# Patient Record
Sex: Male | Born: 1955 | Race: Black or African American | Hispanic: No | Smoking: Former smoker
Health system: Southern US, Community
[De-identification: ages and names within clinical notes are randomized; demographics above are authoritative.]

## PROBLEM LIST (undated history)

## (undated) DIAGNOSIS — Z923 Personal history of irradiation: Secondary | ICD-10-CM

## (undated) DIAGNOSIS — E669 Obesity, unspecified: Secondary | ICD-10-CM

## (undated) DIAGNOSIS — C099 Malignant neoplasm of tonsil, unspecified: Secondary | ICD-10-CM

## (undated) DIAGNOSIS — E7800 Pure hypercholesterolemia, unspecified: Secondary | ICD-10-CM

## (undated) DIAGNOSIS — R011 Cardiac murmur, unspecified: Secondary | ICD-10-CM

## (undated) DIAGNOSIS — N529 Male erectile dysfunction, unspecified: Secondary | ICD-10-CM

## (undated) DIAGNOSIS — I509 Heart failure, unspecified: Secondary | ICD-10-CM

## (undated) DIAGNOSIS — Z8739 Personal history of other diseases of the musculoskeletal system and connective tissue: Secondary | ICD-10-CM

## (undated) DIAGNOSIS — C159 Malignant neoplasm of esophagus, unspecified: Secondary | ICD-10-CM

## (undated) DIAGNOSIS — E119 Type 2 diabetes mellitus without complications: Secondary | ICD-10-CM

## (undated) DIAGNOSIS — R1310 Dysphagia, unspecified: Secondary | ICD-10-CM

## (undated) DIAGNOSIS — M1990 Unspecified osteoarthritis, unspecified site: Secondary | ICD-10-CM

## (undated) DIAGNOSIS — T783XXA Angioneurotic edema, initial encounter: Secondary | ICD-10-CM

## (undated) DIAGNOSIS — K922 Gastrointestinal hemorrhage, unspecified: Secondary | ICD-10-CM

## (undated) DIAGNOSIS — G4733 Obstructive sleep apnea (adult) (pediatric): Secondary | ICD-10-CM

## (undated) DIAGNOSIS — Z9989 Dependence on other enabling machines and devices: Secondary | ICD-10-CM

## (undated) DIAGNOSIS — I10 Essential (primary) hypertension: Secondary | ICD-10-CM

## (undated) HISTORY — DX: Cardiac murmur, unspecified: R01.1

## (undated) HISTORY — DX: Pure hypercholesterolemia, unspecified: E78.00

## (undated) HISTORY — DX: Male erectile dysfunction, unspecified: N52.9

## (undated) HISTORY — DX: Type 2 diabetes mellitus without complications: E11.9

## (undated) HISTORY — DX: Obesity, unspecified: E66.9

## (undated) HISTORY — DX: Heart failure, unspecified: I50.9

## (undated) HISTORY — PX: MULTIPLE TOOTH EXTRACTIONS: SHX2053

## (undated) HISTORY — PX: COLONOSCOPY W/ BIOPSIES AND POLYPECTOMY: SHX1376

## (undated) HISTORY — PX: KNEE ARTHROSCOPY: SHX127

---

## 2010-12-05 ENCOUNTER — Ambulatory Visit: Payer: Self-pay | Admitting: *Deleted

## 2010-12-31 ENCOUNTER — Encounter (HOSPITAL_COMMUNITY)
Admission: RE | Disposition: A | Discharge status: Home / Self Care | Payer: Self-pay | Source: Ambulatory Visit | Attending: Cardiology

## 2010-12-31 ENCOUNTER — Ambulatory Visit (HOSPITAL_COMMUNITY)
Admission: RE | Admit: 2010-12-31 | Discharge: 2010-12-31 | Disposition: A | Discharge status: Home / Self Care | Payer: Medicare Other | Source: Ambulatory Visit | Attending: Cardiology | Admitting: Cardiology

## 2010-12-31 ENCOUNTER — Encounter (HOSPITAL_COMMUNITY): Payer: Self-pay

## 2010-12-31 DIAGNOSIS — I2789 Other specified pulmonary heart diseases: Secondary | ICD-10-CM | POA: Insufficient documentation

## 2010-12-31 DIAGNOSIS — I10 Essential (primary) hypertension: Secondary | ICD-10-CM | POA: Insufficient documentation

## 2010-12-31 DIAGNOSIS — R0602 Shortness of breath: Secondary | ICD-10-CM | POA: Insufficient documentation

## 2010-12-31 DIAGNOSIS — F17200 Nicotine dependence, unspecified, uncomplicated: Secondary | ICD-10-CM | POA: Insufficient documentation

## 2010-12-31 HISTORY — PX: LEFT AND RIGHT HEART CATHETERIZATION WITH CORONARY/GRAFT ANGIOGRAM: SHX5448

## 2010-12-31 LAB — POCT I-STAT 3, ART BLOOD GAS (G3+)
pCO2 arterial: 47.8 mmHg — ABNORMAL HIGH (ref 35.0–45.0)
pH, Arterial: 7.382 (ref 7.350–7.450)
pO2, Arterial: 56 mmHg — ABNORMAL LOW (ref 80.0–100.0)

## 2010-12-31 LAB — POCT I-STAT 3, VENOUS BLOOD GAS (G3P V)
Acid-Base Excess: 3 mmol/L — ABNORMAL HIGH (ref 0.0–2.0)
O2 Saturation: 60 %
pCO2, Ven: 52.8 mmHg — ABNORMAL HIGH (ref 45.0–50.0)

## 2010-12-31 SURGERY — LEFT AND RIGHT HEART CATHETERIZATION WITH CORONARY/GRAFT ANGIOGRAM
Anesthesia: LOCAL

## 2010-12-31 MED ORDER — NITROGLYCERIN 0.2 MG/ML ON CALL CATH LAB
INTRAVENOUS | Status: AC
  Filled 2010-12-31: qty 1

## 2010-12-31 MED ORDER — SODIUM CHLORIDE 0.9 % IV SOLN
1.0000 mL/kg/h | INTRAVENOUS | Status: DC
Start: 2010-12-31 — End: 2010-12-31

## 2010-12-31 MED ORDER — ASPIRIN 81 MG PO CHEW
324.0000 mg | CHEWABLE_TABLET | ORAL | Status: AC
  Administered 2010-12-31: 324 mg via ORAL

## 2010-12-31 MED ORDER — VERAPAMIL HCL 2.5 MG/ML IV SOLN
INTRAVENOUS | Status: AC
  Filled 2010-12-31: qty 2

## 2010-12-31 MED ORDER — HYDROMORPHONE HCL PF 2 MG/ML IJ SOLN
INTRAMUSCULAR | Status: AC
  Filled 2010-12-31: qty 1

## 2010-12-31 MED ORDER — SODIUM CHLORIDE 0.9 % IV SOLN
250.0000 mL | INTRAVENOUS | Status: DC
  Administered 2010-12-31: 1 mL/kg/h via INTRAVENOUS

## 2010-12-31 MED ORDER — MIDAZOLAM HCL 2 MG/2ML IJ SOLN
INTRAMUSCULAR | Status: AC
  Filled 2010-12-31: qty 2

## 2010-12-31 MED ORDER — ONDANSETRON HCL 4 MG/2ML IJ SOLN: 4.0000 mg | Freq: Four times a day (QID) | INTRAMUSCULAR | Status: DC | PRN

## 2010-12-31 MED ORDER — LIDOCAINE HCL (PF) 1 % IJ SOLN
INTRAMUSCULAR | Status: AC
  Filled 2010-12-31: qty 30

## 2010-12-31 MED ORDER — SODIUM CHLORIDE 0.9 % IJ SOLN: 3.0000 mL | INTRAMUSCULAR | Status: DC | PRN

## 2010-12-31 MED ORDER — SODIUM CHLORIDE 0.9 % IV SOLN
250.0000 mL | INTRAVENOUS | Status: DC
Start: 2010-12-31 — End: 2010-12-31

## 2010-12-31 MED ORDER — HEPARIN (PORCINE) IN NACL 2-0.9 UNIT/ML-% IJ SOLN
INTRAMUSCULAR | Status: AC
  Filled 2010-12-31: qty 2000

## 2010-12-31 MED ORDER — ACETAMINOPHEN 325 MG PO TABS: 650.0000 mg | ORAL_TABLET | ORAL | Status: DC | PRN

## 2010-12-31 MED ORDER — CEFAZOLIN SODIUM 1-5 GM-% IV SOLN
1.0000 g | INTRAVENOUS | Status: DC
  Filled 2010-12-31: qty 50

## 2010-12-31 MED ORDER — SODIUM CHLORIDE 0.9 % IJ SOLN: 3.0000 mL | Freq: Two times a day (BID) | INTRAMUSCULAR | Status: DC

## 2010-12-31 MED ORDER — ASPIRIN 81 MG PO CHEW
CHEWABLE_TABLET | ORAL | Status: AC
  Filled 2010-12-31: qty 4

## 2010-12-31 NOTE — Progress Notes (Signed)
Pt unable to find anyone to stay overnight with him. Dr Jacinto Halim made aware and OK to discharge as planned per Dr Jacinto Halim.

## 2010-12-31 NOTE — H&P (Signed)
  Date of Initial H&P: 10/20/2010  History reviewed, patient examined, no change in status, stable for surgery. H&P will be scanned into the system.  Delrae Rend R 12/31/2010

## 2010-12-31 NOTE — Brief Op Note (Signed)
12/31/2010  8:57 AM  PATIENT:  Melvin Hudson  55 y.o. male  PRE-OPERATIVE DIAGNOSIS:  chest pain  POST-OPERATIVE DIAGNOSIS: Microvascular angina. No significant CAD. Mild pulmonary hypertension PROCEDURE:  Procedure(s): LEFT AND RIGHT HEART CATHETERIZATION WITH CORONARY/GRAFT ANGIOGRAM  Cardiologist: Jeanella Cara, MD, [facility].:         DICTATION: .Other Dictation: Dictation Number [id number]   PATIENT DISPOSITION:  Short Stay

## 2010-12-31 NOTE — Cardiovascular Report (Signed)
Melvin Hudson, Melvin Hudson              ACCOUNT NO.:  [id number]  MEDICAL RECORD NO.:  [id number]  LOCATION:  [facility]                         FACILITY:  [facility]  PHYSICIAN:  Pamella Pert, MD DATE OF BIRTH:  [birth year]  DATE OF PROCEDURE: DATE OF DISCHARGE:                           CARDIAC CATHETERIZATION   PROCEDURE PERFORMED: 1. Cardiac catheterization including left heart catheterization. 2. Left ventriculography and selective right and left coronary     angiography. 3. Right heart catheterization with calculation of cardiac output and     cardiac index by Fick.  INDICATIONS:  Mr. Melvin Hudson is a 55 year old gentleman with a history of hypertension, morbid obesity, smoking who has been complaining of shortness of breath and dyspnea on exertion, had undergone outpatient stress testing.  Cardiopulmonary stress testing had revealed cardiac dysfunction with increasing workload.  Given abnormal stress test and the patient's preference, he was brought to the cardiac cath lab to evaluate his coronary anatomy.  Also his right heart catheterization is being performed to evaluate for pulmonary hypertension.  HEMODYNAMIC DATA: 1. Right heart catheterization.     a.     RA pressure of 13/14 with a mean of 10 mmHg.     b.     RV pressure 31/0 with end-diastolic pressure of 0 mmHg.     c.     PA pressure was 34/0 with a mean of 25 mmHg.     d.     Pulmonary capillary wedge pressure was 70/50 with a mean of      14 mmHg.  Cardiac output by Fick was 6.38 with a cardiac index of 2.35.  1. Left heart catheterization.     a.     Left ventricular pressure was 127/18 mmHg.  Aortic pressure      was 114/82 with a mean of 76 mmHg.  There is no pressure gradient      across the aortic valve.  ANGIOGRAPHIC DATA:  Left ventricle.  Left ventricular systolic function was normal with an ejection fraction of 55% to 60%.  Right coronary artery.  Right coronary is a very large caliber vessel and a  dominant vessel.  There is slow filling noted, which improved with intracoronary nitroglycerin administration.  There is no stenosis evident.  Left main coronary artery.  Left main coronary artery is a large-caliber vessel.  It is smooth and normal.  LAD:  LAD is a large-caliber vessel giving origin to several small to moderate sized diagonals which are smooth and normal.  Slow filling again was noted in the LAD.  Circumflex:  Circumflex is a very large-caliber vessel.  It gives origin to 2 large obtuse marginals.  The circumflex itself has very slow filling noted without any significant luminal obstruction.  IMPRESSION: 1. Normal left ventricular systolic function.  Ejection fraction 55%     to 60%. 2. Mild-to-moderate elevation left ventricular end-diastolic pressure     suggestive of diastolic dysfunction. 3. Normal coronary arteries; however, slow filling was evident     suggestive of microvascular disease. 4. Mild pulmonary hypertension.  RECOMMENDATION:  The patient's risk-factor modification including weight loss.  His oxygen saturation was also low at room air at 88%.  He needs to be evaluated for sleep apnea.  He will be discharged home today with outpatient followup.  TECHNIQUE OF PROCEDURE:  Under sterile precautions using a 6-French right radial access and a 5-French right antecubital vein access, I attempted to perform left heart catheterization via radial access. Because of significant tortuosity with double curves into the origin of the subclavian artery and also the arch of the aorta, we decided to abandon this procedure and go via right femoral arterial access.  Right femoral arterial access was obtained using ultrasound guidance.  For performing left heart catheterization, 6-French multipurpose B2 catheter was advanced to the ascending aorta and left ventricle.  Left ventriculography was performed in the RAO projection.  Catheter pulled into the ascending  aorta.  Right coronary artery was selectively engaged and angiography was performed and 200 mcg intracoronary nitroglycerin was administered.  Angiography was repeated.  Catheter then exchanged out to a 6-French Judkins, left 5 diagnostic catheter, and angiography was performed.  The catheter then pulled out of body over a J-wire.  The access on the right side was closed using Angio-Seal with excellent hemostasis.  TECHNIQUE OF RIGHT HEART CATHETERIZATION:  Using antecubital vein access, I was able to place a 5-French brachial sheath.  A 5-French balloon-tipped catheter was easily advanced into the pulmonary capillary wedge position.  Right heart catheterization was performed with careful analysis of the waveforms.  Cardiac output and index was calculated by using Fick.  Then the catheter was pulled out of body in usual fashion. Hemostasis on the right antecubital fossa was obtained by applying manual pressure and the right radial artery.  Hemostasis was obtained by applying TR band.  The patient tolerated the procedure well.  No immediate complications.     Pamella Pert, MD     JRG/MEDQ  D:  12/31/2010  T:  12/31/2010  Job:  [id number]

## 2011-09-17 ENCOUNTER — Encounter (HOSPITAL_COMMUNITY): Payer: Self-pay | Admitting: *Deleted

## 2011-09-17 ENCOUNTER — Observation Stay (HOSPITAL_COMMUNITY)
Admission: EM | Admit: 2011-09-17 | Discharge: 2011-09-18 | Disposition: A | Discharge status: Home / Self Care | Payer: Medicare Other | Attending: Internal Medicine | Admitting: Internal Medicine

## 2011-09-17 DIAGNOSIS — I10 Essential (primary) hypertension: Secondary | ICD-10-CM | POA: Diagnosis not present

## 2011-09-17 DIAGNOSIS — M1710 Unilateral primary osteoarthritis, unspecified knee: Secondary | ICD-10-CM | POA: Insufficient documentation

## 2011-09-17 DIAGNOSIS — Z720 Tobacco use: Secondary | ICD-10-CM | POA: Diagnosis present

## 2011-09-17 DIAGNOSIS — T465X5A Adverse effect of other antihypertensive drugs, initial encounter: Secondary | ICD-10-CM | POA: Insufficient documentation

## 2011-09-17 DIAGNOSIS — M109 Gout, unspecified: Secondary | ICD-10-CM | POA: Diagnosis not present

## 2011-09-17 DIAGNOSIS — F17200 Nicotine dependence, unspecified, uncomplicated: Secondary | ICD-10-CM | POA: Insufficient documentation

## 2011-09-17 DIAGNOSIS — T783XXA Angioneurotic edema, initial encounter: Principal | ICD-10-CM | POA: Diagnosis present

## 2011-09-17 DIAGNOSIS — M19079 Primary osteoarthritis, unspecified ankle and foot: Secondary | ICD-10-CM | POA: Insufficient documentation

## 2011-09-17 DIAGNOSIS — I959 Hypotension, unspecified: Secondary | ICD-10-CM | POA: Diagnosis present

## 2011-09-17 HISTORY — DX: Angioneurotic edema, initial encounter: T78.3XXA

## 2011-09-17 HISTORY — DX: Essential (primary) hypertension: I10

## 2011-09-17 HISTORY — DX: Unspecified osteoarthritis, unspecified site: M19.90

## 2011-09-17 LAB — POCT I-STAT, CHEM 8
BUN: 14 mg/dL (ref 6–23)
Creatinine, Ser: 1.2 mg/dL (ref 0.50–1.35)
Glucose, Bld: 167 mg/dL — ABNORMAL HIGH (ref 70–99)
Hemoglobin: 17.3 g/dL — ABNORMAL HIGH (ref 13.0–17.0)
TCO2: 25 mmol/L (ref 0–100)

## 2011-09-17 LAB — CBC WITH DIFFERENTIAL/PLATELET
Eosinophils Absolute: 0 10*3/uL (ref 0.0–0.7)
Eosinophils Relative: 0 % (ref 0–5)
HCT: 48.6 % (ref 39.0–52.0)
Hemoglobin: 17.6 g/dL — ABNORMAL HIGH (ref 13.0–17.0)
Lymphocytes Relative: 17 % (ref 12–46)
Lymphs Abs: 2 10*3/uL (ref 0.7–4.0)
MCH: 30.2 pg (ref 26.0–34.0)
MCV: 83.4 fL (ref 78.0–100.0)
Monocytes Relative: 4 % (ref 3–12)
RBC: 5.83 MIL/uL — ABNORMAL HIGH (ref 4.22–5.81)
WBC: 11.8 10*3/uL — ABNORMAL HIGH (ref 4.0–10.5)

## 2011-09-17 LAB — MRSA PCR SCREENING: MRSA by PCR: NEGATIVE

## 2011-09-17 LAB — POCT I-STAT TROPONIN I: Troponin i, poc: 0.01 ng/mL (ref 0.00–0.08)

## 2011-09-17 MED ORDER — SODIUM CHLORIDE 0.9 % IJ SOLN
3.0000 mL | Freq: Two times a day (BID) | INTRAMUSCULAR | Status: DC
  Administered 2011-09-17: 3 mL via INTRAVENOUS

## 2011-09-17 MED ORDER — SODIUM CHLORIDE 0.9 % IV SOLN
INTRAVENOUS | Status: AC
  Administered 2011-09-17: 19:00:00 via INTRAVENOUS

## 2011-09-17 MED ORDER — OXYCODONE HCL 5 MG PO TABS: 5.0000 mg | ORAL_TABLET | ORAL | Status: DC | PRN

## 2011-09-17 MED ORDER — SODIUM CHLORIDE 0.9 % IJ SOLN: 3.0000 mL | Freq: Two times a day (BID) | INTRAMUSCULAR | Status: DC

## 2011-09-17 MED ORDER — SODIUM CHLORIDE 0.9 % IJ SOLN: 3.0000 mL | INTRAMUSCULAR | Status: DC | PRN

## 2011-09-17 MED ORDER — FAMOTIDINE IN NACL 20-0.9 MG/50ML-% IV SOLN
20.0000 mg | Freq: Once | INTRAVENOUS | Status: AC
  Administered 2011-09-17: 20 mg via INTRAVENOUS
  Filled 2011-09-17: qty 50

## 2011-09-17 MED ORDER — ACETAMINOPHEN 650 MG RE SUPP: 650.0000 mg | Freq: Four times a day (QID) | RECTAL | Status: DC | PRN

## 2011-09-17 MED ORDER — ONDANSETRON HCL 4 MG PO TABS: 4.0000 mg | ORAL_TABLET | Freq: Four times a day (QID) | ORAL | Status: DC | PRN

## 2011-09-17 MED ORDER — SODIUM CHLORIDE 0.9 % IV SOLN: INTRAVENOUS | Status: DC

## 2011-09-17 MED ORDER — FAMOTIDINE IN NACL 20-0.9 MG/50ML-% IV SOLN
20.0000 mg | Freq: Two times a day (BID) | INTRAVENOUS | Status: DC
  Administered 2011-09-17 – 2011-09-18 (×2): 20 mg via INTRAVENOUS
  Filled 2011-09-17 (×3): qty 50

## 2011-09-17 MED ORDER — METHYLPREDNISOLONE SODIUM SUCC 125 MG IJ SOLR
60.0000 mg | Freq: Four times a day (QID) | INTRAMUSCULAR | Status: DC
  Administered 2011-09-17 – 2011-09-18 (×4): 60 mg via INTRAVENOUS
  Filled 2011-09-17 (×2): qty 2
  Filled 2011-09-17 (×3): qty 0.96
  Filled 2011-09-17: qty 2
  Filled 2011-09-17: qty 0.96

## 2011-09-17 MED ORDER — SODIUM CHLORIDE 0.9 % IV BOLUS (SEPSIS)
500.0000 mL | Freq: Once | INTRAVENOUS | Status: AC
  Administered 2011-09-17: 500 mL via INTRAVENOUS

## 2011-09-17 MED ORDER — DEXAMETHASONE SODIUM PHOSPHATE 10 MG/ML IJ SOLN
10.0000 mg | Freq: Once | INTRAMUSCULAR | Status: AC
  Administered 2011-09-17: 10 mg via INTRAVENOUS
  Filled 2011-09-17: qty 1

## 2011-09-17 MED ORDER — DIPHENHYDRAMINE HCL 50 MG/ML IJ SOLN
12.5000 mg | Freq: Four times a day (QID) | INTRAMUSCULAR | Status: DC
  Administered 2011-09-17 – 2011-09-18 (×4): 12.5 mg via INTRAVENOUS
  Filled 2011-09-17: qty 0.25
  Filled 2011-09-17 (×2): qty 1
  Filled 2011-09-17 (×3): qty 0.25
  Filled 2011-09-17: qty 1

## 2011-09-17 MED ORDER — SODIUM CHLORIDE 0.9 % IV SOLN
INTRAVENOUS | Status: DC
  Administered 2011-09-17: 21:00:00 via INTRAVENOUS

## 2011-09-17 MED ORDER — SODIUM CHLORIDE 0.9 % IV SOLN: 250.0000 mL | INTRAVENOUS | Status: DC | PRN

## 2011-09-17 MED ORDER — ACETAMINOPHEN 325 MG PO TABS: 650.0000 mg | ORAL_TABLET | Freq: Four times a day (QID) | ORAL | Status: DC | PRN

## 2011-09-17 MED ORDER — ALBUTEROL SULFATE (5 MG/ML) 0.5% IN NEBU: 2.5000 mg | INHALATION_SOLUTION | RESPIRATORY_TRACT | Status: DC | PRN

## 2011-09-17 MED ORDER — EPINEPHRINE 0.3 MG/0.3ML IJ DEVI
0.3000 mg | Freq: Once | INTRAMUSCULAR | Status: AC
  Administered 2011-09-17: 0.3 mg via SUBCUTANEOUS
  Filled 2011-09-17: qty 0.3

## 2011-09-17 MED ORDER — MORPHINE SULFATE 2 MG/ML IJ SOLN: 2.0000 mg | INTRAMUSCULAR | Status: DC | PRN

## 2011-09-17 MED ORDER — ONDANSETRON HCL 4 MG/2ML IJ SOLN: 4.0000 mg | Freq: Four times a day (QID) | INTRAMUSCULAR | Status: DC | PRN

## 2011-09-17 NOTE — ED Provider Notes (Signed)
History     CSN: [id number]  Arrival date & time 09/17/11  1112   First MD Initiated Contact with Patient 09/17/11 1115      Chief Complaint  Patient presents with  . Allergic Reaction    (Consider location/radiation/quality/duration/timing/severity/associated sxs/prior treatment) HPI Comments: Melvin Hudson presents for evaluation of lip and tongue swelling and chest tightness when  he woke with this am.  He was walking into his cardiologist's office this morning for a routine visit today when he became lightheaded and started to feel tighter in his chest,  He became diaphoretic and reports he almost passed out.  His blood pressure was found to be 70/40 and was given a 400 ml bolus of fluid along with benadryl and aspirin 324 mg  prior to arrival here by ems.  He denies shortness of breath and chest pain at the time of his initial evaluation here.  He also states he lip and tongue swelling may be slightly better than when he first woke today.  He does state he was switched to a new bp medicine edarbyclor about 1 month ago.  He denies any other new exposures to medicines.    The history is provided by the patient.    Past Medical History  Diagnosis Date  . Hypertension   . Anginal pain   . Angioedema 09/17/11    tongue and lips  . Arthritis     "both of my legs and feet"  . History of gout     Past Surgical History  Procedure Date  . Knee arthroscopy ~ 1994    right  . Multiple tooth extractions ~ 2008    History reviewed. No pertinent family history.  History  Substance Use Topics  . Smoking status: Current Everyday Smoker -- 40 years    Types: Cigars  . Smokeless tobacco: Not on file  . 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  HENT: Negative for congestion, sore throat, drooling, trouble swallowing, neck pain and voice change.   Eyes: Negative.   Respiratory: Negative for chest tightness,  shortness of breath, wheezing and stridor.   Cardiovascular: Negative for chest pain.  Gastrointestinal: Negative for nausea and abdominal pain.  Musculoskeletal: Negative for joint swelling and arthralgias.  Skin: Negative.  Negative for rash and wound.  Neurological: Negative for dizziness, weakness, light-headedness, numbness and headaches.  Hematological: Negative.   Psychiatric/Behavioral: Negative.     Allergies  Advil  Home Medications   Current Outpatient Rx  Name Route Sig Dispense Refill  . AMLODIPINE BESYLATE 10 MG PO TABS Oral Take 10 mg by mouth daily.    . ASPIRIN EC 81 MG PO TBEC Oral Take 81 mg by mouth daily.      . AZILSARTAN-CHLORTHALIDONE 40-25 MG PO TABS Oral Take by mouth.      BP 121/77  Pulse 79  Temp 98.7 F (37.1 C) (Oral)  Resp 22  SpO2 98%  Physical Exam  Nursing note and vitals reviewed. Constitutional: He appears well-developed and well-nourished.  HENT:  Head: Normocephalic and atraumatic.  Nose: No mucosal edema.       Tongue and lower lip edema with no involvement of uvula or soft palate.    Eyes: Conjunctivae are normal.  Neck: Normal range of motion.  Cardiovascular: Normal rate, regular rhythm, normal heart sounds and intact distal pulses.   Pulmonary/Chest: Effort normal and breath sounds normal.  No stridor. No respiratory distress. He has no wheezes. He has no rales.  Abdominal: Soft. Bowel sounds are normal. There is no tenderness.  Musculoskeletal: Normal range of motion. He exhibits no edema.  Neurological: He is alert.  Skin: Skin is warm and dry.  Psychiatric: He has a normal mood and affect.    ED Course  Procedures (including critical care time)  Labs Reviewed  CBC WITH DIFFERENTIAL - Abnormal; Notable for the following:    WBC 11.8 (*)     RBC 5.83 (*)     Hemoglobin 17.6 (*)     MCHC 36.2 (*)     RDW 19.3 (*)     Neutrophils Relative 79 (*)     Neutro Abs 9.3 (*)     All other components within normal limits    POCT I-STAT, CHEM 8 - Abnormal; Notable for the following:    Glucose, Bld 167 (*)     Hemoglobin 17.3 (*)     All other components within normal limits  POCT I-STAT TROPONIN I   No results found.   1. Angioedema of lips       MDM  Angioedema of unclear etiology.  Pt also seen by Dr. Ignacia Palma and admission for continued observation arranged.        Burgess Amor, [place] 09/17/11 [phone]

## 2011-09-17 NOTE — Progress Notes (Addendum)
Melvin Hudson, is a 56 y.o. male,   MRN: [id number]  -  DOB - 06/25/1955  Outpatient Primary MD for the patient is OSEI-BONSU,GEORGE, MD Cardiologist:  Dr. Evlyn Courier  Chief Complaint:    Chief Complaint  Patient presents with  . Allergic Reaction     Blood pressure 103/63, pulse 71, temperature 97.9 F (36.6 C), temperature source Oral, resp. rate 22, SpO2 94.00%.  Active Problems:  Angio-edema  Dehydration  hypotension    [name] was sent to the ED from Dr. Jodi Marble office after a presyncopal episode he was found to have SBP of 60.  He reports that Dr. Reece Agar put him on new medications 28 days ago (he can't tell me the name of the medications). Per the EDP he received a new ARB medication.   He was in Dr. Timoteo Expose office this morning for a follow up.  This morning the patient developed a rash on his right arm with itching on his arm and neck.  He felt well before this morning.  In the ED the patient has received fluids, decadron, epinephrine and pecid.  His blood pressure has improved but is still soft.  He is awake and alert but his tongue is very swollen.  I have requested and observation admission in a step down bed.    Of note Hgb, HCT, and Creatinine are elevated.  I will continue IVF for dehydration.   Algis Downs, PA-C [facility] Pager: [phone]

## 2011-09-17 NOTE — ED Notes (Signed)
Per EMS- pt was at pcp office for a follow up visit and EMS was called out. Pt was pale diaphoretic itching and reported tongue swelling. Pt reported tightness in chest as well. BP 70/40 initially hr 82 resp 18. 400 ml bolus of NS. BP100/70. 50 mg benadryl PO given. Denies itching. Tightness in chest has also decreased from an 8 to 2. Pt also received 324 of Asprin en route. 18g in left hand

## 2011-09-17 NOTE — H&P (Signed)
PCP:   Jackie Plum, MD   Chief Complaint:  Pruritus and swelling of lips/tongue.  HPI: This is a 56 year old male, with known history of morbid obesity, cigar smoking, HTN, angioedema in childhood due to a "fish sting", gout, arthritis, s/p post right knee arthroscopy, s/p cardiac catheterization 12/31/2010, for chest pain which revealed no  significant CAD, but mild pulmonary hypertension. According to patient, he was placed on 2 new antihypertensives by Dr Jacinto Halim, approximately 28 days ago, and has been compliant, without any adverse effects. This AM, he got ready for a routine follow up visit with Dr Jacinto Halim, felt fine, took his medication at about 9:00 AM, then ate a banana, took some vinegar and waited for his ride. Soon after, he became itchy in both arms and neck, and his face felt "numb" and his lips started swelling. He had no difficulty swallowing, and was able to make it to Dr Verl Dicker office. On getting out of the care, he became very dizzy, managed to get into the elevator, but as soon as he arrived at the office on the 3rd floor, he had to sit in a chair, and ask for help. His SBP was found to be in the 60s, and he was sent to the ED.  In the ED the patient received fluids, decadron, epinephrine and pecid.     Allergies:   Allergies  Allergen Reactions  . Advil (Ibuprofen)     Feels "jittery"      Past Medical History  Diagnosis Date  . Hypertension   . Anginal pain   . Angioedema 09/17/11    tongue and lips  . Arthritis     "both of my legs and feet"  . History of gout     Past Surgical History  Procedure Date  . Knee arthroscopy ~ 1994    right  . Multiple tooth extractions ~ 2008    Prior to Admission medications   Medication Sig Start Date End Date Taking? Authorizing Provider  amLODipine (NORVASC) 10 MG tablet Take 10 mg by mouth daily.   Yes Historical Provider, MD  aspirin EC 81 MG tablet Take 81 mg by mouth daily.     Yes Historical Provider, MD    Azilsartan-Chlorthalidone (EDARBYCLOR) 40-25 MG TABS Take by mouth.   Yes Historical Provider, MD    Social History: Patient is single, has no offspring, and is on disability. He reports that he has been smoking Cigars about 2/day, since age 81 years.  He does not have any smokeless tobacco history on file. Denies alcohol use. He reports that he does not use illicit drugs.  History reviewed. No pertinent family history.  Review of Systems:  As per HPI and chief complaint. Patent denies fatigue, diminished appetite, weight loss, fever, chills, headache, blurred vision, speech is a bit slurred, denies, dyspahgia, chest pain, cough, shortness of breath, orthopnea, paroxysmal nocturnal dyspnea, nausea, diaphoresis, abdominal pain, vomiting, diarrhea, belching, heartburn, hematemesis, melena, dysuria, nocturia, urinary frequency, hematochezia, lower extremity swelling, pain, or redness. The rest of the systems review is negative.  Physical Exam:  General:  Patient does not appear to be in obvious acute distress at the time of this evaluation, alert, communicative, fully oriented, talking in complete sentences, not short of breath at rest. Speech is mildly slurred. HEENT:  No clinical pallor, no jaundice, no conjunctival injection or discharge. Lips are visible swollen, tongue appears only mildly swollen, but does not seem to impede respiration or swallowing. Hydration status appears fair.  NECK:  Supple, JVP not seen, no carotid bruits, no palpable lymphadenopathy, no palpable goiter. CHEST:  Clinically clear to auscultation, no wheezes, no crackles. HEART:  Sounds 1 and 2 heard, normal, regular, no murmurs. ABDOMEN:  Morbidly obese, soft, non-tender, no palpable organomegaly, no palpable masses, normal bowel sounds. GENITALIA:  Not examined. LOWER EXTREMITIES:  No pitting edema, palpable peripheral pulses. MUSCULOSKELETAL SYSTEM:  Unremarkable. CENTRAL NERVOUS SYSTEM:  No focal neurologic deficit  on gross examination.  Labs on Admission:  Results for orders placed during the hospital encounter of 09/17/11 (from the past 48 hour(s))  CBC WITH DIFFERENTIAL     Status: Abnormal   Collection Time   09/17/11 12:42 PM      Component Value Range Comment   WBC 11.8 (*) 4.0 - 10.5 K/uL    RBC 5.83 (*) 4.22 - 5.81 MIL/uL    Hemoglobin 17.6 (*) 13.0 - 17.0 g/dL    HCT 47.8  29.5 - 62.1 %    MCV 83.4  78.0 - 100.0 fL    MCH 30.2  26.0 - 34.0 pg    MCHC 36.2 (*) 30.0 - 36.0 g/dL    RDW 30.8 (*) 65.7 - 15.5 %    Platelets 298  150 - 400 K/uL    Neutrophils Relative 79 (*) 43 - 77 %    Neutro Abs 9.3 (*) 1.7 - 7.7 K/uL    Lymphocytes Relative 17  12 - 46 %    Lymphs Abs 2.0  0.7 - 4.0 K/uL    Monocytes Relative 4  3 - 12 %    Monocytes Absolute 0.5  0.1 - 1.0 K/uL    Eosinophils Relative 0  0 - 5 %    Eosinophils Absolute 0.0  0.0 - 0.7 K/uL    Basophils Relative 0  0 - 1 %    Basophils Absolute 0.0  0.0 - 0.1 K/uL   POCT I-STAT TROPONIN I     Status: Normal   Collection Time   09/17/11 12:51 PM      Component Value Range Comment   Troponin i, poc 0.01  0.00 - 0.08 ng/mL    Comment 3            POCT I-STAT, CHEM 8     Status: Abnormal   Collection Time   09/17/11 12:52 PM      Component Value Range Comment   Sodium 142  135 - 145 mEq/L    Potassium 3.5  3.5 - 5.1 mEq/L    Chloride 102  96 - 112 mEq/L    BUN 14  6 - 23 mg/dL    Creatinine, Ser 8.46  0.50 - 1.35 mg/dL    Glucose, Bld 962 (*) 70 - 99 mg/dL    Calcium, Ion 9.52  8.41 - 1.23 mmol/L    TCO2 25  0 - 100 mmol/L    Hemoglobin 17.3 (*) 13.0 - 17.0 g/dL    HCT 32.4  40.1 - 02.7 %     Radiological Exams on Admission: No results found.  Assessment/Plan Active Problems: 1. Angio-edema: Patient presented with angioedema of lips and tongue, preceded by pruritus of arms and neck, after ingestion of his regular antihypertensives. The likely culprit is ARB, Azilsartan. This has been discontinued, and patient will be  cautioned to avoid ACE-i and ARBs, in the future. He appears to have responded to Decadron, epinephrine and Pecid, administered in the ED and lip swelling is already visibly less. There appears to  be no respiratory compromise at this time. We shall admit patient to SDU for close observation. He is tolerating liquids a tthis time, which we shall continue. Meanwhile, continue steroids, H2RA and antihistaminics.  2. Hypotension: This was likely due to peripheral vasodilatation, associated with a severe al;lergic reaction., as well as the super-added effect of antihypertensives and diuretic. BP is now normal at 116/70 in the ED, after saline bolus. Continue maintenance iv fluids, and hold antihypertensives.  3. History of HTN; see discussion above. 4. Gout: asymptomatic. 5. Tobacco abuse: counseled.    Time Spent on Admission: 45 mins.   Dorethia Jeanmarie,CHRISTOPHER 09/17/2011, 4:48 PM

## 2011-09-17 NOTE — ED Provider Notes (Signed)
Medical screening examination/treatment/procedure(s) were conducted as a shared visit with non-physician practitioner(s) and myself.  I personally evaluated the patient during the encounter 56 yo man had angioedema of tongue and lips and hypotension at Dr. Verl Dicker office. Exam showed the tongue and lips quite swollen, BP low. Given epinephrine, Benadryl, Solumedrol and Pepcid. BP is up and swelling is going down. Call to [facility], [facility]C for [facility], to admit to a telemetry bed to [facility].  Ultimately it was decided to admit pt to a stepdown bed, as discussed with Mrs. Elyn Peers.    Carleene Cooper III, MD 09/17/11 2013

## 2011-09-17 NOTE — ED Provider Notes (Signed)
11:48 AM  Date: 09/17/2011  Rate: 73  Rhythm: normal sinus rhythm  QRS Axis: left  Intervals: PR prolonged QRS:  Low voltage; early precordial R/S transition.  ST/T Wave abnormalities: normal  Conduction Disutrbances:none  Narrative Interpretation: Borderline EKG  Old EKG Reviewed: none available    Carleene Cooper III, MD 09/17/11 1149

## 2011-09-17 NOTE — Progress Notes (Signed)
56 yo man had angioedema of tongue and lips and hypotension at Dr. Verl Dicker office.  Exam showed the tongue and lips quite swollen, BP low.  Given epinephrine, Benadryl, Solumedrol and Pepcid.  BP is up and swelling is going down.  Call to [facility], [facility].-C for [facility], to admit to a telemetry bed to [facility].

## 2011-09-18 DIAGNOSIS — T783XXA Angioneurotic edema, initial encounter: Secondary | ICD-10-CM

## 2011-09-18 DIAGNOSIS — M109 Gout, unspecified: Secondary | ICD-10-CM

## 2011-09-18 DIAGNOSIS — F17200 Nicotine dependence, unspecified, uncomplicated: Secondary | ICD-10-CM

## 2011-09-18 DIAGNOSIS — I10 Essential (primary) hypertension: Secondary | ICD-10-CM

## 2011-09-18 LAB — CBC
Hemoglobin: 15.1 g/dL (ref 13.0–17.0)
MCH: 29.4 pg (ref 26.0–34.0)
RBC: 5.13 MIL/uL (ref 4.22–5.81)
WBC: 10.3 10*3/uL (ref 4.0–10.5)

## 2011-09-18 LAB — BASIC METABOLIC PANEL
GFR calc Af Amer: >90 mL/min (ref >90)
GFR calc non Af Amer: >90 mL/min (ref >90)
Glucose, Bld: 213 mg/dL — ABNORMAL HIGH (ref 70–99)
Potassium: 3.6 mEq/L (ref 3.5–5.1)
Sodium: 139 mEq/L (ref 135–145)

## 2011-09-18 MED ORDER — PREDNISONE (PAK) 10 MG PO TABS: 40.0000 mg | ORAL_TABLET | Freq: Every day | ORAL | Status: AC

## 2011-09-18 MED ORDER — FAMOTIDINE 20 MG PO TABS: 20.0000 mg | ORAL_TABLET | Freq: Two times a day (BID) | ORAL | Status: DC

## 2011-09-18 MED ORDER — SPIRONOLACTONE 25 MG PO TABS
25.0000 mg | ORAL_TABLET | Freq: Once | ORAL | Status: AC
  Administered 2011-09-18: 25 mg via ORAL
  Filled 2011-09-18: qty 1

## 2011-09-18 MED ORDER — DIPHENHYDRAMINE HCL 25 MG PO TABS: 25.0000 mg | ORAL_TABLET | Freq: Four times a day (QID) | ORAL | Status: DC | PRN

## 2011-09-18 MED ORDER — SPIRONOLACTONE 25 MG PO TABS: 25.0000 mg | ORAL_TABLET | Freq: Once | ORAL | Status: DC

## 2011-09-18 MED ORDER — AMLODIPINE BESYLATE 10 MG PO TABS
10.0000 mg | ORAL_TABLET | Freq: Every day | ORAL | Status: DC
  Administered 2011-09-18: 10 mg via ORAL
  Filled 2011-09-18: qty 1

## 2011-09-18 NOTE — Discharge Summary (Signed)
Physician Discharge Summary  Melvin Hudson XLK:[id number] DOB: Apr 08, 1955 DOA: 09/17/2011  PCP: Jackie Plum, MD  Admit date: 09/17/2011 Discharge date: 09/18/2011  Recommendations for Outpatient Follow-up:  F/u BP and bmet at Dr Verl Dicker or Dr Luna Kitchens office in 1 week  Discharge Diagnoses:  Active Problems:  Angio-edema and related hypotension   HTN (hypertension)  Gout  Tobacco abuse Morbid obesity   Discharge Condition: stable  Diet recommendation: low sodium, low fat  Wt Readings from Last 3 Encounters:  09/17/11 166.1 kg (366 lb 2.9 oz)  12/31/10 157.852 kg (348 lb)  12/31/10 157.852 kg (348 lb)    History of present illness:  This is a 56 year old male, with known history of morbid obesity, cigar smoking, HTN, angioedema in childhood due to a "fish sting", gout, arthritis, s/p post right knee arthroscopy, s/p cardiac catheterization 12/31/2010, for chest pain which revealed no significant CAD, but mild pulmonary hypertension. According to patient, he was placed on 2 new antihypertensives by Dr Jacinto Halim, approximately 28 days ago, and has been compliant, without any adverse effects. This AM, he got ready for a routine follow up visit with Dr Jacinto Halim, felt fine, took his medication at about 9:00 AM, then ate a banana, took some vinegar and waited for his ride. Soon after, he became itchy in both arms and neck, and his face felt "numb" and his lips started swelling. He had no difficulty swallowing, and was able to make it to Dr Verl Dicker office. On getting out of the care, he became very dizzy, managed to get into the elevator, but as soon as he arrived at the office on the 3rd floor, he had to sit in a chair, and ask for help. His SBP was found to be in the 60s, and he was sent to the ED.  In the ED the patient received fluids, decadron, epinephrine and pecid.   Hospital Course:  Angioedema Improved significantly with Steroids, Pepcid and Benadryl. Will give him 3 more days of  these. He is to no longer take ACE I or ARBs.  HTN-  Initially hypotenisve on admission likely anaphylaxis. BP up now likely from steroids, IVF and holding his BP meds. Will resume Norvasc and start Spironolactone per Dr Verl Dicker recommendations. He will follow up with the patient later in the week.   Discharge Exam: Filed Vitals:   09/18/11 1232  BP: 163/82  Pulse: 93  Temp: 98.9 F (37.2 C)  Resp: 22   Filed Vitals:   09/18/11 0500 09/18/11 0600 09/18/11 0804 09/18/11 1232  BP: 144/87 134/80 143/86 163/82  Pulse: 89 86 97 93  Temp:   98.4 F (36.9 C) 98.9 F (37.2 C)  TempSrc:   Oral Oral  Resp: 18 22 20 22   Height:      Weight:      SpO2:   95% 95%    General: morbidly obese, no acute distress Cardiovascular: RRR, unable to hear any murmurs Respiratory: decreased breath sounds but no crackles, ronchi or wheeze  Discharge Instructions   Medication List  As of 09/18/2011  4:12 PM   STOP taking these medications         EDARBYCLOR 40-25 MG Tabs         TAKE these medications         amLODipine 10 MG tablet   Commonly known as: NORVASC   Take 10 mg by mouth daily.      aspirin EC 81 MG tablet   Take 81 mg by mouth  daily.      diphenhydrAMINE 25 MG tablet   Commonly known as: BENADRYL   Take 1 tablet (25 mg total) by mouth every 6 (six) hours as needed for itching.      famotidine 20 MG tablet   Commonly known as: PEPCID   Take 1 tablet (20 mg total) by mouth 2 (two) times daily.      predniSONE 10 MG tablet   Commonly known as: STERAPRED UNI-PAK   Take 4 tablets (40 mg total) by mouth daily. Start tomorrow morning and take one daily for 3 days   Start taking on: 09/19/2011      spironolactone 25 MG tablet   Commonly known as: ALDACTONE   Take 1 tablet (25 mg total) by mouth once.              The results of significant diagnostics from this hospitalization (including imaging, microbiology, ancillary and laboratory) are listed below for reference.      Significant Diagnostic Studies: No results found.  Microbiology: Recent Results (from the past 240 hour(s))  MRSA PCR SCREENING     Status: Normal   Collection Time   09/17/11  6:12 PM      Component Value Range Status Comment   MRSA by PCR NEGATIVE  NEGATIVE Final      Labs: Basic Metabolic Panel:  Lab 09/18/11 1610 09/17/11 1252  NA 139 142  K 3.6 3.5  CL 102 102  CO2 26 --  GLUCOSE 213* 167*  BUN 13 14  CREATININE 0.89 1.20  CALCIUM 9.1 --  MG -- --  PHOS -- --   Liver Function Tests: No results found for this basename: AST:5,ALT:5,ALKPHOS:5,BILITOT:5,PROT:5,ALBUMIN:5 in the last 168 hours No results found for this basename: LIPASE:5,AMYLASE:5 in the last 168 hours No results found for this basename: AMMONIA:5 in the last 168 hours CBC:  Lab 09/18/11 0436 09/17/11 1252 09/17/11 1242  WBC 10.3 -- 11.8*  NEUTROABS -- -- 9.3*  HGB 15.1 17.3* 17.6*  HCT 42.7 51.0 48.6  MCV 83.2 -- 83.4  PLT 284 -- 298   Cardiac Enzymes: No results found for this basename: CKTOTAL:5,CKMB:5,CKMBINDEX:5,TROPONINI:5 in the last 168 hours BNP: BNP (last 3 results) No results found for this basename: PROBNP:3 in the last 8760 hours CBG: No results found for this basename: GLUCAP:5 in the last 168 hours  Time coordinating discharge: >45 minutes  Signed:  Vern Guerette  [facility] 09/18/2011, 4:12 PM

## 2011-09-18 NOTE — Care Management Note (Signed)
  Page 1 of 1   09/18/2011     1:39:20 PM   CARE MANAGEMENT NOTE 09/18/2011  Patient:  Melvin Hudson, Melvin Hudson   Account Number:  [id number]  Date Initiated:  09/18/2011  Documentation initiated by:  Alvira Philips Assessment:   56 yr-old male adm with dx of angioedema and hypotension; lives alone, assisted with IADLs.        DC Planning Services  CM consult      Comments:  PCP:  Dr. Greggory Stallion Osei-Bonsu  Contact:  Andria Rhein, niece [phone]  09/18/11 1050 Lacresia Darwish RN MSN CCM Per pt, he lives in apt and has a friend nearby who checks on him qday.  Wears Life Alert necklace.  Ambulates with a cane and uses [facility] transportation for MD appts, usually rides the bus or walks to the grocery store.  [place], also transports him @ times.  Niece checks on him, helps him with medication refills, but doesn't always pick them up in a timely manner.  Provided name of pharmacy that delivers, he plans to change to that pharmacy so he doesn't have to rely on niece.  Pt is not interested in exploring assisted living @ present - feels he has sufficient support to stay in his apt.

## 2011-09-18 NOTE — Progress Notes (Signed)
Inpatient Diabetes Program Recommendations  AACE/ADA: New Consensus Statement on Inpatient Glycemic Control  Target Ranges:  Prepandial:   less than 140 mg/dL      Peak postprandial:   less than 180 mg/dL (1-2 hours)      Critically ill patients:  140 - 180 mg/dL  Pager:  [phone] Hours:  8 am-10pm   Reason for Visit: Steroid induced Hyperglycemia: 213 mg/dL  Inpatient Diabetes Program Recommendations  Correction (SSI): Add Novolog Resistant Correction   HgbA1C: Check HgbA1C to assess glycemic control  Alfredia Client PhD, RN Diabetes Coordinator  Office:  [phone] Team Pager:  [phone]

## 2011-10-23 ENCOUNTER — Institutional Professional Consult (permissible substitution): Payer: Medicare Other | Admitting: Pulmonary Disease

## 2011-11-18 ENCOUNTER — Encounter: Payer: Self-pay | Admitting: Pulmonary Disease

## 2011-11-19 ENCOUNTER — Ambulatory Visit (INDEPENDENT_AMBULATORY_CARE_PROVIDER_SITE_OTHER): Payer: Medicare Other | Admitting: Pulmonary Disease

## 2011-11-19 ENCOUNTER — Encounter: Payer: Self-pay | Admitting: Pulmonary Disease

## 2011-11-19 VITALS — BP 122/88 | HR 67 | Temp 98.6°F | Ht 74.0 in | Wt 361.2 lb

## 2011-11-19 DIAGNOSIS — G4733 Obstructive sleep apnea (adult) (pediatric): Secondary | ICD-10-CM

## 2011-11-19 NOTE — Patient Instructions (Addendum)
Will schedule for a sleep study, and try to have someone there early for your arrival if possible. Work on [employer] loss Will arrange followup once results available.

## 2011-11-19 NOTE — Assessment & Plan Note (Signed)
The patient's history is very suggestive of clinically significant sleep apnea.  He is morbidly obese, has loud snoring, as well as choking arousals at night.  He has significant sleep pressure during the day, and is morbidly obese.  I have had a long discussion with him about the pathophysiology of sleep apnea, including its impact to his quality of life and cardiovascular health.  At this point, I would recommend a sleep study, and the patient is agreeable.

## 2011-11-19 NOTE — Progress Notes (Signed)
  Subjective:    Patient ID: Melvin Hudson, male    DOB: 1956/01/06, 56 y.o.   MRN: [id number]  HPI The patient is a 56 year old male who I've been asked to see for possible obstructive sleep apnea.  He has been noted to have loud snoring by his next door neighbor, but does not have a bed partner currently who can comment on an abnormal breathing pattern during sleep.  He does note fairly frequent choking arousals during the night.  He has frequent awakenings at night, and is unsure if he is rested or not when he arises.  He notes definite sleep pressure during the day while reading or watching television, and can fall asleep easily.  He denies any issues in the evening watching television, but has napped quite a bit during that day.  The patient currently does not drive.  The patient states that his weight is up about 16 pounds over the last year, and his Epworth score today is 8.  Sleep Questionnaire: What time do you typically go to bed?( Between what hours) 1- 2 am How long does it take you to fall asleep? not long How many times during the night do you wake up? What time do you get out of bed to start your day? 0700 Do you drive or operate heavy machinery in your occupation? No How much has your weight changed (up or down) over the past two years? (In pounds) 16 lb (7.258 kg) Have you ever had a sleep study before? No Do you currently use CPAP? No Do you wear oxygen at any time? No    Review of Systems  Constitutional: Negative for fever and unexpected weight change.  HENT: Positive for congestion, sore throat and rhinorrhea. Negative for ear pain, nosebleeds, sneezing, trouble swallowing, dental problem, postnasal drip and sinus pressure.   Eyes: Negative for redness and itching.  Respiratory: Positive for shortness of breath and wheezing. Negative for cough and chest tightness.   Cardiovascular: Negative for palpitations and leg swelling.  Gastrointestinal: Negative for nausea and vomiting.    Genitourinary: Negative for dysuria.  Musculoskeletal: Positive for joint swelling.  Skin: Negative for rash.  Neurological: Negative for headaches.  Hematological: Bruises/bleeds easily.  Psychiatric/Behavioral: Negative for dysphoric mood. The patient is not nervous/anxious.        Objective:   Physical Exam Constitutional:  Morbidly obese male, no acute distress  HENT:  Nares patent without discharge  Oropharynx without exudate, palate and uvula are thick and elongated.   Eyes:  Perrla, eomi, no scleral icterus  Neck:  No JVD, no TMG  Cardiovascular:  Normal rate, regular rhythm, no rubs or gallops.  No murmurs        Intact distal pulses  Pulmonary :  Normal breath sounds, no stridor or respiratory distress   No rales, rhonchi, or wheezing  Abdominal:  Soft, nondistended, bowel sounds present.  No tenderness noted.   Musculoskeletal:  mild lower extremity edema noted.  Lymph Nodes:  No cervical lymphadenopathy noted  Skin:  No cyanosis noted  Neurologic:  Appears sleepy, appropriate, moves all 4 extremities without obvious deficit.         Assessment & Plan:

## 2011-12-10 ENCOUNTER — Ambulatory Visit (HOSPITAL_BASED_OUTPATIENT_CLINIC_OR_DEPARTMENT_OTHER): Payer: Medicare Other | Attending: Pulmonary Disease | Admitting: Radiology

## 2011-12-10 VITALS — Ht 73.0 in | Wt 360.0 lb

## 2011-12-10 DIAGNOSIS — G4733 Obstructive sleep apnea (adult) (pediatric): Secondary | ICD-10-CM

## 2011-12-24 ENCOUNTER — Telehealth: Payer: Self-pay | Admitting: Pulmonary Disease

## 2011-12-24 NOTE — Telephone Encounter (Signed)
Patient wanting sleep study results. Patient scheduled appt for 01/09/12 to 11:15 with [facility] to review.  Sleep Study to be faxed to our office from [facility] for [facility] review.

## 2011-12-30 ENCOUNTER — Telehealth: Payer: Self-pay | Admitting: Pulmonary Disease

## 2011-12-30 DIAGNOSIS — G4730 Sleep apnea, unspecified: Secondary | ICD-10-CM

## 2011-12-30 DIAGNOSIS — G4710 Hypersomnia, unspecified: Secondary | ICD-10-CM

## 2011-12-30 NOTE — Telephone Encounter (Signed)
Pt returned Lori's call.  Pt all ready has an appt set w/ KC for 01/09/12 @ 11:15 am.  Pt verbalized understanding & stated nothing further needed at this time.  Antionette Fairy

## 2011-12-30 NOTE — Telephone Encounter (Signed)
Pt needs an OV with KC to discuss sleep results. We can schedule the pt in any open skit or use the hold slot at 4:30, any day but Mondays. [id number] x1.

## 2011-12-30 NOTE — Procedures (Signed)
Melvin Hudson, Melvin Hudson              ACCOUNT NO.:  [id number]  MEDICAL RECORD NO.:  [id number]          PATIENT TYPE:  OUT  LOCATION:  SLEEP CENTER                 FACILITY[facility]  [facility]  PHYSICIAN:  Barbaraann Share, MD,FCCPDATE OF BIRTH:  1955-07-27  DATE OF STUDY:  12/10/2011                           NOCTURNAL POLYSOMNOGRAM  REFERRING PHYSICIAN:  Barbaraann Share, MD,FCCP  LOCATION:  Sleep Lab.  INDICATION FOR STUDY:  Hypersomnia with sleep apnea.  EPWORTH SLEEPINESS SCORE:  9.  MEDICATIONS:  SLEEP ARCHITECTURE:  The patient had a total sleep time of 279 minutes with very little slow-wave sleep and only 15 minutes of REM.  Sleep onset latency was normal at 25 minutes and REM onset was prolonged at 330 minutes.  Sleep efficiency was poor at 71%.  RESPIRATORY DATA:  The patient was found to have 5 apneas and 197 obstructive hypopneas, giving him an apnea-hypopnea index of 43 events per hour.  The events occurred in all body positions and there was loud snoring noted throughout.  OXYGEN DATA:  There was O2 desaturation as low as 77% with the patient's obstructive events.  CARDIAC DATA:  Rare PAC and PVC noted, but no clinically significant arrhythmias were seen.  MOVEMENT-PARASOMNIA:  The patient had no significant leg jerks or other abnormal behaviors noted.  IMPRESSIONS-RECOMMENDATIONS: 1. Severe obstructive sleep apnea/hypopnea syndrome with an AHI of 43     events per hour and oxygen desaturation as low as 77%.  Treatment     for this degree of sleep apnea should focus primarily on CPAP while     working on weight loss. 2. Rare premature atrial contraction and premature ventricular     contraction noted, but no clinically significant arrhythmias were     seen.     Barbaraann Share, MD,FCCP Diplomate, American Board of Sleep Medicine    [facility]/MED[facility]  D:  12/30/2011 07:48:12  T:  12/30/2011 22:37:01  Job:  [id number]

## 2012-01-09 ENCOUNTER — Encounter: Payer: Self-pay | Admitting: Pulmonary Disease

## 2012-01-09 ENCOUNTER — Ambulatory Visit (INDEPENDENT_AMBULATORY_CARE_PROVIDER_SITE_OTHER): Payer: Medicare Other | Admitting: Pulmonary Disease

## 2012-01-09 VITALS — BP 138/90 | HR 72 | Temp 98.4°F | Ht 73.5 in | Wt 359.4 lb

## 2012-01-09 DIAGNOSIS — G4733 Obstructive sleep apnea (adult) (pediatric): Secondary | ICD-10-CM

## 2012-01-09 NOTE — Progress Notes (Signed)
  Subjective:    Patient ID: Melvin Hudson, male    DOB: [birth year], 56 y.o.   MRN: [id number]  HPI The patient comes today for followup of his recent sleep study.  He was found to have severe objective sleep apnea, with an AHI of 43 events per hour and oxygen desaturation as low as 77%.  I have reviewed the study with him in detail, and answered all of his questions.   Review of Systems  Constitutional: Negative for fever and unexpected weight change.  HENT: Negative for ear pain, nosebleeds, congestion, sore throat, rhinorrhea, sneezing, trouble swallowing, dental problem, postnasal drip and sinus pressure.   Eyes: Negative for redness and itching.  Respiratory: Negative for cough, chest tightness, shortness of breath and wheezing.   Cardiovascular: Negative for palpitations and leg swelling.  Gastrointestinal: Negative for nausea and vomiting.  Genitourinary: Negative for dysuria.  Musculoskeletal: Negative for joint swelling.  Skin: Negative for rash.  Neurological: Negative for headaches.  Hematological: Does not bruise/bleed easily.  Psychiatric/Behavioral: Negative for dysphoric mood. The patient is not nervous/anxious.        Objective:   Physical Exam Morbidly obese male in no acute distress Nose without purulence or discharge noted Lower extremities with significant edema, no cyanosis Alert and oriented, moves all 4 extremities.        Assessment & Plan:

## 2012-01-09 NOTE — Assessment & Plan Note (Signed)
The patient has severe obstructive sleep apnea by his recent sleep study, and will need to be started on CPAP.  I have also encouraged him to work aggressively on weight loss. I will set the patient up on cpap at a moderate pressure level to allow for desensitization, and will troubleshoot the device over the next 4-6weeks if needed.  The pt is to call me if having issues with tolerance.  Will then optimize the pressure once patient is able to wear cpap on a consistent basis.

## 2012-01-09 NOTE — Patient Instructions (Addendum)
Will start on cpap at a moderate pressure level.  Please call if having issues with tolerance Work on weight loss followup with me in 6weeks.  

## 2012-01-14 ENCOUNTER — Telehealth: Payer: Self-pay | Admitting: Pulmonary Disease

## 2012-01-14 NOTE — Telephone Encounter (Signed)
Called and spoke with patient and [facility] did call patient and scheduled an appointment for Monday 01/19/12 to set up cpap. Nothing else needed. Rhonda J Cobb

## 2012-01-14 NOTE — Telephone Encounter (Signed)
Called and spoke with Steward Drone at [facility]. She stated that order was received and pt is due to be called by RT today to schedule set up. Steward Drone stated that she would contact the patient and let him know this and that he should be hearing from the RT today or Friday at the latest. Ollen Gross

## 2012-02-20 ENCOUNTER — Ambulatory Visit (INDEPENDENT_AMBULATORY_CARE_PROVIDER_SITE_OTHER): Payer: Medicare Other | Admitting: Pulmonary Disease

## 2012-02-20 ENCOUNTER — Encounter: Payer: Self-pay | Admitting: Pulmonary Disease

## 2012-02-20 VITALS — BP 130/90 | HR 61 | Temp 97.9°F | Ht 74.0 in | Wt 362.0 lb

## 2012-02-20 DIAGNOSIS — G4733 Obstructive sleep apnea (adult) (pediatric): Secondary | ICD-10-CM

## 2012-02-20 MED ORDER — TRAZODONE HCL 50 MG PO TABS: 50.0000 mg | ORAL_TABLET | Freq: Every day | ORAL | Status: DC

## 2012-02-20 NOTE — Patient Instructions (Addendum)
Continue to try wearing cpap everynight. Will get your pressure changed over to the automatic setting for improved comfort. Start trazodone 50mg  one near bedtime each night for next 2 weeks only. Do not stay in bed more than 30-51min if you cannot fall asleep.  Go to family room to watch tv or read until you get sleepy. No sleeping of any kind during the day. Work on weight loss followup with me in 3mos, but call me if you continue having issues with your cpap.

## 2012-02-20 NOTE — Progress Notes (Signed)
  Subjective:    Patient ID: Donaldson Richter, male    DOB: 02/24/1955, 57 y.o.   MRN: [id number]  HPI Patient comes in today for followup of his known severe obstructive sleep apnea.  He has been started on CPAP, but has been having issues with tolerance because of sleep onset.  He denies any issues with the mask fit or pressure, but it has aggravated his issue with sleep onset that he has had for many years.   Review of Systems  Constitutional: Negative for fever and unexpected weight change.  HENT: Positive for congestion, sore throat, rhinorrhea and postnasal drip. Negative for ear pain, nosebleeds, sneezing, trouble swallowing, dental problem and sinus pressure.   Eyes: Negative for redness and itching.  Respiratory: Negative for cough, chest tightness, shortness of breath and wheezing.   Cardiovascular: Negative for palpitations and leg swelling.  Gastrointestinal: Negative for nausea and vomiting.  Genitourinary: Negative for dysuria.  Musculoskeletal: Negative for joint swelling.  Skin: Negative for rash.  Neurological: Negative for headaches.  Hematological: Does not bruise/bleed easily.  Psychiatric/Behavioral: Negative for dysphoric mood. The patient is not nervous/anxious.        Objective:   Physical Exam Morbidly obese male in no acute distress No skin breakdown or pressure necrosis from the CPAP mask Nose without purulence or discharge noted Neck large, difficult to assess for lymphadenopathy or thyromegaly Lower extremities have mild edema, no cyanosis Awake, does not appear to be overly sleepy, moves all 4 extremities.       Assessment & Plan:

## 2012-02-20 NOTE — Assessment & Plan Note (Signed)
The patient is having no issues with his mask fit or pressure, but has been having issues with sleep onset.  I have reviewed good sleep hygiene with him, as well as behavioral therapies that can help with this.  I would also like to try him on a short-term sedative hypnotic for the next few weeks in order to help with his CPAP tolerance.  Finally, we need to optimize the pressure for the patient, and we used the automatic settings on his device to achieve this.  I have also encouraged him to work aggressively on weight loss.

## 2012-05-20 ENCOUNTER — Ambulatory Visit (INDEPENDENT_AMBULATORY_CARE_PROVIDER_SITE_OTHER): Payer: Medicare Other | Admitting: Pulmonary Disease

## 2012-05-20 ENCOUNTER — Encounter: Payer: Self-pay | Admitting: Pulmonary Disease

## 2012-05-20 VITALS — BP 124/80 | HR 63 | Temp 98.0°F | Ht 73.0 in | Wt 363.2 lb

## 2012-05-20 DIAGNOSIS — G4733 Obstructive sleep apnea (adult) (pediatric): Secondary | ICD-10-CM

## 2012-05-20 NOTE — Progress Notes (Signed)
  Subjective:    Patient ID: Melvin Hudson, male    DOB: Jan 05, 1956, 57 y.o.   MRN: [id number]  HPI Patient comes in today for followup of his obstructive sleep apnea.  He initially had issues with compliance because of insomnia issues, but now he feels that he is doing much better.  At the last visit, we had asked his medical equipment company to optimize his pressure, however this was never done.  We'll have to reorder.  He is having no issues with his CPAP mask, and he states that he no longer takes naps during the day.   Review of Systems  Constitutional: Negative for fever and unexpected weight change.  HENT: Negative for ear pain, nosebleeds, congestion, sore throat, rhinorrhea, sneezing, trouble swallowing, dental problem, postnasal drip and sinus pressure.   Eyes: Positive for discharge and redness. Negative for itching.  Respiratory: Negative for cough, chest tightness, shortness of breath and wheezing.   Cardiovascular: Negative for palpitations and leg swelling.  Gastrointestinal: Negative for nausea and vomiting.  Genitourinary: Negative for dysuria.  Musculoskeletal: Positive for arthralgias. Negative for joint swelling.       Both legs  Skin: Negative for rash.  Neurological: Negative for headaches.  Hematological: Does not bruise/bleed easily.  Psychiatric/Behavioral: Negative for dysphoric mood. The patient is not nervous/anxious.        Objective:   Physical Exam Overly obese male in no acute distress Nose with purulent discharge noted No skin breakdown or pressure necrosis from the CPAP mask Neck without lymphadenopathy or thyromegaly Lower extremities with edema noted, no cyanosis Alert, does not appear to be sleepy, moves all 4 extremities.       Assessment & Plan:

## 2012-05-20 NOTE — Progress Notes (Deleted)
  Subjective:    Patient ID: Melvin Hudson, male    DOB: 04-12-1955, 57 y.o.   MRN: [id number]  HPI    Review of Systems  Constitutional: Negative for fever and unexpected weight change.  HENT: Negative for ear pain, nosebleeds, congestion, sore throat, rhinorrhea, sneezing, trouble swallowing, dental problem, postnasal drip and sinus pressure.   Eyes: Negative for redness and itching.  Respiratory: Negative for cough, chest tightness, shortness of breath and wheezing.   Cardiovascular: Negative for palpitations and leg swelling.  Gastrointestinal: Negative for nausea and vomiting.  Genitourinary: Negative for dysuria.  Musculoskeletal: Negative for joint swelling.  Skin: Negative for rash.  Neurological: Negative for headaches.  Hematological: Does not bruise/bleed easily.  Psychiatric/Behavioral: Negative for dysphoric mood. The patient is not nervous/anxious.        Objective:   Physical Exam        Assessment & Plan:

## 2012-05-20 NOTE — Patient Instructions (Addendum)
Will have your DME put you machine on auto setting to optimize your pressure.  I will call you with results. Stay on cpap as much as you can each night to qualify you. Work on weight loss followup with me in 6mos

## 2012-05-20 NOTE — Assessment & Plan Note (Signed)
The patient feels that he is doing better with the CPAP, and has resolved his insomnia issues to a point.  He feels that his compliance is much improved.  He is also seen a decrease in his daytime sleepiness as well.  We still need to optimize his pressure, since his medical equipment Company never followed through on the order from the prior visit.  We will send this again.  Also encouraged patient work aggressively on weight loss.

## 2012-08-28 ENCOUNTER — Other Ambulatory Visit: Payer: Self-pay | Admitting: Pulmonary Disease

## 2012-08-28 DIAGNOSIS — G4733 Obstructive sleep apnea (adult) (pediatric): Secondary | ICD-10-CM

## 2012-11-17 ENCOUNTER — Telehealth: Payer: Self-pay | Admitting: Pulmonary Disease

## 2012-11-17 NOTE — Telephone Encounter (Signed)
I spoke with Morrie Sheldon. She reports pt had a medicare audit. Pt became compliant right after the 1st 90 days of therapy. D/T this Medicare is stating he needs a new sleep study d/t compliance was not meet until after the 1st 90 days of therapy. Medicare refuses to pay for anything else until the sleep study is done. Morrie Sheldon gave pt free supplies so pt could still use the machine. She reports pt is already aware of this. Morrie Sheldon stated it is not them requiring this but Medicare advised them this is what pt needs for them to keep paying for supplies. Please advise KC thanks

## 2012-11-17 NOTE — Telephone Encounter (Signed)
Let them know pt could not be compliant because of severe insomnia.  I have worked with him on this, and now he is compliant as you said.  They can get my notes to see this for documentation, and send these to medicare.  See if this will work.

## 2012-11-18 NOTE — Telephone Encounter (Signed)
Medicare has audited this pt already and have reviewed the notes you have done they said he was noncompliant in the 1st 90days and he has to have another sleep study Tobe Sos

## 2012-11-19 ENCOUNTER — Encounter: Payer: Self-pay | Admitting: Pulmonary Disease

## 2012-11-19 ENCOUNTER — Ambulatory Visit (INDEPENDENT_AMBULATORY_CARE_PROVIDER_SITE_OTHER): Payer: Medicare Other | Admitting: Pulmonary Disease

## 2012-11-19 VITALS — BP 120/90 | HR 62 | Temp 97.9°F | Ht 73.0 in | Wt 336.2 lb

## 2012-11-19 DIAGNOSIS — G4733 Obstructive sleep apnea (adult) (pediatric): Secondary | ICD-10-CM

## 2012-11-19 NOTE — Telephone Encounter (Signed)
Order placed incomputer for [facility]ps[facility] will call pt and set this up necessary

## 2012-11-19 NOTE — Progress Notes (Signed)
  Subjective:    Patient ID: Agam Davenport, male    DOB: 1955/10/27, 57 y.o.   MRN: [id number]  HPI Patient comes in today for followup of his known history of sleep apnea.  He is wearing CPAP compliantly, and he is on an optimal pressure with excellent tolerance.  The patient feels that he is sleeping well with the device, and has increased daytime alertness.  He has lost 27 pounds since the last visit.   Review of Systems  Constitutional: Negative for fever and unexpected weight change.  HENT: Positive for sore throat. Negative for ear pain, nosebleeds, congestion, rhinorrhea, sneezing, trouble swallowing, dental problem, postnasal drip and sinus pressure.   Eyes: Negative for redness and itching.  Respiratory: Negative for cough, chest tightness, shortness of breath and wheezing.   Cardiovascular: Negative for palpitations and leg swelling.  Gastrointestinal: Negative for nausea and vomiting.  Genitourinary: Negative for dysuria.  Musculoskeletal: Negative for joint swelling.  Skin: Negative for rash.  Neurological: Negative for headaches.  Hematological: Does not bruise/bleed easily.  Psychiatric/Behavioral: Negative for dysphoric mood. The patient is not nervous/anxious.        Objective:   Physical Exam Obese male in no acute distress Nose without purulence or discharge noted No skin breakdown or pressure necrosis from the CPAP mask Neck without lymphadenopathy or thyromegaly Lower extremities with minimal edema, no cyanosis Alert and oriented, moves all 4 extremities.  Does not appear to be sleepy.       Assessment & Plan:

## 2012-11-19 NOTE — Assessment & Plan Note (Signed)
The patient is currently doing very well with CPAP, and has seen great improvement in his symptoms.  He is trying to eat better, and has lost 27 pounds.  Unfortunately, Medicare is requiring him to have another sleep study in order to get supplies from his medical equipment company.  He had issues with insomnia when he first started CPAP, but we have worked through this and how is wearing compliantly.  I have told the patient that I do not agree with the decision by Medicare, and I think it is a total waste of money, but I have no way of getting him supplies if he does not have a repeat study as required by Medicare.  The patient understands.

## 2012-11-19 NOTE — Telephone Encounter (Signed)
Let pt know and see if he is ok with this.

## 2012-11-19 NOTE — Patient Instructions (Addendum)
Continue with cpap, and keep up with mask changes and supplies. Keep working on weight loss.  You are doing great. Will have the schedulers call about getting your sleep study per medicare guidelines. followup with me one year if doing well.

## 2012-11-19 NOTE — Telephone Encounter (Signed)
lmomtcb x1 for pt 

## 2012-12-22 ENCOUNTER — Ambulatory Visit (HOSPITAL_BASED_OUTPATIENT_CLINIC_OR_DEPARTMENT_OTHER): Payer: Medicare Other | Attending: Pulmonary Disease | Admitting: Radiology

## 2012-12-22 VITALS — Ht 73.0 in | Wt 344.0 lb

## 2012-12-22 DIAGNOSIS — G4733 Obstructive sleep apnea (adult) (pediatric): Secondary | ICD-10-CM | POA: Insufficient documentation

## 2012-12-27 ENCOUNTER — Telehealth: Payer: Self-pay | Admitting: Pulmonary Disease

## 2012-12-27 NOTE — Telephone Encounter (Signed)
Returning call.Melvin Hudson ° °

## 2012-12-27 NOTE — Telephone Encounter (Signed)
ATC pt NA line busy x 4 wcb

## 2012-12-27 NOTE — Telephone Encounter (Signed)
lmtcb x1 

## 2012-12-27 NOTE — Telephone Encounter (Signed)
I spoke with the pt and he states over the last several nights his reservoir has been running dry with his cpap and he has been having dry mouth. Pt states this has never happened before. He denies any leak and states his mask fits well. Please advise. Carron Curie, CMA Allergies  Allergen Reactions  . Advil [Ibuprofen]     Feels "jittery"  . Azilsartan Swelling    Avoid ARB and ACEI per MD   DME-[facility]

## 2012-12-27 NOTE — Telephone Encounter (Signed)
See if he has climate control tubing (tubing with a wire running thru it).  If not, may help and we can order. If he already has, would turn down heat on humidifier and see if uses a little less water.  If it still is an issue, can have dme check heater on humidifier.

## 2012-12-28 NOTE — Telephone Encounter (Signed)
LMTCB x 1 

## 2012-12-29 NOTE — Telephone Encounter (Signed)
Spoke with the pt and he has the climate control tubing so he will turn down the humidifier and see if this helps. Carron Curie, CMA

## 2012-12-30 ENCOUNTER — Telehealth: Payer: Self-pay | Admitting: Pulmonary Disease

## 2012-12-30 DIAGNOSIS — G4733 Obstructive sleep apnea (adult) (pediatric): Secondary | ICD-10-CM

## 2012-12-30 NOTE — Telephone Encounter (Signed)
i need an order for [facility] to continue cpap supplies thanks  Tobe Sos

## 2012-12-30 NOTE — Telephone Encounter (Signed)
Order was sent to [facility] 

## 2012-12-30 NOTE — Telephone Encounter (Signed)
Please let pt know that his sleep study re-verified he has sleep apnea, and this should suffice to make medicare happy.  Please call his dme and let them know his sleep study was re-done, and shows sleep apnea.  They need to continue getting him supplies and mask changes.

## 2012-12-31 NOTE — Procedures (Signed)
NAMEMYKLE, PASCUA              ACCOUNT NO.:  [id number]  MEDICAL RECORD NO.:  [id number]          PATIENT TYPE:  OUT  LOCATION:  SLEEP CENTER                 FACILITY:  [facility]  PHYSICIAN:  Barbaraann Share, MD,FCCPDATE OF BIRTH:  Jan 14, 1956  DATE OF STUDY:  12/22/2012                           NOCTURNAL POLYSOMNOGRAM  REFERRING PHYSICIAN:  Barbaraann Share, MD,FCCP  LOCATION:  Sleep Lab.  INDICATION FOR STUDY:  Hypersomnia with sleep apnea.  EPWORTH SLEEPINESS SCORE:  3.  MEDICATIONS:  SLEEP ARCHITECTURE:  The patient had a total sleep time of 342 minutes with no slow-wave sleep and only 34 minutes of REM.  Sleep onset latency was normal at 19 minutes and REM onset was normal at 55 minutes.  Sleep efficiency was moderately decreased at 76%.  RESPIRATORY DATA:  The patient was found to have no obstructive apneas, 27 obstructive hypopneas, and 40 respiratory effort-related arousals. This gave him a respiratory disturbance index of 12 events per hour. The events were more common in the supine position, and there was moderate snoring noted throughout.  OXYGEN DATA:  There was O2 desaturation as low as 86% with the patient's obstructive events.  CARDIAC DATA:  Rare PAC noted, but no clinically significant arrhythmias were seen.  MOVEMENT-PARASOMNIA:  The patient had no significant leg jerks or other abnormal behaviors noted.  IMPRESSIONS-RECOMMENDATIONS: 1. Mild obstructive sleep apnea, with an RDI of 12 events per hour and     oxygen desaturation as low as 86%.  Treatment for this degree of     sleep apnea can include a trial of weight loss alone, upper airway     surgery, dental     appliance, and also continuous positive airway pressure. 2. Rare premature atrial contraction noted, but no clinically     significant arrhythmias were seen.     Barbaraann Share, MD,FCCP Diplomate, American Board of Sleep Medicine    [facility]/MED[facility]  D:  12/30/2012 08:47:14  T:  12/31/2012  00:33:52  Job:  [id number]

## 2013-05-17 ENCOUNTER — Telehealth: Payer: Self-pay | Admitting: Pulmonary Disease

## 2013-05-17 NOTE — Telephone Encounter (Signed)
Called and spoke with pt. He reports he feels the air from CPAP is irritating his throat and causing right ear pain. This has been going on x 1 month. He went to PCP and was told he didn't have strept throat. He has tried adjusting the humidity and still no change. Pt is wanting to know what else can be done. Please advise KC thanks  Allergies  Allergen Reactions  . Advil [Ibuprofen]     Feels "jittery"  . Azilsartan Swelling    Avoid ARB and ACEI per MD

## 2013-05-17 NOTE — Telephone Encounter (Signed)
I called and spoke with pt. He reports he already adjusts the humidifer on CPAP and it is not at the max yet. He is going to try the chlortrimeton and see if this helps. Nothing further needed

## 2013-05-17 NOTE — Telephone Encounter (Signed)
If he feels dry after wearing cpap, then he needs to turn up the heat on his humidifier.  If he has maxed this out, needs to have humidifier checked by DME to make sure working properly. If this is not the issue, then postnasal drip from allergies is the other likely culprit.  Would take chlorpheniramine 4mg , take 2 at bedtime to see if improves.  If he is getting enough moisture, then his sore throat is not from cpap.

## 2013-06-14 DIAGNOSIS — C099 Malignant neoplasm of tonsil, unspecified: Secondary | ICD-10-CM

## 2013-06-14 HISTORY — DX: Malignant neoplasm of tonsil, unspecified: C09.9

## 2013-06-20 ENCOUNTER — Other Ambulatory Visit: Payer: Self-pay | Admitting: Otolaryngology

## 2013-06-20 DIAGNOSIS — C099 Malignant neoplasm of tonsil, unspecified: Secondary | ICD-10-CM

## 2013-06-21 ENCOUNTER — Telehealth: Payer: Self-pay | Admitting: Hematology and Oncology

## 2013-06-21 NOTE — Telephone Encounter (Signed)
S/W PATIIENT AND GAVE NP APPT FOR 05/14 @ 1:45 W/DR. Poinsett.  REFERRING DR. Constance Holster DX- TONSIL CA WELCOME PACKET MAILED.

## 2013-06-23 ENCOUNTER — Ambulatory Visit
Admission: RE | Admit: 2013-06-23 | Discharge: 2013-06-23 | Disposition: A | Discharge status: Home / Self Care | Payer: Medicare (Managed Care) | Source: Ambulatory Visit | Attending: Otolaryngology | Admitting: Otolaryngology

## 2013-06-23 DIAGNOSIS — C099 Malignant neoplasm of tonsil, unspecified: Secondary | ICD-10-CM

## 2013-06-23 MED ORDER — IOHEXOL 300 MG/ML  SOLN
100.0000 mL | Freq: Once | INTRAMUSCULAR | Status: AC | PRN
  Administered 2013-06-23: 100 mL via INTRAVENOUS

## 2013-06-28 ENCOUNTER — Encounter: Payer: Self-pay | Admitting: Radiation Oncology

## 2013-06-28 NOTE — Progress Notes (Signed)
Radiation Oncology         [phone]) [phone] ________________________________  Initial outpatient Consultation  Name: Melvin Hudson MRN: [id number]  Date: 06/29/2013  DOB: 09-04-1955  OE:[id number],HWEXHB, MD  [name], MD   REFERRING PHYSICIAN: [name], MD  DIAGNOSIS: T3N2bMx Stage IVA squamous cell carcinoma, Right tonsil  HISTORY OF PRESENT ILLNESS::Melvin Hudson is a 58 y.o. male who presented with right sided odynophagia and otalgia that were refractory to antibiotics.  He has a prior history of smoking cigarettes and distant ETOH use.  He smoked cigars until recently, when he quit upon diagnosis. Due to symptoms, he was referred to Dr Constance Holster.  Biopsy of right tonsil on 4-28 revealed squamous cell carcinoma.  p16 status unavailable to me.  CT on 06-23-13 of his neck showed a 4.3 cm right tonsil mass concerning for cancer.  Also, right level 2 nodes were suspicious for cancer.  One was 3.2cm in dimension.  No PET to date.  He is here alone today; he lives alone, and seems to have little social support. He has two teeth, eating soft foods, no respiratory complaints.   PREVIOUS RADIATION THERAPY: No  PAST MEDICAL HISTORY:  has a past medical history of Hypertension; Anginal pain; Angioedema (09/17/11); Arthritis; History of gout; and Obesity.    PAST SURGICAL HISTORY: Past Surgical History  Procedure Laterality Date  . Knee arthroscopy  ~ 1994    right  . Multiple tooth extractions  ~ 2008    FAMILY HISTORY: family history includes Cancer in his sister.  SOCIAL HISTORY:  reports that he quit smoking about 9 years ago. His smoking use included Cigars. He does not have any smokeless tobacco history on file. He reports that he drinks alcohol. He reports that he does not use illicit drugs.  ALLERGIES: Advil and Azilsartan  MEDICATIONS:  Current Outpatient Prescriptions  Medication Sig Dispense Refill  . amLODipine (NORVASC) 10 MG tablet Take 10 mg by mouth daily.       [name]  aspirin EC 81 MG tablet Take 81 mg by mouth daily.        . Cholecalciferol (VITAMIN D-3 PO) Take 1,000 Units by mouth daily.      . diphenhydrAMINE (BENADRYL) 25 MG tablet Take 1 tablet (25 mg total) by mouth every 6 (six) hours as needed for itching.  30 tablet  0  . metoprolol (LOPRESSOR) 50 MG tablet Take 50 mg by mouth daily. 1 tablet po daily      . oxyCODONE-acetaminophen (PERCOCET) 10-325 MG per tablet Take 1 tablet by mouth every 6 (six) hours as needed.       . polyethylene glycol (MIRALAX / GLYCOLAX) packet Take 17 g by mouth daily.      [name] spironolactone (ALDACTONE) 25 MG tablet Take 25 mg by mouth daily.      . tadalafil (CIALIS) 20 MG tablet Take 20 mg by mouth daily as needed.       No current facility-administered medications for this encounter.    REVIEW OF SYSTEMS:  Notable for that above.   PHYSICAL EXAM:  height is 6\' 1"  (1.854 m) and weight is 342 lb 8 oz (155.357 kg). His temperature is 97.7 F (36.5 C). His blood pressure is 124/79 and his pulse is 67. His oxygen saturation is 99%.   General: Alert and oriented, in no acute distress; walks with cane HEENT: Head is normocephalic.  Extraocular movements are intact. Oropharynx - large right tonsil mass, approx 4cm, tethered to base of tongue,  involving soft palate, not crossing midline. 2 teeth remaining in poor repair, low anterior jaw Neck:  fullness in right level 2, no supraclavicular lymphadenopathy. Heart: Regular in rate and rhythm with no murmurs, rubs, or gallops. Chest: Clear to auscultation bilaterally, with no rhonchi, wheezes, or rales. Abdomen: Soft, nontender, nondistended, with no rigidity or guarding. Extremities: No cyanosis or edema. Lymphatics: see above. Skin: No concerning lesions. Musculoskeletal:uses cane. Neurologic: Cranial nerves II through XII are grossly intact. No obvious focalities. Speech is fluent.   Psychiatric: Judgment and insight are intact. Affect is appropriate.   ECOG = 2  0 -  Asymptomatic (Fully active, able to carry on all predisease activities without restriction)  1 - Symptomatic but completely ambulatory (Restricted in physically strenuous activity but ambulatory and able to carry out work of a light or sedentary nature. For example, light housework, office work)  2 - Symptomatic, <50% in bed during the day (Ambulatory and capable of all self care but unable to carry out any work activities. Up and about more than 50% of waking hours)  3 - Symptomatic, >50% in bed, but not bedbound (Capable of only limited self-care, confined to bed or chair 50% or more of waking hours)  4 - Bedbound (Completely disabled. Cannot carry on any self-care. Totally confined to bed or chair)  5 - Death   Eustace Pen MM, Creech RH, Tormey DC, et al. [phone]). "Toxicity and response criteria of the [facility]". [place] Oncol. 5 (6): 649-55   LABORATORY DATA:  Lab Results  Component Value Date   WBC 10.3 09/18/2011   HGB 15.1 09/18/2011   HCT 42.7 09/18/2011   MCV 83.2 09/18/2011   PLT 284 09/18/2011   CMP     Component Value Date/Time   NA 139 09/18/2011 0436   K 3.6 09/18/2011 0436   CL 102 09/18/2011 0436   CO2 26 09/18/2011 0436   GLUCOSE 213* 09/18/2011 0436   BUN 13 09/18/2011 0436   CREATININE 0.89 09/18/2011 0436   CALCIUM 9.1 09/18/2011 0436   GFRNONAA >90 09/18/2011 0436   GFRAA >90 09/18/2011 0436         RADIOGRAPHY: Ct Soft Tissue Neck W Contrast  06/23/2013   CLINICAL DATA:  Tonsil cancer.  Right neck lump.  EXAM: CT NECK WITH CONTRAST  TECHNIQUE: Multidetector CT imaging of the neck was performed using the standard protocol following the bolus administration of intravenous contrast.  CONTRAST:  146mL OMNIPAQUE IOHEXOL 300 MG/ML  SOLN  COMPARISON:  None.  FINDINGS: A solid mass lesion centered in the right palatine tonsil extends to the right tongue base measuring 4.3 x 2.9 x 4.1 cm. A right level 2 lymph node measures 3.2 x 1.0 cm. A more medial right level 2  lymph node is also worrisome for neoplasm, measuring 2.0 x 0.8 cm. No additional pathologic nodes are present on the right. No significant left-sided adenopathy is present.  No other focal mucosal or submucosal lesion is present. The vocal cords are midline and symmetric. The thyroid is unremarkable.  Bone windows demonstrate previous fusion at C5-6. Adjacent level osteophytes at C4-5 contribute to foraminal narrowing bilaterally, right greater than left. Uncovertebral disease is evident from C3-4 through C6-7. Vertebral body heights and alignment are maintained.  Wires are present along the inferior aspect of the right mandibular ramus. T mandibular teeth remain. The patient is otherwise edentulous.  IMPRESSION: 1. 4.3 x 2.9 x 4.1 cm right tonsil and peritonsillar mass is concerning  for primary neoplasm. 2. Enlarged right level 2 lymph nodes are likely metastases. 3. Moderate spondylosis of the cervical spine with fusion at C5-6.   Electronically Signed   By: Lawrence Santiago M.D.   On: 06/23/2013 13:58      IMPRESSION/PLAN: This is a delightful 58 year old man with T3N2b (PET PENDING) squamous cell carcinoma of the right tonsil, prior ETOH / smoking history. Assuming PET is negative for distant metastases, he is an excellent candidate for  radiotherapy. Plan is as below:   1) Tomorrow he sees med/onc to discuss chemotherapy  1a) PET to be ordered today  2) Referral to dentistry for dental evaluation/extractions if needed - 2 remaining teeth in bad repair.  3) Will refer to social work for social support - he has little of this, lives alone  4) Will refer to nutrition for nutrition support   5) Medical Oncology may eventually refer to surgery for PEG tube placement. This is depending on chemotherapy plans and patient decision with Dr. Alvy Bimler.   6) Will refer to swallowing therapy for dysphagia prevention   7) Simulation once cleared by dentistry. Anticipate 7 weeks of RT - 70 Gy in 35 fractions.     8) PT referral for pre-RT assessment / neck measurements due to risk of lymphedema in neck; may benefit from PT for this after completion of radiotherapy. May also benefit from other PT services - walks with cane, deconditioned.  9) Home health referral (patient lives alone, could use a home health aide)   10) At request of patient I summarized all of my impressions by phone to his niece, Evert Kohl at [phone]  It was a pleasure meeting the patient today. We discussed the risks, benefits, and side effects of adjuvant radiotherapy. We talked in detail about acute and late effects. He understands that some of the most bothersome acute effects will be significant soreness of the mouth and throat, changes in taste, changes in salivary function, skin irritation, hair loss, dehydration, weight loss and fatigue. We talked about late effects which include but are not necessarily limited to dysphagia, hypothyroidism, nerve injury, spinal cord injury, dry mouth, trismus, and neck edema. No guarantees of treatment were given. A consent form was signed and placed in the patient's medical record. The patient is enthusiastic about proceeding with treatment. I look forward to participating in the patient's care.  __________________________________________   Eppie Gibson, MD

## 2013-06-28 NOTE — Progress Notes (Addendum)
Head and Neck Cancer Location of Tumor / Histology: T2N2bMx Stage IVA squamous cell carcinoma, Right Tonsil   Patient presented to Dr. Constance Holster per Referral by [facility] for a 2 month history of  right sided odynophagia and otalgia that were refractory to antibiotics.      Biopsies of Tonsil, Right(if applicable) revealed:  0/88/11 Squamous Cell Carcinoma  Nutrition Status:  Weight changes: None per Dr. Garret Reddish Rosen's note  Swallowing status: Right sided "odyynophagia"  Plans, if any, for PEG tube:  Tobacco/Marijuana/Snuff/ETOH use: Quit smoking 9 years ago( cigars), 09/17/11 "sober since 1985", No drugs  Past/Anticipated interventions by otolaryngology, if any: Biopsy of the right Tonsil  Past/Anticipated interventions by medical oncology, if any: Dr.  Heath Lark 06/30/13  Referrals yet, to any of the following?  Social Work?  Dentistry? Edentulous except for 2 teeth in the anterior lower jaw  Swallowing therapy?Unable to grade pain.  Affects eating- "irritation of throat" , referred otalgia  Nutrition? Eating soft foods  Med/Onc? Dr. Heath Lark  PEG placement?   Financial Counselor- 06/30/13  SAFETY ISSUES:  Prior radiation?NO  Pacemaker/ICD? NO  Possible current pregnancy? NO  Is the patient on methotrexate? NO  Current Complaints / other details:  Sleep apnea.  Has CPAP

## 2013-06-29 ENCOUNTER — Encounter: Payer: Self-pay | Admitting: Radiation Oncology

## 2013-06-29 ENCOUNTER — Ambulatory Visit
Admission: RE | Admit: 2013-06-29 | Discharge: 2013-06-29 | Disposition: A | Discharge status: Home / Self Care | Payer: Medicare Other | Source: Ambulatory Visit | Attending: Radiation Oncology | Admitting: Radiation Oncology

## 2013-06-29 VITALS — BP 124/79 | HR 67 | Temp 97.7°F | Ht 73.0 in | Wt 342.5 lb

## 2013-06-29 DIAGNOSIS — C090 Malignant neoplasm of tonsillar fossa: Secondary | ICD-10-CM | POA: Insufficient documentation

## 2013-06-29 DIAGNOSIS — Z510 Encounter for antineoplastic radiation therapy: Secondary | ICD-10-CM | POA: Insufficient documentation

## 2013-06-29 DIAGNOSIS — C099 Malignant neoplasm of tonsil, unspecified: Secondary | ICD-10-CM | POA: Insufficient documentation

## 2013-06-30 ENCOUNTER — Encounter: Payer: Self-pay | Admitting: *Deleted

## 2013-06-30 ENCOUNTER — Ambulatory Visit (HOSPITAL_BASED_OUTPATIENT_CLINIC_OR_DEPARTMENT_OTHER): Payer: Medicare Other | Admitting: Hematology and Oncology

## 2013-06-30 ENCOUNTER — Telehealth: Payer: Self-pay | Admitting: *Deleted

## 2013-06-30 ENCOUNTER — Ambulatory Visit: Payer: Medicare Other

## 2013-06-30 ENCOUNTER — Encounter: Payer: Self-pay | Admitting: Hematology and Oncology

## 2013-06-30 ENCOUNTER — Ambulatory Visit (HOSPITAL_BASED_OUTPATIENT_CLINIC_OR_DEPARTMENT_OTHER): Payer: Medicare Other

## 2013-06-30 ENCOUNTER — Telehealth: Payer: Self-pay | Admitting: Hematology and Oncology

## 2013-06-30 VITALS — BP 151/89 | HR 74 | Temp 98.0°F | Resp 19 | Ht 73.0 in | Wt 340.3 lb

## 2013-06-30 DIAGNOSIS — C099 Malignant neoplasm of tonsil, unspecified: Secondary | ICD-10-CM

## 2013-06-30 LAB — COMPREHENSIVE METABOLIC PANEL (CC13)
ALBUMIN: 3.9 g/dL (ref 3.5–5.0)
ALT: 11 U/L (ref 0–55)
AST: 13 U/L (ref 5–34)
Alkaline Phosphatase: 147 U/L (ref 40–150)
Anion Gap: 11 mEq/L (ref 3–11)
BUN: 10.1 mg/dL (ref 7.0–26.0)
CALCIUM: 9.7 mg/dL (ref 8.4–10.4)
CHLORIDE: 106 meq/L (ref 98–109)
CO2: 24 mEq/L (ref 22–29)
CREATININE: 0.8 mg/dL (ref 0.7–1.3)
Glucose: 119 mg/dl (ref 70–140)
POTASSIUM: 4 meq/L (ref 3.5–5.1)
Sodium: 141 mEq/L (ref 136–145)
Total Bilirubin: 0.59 mg/dL (ref 0.20–1.20)
Total Protein: 7.3 g/dL (ref 6.4–8.3)

## 2013-06-30 LAB — CBC WITH DIFFERENTIAL/PLATELET
BASO%: 0.3 % (ref 0.0–2.0)
Basophils Absolute: 0 10*3/uL (ref 0.0–0.1)
EOS%: 3.6 % (ref 0.0–7.0)
Eosinophils Absolute: 0.4 10*3/uL (ref 0.0–0.5)
HCT: 35.4 % — ABNORMAL LOW (ref 38.4–49.9)
HGB: 12.3 g/dL — ABNORMAL LOW (ref 13.0–17.1)
LYMPH#: 2.3 10*3/uL (ref 0.9–3.3)
LYMPH%: 22.5 % (ref 14.0–49.0)
MCH: 28.9 pg (ref 27.2–33.4)
MCHC: 34.8 g/dL (ref 32.0–36.0)
MCV: 83.1 fL (ref 79.3–98.0)
MONO#: 0.8 10*3/uL (ref 0.1–0.9)
MONO%: 7.4 % (ref 0.0–14.0)
NEUT#: 6.8 10*3/uL — ABNORMAL HIGH (ref 1.5–6.5)
NEUT%: 66.2 % (ref 39.0–75.0)
Platelets: 334 10*3/uL (ref 140–400)
RBC: 4.26 10*6/uL (ref 4.20–5.82)
RDW: 16.8 % — ABNORMAL HIGH (ref 11.0–14.6)
WBC: 10.2 10*3/uL (ref 4.0–10.3)

## 2013-06-30 LAB — LACTATE DEHYDROGENASE (CC13): LDH: 199 U/L (ref 125–245)

## 2013-06-30 LAB — URIC ACID (CC13): Uric Acid, Serum: 7.7 mg/dl — ABNORMAL HIGH (ref 2.6–7.4)

## 2013-06-30 NOTE — Telephone Encounter (Signed)
Gave pt appt for lab,md and for june 2015

## 2013-06-30 NOTE — Telephone Encounter (Signed)
Called patient to inform of test, dental appt. And nutrition appt. Spoke with patient and he is aware of these appts.

## 2013-06-30 NOTE — Progress Notes (Signed)
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 male is here because of newly diagnosed squamous cell carcinoma of the right tonsil. According to the patient, the first initial presentation was due to sensation of sore throat and fullness in the right neck approximately 3 months ago.[name] He was prescribed antibiotics and he was not improving. He started to present with hemoptysis 1-2 months ago and is currently daily. He complained of painful swallowing but no dysphagia. He has lost an unspecified amount of weight. He developed recent constipation. he denies any hearing deficit, difficulties with chewing food or changes in the quality of his voice. On 06/14/13 , he underwent ENT evaluation and was found to have a large tonsillar mass and abnormal lymphadenopathy. CT scan confirmed locally advanced disease.  Pathology from biopsy of the right tonsil confirmed diagnosis of squamous cell carcinoma, HPV status unknown. MEDICAL HISTORY:  Past Medical History  Diagnosis Date  . Hypertension   . Anginal pain   . Angioedema 09/17/11    tongue and lips  . Arthritis     "both of my legs and feet"  . History of gout   . Obesity     SURGICAL HISTORY: Past Surgical History  Procedure Laterality Date  . Knee arthroscopy  ~ 1994    right  . Multiple tooth extractions  ~ 2008    SOCIAL HISTORY: History   Social History  . Marital Status: Married    Spouse Name: N/A    Number of Children: N/A  . Years of Education: N/A   Occupational History  . Not on file.   Social History Main Topics  . Smoking status: Former Smoker -- 40 years    Types: Cigars   Quit date: 06/28/2004  . Smokeless tobacco: Never Used     Comment: Used to smoke cigs QUIT x 9 years ago.   . 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 Drug Use: No  . Sexual Activity: Not Currently   Other Topics Concern  . Not on file   Social History Narrative  . No narrative on file    FAMILY HISTORY: Family History  Problem Relation Age of Onset  . Cancer Sister     breast ca    ALLERGIES:  is allergic to advil and azilsartan.  MEDICATIONS:  Current Outpatient Prescriptions  Medication Sig Dispense Refill  . amLODipine (NORVASC) 10 MG tablet Take 10 mg by mouth daily.       [name] aspirin EC 81 MG tablet Take 81 mg by mouth daily.        . Cholecalciferol (VITAMIN D-3 PO) Take 1,000 Units by mouth daily.      . diphenhydrAMINE (BENADRYL) 25 MG tablet Take 1 tablet (25 mg total) by mouth every 6 (six) hours as needed for itching.  30 tablet  0  . metoprolol (LOPRESSOR) 50 MG tablet Take 50 mg by mouth daily. 1 tablet po daily      . oxyCODONE-acetaminophen (PERCOCET) 10-325 MG per tablet Take 1 tablet by mouth every 6 (six) hours as needed.       . polyethylene glycol (MIRALAX / GLYCOLAX) packet Take 17 g by mouth daily.      [name]  spironolactone (ALDACTONE) 25 MG tablet Take 25 mg by mouth daily.      . tadalafil (CIALIS) 20 MG tablet Take 20 mg by mouth daily as needed.       No current facility-administered medications for this visit.    REVIEW OF SYSTEMS:   Constitutional: Denies fevers, chills or abnormal night sweats Eyes: Denies blurriness of vision, double vision or watery eyes Respiratory: Denies cough, dyspnea or wheezes Cardiovascular: Denies palpitation, chest discomfort or lower extremity swelling Gastrointestinal:  Denies nausea, heartburn or change in bowel habits Skin: Denies abnormal skin rashes Neurological:Denies numbness, tingling or new weaknesses Behavioral/Psych: Mood is stable, no new changes  All other systems were reviewed with  the patient and are negative.  PHYSICAL EXAMINATION: ECOG PERFORMANCE STATUS: 1 - Symptomatic but completely ambulatory  Filed Vitals:   06/30/13 1341  BP: 151/89  Pulse: 74  Temp: 98 F (36.7 C)  Resp: 19   Filed Weights   06/30/13 1341  Weight: 340 lb 4.8 oz (154.359 kg)    GENERAL:alert, no distress and comfortable. He is morbidly obese SKIN: skin color, texture, turgor are normal, no rashes or significant lesions EYES: normal, conjunctiva are pink and non-injected, sclera clear OROPHARYNX: Noted oropharyngeal mass in the right tonsil affecting the right palate. Poor dentition is noted NECK: supple, thyroid normal size, non-tender, without nodularity LYMPH:  Palpable lymphadenopathy on the right side of the neck.  LUNGS: clear to auscultation and percussion with normal breathing effort HEART: regular rate & rhythm and no murmurs and no lower extremity edema ABDOMEN:abdomen soft, non-tender and normal bowel sounds Musculoskeletal:no cyanosis of digits and no clubbing  PSYCH: alert & oriented x 3 with fluent speech NEURO: no focal motor/sensory deficits  LABORATORY DATA:  I have reviewed the data as listed Lab Results  Component Value Date   WBC 10.2 06/30/2013   HGB 12.3* 06/30/2013   HCT 35.4* 06/30/2013   MCV 83.1 06/30/2013   PLT 334 06/30/2013   Lab Results  Component Value Date   NA 141 06/30/2013   K 4.0 06/30/2013   CL 102 09/18/2011   CO2 24 06/30/2013    RADIOGRAPHIC STUDIES: I have personally reviewed the radiological images as listed and agreed with the findings in the report. Ct Soft Tissue Neck W Contrast  06/23/2013   CLINICAL DATA:  Tonsil cancer.  Right neck lump.  EXAM: CT NECK WITH CONTRAST  TECHNIQUE: Multidetector CT imaging of the neck was performed using the standard protocol following the bolus administration of intravenous contrast.  CONTRAST:  113mL OMNIPAQUE IOHEXOL 300 MG/ML  SOLN  COMPARISON:  None.  FINDINGS: A solid mass lesion centered in the  right palatine tonsil extends to the right tongue base measuring 4.3 x 2.9 x 4.1 cm. A right level 2 lymph node measures 3.2 x 1.0 cm. A more medial right level 2 lymph node is also worrisome for neoplasm, measuring 2.0 x 0.8 cm. No additional pathologic nodes are present on the right. No significant left-sided adenopathy is present.  No other focal mucosal or submucosal lesion is present. The vocal cords are midline and symmetric. The thyroid is unremarkable.  Bone windows demonstrate previous fusion at C5-6. Adjacent level osteophytes at C4-5 contribute to foraminal narrowing bilaterally, right greater than left. Uncovertebral disease is evident from C3-4 through C6-7. Vertebral body heights and alignment are maintained.  Wires are present along the inferior aspect of the right mandibular ramus. T mandibular teeth remain. The patient is otherwise edentulous.  IMPRESSION:  1. 4.3 x 2.9 x 4.1 cm right tonsil and peritonsillar mass is concerning for primary neoplasm. 2. Enlarged right level 2 lymph nodes are likely metastases. 3. Moderate spondylosis of the cervical spine with fusion at C5-6.   Electronically Signed   By: Lawrence Santiago M.D.   On: 06/23/2013 13:58    ASSESSMENT:  Newly diagnosed squamous cell carcinoma of the Head & Neck, HPV N/A  PLAN:  #1 right tonsil cancer with regional lymph nodes involvement Stage of the disease is to be determined, a PET/CT scan has been ordered.   Prognosis would depend on the results for the PET/CT scan, to be discussed and reviewed in the next visit.   Treatment options would include chemotherapy only, radiation only or chemotherapy in combination with radiation therapy.   I am highly doubtful that the patient is a candidate for concurrent chemoradiation therapy. He has very poor social circumstances with difficulties with transportation. With his significant abdominal obesity, placement of open gastrostomy tube would be very difficult. I am waiting for his  PET/CT scan result. I will discuss further with the radiation oncologist for potential treatment with radiation only without chemotherapy. I agree with referral to nutritionist and dentist I will order blood work today to assess his baseline kidney function and blood count.    Orders Placed This Encounter  Procedures  . CBC with Differential    Standing Status: Future     Number of Occurrences: 1     Standing Expiration Date: 06/30/2014  . Comprehensive metabolic panel    Standing Status: Future     Number of Occurrences: 1     Standing Expiration Date: 06/30/2014  . Uric Acid    Standing Status: Future     Number of Occurrences: 1     Standing Expiration Date: 06/30/2014  . Lactate dehydrogenase    Standing Status: Future     Number of Occurrences: 1     Standing Expiration Date: 06/30/2014    All questions were answered. The patient knows to call the clinic with any problems, questions or concerns. I spent 55 minutes counseling the patient face to face. The total time spent in the appointment was 60 minutes and more than 50% was on counseling.     Heath Lark, MD 06/30/2013 3:44 PM

## 2013-06-30 NOTE — Progress Notes (Signed)
Met with patient during initial consult with Dr. Alvy Bimler.  Introduced myself as his Navigator, explained my role, provided my contact information, encouraged him to call with any questions as his procedures are scheduled.  He verbalized understanding.  Reviewed check-in/registration procedure for [place] appts.  He verbalized understanding.  Showed him location of Dr. Ritta Slot office and Radiology as reference for upcoming appts.  Initiating navigation as L1 patient (new patient) with this encounter.  Gayleen Orem, RN, BSN, [facility] On[facility] Navigator [phone]

## 2013-07-01 ENCOUNTER — Encounter: Payer: Self-pay | Admitting: *Deleted

## 2013-07-01 NOTE — Progress Notes (Signed)
[facility] Psychosocial Distress Screening Clinical Social Work  Clinical Social Work was referred by distress screening protocol.  The patient scored a 5 on the Psychosocial Distress Thermometer which indicates moderate distress. Clinical Social Worker aware these concerns were physical in nature and MD was made aware. No need for CSW to further assess for distress and other psychosocial needs at this time.  ONCBCN DISTRESS SCREENING 06/29/2013  Screening Type Initial Screening  Mark the number that describes how much distress you have been experiencing in the past week 5  Physical Problem type Pain  Physician notified of physical symptoms Yes  Referral to clinical social work Yes   Clinical Social Worker follow up needed: no  If yes, follow up plan:   Loren Racer, [place] Social Worker [facility] Wednesday, Thursday and Friday Phone: [phone] Fax: [phone]

## 2013-07-04 ENCOUNTER — Encounter (HOSPITAL_COMMUNITY)
Admission: RE | Admit: 2013-07-04 | Discharge: 2013-07-04 | Disposition: A | Discharge status: Home / Self Care | Payer: Medicare Other | Source: Ambulatory Visit | Attending: Radiation Oncology | Admitting: Radiation Oncology

## 2013-07-04 ENCOUNTER — Encounter (HOSPITAL_COMMUNITY): Payer: Self-pay

## 2013-07-04 DIAGNOSIS — C099 Malignant neoplasm of tonsil, unspecified: Secondary | ICD-10-CM | POA: Insufficient documentation

## 2013-07-04 LAB — GLUCOSE, CAPILLARY: GLUCOSE-CAPILLARY: 131 mg/dL — AB (ref 70–99)

## 2013-07-04 MED ORDER — FLUDEOXYGLUCOSE F - 18 (FDG) INJECTION
17.9000 | Freq: Once | INTRAVENOUS | Status: AC | PRN
  Administered 2013-07-04: 17.9 via INTRAVENOUS

## 2013-07-06 ENCOUNTER — Ambulatory Visit (HOSPITAL_COMMUNITY): Payer: Self-pay | Admitting: Dentistry

## 2013-07-06 ENCOUNTER — Encounter (HOSPITAL_COMMUNITY): Payer: Self-pay | Admitting: Pharmacy Technician

## 2013-07-06 ENCOUNTER — Encounter (HOSPITAL_COMMUNITY): Payer: Self-pay | Admitting: Dentistry

## 2013-07-06 VITALS — BP 134/85 | HR 73 | Temp 98.7°F

## 2013-07-06 DIAGNOSIS — IMO0002 Reserved for concepts with insufficient information to code with codable children: Secondary | ICD-10-CM

## 2013-07-06 DIAGNOSIS — K0530 Chronic periodontitis, unspecified: Secondary | ICD-10-CM | POA: Insufficient documentation

## 2013-07-06 DIAGNOSIS — Z0189 Encounter for other specified special examinations: Secondary | ICD-10-CM

## 2013-07-06 DIAGNOSIS — M898X0 Other specified disorders of bone, multiple sites: Secondary | ICD-10-CM | POA: Insufficient documentation

## 2013-07-06 DIAGNOSIS — K0889 Other specified disorders of teeth and supporting structures: Secondary | ICD-10-CM | POA: Insufficient documentation

## 2013-07-06 DIAGNOSIS — K036 Deposits [accretions] on teeth: Secondary | ICD-10-CM

## 2013-07-06 DIAGNOSIS — M264 Malocclusion, unspecified: Secondary | ICD-10-CM

## 2013-07-06 DIAGNOSIS — C099 Malignant neoplasm of tonsil, unspecified: Secondary | ICD-10-CM

## 2013-07-06 DIAGNOSIS — K029 Dental caries, unspecified: Secondary | ICD-10-CM

## 2013-07-06 DIAGNOSIS — Z972 Presence of dental prosthetic device (complete) (partial): Secondary | ICD-10-CM

## 2013-07-06 DIAGNOSIS — K08109 Complete loss of teeth, unspecified cause, unspecified class: Secondary | ICD-10-CM

## 2013-07-06 NOTE — Patient Instructions (Signed)
Patient has a presurgical testing appointment tomorrow at 11:30 AM. Patient has operating room procedure scheduled for 07/08/2013 at 7:30 AM at [facility]. Patient is to followup with his primary dentist for fabrication of upper lower complete dentures after adequate healing and this needs to be 3 months after last radiation treatement. Patient to call if questions arise. Dr. Enrique Sack

## 2013-07-06 NOTE — Progress Notes (Signed)
DENTAL CONSULTATION  Date of Consultation:  07/06/2013 Patient Name:   Melvin [facility] Date of Birth:   1955/11/15 Medical Record Number: [id number]  VITALS: BP 134/85  Pulse 73  Temp(Src) 98.7 F (37.1 C) (Oral)   CHIEF COMPLAINT: Patient was referred for a pre-chemoradiation therapy dental protocol examination.  HPI: Melvin [facility] is a 58 year old male recently diagnosed with squamous cell carcinoma of the right tonsil. Patient with anticipated chemoradiation therapy. Patient is now seen as part of a medically necessary pre-chemoradiation therapy dental protocol examination.  Patient currently denies acute toothache, swellings, or abscesses. Patient indicates that he only has " two teeth left".  Patient indicates that he was last seen for multiple dental extractions with subsequent fabrication of upper complete denture and lower partial denture in 2007 or 2008. This was with a dentist in [place]. Patient indicates that the dentures" fit okay". Patient indicates that he did have a history of right mandible fracture, but cannot remember when that happened or what they did for treatment.    PROBLEM LIST: Patient Active Problem List   Diagnosis Date Noted  . Malignant neoplasm of tonsil 06/29/2013  . OSA (obstructive sleep apnea) 11/19/2011  . Angio-edema 09/17/2011  . hypotension  09/17/2011  . HTN (hypertension) 09/17/2011  . Gout 09/17/2011  . Tobacco abuse 09/17/2011    PMH: Past Medical History  Diagnosis Date  . Hypertension   . Anginal pain   . Angioedema 09/17/11    tongue and lips  . Arthritis     "both of my legs and feet"  . History of gout   . Obesity   . Tonsil cancer 06/14/13    Squamous cell carcinoma right tonsil    PSH: Past Surgical History  Procedure Laterality Date  . Knee arthroscopy  ~ 1994    right  . Multiple tooth extractions  ~ 2008    ALLERGIES: Allergies  Allergen Reactions  . Advil [Ibuprofen]     Feels "jittery"  . Azilsartan  Swelling    Avoid ARB and ACEI per MD    MEDICATIONS: Current Outpatient Prescriptions  Medication Sig Dispense Refill  . amLODipine (NORVASC) 10 MG tablet Take 10 mg by mouth daily.       [name] aspirin EC 81 MG tablet Take 81 mg by mouth daily.        . Cholecalciferol (VITAMIN D-3 PO) Take 1,000 Units by mouth daily.      . diphenhydrAMINE (BENADRYL) 25 MG tablet Take 1 tablet (25 mg total) by mouth every 6 (six) hours as needed for itching.  30 tablet  0  . metoprolol (LOPRESSOR) 50 MG tablet Take 50 mg by mouth daily. 1 tablet po daily      . oxyCODONE-acetaminophen (PERCOCET) 10-325 MG per tablet Take 1 tablet by mouth every 6 (six) hours as needed.       . polyethylene glycol (MIRALAX / GLYCOLAX) packet Take 17 g by mouth daily.      [name] spironolactone (ALDACTONE) 25 MG tablet Take 25 mg by mouth daily.      . tadalafil (CIALIS) 20 MG tablet Take 20 mg by mouth daily as needed.       No current facility-administered medications for this visit.    LABS: Lab Results  Component Value Date   WBC 10.2 06/30/2013   HGB 12.3* 06/30/2013   HCT 35.4* 06/30/2013   MCV 83.1 06/30/2013   PLT 334 06/30/2013      Component Value Date/Time   NA  141 06/30/2013 1438   NA 139 09/18/2011 0436   K 4.0 06/30/2013 1438   K 3.6 09/18/2011 0436   CL 102 09/18/2011 0436   CO2 24 06/30/2013 1438   CO2 26 09/18/2011 0436   GLUCOSE 119 06/30/2013 1438   GLUCOSE 213* 09/18/2011 0436   BUN 10.1 06/30/2013 1438   BUN 13 09/18/2011 0436   CREATININE 0.8 06/30/2013 1438   CREATININE 0.89 09/18/2011 0436   CALCIUM 9.7 06/30/2013 1438   CALCIUM 9.1 09/18/2011 0436   GFRNONAA >90 09/18/2011 0436   GFRAA >90 09/18/2011 0436   No results found for this basename: INR, PROTIME   No results found for this basename: PTT    SOCIAL HISTORY: History   Social History  . Marital Status: Legally Separated    Spouse Name: N/A    Number of Children: N/A  . Years of Education: N/A   Occupational History  . Not on file.   Social  History Main Topics  . Smoking status: Former Smoker -- 40 years    Types: Cigars    Quit date: 06/28/2004  . Smokeless tobacco: Never Used     Comment: Used to smoke cigs QUIT x 9 years ago.   . 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 Drug Use: No  . Sexual Activity: Not Currently   Other Topics Concern  . Not on file   Social History Narrative  . No narrative on file    FAMILY HISTORY: Family History  Problem Relation Age of Onset  . Cancer Sister     breast ca     REVIEW OF SYSTEMS: Reviewed with patient and included in dental record.   DENTAL HISTORY: CHIEF COMPLAINT: Patient was referred for a pre-chemoradiation therapy dental protocol examination.  HPI: Melvin Hudson is a 58 year old male recently diagnosed with squamous cell carcinoma of the right tonsil. Patient with anticipated chemoradiation therapy. Patient is now seen as part of a medically necessary pre-chemoradiation therapy dental protocol examination.  Patient currently denies acute toothache, swellings, or abscesses. Patient indicates that he only has " two teeth left".  Patient indicates that he was last seen for multiple dental extractions with subsequent fabrication of upper complete denture and lower partial denture in 2007 or 2008. This was with a dentist in [place]. Patient indicates that the dentures" fit okay". Patient indicates that he did have a history of right mandible fracture, but cannot remember when that happened or what they did for treatment.    DENTAL EXAMINATION:  GENERAL: The patient is a well-developed, obese male in no acute distress. HEAD AND NECK: There is right neck lymphadenopathy. Patient denies acute TMJ symptoms. INTRAORAL EXAM: Patient has normal saliva. There is no evidence of oral abscess formation. The patient has facial exostoses in the area of tooth numbers 21 through 28. The patient has a large right tonsillar mass consistent with  cancer. DENTITION: Patient has tooth numbers 22 and 27 remaining. PERIODONTAL: Patient has chronic periodontitis with plaque and calculus accumulations, gingival recession, and tooth mobility as charted. DENTAL CARIES/SUBOPTIMAL RESTORATIONS: Tooth numbers 22 and 27 are affected by dental caries. ENDODONTIC: Patient currently denies acute pulpitis symptoms. No obvious periapical radiolucencies are noted CROWN AND BRIDGE: There are no crown or bridge restorations. PROSTHODONTIC: The patient has an upper complete denture and lower cast partial denture that are fabricated approximately 7-8 years ago. This was in [place]. The dentures are clinically ill fitting. The patient is  missing the mandibular anterior denture teeth. OCCLUSION: The patient has a poor occlusal scheme associated with his existing upper complete and lower cast partial denture. Patient appears to be in a pseudo-class III malocclusion. RADIOGRAPHIC INTERPRETATION: An orthopantogram was taken and supplemented with 2 periapical radiographs. There are multiple missing teeth with the exception of tooth numbers 22 and 27. There is moderate bone loss noted. Dental caries are noted.There are no obvious periapical radiolucencies.  There is a wire  involving the border of the posterior right mandible consistent with a history of previous right mandible fracture.   ASSESSMENTS: 1. Squamous cell carcinoma of the right tonsil 2. Pre-chemoradiation therapy dental protocol examination 3. Multiple missing teeth with the exception of tooth numbers 22 and 27. 4. Chronic periodontitis with bone loss 5. Gingival recession 6. Tooth mobilityof tooth numbers 22 and 27  7. No evidence of periapical pathology or radiolucency 8. Ill fitting maxillary complete and lower partial dentures. 9.  Facial exostoses in the area of tooth numbers 21-28 10. Dental caries involving tooth numbers 22 and 27 11. Malocclusion  12. Potential for airway compromise  secondary to large right tonsillar mass  13. History of sleep apnea with current CPAP regimen   PLAN/RECOMMENDATIONS: 1. I discussed the risks, benefits, and complications of various treatment options with the patient in relationship to his medical and dental conditions, anticipated chemoradiation therapy, and chemoradiation therapy side effects to include xerostomia, radiation caries, trismus, mucositis, taste changes, gum and jawbone changes, and risk for infection and osteoradionecrosis. We discussed various treatment options to include no treatment, multiple extractions with alveoloplasty, pre-prosthetic surgery as indicated, periodontal therapy, dental restorations, root canal therapy, crown and bridge therapy, implant therapy, and replacement of missing teeth as indicated. The patient currently wishes to proceed with  extraction of tooth numbers 22 and 27 with alveoloplasty and pre-prosthetic surgery as indicated in the operating room on Friday, 07/08/2013 at 7:30 AM. Patient will most likely be kept overnight for observation secondary to history of sleep apnea and potential for airway compromise. Patient is currently scheduled for presurgical testing tomorrow at 11:30 AM. Patient may followup with a dentist of his choice for fabrication of upper and lower complete dentures after adequate healing and 3 months after his last radiation treatment as indicated.    2. Discussion of findings with medical team and coordination of future medical and dental care as needed.  I spent 75 minutes face to face with patient and more than 50% of time was spent in counseling and /or coordination of care.   Lenn Cal, DDS

## 2013-07-07 ENCOUNTER — Encounter (HOSPITAL_COMMUNITY)
Admission: RE | Admit: 2013-07-07 | Discharge: 2013-07-07 | Disposition: A | Discharge status: Home / Self Care | Payer: Medicare Other | Source: Ambulatory Visit | Attending: Dentistry | Admitting: Dentistry

## 2013-07-07 ENCOUNTER — Encounter (HOSPITAL_COMMUNITY): Payer: Self-pay

## 2013-07-07 ENCOUNTER — Ambulatory Visit (HOSPITAL_COMMUNITY)
Admission: RE | Admit: 2013-07-07 | Discharge: 2013-07-07 | Disposition: A | Discharge status: Home / Self Care | Payer: Medicare Other | Source: Ambulatory Visit | Attending: Anesthesiology | Admitting: Anesthesiology

## 2013-07-07 HISTORY — DX: Personal history of other diseases of the musculoskeletal system and connective tissue: Z87.39

## 2013-07-07 NOTE — Patient Instructions (Addendum)
Melvin Hudson  07/07/2013                           YOUR PROCEDURE IS SCHEDULED ON: 07/08/13 AT 7:30 AM               ENTER [place] ENTRANCE AND                            FOLLOW  SIGNS TO SHORT STAY CENTER                 ARRIVE AT SHORT STAY AT: 5:30 AM               CALL THIS NUMBER IF ANY PROBLEMS THE DAY OF SURGERY :               [phone]                                REMEMBER:   Do not eat food or drink liquids AFTER MIDNIGHT                 Take these medicines the morning of surgery with               A SIPS OF WATER :     AMLODIPINE / METOPROLOL / MAY TAKE PERCOCET IF NEEDED FOR PAIN              Bring c pap mask and tubing to hospital   Do not wear jewelry, make-up   Do not wear lotions, powders, or perfumes.   Do not shave legs or underarms 12 hrs. before surgery (men may shave face)  Do not bring valuables to the hospital.  Contacts, dentures or bridgework may not be worn into surgery.  Leave suitcase in the car. After surgery it may be brought to your room.  For patients admitted to the hospital more than one night, checkout time is            11:00 AM                                                       The day of discharge.   Patients discharged the day of surgery will not be allowed to drive home.            If going home same day of surgery, must have someone stay with you              FIRST 24 hrs at home and arrange for some one to drive you              home from hospital.   ________________________________________________________________________                                                                        [place]  Before surgery, you can play an important  role.  Because skin is not sterile, your skin needs to be as free of germs as possible.  You can reduce the number of germs on your skin by washing with CHG (chlorahexidine gluconate) soap before surgery.  CHG is an  antiseptic cleaner which kills germs and bonds with the skin to continue killing germs even after washing. Please DO NOT use if you have an allergy to CHG or antibacterial soaps.  If your skin becomes reddened/irritated stop using the CHG and inform your nurse when you arrive at Short Stay. Do not shave (including legs and underarms) for at least 48 hours prior to the first CHG shower.  You may shave your face. Please follow these instructions carefully:   1.  Shower with CHG Soap the night before surgery and the  morning of Surgery.   2.  If you choose to wash your hair, wash your hair first as usual with your  normal  Shampoo.   3.  After you shampoo, rinse your hair and body thoroughly to remove the  shampoo.                                         4.  Use CHG as you would any other liquid soap.  You can apply chg directly  to the skin and wash . Gently wash with scrungie or clean wascloth    5.  Apply the CHG Soap to your body ONLY FROM THE NECK DOWN.   Do not use on open                           Wound or open sores. Avoid contact with eyes, ears mouth and genitals (private parts).                        Genitals (private parts) with your normal soap.              6.  Wash thoroughly, paying special attention to the area where your surgery  will be performed.   7.  Thoroughly rinse your body with warm water from the neck down.   8.  DO NOT shower/wash with your normal soap after using and rinsing off  the CHG Soap .                9.  Pat yourself dry with a clean towel.             10.  Wear clean pajamas.             11.  Place clean sheets on your bed the night of your first shower and do not  sleep with pets.  Day of Surgery : Do not apply any lotions/deodorants the morning of surgery.  Please wear clean clothes to the hospital/surgery center.  FAILURE TO FOLLOW THESE INSTRUCTIONS MAY RESULT IN THE CANCELLATION OF YOUR SURGERY    PATIENT  SIGNATURE_________________________________

## 2013-07-07 NOTE — Anesthesia Preprocedure Evaluation (Addendum)
Anesthesia Evaluation  Patient identified by MRN, date of birth, ID band Patient awake  General Assessment Comment: solid mass lesion centered in the right palatine tonsil extends to the right tongue base measuring 4.3 x 2.9 x 4.1 cm. A right level 2 lymph node measures 3.2 x 1.0 cm. A more medial right level 2 lymph node is also worrisome for neoplasm, measuring 2.0 x 0.8 cm. No additional pathologic nodes are present on the right. No significant left-sided adenopathy is present.     Reviewed: Allergy & Precautions, H&P , NPO status , Patient's Chart, lab work & pertinent test results  Airway Mallampati: IV TM Distance: <3 FB Neck ROM: Full    Dental  (+) Missing, Loose, Poor Dentition   Pulmonary sleep apnea , former smoker,  breath sounds clear to auscultation  Pulmonary exam normal       Cardiovascular hypertension, Pt. on medications Rhythm:Regular Rate:Normal     Neuro/Psych negative neurological ROS  negative psych ROS   GI/Hepatic negative GI ROS, Neg liver ROS,   Endo/Other  Morbid obesity  Renal/GU negative Renal ROS  negative genitourinary   Musculoskeletal negative musculoskeletal ROS (+)   Abdominal (+) + obese,   Peds negative pediatric ROS (+)  Hematology negative hematology ROS (+)   Anesthesia Other Findings   Reproductive/Obstetrics negative OB ROS                          Anesthesia Physical Anesthesia Plan  ASA: III  Anesthesia Plan: MAC   Post-op Pain Management:    Induction: Intravenous  Airway Management Planned: Simple Face Mask  Additional Equipment:   Intra-op Plan:   Post-operative Plan:   Informed Consent: I have reviewed the patients History and Physical, chart, labs and discussed the procedure including the risks, benefits and alternatives for the proposed anesthesia with the patient or authorized representative who has indicated his/her  understanding and acceptance.   Dental advisory given  Plan Discussed with: CRNA  Anesthesia Plan Comments:         Anesthesia Quick Evaluation

## 2013-07-07 NOTE — Progress Notes (Signed)
Discussed with Dr. Enrique Sack.  Pt will have extractions and alveoloplasty 5/22. H/o large right tonsillar cancer and OSA.  Needs observation over night. PCP is Osei Bonsu.  TRH to admit after surgery. Please call flow manager when patient stable in PACU for admitting orders and H&P.  Doree Barthel, M.D. [facility]

## 2013-07-08 ENCOUNTER — Encounter (HOSPITAL_COMMUNITY): Payer: Self-pay

## 2013-07-08 ENCOUNTER — Encounter (HOSPITAL_COMMUNITY): Payer: Medicare Other | Admitting: Anesthesiology

## 2013-07-08 ENCOUNTER — Encounter (HOSPITAL_COMMUNITY)
Admission: RE | Disposition: A | Discharge status: Home / Self Care | Payer: Self-pay | Source: Ambulatory Visit | Attending: Internal Medicine

## 2013-07-08 ENCOUNTER — Ambulatory Visit (HOSPITAL_COMMUNITY): Payer: Medicare Other | Admitting: Anesthesiology

## 2013-07-08 ENCOUNTER — Observation Stay (HOSPITAL_COMMUNITY)
Admission: RE | Admit: 2013-07-08 | Discharge: 2013-07-09 | Disposition: A | Discharge status: Home / Self Care | Payer: Medicare Other | Source: Ambulatory Visit | Attending: Internal Medicine | Admitting: Internal Medicine

## 2013-07-08 DIAGNOSIS — Z79899 Other long term (current) drug therapy: Secondary | ICD-10-CM | POA: Insufficient documentation

## 2013-07-08 DIAGNOSIS — C090 Malignant neoplasm of tonsillar fossa: Secondary | ICD-10-CM | POA: Diagnosis present

## 2013-07-08 DIAGNOSIS — Z9889 Other specified postprocedural states: Secondary | ICD-10-CM

## 2013-07-08 DIAGNOSIS — K029 Dental caries, unspecified: Secondary | ICD-10-CM | POA: Insufficient documentation

## 2013-07-08 DIAGNOSIS — Z01810 Encounter for preprocedural cardiovascular examination: Secondary | ICD-10-CM | POA: Insufficient documentation

## 2013-07-08 DIAGNOSIS — G8918 Other acute postprocedural pain: Secondary | ICD-10-CM | POA: Insufficient documentation

## 2013-07-08 DIAGNOSIS — Z7982 Long term (current) use of aspirin: Secondary | ICD-10-CM | POA: Insufficient documentation

## 2013-07-08 DIAGNOSIS — Z87891 Personal history of nicotine dependence: Secondary | ICD-10-CM | POA: Insufficient documentation

## 2013-07-08 DIAGNOSIS — T783XXA Angioneurotic edema, initial encounter: Secondary | ICD-10-CM

## 2013-07-08 DIAGNOSIS — I959 Hypotension, unspecified: Secondary | ICD-10-CM

## 2013-07-08 DIAGNOSIS — M278 Other specified diseases of jaws: Secondary | ICD-10-CM | POA: Insufficient documentation

## 2013-07-08 DIAGNOSIS — Z0189 Encounter for other specified special examinations: Secondary | ICD-10-CM

## 2013-07-08 DIAGNOSIS — M109 Gout, unspecified: Secondary | ICD-10-CM

## 2013-07-08 DIAGNOSIS — Z01818 Encounter for other preprocedural examination: Secondary | ICD-10-CM | POA: Insufficient documentation

## 2013-07-08 DIAGNOSIS — G4733 Obstructive sleep apnea (adult) (pediatric): Secondary | ICD-10-CM | POA: Insufficient documentation

## 2013-07-08 DIAGNOSIS — Z01812 Encounter for preprocedural laboratory examination: Secondary | ICD-10-CM | POA: Insufficient documentation

## 2013-07-08 DIAGNOSIS — K0530 Chronic periodontitis, unspecified: Secondary | ICD-10-CM

## 2013-07-08 DIAGNOSIS — Z720 Tobacco use: Secondary | ICD-10-CM

## 2013-07-08 DIAGNOSIS — I10 Essential (primary) hypertension: Secondary | ICD-10-CM | POA: Insufficient documentation

## 2013-07-08 DIAGNOSIS — C099 Malignant neoplasm of tonsil, unspecified: Secondary | ICD-10-CM

## 2013-07-08 HISTORY — PX: MULTIPLE EXTRACTIONS WITH ALVEOLOPLASTY: SHX5342

## 2013-07-08 SURGERY — MULTIPLE EXTRACTION WITH ALVEOLOPLASTY
Anesthesia: Monitor Anesthesia Care

## 2013-07-08 MED ORDER — KETAMINE HCL 10 MG/ML IJ SOLN
INTRAMUSCULAR | Status: DC | PRN
  Administered 2013-07-08: 25 mg via INTRAVENOUS

## 2013-07-08 MED ORDER — LACTATED RINGERS IV SOLN: INTRAVENOUS | Status: DC

## 2013-07-08 MED ORDER — MORPHINE SULFATE 2 MG/ML IJ SOLN
INTRAMUSCULAR | Status: AC
  Administered 2013-07-08: 2 mg via INTRAVENOUS
  Filled 2013-07-08: qty 1

## 2013-07-08 MED ORDER — LIDOCAINE-EPINEPHRINE 2 %-1:100000 IJ SOLN
INTRAMUSCULAR | Status: AC
  Filled 2013-07-08: qty 10.2

## 2013-07-08 MED ORDER — OXYCODONE-ACETAMINOPHEN 5-325 MG PO TABS
1.0000 | ORAL_TABLET | ORAL | Status: DC | PRN
  Administered 2013-07-08 – 2013-07-09 (×3): 2 via ORAL
  Filled 2013-07-08 (×3): qty 2

## 2013-07-08 MED ORDER — KETAMINE HCL 10 MG/ML IJ SOLN
INTRAMUSCULAR | Status: AC
  Filled 2013-07-08: qty 1

## 2013-07-08 MED ORDER — MORPHINE SULFATE 2 MG/ML IJ SOLN: 2.0000 mg | INTRAMUSCULAR | Status: DC | PRN

## 2013-07-08 MED ORDER — DEXMEDETOMIDINE BOLUS VIA INFUSION
1.0000 ug/kg | Freq: Once | INTRAVENOUS | Status: DC
  Filled 2013-07-08: qty 154

## 2013-07-08 MED ORDER — PROPOFOL 10 MG/ML IV BOLUS
INTRAVENOUS | Status: AC
  Filled 2013-07-08: qty 20

## 2013-07-08 MED ORDER — ONDANSETRON HCL 4 MG/2ML IJ SOLN: 4.0000 mg | Freq: Four times a day (QID) | INTRAMUSCULAR | Status: DC | PRN

## 2013-07-08 MED ORDER — AMLODIPINE BESYLATE 10 MG PO TABS
10.0000 mg | ORAL_TABLET | Freq: Every morning | ORAL | Status: DC
  Administered 2013-07-09: 10 mg via ORAL
  Filled 2013-07-08: qty 1

## 2013-07-08 MED ORDER — LACTATED RINGERS IV SOLN: INTRAVENOUS | Status: DC | PRN

## 2013-07-08 MED ORDER — FENTANYL CITRATE 0.05 MG/ML IJ SOLN
INTRAMUSCULAR | Status: AC
  Filled 2013-07-08: qty 2

## 2013-07-08 MED ORDER — OXYCODONE-ACETAMINOPHEN 5-325 MG PO TABS: 1.0000 | ORAL_TABLET | ORAL | Status: DC | PRN

## 2013-07-08 MED ORDER — LIDOCAINE-EPINEPHRINE 2 %-1:100000 IJ SOLN
INTRAMUSCULAR | Status: DC | PRN
  Administered 2013-07-08: 5.1 mL via INTRADERMAL

## 2013-07-08 MED ORDER — DEXMEDETOMIDINE HCL IN NACL 200 MCG/50ML IV SOLN
INTRAVENOUS | Status: DC | PRN
  Administered 2013-07-08: 0.6 ug/kg/h via INTRAVENOUS

## 2013-07-08 MED ORDER — SODIUM CHLORIDE 0.9 % IV SOLN
INTRAVENOUS | Status: DC
  Administered 2013-07-08: 18:00:00 via INTRAVENOUS

## 2013-07-08 MED ORDER — BUPIVACAINE-EPINEPHRINE (PF) 0.5% -1:200000 IJ SOLN
INTRAMUSCULAR | Status: DC | PRN
  Administered 2013-07-08: 3.6 mL

## 2013-07-08 MED ORDER — DEXMEDETOMIDINE HCL 200 MCG/2ML IV SOLN
1.0000 ug/kg | Freq: Once | INTRAVENOUS | Status: DC
  Administered 2013-07-08: 153 ug via INTRAVENOUS
  Filled 2013-07-08: qty 1.54

## 2013-07-08 MED ORDER — POLYETHYLENE GLYCOL 3350 17 G PO PACK
17.0000 g | PACK | Freq: Every day | ORAL | Status: DC
  Administered 2013-07-08: 17 g via ORAL
  Filled 2013-07-08 (×3): qty 1

## 2013-07-08 MED ORDER — DEXTROSE 5 % IV SOLN
3.0000 g | Freq: Once | INTRAVENOUS | Status: AC
  Administered 2013-07-08: 3 g via INTRAVENOUS
  Filled 2013-07-08: qty 3000

## 2013-07-08 MED ORDER — DEXMEDETOMIDINE HCL IN NACL 200 MCG/50ML IV SOLN
0.4000 ug/kg/h | INTRAVENOUS | Status: DC
  Filled 2013-07-08 (×2): qty 50

## 2013-07-08 MED ORDER — LACTATED RINGERS IV SOLN
INTRAVENOUS | Status: DC | PRN
  Administered 2013-07-08: 07:00:00 via INTRAVENOUS

## 2013-07-08 MED ORDER — ISOPROPYL ALCOHOL 70 % SOLN
Status: DC | PRN
  Administered 2013-07-08: 1 via TOPICAL

## 2013-07-08 MED ORDER — MIDAZOLAM HCL 2 MG/2ML IJ SOLN
INTRAMUSCULAR | Status: AC
  Filled 2013-07-08: qty 2

## 2013-07-08 MED ORDER — BUPIVACAINE-EPINEPHRINE (PF) 0.5% -1:200000 IJ SOLN
INTRAMUSCULAR | Status: AC
  Filled 2013-07-08: qty 3.6

## 2013-07-08 MED ORDER — METOPROLOL SUCCINATE ER 50 MG PO TB24
50.0000 mg | ORAL_TABLET | Freq: Every morning | ORAL | Status: DC
  Administered 2013-07-09: 50 mg via ORAL
  Filled 2013-07-08: qty 1

## 2013-07-08 SURGICAL SUPPLY — 25 items
ATTRACTOMAT 16X20 MAGNETIC DRP (DRAPES) ×3 IMPLANT
BAG ZIPLOCK 12X15 (MISCELLANEOUS) IMPLANT
BANDAGE EYE OVAL (MISCELLANEOUS) ×6 IMPLANT
BLADE SURG 15 STRL LF DISP TIS (BLADE) ×2 IMPLANT
BLADE SURG 15 STRL SS (BLADE) ×4
CANNULA VESSEL W/WING WO/VALVE (CANNULA) ×3 IMPLANT
GAUZE SPONGE 4X4 16PLY XRAY LF (GAUZE/BANDAGES/DRESSINGS) ×3 IMPLANT
GLOVE SURG ORTHO 8.0 STRL STRW (GLOVE) ×3 IMPLANT
GLOVE SURG SS PI 6.0 STRL IVOR (GLOVE) ×3 IMPLANT
GOWN STRL REUS W/TWL 2XL LVL3 (GOWN DISPOSABLE) ×3 IMPLANT
GOWN STRL REUS W/TWL LRG LVL3 (GOWN DISPOSABLE) ×3 IMPLANT
KIT BASIN OR (CUSTOM PROCEDURE TRAY) ×3 IMPLANT
NS IRRIG 1000ML POUR BTL (IV SOLUTION) ×3 IMPLANT
PACK EENT SPLIT (PACKS) ×3 IMPLANT
PACKING VAGINAL (PACKING) ×3 IMPLANT
SPONGE GAUZE 4X4 12PLY (GAUZE/BANDAGES/DRESSINGS) ×3 IMPLANT
SUCTION FRAZIER 12FR DISP (SUCTIONS) IMPLANT
SUT CHROMIC 3 0 PS 2 (SUTURE) ×3 IMPLANT
SUT CHROMIC 4 0 P 3 18 (SUTURE) ×3 IMPLANT
SYR 50ML LL SCALE MARK (SYRINGE) ×3 IMPLANT
TOWEL OR 17X26 10 PK STRL BLUE (TOWEL DISPOSABLE) ×3 IMPLANT
TUBING CONNECTING 10 (TUBING) ×2 IMPLANT
TUBING CONNECTING 10' (TUBING) ×1
WATER STERILE IRR 1500ML POUR (IV SOLUTION) ×3 IMPLANT
YANKAUER SUCT BULB TIP NO VENT (SUCTIONS) ×3 IMPLANT

## 2013-07-08 NOTE — Op Note (Signed)
Patient:            Melvin Hudson Date of Birth:  Sep 13, 1955 MRN:                [id number]   DATE OF PROCEDURE:  07/08/2013               OPERATIVE REPORT   PREOPERATIVE DIAGNOSES: 1. Squamous cell carcinoma of right tonsil 2. Pre-chemoradiation therapy dental protocol 3. Chronic periodontitis 4. Dental caries 5. Bilateral mandibular facial exostoses 6. Tooth mobility  POSTOPERATIVE DIAGNOSES: 1. Squamous cell carcinoma of right tonsil 2. Pre-chemoradiation therapy dental protocol 3. Chronic periodontitis 4. Dental caries 5. Bilateral mandibular facial exostoses 6. Tooth mobility  OPERATIONS: 1. Multiple extraction of tooth numbers 22 and 27 2. 2 Quadrants of alveoloplasty 3. Bilateral mandibular facial exostoses reductions   SURGEON: Lenn Cal, DDS  ASSISTANT: Camie Patience, (dental assistant)  ANESTHESIA: Monitored anesthesia care per the anesthesia team  MEDICATIONS: 1. Ancef 3 Grams IV prior to invasive dental procedures. 2. Local anesthesia with a total utilization of 3 carpules each containing 34 mg of lidocaine with 0.017 mg of epinephrine as well as 2 carpules each containing 9 mg of bupivacaine with 0.009 mg of epinephrine.  SPECIMENS: There are 2 teeth that were discarded.  DRAINS: None  CULTURES: None  COMPLICATIONS: None   ESTIMATED BLOOD LOSS: Less than 25 mLs.  INTRAVENOUS FLUIDS: Lactated ringers solution per the anesthesia team records  INDICATIONS: The patient was recently diagnosed with squamous cell carcinoma of the right tonsil.  A dental consultation was then requested to evaluate poor dentition.  The patient was examined and treatment planned for extraction remaining lower teeth with alveoloplasty and pre-prosthetic surgery as indicated..  This treatment plan was formulated to decrease the risks and complications associated with dental infection from affecting the patient's systemic health and to prevent future complications such as  osteoradionecrosis.  OPERATIVE FINDINGS: Patient was examined operating room number 6.  The teeth were identified for extraction. The patient was noted be affected by chronic periodontitis, dental caries, bilateral mandibular facial exostoses, and tooth mobility.   DESCRIPTION OF PROCEDURE: Patient was brought to the main operating room number 6. Patient was then placed in the supine position on the operating table. Monitored anesthesia care was then induced per the anesthesia team. The patient was then prepped and draped in the usual manner for dental medicine procedure. A timeout was performed. The patient was identified and procedures were verified. The oral cavity was then thoroughly examined with the findings noted above. The patient was then ready for dental medicine procedure as follows:  Local anesthesia was then administered sequentially with a total utilization of 3 carpules each containing 34 mg of lidocaine with 0.017 mg of epinephrine as well as 2 carpules  each containing 9 mg bupivacaine with 0.009 mg of epinephrine.  At this point time, the mandibular quadrants were approached. The patient was given bilateral inferior alveolar nerve blocks and long buccal nerve blocks utilizing the bupivacaine with epinephrine. Further infiltration was then achieved utilizing the lidocaine with epinephrine. A 15 blade incision was then made from the distal of number 20 and extended to the distal of #29 .  A surgical flap was then carefully reflected.  Tooth numbers 22 and 27 were then removed with a 151 forceps without complications. Alveoloplasty was then performed utilizing a rongeurs and bone file. The mandibular facial exostoses were then visualized and removed with a rongeur and bone file appropriately. Further alveoloplasty  was then performed as needed with a rongeur and bone file.  The redundant tissues were approximated and trimmed appropriately. The surgical sites were then irrigated with copious  amounts of sterile saline x4. The mandibular left surgical site was then closed from the distal of  #20 and extended the mesial numbers 24 utilizing 3-0 chromic gut suture in a continuous interrupted suture technique x1. The mandibular right surgical site was then closed from the distal of #29 and extended the mesial #25 utilizing 3-0 chromic gut suture in a continuous interrupted suture technique x1. 2 individual interrupted sutures were then placed in the area of #27 to further closed surgical site as needed.  At this point time, the entire mouth was irrigated with copious amounts of sterile saline. The patient was examined for complications, seeing none, the dental medicine procedure was deemed to be complete.  A series of 4 x 4 gauze were placed in the mouth to aid hemostasis. The patient was then handed over to the anesthesia team for final disposition. After an appropriate amount of time, the patient was  taken to the postanesthsia care unit with stable vital signs and a good condition. All counts were correct for the dental medicine procedure. The patient will be admitted by Dr. Broadus John for overnight observation due to with history of sleep apnea and poor social situation. Patient will then be discharged in the morning as indicated. Patient is acceptable for discharge from a dental standpoint. Patient will return to dental medicine in approximately 7-10 days for evaluation for suture removal.   Lenn Cal, DDS.

## 2013-07-08 NOTE — Progress Notes (Signed)
CPAP mask and tubing in SS. Does not know settings

## 2013-07-08 NOTE — Progress Notes (Signed)
Pt placed on CPAP machine. Pt was unsure of his home settings.  RT titrated settings to patients comfort level.  Machine is set to 10cmH20 and patient stated that it feels like the same pressure of his at home.  Patient his using his home mask and tubing but the hospital machine.  RT will continue to monitor.

## 2013-07-08 NOTE — Anesthesia Postprocedure Evaluation (Signed)
  Anesthesia Post-op Note  Patient: Melvin Hudson  Procedure(s) Performed: Procedure(s) (LRB): Extraction of tooth #'s 29, 30 with alveoloplasty and bilateal mandibular facial exostoses reductions (N/A)  Patient Location: PACU  Anesthesia Type: MAC  Level of Consciousness: awake and alert   Airway and Oxygen Therapy: Patient Spontanous Breathing  Post-op Pain: mild  Post-op Assessment: Post-op Vital signs reviewed, Patient's Cardiovascular Status Stable, Respiratory Function Stable, Patent Airway and No signs of Nausea or vomiting  Last Vitals:  Filed Vitals:   07/08/13 1006  BP: 115/71  Pulse: 67  Temp: 36.4 C  Resp: 18    Post-op Vital Signs: stable   Complications: No apparent anesthesia complications

## 2013-07-08 NOTE — H&P (Signed)
[facility] History and Physical  Melvin Hudson ZOX:[id number] DOB: May 12, 1955 DOA: 07/08/2013  Referring physician: Dr.Kulinski PCP: No primary provider on file.   Chief Complaint: post op observation  HPI: Melvin Hudson is a 58 y.o. male with newly diagnosed T3N2b (PET Pending) Squamous cell CA of R tonsil, with dental caries/periodontitis admitted for pre-radiation therapy Dental protocol, just underwent multiple tooth extractions and 2 quadrant alveoloplasty this am and post op observation overnight requested by Dr.Kunlinski. He also has h/o HTN, OSA, Gout and Arthritis. Currently denies any complaints except for pain in his mouth at the surgical site   Review of Systems: 12 system review negative except per HPI Constitutional:  No weight loss, night sweats, Fevers, chills, fatigue.  HEENT:  No headaches, Difficulty swallowing,Tooth/dental problems,Sore throat,  No sneezing, itching, ear ache, nasal congestion, post nasal drip,  Cardio-vascular:  No chest pain, Orthopnea, PND, swelling in lower extremities, anasarca, dizziness, palpitations  GI:  No heartburn, indigestion, abdominal pain, nausea, vomiting, diarrhea, change in bowel habits, loss of appetite  Resp:  No shortness of breath with exertion or at rest. No excess mucus, no productive cough, No non-productive cough, No coughing up of blood.No change in color of mucus.No wheezing.No chest wall deformity  Skin:  no rash or lesions.  GU:  no dysuria, change in color of urine, no urgency or frequency. No flank pain.  Musculoskeletal:  No joint pain or swelling. No decreased range of motion. No back pain.  Psych:  No change in mood or affect. No depression or anxiety. No memory loss.   Past Medical History  Diagnosis Date  . Hypertension   . Angioedema 09/17/11    tongue and lips  . Arthritis     "both of my legs and feet"  . Obesity   . Tonsil cancer 06/14/13    Squamous cell carcinoma right tonsil  . Hx of  gout   . Sleep apnea     USES C PAP   Past Surgical History  Procedure Laterality Date  . Knee arthroscopy  ~ 1994    right  . Multiple tooth extractions  ~ 2008   Social History:  reports that he quit smoking about 9 years ago. His smoking use included Cigars. He has never used smokeless tobacco. He reports that he does not drink alcohol or use illicit drugs.  Allergies  Allergen Reactions  . Advil [Ibuprofen]     Feels "jittery"  . Azilsartan Swelling    Avoid ARB and ACEI per MD    Family History  Problem Relation Age of Onset  . Cancer Sister     breast ca     Prior to Admission medications   Medication Sig Start Date End Date Taking? Authorizing Provider  amLODipine (NORVASC) 10 MG tablet Take 10 mg by mouth every morning.    Yes Historical Provider, MD  aspirin EC 81 MG tablet Take 81 mg by mouth daily.     Yes Historical Provider, MD  Cholecalciferol (VITAMIN D-3 PO) Take 1,000 Units by mouth daily.   Yes Historical Provider, MD  metoprolol succinate (TOPROL-XL) 50 MG 24 hr tablet Take 50 mg by mouth every morning. Take with or immediately following a meal.   Yes Historical Provider, MD  oxyCODONE-acetaminophen (PERCOCET) 10-325 MG per tablet Take 1 tablet by mouth 3 (three) times daily as needed for pain.    Yes Historical Provider, MD  polyethylene glycol (MIRALAX / GLYCOLAX) packet Take 17 g by mouth daily.   Yes  Historical Provider, MD  spironolactone (ALDACTONE) 25 MG tablet Take 25 mg by mouth every morning.  09/18/11  Yes Debbe Odea, MD  tadalafil (CIALIS) 20 MG tablet Take 20 mg by mouth daily as needed for erectile dysfunction.    Yes Historical Provider, MD  diphenhydrAMINE (BENADRYL) 25 MG tablet Take 1 tablet (25 mg total) by mouth every 6 (six) hours as needed for itching. 09/18/11   Debbe Odea, MD   Physical Exam: Filed Vitals:   07/08/13 1006  BP: 115/71  Pulse: 67  Temp: 97.5 F (36.4 C)  Resp: 18    BP 115/71  Pulse 67  Temp(Src) 97.5 F (36.4  C) (Oral)  Resp 18  Wt 153.3 kg (337 lb 15.4 oz)  SpO2 100%  General:  Appears calm and comfortable, obese male, no distress HEENT: PERRLA, edentulous mouth, Ice packs overlying the cheeks and chin, no bleeding visualized,  gum swelling noted Cardiovascular: RRR, no m/r/g. No LE edema. Respiratory: CTA bilaterally, no w/r/r. Normal respiratory effort. Abdomen: soft, ntnd Skin: no rash or induration seen on limited exam Musculoskeletal: grossly normal tone BUE/BLE Psychiatric: grossly normal mood and affect, speech fluent and appropriate Neurologic: grossly non-focal.          Labs on Admission:  Basic Metabolic Panel: No results found for this basename: NA, K, CL, CO2, GLUCOSE, BUN, CREATININE, CALCIUM, MG, PHOS,  in the last 168 hours Liver Function Tests: No results found for this basename: AST, ALT, ALKPHOS, BILITOT, PROT, ALBUMIN,  in the last 168 hours No results found for this basename: LIPASE, AMYLASE,  in the last 168 hours No results found for this basename: AMMONIA,  in the last 168 hours CBC: No results found for this basename: WBC, NEUTROABS, HGB, HCT, MCV, PLT,  in the last 168 hours Cardiac Enzymes: No results found for this basename: CKTOTAL, CKMB, CKMBINDEX, TROPONINI,  in the last 168 hours  BNP (last 3 results) No results found for this basename: PROBNP,  in the last 8760 hours CBG:  Recent Labs Lab 07/04/13 1110  GLUCAP 131*    Radiological Exams on Admission: Dg Chest 2 View  07/07/2013   CLINICAL DATA:  Preop dental extractions, high blood pressure  EXAM: CHEST  2 VIEW  COMPARISON:  None.  FINDINGS: The heart size and mediastinal contours are within normal limits. Both lungs are clear. The visualized skeletal structures are unremarkable.  IMPRESSION: No active cardiopulmonary disease.   Electronically Signed   By: Skipper Cliche M.D.   On: 07/07/2013 14:26    Assessment/Plan    Malignant neoplasm of tonsil -FU with Onc and Dr.Squire for XRT    s/p Dental; extractions and alveoplasty for Pre-radiation therapy Dental protocol -admitted overnight for observation per Dr.Kulinskis request -clears advance as tolerated to soft diet -CPAP tonight -Home tomorrow and Fu with Dr.Kulinksi in 1 week for suture removal  HTN -stable,  Resume metoprolol, hold aldactone  OSA -CPAP QHS per RT  Former heavy smoker -quit 2 weeks ago, lungs clear  DVT proph: SCDs tonight due to risk of bleeding  Code Status: Family Communication:  Disposition Plan: overnight observation  Time spent: 5min  Melvin Hudson [facility] Pager [phone]  **Disclaimer: This note may have been dictated with voice recognition software. Similar sounding words can inadvertently be transcribed and this note may contain transcription errors which may not have been corrected upon publication of note.**

## 2013-07-08 NOTE — Progress Notes (Signed)
[facility]  Patient Status: ACTIVE  [facility] is providing the following services: RN  If patient discharges after hours, please call [phone].   Melvin Hudson 07/08/2013, 12:29 PM

## 2013-07-08 NOTE — H&P (Signed)
07/08/2013  Patient:            Melvin Hudson Date of Birth:  April 09, 1955 MRN:                [id number]  BP 141/88  Pulse 76  Temp(Src) 98 F (36.7 C) (Oral)  Resp 18  SpO2 100%   Melvin Hudson presents for extraction remaining teeth with alveoloplasty and pre-prosthetic surgery as indicated in the operating room anesthesia. Patient denies any acute medical or dental changes. Please see consultation note from 06/30/2013 from Dr. Alvy Bimler to use as his H&P for the dental operating room procedure today.  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 male is here because of newly diagnosed squamous cell carcinoma of the right tonsil. According to the patient, the first initial presentation was due to sensation of sore throat and fullness in the right neck approximately 3 months ago.Melvin Hudson He was prescribed antibiotics and he was not improving. He started to present with hemoptysis 1-2 months ago and is currently daily. He complained of painful swallowing but no dysphagia. He has lost an unspecified amount of weight. He developed recent constipation. he denies any hearing deficit, difficulties with chewing food or changes in the quality of his voice. On 06/14/13 , he underwent ENT evaluation and was found to have a large tonsillar mass and abnormal lymphadenopathy. CT scan confirmed locally advanced disease.   Pathology from biopsy of the right tonsil confirmed diagnosis of squamous cell carcinoma, HPV status unknown. MEDICAL HISTORY:   Past Medical History   Diagnosis  Date   .  Hypertension     .  Anginal pain      .  Angioedema  09/17/11       tongue and lips   .  Arthritis         "both of my legs and feet"   .  History of gout     .  Obesity          SURGICAL HISTORY: Past Surgical History   Procedure  Laterality  Date   .  Knee arthroscopy    ~ 1994       right   .  Multiple tooth extractions    ~ 2008        SOCIAL HISTORY: History       Social History   .  Marital Status:  Married       Spouse Name:  N/A       Number of Children:  N/A   .  Years of Education:  N/A       Occupational History   .  Not on file.       Social History Main Topics   .  Smoking status:  Former Smoker -- 40 years       Types:  Cigars       Quit date:  06/28/2004   .  Smokeless tobacco:  Never Used         Comment: Used to smoke cigs QUIT x 9 years ago.    .  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  Drug Use:  No   .  Sexual Activity:  Not Currently       Other Topics  Concern   .  Not on file       Social History Narrative   .  No narrative on file        FAMILY HISTORY: Family History   Problem  Relation  Age of Onset   .  Cancer  Sister         breast ca        ALLERGIES:  is allergic to advil and azilsartan.   MEDICATIONS:   Current Outpatient Prescriptions   Medication  Sig  Dispense  Refill   .  amLODipine (NORVASC) 10 MG tablet  Take 10 mg by mouth daily.          Melvin Hudson  aspirin EC 81 MG tablet  Take 81 mg by mouth daily.           .  Cholecalciferol (VITAMIN D-3 PO)  Take 1,000 Units by mouth daily.         .  diphenhydrAMINE (BENADRYL) 25 MG tablet  Take 1 tablet (25 mg total) by mouth every 6 (six) hours as needed for itching.   30 tablet   0   .  metoprolol (LOPRESSOR) 50 MG tablet  Take 50 mg by mouth daily. 1 tablet po daily         .  oxyCODONE-acetaminophen (PERCOCET) 10-325 MG per tablet  Take 1 tablet by mouth every 6 (six) hours as needed.          .  polyethylene glycol (MIRALAX / GLYCOLAX) packet  Take 17 g by mouth daily.          Melvin Hudson  spironolactone (ALDACTONE) 25 MG tablet  Take 25 mg by mouth daily.         .  tadalafil (CIALIS) 20 MG tablet  Take 20 mg by mouth daily as needed.             No current facility-administered medications for this visit.        REVIEW OF SYSTEMS:    Constitutional: Denies fevers, chills or abnormal night sweats Eyes: Denies blurriness of vision, double vision or watery eyes Respiratory: Denies cough, dyspnea or wheezes Cardiovascular: Denies palpitation, chest discomfort or lower extremity swelling Gastrointestinal:  Denies nausea, heartburn or change in bowel habits Skin: Denies abnormal skin rashes Neurological:Denies numbness, tingling or new weaknesses Behavioral/Psych: Mood is stable, no new changes   All other systems were reviewed with the patient and are negative.   PHYSICAL EXAMINATION: ECOG PERFORMANCE STATUS: 1 - Symptomatic but completely ambulatory    Filed Vitals:     06/30/13 1341   BP:  151/89   Pulse:  74   Temp:  98 F (36.7 C)   Resp:  19       Filed Weights     06/30/13 1341   Weight:  340 lb 4.8 oz (154.359 kg)        GENERAL:alert, no distress and comfortable. He is morbidly obese SKIN: skin color, texture, turgor are normal, no rashes or significant lesions EYES: normal, conjunctiva are pink and non-injected, sclera clear OROPHARYNX: Noted oropharyngeal mass in the right tonsil affecting the right palate. Poor dentition is noted NECK: supple, thyroid normal size, non-tender, without nodularity LYMPH:  Palpable lymphadenopathy on the right side of the neck.   LUNGS: clear to auscultation  and percussion with normal breathing effort HEART: regular rate & rhythm and no murmurs and no lower extremity edema ABDOMEN:abdomen soft, non-tender and normal bowel sounds Musculoskeletal:no cyanosis of digits and no clubbing   PSYCH: alert & oriented x 3 with fluent speech NEURO: no focal motor/sensory deficits   LABORATORY DATA:   I have  reviewed the data as listed Lab Results   Component  Value  Date     WBC  10.2  06/30/2013     HGB  12.3*  06/30/2013     HCT  35.4*  06/30/2013     MCV  83.1  06/30/2013     PLT  334  06/30/2013       Lab Results   Component  Value  Date     NA  141  06/30/2013     K  4.0  06/30/2013     CL  102  09/18/2011     CO2  24  06/30/2013        RADIOGRAPHIC STUDIES: I have personally reviewed the radiological images as listed and agreed with the findings in the report. Ct Soft Tissue Neck W Contrast   06/23/2013   CLINICAL DATA:  Tonsil cancer.  Right neck lump.  EXAM: CT NECK WITH CONTRAST  TECHNIQUE: Multidetector CT imaging of the neck was performed using the standard protocol following the bolus administration of intravenous contrast.  CONTRAST:  159mL OMNIPAQUE IOHEXOL 300 MG/ML  SOLN  COMPARISON:  None.  FINDINGS: A solid mass lesion centered in the right palatine tonsil extends to the right tongue base measuring 4.3 x 2.9 x 4.1 cm. A right level 2 lymph node measures 3.2 x 1.0 cm. A more medial right level 2 lymph node is also worrisome for neoplasm, measuring 2.0 x 0.8 cm. No additional pathologic nodes are present on the right. No significant left-sided adenopathy is present.  No other focal mucosal or submucosal lesion is present. The vocal cords are midline and symmetric. The thyroid is unremarkable.  Bone windows demonstrate previous fusion at C5-6. Adjacent level osteophytes at C4-5 contribute to foraminal narrowing bilaterally, right greater than left. Uncovertebral disease is evident from C3-4 through C6-7. Vertebral body heights and alignment are maintained.  Wires are present along the inferior aspect of the right mandibular ramus. T mandibular teeth remain. The patient is otherwise edentulous.  IMPRESSION: 1. 4.3 x 2.9 x 4.1 cm right tonsil and peritonsillar mass is concerning for primary neoplasm. 2. Enlarged right level 2 lymph nodes are likely metastases. 3. Moderate spondylosis of the  cervical spine with fusion at C5-6.   Electronically Signed   By: Lawrence Santiago M.D.   On: 06/23/2013 13:58       ASSESSMENT:   Newly diagnosed squamous cell carcinoma of the Head & Neck, HPV N/A   PLAN:   #1 right tonsil cancer with regional lymph nodes involvement Stage of the disease is to be determined, a PET/CT scan has been ordered.    Prognosis would depend on the results for the PET/CT scan, to be discussed and reviewed in the next visit.    Treatment options would include chemotherapy only, radiation only or chemotherapy in combination with radiation therapy.    I am highly doubtful that the patient is a candidate for concurrent chemoradiation therapy. He has very poor social circumstances with difficulties with transportation. With his significant abdominal obesity, placement of open gastrostomy tube would be very difficult. I am waiting for his PET/CT scan result. I  will discuss further with the radiation oncologist for potential treatment with radiation only without chemotherapy. I agree with referral to nutritionist and dentist I will order blood work today to assess his baseline kidney function and blood count.        Orders Placed This Encounter   Procedures   .  CBC with Differential       Standing Status:  Future         Number of Occurrences:  1         Standing Expiration Date:  06/30/2014   .  Comprehensive metabolic panel       Standing Status:  Future         Number of Occurrences:  1         Standing Expiration Date:  06/30/2014   .  Uric Acid       Standing Status:  Future         Number of Occurrences:  1         Standing Expiration Date:  06/30/2014   .  Lactate dehydrogenase       Standing Status:  Future         Number of Occurrences:  1         Standing Expiration Date:  06/30/2014        All questions were answered. The patient knows to call the clinic with any problems, questions or concerns. I spent 55 minutes counseling the patient  face to face. The total time spent in the appointment was 60 minutes and more than 50% was on counseling.      Heath Lark, MD 06/30/2013 3:44 PM

## 2013-07-08 NOTE — Transfer of Care (Signed)
Immediate Anesthesia Transfer of Care Note  Patient: Melvin Hudson  Procedure(s) Performed: Procedure(s) (LRB): Extraction of tooth #'s 24, 4 with alveoloplasty and bilateal mandibular facial exostoses reductions (N/A)  Patient Location: PACU  Anesthesia Type: MAC  Level of Consciousness: sedated, patient cooperative and responds to stimulation  Airway & Oxygen Therapy: Patient Spontanous Breathing and Patient connected to face mask oxgen  Post-op Assessment: Report given to PACU RN and Post -op Vital signs reviewed and stable  Post vital signs: Reviewed and stable  Complications: No apparent anesthesia complications

## 2013-07-08 NOTE — Progress Notes (Signed)
PRE-OPERATIVE NOTE:  07/08/2013 [name] [id number]  VITALS: BP 141/88  Pulse 76  Temp(Src) 98 F (36.7 C) (Oral)  Resp 18  SpO2 100%  Lab Results  Component Value Date   WBC 10.2 06/30/2013   HGB 12.3* 06/30/2013   HCT 35.4* 06/30/2013   MCV 83.1 06/30/2013   PLT 334 06/30/2013   BMET    Component Value Date/Time   NA 141 06/30/2013 1438   NA 139 09/18/2011 0436   K 4.0 06/30/2013 1438   K 3.6 09/18/2011 0436   CL 102 09/18/2011 0436   CO2 24 06/30/2013 1438   CO2 26 09/18/2011 0436   GLUCOSE 119 06/30/2013 1438   GLUCOSE 213* 09/18/2011 0436   BUN 10.1 06/30/2013 1438   BUN 13 09/18/2011 0436   CREATININE 0.8 06/30/2013 1438   CREATININE 0.89 09/18/2011 0436   CALCIUM 9.7 06/30/2013 1438   CALCIUM 9.1 09/18/2011 0436   GFRNONAA >90 09/18/2011 0436   GFRAA >90 09/18/2011 0436    No results found for this basename: INR, PROTIME   No results found for this basename: PTT     [name] presents for  extraction of remaining teeth with alveoloplasty and pre-prosthetic surgery as indicated in the operating room and monitored anesthesia care.    SUBJECTIVE: The patient denies any acute medical or dental changes and agrees to proceed with treatment as planned.  EXAM: No sign of acute dental changes.  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  PLAN: Patient agrees to proceed with treatment as planned in the operating room as previously discussed and accepts the risks, benefits, complications of the proposed treatment. the patient is aware of the potential for complications include bleeding, bruising, swelling, pain, infection, root tip fracture, mandible fracture, nerve damage, soft tissue damage, respiratory complications, and risk for general anesthesia. She also is aware of the potential for complications not mentioned above.    Lenn Cal, DDS

## 2013-07-08 NOTE — Discharge Instructions (Signed)

## 2013-07-09 DIAGNOSIS — K1370 Unspecified lesions of oral mucosa: Secondary | ICD-10-CM

## 2013-07-09 DIAGNOSIS — C099 Malignant neoplasm of tonsil, unspecified: Secondary | ICD-10-CM

## 2013-07-09 LAB — CBC
HCT: 34.1 % — ABNORMAL LOW (ref 39.0–52.0)
Hemoglobin: 11.7 g/dL — ABNORMAL LOW (ref 13.0–17.0)
MCH: 28.3 pg (ref 26.0–34.0)
MCHC: 34.3 g/dL (ref 30.0–36.0)
MCV: 82.6 fL (ref 78.0–100.0)
Platelets: 334 10*3/uL (ref 150–400)
RBC: 4.13 MIL/uL — ABNORMAL LOW (ref 4.22–5.81)
RDW: 16.8 % — AB (ref 11.5–15.5)
WBC: 11.7 10*3/uL — AB (ref 4.0–10.5)

## 2013-07-09 LAB — BASIC METABOLIC PANEL
BUN: 9 mg/dL (ref 6–23)
CALCIUM: 9.2 mg/dL (ref 8.4–10.5)
CO2: 28 mEq/L (ref 19–32)
CREATININE: 0.94 mg/dL (ref 0.50–1.35)
Chloride: 98 mEq/L (ref 96–112)
GFR calc Af Amer: >90 mL/min (ref >90)
GFR calc non Af Amer: 90 mL/min — ABNORMAL LOW (ref >90)
Glucose, Bld: 138 mg/dL — ABNORMAL HIGH (ref 70–99)
Potassium: 4.2 mEq/L (ref 3.7–5.3)
SODIUM: 137 meq/L (ref 137–147)

## 2013-07-09 MED ORDER — OXYCODONE-ACETAMINOPHEN 10-325 MG PO TABS: 1.0000 | ORAL_TABLET | Freq: Three times a day (TID) | ORAL | Status: DC | PRN

## 2013-07-09 NOTE — Discharge Summary (Signed)
Physician Discharge Summary  Melvin Hudson WUJ:[id number] DOB: 07-22-1955 DOA: 07/08/2013  PCP: No primary provider on file.  Admit date: 07/08/2013 Discharge date: 07/09/2013  Time spent: 25 minutes  Recommendations for Outpatient Follow-up:  -Will be discharged home today. -Advised to follow with Dr. Enrique Sack as scheduled.   Discharge Diagnoses:  Active Problems:   Malignant neoplasm of tonsil   Post-operative state   Post-op pain   Discharge Condition: Stable and improved  Filed Weights   07/08/13 0700 07/09/13 0100  Weight: 153.3 kg (337 lb 15.4 oz) 153.3 kg (337 lb 15.4 oz)    History of present illness:  Melvin Hudson is a 58 y.o. male with newly diagnosed T3N2b (PET Pending) Squamous cell CA of R tonsil, with dental caries/periodontitis admitted for pre-radiation therapy Dental protocol, just underwent multiple tooth extractions and 2 quadrant alveoloplasty this am and post op observation overnight requested by Dr.Kunlinski.  He also has h/o HTN, OSA, Gout and Arthritis.  Currently denies any complaints except for pain in his mouth at the surgical site. Hospitalist admission was requested.   Hospital Course:   Malignant Neoplasm of Tonsil -To follow up with Onc and rad onc as scheduled.  S/p Dental Extractions and Alveoloplasty -For pre-radiation dental protocol. -Dr. Enrique Sack wanted him admitted overnight for observation and pain management as he has no family at home. -Tolerating diet; minimal pain. -Will DC home today; will need to follow with Dr. Enrique Sack as scheduled by his office.  HTN -Stable -Continue home meds.  OSA -qHs CPAP.   Discharge Instructions  Discharge Instructions   Consult to dietitian    Complete by:  As directed   Patient is edentulous. Assess nutritional requirements.  Patient is edentulous.     Diet - low sodium heart healthy    Complete by:  As directed      Discontinue IV    Complete by:  As directed      Gauze     Complete by:  As directed   4 x 4 gauze to oral bleeding sites until oozing stops.     Increase activity slowly    Complete by:  As directed             Medication List         amLODipine 10 MG tablet  Commonly known as:  NORVASC  Take 10 mg by mouth every morning.     aspirin EC 81 MG tablet  Take 81 mg by mouth daily.     diphenhydrAMINE 25 MG tablet  Commonly known as:  BENADRYL  Take 1 tablet (25 mg total) by mouth every 6 (six) hours as needed for itching.     metoprolol succinate 50 MG 24 hr tablet  Commonly known as:  TOPROL-XL  Take 50 mg by mouth every morning. Take with or immediately following a meal.     oxyCODONE-acetaminophen 10-325 MG per tablet  Commonly known as:  PERCOCET  Take 1 tablet by mouth 3 (three) times daily as needed for pain.     polyethylene glycol packet  Commonly known as:  MIRALAX / GLYCOLAX  Take 17 g by mouth daily.     spironolactone 25 MG tablet  Commonly known as:  ALDACTONE  Take 25 mg by mouth every morning.     tadalafil 20 MG tablet  Commonly known as:  CIALIS  Take 20 mg by mouth daily as needed for erectile dysfunction.     VITAMIN D-3 PO  Take 1,000  Units by mouth daily.       Allergies  Allergen Reactions  . Advil [Ibuprofen]     Feels "jittery"  . Azilsartan Swelling    Avoid ARB and ACEI per MD       Follow-up Information   Follow up with Lenn Cal, DDS. Schedule an appointment as soon as possible for a visit on 07/18/2013. (For suture removal)    Specialty:  Dentistry   Contact information:   [place] [address] [phone]        The results of significant diagnostics from this hospitalization (including imaging, microbiology, ancillary and laboratory) are listed below for reference.    Significant Diagnostic Studies: Dg Chest 2 View  07/07/2013   CLINICAL DATA:  Preop dental extractions, high blood pressure  EXAM: CHEST  2 VIEW  COMPARISON:  None.  FINDINGS: The heart  size and mediastinal contours are within normal limits. Both lungs are clear. The visualized skeletal structures are unremarkable.  IMPRESSION: No active cardiopulmonary disease.   Electronically Signed   By: Skipper Cliche M.D.   On: 07/07/2013 14:26   Ct Soft Tissue Neck W Contrast  06/23/2013   CLINICAL DATA:  Tonsil cancer.  Right neck lump.  EXAM: CT NECK WITH CONTRAST  TECHNIQUE: Multidetector CT imaging of the neck was performed using the standard protocol following the bolus administration of intravenous contrast.  CONTRAST:  112mL OMNIPAQUE IOHEXOL 300 MG/ML  SOLN  COMPARISON:  None.  FINDINGS: A solid mass lesion centered in the right palatine tonsil extends to the right tongue base measuring 4.3 x 2.9 x 4.1 cm. A right level 2 lymph node measures 3.2 x 1.0 cm. A more medial right level 2 lymph node is also worrisome for neoplasm, measuring 2.0 x 0.8 cm. No additional pathologic nodes are present on the right. No significant left-sided adenopathy is present.  No other focal mucosal or submucosal lesion is present. The vocal cords are midline and symmetric. The thyroid is unremarkable.  Bone windows demonstrate previous fusion at C5-6. Adjacent level osteophytes at C4-5 contribute to foraminal narrowing bilaterally, right greater than left. Uncovertebral disease is evident from C3-4 through C6-7. Vertebral body heights and alignment are maintained.  Wires are present along the inferior aspect of the right mandibular ramus. T mandibular teeth remain. The patient is otherwise edentulous.  IMPRESSION: 1. 4.3 x 2.9 x 4.1 cm right tonsil and peritonsillar mass is concerning for primary neoplasm. 2. Enlarged right level 2 lymph nodes are likely metastases. 3. Moderate spondylosis of the cervical spine with fusion at C5-6.   Electronically Signed   By: Lawrence Santiago M.D.   On: 06/23/2013 13:58   Nm Pet Image Initial (pi) Skull Base To Thigh  07/04/2013   CLINICAL DATA:  Initial treatment strategy for right  tonsil carcinoma.  EXAM: NUCLEAR MEDICINE PET SKULL BASE TO THIGH  TECHNIQUE: 17.9 mCi F-18 FDG was injected intravenously. Full-ring PET imaging was performed from the skull base to thigh after the radiotracer. CT data was obtained and used for attenuation correction and anatomic localization.  FASTING BLOOD GLUCOSE:  Value: 131 mg/dl  COMPARISON:  Head CT 06/23/2013  FINDINGS: NECK  There is a large hypermetabolic mass in the right tonsil location measuring 4.4 x 3.8 cm with intense metabolic activity (SUV max equal 18.3). There is hypermetabolic activity within the right lingual tonsil region (image 30) by with SUV max 8.3. There is mild parapharyngeal thickening on the CT portion.  There are  2 adjacent hypermetabolic right level 2 lymph nodes (image 39, fused series) with SUV max equal 5.2. These nodes are relatively small measuring 10 mm short axis. No hypermetabolic level 3 or level 4 lymph nodes.  CHEST  No hypermetabolic mediastinal or hilar nodes. No suspicious nodules.  ABDOMEN/PELVIS  No abnormal hypermetabolic activity within the liver, pancreas, adrenal glands, or spleen. No hypermetabolic lymph nodes in the abdomen or pelvis.  SKELETON  Several lucent lesions within the pelvis with diffuse sclerosis of the bone. There is no associated metabolic activity in these are felt to be benign chronic findings.  IMPRESSION: 1. Hypermetabolic right tonsillar mass consistent with primary carcinoma. 2. Hypermetabolic activity within the left tonsillar region without mass. Recommend clinical correlation for contralateral disease. 3. Hypermetabolic right level 2 lymph nodes consistent with local ipsilateral nodal metastasis. No evidence of contralateral nodal metastasis. 4. No evidence of metastasis within the chest, abdomen, or pelvis.   Electronically Signed   By: Suzy Bouchard M.D.   On: 07/04/2013 17:14    Microbiology: No results found for this or any previous visit (from the past 240 hour(s)).    Labs: Basic Metabolic Panel:  Recent Labs Lab 07/09/13 0443  NA 137  K 4.2  CL 98  CO2 28  GLUCOSE 138*  BUN 9  CREATININE 0.94  CALCIUM 9.2   Liver Function Tests: No results found for this basename: AST, ALT, ALKPHOS, BILITOT, PROT, ALBUMIN,  in the last 168 hours No results found for this basename: LIPASE, AMYLASE,  in the last 168 hours No results found for this basename: AMMONIA,  in the last 168 hours CBC:  Recent Labs Lab 07/09/13 0443  WBC 11.7*  HGB 11.7*  HCT 34.1*  MCV 82.6  PLT 334   Cardiac Enzymes: No results found for this basename: CKTOTAL, CKMB, CKMBINDEX, TROPONINI,  in the last 168 hours BNP: BNP (last 3 results) No results found for this basename: PROBNP,  in the last 8760 hours CBG:  Recent Labs Lab 07/04/13 1110  GLUCAP 131*       Signed:  Erline Hau  [facility] Pager: [phone] 07/09/2013, 2:11 PM

## 2013-07-12 ENCOUNTER — Telehealth: Payer: Self-pay | Admitting: *Deleted

## 2013-07-12 ENCOUNTER — Encounter (HOSPITAL_COMMUNITY): Payer: Self-pay | Admitting: Dentistry

## 2013-07-12 NOTE — Telephone Encounter (Signed)
Returned patient's call to clarify 1) Friday's appt for IV start and Simulation, and 2) treatment plan s/p last week's ENT Conference.  Gayleen Orem, RN, BSN, [facility] Head & Neck Oncology Navigator [phone]

## 2013-07-14 ENCOUNTER — Telehealth: Payer: Self-pay | Admitting: *Deleted

## 2013-07-14 ENCOUNTER — Ambulatory Visit (HOSPITAL_COMMUNITY): Payer: Medicare Other

## 2013-07-14 ENCOUNTER — Encounter: Payer: Self-pay | Admitting: *Deleted

## 2013-07-14 NOTE — Telephone Encounter (Signed)
Called patient to remind him of his CT [facility] tomorrow morning, with an arrival time of 8:00 and nothing to e/d 1 hr before scheduled 9:00 SIM.  He verbalized understanding.  Gayleen Orem, RN, BSN, [facility] Head & Neck Oncology Navigator [phone]

## 2013-07-14 NOTE — Progress Notes (Signed)
After talking with Sharyon Cable, [place] RN, completed paperwork for Melvin Hudson for the initiation of transportation via [facility] during his radiation therapy treatments. He will be simulated on 07/15/13 and can sign the necessary paperwork for this service.   Melvin Hudson does not have any family to assist in his care. Concerns regarding physical care and food preparation during his treatment phase .   He also would like a review of his current PET scan on simulation day.

## 2013-07-15 ENCOUNTER — Encounter: Payer: Self-pay | Admitting: *Deleted

## 2013-07-15 ENCOUNTER — Ambulatory Visit
Admission: RE | Admit: 2013-07-15 | Discharge: 2013-07-15 | Disposition: A | Discharge status: Home / Self Care | Payer: Medicare Other | Source: Ambulatory Visit | Attending: Radiation Oncology | Admitting: Radiation Oncology

## 2013-07-15 VITALS — BP 139/83 | HR 66 | Temp 98.3°F | Resp 16 | Wt 337.9 lb

## 2013-07-15 DIAGNOSIS — C099 Malignant neoplasm of tonsil, unspecified: Secondary | ICD-10-CM

## 2013-07-15 DIAGNOSIS — Z510 Encounter for antineoplastic radiation therapy: Secondary | ICD-10-CM | POA: Diagnosis not present

## 2013-07-15 MED ORDER — SODIUM CHLORIDE 0.9 % IJ SOLN
10.0000 mL | Freq: Once | INTRAMUSCULAR | Status: AC
  Administered 2013-07-15: 10 mL via INTRAVENOUS

## 2013-07-15 NOTE — Progress Notes (Signed)
Received patient in the clinic today to start IV needed for simulation. Vitals stable. Started left AC 22 gauge IV on the first attempt. Excellent blood return. Patient tolerated well. Secured IV in place. Escorted patient to CT for simulation. Encouraged patient to hydrate today. Advised patient of potential side effect of diarrhea related to effects of contrast. Patient verbalized understanding.

## 2013-07-15 NOTE — Progress Notes (Signed)
To provide support and encouragement, met with patient during CT SIM.  Afterwards, showed him [place] area, explained arrival and preparation procedure, treatment procedure.  He verbalized understanding.  Escorted him to [place] where Cecille Rubin finalized his [facility] application.  Continuing navigation as L1 patient (new patient).  Gayleen Orem, RN, BSN, [facility] Head & Neck Oncology Navigator [phone]

## 2013-07-15 NOTE — Progress Notes (Signed)
Right AC IV removed by Liliane Channel, RN. Catheter intact upon removal. Rick, RN applied an occlusive dressing to the old IV site. Patient tolerated well.

## 2013-07-15 NOTE — Progress Notes (Signed)
Called Mr. Jerami Tammen to inform him that his transportation request for [facility] has been approved.  They will pick him up for his appointments on 07/18/13, 07/20/13 and 6//8/15, but [facility] stated they could only schedule 3 dates in advance.  Faxed schedule to [facility] at [phone] for dates starting on July 18, 2013 thru September 13, 2013.

## 2013-07-15 NOTE — Progress Notes (Signed)
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 An Aquaplast head and shoulder mask was custom fitted to the patient's anatomy. High-resolution CT axial imaging was obtained of the head and neck with contrast. I verified that the quality of the imaging is good for treatment planning. 1 Medically Necessary Treatment Device was fabricated and supervised by me: Aquaplast mask.   Treatment planning note I plan to treat the patient with helical Tomotherapy, IMRT. I plan to treat the patient's tumor and bilateral neck nodes. I plan to treat to a total dose of 70 Gray in 35  fractions   IMRT planning Note  IMRT is an important modality to deliver adequate dose to the patient's at risk tissues while sparing the patient's normal structures, including the: esophagus, parotid tissue, mandible, brain stem, spinal cord, oral cavity, brachial plexus.  This justifies the use of IMRT in the patient's treatment.      -----------------------------------  Eppie Gibson, MD

## 2013-07-18 ENCOUNTER — Ambulatory Visit: Payer: Medicare Other | Admitting: Nutrition

## 2013-07-18 ENCOUNTER — Encounter: Payer: Self-pay | Admitting: Hematology and Oncology

## 2013-07-18 ENCOUNTER — Ambulatory Visit (HOSPITAL_BASED_OUTPATIENT_CLINIC_OR_DEPARTMENT_OTHER): Payer: Medicare Other | Admitting: Hematology and Oncology

## 2013-07-18 VITALS — BP 129/81 | HR 72 | Temp 98.2°F | Resp 18 | Ht 74.0 in | Wt 336.1 lb

## 2013-07-18 DIAGNOSIS — R634 Abnormal weight loss: Secondary | ICD-10-CM

## 2013-07-18 DIAGNOSIS — C099 Malignant neoplasm of tonsil, unspecified: Secondary | ICD-10-CM

## 2013-07-18 DIAGNOSIS — K1370 Unspecified lesions of oral mucosa: Secondary | ICD-10-CM

## 2013-07-18 DIAGNOSIS — K1379 Other lesions of oral mucosa: Secondary | ICD-10-CM

## 2013-07-18 NOTE — Progress Notes (Signed)
To provide support, continuing education and care continuity, met with patient during appt with Dr. Alvy Bimler.  She explained that at this time his treatment will consist of radiation only.  He verbalized understanding.  I escorted him to his nutritional appt.  Reminded him to call me with any questions or needs.  He verbalized understanding.  Continuing navigation as L1 patient (new patient).  Gayleen Orem, RN, BSN, [facility] Head & Neck Oncology Navigator [phone]

## 2013-07-18 NOTE — Progress Notes (Signed)
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 The HPV status is pending   06/23/2013 Imaging CT scan shwoed 4.3 x 2.9 x 4.1 cm right tonsil and peritonsillar mass and enlarged right level 2 lymph nodes are likely metastases    INTERVAL HISTORY: Please see below for problem oriented charting. He complains of mouth pain and difficulty swallowing food.  REVIEW OF SYSTEMS:   Constitutional: Denies fevers, chills  Eyes: Denies blurriness of vision Respiratory: Denies cough, dyspnea or wheezes Cardiovascular: Denies palpitation, chest discomfort or lower extremity swelling Gastrointestinal:  Denies nausea, heartburn or change in bowel habits Skin: Denies abnormal skin rashes Lymphatics: Denies new lymphadenopathy or easy bruising Neurological:Denies numbness, tingling or new weaknesses Behavioral/Psych: Mood is stable, no new changes  All other systems were reviewed with the patient and are negative.  I have reviewed the past medical history, past surgical history, social history and family history with the patient and they are unchanged from previous note.  ALLERGIES:  is allergic to advil and azilsartan.  MEDICATIONS:  Current Outpatient Prescriptions  Medication Sig Dispense Refill  . amLODipine (NORVASC) 10 MG tablet Take 10 mg by mouth every morning.       [name] aspirin EC 81 MG tablet Take 81 mg by mouth daily.        . Cholecalciferol (VITAMIN D-3 PO) Take 1,000 Units by mouth daily.      . diphenhydrAMINE (BENADRYL) 25 MG tablet Take 1 tablet (25 mg total) by mouth every 6 (six)  hours as needed for itching.  30 tablet  0  . metoprolol succinate (TOPROL-XL) 50 MG 24 hr tablet Take 50 mg by mouth every morning. Take with or immediately following a meal.      . oxyCODONE-acetaminophen (PERCOCET) 10-325 MG per tablet Take 1 tablet by mouth 3 (three) times daily as needed for pain.  30 tablet  0  . polyethylene glycol (MIRALAX / GLYCOLAX) packet Take 17 g by mouth daily.      [name] spironolactone (ALDACTONE) 25 MG tablet Take 25 mg by mouth every morning.       . tadalafil (CIALIS) 20 MG tablet Take 20 mg by mouth daily as needed for erectile dysfunction.        No current facility-administered medications for this visit.    PHYSICAL EXAMINATION: ECOG PERFORMANCE STATUS: 1 - Symptomatic but completely ambulatory  Filed Vitals:   07/18/13 1021  BP: 129/81  Pulse: 72  Temp: 98.2 F (36.8 C)  Resp: 18   Filed Weights   07/18/13 1021  Weight: 336 lb 1.6 oz (152.454 kg)    GENERAL:alert, no distress and comfortable he is morbidly obese SKIN: skin color, texture, turgor are normal, no rashes or significant lesions Musculoskeletal:no cyanosis of digits and no clubbing  NEURO: alert & oriented x 3 with fluent speech, no focal motor/sensory deficits  LABORATORY DATA:  I have reviewed the data as listed    Component Value Date/Time   NA 137 07/09/2013 0443   NA 141 06/30/2013 1438   K 4.2 07/09/2013 0443   K 4.0 06/30/2013 1438   CL 98 07/09/2013 0443  CO2 28 07/09/2013 0443   CO2 24 06/30/2013 1438   GLUCOSE 138* 07/09/2013 0443   GLUCOSE 119 06/30/2013 1438   BUN 9 07/09/2013 0443   BUN 10.1 06/30/2013 1438   CREATININE 0.94 07/09/2013 0443   CREATININE 0.8 06/30/2013 1438   CALCIUM 9.2 07/09/2013 0443   CALCIUM 9.7 06/30/2013 1438   PROT 7.3 06/30/2013 1438   ALBUMIN 3.9 06/30/2013 1438   AST 13 06/30/2013 1438   ALT 11 06/30/2013 1438   ALKPHOS 147 06/30/2013 1438   BILITOT 0.59 06/30/2013 1438   GFRNONAA 90* 07/09/2013 0443   GFRAA >90 07/09/2013 0443    No results  found for this basename: SPEP, UPEP,  kappa and lambda light chains    Lab Results  Component Value Date   WBC 11.7* 07/09/2013   NEUTROABS 6.8* 06/30/2013   HGB 11.7* 07/09/2013   HCT 34.1* 07/09/2013   MCV 82.6 07/09/2013   PLT 334 07/09/2013      Chemistry      Component Value Date/Time   NA 137 07/09/2013 0443   NA 141 06/30/2013 1438   K 4.2 07/09/2013 0443   K 4.0 06/30/2013 1438   CL 98 07/09/2013 0443   CO2 28 07/09/2013 0443   CO2 24 06/30/2013 1438   BUN 9 07/09/2013 0443   BUN 10.1 06/30/2013 1438   CREATININE 0.94 07/09/2013 0443   CREATININE 0.8 06/30/2013 1438      Component Value Date/Time   CALCIUM 9.2 07/09/2013 0443   CALCIUM 9.7 06/30/2013 1438   ALKPHOS 147 06/30/2013 1438   AST 13 06/30/2013 1438   ALT 11 06/30/2013 1438   BILITOT 0.59 06/30/2013 1438       RADIOGRAPHIC STUDIES: Review his most recent PET scan. I have personally reviewed the radiological images as listed and agreed with the findings in the report.   ASSESSMENT & PLAN:  Malignant neoplasm of tonsil I discussed with the patient the plan of care. Recently, we reviewed his case at the most recent tumor Board. I felt that the patient is a poor candidate for concurrent chemoradiation therapy. I recommend the patient to proceed with radiation therapy alone. I plan to provide systemic chemotherapy in the future if the patient did not respond to treatment or recur with metastatic disease.  Mouth pain Felt that the current pain medications controlling his pain. I would defer to his primary care provider to provide pain management in the future.  Weight loss Patient will meet with the dietitian after today's visit.  All questions were answered. The patient knows to call the clinic with any problems, questions or concerns. No barriers to learning was detected. I spent 25 minutes counseling the patient face to face. The total time spent in the appointment was 30 minutes and more than 50% was on counseling  and review of test results     Heath Lark, MD 07/18/2013 9:16 PM

## 2013-07-18 NOTE — Progress Notes (Signed)
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  Medications include: Norvasc, Vitamin D, benadryl, lopressor, percocet, miralax, aldactone, cialis.  Labs include: Glu: 138, WBC: 11.7, RBC: 4.13, Hgb: 11.7, Hct: 34.1  Height: 6'2'' Weight: 337 lbs Usual body weight: ~340 lbs BMI: 43.5  Estimated nutritional needs: 2700-3000 kcal/day and 170-180 g protein per day, ~2.7 Liters of fluid per day.  Pt is status post total mouth extraction. He reports having a good appetite, but that he has pain upon swallowing. Pt has experienced ~8 lb wt loss over the past 3-4 months. He reports that he eats mostly stews for breakfast, lunch and dinner per dietary recall. He says that he often skips breakfast or lunch.   Nutrition diagnosis: Unintended weight loss related to throat pain as evidenced by 8 lb wt loss.   Intervention: Pt educated on ways to increase calories and protein in diet. Used teach-back method. Pt was given handouts on soft and moist diet and ways to increase calories and protein. He was also provided with Boost and [employer] Essentials samples. Pt was advised to supplement diet with Boost or [employer] Essentials instead of skipping meals. Questions were answered.   Monitoring, evaluation, goals: Patient will minimize weight loss by consuming adequate calories and protein.   Follow-up: scheduling has been contacted

## 2013-07-18 NOTE — Assessment & Plan Note (Signed)
I discussed with the patient the plan of care. Recently, we reviewed his case at the most recent tumor Board. I felt that the patient is a poor candidate for concurrent chemoradiation therapy. I recommend the patient to proceed with radiation therapy alone. I plan to provide systemic chemotherapy in the future if the patient did not respond to treatment or recur with metastatic disease.

## 2013-07-18 NOTE — Assessment & Plan Note (Signed)
Felt that the current pain medications controlling his pain. I would defer to his primary care provider to provide pain management in the future.

## 2013-07-18 NOTE — Assessment & Plan Note (Signed)
Patient will meet with the dietitian after today's visit.

## 2013-07-20 ENCOUNTER — Encounter: Payer: Self-pay | Admitting: *Deleted

## 2013-07-20 ENCOUNTER — Encounter (HOSPITAL_COMMUNITY): Payer: Self-pay | Admitting: Dentistry

## 2013-07-20 ENCOUNTER — Ambulatory Visit (HOSPITAL_COMMUNITY): Payer: Medicaid - Dental | Admitting: Dentistry

## 2013-07-20 VITALS — BP 139/81 | HR 70 | Temp 98.6°F

## 2013-07-20 DIAGNOSIS — K000 Anodontia: Secondary | ICD-10-CM

## 2013-07-20 DIAGNOSIS — C099 Malignant neoplasm of tonsil, unspecified: Secondary | ICD-10-CM

## 2013-07-20 DIAGNOSIS — Z0189 Encounter for other specified special examinations: Secondary | ICD-10-CM

## 2013-07-20 DIAGNOSIS — K08109 Complete loss of teeth, unspecified cause, unspecified class: Secondary | ICD-10-CM

## 2013-07-20 DIAGNOSIS — M264 Malocclusion, unspecified: Secondary | ICD-10-CM

## 2013-07-20 DIAGNOSIS — K08199 Complete loss of teeth due to other specified cause, unspecified class: Secondary | ICD-10-CM

## 2013-07-20 NOTE — Patient Instructions (Signed)
PLAN: 1. Continue salt water rinses as needed to aid healing. 2. Brush tongue daily.  3. Perform trismus exercises as directed. 4. Advance diet as tolerated utilizing protein supplements per nutrition. 5. Return to clinic as scheduled for periodic oral examination during radiation therapy. 6. Dentures to be fabricated by the dentist of his choice 3 months after the date of the last radiation therapy   Lenn Cal, DDS

## 2013-07-20 NOTE — Progress Notes (Signed)
POST OPERATIVE NOTE:  07/20/2013 Melvin Hudson [id number]  VITALS: BP 139/81  Pulse 70  Temp(Src) 98.6 F (37 C) (Oral)  LABS:  Lab Results  Component Value Date   WBC 11.7* 07/09/2013   HGB 11.7* 07/09/2013   HCT 34.1* 07/09/2013   MCV 82.6 07/09/2013   PLT 334 07/09/2013   BMET    Component Value Date/Time   NA 137 07/09/2013 0443   NA 141 06/30/2013 1438   K 4.2 07/09/2013 0443   K 4.0 06/30/2013 1438   CL 98 07/09/2013 0443   CO2 28 07/09/2013 0443   CO2 24 06/30/2013 1438   GLUCOSE 138* 07/09/2013 0443   GLUCOSE 119 06/30/2013 1438   BUN 9 07/09/2013 0443   BUN 10.1 06/30/2013 1438   CREATININE 0.94 07/09/2013 0443   CREATININE 0.8 06/30/2013 1438   CALCIUM 9.2 07/09/2013 0443   CALCIUM 9.7 06/30/2013 1438   GFRNONAA 90* 07/09/2013 0443   GFRAA >90 07/09/2013 0443    No results found for this basename: INR, PROTIME   No results found for this basename: PTT     Melvin Hudson is status post extraction of remaining lower teeth with alveoloplasty and pre-prosthetic surgery as indicated on 07/08/2013. Patient now presents for evaluation of healing and suture removal.  SUBJECTIVE: Patient denies having any problems with significant oral discomfort. Patient indicates that several stitches still remain.   EXAM: There is no sign of infection, heme, or ooze. Sutures are loosely intact. Generalized primary closure is noted.  PROCEDURE: The patient was given a chlorhexidine gluconate rinse for 30 seconds. Sutures were then removed without complication. Patient tolerated the procedure well.  ASSESSMENT: Post operative course is consistent with dental procedures performed in the operating room. The patient is now edentulous. Lower denture cannot be fabricated until 3 months after the date of the last radiation therapy. The patient is cleared for radiation therapy starting 07/25/2013.  PLAN: 1. Continue salt water rinses as needed to aid healing. 2. Brush tongue daily.  3.  Perform trismus exercises as directed. 4. Advance diet as tolerated utilizing protein supplements per nutrition.  5. Return to clinic as scheduled for periodic oral examination during radiation therapy. 6. Dentures to be fabricated by the dentist of his choice 3 months after the date of the last radiation therapy   Lenn Cal, DDS

## 2013-07-21 ENCOUNTER — Encounter: Payer: Self-pay | Admitting: *Deleted

## 2013-07-21 NOTE — Progress Notes (Signed)
[place] Psychosocial Distress Screening Clinical Social Work  Clinical Social Work was referred by distress screening protocol.  The patient scored a 7 on the Psychosocial Distress Thermometer which indicates moderate distress. Clinical Administrator, Civil Service with patient from previous consult.  CSW followed up with patient in patient & family support center to assess for distress and other psychosocial needs.  Patient signed up for SCAT services.  No other needs at this time.  ONCBCN DISTRESS SCREENING 07/18/2013  Screening Type   Elta [place] the number that describes how much distress you have been experiencing in the past week 7  Physical Problem type Pain;Sexual problems  Physician notified of physical symptoms Yes  Referral to clinical psychology No  Referral to clinical social work Yes    Clinical Social Worker follow up needed: yes  If yes, follow up plan:  Completed Medicaid Personal Care services form. Will follow patient throughout cancer journey as needed.   Polo Riley, MSW, LCSW, OSW-C Clinical Social Worker [facility] [phone]

## 2013-07-25 ENCOUNTER — Telehealth: Payer: Self-pay

## 2013-07-25 ENCOUNTER — Ambulatory Visit: Payer: Medicare Other

## 2013-07-25 ENCOUNTER — Ambulatory Visit: Payer: PRIVATE HEALTH INSURANCE | Attending: Radiation Oncology

## 2013-07-25 DIAGNOSIS — Z510 Encounter for antineoplastic radiation therapy: Secondary | ICD-10-CM | POA: Diagnosis not present

## 2013-07-25 DIAGNOSIS — R1310 Dysphagia, unspecified: Secondary | ICD-10-CM | POA: Insufficient documentation

## 2013-07-25 DIAGNOSIS — IMO0001 Reserved for inherently not codable concepts without codable children: Secondary | ICD-10-CM | POA: Insufficient documentation

## 2013-07-25 NOTE — Telephone Encounter (Signed)
[place] with [place][phone] to schedule scat  transportation for June 9-June 12 daily radiation appointments.Patient has no reservation for today with Garald Balding so Romie Jumper will reschedule.Left voice message for patient to return my call.Pick up will be between 11:00 am and 11:30 for 12;00 pm appointment and pick up 12:00 to 12:30 for 1:00 pm appointment.

## 2013-07-25 NOTE — Telephone Encounter (Signed)
Patient returned my call and states he does have transportation today for appointment with Melvin Hudson.He will stop by radiation today or tomorrow to discuss transportation appointments.

## 2013-07-26 ENCOUNTER — Ambulatory Visit
Admission: RE | Admit: 2013-07-26 | Discharge: 2013-07-26 | Disposition: A | Discharge status: Home / Self Care | Payer: Medicare Other | Source: Ambulatory Visit | Attending: Radiation Oncology | Admitting: Radiation Oncology

## 2013-07-26 ENCOUNTER — Encounter: Payer: Self-pay | Admitting: *Deleted

## 2013-07-26 DIAGNOSIS — C099 Malignant neoplasm of tonsil, unspecified: Secondary | ICD-10-CM

## 2013-07-26 DIAGNOSIS — Z510 Encounter for antineoplastic radiation therapy: Secondary | ICD-10-CM | POA: Diagnosis not present

## 2013-07-26 NOTE — Progress Notes (Signed)
IMRT Device Note  Tonsil Cancer  Outpatient 7.8  delivered field widths represent one set of IMRT treatment devices. The code is [phone].  -----------------------------------  Eppie Gibson, MD

## 2013-07-27 ENCOUNTER — Ambulatory Visit
Admission: RE | Admit: 2013-07-27 | Discharge: 2013-07-27 | Disposition: A | Discharge status: Home / Self Care | Payer: Medicare Other | Source: Ambulatory Visit | Attending: Radiation Oncology | Admitting: Radiation Oncology

## 2013-07-27 ENCOUNTER — Other Ambulatory Visit: Payer: Self-pay | Admitting: Radiation Oncology

## 2013-07-27 DIAGNOSIS — Z510 Encounter for antineoplastic radiation therapy: Secondary | ICD-10-CM | POA: Diagnosis not present

## 2013-07-27 MED ORDER — LORAZEPAM 1 MG PO TABS: 1.0000 mg | ORAL_TABLET | Freq: Three times a day (TID) | ORAL | Status: DC

## 2013-07-28 ENCOUNTER — Ambulatory Visit
Admission: RE | Admit: 2013-07-28 | Discharge: 2013-07-28 | Disposition: A | Discharge status: Home / Self Care | Payer: Medicare Other | Source: Ambulatory Visit | Attending: Radiation Oncology | Admitting: Radiation Oncology

## 2013-07-28 DIAGNOSIS — Z510 Encounter for antineoplastic radiation therapy: Secondary | ICD-10-CM | POA: Diagnosis not present

## 2013-07-29 ENCOUNTER — Ambulatory Visit
Admission: RE | Admit: 2013-07-29 | Discharge: 2013-07-29 | Disposition: A | Discharge status: Home / Self Care | Payer: Medicare Other | Source: Ambulatory Visit | Attending: Radiation Oncology | Admitting: Radiation Oncology

## 2013-07-29 ENCOUNTER — Telehealth: Payer: Self-pay | Admitting: Hematology and Oncology

## 2013-07-29 ENCOUNTER — Encounter: Payer: Self-pay | Admitting: Radiation Oncology

## 2013-07-29 DIAGNOSIS — Z510 Encounter for antineoplastic radiation therapy: Secondary | ICD-10-CM | POA: Diagnosis not present

## 2013-07-29 DIAGNOSIS — C099 Malignant neoplasm of tonsil, unspecified: Secondary | ICD-10-CM

## 2013-07-29 MED ORDER — BIAFINE EX EMUL
Freq: Two times a day (BID) | CUTANEOUS | Status: DC
  Administered 2013-07-29: 14:00:00 via TOPICAL

## 2013-07-29 NOTE — Telephone Encounter (Signed)
s/w pt re nutr appt for 6/23

## 2013-07-29 NOTE — Progress Notes (Signed)
Patient goes to [facility] in [place] [phone]) and has been seen by 2 different MDs - Mirna Mires and [place]. Receptionist indicated she can direct my call to either of them.

## 2013-07-29 NOTE — Progress Notes (Signed)
Patient education w/patient. Gave pt "Radiation and You" booklet w/all pertinent information marked and discussed, re: fatigue, hair loss/care, mouth changes/management, nausea/management, skin irritation/care, throat irritation/management, nutrition, pain. Gave pt Biafine with verbal and written instructions for proper use. Pt has seen nutritionist. Pt verbalized understanding; information will be reinforced throughout treatments. Pt informed he will see Dr Isidore Moos every [place] as well as Malachy Mood, RN.

## 2013-08-01 ENCOUNTER — Ambulatory Visit
Admission: RE | Admit: 2013-08-01 | Discharge: 2013-08-01 | Disposition: A | Discharge status: Home / Self Care | Payer: Medicare Other | Source: Ambulatory Visit | Attending: Radiation Oncology | Admitting: Radiation Oncology

## 2013-08-01 VITALS — BP 109/77 | HR 84 | Temp 98.7°F | Resp 20 | Ht 74.0 in | Wt 323.6 lb

## 2013-08-01 DIAGNOSIS — C099 Malignant neoplasm of tonsil, unspecified: Secondary | ICD-10-CM

## 2013-08-01 DIAGNOSIS — Z510 Encounter for antineoplastic radiation therapy: Secondary | ICD-10-CM | POA: Diagnosis not present

## 2013-08-01 MED ORDER — FENTANYL 25 MCG/HR TD PT72: 25.0000 ug | MEDICATED_PATCH | TRANSDERMAL | Status: DC

## 2013-08-01 MED ORDER — SENNA 8.6 MG PO TABS: 1.0000 | ORAL_TABLET | Freq: Every day | ORAL | Status: DC

## 2013-08-01 MED ORDER — FLUCONAZOLE 100 MG PO TABS: ORAL_TABLET | ORAL | Status: DC

## 2013-08-01 NOTE — Progress Notes (Signed)
Melvin Hudson has had 5 fractions to his tonsil/bilateral neck.  He reports pain in his right upper neck underneath his jaw at a 9/10.  He reports taking oxycodone/acetaminophen 10/325 mg q 4 hours.  He says the pain is waking up at night.  He reports that he is having trouble swallowing due to the pain.  He is eating soft foods like spaghetti and [place].  He has been drinking V8 with protein and is going to start drinking boost.  He has lost 13 lbs since 6/1.  He reports feeling dizzy when standing.  Orthostatic vitals done: bp sitting 126/84, hr 68, bp standing 109/77, hr 84.  He is taking metoprolol, amlodipine and spironolactone.  He reports a dry mouth especially during treatment.  His tongue has a heavy whitish/brown coating on it.  He reports fatigue.

## 2013-08-01 NOTE — Progress Notes (Signed)
Weekly Management Note:  outpatient Current Dose:  10 Gy  Projected Dose: 70 Gy   Narrative:  The patient presents for routine under treatment assessment.  CBCT/MVCT images/Port film x-rays were reviewed.  The chart was checked. He reports pain in his right upper neck underneath his jaw at a 9/10. He reports taking oxycodone/acetaminophen 10/325 mg q 4 hours. He says the pain is waking up at night. He reports that he is having trouble swallowing due to the pain. He is eating soft foods like spaghetti and [place]. He has been drinking V8 with protein and is going to start drinking boost. He has lost 13 lbs since 6/1. He reports feeling dizzy when standing. Orthostatic vitals done: bp sitting 126/84, hr 68, bp standing 109/77, hr 84. He is taking metoprolol, amlodipine and spironolactone. He reports a dry mouth especially during treatment. His tongue has a heavy whitish/brown coating on it. He reports fatigue   Physical Findings:  height is 6\' 2"  (1.88 m) and weight is 323 lb 9.6 oz (146.784 kg). His oral temperature is 98.7 F (37.1 C). His blood pressure is 109/77 and his pulse is 84. His respiration is 20 and oxygen saturation is 97%.  Thrush throughout oropharynx/mouth.  Large right tonsil tumor.  Vitals with Age-Percentiles 08/01/2013 08/01/2013  Length  151 cm  Systolic 761 607  Diastolic 77 84  Pulse 84 68  Respiration  20  Weight  146.784 kg  BMI  41.6  VISIT REPORT     CBC    Component Value Date/Time   WBC 11.7* 07/09/2013 0443   WBC 10.2 06/30/2013 1437   RBC 4.13* 07/09/2013 0443   RBC 4.26 06/30/2013 1437   HGB 11.7* 07/09/2013 0443   HGB 12.3* 06/30/2013 1437   HCT 34.1* 07/09/2013 0443   HCT 35.4* 06/30/2013 1437   PLT 334 07/09/2013 0443   PLT 334 06/30/2013 1437   MCV 82.6 07/09/2013 0443   MCV 83.1 06/30/2013 1437   MCH 28.3 07/09/2013 0443   MCH 28.9 06/30/2013 1437   MCHC 34.3 07/09/2013 0443   MCHC 34.8 06/30/2013 1437   RDW 16.8* 07/09/2013 0443   RDW 16.8* 06/30/2013 1437     LYMPHSABS 2.3 06/30/2013 1437   LYMPHSABS 2.0 09/17/2011 1242   MONOABS 0.8 06/30/2013 1437   MONOABS 0.5 09/17/2011 1242   EOSABS 0.4 06/30/2013 1437   EOSABS 0.0 09/17/2011 1242   BASOSABS 0.0 06/30/2013 1437   BASOSABS 0.0 09/17/2011 1242     CMP     Component Value Date/Time   NA 137 07/09/2013 0443   NA 141 06/30/2013 1438   K 4.2 07/09/2013 0443   K 4.0 06/30/2013 1438   CL 98 07/09/2013 0443   CO2 28 07/09/2013 0443   CO2 24 06/30/2013 1438   GLUCOSE 138* 07/09/2013 0443   GLUCOSE 119 06/30/2013 1438   BUN 9 07/09/2013 0443   BUN 10.1 06/30/2013 1438   CREATININE 0.94 07/09/2013 0443   CREATININE 0.8 06/30/2013 1438   CALCIUM 9.2 07/09/2013 0443   CALCIUM 9.7 06/30/2013 1438   PROT 7.3 06/30/2013 1438   ALBUMIN 3.9 06/30/2013 1438   AST 13 06/30/2013 1438   ALT 11 06/30/2013 1438   ALKPHOS 147 06/30/2013 1438   BILITOT 0.59 06/30/2013 1438   GFRNONAA 90* 07/09/2013 0443   GFRAA >90 07/09/2013 0443     Impression:  The patient is tolerating radiotherapy.   Plan:  Continue radiotherapy as planned.   Will Call PCP to evaluate  if any BP meds may be held safely. Dr Jeanie Cooks [phone].  Left message with pain clinic to discuss pain med management. [phone] [place] and [place].  Started Duragesic 25 mcg today.  Fluconazole Rx'd for thrush.  Senna Rx'd to prevent constipation on pain meds.   BMP ordered to check renal function and electrolytes.  Will ask nutritionist to see him again, soon, for wt loss.  Asked Anson Fret RN, patient navigator, to follow patient closely in light of multiple social stressors and complex issues. -----------------------------------  Eppie Gibson, MD

## 2013-08-02 ENCOUNTER — Telehealth: Payer: Self-pay | Admitting: *Deleted

## 2013-08-02 ENCOUNTER — Ambulatory Visit
Admission: RE | Admit: 2013-08-02 | Discharge: 2013-08-02 | Disposition: A | Discharge status: Home / Self Care | Payer: Medicare Other | Source: Ambulatory Visit | Attending: Radiation Oncology | Admitting: Radiation Oncology

## 2013-08-02 ENCOUNTER — Encounter: Payer: Self-pay | Admitting: *Deleted

## 2013-08-02 DIAGNOSIS — C099 Malignant neoplasm of tonsil, unspecified: Secondary | ICD-10-CM

## 2013-08-02 DIAGNOSIS — Z510 Encounter for antineoplastic radiation therapy: Secondary | ICD-10-CM | POA: Diagnosis not present

## 2013-08-02 LAB — BASIC METABOLIC PANEL (CC13)
Anion Gap: 9 mEq/L (ref 3–11)
BUN: 9.5 mg/dL (ref 7.0–26.0)
CO2: 27 mEq/L (ref 22–29)
CREATININE: 0.9 mg/dL (ref 0.7–1.3)
Calcium: 9.6 mg/dL (ref 8.4–10.4)
Chloride: 103 mEq/L (ref 98–109)
Glucose: 121 mg/dl (ref 70–140)
POTASSIUM: 4 meq/L (ref 3.5–5.1)
Sodium: 139 mEq/L (ref 136–145)

## 2013-08-02 NOTE — Progress Notes (Signed)
Met with patient after RT to provide support and encouragement.  Relayed Dr. Pearlie Oyster guidance per her discussion with Dr. Jeanie Cooks to stop taking amlodipine d/t lowering BP and dehydration.  He verbalized understanding.  l1  Gayleen Orem, RN, BSN, [facility] Oncology Navigator [phone]

## 2013-08-02 NOTE — Telephone Encounter (Signed)
Called patient to inform of appt. For lab today @ 1:30 pm, spoke with patient and she is aware of this.

## 2013-08-03 ENCOUNTER — Ambulatory Visit
Admission: RE | Admit: 2013-08-03 | Discharge: 2013-08-03 | Disposition: A | Discharge status: Home / Self Care | Payer: Medicare Other | Source: Ambulatory Visit | Attending: Radiation Oncology | Admitting: Radiation Oncology

## 2013-08-03 DIAGNOSIS — Z510 Encounter for antineoplastic radiation therapy: Secondary | ICD-10-CM | POA: Diagnosis not present

## 2013-08-04 ENCOUNTER — Ambulatory Visit: Payer: Medicare Other

## 2013-08-05 ENCOUNTER — Ambulatory Visit
Admission: RE | Admit: 2013-08-05 | Discharge: 2013-08-05 | Disposition: A | Discharge status: Home / Self Care | Payer: Medicare Other | Source: Ambulatory Visit | Attending: Radiation Oncology | Admitting: Radiation Oncology

## 2013-08-05 ENCOUNTER — Other Ambulatory Visit: Payer: Self-pay | Admitting: Radiation Oncology

## 2013-08-05 DIAGNOSIS — Z510 Encounter for antineoplastic radiation therapy: Secondary | ICD-10-CM | POA: Diagnosis not present

## 2013-08-05 NOTE — Progress Notes (Signed)
To provide support and encouragement, met with patient during [facility].  Reviewed treatment procedure, arrival and preparation for future appts.  He verbalized understanding.  Continuing navigation as L1 patient (new patient).  Gayleen Orem, RN, BSN, [facility] Head & Neck Oncology Navigator [phone]

## 2013-08-08 ENCOUNTER — Ambulatory Visit
Admission: RE | Admit: 2013-08-08 | Discharge: 2013-08-08 | Disposition: A | Discharge status: Home / Self Care | Payer: Medicare Other | Source: Ambulatory Visit | Attending: Radiation Oncology | Admitting: Radiation Oncology

## 2013-08-08 ENCOUNTER — Encounter: Payer: Self-pay | Admitting: *Deleted

## 2013-08-08 VITALS — BP 127/79 | HR 72 | Temp 98.8°F | Wt 313.9 lb

## 2013-08-08 DIAGNOSIS — Z510 Encounter for antineoplastic radiation therapy: Secondary | ICD-10-CM | POA: Diagnosis not present

## 2013-08-08 DIAGNOSIS — C099 Malignant neoplasm of tonsil, unspecified: Secondary | ICD-10-CM

## 2013-08-08 MED ORDER — MAGIC MOUTHWASH W/LIDOCAINE: ORAL | Status: DC

## 2013-08-08 NOTE — Progress Notes (Signed)
Weekly assessment of radiation to bilateral neck for tonsillar cancer.Completed 9 of 35 treatments.Mild throat discomfor secondary to thrush which is clearing up from diflucan.Lost 1 lbs in last week for diminished appetite.Melvin Hudson did give some ensure today.States he is alternating ensure, boost and milk as well as beans and [place] broth.Feeling tired.Denies dizziness.

## 2013-08-08 NOTE — Progress Notes (Signed)
   Weekly Management Note:  outpatient Current Dose:  18 Gy  Projected Dose: 70 Gy   Narrative:  The patient presents for routine under treatment assessment.  CBCT/MVCT images/Port film x-rays were reviewed.  The chart was checked. Mild throat discomfort secondary to thrush which is clearing up from diflucan.Lost 10 lbs in last week for diminished appetite. Dory Peru did give some ensure today.States he is alternating ensure, boost and milk as well as beans and [place] broth.Feeling tired.Denies dizziness. Holding amlodipine per PCP's recommendations.  Still has significant pain in right tonsil area with R otalgia.   Physical Findings:  weight is 313 lb 14.4 oz (142.384 kg). His temperature is 98.8 F (37.1 C). His blood pressure is 127/79 and his pulse is 72. His oxygen saturation is 100%.  no thrush, right tonsil mass appears to be regressing. Thick sputum. Palpable subtle right neck adenopathy. CBC    Component Value Date/Time   WBC 11.7* 07/09/2013 0443   WBC 10.2 06/30/2013 1437   RBC 4.13* 07/09/2013 0443   RBC 4.26 06/30/2013 1437   HGB 11.7* 07/09/2013 0443   HGB 12.3* 06/30/2013 1437   HCT 34.1* 07/09/2013 0443   HCT 35.4* 06/30/2013 1437   PLT 334 07/09/2013 0443   PLT 334 06/30/2013 1437   MCV 82.6 07/09/2013 0443   MCV 83.1 06/30/2013 1437   MCH 28.3 07/09/2013 0443   MCH 28.9 06/30/2013 1437   MCHC 34.3 07/09/2013 0443   MCHC 34.8 06/30/2013 1437   RDW 16.8* 07/09/2013 0443   RDW 16.8* 06/30/2013 1437   LYMPHSABS 2.3 06/30/2013 1437   LYMPHSABS 2.0 09/17/2011 1242   MONOABS 0.8 06/30/2013 1437   MONOABS 0.5 09/17/2011 1242   EOSABS 0.4 06/30/2013 1437   EOSABS 0.0 09/17/2011 1242   BASOSABS 0.0 06/30/2013 1437   BASOSABS 0.0 09/17/2011 1242     CMP     Component Value Date/Time   NA 139 08/02/2013 1355   NA 137 07/09/2013 0443   K 4.0 08/02/2013 1355   K 4.2 07/09/2013 0443   CL 98 07/09/2013 0443   CO2 27 08/02/2013 1355   CO2 28 07/09/2013 0443   GLUCOSE 121 08/02/2013 1355   GLUCOSE  138* 07/09/2013 0443   BUN 9.5 08/02/2013 1355   BUN 9 07/09/2013 0443   CREATININE 0.9 08/02/2013 1355   CREATININE 0.94 07/09/2013 0443   CALCIUM 9.6 08/02/2013 1355   CALCIUM 9.2 07/09/2013 0443   PROT 7.3 06/30/2013 1438   ALBUMIN 3.9 06/30/2013 1438   AST 13 06/30/2013 1438   ALT 11 06/30/2013 1438   ALKPHOS 147 06/30/2013 1438   BILITOT 0.59 06/30/2013 1438   GFRNONAA 90* 07/09/2013 0443   GFRAA >90 07/09/2013 0443     Impression:  The patient is tolerating radiotherapy.   Plan:  Continue radiotherapy as planned.  Rx for magic MW with lidocaine given. Recipe for salt/baking soda gargles given for mucositis and sputum.  Saw nutrition today - will need close follow-up and reeducation due to weight loss. -----------------------------------  Eppie Gibson, MD

## 2013-08-08 NOTE — Progress Notes (Signed)
Confirmed with Dr. Isidore Moos that prescription for Magic Mouthwash is 1 part Viscous Lidocaine (240 ml) and 1 part water (240 ml).  Called [facility] to inform them that the prescription is correct for the aforementioned ingredients and they replied that it would be filled today.  Melvin Hudson called and informed that his medication should be ready within the next 30 minutes to 1 hour, but to please call first to confirm.

## 2013-08-09 ENCOUNTER — Ambulatory Visit (HOSPITAL_COMMUNITY): Payer: Medicaid - Dental | Admitting: Dentistry

## 2013-08-09 ENCOUNTER — Ambulatory Visit
Admission: RE | Admit: 2013-08-09 | Discharge: 2013-08-09 | Disposition: A | Discharge status: Home / Self Care | Payer: Medicare Other | Source: Ambulatory Visit | Attending: Radiation Oncology | Admitting: Radiation Oncology

## 2013-08-09 ENCOUNTER — Encounter (HOSPITAL_COMMUNITY): Payer: Self-pay | Admitting: Dentistry

## 2013-08-09 ENCOUNTER — Ambulatory Visit: Payer: Medicare Other | Admitting: Nutrition

## 2013-08-09 VITALS — BP 133/88 | HR 75 | Temp 98.4°F | Wt 313.0 lb

## 2013-08-09 DIAGNOSIS — Z510 Encounter for antineoplastic radiation therapy: Secondary | ICD-10-CM | POA: Diagnosis not present

## 2013-08-09 DIAGNOSIS — K08109 Complete loss of teeth, unspecified cause, unspecified class: Secondary | ICD-10-CM

## 2013-08-09 DIAGNOSIS — K117 Disturbances of salivary secretion: Secondary | ICD-10-CM

## 2013-08-09 DIAGNOSIS — Z0189 Encounter for other specified special examinations: Secondary | ICD-10-CM

## 2013-08-09 DIAGNOSIS — R432 Parageusia: Secondary | ICD-10-CM

## 2013-08-09 DIAGNOSIS — R1310 Dysphagia, unspecified: Secondary | ICD-10-CM

## 2013-08-09 DIAGNOSIS — K000 Anodontia: Secondary | ICD-10-CM

## 2013-08-09 DIAGNOSIS — K1233 Oral mucositis (ulcerative) due to radiation: Secondary | ICD-10-CM

## 2013-08-09 DIAGNOSIS — C099 Malignant neoplasm of tonsil, unspecified: Secondary | ICD-10-CM

## 2013-08-09 DIAGNOSIS — R682 Dry mouth, unspecified: Secondary | ICD-10-CM

## 2013-08-09 DIAGNOSIS — R439 Unspecified disturbances of smell and taste: Secondary | ICD-10-CM

## 2013-08-09 NOTE — Progress Notes (Signed)
08/09/2013  Patient:            Melvin Hudson Date of Birth:  27-Oct-1955 MRN:                [id number]  BP 133/88  Pulse 75  Temp(Src) 98.4 F (36.9 C) (Oral)  Wt 313 lb (141.976 kg)  [name] presents for limited oral examination during radiation therapy. Patient has completed 10/35 Treatments. Last treatment is 09/14/13 by report.  REVIEW OF CHIEF COMPLAINTS:  DRY MOUTH: Yes. HARD TO SWALLOW: Yes  HURT TO SWALLOW: Yes TASTE CHANGES: Patient still has some taste remaining. SORES IN MOUTH: Yes TRISMUS: No problems with trismus symptoms. WEIGHT: 313 pounds down from previous 341 pounds.  HOME OH REGIMEN:  BRUSHING: Edentulous. Patient reminded to brush tongue daily. FLOSSING: Not applicable RINSING: Rinsing with salt water and baking soda rinses as well as Biotene rinses. FLUORIDE: Not applicable TRISMUS EXERCISES:  Maximum interincisal opening: 42 mm. Trismus device fabricated with 25 sticks.   DENTAL EXAM:  Oral Hygiene:(PLAQUE): Patient is edentulous. LOCATION OF MUCOSITIS: Right tonsillar area DESCRIPTION OF SALIVA: Decreased saliva. Ropey saliva. ANY EXPOSED BONE: None noted OTHER WATCHED AREAS: Previous extraction sites. No obvious oral candidiasis noted today. DX: Xerostomia, Dysgeusia, Dysphagia, Odynophagia, Weight Loss, Mucositis and edentulous  RECOMMENDATIONS: 1. Rinse mouth after meals and at bedtime.  Brush tongue daily.  2. Use trismus exercises as directed. 3. Use Biotene Rinse or salt water/baking soda rinses. 4. Multiple sips of water as needed. 5. Return to clinic in two months for oral exam after radiation therapy. Call if problems before then.  Lenn Cal, DDS

## 2013-08-09 NOTE — Progress Notes (Signed)
Attempted nutrition followup with patient.  Patient concerned that his ride will leave without him so he was unwilling to stay for nutrition followup.  I did walk with patient in the lobby and attempted to educate him on the importance of smaller, more frequent meals utilizing high protein foods.  Patient was on the phone with his "ride" during conversation.  I recommended patient drink 4 Ensure Plus a day.  Provided patient with one complementary case of Ensure Plus.  Labs reviewed.  Revised Estimated nutrition needs: 2500-2800 calories, 145-160 g protein, 2.6 L fluid.  Nutrition diagnosis: Unintended weight loss continues.  Intervention:  Educated on small, frequent meals utilizing high-calorie, high-protein foods. Ensure Plus 4 times a day. (1400 calories, 52 grams protein) Fact sheets and contact information given. Teach back method used.  Monitoring, evaluation, goals: Patient will work to increase calories and protein along with oral nutrition supplements to promote weight stabilization.  Next visit: Will attempt followup with patient during treatment.

## 2013-08-09 NOTE — Patient Instructions (Signed)
RECOMMENDATIONS: 1. Rinse mouth after meals and at bedtime.  Brush tongue daily.  2. Use trismus exercises as directed. 3. Use Biotene Rinse or salt water/baking soda rinses. 4. Multiple sips of water as needed. 5. Return to clinic in two months for oral exam after radiation therapy. Call if problems before then.  Lenn Cal, DDS

## 2013-08-10 ENCOUNTER — Ambulatory Visit
Admission: RE | Admit: 2013-08-10 | Discharge: 2013-08-10 | Disposition: A | Discharge status: Home / Self Care | Payer: Medicare Other | Source: Ambulatory Visit | Attending: Radiation Oncology | Admitting: Radiation Oncology

## 2013-08-10 ENCOUNTER — Encounter: Payer: Self-pay | Admitting: *Deleted

## 2013-08-10 DIAGNOSIS — Z510 Encounter for antineoplastic radiation therapy: Secondary | ICD-10-CM | POA: Diagnosis not present

## 2013-08-10 NOTE — Progress Notes (Signed)
Met with patient prior to scheduled RT to provide support, encouragement and assess for needs.  Patient verbalized frustration with SCAT and his inability to keep full appt with Dory Peru, Nutritionist, yesterday.  I assured him Raford Pitcher will follow-up with him.  Otherwise, he did not express and needs or concerns.  Gayleen Orem, RN, BSN, [facility] Head & Neck Oncology Navigator [phone]

## 2013-08-11 ENCOUNTER — Ambulatory Visit
Admission: RE | Admit: 2013-08-11 | Discharge: 2013-08-11 | Disposition: A | Discharge status: Home / Self Care | Payer: Medicare Other | Source: Ambulatory Visit | Attending: Radiation Oncology | Admitting: Radiation Oncology

## 2013-08-11 DIAGNOSIS — Z510 Encounter for antineoplastic radiation therapy: Secondary | ICD-10-CM | POA: Diagnosis not present

## 2013-08-12 ENCOUNTER — Encounter: Payer: Self-pay | Admitting: *Deleted

## 2013-08-12 ENCOUNTER — Ambulatory Visit
Admission: RE | Admit: 2013-08-12 | Discharge: 2013-08-12 | Disposition: A | Discharge status: Home / Self Care | Payer: Medicare Other | Source: Ambulatory Visit | Attending: Radiation Oncology | Admitting: Radiation Oncology

## 2013-08-12 DIAGNOSIS — Z510 Encounter for antineoplastic radiation therapy: Secondary | ICD-10-CM | POA: Diagnosis not present

## 2013-08-12 NOTE — Progress Notes (Signed)
Met with patient after his daily RT.  He denied any needs/concerns.  He reported that his sister and b-in-law are coming down from [place] this weekend and are staying through next weekend.  He reported that the "assistant" is coming daily, helping with morning meal preparation and cleaning.  He stated this has been very helpful.  He stated his lady friend "in [place]" calls regularly to check on him.  He asked me to fax SCAT vouchers for Wed, Thurs and today which I did.   Continuing navigation as L2 patient (treatments established).  Gayleen Orem, RN, BSN, [facility] Head & Neck Oncology Navigator [phone]

## 2013-08-15 ENCOUNTER — Ambulatory Visit
Admission: RE | Admit: 2013-08-15 | Discharge: 2013-08-15 | Disposition: A | Discharge status: Home / Self Care | Payer: Medicare Other | Source: Ambulatory Visit | Attending: Radiation Oncology | Admitting: Radiation Oncology

## 2013-08-15 ENCOUNTER — Encounter: Payer: Self-pay | Admitting: *Deleted

## 2013-08-15 ENCOUNTER — Encounter: Payer: Self-pay | Admitting: Radiation Oncology

## 2013-08-15 VITALS — BP 116/73 | HR 102 | Temp 98.2°F | Ht 74.0 in | Wt 312.0 lb

## 2013-08-15 DIAGNOSIS — C099 Malignant neoplasm of tonsil, unspecified: Secondary | ICD-10-CM

## 2013-08-15 DIAGNOSIS — Z510 Encounter for antineoplastic radiation therapy: Secondary | ICD-10-CM | POA: Diagnosis not present

## 2013-08-15 LAB — BASIC METABOLIC PANEL (CC13)
ANION GAP: 9 meq/L (ref 3–11)
BUN: 14.3 mg/dL (ref 7.0–26.0)
CHLORIDE: 101 meq/L (ref 98–109)
CO2: 29 mEq/L (ref 22–29)
Calcium: 9.8 mg/dL (ref 8.4–10.4)
Creatinine: 1.2 mg/dL (ref 0.7–1.3)
Glucose: 155 mg/dl — ABNORMAL HIGH (ref 70–140)
POTASSIUM: 4 meq/L (ref 3.5–5.1)
SODIUM: 139 meq/L (ref 136–145)

## 2013-08-15 MED ORDER — FENTANYL 50 MCG/HR TD PT72: 50.0000 ug | MEDICATED_PATCH | TRANSDERMAL | Status: DC

## 2013-08-15 NOTE — Progress Notes (Signed)
To provide support, encouragement and care continuity, met with patient during weekly UT with Dr. Isidore Moos.  She encourage him to maintain current fluid intake and to increase as able, encouraged him to eat soft hight protein foods such as eggs.  I will follow-up on these recommendations later this week during RT appt.  He asked me to fax SCAT voucher for today which I did, confirmation received. I escorted patient to [facility] lobby and began registration procedure for labs ordered by Dr. Isidore Moos.  Initiating navigation as L2 patient (treatments established) with this encounter.  Gayleen Orem, RN, BSN, [facility] Head & Neck Oncology Navigator [phone]

## 2013-08-15 NOTE — Progress Notes (Signed)
Melvin Hudson has received  14 treatments to his Tonsil and bilateral neck  Oral mucosa intact, and note Thick stringy saliva for which he is frequently rinsing with a baking soda, salt and water mixture. The skin on his neck demonstrates increased pigmentation, but remains intact.He is c/o of increased pain in throat and diffculty swallowing (even his spit).  He reports increased pain in his throat of a level 8/10 and is having increased difficulty sleeping which he and his in-home nurse reported today.  Wearing a 25 mcg Fentanyl patch with Oxycodone 10-325 mg tabs.  Nurse requesting increase dosage on the patch to increase his comfort.

## 2013-08-15 NOTE — Progress Notes (Signed)
   Weekly Management Note:  outpatient Current Dose:  28 Gy  Projected Dose: 70 Gy    ICD-9-CM   1. 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  CBCT/MVCT images/Port film x-rays were reviewed.  The chart was checked. Thick stringy saliva for which he is frequently rinsing with a baking soda, salt and water mixture. Lidocaine MW doesn't help pain. Pain is severe in throat despite fentanyl 25 mcg and oxycodone tabs. Eating noodles intermittently.  Shakes, water, good hydration per patient. No lightheadedness.   Physical Findings:  height is 6\' 2"  (1.88 m) and weight is 312 lb (141.522 kg). His temperature is 98.2 F (36.8 C). His blood pressure is 116/73 and his pulse is 102.  persistent right tonsillar tumor, stringy saliva, no thrush, + erythema. Subtle firmness in right cervical neck. Skin intact.  CBC    Component Value Date/Time   WBC 11.7* 07/09/2013 0443   WBC 10.2 06/30/2013 1437   RBC 4.13* 07/09/2013 0443   RBC 4.26 06/30/2013 1437   HGB 11.7* 07/09/2013 0443   HGB 12.3* 06/30/2013 1437   HCT 34.1* 07/09/2013 0443   HCT 35.4* 06/30/2013 1437   PLT 334 07/09/2013 0443   PLT 334 06/30/2013 1437   MCV 82.6 07/09/2013 0443   MCV 83.1 06/30/2013 1437   MCH 28.3 07/09/2013 0443   MCH 28.9 06/30/2013 1437   MCHC 34.3 07/09/2013 0443   MCHC 34.8 06/30/2013 1437   RDW 16.8* 07/09/2013 0443   RDW 16.8* 06/30/2013 1437   LYMPHSABS 2.3 06/30/2013 1437   LYMPHSABS 2.0 09/17/2011 1242   MONOABS 0.8 06/30/2013 1437   MONOABS 0.5 09/17/2011 1242   EOSABS 0.4 06/30/2013 1437   EOSABS 0.0 09/17/2011 1242   BASOSABS 0.0 06/30/2013 1437   BASOSABS 0.0 09/17/2011 1242     CMP     Component Value Date/Time   NA 139 08/15/2013 1428   NA 137 07/09/2013 0443   K 4.0 08/15/2013 1428   K 4.2 07/09/2013 0443   CL 98 07/09/2013 0443   CO2 29 08/15/2013 1428   CO2 28 07/09/2013  0443   GLUCOSE 155* 08/15/2013 1428   GLUCOSE 138* 07/09/2013 0443   BUN 14.3 08/15/2013 1428   BUN 9 07/09/2013 0443   CREATININE 1.2 08/15/2013 1428   CREATININE 0.94 07/09/2013 0443   CALCIUM 9.8 08/15/2013 1428   CALCIUM 9.2 07/09/2013 0443   PROT 7.3 06/30/2013 1438   ALBUMIN 3.9 06/30/2013 1438   AST 13 06/30/2013 1438   ALT 11 06/30/2013 1438   ALKPHOS 147 06/30/2013 1438   BILITOT 0.59 06/30/2013 1438   GFRNONAA 90* 07/09/2013 0443   GFRAA >90 07/09/2013 0443     Impression:  The patient is tolerating radiotherapy.   Plan:  Continue radiotherapy as planned. Fentanyl increased to 49mcg today for throat pain. I encouraged him to maintain current fluid intake and to increase as able, encouraged him to eat soft hight protein foods such as eggs. Continue home health assistance.   -----------------------------------  Eppie Gibson, MD

## 2013-08-16 ENCOUNTER — Ambulatory Visit
Admission: RE | Admit: 2013-08-16 | Discharge: 2013-08-16 | Disposition: A | Discharge status: Home / Self Care | Payer: Medicare Other | Source: Ambulatory Visit | Attending: Radiation Oncology | Admitting: Radiation Oncology

## 2013-08-16 DIAGNOSIS — Z510 Encounter for antineoplastic radiation therapy: Secondary | ICD-10-CM | POA: Diagnosis not present

## 2013-08-17 ENCOUNTER — Ambulatory Visit
Admission: RE | Admit: 2013-08-17 | Discharge: 2013-08-17 | Disposition: A | Discharge status: Home / Self Care | Payer: Medicare Other | Source: Ambulatory Visit | Attending: Radiation Oncology | Admitting: Radiation Oncology

## 2013-08-17 DIAGNOSIS — Z510 Encounter for antineoplastic radiation therapy: Secondary | ICD-10-CM | POA: Diagnosis not present

## 2013-08-18 ENCOUNTER — Other Ambulatory Visit: Payer: Self-pay | Admitting: Radiation Oncology

## 2013-08-18 ENCOUNTER — Ambulatory Visit
Admission: RE | Admit: 2013-08-18 | Discharge: 2013-08-18 | Disposition: A | Discharge status: Home / Self Care | Payer: Medicare Other | Source: Ambulatory Visit | Attending: Radiation Oncology | Admitting: Radiation Oncology

## 2013-08-18 ENCOUNTER — Encounter: Payer: Self-pay | Admitting: *Deleted

## 2013-08-18 DIAGNOSIS — Z510 Encounter for antineoplastic radiation therapy: Secondary | ICD-10-CM | POA: Diagnosis not present

## 2013-08-18 MED ORDER — OXYCODONE-ACETAMINOPHEN 10-325 MG PO TABS: 1.0000 | ORAL_TABLET | Freq: Three times a day (TID) | ORAL | Status: DC | PRN

## 2013-08-18 NOTE — Progress Notes (Signed)
Patient saw me after RT, indicated he needed Rx for Percocet as he had only 2 tabs remaining.  Dr. Isidore Moos who has arranged with patient's pain mgt MD to manage pain medications while patient is being treated at [facility] not available; Dr. Kyung Rudd, RadOnc doc on-call, issued Rx for Percocet 15 tabs, 1 tab by mouth TID as needed for pain.  I provided patient script, indicated he can request additional when he sees Dr. Isidore Moos on Monday during UT appt with her.  He verbalized understanding.  Patient requested that I fax SCAT vouchers for today and yesterday which I did, confirmation rec'd.  Gayleen Orem, RN, BSN, [facility] Head & Neck Oncology Navigator [phone]

## 2013-08-22 ENCOUNTER — Ambulatory Visit
Admission: RE | Admit: 2013-08-22 | Discharge: 2013-08-22 | Disposition: A | Discharge status: Home / Self Care | Payer: Medicare Other | Source: Ambulatory Visit | Attending: Radiation Oncology | Admitting: Radiation Oncology

## 2013-08-22 ENCOUNTER — Telehealth: Payer: Self-pay | Admitting: *Deleted

## 2013-08-22 ENCOUNTER — Encounter: Payer: Self-pay | Admitting: Radiation Oncology

## 2013-08-22 ENCOUNTER — Encounter: Payer: Self-pay | Admitting: Nutrition

## 2013-08-22 ENCOUNTER — Encounter (INDEPENDENT_AMBULATORY_CARE_PROVIDER_SITE_OTHER): Payer: Self-pay

## 2013-08-22 VITALS — BP 121/77 | HR 90 | Temp 97.6°F | Ht 74.0 in | Wt 307.7 lb

## 2013-08-22 DIAGNOSIS — Z510 Encounter for antineoplastic radiation therapy: Secondary | ICD-10-CM | POA: Diagnosis not present

## 2013-08-22 DIAGNOSIS — C099 Malignant neoplasm of tonsil, unspecified: Secondary | ICD-10-CM

## 2013-08-22 MED ORDER — OXYCODONE-ACETAMINOPHEN 10-325 MG PO TABS
1.0000 | ORAL_TABLET | ORAL | Status: DC | PRN
Start: 2013-08-22 — End: 2013-08-29

## 2013-08-22 NOTE — Telephone Encounter (Signed)
Dr. Isidore Moos gave a verbal order for the continuation of nurse monitoring per [place].

## 2013-08-22 NOTE — Progress Notes (Signed)
Provided second complimentary case of Ensure Plus. 

## 2013-08-22 NOTE — Progress Notes (Addendum)
Melvin Hudson has received 18 fractions /35 to his Tonsil/bilateral neck.  He c/o sore throat at a level 7/10.  He has a Fentanyl patch 50 mcg in place in addition to Oxycodone 10-325 mg tabs prn.  He has lost 5 lbs since 08/15/13.  Discussion today on how to moisten foods with cream based soups and adding cheese to cooked foods for added protein. Promoting use of his bullet to grind meats to ease swallowing.  Advised to decrease the amount of caffeinated drinks and he agreed.  He has a caregiver at home.  Skin on neck intact and oral mucosa moist/intact with thickened saliva.

## 2013-08-22 NOTE — Progress Notes (Signed)
   Weekly Management Note:  Outpatient Current Dose:  36 Gy  Projected Dose: 70 Gy    ICD-9-CM   1. 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  CBCT/MVCT images/Port film x-rays were reviewed.  The chart was checked. He c/o sore throat at a level 7/10. He has a Fentanyl patch 50 mcg in place in addition to Oxycodone 10-325 mg tabs prn. He has lost 5 lbs since 08/15/13. Eating soft foods and 1-2 shakes daily. Drinking fluids well. He has a home health nurse caregiver at home intermittently  Physical Findings:  height is 6\' 2"  (1.88 m) and weight is 307 lb 11.2 oz (139.572 kg). His temperature is 97.6 F (36.4 C). His blood pressure is 121/77 and his pulse is 90.  Skin on neck intact and oral mucosa moist with thickened saliva, no thrush; tumoritis over right tonsil  CBC    Component Value Date/Time   WBC 11.7* 07/09/2013 0443   WBC 10.2 06/30/2013 1437   RBC 4.13* 07/09/2013 0443   RBC 4.26 06/30/2013 1437   HGB 11.7* 07/09/2013 0443   HGB 12.3* 06/30/2013 1437   HCT 34.1* 07/09/2013 0443   HCT 35.4* 06/30/2013 1437   PLT 334 07/09/2013 0443   PLT 334 06/30/2013 1437   MCV 82.6 07/09/2013 0443   MCV 83.1 06/30/2013 1437   MCH 28.3 07/09/2013 0443   MCH 28.9 06/30/2013 1437   MCHC 34.3 07/09/2013 0443   MCHC 34.8 06/30/2013 1437   RDW 16.8* 07/09/2013 0443   RDW 16.8* 06/30/2013 1437   LYMPHSABS 2.3 06/30/2013 1437   LYMPHSABS 2.0 09/17/2011 1242   MONOABS 0.8 06/30/2013 1437   MONOABS 0.5 09/17/2011 1242   EOSABS 0.4 06/30/2013 1437   EOSABS 0.0 09/17/2011 1242   BASOSABS 0.0 06/30/2013 1437   BASOSABS 0.0 09/17/2011 1242     CMP     Component Value Date/Time   NA 139 08/15/2013 1428   NA 137 07/09/2013 0443   K 4.0 08/15/2013 1428   K 4.2 07/09/2013 0443   CL 98 07/09/2013 0443   CO2 29 08/15/2013 1428   CO2 28 07/09/2013 0443   GLUCOSE 155* 08/15/2013 1428   GLUCOSE 138*  07/09/2013 0443   BUN 14.3 08/15/2013 1428   BUN 9 07/09/2013 0443   CREATININE 1.2 08/15/2013 1428   CREATININE 0.94 07/09/2013 0443   CALCIUM 9.8 08/15/2013 1428   CALCIUM 9.2 07/09/2013 0443   PROT 7.3 06/30/2013 1438   ALBUMIN 3.9 06/30/2013 1438   AST 13 06/30/2013 1438   ALT 11 06/30/2013 1438   ALKPHOS 147 06/30/2013 1438   BILITOT 0.59 06/30/2013 1438   GFRNONAA 90* 07/09/2013 0443   GFRAA >90 07/09/2013 0443     Impression:  The patient is tolerating radiotherapy.   Plan:  Continue radiotherapy as planned. Recheck labs next week. Discussion today on how to moisten foods with cream based soups and adding cheese to cooked foods for added protein. Promoting use of his bullet to grind meats to ease swallowing. Advised to decrease the amount of caffeinated drinks and he agreed. More ensure given to him by Dory Peru.   Refill on Oxy/acet 10/325 given. May take 1 tab q 4 hrs rather than TID for pain. -----------------------------------  Eppie Gibson, MD

## 2013-08-23 ENCOUNTER — Ambulatory Visit
Admission: RE | Admit: 2013-08-23 | Discharge: 2013-08-23 | Disposition: A | Discharge status: Home / Self Care | Payer: Medicare Other | Source: Ambulatory Visit | Attending: Radiation Oncology | Admitting: Radiation Oncology

## 2013-08-23 DIAGNOSIS — Z510 Encounter for antineoplastic radiation therapy: Secondary | ICD-10-CM | POA: Diagnosis not present

## 2013-08-24 ENCOUNTER — Ambulatory Visit
Admission: RE | Admit: 2013-08-24 | Discharge: 2013-08-24 | Disposition: A | Discharge status: Home / Self Care | Payer: Medicare Other | Source: Ambulatory Visit | Attending: Radiation Oncology | Admitting: Radiation Oncology

## 2013-08-24 DIAGNOSIS — Z510 Encounter for antineoplastic radiation therapy: Secondary | ICD-10-CM | POA: Diagnosis not present

## 2013-08-25 ENCOUNTER — Ambulatory Visit
Admission: RE | Admit: 2013-08-25 | Discharge: 2013-08-25 | Disposition: A | Discharge status: Home / Self Care | Payer: Medicare Other | Source: Ambulatory Visit | Attending: Radiation Oncology | Admitting: Radiation Oncology

## 2013-08-25 ENCOUNTER — Encounter: Payer: Self-pay | Admitting: *Deleted

## 2013-08-25 DIAGNOSIS — Z510 Encounter for antineoplastic radiation therapy: Secondary | ICD-10-CM | POA: Diagnosis not present

## 2013-08-26 ENCOUNTER — Ambulatory Visit
Admission: RE | Admit: 2013-08-26 | Discharge: 2013-08-26 | Disposition: A | Discharge status: Home / Self Care | Payer: Medicare Other | Source: Ambulatory Visit | Attending: Radiation Oncology | Admitting: Radiation Oncology

## 2013-08-26 DIAGNOSIS — Z510 Encounter for antineoplastic radiation therapy: Secondary | ICD-10-CM | POA: Diagnosis not present

## 2013-08-29 ENCOUNTER — Ambulatory Visit
Admission: RE | Admit: 2013-08-29 | Discharge: 2013-08-29 | Disposition: A | Discharge status: Home / Self Care | Payer: Medicare Other | Source: Ambulatory Visit | Attending: Radiation Oncology | Admitting: Radiation Oncology

## 2013-08-29 ENCOUNTER — Encounter: Payer: Self-pay | Admitting: Radiation Oncology

## 2013-08-29 VITALS — BP 137/86 | HR 61 | Temp 98.2°F | Ht 74.0 in | Wt 311.4 lb

## 2013-08-29 DIAGNOSIS — C099 Malignant neoplasm of tonsil, unspecified: Secondary | ICD-10-CM

## 2013-08-29 DIAGNOSIS — Z510 Encounter for antineoplastic radiation therapy: Secondary | ICD-10-CM | POA: Diagnosis not present

## 2013-08-29 MED ORDER — OXYCODONE-ACETAMINOPHEN 10-325 MG PO TABS
1.0000 | ORAL_TABLET | ORAL | Status: DC | PRN
Start: 2013-08-29 — End: 2013-09-05

## 2013-08-29 MED ORDER — METOPROLOL SUCCINATE ER 50 MG PO TB24: 50.0000 mg | ORAL_TABLET | Freq: Every morning | ORAL | Status: DC

## 2013-08-29 NOTE — Progress Notes (Signed)
   Weekly Management Note:  outpatient Current Dose:  46 Gy  Projected Dose: 60 Gy    ICD-9-CM   1. 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  CBCT/MVCT images/Port film x-rays were reviewed.  The chart was checked. Needs refill on Metoprolol and basically discharged himself from prior PCP due to billing issues.  Has no new PCP yet but says his home health nurse rec'd one to him. Needs percocet refill, but ok on Duragesic. Pain well controlled. Feels better this week and gaining weight.  Physical Findings:  height is 6\' 2"  (1.88 m) and weight is 311 lb 6.4 oz (141.25 kg). His temperature is 98.2 F (36.8 C). His blood pressure is 137/86 and his pulse is 61.  NAD, mucosa dry with ropey saliva.  Right tonsil tumor has regressed somewhat but is still visible.  Neck - adenopathy is still regressing. Skin intact, dry.  CBC    Component Value Date/Time   WBC 11.7* 07/09/2013 0443   WBC 10.2 06/30/2013 1437   RBC 4.13* 07/09/2013 0443   RBC 4.26 06/30/2013 1437   HGB 11.7* 07/09/2013 0443   HGB 12.3* 06/30/2013 1437   HCT 34.1* 07/09/2013 0443   HCT 35.4* 06/30/2013 1437   PLT 334 07/09/2013 0443   PLT 334 06/30/2013 1437   MCV 82.6 07/09/2013 0443   MCV 83.1 06/30/2013 1437   MCH 28.3 07/09/2013 0443   MCH 28.9 06/30/2013 1437   MCHC 34.3 07/09/2013 0443   MCHC 34.8 06/30/2013 1437   RDW 16.8* 07/09/2013 0443   RDW 16.8* 06/30/2013 1437   LYMPHSABS 2.3 06/30/2013 1437   LYMPHSABS 2.0 09/17/2011 1242   MONOABS 0.8 06/30/2013 1437   MONOABS 0.5 09/17/2011 1242   EOSABS 0.4 06/30/2013 1437   EOSABS 0.0 09/17/2011 1242   BASOSABS 0.0 06/30/2013 1437   BASOSABS 0.0 09/17/2011 1242     CMP     Component Value Date/Time   NA 139 08/15/2013 1428   NA 137 07/09/2013 0443   K 4.0 08/15/2013 1428   K 4.2 07/09/2013 0443   CL 98  07/09/2013 0443   CO2 29 08/15/2013 1428   CO2 28 07/09/2013 0443   GLUCOSE 155* 08/15/2013 1428   GLUCOSE 138* 07/09/2013 0443   BUN 14.3 08/15/2013 1428   BUN 9 07/09/2013 0443   CREATININE 1.2 08/15/2013 1428   CREATININE 0.94 07/09/2013 0443   CALCIUM 9.8 08/15/2013 1428   CALCIUM 9.2 07/09/2013 0443   PROT 7.3 06/30/2013 1438   ALBUMIN 3.9 06/30/2013 1438   AST 13 06/30/2013 1438   ALT 11 06/30/2013 1438   ALKPHOS 147 06/30/2013 1438   BILITOT 0.59 06/30/2013 1438   GFRNONAA 90* 07/09/2013 0443   GFRAA >90 07/09/2013 0443     Impression:  The patient is tolerating radiotherapy.   Plan:  Continue radiotherapy as planned.  Refills provided for percocet and metoprolol but he needs a PCP for future BP meds. Will ask Gayleen Orem to help facilitate this. -----------------------------------  Eppie Gibson, MD

## 2013-08-29 NOTE — Progress Notes (Signed)
Mr. Jerger has received 23 fractions to his right tonsil and bilateral neck.  He denies any sore throat nor difficulty swallowing.  He has gained 5 lbs since 08/22/13.  He reports that his mouth is drier, but states that his saliva is not as thick as per usual since starting treatment.  His oral mucosa is moist with some stringy saliva.  Skin on his neck is hyperpigmented, soft and intact.  Using Biafine once or twice daily.

## 2013-08-30 ENCOUNTER — Ambulatory Visit
Admission: RE | Admit: 2013-08-30 | Discharge: 2013-08-30 | Disposition: A | Discharge status: Home / Self Care | Payer: Medicare Other | Source: Ambulatory Visit | Attending: Radiation Oncology | Admitting: Radiation Oncology

## 2013-08-30 DIAGNOSIS — Z510 Encounter for antineoplastic radiation therapy: Secondary | ICD-10-CM | POA: Diagnosis not present

## 2013-08-31 ENCOUNTER — Ambulatory Visit
Admission: RE | Admit: 2013-08-31 | Discharge: 2013-08-31 | Disposition: A | Discharge status: Home / Self Care | Payer: Medicare Other | Source: Ambulatory Visit | Attending: Radiation Oncology | Admitting: Radiation Oncology

## 2013-08-31 DIAGNOSIS — Z510 Encounter for antineoplastic radiation therapy: Secondary | ICD-10-CM | POA: Diagnosis not present

## 2013-09-01 ENCOUNTER — Ambulatory Visit
Admission: RE | Admit: 2013-09-01 | Discharge: 2013-09-01 | Disposition: A | Discharge status: Home / Self Care | Payer: Medicare Other | Source: Ambulatory Visit | Attending: Radiation Oncology | Admitting: Radiation Oncology

## 2013-09-01 DIAGNOSIS — Z510 Encounter for antineoplastic radiation therapy: Secondary | ICD-10-CM | POA: Diagnosis not present

## 2013-09-02 ENCOUNTER — Ambulatory Visit: Payer: Medicare Other | Admitting: Nutrition

## 2013-09-02 ENCOUNTER — Ambulatory Visit
Admission: RE | Admit: 2013-09-02 | Discharge: 2013-09-02 | Disposition: A | Discharge status: Home / Self Care | Payer: Medicare Other | Source: Ambulatory Visit | Attending: Radiation Oncology | Admitting: Radiation Oncology

## 2013-09-02 DIAGNOSIS — Z510 Encounter for antineoplastic radiation therapy: Secondary | ICD-10-CM | POA: Diagnosis not present

## 2013-09-02 NOTE — Progress Notes (Signed)
Patient reports good variety food intake.  He continues to drink 2-3 Ensure Plus daily for additional calories and protein.  Weight generally stable documented today 310.8 pounds.  Nutrition diagnosis: Unintended weight loss has improved.  Intervention: Patient to continue small, frequent, high-calorie, high-protein meals and snacks. Recommend Ensure Plus 3 times a day. Provided second complimentary case of Ensure Plus. Teach back method used.  Monitoring, evaluation, goals: Patient will continue adequate oral intake to promote weight stabilization.  Next visit: Friday, July 24.   **Disclaimer: This note was dictated with voice recognition software. Similar sounding words can inadvertently be transcribed and this note may contain transcription errors which may not have been corrected upon publication of note.**

## 2013-09-03 NOTE — Progress Notes (Signed)
To maintain support, encouragement and care continuity, met with Barbaraann Rondo after RT.  He denied needs/concerns at this time, verbalized understanding he can contact me if that changes.  He requested that I fax SCAT vouchers for past several days which I did, fax confirmation on file.  Continuing navigation as L2 patient (treatments established).  Gayleen Orem, RN, BSN, [facility] Head & Neck Oncology Navigator [phone]

## 2013-09-05 ENCOUNTER — Ambulatory Visit
Admission: RE | Admit: 2013-09-05 | Discharge: 2013-09-05 | Disposition: A | Discharge status: Home / Self Care | Payer: Medicare Other | Source: Ambulatory Visit | Attending: Radiation Oncology | Admitting: Radiation Oncology

## 2013-09-05 ENCOUNTER — Encounter: Payer: Self-pay | Admitting: *Deleted

## 2013-09-05 ENCOUNTER — Encounter: Payer: Self-pay | Admitting: Radiation Oncology

## 2013-09-05 VITALS — BP 136/85 | HR 69 | Temp 97.8°F | Ht 74.0 in | Wt 310.4 lb

## 2013-09-05 DIAGNOSIS — C099 Malignant neoplasm of tonsil, unspecified: Secondary | ICD-10-CM

## 2013-09-05 DIAGNOSIS — Z510 Encounter for antineoplastic radiation therapy: Secondary | ICD-10-CM | POA: Diagnosis not present

## 2013-09-05 MED ORDER — OXYCODONE-ACETAMINOPHEN 10-325 MG PO TABS: 1.0000 | ORAL_TABLET | ORAL | Status: DC | PRN

## 2013-09-05 MED ORDER — FENTANYL 50 MCG/HR TD PT72: 50.0000 ug | MEDICATED_PATCH | TRANSDERMAL | Status: DC

## 2013-09-05 MED ORDER — FLUCONAZOLE 100 MG PO TABS: ORAL_TABLET | ORAL | Status: DC

## 2013-09-05 NOTE — Progress Notes (Signed)
To provide support, encouragement and care continuity, met with pt during weekly UT with Dr. Isidore Moos.  Melvin Hudson reports that he is eating soft and textured foods, drinking multiple bottles of water/Gaterade daily.  He states daily home visits by nurses' aid and weekly visit by home health RN.  He reports fatigue but feels like he is returning somewhat to baseline activity.  Continuing navigation as L2 patient (treatments established).  Gayleen Orem, RN, BSN, [facility] Head & Neck Oncology Navigator [phone]

## 2013-09-05 NOTE — Progress Notes (Signed)
   Weekly Management Note:  outpatient Current Dose:  56 Gy  Projected Dose: 70 Gy    ICD-9-CM   1. 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  CBCT/MVCT images/Port film x-rays were reviewed.  The chart was checked. He C/O level 6 pain in his throat which is helped by Fentanyl and Oxycodone.   He is eating chopped up sausage patties mixed with eggs and beanie wieners chopped up. He reports change in taste presently. Weight stable.  Physical Findings:  height is 6\' 2"  (1.88 m) and weight is 310 lb 6.4 oz (140.797 kg). His temperature is 97.8 F (36.6 C). His blood pressure is 136/85 and his pulse is 69.  Scant thrush in oropharynx, with still visible tonsil tumor.   Skin on neck with hyperpigmentation and without any irritation. No obvious adenopathy in neck  CBC    Component Value Date/Time   WBC 11.7* 07/09/2013 0443   WBC 10.2 06/30/2013 1437   RBC 4.13* 07/09/2013 0443   RBC 4.26 06/30/2013 1437   HGB 11.7* 07/09/2013 0443   HGB 12.3* 06/30/2013 1437   HCT 34.1* 07/09/2013 0443   HCT 35.4* 06/30/2013 1437   PLT 334 07/09/2013 0443   PLT 334 06/30/2013 1437   MCV 82.6 07/09/2013 0443   MCV 83.1 06/30/2013 1437   MCH 28.3 07/09/2013 0443   MCH 28.9 06/30/2013 1437   MCHC 34.3 07/09/2013 0443   MCHC 34.8 06/30/2013 1437   RDW 16.8* 07/09/2013 0443   RDW 16.8* 06/30/2013 1437   LYMPHSABS 2.3 06/30/2013 1437   LYMPHSABS 2.0 09/17/2011 1242   MONOABS 0.8 06/30/2013 1437   MONOABS 0.5 09/17/2011 1242   EOSABS 0.4 06/30/2013 1437   EOSABS 0.0 09/17/2011 1242   BASOSABS 0.0 06/30/2013 1437   BASOSABS 0.0 09/17/2011 1242     CMP     Component Value Date/Time   NA 139 08/15/2013 1428   NA 137 07/09/2013 0443   K 4.0 08/15/2013 1428   K 4.2 07/09/2013 0443   CL 98 07/09/2013 0443   CO2 29  08/15/2013 1428   CO2 28 07/09/2013 0443   GLUCOSE 155* 08/15/2013 1428   GLUCOSE 138* 07/09/2013 0443   BUN 14.3 08/15/2013 1428   BUN 9 07/09/2013 0443   CREATININE 1.2 08/15/2013 1428   CREATININE 0.94 07/09/2013 0443   CALCIUM 9.8 08/15/2013 1428   CALCIUM 9.2 07/09/2013 0443   PROT 7.3 06/30/2013 1438   ALBUMIN 3.9 06/30/2013 1438   AST 13 06/30/2013 1438   ALT 11 06/30/2013 1438   ALKPHOS 147 06/30/2013 1438   BILITOT 0.59 06/30/2013 1438   GFRNONAA 90* 07/09/2013 0443   GFRAA >90 07/09/2013 0443     Impression:  The patient is tolerating radiotherapy.   Plan:  Continue radiotherapy as planned. Refills on pain meds given, and fluconazole rx given for thrush.  -----------------------------------  Eppie Gibson, MD

## 2013-09-05 NOTE — Progress Notes (Signed)
Melvin Hudson has received 28 fractions to his Tonsil and bilateral neck.  He C/O level 6 pain in his throat.  Note mild redness and whitish area on the left side of his throat on his uvula and his thickened saliva.  He is eating chopped up sausage patties mixed with eggs and beanie wieners chopped up.  He reports change in taste presently.  Skin on neck with hyperpigmentation and without any irritation.

## 2013-09-06 ENCOUNTER — Ambulatory Visit
Admission: RE | Admit: 2013-09-06 | Discharge: 2013-09-06 | Disposition: A | Discharge status: Home / Self Care | Payer: Medicare Other | Source: Ambulatory Visit | Attending: Radiation Oncology | Admitting: Radiation Oncology

## 2013-09-06 DIAGNOSIS — Z510 Encounter for antineoplastic radiation therapy: Secondary | ICD-10-CM | POA: Diagnosis not present

## 2013-09-07 ENCOUNTER — Ambulatory Visit
Admission: RE | Admit: 2013-09-07 | Discharge: 2013-09-07 | Disposition: A | Discharge status: Home / Self Care | Payer: Medicare Other | Source: Ambulatory Visit | Attending: Radiation Oncology | Admitting: Radiation Oncology

## 2013-09-07 DIAGNOSIS — Z510 Encounter for antineoplastic radiation therapy: Secondary | ICD-10-CM | POA: Diagnosis not present

## 2013-09-08 ENCOUNTER — Ambulatory Visit
Admission: RE | Admit: 2013-09-08 | Discharge: 2013-09-08 | Disposition: A | Discharge status: Home / Self Care | Payer: Medicare Other | Source: Ambulatory Visit | Attending: Radiation Oncology | Admitting: Radiation Oncology

## 2013-09-08 ENCOUNTER — Encounter: Payer: Self-pay | Admitting: *Deleted

## 2013-09-08 DIAGNOSIS — Z510 Encounter for antineoplastic radiation therapy: Secondary | ICD-10-CM | POA: Diagnosis not present

## 2013-09-08 NOTE — Progress Notes (Addendum)
Saw pt in [facility] lobby prior to his daily RT.  He stated he has Percocet Rx for this week and next week (wk of 7/27) but will need one for wk of 8/3. B/c Dr. Isidore Moos is on vacation next week, he inquired about getting script this week though I explained her covering partner could provide.  He reports taking 3-4 tablets daily for throat pain rated 7-8/10.  I indicated I wd forward request to Dr. Isidore Moos and follow-up with him.  Gayleen Orem, RN, BSN, [facility] Head & Neck Oncology Navigator [phone]

## 2013-09-09 ENCOUNTER — Ambulatory Visit: Payer: Medicare Other | Admitting: Nutrition

## 2013-09-09 ENCOUNTER — Ambulatory Visit
Admission: RE | Admit: 2013-09-09 | Discharge: 2013-09-09 | Disposition: A | Discharge status: Home / Self Care | Payer: Medicare Other | Source: Ambulatory Visit | Attending: Radiation Oncology | Admitting: Radiation Oncology

## 2013-09-09 DIAGNOSIS — Z510 Encounter for antineoplastic radiation therapy: Secondary | ICD-10-CM | POA: Diagnosis not present

## 2013-09-09 NOTE — Progress Notes (Signed)
Duplicate entry attempt.

## 2013-09-09 NOTE — Progress Notes (Signed)
Patient brought to nursing following radiation treatment today with c/o "my eye is red". Pt's right eye is red, no drainage noted, and he denies pain, discomfort in his eye. Pt states "my nose has been runny and maybe it's allergies". Advised he take allergy medication and see if his eye improves. If not, advised he go to urgent care this weekend as pt states he has no PCP. Pt stated he would go to drug store and buy allergy medication today. Reminded pt he will see Dr Valere Dross on Monday for weekly put. Pt verbalized understanding, agreement to above.

## 2013-09-09 NOTE — Progress Notes (Signed)
Followup completed with patient receiving radiation therapy for tonsil cancer.  Weight decreased slightly and documented at 306.4 pounds.  Patient reports his taste is changing and that has led to slightly decreased oral intake.  He continues to cook soft foods for himself and drink oral nutrition supplements to provide additional calories and protein.  Final treatment scheduled for next Wednesday, July 29.  Nutrition diagnosis: Unintended weight loss continues.  Intervention: Patient will continue small, frequent, high-calorie, high-protein meals and snacks. Recommended Ensure Plus 3 times a day. Teach back method used.  Monitoring, evaluation, goals: Patient will continue adequate oral intake to promote weight stabilization.  Next visit: No followup is scheduled.  Patient has contact information for further questions or concerns.  **Disclaimer: This note was dictated with voice recognition software. Similar sounding words can inadvertently be transcribed and this note may contain transcription errors which may not have been corrected upon publication of note.**

## 2013-09-12 ENCOUNTER — Ambulatory Visit
Admission: RE | Admit: 2013-09-12 | Discharge: 2013-09-12 | Disposition: A | Discharge status: Home / Self Care | Payer: Medicare Other | Source: Ambulatory Visit | Attending: Radiation Oncology | Admitting: Radiation Oncology

## 2013-09-12 ENCOUNTER — Encounter: Payer: Self-pay | Admitting: Radiation Oncology

## 2013-09-12 VITALS — BP 131/84 | HR 74 | Temp 98.1°F | Ht 74.0 in | Wt 303.0 lb

## 2013-09-12 DIAGNOSIS — Z510 Encounter for antineoplastic radiation therapy: Secondary | ICD-10-CM | POA: Diagnosis not present

## 2013-09-12 DIAGNOSIS — C099 Malignant neoplasm of tonsil, unspecified: Secondary | ICD-10-CM

## 2013-09-12 MED ORDER — BIAFINE EX EMUL
Freq: Two times a day (BID) | CUTANEOUS | Status: DC
  Administered 2013-09-12: 2 via TOPICAL

## 2013-09-12 NOTE — Progress Notes (Signed)
Weekly Management Note:  Site: Right tonsil/bilateral neck Current Dose:  6600  cGy Projected Dose: 7000  cGy  Narrative: The patient is seen today for routine under treatment assessment. CBCT/MVCT images/port films were reviewed. The chart was reviewed.   He still doing well with decent pain control with fentanyl 50 mcg patch along with oxycodone/APAP 10/325 2 times a day. He has just 2 more treatments of radiation therapy. His weight is down 3 pounds over the past week.  Physical Examination:  Filed Vitals:   09/12/13 1337  BP: 131/84  Pulse: 74  Temp: 98.1 F (36.7 C)  .  Weight: 303 lb (137.44 kg). On inspection of the oral cavity/oropharynx there is a confluent mucositis along the right oropharynx. There is dry desquamation with focal moist desquamation along his right neck.  Laboratory data: Lab Results  Component Value Date   WBC 11.7* 07/09/2013   HGB 11.7* 07/09/2013   HCT 34.1* 07/09/2013   MCV 82.6 07/09/2013   PLT 334 07/09/2013     Impression: Tolerating radiation therapy well. 2 more fractions to go.  Plan: Continue radiation therapy as planned. Followup visit with Dr. Isidore Moos one month after completion of radiation therapy.

## 2013-09-12 NOTE — Progress Notes (Signed)
Mr. Haslam has received 33 fractions to his tonsil and bilateral neck.  He appears more fatigued today.  He reports sore throat as a level 6/10.  His oral mucosa is dry with thick saliva.  He has lost 7 lbs since 09/05/13.  He has purchased more "power drinks" and protein drinks.   Hyperpigmentation on entire neck with the beginnings of 2 small areas of dry desquamation.   Given more Biafine.  He already has a 2 week FU appt. With Dr. Isidore Moos.

## 2013-09-13 ENCOUNTER — Ambulatory Visit: Payer: Medicare Other

## 2013-09-13 DIAGNOSIS — Z510 Encounter for antineoplastic radiation therapy: Secondary | ICD-10-CM | POA: Diagnosis not present

## 2013-09-14 ENCOUNTER — Ambulatory Visit: Payer: Medicare Other

## 2013-09-15 ENCOUNTER — Ambulatory Visit
Admission: RE | Admit: 2013-09-15 | Discharge: 2013-09-15 | Disposition: A | Discharge status: Home / Self Care | Payer: Medicare Other | Source: Ambulatory Visit | Attending: Radiation Oncology | Admitting: Radiation Oncology

## 2013-09-15 ENCOUNTER — Encounter: Payer: Self-pay | Admitting: Radiation Oncology

## 2013-09-15 DIAGNOSIS — Z510 Encounter for antineoplastic radiation therapy: Secondary | ICD-10-CM | POA: Diagnosis not present

## 2013-09-18 NOTE — Progress Notes (Signed)
  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 He did not receive chemotherapy. He developed confluent mucositis along the right oropharynx. Also, dry desquamation with focal moist desquamation along his right neck. Fentanyl and Oxycodone Rx for pain.  Plan: The patient has completed radiation treatment. The patient will return to radiation oncology clinic for routine followup in one half month. I advised them to call or return sooner if they have any questions or concerns related to their recovery or treatment.  -----------------------------------  Eppie Gibson, MD

## 2013-09-19 ENCOUNTER — Telehealth: Payer: Self-pay | Admitting: *Deleted

## 2013-09-19 ENCOUNTER — Other Ambulatory Visit: Payer: Self-pay | Admitting: Radiation Oncology

## 2013-09-19 DIAGNOSIS — C099 Malignant neoplasm of tonsil, unspecified: Secondary | ICD-10-CM

## 2013-09-19 MED ORDER — OXYCODONE-ACETAMINOPHEN 10-325 MG PO TABS: 1.0000 | ORAL_TABLET | ORAL | Status: DC | PRN

## 2013-09-19 NOTE — Telephone Encounter (Signed)
Called pt at 4:45 to let him know Rx available for pick up tomorrow morning.  He expressed appreciation.  Gayleen Orem, RN, BSN, [facility] Head & Neck Oncology Navigator [phone]

## 2013-09-19 NOTE — Telephone Encounter (Signed)
Kento called with refill request for oxyCODONE-acetaminophen (PERCOCET) 10-325 MG per tablet.  Stated he will come by to pick up when ready.  Dr. Isidore Moos notified.  Gayleen Orem, RN, BSN, [facility] Head & Neck Oncology Navigator [phone]

## 2013-09-26 ENCOUNTER — Telehealth: Payer: Self-pay | Admitting: *Deleted

## 2013-09-26 NOTE — Telephone Encounter (Signed)
Called Melvin Hudson to see how is has been doing s/p final RT on 09/14/13.  He reported: 1)  Eating more solid food - "better, cooked some ribs over the weekend", ate the meat after scraping off bone; eating beef stew.   2)  Drinking several bottles of water daily.  Water still has slight metallic taste.  Acidity of juices continues to "burn" throat, not drinking carbonated drinks for same reason. 3)  Taste buds for sweet and salty haven't returned yet. 4)  Energy level improving.  Running errands. 5)  Neck "OK".  Applying Biafine BID.  Stated he is not protecting neck when he goes outside.  I encouraged him to cover with scarf or wear a wide-brimmed hat.  He verbalized understanding. 6)  In response to my inquiry, he stated his PCP Dr. [name] has started him on Benicar 40 mg BID for BP control.   Continuing to navigate as L3 (treatments completed) patient.  Gayleen Orem, RN, BSN, [facility] Head & Neck Oncology Navigator [phone]

## 2013-09-28 ENCOUNTER — Encounter: Payer: Self-pay | Admitting: Radiation Oncology

## 2013-09-30 ENCOUNTER — Ambulatory Visit
Admission: RE | Admit: 2013-09-30 | Discharge: 2013-09-30 | Disposition: A | Discharge status: Home / Self Care | Payer: Medicare Other | Source: Ambulatory Visit | Attending: Radiation Oncology | Admitting: Radiation Oncology

## 2013-09-30 ENCOUNTER — Encounter: Payer: Self-pay | Admitting: Radiation Oncology

## 2013-09-30 VITALS — BP 132/83 | HR 64 | Temp 97.9°F | Ht 74.0 in | Wt 302.9 lb

## 2013-09-30 DIAGNOSIS — Z888 Allergy status to other drugs, medicaments and biological substances status: Secondary | ICD-10-CM | POA: Insufficient documentation

## 2013-09-30 DIAGNOSIS — F17200 Nicotine dependence, unspecified, uncomplicated: Secondary | ICD-10-CM | POA: Insufficient documentation

## 2013-09-30 DIAGNOSIS — Z7982 Long term (current) use of aspirin: Secondary | ICD-10-CM | POA: Diagnosis not present

## 2013-09-30 DIAGNOSIS — Z79899 Other long term (current) drug therapy: Secondary | ICD-10-CM | POA: Insufficient documentation

## 2013-09-30 DIAGNOSIS — C099 Malignant neoplasm of tonsil, unspecified: Secondary | ICD-10-CM | POA: Insufficient documentation

## 2013-09-30 DIAGNOSIS — Z886 Allergy status to analgesic agent status: Secondary | ICD-10-CM | POA: Insufficient documentation

## 2013-09-30 HISTORY — DX: Personal history of irradiation: Z92.3

## 2013-09-30 MED ORDER — FENTANYL 25 MCG/HR TD PT72: 25.0000 ug | MEDICATED_PATCH | TRANSDERMAL | Status: DC

## 2013-09-30 MED ORDER — OXYCODONE-ACETAMINOPHEN 10-325 MG PO TABS
1.0000 | ORAL_TABLET | ORAL | Status: DC | PRN
Start: 2013-09-30 — End: 2013-10-17

## 2013-09-30 MED ORDER — BIAFINE EX EMUL
Freq: Two times a day (BID) | CUTANEOUS | Status: DC
  Administered 2013-09-30: 16:00:00 via TOPICAL

## 2013-09-30 NOTE — Progress Notes (Signed)
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  He presents with report of level 4/10 soreness of his throat, and notable dry desquamation of his anterior and bilateral neck. Continues to use to use Biafine BID and given a tube today. His oral mucosa is moist and intact with, as he states, less thickness of saliva since the end of treatment. Eating soft foods and meats such as rotisserie chicken, cut up sausage, and beef stew. Drinking at least 1 ensure daily. Unable taste sweet or salt and states "some food taste funny". Maintaining weight. Questions about when next scan will be scheduled.  Fishing for fun.                               ALLERGIES:  is allergic to advil and azilsartan.  Meds: Current Outpatient Prescriptions  Medication Sig Dispense Refill  . Alum & Mag Hydroxide-Simeth (MAGIC MOUTHWASH W/LIDOCAINE) SOLN 1 part 2% viscous lidocaine, 1 part water. Swish, gargle, and/or swallow 20mL QID, prn sore mouth/throat. Use 76min before meals & bedtime.  480 mL  5  . Cholecalciferol (VITAMIN D-3 PO) Take 1,000 Units by mouth daily.      . diphenhydrAMINE (BENADRYL) 25 MG tablet Take 1 tablet (25 mg total) by mouth every 6 (six) hours as needed for itching.  30 tablet  0  . emollient (BIAFINE) cream Apply topically 2 (two) times daily.      . fentaNYL (DURAGESIC - DOSED MCG/HR) 50 MCG/HR  Place 1 patch (50 mcg total) onto the skin every 3 (three) days.  5 patch  0  . LORazepam (ATIVAN) 1 MG tablet Place 1 tablet (1 mg total) under the tongue every 8 (eight) hours.  60 tablet  0  . olmesartan (BENICAR) 40 MG tablet Take 40 mg by mouth daily.      [name] oxyCODONE-acetaminophen (PERCOCET) 10-325 MG per tablet Take 1 tablet by mouth every 4 (four) hours as needed for pain.  84 tablet  0  . polyethylene glycol (MIRALAX / GLYCOLAX) packet Take 17 g by mouth daily.      [name] senna (SENOKOT) 8.6 MG TABS tablet Take 1 tablet (8.6 mg total) by mouth at bedtime. To avoid constipation.  60 each  2  . spironolactone (ALDACTONE) 25 MG tablet Take 25 mg by mouth every morning.       . tadalafil (CIALIS) 20 MG tablet Take 20 mg by mouth daily as needed for erectile dysfunction.       [name] amLODipine (NORVASC) 10 MG tablet Take 10 mg by mouth every morning.       [name] aspirin EC 81 MG tablet Take 81 mg by mouth daily. resume       Current Facility-Administered Medications  Medication Dose Route Frequency Provider Last Rate Last Dose  . topical emolient (BIAFINE) emulsion   Topical BID  Eppie Gibson, MD        Physical Findings: The patient is in no acute distress. Patient is alert and oriented.  height is 6\' 2"  (1.88 m) and weight is 302 lb 14.4 oz (137.395 kg). His temperature is 97.9 F (36.6 C). His blood pressure is 132/83 and his pulse is 64. [name] Oropharyngeal mucosa is intact with no thrush or lesions. No palpable cervical or supraclavicular lymphadenopathy. Skin intact and dry over neck.     Lab Findings:   No results found for this basename: TSH    Radiographic Findings: No results found.  Impression/Plan:    1) Head and Neck Cancer Status: healing from RT; pain lessening in throat  2) Nutritional Status: - weight:stable - PEG tube:none  3) Risk Factors: The patient has been educated about risk factors including alcohol and tobacco abuse; they understand that avoidance of alcohol and  tobacco is important to prevent recurrences as well as other cancers  4) Swallowing:good  5) Dental: edentulous  6) Energy: improved. check TSH q6-36mo  7) Social: No active social issues to address at this time  8) Other:taper Fentanyl to 47mcg, cont oxycodone prn  9) Follow-up in 1 months. The patient was encouraged to call with any issues or questions before then. PET will be at 4 mo post RT. ______   Eppie Gibson, MD

## 2013-09-30 NOTE — Progress Notes (Signed)
Mr. Melvin Hudson here today for fu s/p radiation therapy to his right tonsil and bilateral neck.  He presents with report of level 4/10 soreness of his throat, inability to sleep even with his C-PAP, and notable dry desquamation of his anterior and bilateral neck.  Continues to use to use Biafine BID and given a tube today.  His oral mucosa is moist and intact with, as he states, less thickness of saliva since the end of treatment.  Eating soft foods and meats such as rotisserie chicken, cut up sausage, and beef stew. Drinking at least 1 ensure daily. Unable taste sweet or salt and states "some food taste funny".  Maintaining weight.  Questions about when next scan will be scheduled. Has not seen Dr. [name] since the end of treatment and does not have a scheduled appointment at this time.

## 2013-10-02 ENCOUNTER — Encounter: Payer: Self-pay | Admitting: Radiation Oncology

## 2013-10-13 ENCOUNTER — Telehealth: Payer: Self-pay | Admitting: *Deleted

## 2013-10-13 NOTE — Telephone Encounter (Signed)
Patient called with request for Percocet and fentanyl Rx.  He reports he continues to take 3-4 Percocet daily for leg pain.  Dr. Isidore Moos notified.  Gayleen Orem, RN, BSN, [place] [facility] [place] [phone]

## 2013-10-17 ENCOUNTER — Other Ambulatory Visit: Payer: Self-pay | Admitting: Radiation Oncology

## 2013-10-17 ENCOUNTER — Ambulatory Visit (HOSPITAL_COMMUNITY): Payer: Medicaid - Dental | Admitting: Dentistry

## 2013-10-17 ENCOUNTER — Encounter (HOSPITAL_COMMUNITY): Payer: Self-pay | Admitting: Dentistry

## 2013-10-17 ENCOUNTER — Encounter (INDEPENDENT_AMBULATORY_CARE_PROVIDER_SITE_OTHER): Payer: Self-pay

## 2013-10-17 VITALS — BP 157/84 | HR 62 | Temp 98.2°F | Wt 303.0 lb

## 2013-10-17 DIAGNOSIS — R682 Dry mouth, unspecified: Secondary | ICD-10-CM

## 2013-10-17 DIAGNOSIS — K117 Disturbances of salivary secretion: Secondary | ICD-10-CM

## 2013-10-17 DIAGNOSIS — K08109 Complete loss of teeth, unspecified cause, unspecified class: Secondary | ICD-10-CM

## 2013-10-17 DIAGNOSIS — R1310 Dysphagia, unspecified: Secondary | ICD-10-CM

## 2013-10-17 DIAGNOSIS — Z0189 Encounter for other specified special examinations: Secondary | ICD-10-CM

## 2013-10-17 DIAGNOSIS — C099 Malignant neoplasm of tonsil, unspecified: Secondary | ICD-10-CM

## 2013-10-17 DIAGNOSIS — K08409 Partial loss of teeth, unspecified cause, unspecified class: Secondary | ICD-10-CM

## 2013-10-17 DIAGNOSIS — M264 Malocclusion, unspecified: Secondary | ICD-10-CM

## 2013-10-17 DIAGNOSIS — R432 Parageusia: Secondary | ICD-10-CM

## 2013-10-17 DIAGNOSIS — Z923 Personal history of irradiation: Secondary | ICD-10-CM

## 2013-10-17 DIAGNOSIS — K000 Anodontia: Secondary | ICD-10-CM

## 2013-10-17 MED ORDER — OXYCODONE-ACETAMINOPHEN 10-325 MG PO TABS: 1.0000 | ORAL_TABLET | ORAL | Status: DC | PRN

## 2013-10-17 MED ORDER — FENTANYL 12 MCG/HR TD PT72
12.5000 ug | MEDICATED_PATCH | TRANSDERMAL | Status: DC
Start: 2013-10-17 — End: 2013-11-28

## 2013-10-17 NOTE — Patient Instructions (Addendum)
RECOMMENDATIONS: 1. Brush tongue daily. 2. Use trismus exercises as directed. 3. Use Biotene Rinse or salt water/baking soda rinses. 4. Multiple sips of water as needed. 5. patient to follow the dentist of his choice for fabrication of upper lower complete dentures. Ideally, the patient should followup with a prosthodontist of his choice. Patient refused at this time and wishes to have dental medicine fabricated upper lower complete dentures. Patient is aware of the difficult application of the upper lower complete dentures in a class III bite. The patient is to return to clinic in two months for start of upper and lower complete dentures. Need Medicaid prior approval. 6. Bring the existing upper complete and lower partial denture to next visit. Dr. Enrique Sack

## 2013-10-17 NOTE — Progress Notes (Signed)
10/17/2013  Patient:            Melvin Hudson Date of Birth:  03-Jan-1956 MRN:                [id number]  BP 157/84  Pulse 62  Temp(Src) 98.2 F (36.8 C) (Oral)  Wt 303 lb (137.44 kg)  [name] presents for oral examination after radiation therapy. Patient has completed radiation therapy from 07/26/13 thru 09/15/13.  REVIEW OF CHIEF COMPLAINTS: DRY MOUTH: Yes HARD TO SWALLOW: Yes  HURT TO SWALLOW: Yes TASTE CHANGES: Taste is returning slowly. SORES IN MOUTH: No TRISMUS: No problems with trismus symptoms WEIGHT: 303 pounds  HOME OH REGIMEN:  BRUSHING: Brushes tongue daily. Patient is edentulous. RINSING: Using salt water baking soda rinses and Biotene rinses FLUORIDE: Not applicable TRISMUS EXERCISES:  Maximum interincisal opening: 55 mm  DENTAL EXAM:  Oral Hygiene:(PLAQUE): Edentulous. Class III malocclusion. LOCATION OF MUCOSITIS: None DESCRIPTION OF SALIVA: Moderate xerostomia. ANY EXPOSED BONE: None noted OTHER WATCHED AREAS: Previous extraction sited 22/27.  DX: Xerostomia, Dysgeusia, Dysphagia, Odynophagia and Edentulous, and Malocculsion  RECOMMENDATIONS: 1. Brush tongue daily. 2. Use trismus exercises as directed. 3. Use Biotene Rinse or salt water/baking soda rinses. 4. Multiple sips of water as needed. 5. patient to follow the dentist of his choice for fabrication of upper lower complete dentures. Ideally, the patient should followup with a prosthodontist of his choice. Patient refused at this time and wishes to have dental medicine fabricated upper lower complete dentures. Patient is aware of the difficult application of the upper lower complete dentures in a class III bite. The patient is to return to clinic in two months for start of upper and lower complete dentures. Need Medicaid prior approval. 6. Bring previous upper complete denture and lower partial denture to his next visit.  Lenn Cal, DDS

## 2013-10-28 ENCOUNTER — Telehealth: Payer: Self-pay | Admitting: *Deleted

## 2013-10-28 NOTE — Telephone Encounter (Signed)
Patient called with Rx request for oxyCODONE-acetaminophen (PERCOCET) 10-325 MG per tablet.  He reports he continues to experience neck pain, is taking 4-5 times daily.  Dr. Isidore Moos informed.  Gayleen Orem, RN, BSN, [place] at [place] [phone]

## 2013-10-29 ENCOUNTER — Emergency Department (HOSPITAL_COMMUNITY)
Admission: EM | Admit: 2013-10-29 | Discharge: 2013-10-29 | Disposition: A | Discharge status: Home / Self Care | Payer: Medicare Other | Attending: Emergency Medicine | Admitting: Emergency Medicine

## 2013-10-29 ENCOUNTER — Encounter (HOSPITAL_COMMUNITY): Payer: Self-pay | Admitting: Emergency Medicine

## 2013-10-29 DIAGNOSIS — E669 Obesity, unspecified: Secondary | ICD-10-CM | POA: Diagnosis not present

## 2013-10-29 DIAGNOSIS — Z87891 Personal history of nicotine dependence: Secondary | ICD-10-CM | POA: Diagnosis not present

## 2013-10-29 DIAGNOSIS — Z79899 Other long term (current) drug therapy: Secondary | ICD-10-CM | POA: Insufficient documentation

## 2013-10-29 DIAGNOSIS — I10 Essential (primary) hypertension: Secondary | ICD-10-CM | POA: Insufficient documentation

## 2013-10-29 DIAGNOSIS — IMO0002 Reserved for concepts with insufficient information to code with codable children: Secondary | ICD-10-CM | POA: Insufficient documentation

## 2013-10-29 DIAGNOSIS — Z87828 Personal history of other (healed) physical injury and trauma: Secondary | ICD-10-CM | POA: Diagnosis not present

## 2013-10-29 DIAGNOSIS — Z8639 Personal history of other endocrine, nutritional and metabolic disease: Secondary | ICD-10-CM | POA: Insufficient documentation

## 2013-10-29 DIAGNOSIS — Z862 Personal history of diseases of the blood and blood-forming organs and certain disorders involving the immune mechanism: Secondary | ICD-10-CM | POA: Insufficient documentation

## 2013-10-29 DIAGNOSIS — R609 Edema, unspecified: Secondary | ICD-10-CM | POA: Insufficient documentation

## 2013-10-29 DIAGNOSIS — Z7982 Long term (current) use of aspirin: Secondary | ICD-10-CM | POA: Insufficient documentation

## 2013-10-29 DIAGNOSIS — M129 Arthropathy, unspecified: Secondary | ICD-10-CM | POA: Insufficient documentation

## 2013-10-29 DIAGNOSIS — Z85819 Personal history of malignant neoplasm of unspecified site of lip, oral cavity, and pharynx: Secondary | ICD-10-CM | POA: Insufficient documentation

## 2013-10-29 LAB — BASIC METABOLIC PANEL
Anion gap: 11 (ref 5–15)
BUN: 11 mg/dL (ref 6–23)
CALCIUM: 9.3 mg/dL (ref 8.4–10.5)
CHLORIDE: 103 meq/L (ref 96–112)
CO2: 26 meq/L (ref 19–32)
CREATININE: 0.84 mg/dL (ref 0.50–1.35)
GFR calc Af Amer: >90 mL/min (ref >90)
GFR calc non Af Amer: >90 mL/min (ref >90)
Glucose, Bld: 96 mg/dL (ref 70–99)
Potassium: 3.9 mEq/L (ref 3.7–5.3)
Sodium: 140 mEq/L (ref 137–147)

## 2013-10-29 LAB — CBC WITH DIFFERENTIAL/PLATELET
Basophils Absolute: 0 10*3/uL (ref 0.0–0.1)
Basophils Relative: 0 % (ref 0–1)
EOS PCT: 6 % — AB (ref 0–5)
Eosinophils Absolute: 0.3 10*3/uL (ref 0.0–0.7)
HEMATOCRIT: 36.2 % — AB (ref 39.0–52.0)
HEMOGLOBIN: 12.4 g/dL — AB (ref 13.0–17.0)
Lymphocytes Relative: 18 % (ref 12–46)
Lymphs Abs: 1 10*3/uL (ref 0.7–4.0)
MCH: 28.3 pg (ref 26.0–34.0)
MCHC: 34.3 g/dL (ref 30.0–36.0)
MCV: 82.6 fL (ref 78.0–100.0)
MONO ABS: 0.5 10*3/uL (ref 0.1–1.0)
MONOS PCT: 9 % (ref 3–12)
NEUTROS ABS: 3.6 10*3/uL (ref 1.7–7.7)
Neutrophils Relative %: 67 % (ref 43–77)
Platelets: 211 10*3/uL (ref 150–400)
RBC: 4.38 MIL/uL (ref 4.22–5.81)
RDW: 18.3 % — AB (ref 11.5–15.5)
WBC: 5.4 10*3/uL (ref 4.0–10.5)

## 2013-10-29 MED ORDER — DIPHENHYDRAMINE HCL 25 MG PO TABS: 25.0000 mg | ORAL_TABLET | Freq: Four times a day (QID) | ORAL | Status: AC

## 2013-10-29 MED ORDER — SODIUM CHLORIDE 0.9 % IV SOLN
10.0000 mg | Freq: Once | INTRAVENOUS | Status: AC
  Administered 2013-10-29: 10 mg via INTRAVENOUS
  Filled 2013-10-29: qty 1

## 2013-10-29 MED ORDER — PREDNISONE 50 MG PO TABS: 50.0000 mg | ORAL_TABLET | Freq: Every day | ORAL | Status: DC

## 2013-10-29 MED ORDER — DIPHENHYDRAMINE HCL 50 MG/ML IJ SOLN
25.0000 mg | Freq: Once | INTRAMUSCULAR | Status: AC
  Administered 2013-10-29: 25 mg via INTRAVENOUS
  Filled 2013-10-29: qty 1

## 2013-10-29 NOTE — Discharge Instructions (Signed)

## 2013-10-29 NOTE — ED Notes (Signed)
Pt came in complaining of throat swelling however it is difficult to visualize as pt is obese. Pt denies difficulty swallowing, breathing or eating as a result of the swelling. No verbal alteration noted

## 2013-10-29 NOTE — ED Provider Notes (Signed)
CSN: [id number]     Arrival date & time 10/29/13  1457 History   First MD Initiated Contact with Patient 10/29/13 1531     Chief Complaint  Patient presents with  . Edema    Neck    HPI The patient presents to the emergency room with complaints of swelling in the submandibular region. It is not painful or pruritic. Patient states he noticed it a little bit on Friday. When he woke up this morning it was more severe. He has not noticed any swelling in his contour his lips. He is not having any difficulty breathing or swallowing. Patient does have a history of throat cancer and was concerned about this new swelling. He was diagnosed with squamous cell carcinoma from of the right tonsil.  He completed his last radiation therapy the end of July of this year. He has not been having an issue since that time.  Past Medical History  Diagnosis Date  . Hypertension   . Angioedema 09/17/11    tongue and lips  . Arthritis     "both of my legs and feet"  . Obesity   . Tonsil cancer 06/14/13    Squamous cell carcinoma right tonsil  . Hx of gout   . Sleep apnea     USES C PAP  . S/P radiation therapy 07/26/2013-09/15/2013    Right tonsil/bilateral neck/ 7000 cGy   Past Surgical History  Procedure Laterality Date  . Knee arthroscopy  ~ 1994    right  . Multiple tooth extractions  ~ 2008  . Multiple extractions with alveoloplasty N/A 07/08/2013    Procedure: Extraction of tooth #'s 22, 27 with alveoloplasty and bilateal mandibular facial exostoses reductions;  Surgeon: Lenn Cal, DDS;  Location: [facility];  Service: Oral Surgery;  Laterality: N/A;   Family History  Problem Relation Age of Onset  . Cancer Sister     breast ca   History  Substance Use Topics  . Smoking status: Former Smoker -- 40 years    Types: Cigars    Quit date: 06/28/2004  . Smokeless tobacco: Never Used     Comment: Used to smoke cigs QUIT x 9 years ago.   . 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     Allergies  Advil and Azilsartan  Home Medications   Prior to Admission medications   Medication Sig Start Date End Date Taking? Authorizing Provider  Alum & Mag Hydroxide-Simeth (MAGIC MOUTHWASH W/LIDOCAINE) SOLN 1 part 2% viscous lidocaine, 1 part water. Swish, gargle, and/or swallow 4mL QID, prn sore mouth/throat. Use 50min before meals & bedtime. 08/08/13  Yes Eppie Gibson, MD  aspirin EC 81 MG tablet Take 81 mg by mouth daily. resume   Yes Historical Provider, MD  Cholecalciferol (VITAMIN D-3 PO) Take 1,000 Units by mouth daily.   Yes Historical Provider, MD  emollient (BIAFINE) cream Apply topically 2 (two) times daily.   Yes Historical Provider, MD  fentaNYL (DURAGESIC - DOSED MCG/HR) 12 MCG/HR Place 1 patch (12.5 mcg total) onto the skin every 3 (three) days. 10/17/13  Yes Eppie Gibson, MD  olmesartan (BENICAR) 40 MG tablet Take 40 mg by mouth daily. 09/19/13  Yes Kristine Garbe, MD  oxyCODONE-acetaminophen (PERCOCET) 10-325 MG per tablet Take 1 tablet by mouth every 4 (four) hours as needed for pain. 10/17/13  Yes Eppie Gibson, MD  polyethylene  glycol (MIRALAX / GLYCOLAX) packet Take 17 g by mouth daily.   Yes Historical Provider, MD  spironolactone (ALDACTONE) 25 MG tablet Take 25 mg by mouth every morning.  09/18/11  Yes Debbe Odea, MD  tadalafil (CIALIS) 20 MG tablet Take 20 mg by mouth daily as needed for erectile dysfunction.    Yes Historical Provider, MD  diphenhydrAMINE (BENADRYL) 25 MG tablet Take 1 tablet (25 mg total) by mouth every 6 (six) hours. 10/29/13   Dorie Rank, MD  predniSONE (DELTASONE) 50 MG tablet Take 1 tablet (50 mg total) by mouth daily. 10/29/13   Dorie Rank, MD   BP 173/93  Pulse 76  Temp(Src) 99.2 F (37.3 C) (Oral)  Resp 18  SpO2 98% Physical Exam  Nursing note and vitals reviewed. Constitutional: No distress.  Obese  HENT:  Head: Normocephalic and atraumatic.  Right Ear:  External ear normal.  Left Ear: External ear normal.  Mouth/Throat: Mucous membranes are not pale, not dry and not cyanotic. No oral lesions. No trismus in the jaw. No uvula swelling. Posterior oropharyngeal erythema present. No oropharyngeal exudate, posterior oropharyngeal edema or tonsillar abscesses.  Eyes: Conjunctivae are normal. Right eye exhibits no discharge. Left eye exhibits no discharge. No scleral icterus.  Neck: Neck supple. No tracheal deviation present.  Abundant in the submandibular region, soft not indurated no masses  Cardiovascular: Normal rate, regular rhythm and intact distal pulses.   Pulmonary/Chest: Effort normal and breath sounds normal. No stridor. No respiratory distress. He has no wheezes. He has no rales.  Speaking in full sentences without difficulty, no stridor or wheezing  Abdominal: Soft. Bowel sounds are normal. He exhibits no distension. There is no tenderness. There is no rebound and no guarding.  Musculoskeletal: He exhibits no edema and no tenderness.  Neurological: He is alert. He has normal strength. No cranial nerve deficit (no facial droop, extraocular movements intact, no slurred speech) or sensory deficit. He exhibits normal muscle tone. He displays no seizure activity. Coordination normal.  Skin: Skin is warm and dry. No rash noted.  Psychiatric: He has a normal mood and affect.    ED Course  Procedures (including critical care time) 1705  Pt feels like his symptoms are improving.  Still denies trouble with swallowing, breathing or speaking.  Labs Review Labs Reviewed  CBC WITH DIFFERENTIAL - Abnormal; Notable for the following:    Hemoglobin 12.4 (*)    HCT 36.2 (*)    RDW 18.3 (*)    Eosinophils Relative 6 (*)    All other components within normal limits  BASIC METABOLIC PANEL      MDM   Final diagnoses:  Edema    Pt's neck area is soft and without induration or erythema.  Mostly suggestive of adipose tissue.  His appearance  suggests some edema in the lips and tongue however the patient denies any swelling in this area and he states it is normal for him.  Will dc home on short course of steroids and antihistamines.  Follow up with pcp/ent/oncologist on Monday.  Return for worsening symptoms    Dorie Rank, MD 10/29/13 1710

## 2013-10-29 NOTE — ED Notes (Signed)
Pt reports neck swelling that started on Friday. Pt reports that he has throat cancer, which he just completed radiation. Pt is talking in full sentences and has an oxygen saturation of 98% on room air. Pt reports increased urinary frequency. Pt is A/O x4, ambulatory to triage, and in NAD.

## 2013-10-31 ENCOUNTER — Other Ambulatory Visit: Payer: Self-pay | Admitting: Radiation Oncology

## 2013-10-31 DIAGNOSIS — C099 Malignant neoplasm of tonsil, unspecified: Secondary | ICD-10-CM

## 2013-10-31 MED ORDER — OXYCODONE-ACETAMINOPHEN 10-325 MG PO TABS: 1.0000 | ORAL_TABLET | ORAL | Status: DC | PRN

## 2013-11-11 ENCOUNTER — Encounter: Payer: Self-pay | Admitting: Radiation Oncology

## 2013-11-11 ENCOUNTER — Ambulatory Visit
Admission: RE | Admit: 2013-11-11 | Discharge: 2013-11-11 | Disposition: A | Discharge status: Home / Self Care | Payer: Medicare Other | Source: Ambulatory Visit | Attending: Radiation Oncology | Admitting: Radiation Oncology

## 2013-11-11 VITALS — BP 169/98 | HR 72 | Temp 98.1°F | Resp 18 | Wt 309.5 lb

## 2013-11-11 DIAGNOSIS — R635 Abnormal weight gain: Secondary | ICD-10-CM

## 2013-11-11 DIAGNOSIS — C099 Malignant neoplasm of tonsil, unspecified: Secondary | ICD-10-CM

## 2013-11-11 MED ORDER — OXYCODONE-ACETAMINOPHEN 10-325 MG PO TABS
1.0000 | ORAL_TABLET | Freq: Four times a day (QID) | ORAL | Status: DC | PRN
Start: 2013-11-11 — End: 2023-04-20

## 2013-11-11 NOTE — Progress Notes (Signed)
He rates his pain as a 6 on a scale of 0-10. Pt complains of having swelling over the throat/neck area.  Reporting he went to the ER on 9/12 due to the swelling dx it as edema.  He was put on steriods which helped the swelling.  His PCP took him off the steroid.    Pt presenting appropriate quality, quantity and organization of sentences, garbled. Pt denies dysphagia. The patient eats a regular, healthy diet.. Oral exam reveals mucous membranes moist, pharynx normal without lesions and exudate noted. Skin is warm dry and intact.

## 2013-11-11 NOTE — Progress Notes (Signed)
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 Rates his pain as a 6 on a scale of 0-10. He complains of having swelling over the throat/neck area. Reporting he went to the ER on 9/12 due to the swelling; dx it as edema. He was put on steriods which helped the swelling. His PCP took him off the steroid.   Pt denies dysphagia. Has a dry mouth. The patient eats a regular, healthy diet.. Weight increased by 3 kg since 8-31.                         ALLERGIES:  is allergic to advil and azilsartan.  Meds: Current Outpatient Prescriptions  Medication Sig Dispense Refill  . Alum & Mag Hydroxide-Simeth (MAGIC MOUTHWASH W/LIDOCAINE) SOLN 1 part 2% viscous lidocaine, 1 part water. Swish, gargle, and/or swallow 36mL QID, prn sore mouth/throat. Use 71min before meals & bedtime.  480 mL  5  . aspirin EC 81 MG tablet Take 81 mg by mouth daily. resume      . Cholecalciferol (VITAMIN D-3 PO) Take 1,000 Units by mouth daily.      . diphenhydrAMINE (BENADRYL) 25 MG tablet Take 1 tablet (25 mg total) by mouth every 6 (six) hours.  20 tablet  0  . emollient (BIAFINE) cream Apply topically 2 (two) times daily.      . fentaNYL (DURAGESIC - DOSED MCG/HR) 12 MCG/HR Place 1 patch (12.5 mcg total) onto the skin every 3 (three) days.  5 patch  0  . olmesartan (BENICAR) 40 MG tablet Take 40 mg by mouth daily.      [name]  oxyCODONE-acetaminophen (PERCOCET) 10-325 MG per tablet Take 1 tablet by mouth every 4 (four) hours as needed for pain.  84 tablet  0  . polyethylene glycol (MIRALAX / GLYCOLAX) packet Take 17 g by mouth daily.      . predniSONE (DELTASONE) 50 MG tablet Take 1 tablet (50 mg total) by mouth daily.  5 tablet  0  . spironolactone (ALDACTONE) 25 MG tablet Take 25 mg by mouth every morning.       . tadalafil (CIALIS) 20 MG tablet Take 20 mg by mouth daily as needed for erectile dysfunction.        No current facility-administered medications for this encounter.    Physical Findings: The patient is in no acute distress. Patient is alert and oriented.  weight is 309 lb 8 oz (140.388 kg). His oral temperature is 98.1 F (36.7 C). His blood pressure is 169/98 and his pulse is 72. His respiration is 18 and oxygen saturation is 99%. . Oropharyngeal mucosa is intact with no thrush or lesions. Right posterior tongue appearing a bit tethered to tonsillar fossa. No palpable cervical or supraclavicular lymphadenopathy. Skin intact and dry over neck.   + lymphedema in anterior neck   Lab Findings: Lab Results  Component Value  Date   WBC 5.4 10/29/2013   HGB 12.4* 10/29/2013   HCT 36.2* 10/29/2013   MCV 82.6 10/29/2013   PLT 211 10/29/2013    No results found for this basename: TSH    Radiographic Findings: No results found.  Impression/Plan:    1) Head and Neck Cancer Status: healing from RT  2) Nutritional Status:no issues, no PEG  3) Risk Factors: The patient has been educated about risk factors including alcohol and tobacco abuse; they understand that avoidance of alcohol and tobacco is important to prevent recurrences as well as other cancers  4) Swallowing: no issues  5) Dental: Encouraged to continue regular followup with dentistry, and dental hygiene including fluoride rinses.   6) Energy: check TSH at next visit  7) Social: No active social issues to address at this time  8) Other:  PT for lymphedema in neck;   biotene gel for xerostomia;  1 mo supply of oxycodone given today.  He is back at baseline doses for non cancer pain : Percocet 10-325 q6hr PRN. Further pain meds to resume with pain specialist thereafter. I will ask Gayleen Orem, RN, our Head and Neck Oncology Navigator to contact [facility] in [place] [phone]) to inform them. He has been seen by 2 different MDs - Mirna Mires and [place].  9) Follow-up in 2 mo with PET and TSH. The patient was encouraged to call with any issues or questions before then.   _____________________________________   Eppie Gibson, MD

## 2013-11-14 ENCOUNTER — Telehealth: Payer: Self-pay | Admitting: *Deleted

## 2013-11-14 NOTE — Telephone Encounter (Signed)
Spoke with Dr. Mirna Mires at [facility], informed him that Dr. Isidore Moos, per her f/u visit with the patient last week Friday, 1) is referring patient back to his clinic for mgt of his back pain now that his pain med dose has returned to baseline for non-cancer pain, 2) wrote patient an Rx for 30-d supply of Percocet10-325 q6hr PRN.  He verbalized understanding, indicate his office will arrange a f/u appt with patient.  Gayleen Orem, RN, BSN, [place] at [place] [phone]

## 2013-11-14 NOTE — Telephone Encounter (Signed)
CALLED PATIENT TO INFORM OF PT, TEST, LAB AND FU VISIT, SPOKE WITH PATIENT AND HE IS AWARE OF THESE APPTS.

## 2013-11-16 ENCOUNTER — Ambulatory Visit: Payer: Medicare Other | Attending: Radiation Oncology | Admitting: Physical Therapy

## 2013-11-16 DIAGNOSIS — IMO0001 Reserved for inherently not codable concepts without codable children: Secondary | ICD-10-CM | POA: Diagnosis not present

## 2013-11-16 DIAGNOSIS — I890 Lymphedema, not elsewhere classified: Secondary | ICD-10-CM | POA: Diagnosis not present

## 2013-11-22 ENCOUNTER — Ambulatory Visit: Payer: Medicare Other | Attending: Radiation Oncology | Admitting: Physical Therapy

## 2013-11-22 DIAGNOSIS — I890 Lymphedema, not elsewhere classified: Secondary | ICD-10-CM | POA: Insufficient documentation

## 2013-11-22 DIAGNOSIS — Z5189 Encounter for other specified aftercare: Secondary | ICD-10-CM | POA: Insufficient documentation

## 2013-11-23 ENCOUNTER — Ambulatory Visit: Payer: Medicare Other | Admitting: Physical Therapy

## 2013-11-23 DIAGNOSIS — Z5189 Encounter for other specified aftercare: Secondary | ICD-10-CM | POA: Diagnosis not present

## 2013-11-28 ENCOUNTER — Encounter: Payer: Self-pay | Admitting: Pulmonary Disease

## 2013-11-28 ENCOUNTER — Ambulatory Visit (INDEPENDENT_AMBULATORY_CARE_PROVIDER_SITE_OTHER): Payer: Medicare Other | Admitting: Pulmonary Disease

## 2013-11-28 ENCOUNTER — Ambulatory Visit: Payer: Medicare Other | Admitting: Physical Therapy

## 2013-11-28 VITALS — BP 132/76 | HR 64 | Temp 97.0°F | Ht 73.0 in | Wt 310.8 lb

## 2013-11-28 DIAGNOSIS — G4733 Obstructive sleep apnea (adult) (pediatric): Secondary | ICD-10-CM

## 2013-11-28 DIAGNOSIS — Z5189 Encounter for other specified aftercare: Secondary | ICD-10-CM | POA: Diagnosis not present

## 2013-11-28 NOTE — Patient Instructions (Signed)
Stay on cpap, and keep up with mask cushion changes and supplies. Keep working on weight loss followup with me again in one year.

## 2013-11-28 NOTE — Progress Notes (Signed)
   Subjective:    Patient ID: Melvin Hudson, male    DOB: April 02, 1955, 58 y.o.   MRN: [id number]  HPI The patient comes in today for followup of his obstructive sleep apnea.  He is currently wearing CPAP regularly, and is having no issues with his mask fit or pressure.  He is satisfied with his sleep and daytime alertness. He has continued to lose weight since the last visit, although he was recently diagnosed with throat cancer requiring radiation.   Review of Systems  Constitutional: Negative for fever and unexpected weight change.  HENT: Negative for congestion, dental problem, ear pain, nosebleeds, postnasal drip, rhinorrhea, sinus pressure, sneezing, sore throat and trouble swallowing.   Eyes: Negative for redness and itching.  Respiratory: Negative for cough, chest tightness, shortness of breath and wheezing.   Cardiovascular: Negative for palpitations and leg swelling.  Gastrointestinal: Negative for nausea and vomiting.  Genitourinary: Negative for dysuria.  Musculoskeletal: Negative for joint swelling.  Skin: Negative for rash.  Neurological: Negative for headaches.  Hematological: Does not bruise/bleed easily.  Psychiatric/Behavioral: Negative for dysphoric mood. The patient is not nervous/anxious.        Objective:   Physical Exam Overweight male in no acute distress Nose without purulence or discharge noted No skin breakdown or pressure necrosis from the CPAP mask Neck without lymphadenopathy or thyromegaly Lower extremities with edema noted, no cyanosis Alert and oriented, moves all 4 extremities.       Assessment & Plan:

## 2013-11-28 NOTE — Assessment & Plan Note (Signed)
The patient is doing extremely well with CPAP on his current settings, and is satisfied with his sleep and daytime alertness. He has been slowly losing weight over time, and I have asked him to continue working on this. I've also asked him to keep up with his mask changes and supplies. I will see him back in one year if doing well.

## 2013-11-30 ENCOUNTER — Ambulatory Visit: Payer: Medicare Other | Admitting: Physical Therapy

## 2013-12-07 ENCOUNTER — Ambulatory Visit: Payer: Medicare Other

## 2013-12-07 DIAGNOSIS — Z5189 Encounter for other specified aftercare: Secondary | ICD-10-CM | POA: Diagnosis not present

## 2013-12-09 ENCOUNTER — Ambulatory Visit: Payer: Medicare Other | Admitting: Physical Therapy

## 2013-12-09 DIAGNOSIS — Z5189 Encounter for other specified aftercare: Secondary | ICD-10-CM | POA: Diagnosis not present

## 2013-12-12 ENCOUNTER — Ambulatory Visit: Payer: Medicare Other

## 2013-12-12 DIAGNOSIS — Z5189 Encounter for other specified aftercare: Secondary | ICD-10-CM | POA: Diagnosis not present

## 2013-12-13 ENCOUNTER — Ambulatory Visit: Payer: Medicare Other

## 2013-12-14 ENCOUNTER — Ambulatory Visit: Payer: Medicare Other

## 2013-12-14 DIAGNOSIS — Z5189 Encounter for other specified aftercare: Secondary | ICD-10-CM | POA: Diagnosis not present

## 2013-12-19 ENCOUNTER — Encounter (HOSPITAL_COMMUNITY): Payer: Self-pay | Admitting: Dentistry

## 2013-12-20 ENCOUNTER — Ambulatory Visit: Payer: Medicare Other | Attending: Radiation Oncology

## 2013-12-20 ENCOUNTER — Encounter (HOSPITAL_COMMUNITY): Payer: Self-pay | Admitting: Dentistry

## 2013-12-20 ENCOUNTER — Ambulatory Visit (HOSPITAL_COMMUNITY): Payer: Self-pay | Admitting: Dentistry

## 2013-12-20 VITALS — BP 132/84 | HR 76

## 2013-12-20 DIAGNOSIS — I890 Lymphedema, not elsewhere classified: Secondary | ICD-10-CM

## 2013-12-20 DIAGNOSIS — K08109 Complete loss of teeth, unspecified cause, unspecified class: Secondary | ICD-10-CM

## 2013-12-20 DIAGNOSIS — Z5189 Encounter for other specified aftercare: Secondary | ICD-10-CM | POA: Diagnosis present

## 2013-12-20 NOTE — Progress Notes (Signed)
12/20/2013  Patient Name:   Melvin Hudson Date of Birth:   05-Jan-1956 Medical Record Number: [id number]  BP 132/84 mmHg  Pulse 76  [name] presents for start of upper and lower denture fabrication.  Exam: Patient is edentulous. Discussed procedures involved in upper and lower denture fabrication and prognosis for successful ability to wear dentures. Price for dentures confirmed.  Medicaid prior approval was obtained. Patient agrees to proceed with upper and lower denture fabrication. Procedure:  Upper and lower denture primary impressions in alginate. Lab pour. To [facility] for upper and lower denture custom tray fabrication. RTC for upper and lower denture final impressions.  Lenn Cal, DDS

## 2013-12-20 NOTE — Patient Instructions (Signed)
Return to clinic as scheduled for upper and lower complete denture fabrication.  Dr. Mayline Dragon 

## 2013-12-20 NOTE — Therapy (Signed)
Physical Therapy Treatment  Patient Details  Name: Melvin Hudson MRN: [id number] Date of Birth: Mar 19, 1955  Encounter Date: 12/20/2013      PT End of Session - 12/20/13 1148    Visit Number 9   Number of Visits 10   PT Start Time 1102   PT Stop Time 1147   PT Time Calculation (min) 45 min      Past Medical History  Diagnosis Date  . Hypertension   . Angioedema 09/17/11    tongue and lips  . Arthritis     "both of my legs and feet"  . Obesity   . Tonsil cancer 06/14/13    Squamous cell carcinoma right tonsil  . Hx of gout   . Sleep apnea     USES C PAP  . S/P radiation therapy 07/26/2013-09/15/2013    Right tonsil/bilateral neck/ 7000 cGy    Past Surgical History  Procedure Laterality Date  . Knee arthroscopy  ~ 1994    right  . Multiple tooth extractions  ~ 2008  . Multiple extractions with alveoloplasty N/A 07/08/2013    Procedure: Extraction of tooth #'s 22, 27 with alveoloplasty and bilateal mandibular facial exostoses reductions;  Surgeon: Melvin Hudson, DDS;  Location: [facility];  Service: Oral Surgery;  Laterality: N/A;    There were no vitals taken for this visit.  Visit Diagnosis:  Lymphedema      Subjective Assessment - 12/20/13 1152    Symptoms "Went to see the doctor about swallowing difficulties on Friday and he reported Im still healing from radiation and new set back must have been from me eating somethingthat flared up tissue that was healing." Overall feel like the swelling continues to improve.    Currently in Pain? No/denies      Manual lymph drainage to head/neck: Short and long neck, superficial and deep abdominals; bil shoulder collectors, bil axillae and pectoral nodes and anterior bil upper quadrants; anterior throat, submental and submandibular nodes, upper lip, bil masseters, pre- and retroauricular nodes, suboccipital nodes redirecting towards axillae.               Plan - 12/20/13 1154    Clinical Impression Statement Patient  continues to show improvement with overall reduction of fluid, though is not consistent with self manual lymph drainage, and does continue to have lymphedema at neck.    PT Plan See renewal.         Problem List Patient Active Problem List   Diagnosis Date Noted  . Mouth pain 07/18/2013  . Weight loss 07/18/2013  . Post-operative state 07/08/2013  . Post-op pain 07/08/2013  . Malignant neoplasm of tonsil 06/29/2013  . OSA (obstructive sleep apnea) 11/19/2011  . Angio-edema 09/17/2011  . hypotension  09/17/2011  . HTN (hypertension) 09/17/2011  . Gout 09/17/2011  . Tobacco abuse 09/17/2011                                        Short Term Clinic Goals - 12/20/13 1157    Title short term goals=long term goals          [facility] Goals - 12/20/13 1158    Title Patient will be able to verbalize good understanding of the Maintenance Phase of treatment including manual lymph drainage, use of compression, and lymphedema risk reduction practices.    Time 4   Period Weeks   Status  On-going   Title Patient will be able to reduce edema by 1.5 cm at 8 cm proximal to sternal notch.    Time 4   Period Weeks   Status On-going   Title Patient will be able to be independent with a home exercise program   Time 4   Period Weeks   Status On-going   Title Patient will be able to report overall pain decreased >/= 50% to tolerate daily tasks with less pain.    Time 4   Period Weeks   Status On-going         Melvin Hudson, [place] 12/20/2013 12:16 PM  Melvin Hudson 12/20/2013, 12:16 PM

## 2013-12-22 NOTE — Addendum Note (Signed)
Addended by: Kipp Laurence on: 12/22/2013 02:00 PM   Modules accepted: Orders

## 2013-12-27 ENCOUNTER — Encounter (HOSPITAL_COMMUNITY): Payer: Self-pay | Admitting: Dentistry

## 2013-12-27 ENCOUNTER — Ambulatory Visit (HOSPITAL_COMMUNITY): Payer: Medicaid - Dental | Admitting: Dentistry

## 2013-12-27 ENCOUNTER — Encounter (INDEPENDENT_AMBULATORY_CARE_PROVIDER_SITE_OTHER): Payer: Self-pay

## 2013-12-27 VITALS — BP 135/86 | HR 78 | Temp 98.2°F

## 2013-12-27 DIAGNOSIS — Z923 Personal history of irradiation: Secondary | ICD-10-CM

## 2013-12-27 DIAGNOSIS — K000 Anodontia: Principal | ICD-10-CM

## 2013-12-27 DIAGNOSIS — R682 Dry mouth, unspecified: Secondary | ICD-10-CM

## 2013-12-27 DIAGNOSIS — K117 Disturbances of salivary secretion: Secondary | ICD-10-CM

## 2013-12-27 DIAGNOSIS — K08109 Complete loss of teeth, unspecified cause, unspecified class: Secondary | ICD-10-CM

## 2013-12-27 DIAGNOSIS — Z463 Encounter for fitting and adjustment of dental prosthetic device: Secondary | ICD-10-CM

## 2013-12-27 NOTE — Progress Notes (Addendum)
12/27/2013  Patient Name:   Melvin Hudson Date of Birth:   1955-07-01 Medical Record Number: [id number]  BP 135/86 mmHg  Pulse 78  Temp(Src) 98.2 F (36.8 C) (Oral)  [name] presents for continued upper and lower complete denture fabrication.  Procedure:  Upper border molding completed. I then started lower complete denture final tray adjustment. During that time patient complained of some irritation to the lower right lingual area. Further examination revealed delayed healing from previous radiation therapy. Decision was made to discontinue denture fabrication at this time. I then contacted Dr. Constance Holster and Dr. Isidore Moos concerning my findings. They will re-evaluate area and then discuss possible treatment options. Patient will benefit from increased healing time before further denture fabrication. I will contact patient to reschedule appointment as indicated.  Lenn Cal, DDS

## 2013-12-27 NOTE — Patient Instructions (Addendum)
Patient is to follow up with Dr. Constance Holster for evaluation of the lower right lingual/soft palate area. Denture fabrication will be discontinued until healing in this area Is further evaluated. Dr. Enrique Sack

## 2013-12-28 ENCOUNTER — Encounter (HOSPITAL_COMMUNITY): Payer: Self-pay | Admitting: Dentistry

## 2014-01-03 ENCOUNTER — Encounter (HOSPITAL_COMMUNITY)
Admission: RE | Admit: 2014-01-03 | Discharge: 2014-01-03 | Disposition: A | Discharge status: Home / Self Care | Payer: Medicare Other | Source: Ambulatory Visit | Attending: Otolaryngology | Admitting: Otolaryngology

## 2014-01-03 ENCOUNTER — Encounter (HOSPITAL_COMMUNITY): Payer: Self-pay

## 2014-01-03 DIAGNOSIS — Z886 Allergy status to analgesic agent status: Secondary | ICD-10-CM | POA: Diagnosis not present

## 2014-01-03 DIAGNOSIS — K1370 Unspecified lesions of oral mucosa: Secondary | ICD-10-CM | POA: Diagnosis not present

## 2014-01-03 DIAGNOSIS — Z79899 Other long term (current) drug therapy: Secondary | ICD-10-CM | POA: Diagnosis not present

## 2014-01-03 DIAGNOSIS — G4730 Sleep apnea, unspecified: Secondary | ICD-10-CM | POA: Diagnosis not present

## 2014-01-03 DIAGNOSIS — Z85818 Personal history of malignant neoplasm of other sites of lip, oral cavity, and pharynx: Secondary | ICD-10-CM | POA: Diagnosis not present

## 2014-01-03 DIAGNOSIS — Z923 Personal history of irradiation: Secondary | ICD-10-CM | POA: Diagnosis not present

## 2014-01-03 DIAGNOSIS — Z7982 Long term (current) use of aspirin: Secondary | ICD-10-CM | POA: Diagnosis not present

## 2014-01-03 DIAGNOSIS — I10 Essential (primary) hypertension: Secondary | ICD-10-CM | POA: Diagnosis not present

## 2014-01-03 DIAGNOSIS — Z888 Allergy status to other drugs, medicaments and biological substances status: Secondary | ICD-10-CM | POA: Diagnosis not present

## 2014-01-03 DIAGNOSIS — Z87891 Personal history of nicotine dependence: Secondary | ICD-10-CM | POA: Diagnosis not present

## 2014-01-03 DIAGNOSIS — M109 Gout, unspecified: Secondary | ICD-10-CM | POA: Diagnosis not present

## 2014-01-03 LAB — BASIC METABOLIC PANEL
Anion gap: 14 (ref 5–15)
BUN: 10 mg/dL (ref 6–23)
CO2: 25 mEq/L (ref 19–32)
Calcium: 9.6 mg/dL (ref 8.4–10.5)
Chloride: 102 mEq/L (ref 96–112)
Creatinine, Ser: 1.11 mg/dL (ref 0.50–1.35)
GFR, EST AFRICAN AMERICAN: 82 mL/min — AB (ref >90)
GFR, EST NON AFRICAN AMERICAN: 71 mL/min — AB (ref >90)
GLUCOSE: 88 mg/dL (ref 70–99)
Potassium: 4.5 mEq/L (ref 3.7–5.3)
Sodium: 141 mEq/L (ref 137–147)

## 2014-01-03 LAB — CBC
HCT: 35.3 % — ABNORMAL LOW (ref 39.0–52.0)
Hemoglobin: 12.4 g/dL — ABNORMAL LOW (ref 13.0–17.0)
MCH: 28.6 pg (ref 26.0–34.0)
MCHC: 35.1 g/dL (ref 30.0–36.0)
MCV: 81.3 fL (ref 78.0–100.0)
PLATELETS: 274 10*3/uL (ref 150–400)
RBC: 4.34 MIL/uL (ref 4.22–5.81)
RDW: 17.3 % — ABNORMAL HIGH (ref 11.5–15.5)
WBC: 4.9 10*3/uL (ref 4.0–10.5)

## 2014-01-03 NOTE — Pre-Procedure Instructions (Signed)
Melvin Hudson  01/03/2014   Your procedure is scheduled on:  Thursday November 19 th at 1200 PM  Report to [facility] Admitting at 100 AM.  Call this number if you have problems the morning of surgery: [phone]   Remember:   Do not eat food or drink liquids after midnight Wednesday 01/04/14.    Take these medicines the morning of surgery with A SIP OF WATER: Oxycodone-acetaminophen if needed for pain.  Stop Aspirin, Vitamins, Herbal medications, and Nsaids 5 days prior to surgery.    Do not wear jewelry.  Do not wear lotions, powders, or colognes. You may wear deodorant.             Men may shave face and neck.  Do not bring valuables to the hospital.  [facility] is not responsible for any belongings or valuables.               Contacts, dentures or bridgework may not be worn into surgery.  Leave suitcase in the car. After surgery it may be brought to your room.  For patients admitted to the hospital, discharge time is determined by your  treatment team.               Patients discharged the day of surgery will not be allowed to drive home.    Special Instructions: [place] - Preparing for Surgery  Before surgery, you can play an important role.  Because skin is not sterile, your skin needs to be as free of germs as possible.  You can reduce the number of germs on you skin by washing with CHG (chlorahexidine gluconate) soap before surgery.  CHG is an antiseptic cleaner which kills germs and bonds with the skin to continue killing germs even after washing.  Please DO NOT use if you have an allergy to CHG or antibacterial soaps.  If your skin becomes reddened/irritated stop using the CHG and inform your nurse when you arrive at Short Stay.  Do not shave (including legs and underarms) for at least 48 hours prior to the first CHG shower.  You may shave your face.  Please follow these instructions carefully:   1.  Shower with CHG Soap the night before surgery and  the                                morning of Surgery.  2.  If you choose to wash your hair, wash your hair first as usual with your       normal shampoo.  3.  After you shampoo, rinse your hair and body thoroughly to remove the                      Shampoo.  4.  Use CHG as you would any other liquid soap.  You can apply chg directly       to the skin and wash gently with scrungie or a clean washcloth.  5.  Apply the CHG Soap to your body ONLY FROM THE NECK DOWN.        Do not use on open wounds or open sores.  Avoid contact with your eyes,       ears, mouth and genitals (private parts).  Wash genitals (private parts)       with your normal soap.  6.  Wash thoroughly, paying special attention to the area where your surgery  will be performed.  7.  Thoroughly rinse your body with warm water from the neck down.  8.  DO NOT shower/wash with your normal soap after using and rinsing off       the CHG Soap.  9.  Pat yourself dry with a clean towel.            10.  Wear clean pajamas.            11.  Place clean sheets on your bed the night of your first shower and do not        sleep with pets.  Day of Surgery  Do not apply any lotions/deoderants the morning of surgery.  Please wear clean clothes to the hospital/surgery center.      Please read over the following fact sheets that you were given: Pain Booklet, Coughing and Deep Breathing and Surgical Site Infection Prevention

## 2014-01-05 ENCOUNTER — Encounter (HOSPITAL_COMMUNITY)
Admission: RE | Disposition: A | Discharge status: Home / Self Care | Payer: Self-pay | Source: Ambulatory Visit | Attending: Otolaryngology

## 2014-01-05 ENCOUNTER — Encounter (HOSPITAL_COMMUNITY): Payer: Self-pay | Admitting: *Deleted

## 2014-01-05 ENCOUNTER — Ambulatory Visit (HOSPITAL_COMMUNITY): Payer: Medicare Other | Admitting: Certified Registered Nurse Anesthetist

## 2014-01-05 ENCOUNTER — Observation Stay (HOSPITAL_COMMUNITY)
Admission: RE | Admit: 2014-01-05 | Discharge: 2014-01-06 | Disposition: A | Discharge status: Home / Self Care | Payer: Medicare Other | Source: Ambulatory Visit | Attending: Otolaryngology | Admitting: Otolaryngology

## 2014-01-05 DIAGNOSIS — M109 Gout, unspecified: Secondary | ICD-10-CM | POA: Insufficient documentation

## 2014-01-05 DIAGNOSIS — Z7982 Long term (current) use of aspirin: Secondary | ICD-10-CM | POA: Insufficient documentation

## 2014-01-05 DIAGNOSIS — G4730 Sleep apnea, unspecified: Secondary | ICD-10-CM | POA: Insufficient documentation

## 2014-01-05 DIAGNOSIS — Z923 Personal history of irradiation: Secondary | ICD-10-CM | POA: Insufficient documentation

## 2014-01-05 DIAGNOSIS — Z888 Allergy status to other drugs, medicaments and biological substances status: Secondary | ICD-10-CM | POA: Insufficient documentation

## 2014-01-05 DIAGNOSIS — K1370 Unspecified lesions of oral mucosa: Secondary | ICD-10-CM | POA: Diagnosis not present

## 2014-01-05 DIAGNOSIS — Z87891 Personal history of nicotine dependence: Secondary | ICD-10-CM | POA: Insufficient documentation

## 2014-01-05 DIAGNOSIS — Z886 Allergy status to analgesic agent status: Secondary | ICD-10-CM | POA: Insufficient documentation

## 2014-01-05 DIAGNOSIS — Z85818 Personal history of malignant neoplasm of other sites of lip, oral cavity, and pharynx: Secondary | ICD-10-CM | POA: Insufficient documentation

## 2014-01-05 DIAGNOSIS — I10 Essential (primary) hypertension: Secondary | ICD-10-CM | POA: Insufficient documentation

## 2014-01-05 DIAGNOSIS — Z79899 Other long term (current) drug therapy: Secondary | ICD-10-CM | POA: Insufficient documentation

## 2014-01-05 DIAGNOSIS — C140 Malignant neoplasm of pharynx, unspecified: Secondary | ICD-10-CM | POA: Diagnosis present

## 2014-01-05 HISTORY — DX: Obstructive sleep apnea (adult) (pediatric): G47.33

## 2014-01-05 HISTORY — DX: Malignant neoplasm of tonsil, unspecified: C09.9

## 2014-01-05 HISTORY — PX: DIRECT LARYNGOSCOPY: SHX5326

## 2014-01-05 HISTORY — DX: Dependence on other enabling machines and devices: Z99.89

## 2014-01-05 SURGERY — LARYNGOSCOPY, DIRECT
Anesthesia: General | Site: Mouth

## 2014-01-05 MED ORDER — PROPOFOL 10 MG/ML IV EMUL
INTRAVENOUS | Status: AC
  Filled 2014-01-05: qty 100

## 2014-01-05 MED ORDER — CLINDAMYCIN PALMITATE HCL 75 MG/5ML PO SOLR: 300.0000 mg | Freq: Four times a day (QID) | ORAL | Status: DC

## 2014-01-05 MED ORDER — OXYCODONE-ACETAMINOPHEN 5-325 MG PO TABS
1.0000 | ORAL_TABLET | Freq: Four times a day (QID) | ORAL | Status: DC | PRN
  Administered 2014-01-05 – 2014-01-06 (×3): 1 via ORAL
  Filled 2014-01-05 (×3): qty 1

## 2014-01-05 MED ORDER — CLINDAMYCIN HCL 300 MG PO CAPS: 300.0000 mg | ORAL_CAPSULE | Freq: Three times a day (TID) | ORAL | Status: DC

## 2014-01-05 MED ORDER — MAGIC MOUTHWASH W/LIDOCAINE
10.0000 mL | Freq: Four times a day (QID) | ORAL | Status: DC | PRN
  Filled 2014-01-05: qty 10

## 2014-01-05 MED ORDER — VITAMIN D3 25 MCG (1000 UNIT) PO TABS
3000.0000 [IU] | ORAL_TABLET | Freq: Every day | ORAL | Status: DC
  Filled 2014-01-05: qty 3

## 2014-01-05 MED ORDER — VITAMIN D-3 125 MCG (5000 UT) PO TABS: 1000.0000 [IU] | ORAL_TABLET | Freq: Every day | ORAL | Status: DC

## 2014-01-05 MED ORDER — EMOLLIENT BASE EX CREA: 1.0000 "application " | TOPICAL_CREAM | Freq: Two times a day (BID) | CUTANEOUS | Status: DC | PRN

## 2014-01-05 MED ORDER — POLYETHYLENE GLYCOL 3350 17 G PO PACK: 17.0000 g | PACK | Freq: Every day | ORAL | Status: DC | PRN

## 2014-01-05 MED ORDER — SPIRONOLACTONE 25 MG PO TABS
25.0000 mg | ORAL_TABLET | Freq: Every morning | ORAL | Status: DC
  Filled 2014-01-05: qty 1

## 2014-01-05 MED ORDER — ROCURONIUM BROMIDE 50 MG/5ML IV SOLN
INTRAVENOUS | Status: AC
  Filled 2014-01-05: qty 1

## 2014-01-05 MED ORDER — ASPIRIN EC 81 MG PO TBEC
81.0000 mg | DELAYED_RELEASE_TABLET | Freq: Every day | ORAL | Status: DC
  Filled 2014-01-05: qty 1

## 2014-01-05 MED ORDER — ONDANSETRON HCL 4 MG/2ML IJ SOLN
INTRAMUSCULAR | Status: DC | PRN
  Administered 2014-01-05: 4 mg via INTRAVENOUS

## 2014-01-05 MED ORDER — PROMETHAZINE HCL 25 MG PO TABS: 25.0000 mg | ORAL_TABLET | Freq: Four times a day (QID) | ORAL | Status: DC | PRN

## 2014-01-05 MED ORDER — OXYCODONE HCL 5 MG PO TABS
5.0000 mg | ORAL_TABLET | Freq: Four times a day (QID) | ORAL | Status: DC | PRN
  Administered 2014-01-05 – 2014-01-06 (×3): 5 mg via ORAL
  Filled 2014-01-05 (×3): qty 1

## 2014-01-05 MED ORDER — PROMETHAZINE HCL 25 MG RE SUPP: 25.0000 mg | Freq: Four times a day (QID) | RECTAL | Status: DC | PRN

## 2014-01-05 MED ORDER — NEOSTIGMINE METHYLSULFATE 10 MG/10ML IV SOLN
INTRAVENOUS | Status: AC
  Filled 2014-01-05: qty 1

## 2014-01-05 MED ORDER — LACTATED RINGERS IV SOLN
INTRAVENOUS | Status: DC | PRN
Start: 2014-01-05 — End: 2014-01-05
  Administered 2014-01-05: 13:00:00 via INTRAVENOUS

## 2014-01-05 MED ORDER — CEFAZOLIN SODIUM-DEXTROSE 2-3 GM-% IV SOLR
INTRAVENOUS | Status: AC
  Filled 2014-01-05: qty 50

## 2014-01-05 MED ORDER — PROPOFOL 10 MG/ML IV BOLUS
INTRAVENOUS | Status: AC
  Filled 2014-01-05: qty 20

## 2014-01-05 MED ORDER — LACTATED RINGERS IV SOLN
INTRAVENOUS | Status: DC
  Administered 2014-01-05: 10:00:00 via INTRAVENOUS

## 2014-01-05 MED ORDER — FENTANYL CITRATE 0.05 MG/ML IJ SOLN
INTRAMUSCULAR | Status: DC | PRN
  Administered 2014-01-05: 50 ug via INTRAVENOUS
  Administered 2014-01-05: 25 ug via INTRAVENOUS

## 2014-01-05 MED ORDER — MIDAZOLAM HCL 5 MG/5ML IJ SOLN
INTRAMUSCULAR | Status: DC | PRN
  Administered 2014-01-05: 2 mg via INTRAVENOUS

## 2014-01-05 MED ORDER — PROPOFOL INFUSION 10 MG/ML OPTIME
INTRAVENOUS | Status: DC | PRN
  Administered 2014-01-05: 50 ug/kg/min via INTRAVENOUS

## 2014-01-05 MED ORDER — ONDANSETRON HCL 4 MG/2ML IJ SOLN
INTRAMUSCULAR | Status: AC
  Filled 2014-01-05: qty 2

## 2014-01-05 MED ORDER — GLYCOPYRROLATE 0.2 MG/ML IJ SOLN
INTRAMUSCULAR | Status: AC
  Filled 2014-01-05: qty 3

## 2014-01-05 MED ORDER — SUCCINYLCHOLINE CHLORIDE 20 MG/ML IJ SOLN
INTRAMUSCULAR | Status: DC | PRN
  Administered 2014-01-05: 100 mg via INTRAVENOUS

## 2014-01-05 MED ORDER — PROPOFOL 10 MG/ML IV BOLUS
INTRAVENOUS | Status: DC | PRN
  Administered 2014-01-05: 100 mg via INTRAVENOUS

## 2014-01-05 MED ORDER — CLINDAMYCIN HCL 300 MG PO CAPS
300.0000 mg | ORAL_CAPSULE | Freq: Four times a day (QID) | ORAL | Status: DC
  Administered 2014-01-05 – 2014-01-06 (×3): 300 mg via ORAL
  Filled 2014-01-05 (×7): qty 1

## 2014-01-05 MED ORDER — 0.9 % SODIUM CHLORIDE (POUR BTL) OPTIME
TOPICAL | Status: DC | PRN
  Administered 2014-01-05: 1000 mL

## 2014-01-05 MED ORDER — OXYCODONE-ACETAMINOPHEN 10-325 MG PO TABS: 1.0000 | ORAL_TABLET | Freq: Four times a day (QID) | ORAL | Status: DC | PRN

## 2014-01-05 MED ORDER — DEXTROSE-NACL 5-0.9 % IV SOLN: INTRAVENOUS | Status: DC

## 2014-01-05 MED ORDER — FENTANYL CITRATE 0.05 MG/ML IJ SOLN
INTRAMUSCULAR | Status: AC
  Filled 2014-01-05: qty 5

## 2014-01-05 MED ORDER — LIDOCAINE-EPINEPHRINE 1 %-1:100000 IJ SOLN
INTRAMUSCULAR | Status: AC
  Filled 2014-01-05: qty 1

## 2014-01-05 MED ORDER — MIDAZOLAM HCL 2 MG/2ML IJ SOLN
INTRAMUSCULAR | Status: AC
  Filled 2014-01-05: qty 2

## 2014-01-05 MED ORDER — IRBESARTAN 75 MG PO TABS
75.0000 mg | ORAL_TABLET | Freq: Every day | ORAL | Status: DC
  Filled 2014-01-05: qty 1

## 2014-01-05 MED ORDER — SODIUM CHLORIDE 0.9 % IV SOLN
1000.0000 ug | INTRAVENOUS | Status: DC | PRN
  Administered 2014-01-05: .25 ug/kg/min via INTRAVENOUS

## 2014-01-05 MED ORDER — LIDOCAINE HCL (CARDIAC) 20 MG/ML IV SOLN
INTRAVENOUS | Status: DC | PRN
  Administered 2014-01-05: 40 mg via INTRAVENOUS

## 2014-01-05 MED ORDER — HYDROCODONE-ACETAMINOPHEN 7.5-325 MG/15ML PO SOLN
15.0000 mL | Freq: Four times a day (QID) | ORAL | Status: DC | PRN
Start: 2014-01-05 — End: 2014-01-20

## 2014-01-05 MED ORDER — TADALAFIL 20 MG PO TABS: 20.0000 mg | ORAL_TABLET | Freq: Every day | ORAL | Status: DC | PRN

## 2014-01-05 MED ORDER — EPINEPHRINE HCL (NASAL) 0.1 % NA SOLN
NASAL | Status: AC
  Filled 2014-01-05: qty 30

## 2014-01-05 MED ORDER — SODIUM CHLORIDE 0.9 % IV SOLN
0.0125 ug/kg/min | INTRAVENOUS | Status: DC
  Filled 2014-01-05: qty 1000

## 2014-01-05 MED ORDER — PHENYLEPHRINE HCL 10 MG/ML IJ SOLN
INTRAMUSCULAR | Status: DC | PRN
  Administered 2014-01-05 (×2): 80 ug via INTRAVENOUS

## 2014-01-05 MED ORDER — LIDOCAINE HCL (CARDIAC) 20 MG/ML IV SOLN
INTRAVENOUS | Status: AC
  Filled 2014-01-05: qty 5

## 2014-01-05 SURGICAL SUPPLY — 23 items
BALLN PULM 15 16.5 18 X 75CM (BALLOONS)
BALLN PULM 15 16.5 18X75 (BALLOONS)
BALLOON PULM 15 16.5 18X75 (BALLOONS) IMPLANT
CANISTER SUCTION 2500CC (MISCELLANEOUS) ×3 IMPLANT
COVER TABLE BACK 60X90 (DRAPES) ×3 IMPLANT
DRAPE PROXIMA HALF (DRAPES) ×3 IMPLANT
GAUZE SPONGE 4X4 16PLY XRAY LF (GAUZE/BANDAGES/DRESSINGS) ×3 IMPLANT
GLOVE ECLIPSE 7.5 STRL STRAW (GLOVE) ×6 IMPLANT
GLOVE SURG SS PI 7.0 STRL IVOR (GLOVE) ×3 IMPLANT
GOWN STRL REUS W/ TWL LRG LVL3 (GOWN DISPOSABLE) ×2 IMPLANT
GOWN STRL REUS W/TWL LRG LVL3 (GOWN DISPOSABLE) ×4
GUARD TEETH (MISCELLANEOUS) ×3 IMPLANT
KIT BASIN OR (CUSTOM PROCEDURE TRAY) ×3 IMPLANT
KIT ROOM TURNOVER OR (KITS) ×3 IMPLANT
NS IRRIG 1000ML POUR BTL (IV SOLUTION) ×3 IMPLANT
PAD ARMBOARD 7.5X6 YLW CONV (MISCELLANEOUS) ×6 IMPLANT
PATTIES SURGICAL .5 X3 (DISPOSABLE) ×3 IMPLANT
SOLUTION ANTI FOG 6CC (MISCELLANEOUS) ×3 IMPLANT
SPECIMEN JAR SMALL (MISCELLANEOUS) ×3 IMPLANT
SYR TB 1ML LUER SLIP (SYRINGE) IMPLANT
TOWEL OR 17X24 6PK STRL BLUE (TOWEL DISPOSABLE) ×3 IMPLANT
TUBE CONNECTING 12'X1/4 (SUCTIONS) ×1
TUBE CONNECTING 12X1/4 (SUCTIONS) ×2 IMPLANT

## 2014-01-05 NOTE — Progress Notes (Signed)
Pt placed on auto titrate CPAP with max 20 cmH2O and min 5 cmH2O. Pt is using his own mask from home. RT will continue to monitor,

## 2014-01-05 NOTE — Op Note (Signed)
OPERATIVE REPORT  DATE OF SURGERY: 01/05/2014  PATIENT:  [name],  58 y.o. male  PRE-OPERATIVE DIAGNOSIS:  lesion in mouth  POST-OPERATIVE DIAGNOSIS:  lesion in mouth  PROCEDURE:  Procedure(s): DIRECT LARYNGOSCOPY AND BIOPSY/POSSIBLE DEBRIDEMENT  SURGEON:  Beckie Salts, MD  ASSISTANTS:  none  ANESTHESIA:   General   EBL:  5 ml  DRAINS:  none  LOCAL MEDICATIONS USED:  None  SPECIMEN:   Biopsy right retromolar trigone area  COUNTS:  Correct  PROCEDURE DETAILS: The patient was taken to the operating room and placed on the operating table in the supine position.  Following induction of general endotracheal anesthesia, the table was turned 90 and the patient was draped in a standard fashion. A Jako laryngoscope was used to evaluate the larynx and pharynx. There is significant supraglottic edema. Cords are edematous as well. No mucosal lesions identified in the larynx or hypopharynx. Along the right retromolar trigone area was an area of erythema and raised granulation tissue. There is no exposed bone either visible or palpable. Biopsies were taken from this area. There was minimal bleeding. No other findings were noted. No adenopathy. The scope was removed, the patient was awakened extubated and transferred to recovery in stable condition.    PATIENT DISPOSITION:  To PACU, stable

## 2014-01-05 NOTE — Transfer of Care (Signed)
Immediate Anesthesia Transfer of Care Note  Patient: Melvin Hudson  Procedure(s) Performed: Procedure(s): DIRECT LARYNGOSCOPY AND BIOPSY (N/A)  Patient Location: PACU  Anesthesia Type:General  Level of Consciousness: awake, alert  and oriented  Airway & Oxygen Therapy: Patient Spontanous Breathing and Patient connected to face mask oxygen  Post-op Assessment: Report given to PACU RN and Post -op Vital signs reviewed and stable  Post vital signs: Reviewed and stable  Complications: No apparent anesthesia complications

## 2014-01-05 NOTE — H&P (Signed)
Melvin Hudson is an 58 y.o. male.   Chief Complaint: Pharyngeal ulcer HPI: Status post radiation for right tonsil cancer with persistent right oropharyngeal ulcer.  Past Medical History  Diagnosis Date  . Hypertension   . Angioedema 09/17/11    tongue and lips  . Arthritis     "both of my legs and feet"  . Obesity   . Tonsil cancer 06/14/13    Squamous cell carcinoma right tonsil  . Hx of gout   . Sleep apnea     USES C PAP  . S/P radiation therapy 07/26/2013-09/15/2013    Right tonsil/bilateral neck/ 7000 cGy    Past Surgical History  Procedure Laterality Date  . Knee arthroscopy  ~ 1994    right  . Multiple tooth extractions  ~ 2008  . Multiple extractions with alveoloplasty N/A 07/08/2013    Procedure: Extraction of tooth #'s 22, 27 with alveoloplasty and bilateal mandibular facial exostoses reductions;  Surgeon: Lenn Cal, DDS;  Location: [facility];  Service: Oral Surgery;  Laterality: N/A;  . Colonoscopy w/ biopsies and polypectomy      benign    Family History  Problem Relation Age of Onset  . Cancer Sister     breast ca   Social History:  reports that he quit smoking about 9 years ago. His smoking use included Cigarettes and Cigars. He smoked 0.00 packs per day for 40 years. He has never used smokeless tobacco. He reports that he does not drink alcohol or use illicit drugs.  Allergies:  Allergies  Allergen Reactions  . Advil [Ibuprofen]     Feels "jittery"  . Azilsartan Swelling    Avoid ARB and ACEI per MD    Medications Prior to Admission  Medication Sig Dispense Refill  . aspirin EC 81 MG tablet Take 81 mg by mouth daily. resume    . Cholecalciferol (VITAMIN D-3 PO) Take 1,000 Units by mouth daily.    [name] emollient (BIAFINE) cream Apply 1 application topically 2 (two) times daily as needed (for rash).     [name] olmesartan (BENICAR) 40 MG tablet Take 40 mg by mouth daily.    [name] oxyCODONE-acetaminophen (PERCOCET) 10-325 MG per tablet Take 1 tablet by mouth  every 6 (six) hours as needed for pain. 120 tablet 0  . polyethylene glycol (MIRALAX / GLYCOLAX) packet Take 17 g by mouth daily as needed (for constipation).     [name] spironolactone (ALDACTONE) 25 MG tablet Take 25 mg by mouth every morning.     . tadalafil (CIALIS) 20 MG tablet Take 20 mg by mouth daily as needed for erectile dysfunction.     . Alum & Mag Hydroxide-Simeth (MAGIC MOUTHWASH W/LIDOCAINE) SOLN 1 part 2% viscous lidocaine, 1 part water. Swish, gargle, and/or swallow 67mL QID, prn sore mouth/throat. Use 30min before meals & bedtime. 480 mL 5  . diphenhydrAMINE (BENADRYL) 25 MG tablet Take 1 tablet (25 mg total) by mouth every 6 (six) hours. 20 tablet 0    No results found for this or any previous visit (from the past 48 hour(s)). No results found.  ROS: otherwise negative  Blood pressure 117/75, pulse 77, temperature 99.1 F (37.3 C), temperature source Oral, resp. rate 20, weight 133.811 kg (295 lb), SpO2 96 %.  PHYSICAL EXAM: Overall appearance:  Healthy appearing, in no distress Head:  Normocephalic, atraumatic. Ears: External auditory canals are clear; tympanic membranes are intact and the middle ears are free of any effusion. Nose: External nose is healthy in  appearance. Internal nasal exam free of any lesions or obstruction. Oral Cavity/pharynx:  There are no mucosal lesions or masses identified. He is edentulous. There is a mucosal defect in the retromolar trigone on the right. It is very tender. Neuro:  No identifiable neurologic deficits. Neck: No palpable neck masses.  Studies Reviewed: none    Assessment/Plan Proceed with direct laryngoscopy, biopsy, possible debridement. He will need to spend the night since he does not have a ride home. He also does not have somebody to stay with them.  Melvin Hudson 01/05/2014, 12:37 PM

## 2014-01-05 NOTE — Plan of Care (Signed)
Problem: Phase I Progression Outcomes Goal: Pain controlled with appropriate interventions Outcome: Completed/Met Date Met:  01/05/14 Goal: OOB as tolerated unless otherwise ordered Outcome: Completed/Met Date Met:  01/05/14 Goal: Voiding-avoid urinary catheter unless indicated Outcome: Completed/Met Date Met:  01/05/14 Goal: Hemodynamically stable Outcome: Completed/Met Date Met:  01/05/14

## 2014-01-05 NOTE — Anesthesia Preprocedure Evaluation (Addendum)
Anesthesia Evaluation  Patient identified by MRN, date of birth, ID band Patient awake    Reviewed: Allergy & Precautions, H&P , NPO status , Patient's Chart, lab work & pertinent test results  Airway Mallampati: III  TM Distance: >3 FB Neck ROM: Full    Dental  (+) Edentulous Upper, Edentulous Lower   Pulmonary sleep apnea and Continuous Positive Airway Pressure Ventilation , former smoker,          Cardiovascular hypertension, Pt. on medications     Neuro/Psych negative neurological ROS  negative psych ROS   GI/Hepatic negative GI ROS, Neg liver ROS,   Endo/Other  Morbid obesity  Renal/GU negative Renal ROS     Musculoskeletal  (+) Arthritis -,   Abdominal   Peds  Hematology  (+) anemia ,   Anesthesia Other Findings   Reproductive/Obstetrics                            Anesthesia Physical Anesthesia Plan  ASA: III  Anesthesia Plan: General   Post-op Pain Management:    Induction: Intravenous  Airway Management Planned: Oral ETT  Additional Equipment:   Intra-op Plan:   Post-operative Plan: Extubation in OR  Informed Consent: I have reviewed the patients History and Physical, chart, labs and discussed the procedure including the risks, benefits and alternatives for the proposed anesthesia with the patient or authorized representative who has indicated his/her understanding and acceptance.   Dental advisory given  Plan Discussed with: CRNA and Surgeon  Anesthesia Plan Comments:         Anesthesia Quick Evaluation

## 2014-01-05 NOTE — Anesthesia Procedure Notes (Signed)
Procedure Name: Intubation Date/Time: 01/05/2014 1:24 PM Performed by: Ollen Bowl Pre-anesthesia Checklist: Patient identified, Emergency Drugs available, Suction available, Patient being monitored and Timeout performed Patient Re-evaluated:Patient Re-evaluated prior to inductionOxygen Delivery Method: Circle system utilized and Simple face mask Preoxygenation: Pre-oxygenation with 100% oxygen Intubation Type: IV induction Ventilation: Oral airway inserted - appropriate to patient size Laryngoscope Size: Mac and 4 Grade View: Grade II Tube type: Oral Tube size: 8.5 mm Number of attempts: 1 Airway Equipment and Method: Patient positioned with wedge pillow and Stylet Placement Confirmation: ETT inserted through vocal cords under direct vision,  positive ETCO2 and breath sounds checked- equal and bilateral Secured at: 23 cm Tube secured with: Tape Dental Injury: Teeth and Oropharynx as per pre-operative assessment

## 2014-01-05 NOTE — Anesthesia Postprocedure Evaluation (Signed)
  Anesthesia Post-op Note  Patient: Melvin Hudson  Procedure(s) Performed: Procedure(s): DIRECT LARYNGOSCOPY AND BIOPSY (N/A)  Patient Location: PACU  Anesthesia Type:General  Level of Consciousness: awake and alert   Airway and Oxygen Therapy: Patient Spontanous Breathing  Post-op Pain: none  Post-op Assessment: Post-op Vital signs reviewed, Patient's Cardiovascular Status Stable, Respiratory Function Stable, Patent Airway, No signs of Nausea or vomiting and Pain level controlled  Post-op Vital Signs: Reviewed and stable  Last Vitals:  Filed Vitals:   01/05/14 1415  BP: 115/73  Pulse: 79  Temp:   Resp: 15    Complications: No apparent anesthesia complications

## 2014-01-06 ENCOUNTER — Encounter (HOSPITAL_COMMUNITY): Payer: Self-pay | Admitting: Otolaryngology

## 2014-01-06 DIAGNOSIS — K1370 Unspecified lesions of oral mucosa: Secondary | ICD-10-CM | POA: Diagnosis not present

## 2014-01-06 NOTE — Discharge Planning (Signed)
Patient discharged home in stable condition. Verbalizes understanding of all discharge instructions, including home medications and follow up appointments. 

## 2014-01-06 NOTE — Discharge Summary (Signed)
  Physician Discharge Summary  Patient ID: Melvin Hudson MRN: [id number] DOB/AGE: 1955-04-23 58 y.o.  Admit date: 01/05/2014 Discharge date: 01/06/2014  Admission Diagnoses:Oral ulcer  Discharge Diagnoses:  Active Problems:   Pharyngeal cancer   Discharged Condition: good  Hospital Course: no complications  Consults: none  Significant Diagnostic Studies: none  Treatments: surgery: DL, Biopsy  Discharge Exam: Blood pressure 134/85, pulse 74, temperature 97.9 F (36.6 C), temperature source Oral, resp. rate 18, height 6\' 2"  (1.88 m), weight 134.809 kg (297 lb 3.2 oz), SpO2 98 %. PHYSICAL EXAM: Doing great. No swelling.  Disposition: 01-Home or Self Care     Medication List    TAKE these medications        aspirin EC 81 MG tablet  Take 81 mg by mouth daily. resume     clindamycin 300 MG capsule  Commonly known as:  CLEOCIN  Take 1 capsule (300 mg total) by mouth 3 (three) times daily.     diphenhydrAMINE 25 MG tablet  Commonly known as:  BENADRYL  Take 1 tablet (25 mg total) by mouth every 6 (six) hours.     emollient cream  Commonly known as:  BIAFINE  Apply 1 application topically 2 (two) times daily as needed (for rash).     HYDROcodone-acetaminophen 7.5-325 mg/15 ml solution  Commonly known as:  HYCET  Take 15 mLs by mouth 4 (four) times daily as needed for moderate pain.     magic mouthwash w/lidocaine Soln  1 part 2% viscous lidocaine, 1 part water. Swish, gargle, and/or swallow 67mL QID, prn sore mouth/throat. Use 54min before meals & bedtime.     olmesartan 40 MG tablet  Commonly known as:  BENICAR  Take 40 mg by mouth daily.     oxyCODONE-acetaminophen 10-325 MG per tablet  Commonly known as:  PERCOCET  Take 1 tablet by mouth every 6 (six) hours as needed for pain.     polyethylene glycol packet  Commonly known as:  MIRALAX / GLYCOLAX  Take 17 g by mouth daily as needed (for constipation).     promethazine 25 MG suppository  Commonly  known as:  PHENERGAN  Place 1 suppository (25 mg total) rectally every 6 (six) hours as needed for nausea or vomiting.     spironolactone 25 MG tablet  Commonly known as:  ALDACTONE  Take 25 mg by mouth every morning.     tadalafil 20 MG tablet  Commonly known as:  CIALIS  Take 20 mg by mouth daily as needed for erectile dysfunction.     VITAMIN D-3 PO  Take 1,000 Units by mouth daily.         SignedIzora Gala 01/06/2014, 8:29 AM

## 2014-01-06 NOTE — Plan of Care (Signed)
Problem: Phase II Progression Outcomes Goal: Progress activity as tolerated unless otherwise ordered Outcome: Completed/Met Date Met:  01/06/14 Goal: Vital signs remain stable Outcome: Completed/Met Date Met:  01/06/14 Goal: IV changed to normal saline lock Outcome: Completed/Met Date Met:  01/06/14 Goal: Obtain order to discontinue catheter if appropriate Outcome: Not Applicable Date Met:  33/17/40  Problem: Phase III Progression Outcomes Goal: Activity at appropriate level-compared to baseline (UP IN CHAIR FOR HEMODIALYSIS)  Outcome: Completed/Met Date Met:  01/06/14 Goal: Voiding independently Outcome: Completed/Met Date Met:  01/06/14 Goal: Foley discontinued Outcome: Not Applicable Date Met:  99/27/80

## 2014-01-17 ENCOUNTER — Ambulatory Visit (HOSPITAL_COMMUNITY): Payer: Medicare Other

## 2014-01-19 ENCOUNTER — Ambulatory Visit
Admission: RE | Admit: 2014-01-19 | Discharge: 2014-01-19 | Disposition: A | Discharge status: Home / Self Care | Payer: Medicare Other | Source: Ambulatory Visit | Attending: Radiation Oncology | Admitting: Radiation Oncology

## 2014-01-19 ENCOUNTER — Ambulatory Visit (HOSPITAL_COMMUNITY)
Admission: RE | Admit: 2014-01-19 | Discharge: 2014-01-19 | Disposition: A | Discharge status: Home / Self Care | Payer: Medicare Other | Source: Ambulatory Visit | Attending: Radiation Oncology | Admitting: Radiation Oncology

## 2014-01-19 DIAGNOSIS — C099 Malignant neoplasm of tonsil, unspecified: Secondary | ICD-10-CM

## 2014-01-19 DIAGNOSIS — R635 Abnormal weight gain: Secondary | ICD-10-CM

## 2014-01-19 DIAGNOSIS — C770 Secondary and unspecified malignant neoplasm of lymph nodes of head, face and neck: Secondary | ICD-10-CM | POA: Insufficient documentation

## 2014-01-19 LAB — TSH CHCC: TSH: 1.035 m(IU)/L (ref 0.320–4.118)

## 2014-01-19 LAB — GLUCOSE, CAPILLARY: Glucose-Capillary: 94 mg/dL (ref 70–99)

## 2014-01-19 MED ORDER — FLUDEOXYGLUCOSE F - 18 (FDG) INJECTION
14.6200 | Freq: Once | INTRAVENOUS | Status: AC | PRN
  Administered 2014-01-19: 14.62 via INTRAVENOUS

## 2014-01-20 ENCOUNTER — Ambulatory Visit
Admission: RE | Admit: 2014-01-20 | Discharge: 2014-01-20 | Disposition: A | Discharge status: Home / Self Care | Payer: Medicare Other | Source: Ambulatory Visit | Attending: Radiation Oncology | Admitting: Radiation Oncology

## 2014-01-20 ENCOUNTER — Encounter: Payer: Self-pay | Admitting: Radiation Oncology

## 2014-01-20 VITALS — BP 116/64 | HR 79 | Temp 97.7°F | Ht 74.0 in | Wt 298.8 lb

## 2014-01-20 DIAGNOSIS — C099 Malignant neoplasm of tonsil, unspecified: Secondary | ICD-10-CM

## 2014-01-20 NOTE — Progress Notes (Signed)
Radiation Oncology         [phone] ________________________________  Name: Melvin Hudson MRN: [id number]  Date: 01/20/2014  DOB: 25-Aug-1955  Follow-Up Visit Note  REFERRING MD - Melvin Hudson  CC: Melvin Garbe, MD  Melvin Gala, MD   ICD-9-CM ICD-10-CM   1. 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  Melvin Hudson here for reassessment s/o radiation therapy for right tonsillar cancer.  He recently had a biopsy of his right oropharynx which was negative for malignancy. He reports that he had had improvement of his pain in this area, ever since biopsy.  Maintaining weight.    Feels well, overall.                      ALLERGIES:  is allergic to advil and azilsartan.  Meds: Current Outpatient Prescriptions  Medication Sig Dispense Refill  . Alum & Mag Hydroxide-Simeth (MAGIC MOUTHWASH W/LIDOCAINE) SOLN 1 part 2% viscous lidocaine, 1 part water. Swish, gargle, and/or swallow 83mL QID, prn sore mouth/throat. Use 79min before meals & bedtime. 480 mL 5  . aspirin EC 81 MG tablet Take 81 mg by mouth daily. resume    . Cholecalciferol (VITAMIN D-3 PO) Take 1,000 Units by mouth daily.    . diphenhydrAMINE (BENADRYL) 25 MG tablet Take 1 tablet (25 mg total) by mouth every 6 (six) hours. 20 tablet 0  . olmesartan (BENICAR) 40 MG tablet Take 40 mg by mouth daily.    [name] oxyCODONE-acetaminophen (PERCOCET) 10-325 MG per tablet Take 1 tablet by mouth every 6 (six) hours as needed for pain. 120 tablet 0  . polyethylene glycol (MIRALAX / GLYCOLAX) packet Take 17 g by mouth  daily as needed (for constipation).     [name] spironolactone (ALDACTONE) 25 MG tablet Take 25 mg by mouth every morning.     . tadalafil (CIALIS) 20 MG tablet Take 20 mg by mouth daily as needed for erectile dysfunction.      No current facility-administered medications for this encounter.    Physical Findings: The patient is in no acute distress. Patient is alert and oriented.  height is 6\' 2"  (1.88 m) and weight is 298 lb 12.8 oz (135.535 kg). His temperature is 97.7 F (36.5 C). His blood pressure is 116/64 and his pulse is 79. His oxygen saturation is 100%. . Oropharyngeal mucosa is intact with no thrush or tumor.  Right tonsillar region has tissue that appears c/w healing granulation tissue.  No palpable cervical or supraclavicular lymphadenopathy. Skin intact and dry over neck.      Lab Findings: Lab Results  Component Value Date   WBC 4.9 01/03/2014   HGB 12.4* 01/03/2014   HCT 35.3* 01/03/2014   MCV 81.3 01/03/2014   PLT 274 01/03/2014    Lab Results  Component Value Date   TSH 1.035 01/19/2014    Radiographic Findings: Nm Pet Image Restag (ps) Skull Base To Thigh  01/19/2014   CLINICAL DATA:  Subsequent treatment strategy for restaging of tonsillar carcinoma.  EXAM: NUCLEAR MEDICINE PET SKULL BASE TO THIGH  TECHNIQUE: 14.6 mCi F-18 FDG was injected intravenously. Full-ring PET imaging was performed from the skull base to thigh after the radiotracer. CT data was obtained and used for attenuation correction and anatomic localization.  FASTING BLOOD GLUCOSE:  Value: 94 mg/dl  COMPARISON:  07/04/2013 and CT of 06/23/2013.  FINDINGS: NECK  Improvement to resolution of right palatine tonsil hypermetabolism. Mild residual activity is identified about the anterior aspect of the right palatine tonsil and posterior lateral right tongue base. No well-defined residual mass in this area. This measures a S.U.V. max of 5.9, including on image 27 of series 4. On the prior exam, the mass measured a  S.U.V. max of 18.3. The contralateral tonsillar hypermetabolism is resolved. No residual hypermetabolic cervical nodes.  CHEST  No areas of abnormal hypermetabolism.  ABDOMEN/PELVIS  No areas of abnormal hypermetabolism.  SKELETON  Right rotator cuff arthropathy.  No suspicious osseous lesions.  CT IMAGES PERFORMED FOR ATTENUATION CORRECTION  No cervical adenopathy.  No thoracic adenopathy. Minimal subpleural right upper lobe nodularity on image 20 is favored to be similar to on the prior exam and non specific.  Bilateral perirenal edema is similar and could relate to renal insufficiency. Aortic atherosclerosis. Scattered colonic diverticula. Heterogeneously increased density throughout the marrow space. Example within the pelvis on image 160 of series 4. Felt to be similar.  IMPRESSION: 1. Response to therapy of right-sided tonsillar mass. Mild residual hypermetabolism anteriorly could be treatment related. No well-defined mass in this area. Consider direct visualization. 2. Response to therapy of cervical nodal metastasis. 3. No evidence of new or progressive disease. 4. Heterogeneous increased marrow density. Similar and without PET correlate. Renal osteodystrophy could have this appearance. Correlate with renal function.   Electronically Signed   By: Abigail Miyamoto M.D.   On: 01/19/2014 10:20     Impression/Plan:    1) Head and Neck Cancer Status: healing from RT, NED  2) Nutritional Status:no issues, no PEG  3) Risk Factors: The patient has been educated about risk factors including alcohol and tobacco abuse; they understand that avoidance of alcohol and tobacco is important to prevent recurrences as well as other cancers  4) Swallowing: no issues  5) Dental: Encouraged to continue regular followup with dentistry, and dental hygiene including fluoride rinses.   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  NED.  9) Follow-up in 4 mo with CT neck/chest. The patient was encouraged to call with any issues or questions before then   _____________________________________   Eppie Gibson, MD

## 2014-01-20 NOTE — Progress Notes (Signed)
Mr. Galentine here for reassessment s/o radiation therapy for right tonsillar cancer.  He recently had a biopsy of his right side jaw which was negative for malignancy. He has a large protuberant area on the inside of his right check. He reports that he does not have any pain in this area, but notes level 5/10 pain if he eats foods thick or textured His oral mucosa is moist and intact. Maintaining weight.

## 2014-01-22 MED ORDER — VITAMIN E 180 MG (400 UNIT) PO CAPS: ORAL_CAPSULE | ORAL | Status: DC

## 2014-01-22 MED ORDER — PENTOXIFYLLINE ER 400 MG PO TBCR: EXTENDED_RELEASE_TABLET | ORAL | Status: DC

## 2014-01-23 ENCOUNTER — Telehealth: Payer: Self-pay | Admitting: *Deleted

## 2014-01-23 NOTE — Telephone Encounter (Signed)
CALLED PATIENT TO INFORM OF CT AND FU FOR 05-2014, SPOKE WITH PATIENT AND HE IS AWARE OF THESE APPTS.

## 2014-01-26 ENCOUNTER — Encounter (HOSPITAL_COMMUNITY): Payer: Self-pay | Admitting: Cardiology

## 2014-03-24 ENCOUNTER — Telehealth: Payer: Self-pay | Admitting: Pulmonary Disease

## 2014-03-24 DIAGNOSIS — G4733 Obstructive sleep apnea (adult) (pediatric): Secondary | ICD-10-CM

## 2014-03-24 NOTE — Telephone Encounter (Signed)
Spoke with Caryl Pina at [facility], states that pt called them stating he needs new 02 supplies.  Pt is needing new face mask, tubing, filters, heated hose, water chamber.    [place] are you ok with this order?  Thanks!

## 2014-03-24 NOTE — Telephone Encounter (Signed)
Ok with me 

## 2014-03-27 NOTE — Telephone Encounter (Signed)
Order has been placed. Nothing further was needed. 

## 2014-04-08 ENCOUNTER — Inpatient Hospital Stay (HOSPITAL_COMMUNITY)
Admission: EM | Admit: 2014-04-08 | Discharge: 2014-04-15 | DRG: 377 | Disposition: A | Discharge status: Home / Self Care | Payer: Medicare Other | Attending: Family Medicine | Admitting: Family Medicine

## 2014-04-08 ENCOUNTER — Encounter (HOSPITAL_COMMUNITY): Payer: Self-pay

## 2014-04-08 DIAGNOSIS — D649 Anemia, unspecified: Secondary | ICD-10-CM | POA: Diagnosis not present

## 2014-04-08 DIAGNOSIS — Z85818 Personal history of malignant neoplasm of other sites of lip, oral cavity, and pharynx: Secondary | ICD-10-CM

## 2014-04-08 DIAGNOSIS — G4733 Obstructive sleep apnea (adult) (pediatric): Secondary | ICD-10-CM | POA: Diagnosis present

## 2014-04-08 DIAGNOSIS — I10 Essential (primary) hypertension: Secondary | ICD-10-CM | POA: Diagnosis present

## 2014-04-08 DIAGNOSIS — K922 Gastrointestinal hemorrhage, unspecified: Secondary | ICD-10-CM | POA: Diagnosis not present

## 2014-04-08 DIAGNOSIS — E669 Obesity, unspecified: Secondary | ICD-10-CM | POA: Diagnosis present

## 2014-04-08 DIAGNOSIS — J9584 Transfusion-related acute lung injury (TRALI): Secondary | ICD-10-CM

## 2014-04-08 DIAGNOSIS — Z87891 Personal history of nicotine dependence: Secondary | ICD-10-CM | POA: Diagnosis not present

## 2014-04-08 DIAGNOSIS — R5383 Other fatigue: Secondary | ICD-10-CM | POA: Diagnosis present

## 2014-04-08 DIAGNOSIS — Z923 Personal history of irradiation: Secondary | ICD-10-CM | POA: Diagnosis not present

## 2014-04-08 DIAGNOSIS — K5731 Diverticulosis of large intestine without perforation or abscess with bleeding: Secondary | ICD-10-CM | POA: Diagnosis present

## 2014-04-08 DIAGNOSIS — D62 Acute posthemorrhagic anemia: Secondary | ICD-10-CM | POA: Diagnosis not present

## 2014-04-08 DIAGNOSIS — K625 Hemorrhage of anus and rectum: Secondary | ICD-10-CM

## 2014-04-08 DIAGNOSIS — G8929 Other chronic pain: Secondary | ICD-10-CM | POA: Diagnosis present

## 2014-04-08 DIAGNOSIS — Z79899 Other long term (current) drug therapy: Secondary | ICD-10-CM

## 2014-04-08 DIAGNOSIS — Z6838 Body mass index (BMI) 38.0-38.9, adult: Secondary | ICD-10-CM | POA: Diagnosis not present

## 2014-04-08 DIAGNOSIS — Z7982 Long term (current) use of aspirin: Secondary | ICD-10-CM

## 2014-04-08 DIAGNOSIS — R578 Other shock: Secondary | ICD-10-CM | POA: Diagnosis present

## 2014-04-08 DIAGNOSIS — E876 Hypokalemia: Secondary | ICD-10-CM | POA: Diagnosis present

## 2014-04-08 DIAGNOSIS — I959 Hypotension, unspecified: Secondary | ICD-10-CM | POA: Diagnosis present

## 2014-04-08 DIAGNOSIS — Z452 Encounter for adjustment and management of vascular access device: Secondary | ICD-10-CM | POA: Insufficient documentation

## 2014-04-08 DIAGNOSIS — C090 Malignant neoplasm of tonsillar fossa: Secondary | ICD-10-CM | POA: Diagnosis present

## 2014-04-08 DIAGNOSIS — K921 Melena: Secondary | ICD-10-CM

## 2014-04-08 DIAGNOSIS — R1011 Right upper quadrant pain: Secondary | ICD-10-CM

## 2014-04-08 LAB — COMPREHENSIVE METABOLIC PANEL
ALK PHOS: 111 U/L (ref 39–117)
ALT: 10 U/L (ref 0–53)
ANION GAP: 8 (ref 5–15)
AST: 17 U/L (ref 0–37)
Albumin: 3.5 g/dL (ref 3.5–5.2)
BILIRUBIN TOTAL: 0.7 mg/dL (ref 0.3–1.2)
BUN: 13 mg/dL (ref 6–23)
CHLORIDE: 106 mmol/L (ref 96–112)
CO2: 24 mmol/L (ref 19–32)
Calcium: 8.4 mg/dL (ref 8.4–10.5)
Creatinine, Ser: 1.17 mg/dL (ref 0.50–1.35)
GFR calc non Af Amer: 66 mL/min — ABNORMAL LOW (ref >90)
GFR, EST AFRICAN AMERICAN: 77 mL/min — AB (ref >90)
Glucose, Bld: 164 mg/dL — ABNORMAL HIGH (ref 70–99)
Potassium: 3.7 mmol/L (ref 3.5–5.1)
Sodium: 138 mmol/L (ref 135–145)
TOTAL PROTEIN: 6 g/dL (ref 6.0–8.3)

## 2014-04-08 LAB — I-STAT CHEM 8, ED
BUN: 14 mg/dL (ref 6–23)
CALCIUM ION: 1.12 mmol/L — AB (ref 1.13–1.30)
Chloride: 102 mmol/L (ref 96–112)
Creatinine, Ser: 1.2 mg/dL (ref 0.50–1.35)
Glucose, Bld: 161 mg/dL — ABNORMAL HIGH (ref 70–99)
HEMATOCRIT: 30 % — AB (ref 39.0–52.0)
Hemoglobin: 10.2 g/dL — ABNORMAL LOW (ref 13.0–17.0)
Potassium: 3.7 mmol/L (ref 3.5–5.1)
Sodium: 141 mmol/L (ref 135–145)
TCO2: 20 mmol/L (ref 0–100)

## 2014-04-08 LAB — CBC WITH DIFFERENTIAL/PLATELET
BASOS ABS: 0 10*3/uL (ref 0.0–0.1)
Basophils Relative: 0 % (ref 0–1)
Eosinophils Absolute: 0.2 10*3/uL (ref 0.0–0.7)
Eosinophils Relative: 3 % (ref 0–5)
HEMATOCRIT: 30 % — AB (ref 39.0–52.0)
Hemoglobin: 10.4 g/dL — ABNORMAL LOW (ref 13.0–17.0)
LYMPHS ABS: 0.9 10*3/uL (ref 0.7–4.0)
LYMPHS PCT: 15 % (ref 12–46)
MCH: 28.4 pg (ref 26.0–34.0)
MCHC: 34.7 g/dL (ref 30.0–36.0)
MCV: 82 fL (ref 78.0–100.0)
MONO ABS: 0.3 10*3/uL (ref 0.1–1.0)
Monocytes Relative: 5 % (ref 3–12)
NEUTROS ABS: 4.9 10*3/uL (ref 1.7–7.7)
NEUTROS PCT: 77 % (ref 43–77)
Platelets: 245 10*3/uL (ref 150–400)
RBC: 3.66 MIL/uL — ABNORMAL LOW (ref 4.22–5.81)
RDW: 17.2 % — ABNORMAL HIGH (ref 11.5–15.5)
WBC: 6.3 10*3/uL (ref 4.0–10.5)

## 2014-04-08 LAB — POC OCCULT BLOOD, ED: Fecal Occult Bld: POSITIVE — AB

## 2014-04-08 LAB — PREPARE RBC (CROSSMATCH)

## 2014-04-08 MED ORDER — ONDANSETRON HCL 4 MG/2ML IJ SOLN: 4.0000 mg | Freq: Four times a day (QID) | INTRAMUSCULAR | Status: DC | PRN

## 2014-04-08 MED ORDER — SODIUM CHLORIDE 0.9 % IV SOLN: Freq: Once | INTRAVENOUS | Status: AC

## 2014-04-08 MED ORDER — OXYCODONE-ACETAMINOPHEN 5-325 MG PO TABS
1.0000 | ORAL_TABLET | Freq: Four times a day (QID) | ORAL | Status: DC | PRN
  Administered 2014-04-08 – 2014-04-10 (×6): 1 via ORAL
  Filled 2014-04-08 (×6): qty 1

## 2014-04-08 MED ORDER — VITAMIN E 180 MG (400 UNIT) PO CAPS
400.0000 [IU] | ORAL_CAPSULE | Freq: Every day | ORAL | Status: DC
  Administered 2014-04-09 – 2014-04-10 (×2): 400 [IU] via ORAL
  Filled 2014-04-08 (×3): qty 1

## 2014-04-08 MED ORDER — PANTOPRAZOLE SODIUM 40 MG IV SOLR: 40.0000 mg | Freq: Once | INTRAVENOUS | Status: DC

## 2014-04-08 MED ORDER — SODIUM CHLORIDE 0.9 % IV BOLUS (SEPSIS)
1000.0000 mL | Freq: Once | INTRAVENOUS | Status: AC
  Administered 2014-04-08: 1000 mL via INTRAVENOUS

## 2014-04-08 MED ORDER — OXYCODONE HCL 5 MG PO TABS
5.0000 mg | ORAL_TABLET | Freq: Four times a day (QID) | ORAL | Status: DC | PRN
  Administered 2014-04-08 – 2014-04-10 (×6): 5 mg via ORAL
  Filled 2014-04-08 (×6): qty 1

## 2014-04-08 MED ORDER — PANTOPRAZOLE SODIUM 40 MG IV SOLR
40.0000 mg | Freq: Two times a day (BID) | INTRAVENOUS | Status: DC
  Administered 2014-04-09 – 2014-04-15 (×12): 40 mg via INTRAVENOUS
  Filled 2014-04-08 (×18): qty 40

## 2014-04-08 MED ORDER — ONDANSETRON HCL 4 MG PO TABS: 4.0000 mg | ORAL_TABLET | Freq: Four times a day (QID) | ORAL | Status: DC | PRN

## 2014-04-08 MED ORDER — ACETAMINOPHEN 325 MG PO TABS: 650.0000 mg | ORAL_TABLET | Freq: Four times a day (QID) | ORAL | Status: DC | PRN

## 2014-04-08 MED ORDER — TRAZODONE HCL 50 MG PO TABS
50.0000 mg | ORAL_TABLET | Freq: Every evening | ORAL | Status: DC | PRN
  Filled 2014-04-08: qty 1

## 2014-04-08 MED ORDER — ACETAMINOPHEN 650 MG RE SUPP: 650.0000 mg | Freq: Four times a day (QID) | RECTAL | Status: DC | PRN

## 2014-04-08 MED ORDER — SODIUM CHLORIDE 0.9 % IV SOLN
8.0000 mg/h | INTRAVENOUS | Status: DC
  Administered 2014-04-08: 8 mg/h via INTRAVENOUS
  Filled 2014-04-08 (×2): qty 80

## 2014-04-08 MED ORDER — BIOTENE DRY MOUTH MT LIQD: 15.0000 mL | OROMUCOSAL | Status: DC | PRN

## 2014-04-08 MED ORDER — SODIUM CHLORIDE 0.9 % IJ SOLN
3.0000 mL | Freq: Two times a day (BID) | INTRAMUSCULAR | Status: DC
  Administered 2014-04-09 – 2014-04-14 (×7): 3 mL via INTRAVENOUS

## 2014-04-08 MED ORDER — SODIUM CHLORIDE 0.9 % IV SOLN
80.0000 mg | Freq: Once | INTRAVENOUS | Status: AC
  Administered 2014-04-08: 80 mg via INTRAVENOUS
  Filled 2014-04-08: qty 80

## 2014-04-08 MED ORDER — MAGIC MOUTHWASH W/LIDOCAINE
10.0000 mL | Freq: Four times a day (QID) | ORAL | Status: DC | PRN
Start: 2014-04-08 — End: 2014-04-15
  Filled 2014-04-08: qty 10

## 2014-04-08 MED ORDER — SODIUM CHLORIDE 0.9 % IV SOLN
INTRAVENOUS | Status: DC
  Administered 2014-04-09 – 2014-04-11 (×3): via INTRAVENOUS

## 2014-04-08 NOTE — ED Notes (Addendum)
Per EMS, pt called due to rectal bleeding. EMS reports that there was coffee ground stool and clots in the toilet. Pt had the same thing happen last week but did not come to the hospital. Pts HR: 130, BP: 101/74. Pt alert x 4. Pt received 558ml NS in route. Primary RN to finish triage.

## 2014-04-08 NOTE — H&P (Signed)
[facility] Admission History and Physical Service Pager: [phone]  Patient name: Melvin Hudson Medical record number: [id number] Date of birth: October 22, 1955 Age: 59 y.o. Gender: male  Primary Care Provider: Kristine Garbe, MD Consultants: GI Code Status: FULL (discussed on admission)  Chief Complaint: GI bleed, fatigue  Assessment and Plan: Melvin Hudson is a 59 y.o. male presenting with GI bleed x2 days and fatigue x1 day . PMH is significant for Malignant neoplasm of tonsil, HTN, OSA, tobacco abuse  Melena/GI Bleed: Hgb 10.4>10.2 in ED.  Patient with multiple loose bloody stools today.  Patient with known h/o squamous cell carcinoma of the Right tonsil.  Last colonoscopy several years ago.  FOBT+. S/p 2 unit pRBCs in ED.  Tachycardic to 132 in ED.  BP 95/64 in ED.  Currently 106/68, HR 97. Afebrile -Admit to FPTS under Dr Erin Hearing, telemetry -Vitals per floor protocol -Post transfusion CBC -Repeat CBC in am, then plan Q 8 CBCs until stablizes -c/s GI: Dr Oletta Lamas to see in am -Will FYI Oncology after GI evaluation -Protonix BID -Tylenol PRN, Zofran PRN  -Repeat EKG in am -NPO after midnight to prepare for procedures if GI would like to do them.   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  Sees Dr Isidore Moos for this.  CT neck/chest scheduled for April 2016.  Biopsy in 12/2013 negative for dysplasia or malignancy.  -Hold home Trental in setting of GI bleed. -Biotene, Vitamin E, MMW  HTN: hypotensive on admission.  BP currently 106/68 . aldactone, benicar at home.   -Will hold home BP medications in setting of initial soft BPs and current normotensive state -Consider adding back BP medications if BP elevates  OSA: CPAP at home -CPAP QHS  Tobacco abuse: not currently a smoker -No nicotine patches needed  FEN/GI: NS @125cc /h, NPO MDN, PPI BID Prophylaxis: SCDs in setting of bleed  Disposition: Admit to FPTS under Dr  Erin Hearing  History of Present Illness: Melvin Hudson is a 59 y.o. male presenting with GI bleed  Patient reports that he has had intermittent bloody stools in the past.  Most recently about 1 month ago, but they always resolved.  He reports that loose bloody stools started around 12pm today.  He noticed that he was weak and tired and as a result called a friend to bring him in for medical care.  Once friend arrived patient decided that situation was more emergent and thus was brought to [facility] via EMS.  Patient reports that he has had at least 8 bloody stools today.  He reports bright red blood.  Patient did not complain of rectal pain.  He denied nausea, vomiting, SOB, CP, fevers.  Endorses dizziness earlier with weakness and fatigue.  Symptoms have significantly improved since getting to the hospital.  Patient reports that he is now able to get to restroom without getting tired.    Of note, patient lives alone and performs ADLs independently.  In ED, patient was found to be FOBT positive, he was tachycardic to the 130's and hypotensive to 95/64.  GI was consulted and advised that patient be placed on PPI.  He will be seen in am.  2 units of pRBCs were ordered and administered on floor.  Review Of Systems: Per HPI with the following additions: none Otherwise 12 point review of systems was performed and was unremarkable.  Patient Active Problem List   Diagnosis Date Noted  . 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 Mouth pain 07/18/2013  .  Weight loss 07/18/2013  . Post-operative state 07/08/2013  . Post-op pain 07/08/2013  . Malignant neoplasm of tonsil 06/29/2013  . OSA (obstructive sleep apnea) 11/19/2011  . Angio-edema 09/17/2011  . hypotension  09/17/2011  . HTN (hypertension) 09/17/2011  . Gout 09/17/2011  . Tobacco abuse 09/17/2011   Past Medical History: Past Medical History  Diagnosis Date  . Hypertension   . Angioedema 09/17/11    tongue and lips  . Obesity   . Hx of gout   . S/P  radiation therapy 07/26/2013-09/15/2013    Right tonsil/bilateral neck/ 7000 cGy  . 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 Arthritis     "both of my legs and feet" (01/05/2014)  . OSA on CPAP    Past Surgical History: Past Surgical History  Procedure Laterality Date  . Knee arthroscopy Right ~ 1994  . Multiple tooth extractions  ~ 2008  . Multiple extractions with alveoloplasty N/A 07/08/2013    Procedure: Extraction of tooth #'s 22, 27 with alveoloplasty and bilateal mandibular facial exostoses reductions;  Surgeon: Lenn Cal, DDS;  Location: [facility];  Service: Oral Surgery;  Laterality: N/A;  . Colonoscopy w/ biopsies and polypectomy      benign  . Direct laryngoscopy  01/05/2014  . Direct laryngoscopy N/A 01/05/2014    Procedure: DIRECT LARYNGOSCOPY AND BIOPSY;  Surgeon: [name], MD;  Location: [facility];  Service: ENT;  Laterality: N/A;  . Left and right heart catheterization with coronary/graft angiogram N/A 12/31/2010    Procedure: LEFT AND RIGHT HEART CATHETERIZATION WITH Beatrix Fetters;  Surgeon: Laverda Page, MD;  Location: [facility] CATH LAB;  Service: Cardiovascular;  Laterality: N/A;   Social History: History  Substance Use Topics  . Smoking status: Former Smoker -- 42 years    Types: Cigarettes, Cigars    Quit date: 06/17/2013  . Smokeless tobacco: Never Used     Comment: "Quit smoking cigarettes in ~  2007"  . Alcohol Use: Yes     Comment: 01/05/2014 "sober since 1987"   Additional social history: smokes cigars   Please also refer to relevant sections of EMR.  Family History: Family History  Problem Relation Age of Onset  . Cancer Sister     breast ca   Allergies and Medications: Allergies  Allergen Reactions  . Advil [Ibuprofen]     Feels "jittery"  . Azilsartan Swelling    Avoid ARB and ACEI per MD   No current facility-administered medications on file prior to encounter.   Current Outpatient Prescriptions on File Prior to  Encounter  Medication Sig Dispense Refill  . aspirin EC 81 MG tablet Take 81 mg by mouth daily. resume    . Cholecalciferol (VITAMIN D-3 PO) Take 1,000 Units by mouth daily.    [name] olmesartan (BENICAR) 40 MG tablet Take 40 mg by mouth daily.    [name] oxyCODONE-acetaminophen (PERCOCET) 10-325 MG per tablet Take 1 tablet by mouth every 6 (six) hours as needed for pain. 120 tablet 0  . pentoxifylline (TRENTAL) 400 MG CR tablet Take 1 tab daily x 1 week, then take 1 tab BID thereafter 60 tablet 4  . polyethylene glycol (MIRALAX / GLYCOLAX) packet Take 17 g by mouth daily as needed (for constipation).     [name] spironolactone (ALDACTONE) 25 MG tablet Take 25 mg by mouth every morning.     . tadalafil (CIALIS) 20 MG tablet Take 20 mg by mouth daily as needed for erectile dysfunction.     [name]  vitamin E (VITAMIN E) 400 UNIT capsule Take 1 cap daily x 1 week, then take 1 cap BID thereafter 60 capsule 4  . Alum & Mag Hydroxide-Simeth (MAGIC MOUTHWASH W/LIDOCAINE) SOLN 1 part 2% viscous lidocaine, 1 part water. Swish, gargle, and/or swallow 47mL QID, prn sore mouth/throat. Use 85min before meals & bedtime. (Patient not taking: Reported on 04/08/2014) 480 mL 5  . diphenhydrAMINE (BENADRYL) 25 MG tablet Take 1 tablet (25 mg total) by mouth every 6 (six) hours. (Patient not taking: Reported on 04/08/2014) 20 tablet 0    Objective: BP 105/67 mmHg  Pulse 114  Temp(Src) 97.3 F (36.3 C) (Oral)  Resp 25  SpO2 96% Exam: General: awake, alert, well appearing male, sitting up in bed, NAD HEENT: [place]/AT, EOMI, o/p with tobacco stained tongue, no teeth Neck: large, firm, submandibular mass, no TTP Cardiovascular: RRR, no m/r/g Respiratory: CTAB, no increased WOB Abdomen: obese, soft, NT/ND, +BS Extremities: WWP, no edema, onychomycosis of L big toe, +2DP Skin: dry, intact, no rashes Neuro: AO, follows commands, strength 5/5 in BL UE  Labs and Imaging: CBC BMET   Recent Labs Lab 04/08/14 1927 04/08/14 1935  WBC 6.3   --   HGB 10.4* 10.2*  HCT 30.0* 30.0*  PLT 245  --     Recent Labs Lab 04/08/14 1927 04/08/14 1935  NA 138 141  K 3.7 3.7  CL 106 102  CO2 24  --   BUN 13 14  CREATININE 1.17 1.20  GLUCOSE 164* 161*  CALCIUM 8.4  --      No results found.  Janora Norlander, DO 04/08/2014, 8:57 PM PGY-1, [place] Intern pager: [phone], text pages welcome   I have seen and examined the patient with Dr. Lajuana Ripple and I agree with her documentation above. My annotations are in blue.   Laroy Apple, MD [place] Resident, PGY-3 04/08/2014, 10:59 PM

## 2014-04-08 NOTE — ED Provider Notes (Signed)
CSN: [id number]     Arrival date & time 04/08/14  1907 History   First MD Initiated Contact with Patient 04/08/14 1908     Chief Complaint  Patient presents with  . Rectal Bleeding     (Consider location/radiation/quality/duration/timing/severity/associated sxs/prior Treatment) HPI Comments: Patient presents emergency department with chief complaint of rectal bleeding. He states that he noticed coffee-ground stool and blood clots in the toilet today. States that he might of had some bleeding yesterday as well. He states that stay he has felt very weak and tired. He denies any dizziness, or chest pain. Denies any shortness of breath. He states that his fatigue is worsened with activity, but denies associated chest pain. He denies being in any pain. He takes aspirin daily.  The history is provided by the patient. No language interpreter was used.    Past Medical History  Diagnosis Date  . Hypertension   . Angioedema 09/17/11    tongue and lips  . Obesity   . Hx of gout   . S/P radiation therapy 07/26/2013-09/15/2013    Right tonsil/bilateral neck/ 7000 cGy  . 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 Arthritis     "both of my legs and feet" (01/05/2014)  . OSA on CPAP    Past Surgical History  Procedure Laterality Date  . Knee arthroscopy Right ~ 1994  . Multiple tooth extractions  ~ 2008  . Multiple extractions with alveoloplasty N/A 07/08/2013    Procedure: Extraction of tooth #'s 22, 27 with alveoloplasty and bilateal mandibular facial exostoses reductions;  Surgeon: Lenn Cal, DDS;  Location: [facility];  Service: Oral Surgery;  Laterality: N/A;  . Colonoscopy w/ biopsies and polypectomy      benign  . Direct laryngoscopy  01/05/2014  . Direct laryngoscopy N/A 01/05/2014    Procedure: DIRECT LARYNGOSCOPY AND BIOPSY;  Surgeon: [name], MD;  Location: [place];  Service: ENT;  Laterality: N/A;  . Left and right heart catheterization with coronary/graft angiogram N/A  12/31/2010    Procedure: LEFT AND RIGHT HEART CATHETERIZATION WITH Beatrix Fetters;  Surgeon: Laverda Page, MD;  Location: [facility] CATH LAB;  Service: Cardiovascular;  Laterality: N/A;   Family History  Problem Relation Age of Onset  . Cancer Sister     breast ca   History  Substance Use Topics  . Smoking status: Former Smoker -- 42 years    Types: Cigarettes, Cigars    Quit date: 06/17/2013  . Smokeless tobacco: Never Used     Comment: "Quit smoking cigarettes in ~  2007"  . 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  Respiratory: Negative for shortness of breath.   Cardiovascular: Negative for chest pain.  Gastrointestinal: Positive for blood in stool. Negative for nausea, vomiting, diarrhea and constipation.  Genitourinary: Negative for dysuria.  All other systems reviewed and are negative.     Allergies  Advil and Azilsartan  Home Medications   Prior to Admission medications   Medication Sig Start Date End Date Taking? Authorizing Provider  Alum & Mag Hydroxide-Simeth (MAGIC MOUTHWASH W/LIDOCAINE) SOLN 1 part 2% viscous lidocaine, 1 part water. Swish, gargle, and/or swallow 41mL QID, prn sore mouth/throat. Use 58min before meals & bedtime. 08/08/13   Eppie Gibson, MD  aspirin EC 81 MG tablet Take 81 mg by mouth daily. resume    Historical Provider, MD  Cholecalciferol (VITAMIN D-3  PO) Take 1,000 Units by mouth daily.    Historical Provider, MD  diphenhydrAMINE (BENADRYL) 25 MG tablet Take 1 tablet (25 mg total) by mouth every 6 (six) hours. 10/29/13   Dorie Rank, MD  olmesartan (BENICAR) 40 MG tablet Take 40 mg by mouth daily. 09/19/13   Kristine Garbe, MD  oxyCODONE-acetaminophen (PERCOCET) 10-325 MG per tablet Take 1 tablet by mouth every 6 (six) hours as needed for pain. 11/11/13   Eppie Gibson, MD  pentoxifylline (TRENTAL) 400 MG CR tablet Take 1 tab daily x 1 week, then take 1  tab BID thereafter 01/22/14   Eppie Gibson, MD  polyethylene glycol [facility] / Floria Raveling) packet Take 17 g by mouth daily as needed (for constipation).     Historical Provider, MD  spironolactone (ALDACTONE) 25 MG tablet Take 25 mg by mouth every morning.  09/18/11   Debbe Odea, MD  tadalafil (CIALIS) 20 MG tablet Take 20 mg by mouth daily as needed for erectile dysfunction.     Historical Provider, MD  vitamin E (VITAMIN E) 400 UNIT capsule Take 1 cap daily x 1 week, then take 1 cap BID thereafter 01/22/14   Eppie Gibson, MD   BP 95/64 mmHg  Pulse 132  Temp(Src) 96.4 F (35.8 C) (Axillary)  Resp 8  SpO2 96% Physical Exam  Constitutional: He is oriented to person, place, and time. He appears well-developed and well-nourished.  HENT:  Head: Normocephalic and atraumatic.  Pale mucous membranes  Eyes: Conjunctivae and EOM are normal. Pupils are equal, round, and reactive to light. Right eye exhibits no discharge. Left eye exhibits no discharge. No scleral icterus.  Neck: Normal range of motion. Neck supple. No JVD present.  Cardiovascular: Regular rhythm and normal heart sounds.  Exam reveals no gallop and no friction rub.   No murmur heard. Tachycardic  Pulmonary/Chest: Effort normal and breath sounds normal. No respiratory distress. He has no wheezes. He has no rales. He exhibits no tenderness.  Abdominal: Soft. He exhibits no distension and no mass. There is no tenderness. There is no rebound and no guarding.  No focal abdominal tenderness, no RLQ tenderness or pain at McBurney's point, no RUQ tenderness or Murphy's sign, no left-sided abdominal tenderness, no fluid wave, or signs of peritonitis   Genitourinary:  Chaperone present for rectal exam, gross dark red blood on gloved finger, no palpable hemorrhoids or masses  Musculoskeletal: Normal range of motion. He exhibits no edema or tenderness.  Neurological: He is alert and oriented to person, place, and time.  Skin: Skin is warm and  dry. There is pallor.  Psychiatric: He has a normal mood and affect. His behavior is normal. Judgment and thought content normal.  Nursing note and vitals reviewed.   ED Course  Procedures (including critical care time) Results for orders placed or performed during the hospital encounter of 04/08/14  CBC with Differential/Platelet  Result Value Ref Range   WBC 6.3 4.0 - 10.5 K/uL   RBC 3.66 (L) 4.22 - 5.81 MIL/uL   Hemoglobin 10.4 (L) 13.0 - 17.0 g/dL   HCT 30.0 (L) 39.0 - 52.0 %   MCV 82.0 78.0 - 100.0 fL   MCH 28.4 26.0 - 34.0 pg   MCHC 34.7 30.0 - 36.0 g/dL   RDW 17.2 (H) 11.5 - 15.5 %   Platelets 245 150 - 400 K/uL   Neutrophils Relative % 77 43 - 77 %   Neutro Abs 4.9 1.7 - 7.7 K/uL   Lymphocytes Relative 15  12 - 46 %   Lymphs Abs 0.9 0.7 - 4.0 K/uL   Monocytes Relative 5 3 - 12 %   Monocytes Absolute 0.3 0.1 - 1.0 K/uL   Eosinophils Relative 3 0 - 5 %   Eosinophils Absolute 0.2 0.0 - 0.7 K/uL   Basophils Relative 0 0 - 1 %   Basophils Absolute 0.0 0.0 - 0.1 K/uL  Comprehensive metabolic panel  Result Value Ref Range   Sodium 138 135 - 145 mmol/L   Potassium 3.7 3.5 - 5.1 mmol/L   Chloride 106 96 - 112 mmol/L   CO2 24 19 - 32 mmol/L   Glucose, Bld 164 (H) 70 - 99 mg/dL   BUN 13 6 - 23 mg/dL   Creatinine, Ser 1.17 0.50 - 1.35 mg/dL   Calcium 8.4 8.4 - 10.5 mg/dL   Total Protein 6.0 6.0 - 8.3 g/dL   Albumin 3.5 3.5 - 5.2 g/dL   AST 17 0 - 37 U/L   ALT 10 0 - 53 U/L   Alkaline Phosphatase 111 39 - 117 U/L   Total Bilirubin 0.7 0.3 - 1.2 mg/dL   GFR calc non Af Amer 66 (L) >90 mL/min   GFR calc Af Amer 77 (L) >90 mL/min   Anion gap 8 5 - 15  I-stat chem 8, ed  Result Value Ref Range   Sodium 141 135 - 145 mmol/L   Potassium 3.7 3.5 - 5.1 mmol/L   Chloride 102 96 - 112 mmol/L   BUN 14 6 - 23 mg/dL   Creatinine, Ser 1.20 0.50 - 1.35 mg/dL   Glucose, Bld 161 (H) 70 - 99 mg/dL   Calcium, Ion 1.12 (L) 1.13 - 1.30 mmol/L   TCO2 20 0 - 100 mmol/L   Hemoglobin 10.2  (L) 13.0 - 17.0 g/dL   HCT 30.0 (L) 39.0 - 52.0 %  POC occult blood, ED  Result Value Ref Range   Fecal Occult Bld POSITIVE (A) NEGATIVE  Type and screen  Result Value Ref Range   ABO/RH(D) O POS    Antibody Screen NEG    Sample Expiration 04/11/2014   Prepare RBC  Result Value Ref Range   Order Confirmation ORDER PROCESSED BY BLOOD BANK   ABO/Rh  Result Value Ref Range   ABO/RH(D) O POS    No results found.     EKG Interpretation None      MDM   Final diagnoses:  Rectal bleeding    Patient with rectal bleeding, and likely symptomatic anemia. Will order type and screen, and appeared to transfuse 2 units. Anticipate that he will be quite anemic. We'll check basic labs, and will reassess. Heart rate is in the 130s, blood pressure is 90s over 60s. Patient is alert and oriented. Denies feeling dizzy. Will give fluids.  Patient discussed with Dr. Stark Jock, who agrees with plan.  Will consult gastroenterology.  Will give blood despite non-critical Hgb.  I suspect that the hgb is lagging behind clinical symptoms given tachycardia and hypotension.  8:06 PM Patient discussed with Dr. Oletta Lamas from GI.  Recommends protonix drip and medicine admit.  States that he will add the patient to the list, but it will likely be tomorrow morning.  Advises that if patient's condition worsens, call him back.  9:04 PM Heart rate has decreased to 109 with fluids, blood pressure is holding stable in the 110s.  Patient is alert and oriented. Patient discussed with family practice residents, who will admit the patient.  Critical  for GI bleed with hypotension, tachycardia to 130s.  CRITICAL CARE Performed by: Montine Circle   Total critical care time: 50  Critical care time was exclusive of separately billable procedures and treating other patients.  Critical care was necessary to treat or prevent imminent or life-threatening deterioration.  Critical care was time spent personally by me on the  following activities: development of treatment plan with patient and/or surrogate as well as nursing, discussions with consultants, evaluation of patient's response to treatment, examination of patient, obtaining history from patient or surrogate, ordering and performing treatments and interventions, ordering and review of laboratory studies, ordering and review of radiographic studies, pulse oximetry and re-evaluation of patient's condition.   Montine Circle, PA-C 04/08/14 2112  Veryl Speak, MD 04/08/14 [phone]

## 2014-04-09 DIAGNOSIS — D649 Anemia, unspecified: Secondary | ICD-10-CM

## 2014-04-09 DIAGNOSIS — K921 Melena: Secondary | ICD-10-CM

## 2014-04-09 LAB — BASIC METABOLIC PANEL
Anion gap: 5 (ref 5–15)
BUN: 12 mg/dL (ref 6–23)
CHLORIDE: 107 mmol/L (ref 96–112)
CO2: 27 mmol/L (ref 19–32)
Calcium: 8.8 mg/dL (ref 8.4–10.5)
Creatinine, Ser: 0.95 mg/dL (ref 0.50–1.35)
GFR calc Af Amer: >90 mL/min (ref >90)
GFR, EST NON AFRICAN AMERICAN: 89 mL/min — AB (ref >90)
Glucose, Bld: 116 mg/dL — ABNORMAL HIGH (ref 70–99)
POTASSIUM: 4 mmol/L (ref 3.5–5.1)
Sodium: 139 mmol/L (ref 135–145)

## 2014-04-09 LAB — CBC
HEMATOCRIT: 30.5 % — AB (ref 39.0–52.0)
Hemoglobin: 10.6 g/dL — ABNORMAL LOW (ref 13.0–17.0)
MCH: 28.3 pg (ref 26.0–34.0)
MCHC: 34.8 g/dL (ref 30.0–36.0)
MCV: 81.6 fL (ref 78.0–100.0)
Platelets: 195 10*3/uL (ref 150–400)
RBC: 3.74 MIL/uL — ABNORMAL LOW (ref 4.22–5.81)
RDW: 17.5 % — AB (ref 11.5–15.5)
WBC: 7.6 10*3/uL (ref 4.0–10.5)

## 2014-04-09 LAB — ABO/RH: ABO/RH(D): O POS

## 2014-04-09 MED ORDER — POLYETHYLENE GLYCOL 3350 17 G PO PACK
17.0000 g | PACK | Freq: Three times a day (TID) | ORAL | Status: DC
  Administered 2014-04-09 – 2014-04-10 (×3): 17 g via ORAL
  Filled 2014-04-09 (×8): qty 1

## 2014-04-09 NOTE — Consult Note (Signed)
[facility] CONSULT Reason for consult: G.I. bleeding Referring Physician: Family Medicine Teaching Service  Melvin Hudson is an 59 y.o. male.  HPI: he is currently being treated for squamous carcinoma the right tonsil. He reports to me that he had a colonoscopy done about 5 years ago in [place]. At that time he was not told of any significant findings but that he needed to have the procedure repeated in 7 years. To the best of his knowledge, he is not had any diverticular disease. One year ago he had one day of rectal bleeding after bowel movement that subsided. He was admitted from the ER with multiple bloody bowel movements without preceding abdominal pain. He has received 2 units of blood because he was tachycardic and slightly hypotensive. He has not seen any additional bowel movements in the past few hours. He denies abdominal pain. Beside the malignant neoplasm of the tonsil he has had a history of hypertension obesity in sleep apnea  Past Medical History  Diagnosis Date  . Hypertension   . Angioedema 09/17/11    tongue and lips  . Obesity   . Hx of gout   . S/P radiation therapy 07/26/2013-09/15/2013    Right tonsil/bilateral neck/ 7000 cGy  . 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 Arthritis     "both of my legs and feet" (01/05/2014)  . OSA on CPAP     Past Surgical History  Procedure Laterality Date  . Knee arthroscopy Right ~ 1994  . Multiple tooth extractions  ~ 2008  . Multiple extractions with alveoloplasty N/A 07/08/2013    Procedure: Extraction of tooth #'s 22, 27 with alveoloplasty and bilateal mandibular facial exostoses reductions;  Surgeon: Lenn Cal, DDS;  Location: [facility];  Service: Oral Surgery;  Laterality: N/A;  . Colonoscopy w/ biopsies and polypectomy      benign  . Direct laryngoscopy  01/05/2014  . Direct laryngoscopy N/A 01/05/2014    Procedure: DIRECT LARYNGOSCOPY AND BIOPSY;  Surgeon: [name], MD;  Location: [facility];   Service: ENT;  Laterality: N/A;  . Left and right heart catheterization with coronary/graft angiogram N/A 12/31/2010    Procedure: LEFT AND RIGHT HEART CATHETERIZATION WITH Beatrix Fetters;  Surgeon: Laverda Page, MD;  Location[facility] CATH LAB;  Service: Cardiovascular;  Laterality: N/A;    Family History  Problem Relation Age of Onset  . Cancer Sister     breast ca    Social History:  reports that he quit smoking about 9 months ago. His smoking use included Cigarettes and Cigars. He quit after 42 years of use. He has never used smokeless tobacco. He reports that he drinks alcohol. He reports that he does not use illicit drugs.  Allergies:  Allergies  Allergen Reactions  . Advil [Ibuprofen]     Feels "jittery"  . Azilsartan Swelling    Avoid ARB and ACEI per MD    Medications; Prior to Admission medications   Medication Sig Start Date End Date Taking? Authorizing Provider  aspirin EC 81 MG tablet Take 81 mg by mouth daily. resume   Yes Historical Provider, MD  Cholecalciferol (VITAMIN D-3 PO) Take 1,000 Units by mouth daily.   Yes Historical Provider, MD  olmesartan (BENICAR) 40 MG tablet Take 40 mg by mouth daily. 09/19/13  Yes Kristine Garbe, MD  oxyCODONE-acetaminophen (PERCOCET) 10-325 MG per tablet Take 1 tablet by mouth every 6 (six) hours as needed for pain. 11/11/13  Yes Eppie Gibson, MD  pentoxifylline (TRENTAL) 400 MG CR tablet Take 1 tab daily x 1 week, then take 1 tab BID thereafter 01/22/14  Yes Eppie Gibson, MD  polyethylene glycol [facility] Floria Raveling) packet Take 17 g by mouth daily as needed (for constipation).    Yes Historical Provider, MD  spironolactone (ALDACTONE) 25 MG tablet Take 25 mg by mouth every morning.  09/18/11  Yes Debbe Odea, MD  tadalafil (CIALIS) 20 MG tablet Take 20 mg by mouth daily as needed for erectile dysfunction.    Yes Historical Provider, MD  vitamin E (VITAMIN E) 400 UNIT capsule Take 1 cap daily x 1 week, then take 1 cap BID  thereafter 01/22/14  Yes Eppie Gibson, MD  Alum & Mag Hydroxide-Simeth (MAGIC MOUTHWASH W/LIDOCAINE) SOLN 1 part 2% viscous lidocaine, 1 part water. Swish, gargle, and/or swallow 40m QID, prn sore mouth/throat. Use 315m before meals & bedtime. Patient not taking: Reported on 04/08/2014 08/08/13   SaEppie GibsonMD  diphenhydrAMINE (BENADRYL) 25 MG tablet Take 1 tablet (25 mg total) by mouth every 6 (six) hours. Patient not taking: Reported on 04/08/2014 10/29/13   JoDorie RankMD   . sodium chloride   Intravenous Once  . pantoprazole (PROTONIX) IV  40 mg Intravenous Q12H  . sodium chloride  3 mL Intravenous Q12H  . vitamin E  400 Units Oral Daily   PRN Meds acetaminophen **OR** acetaminophen, antiseptic oral rinse, magic mouthwash w/lidocaine, ondansetron **OR** ondansetron (ZOFRAN) IV, oxyCODONE-acetaminophen **AND** oxyCODONE, traZODone Results for orders placed or performed during the hospital encounter of 04/08/14 (from the past 48 hour(s))  CBC with Differential/Platelet     Status: Abnormal   Collection Time: 04/08/14  7:27 PM  Result Value Ref Range   WBC 6.3 4.0 - 10.5 K/uL   RBC 3.66 (L) 4.22 - 5.81 MIL/uL   Hemoglobin 10.4 (L) 13.0 - 17.0 g/dL   HCT 30.0 (L) 39.0 - 52.0 %   MCV 82.0 78.0 - 100.0 fL   MCH 28.4 26.0 - 34.0 pg   MCHC 34.7 30.0 - 36.0 g/dL   RDW 17.2 (H) 11.5 - 15.5 %   Platelets 245 150 - 400 K/uL   Neutrophils Relative % 77 43 - 77 %   Neutro Abs 4.9 1.7 - 7.7 K/uL   Lymphocytes Relative 15 12 - 46 %   Lymphs Abs 0.9 0.7 - 4.0 K/uL   Monocytes Relative 5 3 - 12 %   Monocytes Absolute 0.3 0.1 - 1.0 K/uL   Eosinophils Relative 3 0 - 5 %   Eosinophils Absolute 0.2 0.0 - 0.7 K/uL   Basophils Relative 0 0 - 1 %   Basophils Absolute 0.0 0.0 - 0.1 K/uL  Comprehensive metabolic panel     Status: Abnormal   Collection Time: 04/08/14  7:27 PM  Result Value Ref Range   Sodium 138 135 - 145 mmol/L   Potassium 3.7 3.5 - 5.1 mmol/L   Chloride 106 96 - 112 mmol/L   CO2  24 19 - 32 mmol/L   Glucose, Bld 164 (H) 70 - 99 mg/dL   BUN 13 6 - 23 mg/dL   Creatinine, Ser 1.17 0.50 - 1.35 mg/dL   Calcium 8.4 8.4 - 10.5 mg/dL   Total Protein 6.0 6.0 - 8.3 g/dL   Albumin 3.5 3.5 - 5.2 g/dL   AST 17 0 - 37 U/L   ALT 10 0 - 53 U/L   Alkaline Phosphatase 111 39 - 117 U/L   Total Bilirubin 0.7 0.3 -  1.2 mg/dL   GFR calc non Af Amer 66 (L) >90 mL/min   GFR calc Af Amer 77 (L) >90 mL/min    Comment: (NOTE) The eGFR has been calculated using the CKD EPI equation. This calculation has not been validated in all clinical situations. eGFR's persistently <90 mL/min signify possible Chronic Kidney Disease.    Anion gap 8 5 - 15  Type and screen     Status: None (Preliminary result)   Collection Time: 04/08/14  7:27 PM  Result Value Ref Range   ABO/RH(D) O POS    Antibody Screen NEG    Sample Expiration 04/11/2014    Unit Number [id number]    Blood Component Type RED CELLS,LR    Unit division 00    Status of Unit ISSUED    Transfusion Status OK TO TRANSFUSE    Crossmatch Result Compatible    Unit Number [id number]    Blood Component Type RED CELLS,LR    Unit division 00    Status of Unit ISSUED,FINAL    Transfusion Status OK TO TRANSFUSE    Crossmatch Result Compatible   Prepare RBC     Status: None   Collection Time: 04/08/14  7:27 PM  Result Value Ref Range   Order Confirmation ORDER PROCESSED BY BLOOD BANK   ABO/Rh     Status: None   Collection Time: 04/08/14  7:27 PM  Result Value Ref Range   ABO/RH(D) O POS   I-stat chem 8, ed     Status: Abnormal   Collection Time: 04/08/14  7:35 PM  Result Value Ref Range   Sodium 141 135 - 145 mmol/L   Potassium 3.7 3.5 - 5.1 mmol/L   Chloride 102 96 - 112 mmol/L   BUN 14 6 - 23 mg/dL   Creatinine, Ser 1.20 0.50 - 1.35 mg/dL   Glucose, Bld 161 (H) 70 - 99 mg/dL   Calcium, Ion 1.12 (L) 1.13 - 1.30 mmol/L   TCO2 20 0 - 100 mmol/L   Hemoglobin 10.2 (L) 13.0 - 17.0 g/dL   HCT 30.0 (L) 39.0 - 52.0 %  POC  occult blood, ED     Status: Abnormal   Collection Time: 04/08/14  7:41 PM  Result Value Ref Range   Fecal Occult Bld POSITIVE (A) NEGATIVE    No results found.             Blood pressure 99/73, pulse 80, temperature 98.1 F (36.7 C), temperature source Oral, resp. rate 16, height 6' 1"  (1.854 m), weight 136.124 kg (300 lb 1.6 oz), SpO2 99 %.  Physical exam:   General--obese African-American male in no distress. CPAP machine in place   Neck-- quite obese Heart-- regular rate and rhythm without murmurs are gallops Lungs--clear no shortness of breath or dyspnea Abdomen-- quite obese nontender  Psych-- alert and oriented and appropriate Assessment: 1. G.I. bleed. Probably lower G.I. based on the symptoms and normal BUN creatinine. I suspect this is a diverticular bleed. He has a history of colonoscopy 5 years ago findings unclear 2. Squamous cell carcinoma the tonsil undergoing therapy 3. Obesity with sleep apnea requiring CPAP  Plan: 1. Would go ahead and keep him on clear liquids and begin Miralax. Hopefully his bleeding will clear and I think he should probably have his colonoscopy repeated.  We will follow with you.   Alanny Rivers JR,Ariel Dimitri L 04/09/2014, 9:02 AM

## 2014-04-09 NOTE — Discharge Summary (Signed)
[facility] Discharge Summary  Patient name: Melvin Hudson Medical record number: [id number] Date of birth: Jun 20, 1955 Age: 59 y.o. Gender: male Date of Admission: 04/08/2014  Date of Discharge: 04/16/14 Admitting Physician: Lind Covert, MD  Primary Care Provider: Kristine Garbe, MD Consultants: GI  Indication for Hospitalization: GI bleed, symptomatic anemia  Discharge Diagnoses/Problem List:  Hematochezia HTN OSA H/o Malignant neoplasm of tonsil Symptomatic anemia  Disposition: Discharge home  Discharge Condition: Stable  Discharge Exam: as performed by Dr Raoul Pitch Temp: [97.9 F (36.6 C)-98.9 F (37.2 C)] 98 F (36.7 C) (02/27 0523) Pulse Rate: [69-84] 69 (02/27 0523) Resp: [16-18] 18 (02/27 0523) BP: (116-146)/(81-94) 144/88 mmHg (02/27 0523) SpO2: [95 %-100 %] 100 % (02/27 0523) Physical Exam: General: awake, alert, sitting up at bedside having breakfast, NAD HENT: [place]/AT, EOMI, pale conjunctiva, o/p poor dentition. Cardiovascular: RRR, no m/r/g Respiratory: CTAB, no increased WOB Abdomen: obese, soft, NT/ND, +BS Extremities: WWP, no edema Neuro: AOx3, speech normal, no focal deficits, follows commands  Brief Hospital Course:  Melvin Hudson is a 59 y.o. male that presented with GI bleed x2 days and fatigue x1 day . PMH is significant for Malignant neoplasm of tonsil, HTN, OSA, tobacco abuse  On admission patient with hgb to 10.2.  In the setting of multiple large, bloody BMs, the patient was transfused with 2u pRBCs.  Patient's hgb responded minimally increasing only to 10.6.  He was evaluated by GI who recommended continuing a clear liquid diet and treating with Miralax with anticipation of a colonoscopy while inpatient.  Patient's CBC was monitored closely.  On HD#3 patient's hgb decreased again to 8.5, repeat later that after noon down to 7.8.  He was transfused again with 2 units pRBCs.  Hgb did not respond as anticipated,  increasing to only 8.3.  Patient underwent colonoscopy and was found to have an active lower GI tract bleed.  GI suspected diverticular bleeding in region of sigmoid and descending colon.  He unfortunately was hemodynamically unstable in the setting of active GI bleed and was transferred directly from the GI suite to the ICU.  Patient was again transfused with another unit of blood.  He was further evaluated with a mesenteric arteriogram.  No active bleeding was appreciated on this study.  Recommendation was made for tagged RBC study, which was not able to localize an active bleed.  Patient was able to be transferred back to the [facility] a day later.  Again, his hemoglobin was monitored and showed improvement.  Upon discharge, patient's hgb was 9.0.  He was no longer symptomatic and was discharged with instructions for close follow up with his PCP and GI for possible repeat colonoscopy.  Patient voiced good understanding of these instructions.  Issues for Follow Up:  1. Repeat CBC, monitor hgb 2. Consider anemia panel 3. Consider starting on Iron daily.  Significant Procedures: transfusion of pRBCs (9 units total), transfusion of FFPs (2 units total), Colonoscopy, tagged RBC study, mesenteric angiogram  Significant Labs and Imaging:   Recent Labs Lab 04/14/14 1500 04/15/14 0501 04/15/14 1610  WBC 6.1 4.8 5.2  HGB 9.0* 8.0* 9.0*  HCT 26.8* 24.0* 27.3*  PLT 176 156 193    Recent Labs Lab 04/09/14 1125 04/12/14 0829 04/13/14 0930 04/14/14 0206 04/15/14 0501  NA 139 140 139 138 137  K 4.0 3.2* 3.1* 3.4* 3.3*  CL 107 107 106 104 106  CO2 27 30 30 28 27   GLUCOSE 116* 100* 110* 99 123*  BUN  12 <5* <5* 5* 7  CREATININE 0.95 0.96 0.97 1.07 1.09  CALCIUM 8.8 7.9* 8.3* 8.1* 7.9*   Colonoscopy:  ENDOSCOPIC IMPRESSION: Active lower GI tract bleeding. Suspect diverticular bleeding in region of sigmoid and descending colon.  No results found.  Results/Tests Pending at Time of Discharge:  none  Discharge Medications:    Medication List    STOP taking these medications        aspirin EC 81 MG tablet      TAKE these medications        diphenhydrAMINE 25 MG tablet  Commonly known as:  BENADRYL  Take 1 tablet (25 mg total) by mouth every 6 (six) hours.     magic mouthwash w/lidocaine Soln  1 part 2% viscous lidocaine, 1 part water. Swish, gargle, and/or swallow 92mL QID, prn sore mouth/throat. Use 78min before meals & bedtime.     olmesartan 40 MG tablet  Commonly known as:  BENICAR  Take 40 mg by mouth daily.     oxyCODONE-acetaminophen 10-325 MG per tablet  Commonly known as:  PERCOCET  Take 1 tablet by mouth every 6 (six) hours as needed for pain.     pentoxifylline 400 MG CR tablet  Commonly known as:  TRENTAL  Take 1 tab daily x 1 week, then take 1 tab BID thereafter     polyethylene glycol packet  Commonly known as:  MIRALAX / GLYCOLAX  Take 17 g by mouth daily as needed (for constipation).     spironolactone 25 MG tablet  Commonly known as:  ALDACTONE  Take 25 mg by mouth every morning.     tadalafil 20 MG tablet  Commonly known as:  CIALIS  Take 20 mg by mouth daily as needed for erectile dysfunction.     VITAMIN D-3 PO  Take 1,000 Units by mouth daily.     vitamin E 400 UNIT capsule  Commonly known as:  vitamin E  Take 1 cap daily x 1 week, then take 1 cap BID thereafter        Discharge Instructions: Please refer to Patient Instructions section of EMR for full details.  Patient was counseled important signs and symptoms that should prompt return to medical care, changes in medications, dietary instructions, activity restrictions, and follow up appointments.   Follow-Up Appointments: Follow-up Information    Follow up with REESE,BETTI D, MD. Schedule an appointment as soon as possible for a visit in 1 week.   Specialty:  Family Medicine   Why:  hospital follow up   Contact information:   [place]  [id number] [phone]       Follow up with [place].   Why:  3n1 and RW arranged - to be delivered to room prior to discharge   Contact information:   [address] [place] [address] [phone]       Follow up with REESE,BETTI D, MD. Schedule an appointment as soon as possible for a visit in 1 week.   Specialty:  Family Medicine   Contact information:   [address] [address] [phone]       Camron Essman M Ragan Duhon, DO 04/16/2014, 7:28 AM PGY-1, [place]

## 2014-04-09 NOTE — Progress Notes (Signed)
Admission note:  Arrival Method: Pt arrived on stretcher from ED Mental Orientation: Alert and oriented x 4 Telemetry: Telemetry box 26 placed, CCMD notified. Pt running normal sinus tach Assessment: Completed, see flowsheets Skin: Cracking on heels bilaterally. Dry and intact IV: IV in right AC and left hand. Clean dry and intact Pain: Stated pain is 7/10. Pain medication administered Tubes: IV tubing and SCD tubing secured Safety Measures: Moderate fall risk. Socks placed, call light within reach Fall Prevention Safety Plan: Reviewed with pt Admission Screening: Comp[place]ed 6700 Orientation: Patient has been oriented to the unit, staff and to the room.  Pt lying in bed comfortably with no needs stated at this time. Will continue to monitor.  Shelbie Hutching, RN

## 2014-04-09 NOTE — Progress Notes (Signed)
PT Cancellation Note  Patient Details Name: Melvin Hudson MRN: [id number] DOB: 07/27/1955   Cancelled Treatment:    Reason Eval/Treat Not Completed: Patient not medically ready.  Pt currently on bedrest.  Please update activity order, when appropriate, for PT to proceed with evaluation.   Lorriane Shire 04/09/2014, 8:39 AM

## 2014-04-09 NOTE — Progress Notes (Signed)
Placed patient on CPAP for the night via auto-mode with minimum pressure set at 5cm and maximum pressure ser at 20cm

## 2014-04-09 NOTE — Progress Notes (Signed)
Family Medicine Teaching Service Daily Progress Note Intern Pager: [phone]  Patient name: Melvin Hudson Medical record number: [id number] Date of birth: March 23, 1955 Age: 59 y.o. Gender: male  Primary Care Provider: Kristine Garbe, MD Consultants: GI Code Status: FULL  Pt Overview and Major Events to Date:  2/20: Transfused 2u pRBCs  Assessment and Plan: Melvin Hudson is a 59 y.o. male presenting with GI bleed x2 days and fatigue x1 day . PMH is significant for Malignant neoplasm of tonsil, HTN, OSA, tobacco abuse  Melena/GI Bleed: Hgb 10.4>10.2 in ED. Patient with known h/o squamous cell carcinoma of the Right tonsil. Last colonoscopy several years ago. FOBT+. Currently getting 2nd unit of pRBCs.  -telemetry -Vitals per floor protocol -Post transfusion CBC this morning -Repeat CBC this afternoon, then plan Q 8 CBCs until stablizes -c/s GI: Dr Oletta Lamas to see today -Will FYI Oncology after GI evaluation -Protonix BID -Tylenol PRN, Zofran PRN  -Repeat EKG this morning -NPO to prepare for procedures if recommended by GI.   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 Sees Dr Isidore Moos for this. CT neck/chest scheduled for April 2016. Biopsy in 12/2013 negative for dysplasia or malignancy.  -Hold home Trental in setting of GI bleed. -Biotene, Vitamin E, MMW  HTN: hypotensive on admission. BP currently 92/61. HR 91. On aldactone, benicar at home.  -Will hold home BP medications in setting of initial soft BPs and current normotensive state -Consider adding back BP medications if BP elevates  OSA: CPAP at home -CPAP QHS   Tobacco abuse: not currently a smoker -No nicotine patches needed  FEN/GI: NS @125cc /h, NPO MDN, PPI BID Prophylaxis: SCDs in setting of GI bleed  Disposition: Discharge home pending evaluation by GI and resolution of symptoms  Subjective:  Patient reports that he is feeling well this morning.  He reports that he had 2 more bloody stools  after I saw him last evening.  He endorsed some mild abdominal cramping after those stools, that has since resolved.  Denies dizziness, weakness, N/V, abdominal pain, CP, SOB.    We had an extensive conversation regarding social support at home.  Patient lives at home alone and is on disability.  He reports that he has a home aid that comes in to help with cleaning a few days a week.  He also has a couple of siblings that live in [place], whom he is close with.  Objective: Temp:  [96.4 F (35.8 C)-97.9 F (36.6 C)] 97.9 F (36.6 C) (02/21 0500) Pulse Rate:  [83-132] 91 (02/21 0500) Resp:  [8-28] 18 (02/21 0500) BP: (92-119)/(57-93) 92/61 mmHg (02/21 0500) SpO2:  [96 %-100 %] 99 % (02/21 0500) Weight:  [300 lb 1.6 oz (136.124 kg)] 300 lb 1.6 oz (136.124 kg) (02/20 2225) Physical Exam: General: sleeping in bed, CPAP in place, NAD, pRBCs still running Cardiovascular: RRR, no m/r/g Respiratory: CTAB, no increased WOB Abdomen: obese, soft, NT/ND, +BS Extremities: WWP, no edema Neuro: AOx3, follows commands, no focal deficits  Laboratory:  Recent Labs Lab 04/08/14 1927 04/08/14 1935  WBC 6.3  --   HGB 10.4* 10.2*  HCT 30.0* 30.0*  PLT 245  --     Recent Labs Lab 04/08/14 1927 04/08/14 1935  NA 138 141  K 3.7 3.7  CL 106 102  CO2 24  --   BUN 13 14  CREATININE 1.17 1.20  CALCIUM 8.4  --   PROT 6.0  --   BILITOT 0.7  --   ALKPHOS 111  --  ALT 10  --   AST 17  --   GLUCOSE 164* 161*    Imaging/Diagnostic Tests: No results found.   Janora Norlander, DO 04/09/2014, 6:20 AM PGY-1, [place] Intern pager: [phone], text pages welcome

## 2014-04-09 NOTE — Progress Notes (Signed)
Utilization review completed.  

## 2014-04-10 DIAGNOSIS — K921 Melena: Secondary | ICD-10-CM

## 2014-04-10 LAB — CBC
HCT: 24.7 % — ABNORMAL LOW (ref 39.0–52.0)
HCT: 22.9 % — ABNORMAL LOW (ref 39.0–52.0)
Hemoglobin: 8.5 g/dL — ABNORMAL LOW (ref 13.0–17.0)
Hemoglobin: 7.8 g/dL — ABNORMAL LOW (ref 13.0–17.0)
MCH: 28.1 pg (ref 26.0–34.0)
MCH: 28.5 pg (ref 26.0–34.0)
MCHC: 34.4 g/dL (ref 30.0–36.0)
MCHC: 34.1 g/dL (ref 30.0–36.0)
MCV: 81.5 fL (ref 78.0–100.0)
MCV: 83.6 fL (ref 78.0–100.0)
PLATELETS: 196 10*3/uL (ref 150–400)
PLATELETS: 180 10*3/uL (ref 150–400)
RBC: 3.03 MIL/uL — AB (ref 4.22–5.81)
RBC: 2.74 MIL/uL — ABNORMAL LOW (ref 4.22–5.81)
RDW: 17.7 % — ABNORMAL HIGH (ref 11.5–15.5)
RDW: 17.7 % — AB (ref 11.5–15.5)
WBC: 7.4 10*3/uL (ref 4.0–10.5)
WBC: 7.4 10*3/uL (ref 4.0–10.5)

## 2014-04-10 LAB — PREPARE RBC (CROSSMATCH)

## 2014-04-10 MED ORDER — SODIUM CHLORIDE 0.9 % IV SOLN
Freq: Once | INTRAVENOUS | Status: AC
  Administered 2014-04-10: 18:00:00 via INTRAVENOUS

## 2014-04-10 MED ORDER — PEG 3350-KCL-NA BICARB-NACL 420 G PO SOLR
4000.0000 mL | Freq: Once | ORAL | Status: AC
  Administered 2014-04-10: 4000 mL via ORAL
  Filled 2014-04-10: qty 4000

## 2014-04-10 MED ORDER — BISACODYL 5 MG PO TBEC: 10.0000 mg | DELAYED_RELEASE_TABLET | Freq: Once | ORAL | Status: DC

## 2014-04-10 NOTE — Progress Notes (Signed)
Subjective: Had more blood in stool. Mild left-sided abdominal pain.  Objective: Vital signs in last 24 hours: Temp:  [97.5 F (36.4 C)-98.8 F (37.1 C)] 98.7 F (37.1 C) (02/22 0952) Pulse Rate:  [78-95] 95 (02/22 0952) Resp:  [16-20] 18 (02/22 0952) BP: (109-138)/(68-91) 116/91 mmHg (02/22 0952) SpO2:  [98 %-100 %] 100 % (02/22 0952) Weight:  [133.539 kg (294 lb 6.4 oz)] 133.539 kg (294 lb 6.4 oz) (02/21 2100) Weight change: -2.586 kg (-5 lb 11.2 oz) Last BM Date: 04/08/14  PE: GEN:  Overweight, CPAP device at bedside ABD:  Soft, protuberant, mild left-sided tenderness without peritonitis, active bowel sounds  Lab Results: CBC    Component Value Date/Time   WBC 7.4 04/10/2014 0631   WBC 10.2 06/30/2013 1437   RBC 3.03* 04/10/2014 0631   RBC 4.26 06/30/2013 1437   HGB 8.5* 04/10/2014 0631   HGB 12.3* 06/30/2013 1437   HCT 24.7* 04/10/2014 0631   HCT 35.4* 06/30/2013 1437   PLT 196 04/10/2014 0631   PLT 334 06/30/2013 1437   MCV 81.5 04/10/2014 0631   MCV 83.1 06/30/2013 1437   MCH 28.1 04/10/2014 0631   MCH 28.9 06/30/2013 1437   MCHC 34.4 04/10/2014 0631   MCHC 34.8 06/30/2013 1437   RDW 17.7* 04/10/2014 0631   RDW 16.8* 06/30/2013 1437   LYMPHSABS 0.9 04/08/2014 1927   LYMPHSABS 2.3 06/30/2013 1437   MONOABS 0.3 04/08/2014 1927   MONOABS 0.8 06/30/2013 1437   EOSABS 0.2 04/08/2014 1927   EOSABS 0.4 06/30/2013 1437   BASOSABS 0.0 04/08/2014 1927   BASOSABS 0.0 06/30/2013 1437   CMP     Component Value Date/Time   NA 139 04/09/2014 1125   NA 139 08/15/2013 1428   K 4.0 04/09/2014 1125   K 4.0 08/15/2013 1428   CL 107 04/09/2014 1125   CO2 27 04/09/2014 1125   CO2 29 08/15/2013 1428   GLUCOSE 116* 04/09/2014 1125   GLUCOSE 155* 08/15/2013 1428   BUN 12 04/09/2014 1125   BUN 14.3 08/15/2013 1428   CREATININE 0.95 04/09/2014 1125   CREATININE 1.2 08/15/2013 1428   CALCIUM 8.8 04/09/2014 1125   CALCIUM 9.8 08/15/2013 1428   PROT 6.0 04/08/2014 1927    PROT 7.3 06/30/2013 1438   ALBUMIN 3.5 04/08/2014 1927   ALBUMIN 3.9 06/30/2013 1438   AST 17 04/08/2014 1927   AST 13 06/30/2013 1438   ALT 10 04/08/2014 1927   ALT 11 06/30/2013 1438   ALKPHOS 111 04/08/2014 1927   ALKPHOS 147 06/30/2013 1438   BILITOT 0.7 04/08/2014 1927   BILITOT 0.59 06/30/2013 1438   GFRNONAA 89* 04/09/2014 1125   GFRAA >90 04/09/2014 1125   Assessment:  1.  Acute blood loss anemia, 2-gram drop in hemoglobin since yesterday. 2.  Hematochezia, seemingly lower GI tract, persistent but not destabilizing.  Last colonoscopy about 5 years ago in [place].  Plan:  1.  Supportive care with IV fluids, transfusion as needed. 2.  Clear liquids today. 3.  Colonoscopy tomorrow. 4.  Risks (bleeding, infection, bowel perforation that could require surgery, sedation-related changes in cardiopulmonary systems), benefits (identification and possible treatment of source of symptoms, exclusion of certain causes of symptoms), and alternatives (watchful waiting, radiographic imaging studies, empiric medical treatment) of colonoscopy were explained to patient/family in detail and patient wishes to proceed.   Landry Dyke 04/10/2014, 10:18 AM

## 2014-04-10 NOTE — Evaluation (Signed)
Physical Therapy Evaluation Patient Details Name: Melvin Hudson MRN: [id number] DOB: 10-Mar-1955 Today's Date: 04/10/2014   History of Present Illness  Patient is a 59 yo male admitted 04/08/14 with GIB, symptomatic anemia (hypotension and weakness).  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  Will benefit from acute PT to maximize independence prior to discharge home.  Patient unsteady with cane.  Recommend RW and 3-in-1 BSC for home use (patient 294# - may need bari BSC).  Do not anticipate any f/u PT needs.    Follow Up Recommendations No PT follow up;Supervision - Intermittent    Equipment Recommendations  Rolling walker with 5" wheels;  3in1 (PT) (May need bari 3-in-1 - patient 294 pounds)    Recommendations for Other Services       Precautions / Restrictions Precautions Precautions: Fall Restrictions Weight Bearing Restrictions: No      Mobility  Bed Mobility                  Transfers Overall transfer level: Modified independent Equipment used: Rolling walker (2 wheeled)             General transfer comment: Verbal cues for hand placement and technique.  No physical assist needed.  Ambulation/Gait Ambulation/Gait assistance: Supervision Ambulation Distance (Feet): 92 Feet Assistive device: Rolling walker (2 wheeled) Gait Pattern/deviations: Step-through pattern;Decreased stride length;Trunk flexed Gait velocity: Decreased Gait velocity interpretation: Below normal speed for age/gender General Gait Details: Verbal cues for safe use of RW.  Cues to stand upright with gait.  Good balance with RW.  Stairs            Wheelchair Mobility    Modified Rankin (Stroke Patients Only)       Balance                                             Pertinent Vitals/Pain Pain Assessment: 0-10 Pain Score: 7  Pain Location: BLE's  (from arthritis per patient) Pain Descriptors / Indicators: Aching;Constant;Heaviness;Discomfort Pain Intervention(s): Limited activity within patient's tolerance;Repositioned    Home Living Family/patient expects to be discharged to:: Private residence Living Arrangements: Alone Available Help at Discharge: Friend(s);Neighbor;Personal care attendant;Available PRN/intermittently (Aide 5x/week for bathing, meals, housekeeping) Type of Home: Apartment Home Access: Level entry     Home Layout: One level Home Equipment: Cane - single point      Prior Function Level of Independence: Independent with assistive device(s);Needs assistance   Gait / Transfers Assistance Needed: Using cane - reports still unsteady  ADL's / Homemaking Assistance Needed: Assist for bathing, meals, and housekeeping (Aide)        Hand Dominance        Extremity/Trunk Assessment   Upper Extremity Assessment: Overall WFL for tasks assessed           Lower Extremity Assessment: Generalized weakness      Cervical / Trunk Assessment: Normal  Communication   Communication: No difficulties  Cognition Arousal/Alertness: Awake/alert Behavior During Therapy: WFL for tasks assessed/performed Overall Cognitive Status: Within Functional Limits for tasks assessed                      General Comments      Exercises        Assessment/Plan  PT Assessment Patient needs continued PT services  PT Diagnosis Difficulty walking;Generalized weakness;Acute pain   PT Problem List Decreased strength;Decreased activity tolerance;Decreased balance;Decreased mobility;Decreased knowledge of use of DME;Obesity;Pain  PT Treatment Interventions DME instruction;Gait training;Functional mobility training;Therapeutic activities;Therapeutic exercise;Patient/family education   PT Goals (Current goals can be found in the Care Plan section) Acute Rehab PT Goals Patient Stated Goal: To get stronger PT Goal  Formulation: With patient Time For Goal Achievement: 04/17/14 Potential to Achieve Goals: Good    Frequency Min 3X/week   Barriers to discharge Decreased caregiver support Does not have 24 hour assist    Co-evaluation               End of Session Equipment Utilized During Treatment: Gait belt Activity Tolerance: Patient tolerated treatment well;Patient limited by pain Patient left: in bed;with call bell/phone within reach (sitting EOB) Nurse Communication: Mobility status         Time: 6553-7482 PT Time Calculation (min) (ACUTE ONLY): 16 min   Charges:   PT Evaluation $Initial PT Evaluation Tier I: 1 Procedure     PT G CodesDespina Pole 2014/05/03, 2:09 PM Carita Pian. Sanjuana Kava, [employer] Pager [phone]

## 2014-04-10 NOTE — Progress Notes (Signed)
Family Medicine Teaching Service Daily Progress Note Intern Pager: [phone]  Patient name: Melvin Hudson Medical record number: [id number] Date of birth: 1955-04-03 Age: 58 y.o. Gender: male  Primary Care Provider: Kristine Garbe, MD Consultants: GI Code Status: FULL  Pt Overview and Major Events to Date:  2/20: Transfused 2u pRBCs  Assessment and Plan: Melvin Hudson is a 59 y.o. male presenting with GI bleed x2 days and fatigue x1 day . PMH is significant for Malignant neoplasm of tonsil, HTN, OSA, tobacco abuse  Hematochezia/ GI Bleed: Hgb 10.4>10.2 in ED. Patient with known h/o squamous cell carcinoma of the Right tonsil. Last colonoscopy several years ago. FOBT+. S/p 2 units of pRBCs.  Hgb: 8.5.  2/21 EKG with resolved tachycardia -Will consider dc of telemetry today -Vitals per floor protocol -c/s GI: will need repeat colonoscopy inpatient vs outpatient. Continue Clear liquid diet, miralax -Will FYI Oncology after colonoscopy if appropriate -Protonix BID -Tylenol PRN, Zofran PRN  -Threshold for transfusion <8.0. -Will repeat CBC at 2pm and q8 thereafter.  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 Sees Dr Isidore Moos for this. CT neck/chest scheduled for April 2016. Biopsy in 12/2013 negative for dysplasia or malignancy.  -Hold home Trental in setting of GI bleed. -Biotene, Vitamin E, MMW  HTN: hypotensive on admission. BP currently 109/68. HR 80. On aldactone, benicar at home.  -Will hold home BP medications in setting of initial soft BPs and current normotensive state -Consider adding back BP medications if BP elevates  OSA: CPAP at home -CPAP QHS   Tobacco abuse: not currently a smoker -No nicotine patches needed  Chronic pain of LE: On Percocet 10 at home -Continue Percocet 10 PRN   FEN/GI: NS @125cc /h, clears,  PPI BID Prophylaxis: SCDs in setting of GI bleed  Disposition: Discharge home pending evaluation by GI and resolution of  symptoms  Subjective:  Patient reports feeling well this am.  He is eager to have a solid diet.  We discussed possible colonoscopy while in hospital and patient voices understanding of continued need for clears.  Patient does admit to 3 total bloody stools that were worsened by Miralax yesterday.  He endorses lightheadedness and weakness this am on ambulation to restroom.  Denies N/V, SOB, CP, headache, rectal pain or itching.  Objective: Temp:  [97.5 F (36.4 C)-98.8 F (37.1 C)] 97.7 F (36.5 C) (02/22 0500) Pulse Rate:  [78-90] 80 (02/22 0500) Resp:  [16-20] 20 (02/22 0500) BP: (109-138)/(68-84) 109/68 mmHg (02/22 0500) SpO2:  [98 %-100 %] 98 % (02/22 0500) Weight:  [294 lb 6.4 oz (133.539 kg)] 294 lb 6.4 oz (133.539 kg) (02/21 2100) Physical Exam: General: Sitting up in bed having clear liquid breakfast, NAD Cardiovascular: RRR, no m/r/g Respiratory: CTAB, no increased WOB Abdomen: obese, soft, NT/ND, +BS, no palpable masses Extremities: WWP, no edema Neuro: AOx3, follows commands, no focal deficits  Laboratory:  Recent Labs Lab 04/08/14 1927 04/08/14 1935 04/09/14 1125 04/10/14 0631  WBC 6.3  --  7.6 7.4  HGB 10.4* 10.2* 10.6* 8.5*  HCT 30.0* 30.0* 30.5* 24.7*  PLT 245  --  195 196    Recent Labs Lab 04/08/14 1927 04/08/14 1935 04/09/14 1125  NA 138 141 139  K 3.7 3.7 4.0  CL 106 102 107  CO2 24  --  27  BUN 13 14 12   CREATININE 1.17 1.20 0.95  CALCIUM 8.4  --  8.8  PROT 6.0  --   --   BILITOT 0.7  --   --  ALKPHOS 111  --   --   ALT 10  --   --   AST 17  --   --   GLUCOSE 164* 161* 116*    Imaging/Diagnostic Tests: No results found.   Janora Norlander, DO 04/10/2014, 9:07 AM PGY-1, [place] Intern pager: [phone], text pages welcome

## 2014-04-11 ENCOUNTER — Encounter (HOSPITAL_COMMUNITY)
Admission: EM | Disposition: A | Discharge status: Home / Self Care | Payer: Self-pay | Source: Home / Self Care | Attending: Family Medicine

## 2014-04-11 ENCOUNTER — Encounter (HOSPITAL_COMMUNITY): Payer: Self-pay | Admitting: *Deleted

## 2014-04-11 ENCOUNTER — Inpatient Hospital Stay (HOSPITAL_COMMUNITY): Payer: Medicare Other

## 2014-04-11 DIAGNOSIS — K922 Gastrointestinal hemorrhage, unspecified: Secondary | ICD-10-CM | POA: Insufficient documentation

## 2014-04-11 DIAGNOSIS — K254 Chronic or unspecified gastric ulcer with hemorrhage: Secondary | ICD-10-CM

## 2014-04-11 DIAGNOSIS — D649 Anemia, unspecified: Secondary | ICD-10-CM

## 2014-04-11 DIAGNOSIS — Z452 Encounter for adjustment and management of vascular access device: Secondary | ICD-10-CM

## 2014-04-11 HISTORY — PX: COLONOSCOPY: SHX5424

## 2014-04-11 LAB — CBC
HCT: 24.5 % — ABNORMAL LOW (ref 39.0–52.0)
HCT: 20.7 % — ABNORMAL LOW (ref 39.0–52.0)
HCT: 21.9 % — ABNORMAL LOW (ref 39.0–52.0)
HEMOGLOBIN: 8.3 g/dL — AB (ref 13.0–17.0)
Hemoglobin: 7.1 g/dL — ABNORMAL LOW (ref 13.0–17.0)
Hemoglobin: 7.5 g/dL — ABNORMAL LOW (ref 13.0–17.0)
MCH: 28.3 pg (ref 26.0–34.0)
MCH: 28 pg (ref 26.0–34.0)
MCH: 28 pg (ref 26.0–34.0)
MCHC: 33.9 g/dL (ref 30.0–36.0)
MCHC: 34.3 g/dL (ref 30.0–36.0)
MCHC: 34.2 g/dL (ref 30.0–36.0)
MCV: 83.6 fL (ref 78.0–100.0)
MCV: 81.5 fL (ref 78.0–100.0)
MCV: 81.7 fL (ref 78.0–100.0)
PLATELETS: 158 10*3/uL (ref 150–400)
PLATELETS: 124 10*3/uL — AB (ref 150–400)
PLATELETS: 120 10*3/uL — AB (ref 150–400)
RBC: 2.93 MIL/uL — ABNORMAL LOW (ref 4.22–5.81)
RBC: 2.54 MIL/uL — ABNORMAL LOW (ref 4.22–5.81)
RBC: 2.68 MIL/uL — ABNORMAL LOW (ref 4.22–5.81)
RDW: 17.1 % — ABNORMAL HIGH (ref 11.5–15.5)
RDW: 16.9 % — AB (ref 11.5–15.5)
RDW: 16.7 % — AB (ref 11.5–15.5)
WBC: 4.9 10*3/uL (ref 4.0–10.5)
WBC: 6.3 10*3/uL (ref 4.0–10.5)
WBC: 6.1 10*3/uL (ref 4.0–10.5)

## 2014-04-11 LAB — GLUCOSE, CAPILLARY
GLUCOSE-CAPILLARY: 122 mg/dL — AB (ref 70–99)
GLUCOSE-CAPILLARY: 90 mg/dL (ref 70–99)
Glucose-Capillary: 93 mg/dL (ref 70–99)

## 2014-04-11 LAB — PREPARE RBC (CROSSMATCH)

## 2014-04-11 LAB — PROTIME-INR
INR: 1.39 (ref 0.00–1.49)
Prothrombin Time: 17.2 seconds — ABNORMAL HIGH (ref 11.6–15.2)

## 2014-04-11 LAB — MRSA PCR SCREENING: MRSA by PCR: NEGATIVE

## 2014-04-11 LAB — APTT: aPTT: 37 seconds (ref 24–37)

## 2014-04-11 SURGERY — COLONOSCOPY
Anesthesia: Moderate Sedation | Laterality: Left

## 2014-04-11 MED ORDER — SODIUM CHLORIDE 0.9 % IV SOLN: 50.0000 mg | INTRAVENOUS | Status: DC | PRN

## 2014-04-11 MED ORDER — DIPHENHYDRAMINE HCL 50 MG/ML IJ SOLN
INTRAMUSCULAR | Status: DC | PRN
  Administered 2014-04-11: 25 mg via INTRAVENOUS

## 2014-04-11 MED ORDER — CETYLPYRIDINIUM CHLORIDE 0.05 % MT LIQD: 7.0000 mL | Freq: Two times a day (BID) | OROMUCOSAL | Status: DC

## 2014-04-11 MED ORDER — MIDAZOLAM HCL 2 MG/2ML IJ SOLN
INTRAMUSCULAR | Status: AC
  Filled 2014-04-11: qty 2

## 2014-04-11 MED ORDER — FENTANYL CITRATE 0.05 MG/ML IJ SOLN
INTRAMUSCULAR | Status: AC
  Filled 2014-04-11: qty 2

## 2014-04-11 MED ORDER — SODIUM CHLORIDE 0.9 % IV SOLN
Freq: Once | INTRAVENOUS | Status: AC
  Administered 2014-04-11: 19:00:00 via INTRAVENOUS

## 2014-04-11 MED ORDER — NITROGLYCERIN 1 MG/10 ML FOR IR/CATH LAB
INTRA_ARTERIAL | Status: AC
  Administered 2014-04-11: 2 mL
  Filled 2014-04-11: qty 10

## 2014-04-11 MED ORDER — CHLORHEXIDINE GLUCONATE 0.12 % MT SOLN
15.0000 mL | Freq: Two times a day (BID) | OROMUCOSAL | Status: DC
  Administered 2014-04-11 – 2014-04-12 (×2): 15 mL via OROMUCOSAL
  Filled 2014-04-11: qty 15

## 2014-04-11 MED ORDER — DIPHENHYDRAMINE HCL 50 MG/ML IJ SOLN
INTRAMUSCULAR | Status: AC
  Filled 2014-04-11: qty 1

## 2014-04-11 MED ORDER — FENTANYL CITRATE 0.05 MG/ML IJ SOLN
INTRAMUSCULAR | Status: AC | PRN
  Administered 2014-04-11: 50 ug via INTRAVENOUS

## 2014-04-11 MED ORDER — SODIUM CHLORIDE 0.9 % IV SOLN
INTRAVENOUS | Status: DC
  Administered 2014-04-11: 09:00:00 via INTRAVENOUS

## 2014-04-11 MED ORDER — FAMOTIDINE IN NACL 20-0.9 MG/50ML-% IV SOLN: 20.0000 mg | Freq: Once | INTRAVENOUS | Status: DC

## 2014-04-11 MED ORDER — MIDAZOLAM HCL 5 MG/ML IJ SOLN
INTRAMUSCULAR | Status: AC
  Filled 2014-04-11: qty 2

## 2014-04-11 MED ORDER — IOHEXOL 300 MG/ML  SOLN
150.0000 mL | Freq: Once | INTRAMUSCULAR | Status: AC | PRN
  Administered 2014-04-11: 150 mL via INTRA_ARTERIAL

## 2014-04-11 MED ORDER — FENTANYL CITRATE 0.05 MG/ML IJ SOLN
INTRAMUSCULAR | Status: DC | PRN
  Administered 2014-04-11 (×2): 25 ug via INTRAVENOUS

## 2014-04-11 MED ORDER — SODIUM CHLORIDE 0.9 % IV SOLN: Freq: Once | INTRAVENOUS | Status: DC

## 2014-04-11 MED ORDER — LIDOCAINE HCL 1 % IJ SOLN
INTRAMUSCULAR | Status: AC
  Filled 2014-04-11: qty 20

## 2014-04-11 MED ORDER — HEPARIN SOD (PORK) LOCK FLUSH 100 UNIT/ML IV SOLN
INTRAVENOUS | Status: AC
  Filled 2014-04-11: qty 5

## 2014-04-11 MED ORDER — METHYLPREDNISOLONE SODIUM SUCC 125 MG IJ SOLR: 125.0000 mg | Freq: Three times a day (TID) | INTRAMUSCULAR | Status: DC

## 2014-04-11 MED ORDER — GLUCAGON HCL RDNA (DIAGNOSTIC) 1 MG IJ SOLR
INTRAMUSCULAR | Status: AC
  Filled 2014-04-11: qty 1

## 2014-04-11 MED ORDER — MIDAZOLAM HCL 2 MG/2ML IJ SOLN
INTRAMUSCULAR | Status: AC | PRN
  Administered 2014-04-11: 1 mg via INTRAVENOUS

## 2014-04-11 MED ORDER — HEPARIN SODIUM (PORCINE) 1000 UNIT/ML IJ SOLN
INTRAMUSCULAR | Status: AC
  Filled 2014-04-11: qty 1

## 2014-04-11 MED ORDER — MIDAZOLAM HCL 10 MG/2ML IJ SOLN
INTRAMUSCULAR | Status: DC | PRN
  Administered 2014-04-11 (×2): 2 mg via INTRAVENOUS

## 2014-04-11 MED ORDER — SODIUM CHLORIDE 0.9 % IV SOLN
Freq: Once | INTRAVENOUS | Status: AC
  Administered 2014-04-11: 11:00:00 via INTRAVENOUS

## 2014-04-11 NOTE — Consult Note (Signed)
Chief Complaint: Chief Complaint  Patient presents with  . Rectal Bleeding  Diverticular disease Active colonic bleeding  Referring Physician(s): Dr Paulita Fujita  History of Present Illness: Melvin Hudson is a 59 y.o. male   Pt in endo suite for colonoscopy Known diverticular didease Dr Paulita Fujita discovers large amount of active bleeding Cannot determine source Request for mesenteric arteriogram with possible embolization Dr Laurence Ferrari has discussed with MD Approves procedure I have seen and examined pt   Past Medical History  Diagnosis Date  . Hypertension   . Angioedema 09/17/11    tongue and lips  . Obesity   . Hx of gout   . S/P radiation therapy 07/26/2013-09/15/2013    Right tonsil/bilateral neck/ 7000 cGy  . 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 Arthritis     "both of my legs and feet" (01/05/2014)  . OSA on CPAP     Past Surgical History  Procedure Laterality Date  . Knee arthroscopy Right ~ 1994  . Multiple tooth extractions  ~ 2008  . Multiple extractions with alveoloplasty N/A 07/08/2013    Procedure: Extraction of tooth #'s 22, 27 with alveoloplasty and bilateal mandibular facial exostoses reductions;  Surgeon: Lenn Cal, DDS;  Location: [facility];  Service: Oral Surgery;  Laterality: N/A;  . Colonoscopy w/ biopsies and polypectomy      benign  . Direct laryngoscopy  01/05/2014  . Direct laryngoscopy N/A 01/05/2014    Procedure: DIRECT LARYNGOSCOPY AND BIOPSY;  Surgeon: [name], MD;  Location: [place];  Service: ENT;  Laterality: N/A;  . Left and right heart catheterization with coronary/graft angiogram N/A 12/31/2010    Procedure: LEFT AND RIGHT HEART CATHETERIZATION WITH Beatrix Fetters;  Surgeon: Laverda Page, MD;  Location: [facility];  Service: Cardiovascular;  Laterality: N/A;    Allergies: Advil and Azilsartan  Medications: Prior to Admission medications   Medication Sig Start Date End Date Taking? Authorizing  Provider  aspirin EC 81 MG tablet Take 81 mg by mouth daily. resume   Yes Historical Provider, MD  Cholecalciferol (VITAMIN D-3 PO) Take 1,000 Units by mouth daily.   Yes Historical Provider, MD  olmesartan (BENICAR) 40 MG tablet Take 40 mg by mouth daily. 09/19/13  Yes Kristine Garbe, MD  oxyCODONE-acetaminophen (PERCOCET) 10-325 MG per tablet Take 1 tablet by mouth every 6 (six) hours as needed for pain. 11/11/13  Yes Eppie Gibson, MD  pentoxifylline (TRENTAL) 400 MG CR tablet Take 1 tab daily x 1 week, then take 1 tab BID thereafter 01/22/14  Yes Eppie Gibson, MD  polyethylene glycol [facility] / Floria Raveling) packet Take 17 g by mouth daily as needed (for constipation).    Yes Historical Provider, MD  spironolactone (ALDACTONE) 25 MG tablet Take 25 mg by mouth every morning.  09/18/11  Yes Debbe Odea, MD  tadalafil (CIALIS) 20 MG tablet Take 20 mg by mouth daily as needed for erectile dysfunction.    Yes Historical Provider, MD  vitamin E (VITAMIN E) 400 UNIT capsule Take 1 cap daily x 1 week, then take 1 cap BID thereafter 01/22/14  Yes Eppie Gibson, MD  Alum & Mag Hydroxide-Simeth (MAGIC MOUTHWASH W/LIDOCAINE) SOLN 1 part 2% viscous lidocaine, 1 part water. Swish, gargle, and/or swallow 17mL QID, prn sore mouth/throat. Use 64min before meals & bedtime. Patient not taking: Reported on 04/08/2014 08/08/13   Eppie Gibson, MD  diphenhydrAMINE (BENADRYL) 25 MG tablet Take 1 tablet (25 mg total) by mouth every 6 (six)  hours. Patient not taking: Reported on 04/08/2014 10/29/13   Dorie Rank, MD     Family History  Problem Relation Age of Onset  . Cancer Sister     breast ca    History   Social History  . Marital Status: Legally Separated    Spouse Name: N/A  . Number of Children: N/A  . Years of Education: N/A   Social History Main Topics  . Smoking status: Former Smoker -- 42 years    Types: Cigarettes, Cigars    Quit date: 06/17/2013  . Smokeless tobacco: Never Used     Comment: "Quit smoking  cigarettes in ~  2007"  . 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 Drug Use: No  . Sexual Activity: Not Currently   Other Topics Concern  . None   Social History Narrative    Review of Systems: A 12 point ROS discussed and pertinent positives are indicated in the HPI above.  All other systems are negative.  Review of Systems  Constitutional: Negative for fever.  Respiratory: Negative for shortness of breath.   Psychiatric/Behavioral:       Pt has been sedated for colonoscopy    Vital Signs: BP 123/70 mmHg  Pulse 109  Temp(Src) 97.8 F (36.6 C) (Oral)  Resp 13  Ht 6\' 1"  (1.854 m)  Wt 133.539 kg (294 lb 6.4 oz)  BMI 38.85 kg/m2  SpO2 99%  Physical Exam  Cardiovascular: Normal rate and regular rhythm.   No murmur heard. Pulmonary/Chest: Effort normal and breath sounds normal. He has no wheezes.  Abdominal: Soft. Bowel sounds are normal.  Musculoskeletal: Normal range of motion.  Skin: Skin is warm and dry.  Psychiatric:  Trying to reach family for consent---pt sedated for colonoscopy  Nursing note and vitals reviewed.   Mallampati Score:  MD Evaluation Airway: WNL Heart: WNL Abdomen: WNL Chest/ Lungs: WNL ASA  Classification: 3 Mallampati/Airway Score: Two  Imaging: No results found.  Labs:  CBC:  Recent Labs  04/09/14 1125 04/10/14 0631 04/10/14 1445 04/11/14 0610  WBC 7.6 7.4 7.4 4.9  HGB 10.6* 8.5* 7.8* 8.3*  HCT 30.5* 24.7* 22.9* 24.5*  PLT 195 196 180 158    COAGS: No results for input(s): INR, APTT in the last 8760 hours.  BMP:  Recent Labs  10/29/13 1554 01/03/14 1137 04/08/14 1927 04/08/14 1935 04/09/14 1125  NA 140 141 138 141 139  K 3.9 4.5 3.7 3.7 4.0  CL 103 102 106 102 107  CO2 26 25 24   --  27  GLUCOSE 96 88 164* 161* 116*  BUN 11 10 13 14 12   CALCIUM 9.3 9.6 8.4  --  8.8  CREATININE 0.84 1.11 1.17 1.20 0.95  GFRNONAA >90 71* 66*  --  89*  GFRAA >90 82* 77*  --  >90    LIVER FUNCTION  TESTS:  Recent Labs  06/30/13 1438 04/08/14 1927  BILITOT 0.59 0.7  AST 13 17  ALT 11 10  ALKPHOS 147 111  PROT 7.3 6.0  ALBUMIN 3.9 3.5    TUMOR MARKERS: No results for input(s): AFPTM, CEA, CA199, CHROMGRNA in the last 8760 hours.  Assessment and Plan:  Large active colonic bleed discovered while in colonoscopy today Now scheduled for mesenteric arteriogram with possible embolization Calls into niece- Tameka No answer as of yet for consent   Thank you for this interesting consult.  I greatly enjoyed meeting Melvin Hudson and look forward to  participating in their care.  Signed: Taejah Ohalloran A 04/11/2014, 11:17 AM   I spent a total of 40 Minutes  in face to face in clinical consultation, greater than 50% of which was counseling/coordinating care for mesenteric arteriogram with possible embolization

## 2014-04-11 NOTE — Sedation Documentation (Signed)
Patient denies pain and is resting comfortably.  

## 2014-04-11 NOTE — Consult Note (Signed)
PULMONARY / CRITICAL CARE MEDICINE   Name: Melvin Hudson MRN: [id number] DOB: 1955-06-15    ADMISSION DATE:  04/08/2014 CONSULTATION DATE:  2/23  REFERRING MD :  Erin Hearing   CHIEF COMPLAINT:  hematochezia  INITIAL PRESENTATION:  59 year old male with history of squamous cell CA right tonsil (s/p XRT), hypertension, OSA (uses CPAP at night) presented to ED 2/20 with rectal bleeding x 2 days and fatigue/weakness x 1 day. Received 4 units PRBCs from 2/20 - 2/22. Colonoscopy 2/23 with large active colonic bleed. Transferred to ICU post-colonoscopy for active volume resuscitation.    STUDIES:  Colonoscopy 2/23 - large active colonic bleed Mesenteric angiogram 2/23  SIGNIFICANT EVENTS: Transferred to ICU 2/23    HISTORY OF PRESENT ILLNESS:   See above   REVIEW OF SYSTEMS:  Denies abdominal pain. No tenderness to palpation.   SUBJECTIVE:  Diaphoretic   VITAL SIGNS: Temp:  [97.3 F (36.3 C)-98.5 F (36.9 C)] 97.3 F (36.3 C) (02/23 1200) Pulse Rate:  [69-143] 92 (02/23 1200) Resp:  [9-26] 18 (02/23 1200) BP: (94-159)/(39-94) 100/72 mmHg (02/23 1200) SpO2:  [98 %-100 %] 100 % (02/23 1200) HEMODYNAMICS:   VENTILATOR SETTINGS: 2 liters    INTAKE / OUTPUT:  Intake/Output Summary (Last 24 hours) at 04/11/14 1235 Last data filed at 04/11/14 1145  Gross per 24 hour  Intake   2185 ml  Output      0 ml  Net   2185 ml    PHYSICAL EXAMINATION: General:  Alert.  Neuro:  Follows commands, appropriate  HEENT:  Mucous membranes moist Cardiovascular:  S1S2, no MGR. Tachycardia on bedside monitor. Distal pulses intact.  Lungs:  Clear bilaterally  Abdomen:  Active bowel sounds. Abdomen soft. Active bleeding from rectum.  Musculoskeletal: intact Skin:  Grossly intact   LABS:  CBC  Recent Labs Lab 04/10/14 0631 04/10/14 1445 04/11/14 0610  WBC 7.4 7.4 4.9  HGB 8.5* 7.8* 8.3*  HCT 24.7* 22.9* 24.5*  PLT 196 180 158   Coag's No results for input(s): APTT, INR in the  last 168 hours. BMET  Recent Labs Lab 04/08/14 1927 04/08/14 1935 04/09/14 1125  NA 138 141 139  K 3.7 3.7 4.0  CL 106 102 107  CO2 24  --  27  BUN 13 14 12   CREATININE 1.17 1.20 0.95  GLUCOSE 164* 161* 116*   Electrolytes  Recent Labs Lab 04/08/14 1927 04/09/14 1125  CALCIUM 8.4 8.8   Sepsis Markers No results for input(s): LATICACIDVEN, PROCALCITON, O2SATVEN in the last 168 hours. ABG No results for input(s): PHART, PCO2ART, PO2ART in the last 168 hours. Liver Enzymes  Recent Labs Lab 04/08/14 1927  AST 17  ALT 10  ALKPHOS 111  BILITOT 0.7  ALBUMIN 3.5   Cardiac Enzymes No results for input(s): TROPONINI, PROBNP in the last 168 hours. Glucose  Recent Labs Lab 04/11/14 1122  GLUCAP 122*    Imaging No results found.   ASSESSMENT / PLAN:  PULMONARY A: OSA P:   CPAP at night Wean 02 as tolerated  CARDIOVASCULAR CVL - Right IJ cordis & TLC 2/23  A:  Hypovolemic hemorrhagic shock  P:  Volume resuscitation - see hematologic section Serial CBCs hold antihypertensives given acute blood loss & resuscitation  RENAL A: No active issues but at risk for AKI given hypotension  P:   F/u chem  Strict I/Os  Assure euvolemia   GASTROINTESTINAL A:   Acute colonic bleed P:   For mesenteric angiogram per GI/IR  Continue pantoprazole daily (no need for gtt) See hematologic section   HEMATOLOGIC A:  Anemia from acute blood loss Hemorrhagic shock  High risk  P:  Transfuse per usual ICU guidelines  Trend CBCs Repeat coags  INFECTIOUS No active issues    ENDOCRINE A:  No active issues  P:   ssi   NEUROLOGIC A: No active issues P: Uses oxycodone at home for mouth pain. Reassess need for further pain management.   FAMILY  - Updates:   - Inter-disciplinary family meet or Palliative Care meeting due by:  2/30   TODAY'S SUMMARY: 59 year old male with history of tonsillar cancer and hypertension admitted 2/20 for rectal bleeding.  Transferred to ICU after colonoscopy 2/23 with large active colonic bleeding for volume resuscitation and hemorrhagic shock. Transfused with 2 units PRBCs & 2 units FFP and sent to IR for mesenteric angiogram.    Pulmonary and [place] Pager: [phone]  04/11/2014, 12:35 PM  STAFF NOTE: I, Merrie Roof, MD FACP have personally reviewed patient's available data, including medical history, events of note, physical examination and test results as part of my evaluation. I have discussed with resident/NP and other care providers such as pharmacist, RN and RRT. In addition, I personally evaluated patient and elicited key findings of: no distress, impressive BRBPR, for rapid transfusion, need to place cordis stat prior to IR trip, ppi intermittent, NPO, may need to notify CCS, abdom is soft The patient is critically ill with multiple organ systems failure and requires high complexity decision making for assessment and support, frequent evaluation and titration of therapies, application of advanced monitoring technologies and extensive interpretation of multiple databases.   Critical Care Time devoted to patient care services described in this note is35 Minutes. This time reflects time of care of this signee: Merrie Roof, MD FACP. This critical care time does not reflect procedure time, or teaching time or supervisory time of PA/NP/Med student/Med Resident etc but could involve care discussion time. Rest per NP/medical resident whose note is outlined above and that I agree with   Lavon Paganini. Titus Mould, MD, [place] Pgr: [place] [facility] 04/11/2014 1:36 PM

## 2014-04-11 NOTE — Interval H&P Note (Signed)
History and Physical Interval Note:  04/11/2014 10:10 AM  Melvin Hudson  has presented today for surgery, with the diagnosis of hematochezia  The various methods of treatment have been discussed with the patient and family. After consideration of risks, benefits and other options for treatment, the patient has consented to  Procedure(s): COLONOSCOPY (Left) as a surgical intervention .  The patient's history has been reviewed, patient examined, no change in status, stable for surgery.  I have reviewed the patient's chart and labs.  Questions were answered to the patient's satisfaction.     Kory Rains M  Assessment:  1.  Hematochezia. 2.  Acute blood loss anemia.  Plan:  1.  Colonoscopy. 2.  Risks (bleeding, infection, bowel perforation that could require surgery, sedation-related changes in cardiopulmonary systems), benefits (identification and possible treatment of source of symptoms, exclusion of certain causes of symptoms), and alternatives (watchful waiting, radiographic imaging studies, empiric medical treatment) of colonoscopy were explained to patient/family in detail and patient wishes to proceed.

## 2014-04-11 NOTE — Progress Notes (Signed)
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 Received 4 units PRBCs from 2/20 - 2/22. Colonoscopy 2/23 with large active colonic bleed. Transferred to ICU post-colonoscopy for active volume resuscitation.    STUDIES:  Colonoscopy 2/23 - large active colonic bleed Mesenteric angiogram 2/23 >> No evidence of bleeding despite injection of vasodilator into the IMA arterial bed NM GI scan 2/24 >> No scintigraphic localization of GI bleed  SIGNIFICANT EVENTS: 2/23 >> Transferred to ICU w/ massive GI bleed s/p 7u pRBCs + 2u FFP  SUBJECTIVE: Report mild pain RUQ ab pain and being hungry. Minimal rectal bleeding. Mild pain in leg, but not requesting home pain meds.   VITAL SIGNS: Temp:  [97.3 F (36.3 C)-98.8 F (37.1 C)] 98.6 F (37 C) (02/24 0015) Pulse Rate:  [73-143] 112 (02/24 0500) Resp:  [9-29] 26 (02/24 0500) BP: (94-159)/(39-94) 148/71 mmHg (02/24 0500) SpO2:  [96 %-100 %] 100 % (02/24 0500) HEMODYNAMICS:   VENTILATOR SETTINGS:   INTAKE / OUTPUT:  Intake/Output Summary (Last 24 hours) at 04/12/14 0800 Last data filed at 04/12/14 3662  Gross per 24 hour  Intake   3750 ml  Output   2550 ml  Net   1200 ml    PHYSICAL EXAMINATION: General:  Alert.  Neuro:  Follows commands, appropriate  HEENT:  Mucous membranes moist Cardiovascular:  S1S2, no MGR. RRR. Distal pulses intact.  Lungs:  Clear bilaterally  Abdomen:  Active bowel sounds. Abdomen soft. Minimal bleeding from rectum.  Musculoskeletal: intact Skin:  Grossly intact   LABS:  CBC  Recent Labs Lab 04/11/14 1615 04/11/14 2014  04/12/14 0110  WBC 6.3 6.1 5.8  HGB 7.1* 7.5* 7.4*  HCT 20.7* 21.9* 21.4*  PLT 124* 120* 116*   Coag's  Recent Labs Lab 04/11/14 1145  APTT 37  INR 1.39   BMET  Recent Labs Lab 04/08/14 1927 04/08/14 1935 04/09/14 1125  NA 138 141 139  K 3.7 3.7 4.0  CL 106 102 107  CO2 24  --  27  BUN 13 14 12   CREATININE 1.17 1.20 0.95  GLUCOSE 164* 161* 116*   Electrolytes  Recent Labs Lab 04/08/14 1927 04/09/14 1125  CALCIUM 8.4 8.8   Sepsis Markers No results for input(s): LATICACIDVEN, PROCALCITON, O2SATVEN in the last 168 hours. ABG No results for input(s): PHART, PCO2ART, PO2ART in the last 168 hours. Liver Enzymes  Recent Labs Lab 04/08/14 1927  AST 17  ALT 10  ALKPHOS 111  BILITOT 0.7  ALBUMIN 3.5   Cardiac Enzymes No results for input(s): TROPONINI, PROBNP in the last 168 hours. Glucose  Recent Labs Lab 04/11/14 1122 04/11/14 1537 04/11/14 1918 04/12/14 0016 04/12/14 0401  GLUCAP 122* 93 90 93 93    Imaging Ir Angiogram Visceral Selective  04/11/2014   CLINICAL DATA:  59 year old male with acute lower GI bleed. Fresh clot and active bleeding was visualized by Dr. Paulita Fujita In the descending colon/sigmoid colon junction on colonoscopy. Patient presents to interventional Radiology for urgent visceral angiography and possible embolization.  EXAM: SELECTIVE VISCERAL ARTERIOGRAPHY; IR ULTRASOUND GUIDANCE VASC ACCESS RIGHT  Date: 04/11/2014  PROCEDURE: 1. Ultrasound guided puncture of the right common femoral vein 2. Catheterization of the inferior mesenteric artery with arteriogram 3. Catheterization of the celicomesenteric trunk with arteriogram 4. Catheterization of the superior mesenteric artery with arteriogram 5. Catheterization of the inferior mesenteric artery with injection of 200 mcg nitroglycerin followed by repeat arteriogram 6. Limited right common femoral arteriogram 7. Application of a Cordis Exoseal closure device Interventional Radiologist:  Criselda Peaches, MD  ANESTHESIA/SEDATION: Moderate (conscious) sedation was used. 1 mg Versed, 50 mcg Fentanyl were administered intravenously. The patient's vital signs were monitored continuously by radiology nursing throughout the procedure.  Sedation Time: 35 minutes  MEDICATIONS: 200 mcg nitroglycerin administered intra-arterially  FLUOROSCOPY TIME:  7 minutes 18 seconds  1221.7 mGy  CONTRAST:  110mL OMNIPAQUE IOHEXOL 300 MG/ML  SOLN  TECHNIQUE: Informed consent was obtained from the patient following explanation of the procedure, risks, benefits and alternatives. The patient understands, agrees and consents for the procedure. All questions were addressed. A time out was performed.  Maximal barrier sterile technique utilized including caps, mask, sterile gowns, sterile gloves, large sterile drape, hand hygiene, and Betadine skin prep.  The right groin was interrogated with ultrasound. The common femoral artery was identified. There is no significant atherosclerotic plaque. The vessels widely patent. An image was obtained and stored for the medical record. Local anesthesia was attained by infiltration with 1% lidocaine. A small dermatotomy was made. Under real-time sonographic guidance, the vessel was punctured with a 21 gauge micropuncture needle. Using the 4 [place] transitional micro sheath, the initial micro wire was exchanged for a 0.035 Bentson wire. A RIM catheter was then advanced into the abdominal aorta and used to select the inferior mesenteric artery.  The inferior mesenteric artery was selected first given the clinical history of a descending/sigmoid colonic diverticular bleed. Angiography of the entire arterial distribution was performed. There was excellent visualization from the rectum to the splenic flexure. No evidence of active bleeding or vascular abnormality.  The rim catheter was then exchanged over a wire for a a C2 Cobra catheter. A C2 Cobra catheter was next used to select the SMA. However,  the initial arteriogram demonstrates a that the vessel is in fact a celicomesenteric trunk. Hypertrophied collaterals are noted throughout the pancreaticoduodenal arcade and gastroduodenal artery.  Using a glidewire, the C2 catheter was advanced into the superior mesenteric artery. A superior mesenteric arteriogram was then performed. There was adequate visualization of the right and transverse colon to the level of the splenic flexure as well as throughout the small bowel. No evidence of active bleeding or arterial abnormality.  The C2 catheter was then exchanged for the RIM catheter which was used to re- select the inferior mesenteric artery. 200 mcg of nitroglycerin was then administered intra-arterially into the inferior mesenteric arterial bed. After a 1 minutes delay, repeat arteriography was performed. Again, no evidence of active bleeding although there is increased capillary blush throughout the entire left colon. No focal vascular abnormality.  A limited right common femoral arteriogram was next performed confirming the arterial access. Hemostasis was attained with the assistance of a Cordis ExoSeal extra arterial plug.  COMPLICATIONS: None  IMPRESSION: 1. No evidence of active bleeding or acute vascular abnormality despite provocation with Vasa dilator (200 mcg nitroglycerin into the IMA arterial bed). 2. Variant visceral artery anatomy. The celiac artery and superior mesenteric artery share a common origin (celicomesenteric trunk).  PLAN: 1. If there is recurrent bleeding in the next 24 hours, recommend tagged red  blood cell study to more fully localize the source of bleeding. 2. If recurrent bleeding occurs after 24 hours, recommend CTA of the abdomen and pelvis to localize the source of bleeding. 3. If bleeding is brisk and localizes by either tagged red blood cell study or CTA, repeat arteriography could then be performed. Signed,  Criselda Peaches, MD  Vascular and Interventional Radiology  Specialists  [facility] Radiology   Electronically Signed   By: Jacqulynn Cadet M.D.   On: 04/11/2014 17:46   Ir Angiogram Visceral Selective  04/11/2014   CLINICAL DATA:  59 year old male with acute lower GI bleed. Fresh clot and active bleeding was visualized by Dr. Paulita Fujita In the descending colon/sigmoid colon junction on colonoscopy. Patient presents to interventional Radiology for urgent visceral angiography and possible embolization.  EXAM: SELECTIVE VISCERAL ARTERIOGRAPHY; IR ULTRASOUND GUIDANCE VASC ACCESS RIGHT  Date: 04/11/2014  PROCEDURE: 1. Ultrasound guided puncture of the right common femoral vein 2. Catheterization of the inferior mesenteric artery with arteriogram 3. Catheterization of the celicomesenteric trunk with arteriogram 4. Catheterization of the superior mesenteric artery with arteriogram 5. Catheterization of the inferior mesenteric artery with injection of 200 mcg nitroglycerin followed by repeat arteriogram 6. Limited right common femoral arteriogram 7. Application of a Cordis Exoseal closure device Interventional Radiologist:  Criselda Peaches, MD  ANESTHESIA/SEDATION: Moderate (conscious) sedation was used. 1 mg Versed, 50 mcg Fentanyl were administered intravenously. The patient's vital signs were monitored continuously by radiology nursing throughout the procedure.  Sedation Time: 35 minutes  MEDICATIONS: 200 mcg nitroglycerin administered intra-arterially  FLUOROSCOPY TIME:  7 minutes 18 seconds  1221.7 mGy  CONTRAST:  183mL OMNIPAQUE IOHEXOL 300 MG/ML  SOLN  TECHNIQUE: Informed consent was obtained from the patient following explanation of the procedure, risks, benefits and alternatives. The patient understands, agrees and consents for the procedure. All questions were addressed. A time out was performed.  Maximal barrier sterile technique utilized including caps, mask, sterile gowns, sterile gloves, large sterile drape, hand hygiene, and Betadine skin prep.  The right groin  was interrogated with ultrasound. The common femoral artery was identified. There is no significant atherosclerotic plaque. The vessels widely patent. An image was obtained and stored for the medical record. Local anesthesia was attained by infiltration with 1% lidocaine. A small dermatotomy was made. Under real-time sonographic guidance, the vessel was punctured with a 21 gauge micropuncture needle. Using the 4 [place] transitional micro sheath, the initial micro wire was exchanged for a 0.035 Bentson wire. A RIM catheter was then advanced into the abdominal aorta and used to select the inferior mesenteric artery.  The inferior mesenteric artery was selected first given the clinical history of a descending/sigmoid colonic diverticular bleed. Angiography of the entire arterial distribution was performed. There was excellent visualization from the rectum to the splenic flexure. No evidence of active bleeding or vascular abnormality.  The rim catheter was then exchanged over a wire for a a C2 Cobra catheter. A C2 Cobra catheter was next used to select the SMA. However, the initial arteriogram demonstrates a that the vessel is in fact a celicomesenteric trunk. Hypertrophied collaterals are noted throughout the pancreaticoduodenal arcade and gastroduodenal artery.  Using a glidewire, the C2 catheter was advanced into the superior mesenteric artery. A superior mesenteric arteriogram was then performed. There was adequate visualization of the right and transverse colon to the level of the splenic flexure as well as throughout the small bowel. No evidence of active bleeding or arterial abnormality.  The C2 catheter  was then exchanged for the RIM catheter which was used to re- select the inferior mesenteric artery. 200 mcg of nitroglycerin was then administered intra-arterially into the inferior mesenteric arterial bed. After a 1 minutes delay, repeat arteriography was performed. Again, no evidence of active bleeding  although there is increased capillary blush throughout the entire left colon. No focal vascular abnormality.  A limited right common femoral arteriogram was next performed confirming the arterial access. Hemostasis was attained with the assistance of a Cordis ExoSeal extra arterial plug.  COMPLICATIONS: None  IMPRESSION: 1. No evidence of active bleeding or acute vascular abnormality despite provocation with Vasa dilator (200 mcg nitroglycerin into the IMA arterial bed). 2. Variant visceral artery anatomy. The celiac artery and superior mesenteric artery share a common origin (celicomesenteric trunk).  PLAN: 1. If there is recurrent bleeding in the next 24 hours, recommend tagged red blood cell study to more fully localize the source of bleeding. 2. If recurrent bleeding occurs after 24 hours, recommend CTA of the abdomen and pelvis to localize the source of bleeding. 3. If bleeding is brisk and localizes by either tagged red blood cell study or CTA, repeat arteriography could then be performed. Signed,  Criselda Peaches, MD  Vascular and Interventional Radiology Specialists  [facility] Radiology   Electronically Signed   By: Jacqulynn Cadet M.D.   On: 04/11/2014 17:46   Ir US Guide Vasc Access Right  04/11/2014   CLINICAL DATA:  59 year old male with acute lower GI bleed. Fresh clot and active bleeding was visualized by Dr. Paulita Fujita In the descending colon/sigmoid colon junction on colonoscopy. Patient presents to interventional Radiology for urgent visceral angiography and possible embolization.  EXAM: SELECTIVE VISCERAL ARTERIOGRAPHY; IR ULTRASOUND GUIDANCE VASC ACCESS RIGHT  Date: 04/11/2014  PROCEDURE: 1. Ultrasound guided puncture of the right common femoral vein 2. Catheterization of the inferior mesenteric artery with arteriogram 3. Catheterization of the celicomesenteric trunk with arteriogram 4. Catheterization of the superior mesenteric artery with arteriogram 5. Catheterization of the inferior  mesenteric artery with injection of 200 mcg nitroglycerin followed by repeat arteriogram 6. Limited right common femoral arteriogram 7. Application of a Cordis Exoseal closure device Interventional Radiologist:  Criselda Peaches, MD  ANESTHESIA/SEDATION: Moderate (conscious) sedation was used. 1 mg Versed, 50 mcg Fentanyl were administered intravenously. The patient's vital signs were monitored continuously by radiology nursing throughout the procedure.  Sedation Time: 35 minutes  MEDICATIONS: 200 mcg nitroglycerin administered intra-arterially  FLUOROSCOPY TIME:  7 minutes 18 seconds  1221.7 mGy  CONTRAST:  122mL OMNIPAQUE IOHEXOL 300 MG/ML  SOLN  TECHNIQUE: Informed consent was obtained from the patient following explanation of the procedure, risks, benefits and alternatives. The patient understands, agrees and consents for the procedure. All questions were addressed. A time out was performed.  Maximal barrier sterile technique utilized including caps, mask, sterile gowns, sterile gloves, large sterile drape, hand hygiene, and Betadine skin prep.  The right groin was interrogated with ultrasound. The common femoral artery was identified. There is no significant atherosclerotic plaque. The vessels widely patent. An image was obtained and stored for the medical record. Local anesthesia was attained by infiltration with 1% lidocaine. A small dermatotomy was made. Under real-time sonographic guidance, the vessel was punctured with a 21 gauge micropuncture needle. Using the 4 [place] transitional micro sheath, the initial micro wire was exchanged for a 0.035 Bentson wire. A RIM catheter was then advanced into the abdominal aorta and used to select the inferior mesenteric artery.  The inferior  mesenteric artery was selected first given the clinical history of a descending/sigmoid colonic diverticular bleed. Angiography of the entire arterial distribution was performed. There was excellent visualization from the rectum  to the splenic flexure. No evidence of active bleeding or vascular abnormality.  The rim catheter was then exchanged over a wire for a a C2 Cobra catheter. A C2 Cobra catheter was next used to select the SMA. However, the initial arteriogram demonstrates a that the vessel is in fact a celicomesenteric trunk. Hypertrophied collaterals are noted throughout the pancreaticoduodenal arcade and gastroduodenal artery.  Using a glidewire, the C2 catheter was advanced into the superior mesenteric artery. A superior mesenteric arteriogram was then performed. There was adequate visualization of the right and transverse colon to the level of the splenic flexure as well as throughout the small bowel. No evidence of active bleeding or arterial abnormality.  The C2 catheter was then exchanged for the RIM catheter which was used to re- select the inferior mesenteric artery. 200 mcg of nitroglycerin was then administered intra-arterially into the inferior mesenteric arterial bed. After a 1 minutes delay, repeat arteriography was performed. Again, no evidence of active bleeding although there is increased capillary blush throughout the entire left colon. No focal vascular abnormality.  A limited right common femoral arteriogram was next performed confirming the arterial access. Hemostasis was attained with the assistance of a Cordis ExoSeal extra arterial plug.  COMPLICATIONS: None  IMPRESSION: 1. No evidence of active bleeding or acute vascular abnormality despite provocation with Vasa dilator (200 mcg nitroglycerin into the IMA arterial bed). 2. Variant visceral artery anatomy. The celiac artery and superior mesenteric artery share a common origin (celicomesenteric trunk).  PLAN: 1. If there is recurrent bleeding in the next 24 hours, recommend tagged red blood cell study to more fully localize the source of bleeding. 2. If recurrent bleeding occurs after 24 hours, recommend CTA of the abdomen and pelvis to localize the source of  bleeding. 3. If bleeding is brisk and localizes by either tagged red blood cell study or CTA, repeat arteriography could then be performed. Signed,  Criselda Peaches, MD  Vascular and Interventional Radiology Specialists  [facility] Radiology   Electronically Signed   By: Jacqulynn Cadet M.D.   On: 04/11/2014 17:46   Dg Chest Port 1 View  04/11/2014   CLINICAL DATA:  Status post central line placement  EXAM: PORTABLE CHEST - 1 VIEW  COMPARISON:  None.  FINDINGS: Cardiac shadow is at the upper limits of normal in size. A right central venous line is noted with the catheter tip at the cavoatrial junction. No pneumothorax is seen. The lungs are clear.  IMPRESSION: Central line at the cavoatrial junction without pneumothorax. No acute abnormality is noted.   Electronically Signed   By: Inez Catalina M.D.   On: 04/11/2014 15:53   ASSESSMENT / PLAN:  PULMONARY A: At risk trali OSA P:   CPAP at night Wean 02 as tolerated pcxr in am  CARDIOVASCULAR CVL - Right IJ cordis & TLC 2/23  A:  Hypovolemic hemorrhagic shock  P:  Volume resuscitation - see hematologic section Keep large bore access Serial CBCs hold antihypertensives given acute blood loss & resuscitation Keep tele, follow pulse press Avoid significant crystalloid resus   RENAL A:HypoK P:   F/u chem  Strict I/Os  Assure euvolemia  Use prbc when needed K supp  GASTROINTESTINAL A:   Acute colonic bleed, unknwon source P:   Continue pantoprazole daily (no need for  gtt) See hematologic section  Advanced to clear liquids Cbc seriel Scans reviewed   HEMATOLOGIC A:  Anemia from acute blood loss Hemorrhagic shock - stabilized  High risk  P:  Transfuse per usual ICU guidelines; s/p 7u pRBC + 2u FFP (2/23) Trend CBCs q 6hrs; Hgb last 7.4  Repeat coags wnl  INFECTIOUS No active issues   ENDOCRINE A:  No active issues  P:   ssi   NEUROLOGIC A: No active issues P: Uses oxycodone at home for mouth pain. Reassess  need for further pain management.   FAMILY  - Updates:   - Inter-disciplinary family meet or Palliative Care meeting due by:  2/30  Olam Idler, MD 04/12/2014, 8:01 AM PGY-2, [facility]  STAFF NOTE: I, Merrie Roof, MD FACP have personally reviewed patient's available data, including medical history, events of note, physical examination and test results as part of my evaluation. I have discussed with resident/NP and other care providers such as pharmacist, RN and RRT. In addition, I personally evaluated patient and elicited key findings of: has done remarkably well with hemodynamics with volume loss, cbc to follow after blood given, coags wnl, to sdu, diet per GI, tag scan neg, await further recs fmo GI fi rebleed To triad, sdu    Lavon Paganini. Titus Mould, MD, [place] Pgr: [facility] 04/12/2014 11:18 AM

## 2014-04-11 NOTE — Progress Notes (Signed)
Pt Hb currently 7.5 from 7.1 after 1U PRBCs.  Pt has had no bloody stools since 1128 today.  Vital signs are stable. MD aware.

## 2014-04-11 NOTE — Progress Notes (Addendum)
Family Medicine Teaching Service Daily Progress Note Intern Pager: [phone]  Patient name: Melvin Hudson Medical record number: [id number] Date of birth: 08-Nov-1955 Age: 59 y.o. Gender: male  Primary Care Provider: Kristine Garbe, MD Consultants: GI Code Status: FULL  Pt Overview and Major Events to Date:  2/20: Transfused 2u pRBCs 2/23: Transfused 2 more units pRBCs  Assessment and Plan: Melvin Hudson is a 59 y.o. male presenting with GI bleed x2 days and fatigue x1 day . PMH is significant for Malignant neoplasm of tonsil, HTN, OSA, tobacco abuse  Hematochezia/ GI Bleed: Hgb 10.4>10.2 in ED. Patient with known h/o squamous cell carcinoma of the Right tonsil. Last colonoscopy several years ago. FOBT+. S/p 2 units of pRBCs.  Hgb: 7.8>8.3. S/p 2 units pRBCs Not responding appropriately. -Telemetry -Vitals per floor protocol -c/s GI: will need repeat colonoscopy This am. -Will FYI Oncology after colonoscopy if appropriate -Protonix BID -Tylenol PRN, Zofran PRN  -Threshold for transfusion <8.0. -Repeat CBC after procedure, will sched for 3pm.  Likely will need add'l transfusion  Squamous cell carcinoma of the Right tonsil: s/p radiation. Sees Dr Isidore Moos for this. CT neck/chest scheduled for April 2016. Biopsy in 12/2013 negative for dysplasia or malignancy.  -Hold home Trental in setting of GI bleed. -Biotene, Vitamin E, MMW  HTN: hypotensive on admission. BP currently 109/68. HR 80. On aldactone, benicar at home.  -Will hold home BP medications in setting of initial soft BPs and current normotensive state -Consider adding back BP medications if BP elevates  OSA: CPAP at home -CPAP QHS   Tobacco abuse: not currently a smoker -No nicotine patches needed  Chronic pain of LE: On Percocet 10 at home -Continue Percocet 10 PRN   FEN/GI: NS @125cc /h, clears,  PPI BID Prophylaxis: SCDs in setting of GI bleed  Disposition: Discharge home pending evaluation by GI and  resolution of symptoms  Subjective:  Caught patient in hall on way to colonoscopy.  Patient reports that he was doing well with cleared stools until "just a min ago" when he had frank bloody stools again.  Denies N/V/abdominal pain    Will attempt revisit this afternoon for a more thorough exam and to discuss colonoscopy findings if available.  Objective: Temp:  [97.5 F (36.4 C)-98.7 F (37.1 C)] 97.8 F (36.6 C) (02/23 0857) Pulse Rate:  [69-105] 105 (02/23 0857) Resp:  [17-22] 22 (02/23 0857) BP: (114-159)/(69-94) 159/94 mmHg (02/23 0857) SpO2:  [98 %-100 %] 99 % (02/23 0857) Physical Exam: General: Sitting in wheelchair in hall, NAD Cardiovascular: not examined Respiratory: no increased WOB Abdomen: obese Extremities: no visible edema Neuro: AOx3, speech normal, no focal deficits  Laboratory:  Recent Labs Lab 04/10/14 0631 04/10/14 1445 04/11/14 0610  WBC 7.4 7.4 4.9  HGB 8.5* 7.8* 8.3*  HCT 24.7* 22.9* 24.5*  PLT 196 180 158    Recent Labs Lab 04/08/14 1927 04/08/14 1935 04/09/14 1125  NA 138 141 139  K 3.7 3.7 4.0  CL 106 102 107  CO2 24  --  27  BUN 13 14 12   CREATININE 1.17 1.20 0.95  CALCIUM 8.4  --  8.8  PROT 6.0  --   --   BILITOT 0.7  --   --   ALKPHOS 111  --   --   ALT 10  --   --   AST 17  --   --   GLUCOSE 164* 161* 116*    Imaging/Diagnostic Tests: No results found.   Janora Norlander, DO  04/11/2014, 9:42 AM PGY-1, [place] Intern pager: [phone], text pages welcome

## 2014-04-11 NOTE — Procedures (Signed)
Interventional Radiology Procedure Note  Procedure: Diagnostic visceral angiogram.  No evidence of bleeding despite injection of vasodilator into the IMA arterial bed.  Access: Right common femoral artery, 4F Closure: ExoSeal Complications: None Recommendations: - Bedrest x 4 hrs - Continue serial H&H and transfuse as needed - If recurrent bleeding, recommend tagged RBC study.  Alternately, if > 24 hrs has passed before re-bleed then would consider CTA ABD/PELVIS  Signed,  Criselda Peaches, MD

## 2014-04-11 NOTE — Op Note (Signed)
[facility] [place], [id number]   COLONOSCOPY PROCEDURE REPORT  PATIENT: Melvin Hudson, Melvin Hudson  MR#: [id number] BIRTHDATE: 08-08-1955 , 59  yrs. old GENDER: male ENDOSCOPIST: Arta Silence, MD REFERRED KP:[id number] Samella Parr, M.D. PROCEDURE DATE:  26-Apr-2014 PROCEDURE:   Colonoscopy, diagnostic ASA CLASS:   Class III INDICATIONS:hematochezia, anemia. MEDICATIONS: Benadryl 25 mg IV, Fentanyl 50 mcg IV, and Versed 4 mg IV  DESCRIPTION OF PROCEDURE:   After the risks benefits and alternatives of the procedure were thoroughly explained, informed consent was obtained.  revealed no abnormalities of the rectum. The pediatric  colonoscope was introduced through the anus and advanced to the descending colon. No adverse events experienced. The quality of the prep was  poor due to blood       The instrument was then slowly withdrawn as the colon was fully examined.    Findings:  Digital rectal exam was normal.  Rectum endoscopically normal.  Scattered sigmoid diverticula visible.  In the mid-sigmoid, and proximally to the level of the distal descending colon, there was a large volume of old clotted blood and fresh red blood.  About 500 cc of red blood was suctioned through the colonoscope.  Visibility was poor due to ongoing bleeding, unable to pass scope proximal to distal descending colon. Bleeding site was unable to be localized.  Procedure stopped due to ongoing bleeding and poor visibility.           Withdrawal time was   . The scope was withdrawn and the procedure completed.  COMPLICATIONS:  ENDOSCOPIC IMPRESSION:     Active lower GI tract bleeding.  Suspect diverticular bleeding in region of sigmoid and descending colon.  RECOMMENDATIONS:     1.  Watch for potential complications of procedure. 2.  Volume repletion. 3.  Place another IV for enhanced IV access. 4.  Transfer to ICU. 5.  Transfuse another one unit blood, with two more on  hold. 6.  IR consult for consideration of angiogram; I have called. 7.  I have discussed case with primary team.  eSigned:  Arta Silence, MD 26-Apr-2014 10:48 AM   cc:  CPT CODES: ICD CODES:  The ICD and CPT codes recommended by this software are interpretations from the data that the clinical staff has captured with the software.  The verification of the translation of this report to the ICD and CPT codes and modifiers is the sole responsibility of the health care institution and practicing physician where this report was generated.  [facility]. will not be held responsible for the validity of the ICD and CPT codes included on this report.  AMA assumes no liability for data contained or not contained herein. CPT is a Designer, television/film set of the [employer].

## 2014-04-11 NOTE — Progress Notes (Signed)
Pt with large grossly bloody BM, bedpan half full x's 2 with frank blood, some clots noted.

## 2014-04-11 NOTE — Procedures (Signed)
Central Venous Catheter Insertion Procedure Note Melvin Hudson [id number] 24-Aug-1955  Procedure: Insertion of Central Venous Catheter Indications: Assessment of intravascular volume, Drug and/or fluid administration and Frequent blood sampling  Procedure Details Consent: Risks of procedure as well as the alternatives and risks of each were explained to the (patient/caregiver).  Consent for procedure obtained. Time Out: Verified patient identification, verified procedure, site/side was marked, verified correct patient position, special equipment/implants available, medications/allergies/relevent history reviewed, required imaging and test results available.  Performed Real time [place] was used to ID and cannulate the vessel  Maximum sterile technique was used including antiseptics, cap, gloves, gown, hand hygiene, mask and sheet. Skin prep: Chlorhexidine; local anesthetic administered A antimicrobial bonded/coated quadruple lumen catheter was placed in the right internal jugular vein using the Seldinger technique.  Evaluation Blood flow good Complications: No apparent complications Patient did tolerate procedure well. Chest X-ray ordered to verify placement.  CXR: pending.  Melvin Hudson 04/11/2014, 12:30 PM  [place] Consented and emergent Melvin Hudson. Melvin Mould, MD, [place] Pgr: [name]

## 2014-04-11 NOTE — Progress Notes (Signed)
Placed pt. On cpap. Pt. Tolerating well at this time. 

## 2014-04-11 NOTE — H&P (View-Only) (Signed)
Subjective: Had more blood in stool. Mild left-sided abdominal pain.  Objective: Vital signs in last 24 hours: Temp:  [97.5 F (36.4 C)-98.8 F (37.1 C)] 98.7 F (37.1 C) (02/22 0952) Pulse Rate:  [78-95] 95 (02/22 0952) Resp:  [16-20] 18 (02/22 0952) BP: (109-138)/(68-91) 116/91 mmHg (02/22 0952) SpO2:  [98 %-100 %] 100 % (02/22 0952) Weight:  [133.539 kg (294 lb 6.4 oz)] 133.539 kg (294 lb 6.4 oz) (02/21 2100) Weight change: -2.586 kg (-5 lb 11.2 oz) Last BM Date: 04/08/14  PE: GEN:  Overweight, CPAP device at bedside ABD:  Soft, protuberant, mild left-sided tenderness without peritonitis, active bowel sounds  Lab Results: CBC    Component Value Date/Time   WBC 7.4 04/10/2014 0631   WBC 10.2 06/30/2013 1437   RBC 3.03* 04/10/2014 0631   RBC 4.26 06/30/2013 1437   HGB 8.5* 04/10/2014 0631   HGB 12.3* 06/30/2013 1437   HCT 24.7* 04/10/2014 0631   HCT 35.4* 06/30/2013 1437   PLT 196 04/10/2014 0631   PLT 334 06/30/2013 1437   MCV 81.5 04/10/2014 0631   MCV 83.1 06/30/2013 1437   MCH 28.1 04/10/2014 0631   MCH 28.9 06/30/2013 1437   MCHC 34.4 04/10/2014 0631   MCHC 34.8 06/30/2013 1437   RDW 17.7* 04/10/2014 0631   RDW 16.8* 06/30/2013 1437   LYMPHSABS 0.9 04/08/2014 1927   LYMPHSABS 2.3 06/30/2013 1437   MONOABS 0.3 04/08/2014 1927   MONOABS 0.8 06/30/2013 1437   EOSABS 0.2 04/08/2014 1927   EOSABS 0.4 06/30/2013 1437   BASOSABS 0.0 04/08/2014 1927   BASOSABS 0.0 06/30/2013 1437   CMP     Component Value Date/Time   NA 139 04/09/2014 1125   NA 139 08/15/2013 1428   K 4.0 04/09/2014 1125   K 4.0 08/15/2013 1428   CL 107 04/09/2014 1125   CO2 27 04/09/2014 1125   CO2 29 08/15/2013 1428   GLUCOSE 116* 04/09/2014 1125   GLUCOSE 155* 08/15/2013 1428   BUN 12 04/09/2014 1125   BUN 14.3 08/15/2013 1428   CREATININE 0.95 04/09/2014 1125   CREATININE 1.2 08/15/2013 1428   CALCIUM 8.8 04/09/2014 1125   CALCIUM 9.8 08/15/2013 1428   PROT 6.0 04/08/2014 1927    PROT 7.3 06/30/2013 1438   ALBUMIN 3.5 04/08/2014 1927   ALBUMIN 3.9 06/30/2013 1438   AST 17 04/08/2014 1927   AST 13 06/30/2013 1438   ALT 10 04/08/2014 1927   ALT 11 06/30/2013 1438   ALKPHOS 111 04/08/2014 1927   ALKPHOS 147 06/30/2013 1438   BILITOT 0.7 04/08/2014 1927   BILITOT 0.59 06/30/2013 1438   GFRNONAA 89* 04/09/2014 1125   GFRAA >90 04/09/2014 1125   Assessment:  1.  Acute blood loss anemia, 2-gram drop in hemoglobin since yesterday. 2.  Hematochezia, seemingly lower GI tract, persistent but not destabilizing.  Last colonoscopy about 5 years ago in [place].  Plan:  1.  Supportive care with IV fluids, transfusion as needed. 2.  Clear liquids today. 3.  Colonoscopy tomorrow. 4.  Risks (bleeding, infection, bowel perforation that could require surgery, sedation-related changes in cardiopulmonary systems), benefits (identification and possible treatment of source of symptoms, exclusion of certain causes of symptoms), and alternatives (watchful waiting, radiographic imaging studies, empiric medical treatment) of colonoscopy were explained to patient/family in detail and patient wishes to proceed.   Melvin Hudson 04/10/2014, 10:18 AM

## 2014-04-12 ENCOUNTER — Encounter (HOSPITAL_COMMUNITY): Payer: Self-pay | Admitting: Gastroenterology

## 2014-04-12 ENCOUNTER — Inpatient Hospital Stay (HOSPITAL_COMMUNITY): Payer: Medicare Other

## 2014-04-12 LAB — BASIC METABOLIC PANEL
Anion gap: 3 — ABNORMAL LOW (ref 5–15)
CALCIUM: 7.9 mg/dL — AB (ref 8.4–10.5)
CHLORIDE: 107 mmol/L (ref 96–112)
CO2: 30 mmol/L (ref 19–32)
Creatinine, Ser: 0.96 mg/dL (ref 0.50–1.35)
GFR calc Af Amer: >90 mL/min (ref >90)
GFR calc non Af Amer: 88 mL/min — ABNORMAL LOW (ref >90)
Glucose, Bld: 100 mg/dL — ABNORMAL HIGH (ref 70–99)
Potassium: 3.2 mmol/L — ABNORMAL LOW (ref 3.5–5.1)
Sodium: 140 mmol/L (ref 135–145)

## 2014-04-12 LAB — TYPE AND SCREEN
ABO/RH(D): O POS
ANTIBODY SCREEN: NEGATIVE
UNIT DIVISION: 0
UNIT DIVISION: 0
UNIT DIVISION: 0
UNIT DIVISION: 0
Unit division: 0
Unit division: 0
Unit division: 0
Unit division: 0

## 2014-04-12 LAB — GLUCOSE, CAPILLARY
GLUCOSE-CAPILLARY: 93 mg/dL (ref 70–99)
GLUCOSE-CAPILLARY: 126 mg/dL — AB (ref 70–99)
Glucose-Capillary: 107 mg/dL — ABNORMAL HIGH (ref 70–99)
Glucose-Capillary: 111 mg/dL — ABNORMAL HIGH (ref 70–99)
Glucose-Capillary: 93 mg/dL (ref 70–99)
Glucose-Capillary: 93 mg/dL (ref 70–99)

## 2014-04-12 LAB — PREPARE FRESH FROZEN PLASMA
Unit division: 0
Unit division: 0

## 2014-04-12 LAB — CBC
HCT: 20.9 % — ABNORMAL LOW (ref 39.0–52.0)
HCT: 21.4 % — ABNORMAL LOW (ref 39.0–52.0)
HEMATOCRIT: 21.4 % — AB (ref 39.0–52.0)
HEMATOCRIT: 20.7 % — AB (ref 39.0–52.0)
HEMOGLOBIN: 7.4 g/dL — AB (ref 13.0–17.0)
Hemoglobin: 7.1 g/dL — ABNORMAL LOW (ref 13.0–17.0)
Hemoglobin: 7.2 g/dL — ABNORMAL LOW (ref 13.0–17.0)
Hemoglobin: 7.2 g/dL — ABNORMAL LOW (ref 13.0–17.0)
MCH: 28.9 pg (ref 26.0–34.0)
MCH: 27.7 pg (ref 26.0–34.0)
MCH: 29 pg (ref 26.0–34.0)
MCH: 27.8 pg (ref 26.0–34.0)
MCHC: 34.6 g/dL (ref 30.0–36.0)
MCHC: 34 g/dL (ref 30.0–36.0)
MCHC: 34.8 g/dL (ref 30.0–36.0)
MCHC: 33.6 g/dL (ref 30.0–36.0)
MCV: 83.6 fL (ref 78.0–100.0)
MCV: 81.6 fL (ref 78.0–100.0)
MCV: 83.5 fL (ref 78.0–100.0)
MCV: 82.6 fL (ref 78.0–100.0)
PLATELETS: 125 10*3/uL — AB (ref 150–400)
PLATELETS: 134 10*3/uL — AB (ref 150–400)
Platelets: 116 10*3/uL — ABNORMAL LOW (ref 150–400)
Platelets: 123 10*3/uL — ABNORMAL LOW (ref 150–400)
RBC: 2.56 MIL/uL — ABNORMAL LOW (ref 4.22–5.81)
RBC: 2.56 MIL/uL — AB (ref 4.22–5.81)
RBC: 2.48 MIL/uL — ABNORMAL LOW (ref 4.22–5.81)
RBC: 2.59 MIL/uL — AB (ref 4.22–5.81)
RDW: 16.8 % — ABNORMAL HIGH (ref 11.5–15.5)
RDW: 16.8 % — AB (ref 11.5–15.5)
RDW: 16.8 % — AB (ref 11.5–15.5)
RDW: 16.9 % — AB (ref 11.5–15.5)
WBC: 5.8 10*3/uL (ref 4.0–10.5)
WBC: 6 10*3/uL (ref 4.0–10.5)
WBC: 6.2 10*3/uL (ref 4.0–10.5)
WBC: 6 10*3/uL (ref 4.0–10.5)

## 2014-04-12 MED ORDER — POTASSIUM CHLORIDE 10 MEQ/50ML IV SOLN
10.0000 meq | INTRAVENOUS | Status: AC
  Administered 2014-04-12 (×2): 10 meq via INTRAVENOUS
  Filled 2014-04-12 (×2): qty 50

## 2014-04-12 MED ORDER — POTASSIUM CHLORIDE 20 MEQ/15ML (10%) PO SOLN
40.0000 meq | Freq: Two times a day (BID) | ORAL | Status: DC
  Filled 2014-04-12 (×2): qty 30

## 2014-04-12 MED ORDER — TECHNETIUM TC 99M-LABELED RED BLOOD CELLS IV KIT
25.0000 | PACK | Freq: Once | INTRAVENOUS | Status: AC | PRN
  Administered 2014-04-12: 25 via INTRAVENOUS

## 2014-04-12 NOTE — Progress Notes (Signed)
Patient refuses to be off bedpan. Advised patient that this could cause pressure related skin injury.  Patient still refused to get off bedpan due to urgency.  Patient able to reposition himself, will continue to prompt to reposition and attempt to get him off the bedpan if he will permit me too.

## 2014-04-12 NOTE — Progress Notes (Signed)
PT Cancellation Note  Patient Details Name: Melvin Hudson MRN: [id number] DOB: [birth year]   Cancelled Treatment:    Reason Eval/Treat Not Completed:  (pt declined). Pt c/o "I'm very tired and I keep shooting out blood so I"m on the bed pan." Pt HGb noted at 7.1. PT to return when pt HGB improves.   Kingsley Callander 04/12/2014, 10:50 AM   Kittie Plater, PT, DPT Pager #: [phone] Office #: [phone]

## 2014-04-12 NOTE — Progress Notes (Signed)
Subjective: Many bloody stools early yesterday. Only one small bloody stool since last night. Mild abdominal pain, without interval change since admission.  Objective: Vital signs in last 24 hours: Temp:  [97.3 F (36.3 C)-98.8 F (37.1 C)] 97.9 F (36.6 C) (02/24 0824) Pulse Rate:  [73-143] 112 (02/24 0500) Resp:  [9-29] 26 (02/24 0500) BP: (94-148)/(39-86) 148/71 mmHg (02/24 0500) SpO2:  [96 %-100 %] 100 % (02/24 0500) Weight change:  Last BM Date: 04/12/14  PE: GEN:  Overweight, NAD ABD:  Soft, mild generalized tenderness, active bowel sounds.  Lab Results: CBC    Component Value Date/Time   WBC 6.0 04/12/2014 0829   WBC 10.2 06/30/2013 1437   RBC 2.56* 04/12/2014 0829   RBC 4.26 06/30/2013 1437   HGB 7.1* 04/12/2014 0829   HGB 12.3* 06/30/2013 1437   HCT 20.9* 04/12/2014 0829   HCT 35.4* 06/30/2013 1437   PLT 125* 04/12/2014 0829   PLT 334 06/30/2013 1437   MCV 81.6 04/12/2014 0829   MCV 83.1 06/30/2013 1437   MCH 27.7 04/12/2014 0829   MCH 28.9 06/30/2013 1437   MCHC 34.0 04/12/2014 0829   MCHC 34.8 06/30/2013 1437   RDW 16.8* 04/12/2014 0829   RDW 16.8* 06/30/2013 1437   LYMPHSABS 0.9 04/08/2014 1927   LYMPHSABS 2.3 06/30/2013 1437   MONOABS 0.3 04/08/2014 1927   MONOABS 0.8 06/30/2013 1437   EOSABS 0.2 04/08/2014 1927   EOSABS 0.4 06/30/2013 1437   BASOSABS 0.0 04/08/2014 1927   BASOSABS 0.0 06/30/2013 1437   CMP     Component Value Date/Time   NA 140 04/12/2014 0829   NA 139 08/15/2013 1428   K 3.2* 04/12/2014 0829   K 4.0 08/15/2013 1428   CL 107 04/12/2014 0829   CO2 30 04/12/2014 0829   CO2 29 08/15/2013 1428   GLUCOSE 100* 04/12/2014 0829   GLUCOSE 155* 08/15/2013 1428   BUN PENDING 04/12/2014 0829   BUN 14.3 08/15/2013 1428   CREATININE 0.96 04/12/2014 0829   CREATININE 1.2 08/15/2013 1428   CALCIUM 7.9* 04/12/2014 0829   CALCIUM 9.8 08/15/2013 1428   PROT 6.0 04/08/2014 1927   PROT 7.3 06/30/2013 1438   ALBUMIN 3.5 04/08/2014 1927    ALBUMIN 3.9 06/30/2013 1438   AST 17 04/08/2014 1927   AST 13 06/30/2013 1438   ALT 10 04/08/2014 1927   ALT 11 06/30/2013 1438   ALKPHOS 111 04/08/2014 1927   ALKPHOS 147 06/30/2013 1438   BILITOT 0.7 04/08/2014 1927   BILITOT 0.59 06/30/2013 1438   GFRNONAA 88* 04/12/2014 0829   GFRAA >90 04/12/2014 0829   Angiogram;  Negative for source of bleeding  Assessment:  1. Acute blood loss anemia, stable over past 12 hours. 2. Hematochezia, seemingly lower GI tract, seems slowing down since yesterday morning. Suspect diverticular source.  Plan:  1.  Clear liquid diet. 2.  Follow Hgb; transfuse as needed. 3.  OK to transition out of ICU, from GI perspective. 4.  If patient has rebleeding, would do tagged RBC study as next step in management. 5.  Will follow.   Landry Dyke 04/12/2014, 9:31 AM

## 2014-04-12 NOTE — Progress Notes (Signed)
Referring Physician(s): Dr Paulita Fujita  Subjective:  Colonic bleed  Mesenteric arteriogram after colonoscopy 2/23 No source of bleeding identified Feeling better today Only one bloody bm since yesterday Stabilizing   Allergies: Advil and Azilsartan  Medications: Prior to Admission medications   Medication Sig Start Date End Date Taking? Authorizing Provider  aspirin EC 81 MG tablet Take 81 mg by mouth daily. resume   Yes Historical Provider, MD  Cholecalciferol (VITAMIN D-3 PO) Take 1,000 Units by mouth daily.   Yes Historical Provider, MD  olmesartan (BENICAR) 40 MG tablet Take 40 mg by mouth daily. 09/19/13  Yes Kristine Garbe, MD  oxyCODONE-acetaminophen (PERCOCET) 10-325 MG per tablet Take 1 tablet by mouth every 6 (six) hours as needed for pain. 11/11/13  Yes Eppie Gibson, MD  pentoxifylline (TRENTAL) 400 MG CR tablet Take 1 tab daily x 1 week, then take 1 tab BID thereafter 01/22/14  Yes Eppie Gibson, MD  polyethylene glycol [facility] / Floria Raveling) packet Take 17 g by mouth daily as needed (for constipation).    Yes Historical Provider, MD  spironolactone (ALDACTONE) 25 MG tablet Take 25 mg by mouth every morning.  09/18/11  Yes Debbe Odea, MD  tadalafil (CIALIS) 20 MG tablet Take 20 mg by mouth daily as needed for erectile dysfunction.    Yes Historical Provider, MD  vitamin E (VITAMIN E) 400 UNIT capsule Take 1 cap daily x 1 week, then take 1 cap BID thereafter 01/22/14  Yes Eppie Gibson, MD  Alum & Mag Hydroxide-Simeth (MAGIC MOUTHWASH W/LIDOCAINE) SOLN 1 part 2% viscous lidocaine, 1 part water. Swish, gargle, and/or swallow 61mL QID, prn sore mouth/throat. Use 23min before meals & bedtime. Patient not taking: Reported on 04/08/2014 08/08/13   Eppie Gibson, MD  diphenhydrAMINE (BENADRYL) 25 MG tablet Take 1 tablet (25 mg total) by mouth every 6 (six) hours. Patient not taking: Reported on 04/08/2014 10/29/13   Dorie Rank, MD     Vital Signs: BP 148/71 mmHg  Pulse 112  Temp(Src) 97.7  F (36.5 C) (Oral)  Resp 26  Ht 6\' 1"  (1.854 m)  Wt 133.539 kg (294 lb 6.4 oz)  BMI 38.85 kg/m2  SpO2 100%  Physical Exam  Abdominal:  Rt groin NT no bleeding No hematoma Clean and dry Rt foot 2+ pulses    Imaging: Nm Gi Blood Loss  04/12/2014   CLINICAL DATA:  Active lower GI bleed.  EXAM: NUCLEAR MEDICINE GASTROINTESTINAL BLEEDING SCAN  TECHNIQUE: Sequential abdominal images were obtained following intravenous administration of Tc-7m labeled red blood cells.  RADIOPHARMACEUTICALS:  25 mCi Tc-21m in-vitro labeled red cells.  COMPARISON:  None.  FINDINGS: There is radiotracer activity in the central pelvis. This is seen on initial image and is seen throughout the course of the exam. This does not conform to the bowel lumen, nor does it is change in location over the course of the exam. A lateral static image demonstrates activity anteriorly, this is consistent with penile activity. There is otherwise no evidence of active GI bleeding. Splenic activity is noted in the upper abdomen.  IMPRESSION: No scintigraphic localization of GI bleed. Prominent penile activity is noted in the pelvis.   Electronically Signed   By: Jeb Levering M.D.   On: 04/12/2014 07:15   Ir Angiogram Visceral Selective  04/11/2014   CLINICAL DATA:  59 year old male with acute lower GI bleed. Fresh clot and active bleeding was visualized by Dr. Paulita Fujita In the descending colon/sigmoid colon junction on colonoscopy. Patient presents to  interventional Radiology for urgent visceral angiography and possible embolization.  EXAM: SELECTIVE VISCERAL ARTERIOGRAPHY; IR ULTRASOUND GUIDANCE VASC ACCESS RIGHT  Date: 04/11/2014  PROCEDURE: 1. Ultrasound guided puncture of the right common femoral vein 2. Catheterization of the inferior mesenteric artery with arteriogram 3. Catheterization of the celicomesenteric trunk with arteriogram 4. Catheterization of the superior mesenteric artery with arteriogram 5. Catheterization of the  inferior mesenteric artery with injection of 200 mcg nitroglycerin followed by repeat arteriogram 6. Limited right common femoral arteriogram 7. Application of a Cordis Exoseal closure device Interventional Radiologist:  Criselda Peaches, MD  ANESTHESIA/SEDATION: Moderate (conscious) sedation was used. 1 mg Versed, 50 mcg Fentanyl were administered intravenously. The patient's vital signs were monitored continuously by radiology nursing throughout the procedure.  Sedation Time: 35 minutes  MEDICATIONS: 200 mcg nitroglycerin administered intra-arterially  FLUOROSCOPY TIME:  7 minutes 18 seconds  1221.7 mGy  CONTRAST:  197mL OMNIPAQUE IOHEXOL 300 MG/ML  SOLN  TECHNIQUE: Informed consent was obtained from the patient following explanation of the procedure, risks, benefits and alternatives. The patient understands, agrees and consents for the procedure. All questions were addressed. A time out was performed.  Maximal barrier sterile technique utilized including caps, mask, sterile gowns, sterile gloves, large sterile drape, hand hygiene, and Betadine skin prep.  The right groin was interrogated with ultrasound. The common femoral artery was identified. There is no significant atherosclerotic plaque. The vessels widely patent. An image was obtained and stored for the medical record. Local anesthesia was attained by infiltration with 1% lidocaine. A small dermatotomy was made. Under real-time sonographic guidance, the vessel was punctured with a 21 gauge micropuncture needle. Using the 4 [place] transitional micro sheath, the initial micro wire was exchanged for a 0.035 Bentson wire. A RIM catheter was then advanced into the abdominal aorta and used to select the inferior mesenteric artery.  The inferior mesenteric artery was selected first given the clinical history of a descending/sigmoid colonic diverticular bleed. Angiography of the entire arterial distribution was performed. There was excellent visualization from  the rectum to the splenic flexure. No evidence of active bleeding or vascular abnormality.  The rim catheter was then exchanged over a wire for a a C2 Cobra catheter. A C2 Cobra catheter was next used to select the SMA. However, the initial arteriogram demonstrates a that the vessel is in fact a celicomesenteric trunk. Hypertrophied collaterals are noted throughout the pancreaticoduodenal arcade and gastroduodenal artery.  Using a glidewire, the C2 catheter was advanced into the superior mesenteric artery. A superior mesenteric arteriogram was then performed. There was adequate visualization of the right and transverse colon to the level of the splenic flexure as well as throughout the small bowel. No evidence of active bleeding or arterial abnormality.  The C2 catheter was then exchanged for the RIM catheter which was used to re- select the inferior mesenteric artery. 200 mcg of nitroglycerin was then administered intra-arterially into the inferior mesenteric arterial bed. After a 1 minutes delay, repeat arteriography was performed. Again, no evidence of active bleeding although there is increased capillary blush throughout the entire left colon. No focal vascular abnormality.  A limited right common femoral arteriogram was next performed confirming the arterial access. Hemostasis was attained with the assistance of a Cordis ExoSeal extra arterial plug.  COMPLICATIONS: None  IMPRESSION: 1. No evidence of active bleeding or acute vascular abnormality despite provocation with Vasa dilator (200 mcg nitroglycerin into the IMA arterial bed). 2. Variant visceral artery anatomy. The celiac artery and superior  mesenteric artery share a common origin (celicomesenteric trunk).  PLAN: 1. If there is recurrent bleeding in the next 24 hours, recommend tagged red blood cell study to more fully localize the source of bleeding. 2. If recurrent bleeding occurs after 24 hours, recommend CTA of the abdomen and pelvis to localize the  source of bleeding. 3. If bleeding is brisk and localizes by either tagged red blood cell study or CTA, repeat arteriography could then be performed. Signed,  Criselda Peaches, MD  [facility]  [facility]   Electronically Signed   By: Jacqulynn Cadet M.D.   On: 04/11/2014 17:46   Ir Angiogram Visceral Selective  04/11/2014   CLINICAL DATA:  59 year old male with acute lower GI bleed. Fresh clot and active bleeding was visualized by Dr. Paulita Fujita In the descending colon/sigmoid colon junction on colonoscopy. Patient presents to interventional Radiology for urgent visceral angiography and possible embolization.  EXAM: SELECTIVE VISCERAL ARTERIOGRAPHY; IR ULTRASOUND GUIDANCE VASC ACCESS RIGHT  Date: 04/11/2014  PROCEDURE: 1. Ultrasound guided puncture of the right common femoral vein 2. Catheterization of the inferior mesenteric artery with arteriogram 3. Catheterization of the celicomesenteric trunk with arteriogram 4. Catheterization of the superior mesenteric artery with arteriogram 5. Catheterization of the inferior mesenteric artery with injection of 200 mcg nitroglycerin followed by repeat arteriogram 6. Limited right common femoral arteriogram 7. Application of a Cordis Exoseal closure device Interventional Radiologist:  Criselda Peaches, MD  ANESTHESIA/SEDATION: Moderate (conscious) sedation was used. 1 mg Versed, 50 mcg Fentanyl were administered intravenously. The patient's vital signs were monitored continuously by radiology nursing throughout the procedure.  Sedation Time: 35 minutes  MEDICATIONS: 200 mcg nitroglycerin administered intra-arterially  FLUOROSCOPY TIME:  7 minutes 18 seconds  1221.7 mGy  CONTRAST:  165mL OMNIPAQUE IOHEXOL 300 MG/ML  SOLN  TECHNIQUE: Informed consent was obtained from the patient following explanation of the procedure, risks, benefits and alternatives. The patient understands, agrees and consents for the procedure. All  questions were addressed. A time out was performed.  Maximal barrier sterile technique utilized including caps, mask, sterile gowns, sterile gloves, large sterile drape, hand hygiene, and Betadine skin prep.  The right groin was interrogated with ultrasound. The common femoral artery was identified. There is no significant atherosclerotic plaque. The vessels widely patent. An image was obtained and stored for the medical record. Local anesthesia was attained by infiltration with 1% lidocaine. A small dermatotomy was made. Under real-time sonographic guidance, the vessel was punctured with a 21 gauge micropuncture needle. Using the 4 [place] transitional micro sheath, the initial micro wire was exchanged for a 0.035 Bentson wire. A RIM catheter was then advanced into the abdominal aorta and used to select the inferior mesenteric artery.  The inferior mesenteric artery was selected first given the clinical history of a descending/sigmoid colonic diverticular bleed. Angiography of the entire arterial distribution was performed. There was excellent visualization from the rectum to the splenic flexure. No evidence of active bleeding or vascular abnormality.  The rim catheter was then exchanged over a wire for a a C2 Cobra catheter. A C2 Cobra catheter was next used to select the SMA. However, the initial arteriogram demonstrates a that the vessel is in fact a celicomesenteric trunk. Hypertrophied collaterals are noted throughout the pancreaticoduodenal arcade and gastroduodenal artery.  Using a glidewire, the C2 catheter was advanced into the superior mesenteric artery. A superior mesenteric arteriogram was then performed. There was adequate visualization of the right and transverse colon to the  level of the splenic flexure as well as throughout the small bowel. No evidence of active bleeding or arterial abnormality.  The C2 catheter was then exchanged for the RIM catheter which was used to re- select the inferior  mesenteric artery. 200 mcg of nitroglycerin was then administered intra-arterially into the inferior mesenteric arterial bed. After a 1 minutes delay, repeat arteriography was performed. Again, no evidence of active bleeding although there is increased capillary blush throughout the entire left colon. No focal vascular abnormality.  A limited right common femoral arteriogram was next performed confirming the arterial access. Hemostasis was attained with the assistance of a Cordis ExoSeal extra arterial plug.  COMPLICATIONS: None  IMPRESSION: 1. No evidence of active bleeding or acute vascular abnormality despite provocation with Vasa dilator (200 mcg nitroglycerin into the IMA arterial bed). 2. Variant visceral artery anatomy. The celiac artery and superior mesenteric artery share a common origin (celicomesenteric trunk).  PLAN: 1. If there is recurrent bleeding in the next 24 hours, recommend tagged red blood cell study to more fully localize the source of bleeding. 2. If recurrent bleeding occurs after 24 hours, recommend CTA of the abdomen and pelvis to localize the source of bleeding. 3. If bleeding is brisk and localizes by either tagged red blood cell study or CTA, repeat arteriography could then be performed. Signed,  Criselda Peaches, MD  [facility]  [facility]   Electronically Signed   By: Jacqulynn Cadet M.D.   On: 04/11/2014 17:46   Ir US Guide Vasc Access Right  04/11/2014   CLINICAL DATA:  59 year old male with acute lower GI bleed. Fresh clot and active bleeding was visualized by Dr. Paulita Fujita In the descending colon/sigmoid colon junction on colonoscopy. Patient presents to interventional Radiology for urgent visceral angiography and possible embolization.  EXAM: SELECTIVE VISCERAL ARTERIOGRAPHY; IR ULTRASOUND GUIDANCE VASC ACCESS RIGHT  Date: 04/11/2014  PROCEDURE: 1. Ultrasound guided puncture of the right common femoral vein 2. Catheterization of  the inferior mesenteric artery with arteriogram 3. Catheterization of the celicomesenteric trunk with arteriogram 4. Catheterization of the superior mesenteric artery with arteriogram 5. Catheterization of the inferior mesenteric artery with injection of 200 mcg nitroglycerin followed by repeat arteriogram 6. Limited right common femoral arteriogram 7. Application of a Cordis Exoseal closure device Interventional Radiologist:  Criselda Peaches, MD  ANESTHESIA/SEDATION: Moderate (conscious) sedation was used. 1 mg Versed, 50 mcg Fentanyl were administered intravenously. The patient's vital signs were monitored continuously by radiology nursing throughout the procedure.  Sedation Time: 35 minutes  MEDICATIONS: 200 mcg nitroglycerin administered intra-arterially  FLUOROSCOPY TIME:  7 minutes 18 seconds  1221.7 mGy  CONTRAST:  122mL OMNIPAQUE IOHEXOL 300 MG/ML  SOLN  TECHNIQUE: Informed consent was obtained from the patient following explanation of the procedure, risks, benefits and alternatives. The patient understands, agrees and consents for the procedure. All questions were addressed. A time out was performed.  Maximal barrier sterile technique utilized including caps, mask, sterile gowns, sterile gloves, large sterile drape, hand hygiene, and Betadine skin prep.  The right groin was interrogated with ultrasound. The common femoral artery was identified. There is no significant atherosclerotic plaque. The vessels widely patent. An image was obtained and stored for the medical record. Local anesthesia was attained by infiltration with 1% lidocaine. A small dermatotomy was made. Under real-time sonographic guidance, the vessel was punctured with a 21 gauge micropuncture needle. Using the 4 [place] transitional micro sheath, the initial micro wire was exchanged for a  0.035 Bentson wire. A RIM catheter was then advanced into the abdominal aorta and used to select the inferior mesenteric artery.  The inferior  mesenteric artery was selected first given the clinical history of a descending/sigmoid colonic diverticular bleed. Angiography of the entire arterial distribution was performed. There was excellent visualization from the rectum to the splenic flexure. No evidence of active bleeding or vascular abnormality.  The rim catheter was then exchanged over a wire for a a C2 Cobra catheter. A C2 Cobra catheter was next used to select the SMA. However, the initial arteriogram demonstrates a that the vessel is in fact a celicomesenteric trunk. Hypertrophied collaterals are noted throughout the pancreaticoduodenal arcade and gastroduodenal artery.  Using a glidewire, the C2 catheter was advanced into the superior mesenteric artery. A superior mesenteric arteriogram was then performed. There was adequate visualization of the right and transverse colon to the level of the splenic flexure as well as throughout the small bowel. No evidence of active bleeding or arterial abnormality.  The C2 catheter was then exchanged for the RIM catheter which was used to re- select the inferior mesenteric artery. 200 mcg of nitroglycerin was then administered intra-arterially into the inferior mesenteric arterial bed. After a 1 minutes delay, repeat arteriography was performed. Again, no evidence of active bleeding although there is increased capillary blush throughout the entire left colon. No focal vascular abnormality.  A limited right common femoral arteriogram was next performed confirming the arterial access. Hemostasis was attained with the assistance of a Cordis ExoSeal extra arterial plug.  COMPLICATIONS: None  IMPRESSION: 1. No evidence of active bleeding or acute vascular abnormality despite provocation with Vasa dilator (200 mcg nitroglycerin into the IMA arterial bed). 2. Variant visceral artery anatomy. The celiac artery and superior mesenteric artery share a common origin (celicomesenteric trunk).  PLAN: 1. If there is recurrent  bleeding in the next 24 hours, recommend tagged red blood cell study to more fully localize the source of bleeding. 2. If recurrent bleeding occurs after 24 hours, recommend CTA of the abdomen and pelvis to localize the source of bleeding. 3. If bleeding is brisk and localizes by either tagged red blood cell study or CTA, repeat arteriography could then be performed. Signed,  Criselda Peaches, MD  [facility]  [facility]   Electronically Signed   By: Jacqulynn Cadet M.D.   On: 04/11/2014 17:46   Dg Chest Port 1 View  04/11/2014   CLINICAL DATA:  Status post central line placement  EXAM: PORTABLE CHEST - 1 VIEW  COMPARISON:  None.  FINDINGS: Cardiac shadow is at the upper limits of normal in size. A right central venous line is noted with the catheter tip at the cavoatrial junction. No pneumothorax is seen. The lungs are clear.  IMPRESSION: Central line at the cavoatrial junction without pneumothorax. No acute abnormality is noted.   Electronically Signed   By: Inez Catalina M.D.   On: 04/11/2014 15:53    Labs:  CBC:  Recent Labs  04/11/14 1615 04/11/14 2014 04/12/14 0110 04/12/14 0829  WBC 6.3 6.1 5.8 6.0  HGB 7.1* 7.5* 7.4* 7.1*  HCT 20.7* 21.9* 21.4* 20.9*  PLT 124* 120* 116* 125*    COAGS:  Recent Labs  04/11/14 1145  INR 1.39  APTT 37    BMP:  Recent Labs  01/03/14 1137 04/08/14 1927 04/08/14 1935 04/09/14 1125 04/12/14 0829  NA 141 138 141 139 140  K 4.5 3.7 3.7 4.0 3.2*  CL 102 106 102 107 107  CO2 25 24  --  27 30  GLUCOSE 88 164* 161* 116* 100*  BUN 10 13 14 12  <5*  CALCIUM 9.6 8.4  --  8.8 7.9*  CREATININE 1.11 1.17 1.20 0.95 0.96  GFRNONAA 71* 66*  --  89* 88*  GFRAA 82* 77*  --  >90 >90    LIVER FUNCTION TESTS:  Recent Labs  06/30/13 1438 04/08/14 1927  BILITOT 0.59 0.7  AST 13 17  ALT 11 10  ALKPHOS 147 111  PROT 7.3 6.0  ALBUMIN 3.9 3.5    Assessment and Plan:  Colonic bleed seen in  colonoscopy 2/23 Mesenteric arteriogram did not show bleeding source No intervention 2/23 Call us if need [place]  Signed: Yochanan Eddleman A 04/12/2014, 12:28 PM   I spent a total of 15 Minutes in face to face in clinical consultation/evaluation, greater than 50% of which was counseling/coordinating care for mesenteric arteriogram

## 2014-04-12 NOTE — Progress Notes (Signed)
FPTS Social Note  S: Patient reports that he had several bloody stools yesterday.  He is eager to get out of bed.  We discussed that we need to get his blood levels up and get his symptoms under control before that can happen.  Patient otherwise, voices no complaints.  O: BP 148/71 mmHg  Pulse 112  Temp(Src) 98.6 F (37 C) (Oral)  Resp 26  Ht 6\' 1"  (1.854 m)  Wt 294 lb 6.4 oz (133.539 kg)  BMI 38.85 kg/m2  SpO2 100%   General: Awake, alert, Lying in bed, NAD, RN at bedside Respiratory: no increased WOB Abdomen: obese Neuro: AOx3, speech normal  A/P: Melvin Hudson is a 59 y.o. male that presented with GI bleed x2 days and fatigue x1 day . PMH is significant for Malignant neoplasm of tonsil, HTN, OSA, tobacco abuse.  He was transferred to the ICU yesterday after becoming hemodynamically unstable in the GI suite 2/2 GI bleed.  Hgb 7.1 this am.  Anticipate another transfusion.  Arteriogram revealed no active bleeds.  Patient to undergo a tagged RBC study today. -Appreciate excellent care being provided by CCM team.  Will accept patient back to FPTS when appropriate.  In the interim, will continue to follow socially.  Janora Norlander, DO 04/12/2014, 6:49 AM PGY-1, [place]

## 2014-04-13 ENCOUNTER — Inpatient Hospital Stay (HOSPITAL_COMMUNITY): Payer: Medicare Other

## 2014-04-13 LAB — GLUCOSE, CAPILLARY
GLUCOSE-CAPILLARY: 121 mg/dL — AB (ref 70–99)
Glucose-Capillary: 103 mg/dL — ABNORMAL HIGH (ref 70–99)
Glucose-Capillary: 86 mg/dL (ref 70–99)
Glucose-Capillary: 97 mg/dL (ref 70–99)

## 2014-04-13 LAB — CBC
HCT: 21.7 % — ABNORMAL LOW (ref 39.0–52.0)
HEMOGLOBIN: 7.3 g/dL — AB (ref 13.0–17.0)
MCH: 27.9 pg (ref 26.0–34.0)
MCHC: 33.6 g/dL (ref 30.0–36.0)
MCV: 82.8 fL (ref 78.0–100.0)
Platelets: 146 10*3/uL — ABNORMAL LOW (ref 150–400)
RBC: 2.62 MIL/uL — ABNORMAL LOW (ref 4.22–5.81)
RDW: 16.9 % — AB (ref 11.5–15.5)
WBC: 6.6 10*3/uL (ref 4.0–10.5)

## 2014-04-13 LAB — BASIC METABOLIC PANEL
Anion gap: 3 — ABNORMAL LOW (ref 5–15)
CO2: 30 mmol/L (ref 19–32)
Calcium: 8.3 mg/dL — ABNORMAL LOW (ref 8.4–10.5)
Chloride: 106 mmol/L (ref 96–112)
Creatinine, Ser: 0.97 mg/dL (ref 0.50–1.35)
GFR calc non Af Amer: 88 mL/min — ABNORMAL LOW (ref >90)
Glucose, Bld: 110 mg/dL — ABNORMAL HIGH (ref 70–99)
POTASSIUM: 3.1 mmol/L — AB (ref 3.5–5.1)
SODIUM: 139 mmol/L (ref 135–145)

## 2014-04-13 LAB — PREPARE RBC (CROSSMATCH)

## 2014-04-13 MED ORDER — SODIUM CHLORIDE 0.9 % IV SOLN: Freq: Once | INTRAVENOUS | Status: AC

## 2014-04-13 MED ORDER — SPIRONOLACTONE 25 MG PO TABS
25.0000 mg | ORAL_TABLET | Freq: Every day | ORAL | Status: DC
  Administered 2014-04-13 – 2014-04-15 (×3): 25 mg via ORAL
  Filled 2014-04-13 (×3): qty 1

## 2014-04-13 MED ORDER — POTASSIUM CHLORIDE CRYS ER 20 MEQ PO TBCR
40.0000 meq | EXTENDED_RELEASE_TABLET | Freq: Two times a day (BID) | ORAL | Status: AC
  Administered 2014-04-13 (×2): 40 meq via ORAL
  Filled 2014-04-13 (×2): qty 2

## 2014-04-13 NOTE — Progress Notes (Addendum)
**  Interim Note**  Spoke to Ms Beau Fanny, Mr Reffner' daughter [phone], with updates per his request.  Explained what tests have been done so far.  She voiced great appreciation for call and will let us know if she has any further questions.  Andrzej Scully M. Lajuana Ripple, DO PGY-1, [place]

## 2014-04-13 NOTE — Progress Notes (Signed)
Physical Therapy Treatment Patient Details Name: Melvin Hudson MRN: [id number] DOB: 1955/12/29 Today's Date: 05-10-2014    History of Present Illness Patient is a 59 yo male admitted 04/08/14 with GIB, symptomatic anemia (hypotension and weakness).  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 Pt with sats 95% on RA, HR 95-135 with activity, BP 141/74 EOB, 156/91 with gait. Pt encouraged to continue mobility with nursing, be up to toilet instead of bedpan and to continue HEp throughout the day. Will continue to follow to maximize function and independence for return home.   Follow Up Recommendations  No PT follow up     Equipment Recommendations  Rolling walker with 5" wheels;3in1 (PT)    Recommendations for Other Services       Precautions / Restrictions Precautions Precautions: Fall Restrictions Weight Bearing Restrictions: No    Mobility  Bed Mobility Overal bed mobility: Modified Independent                Transfers Overall transfer level: Needs assistance   Transfers: Sit to/from Stand Sit to Stand: Supervision         General transfer comment: Verbal cues for hand placement and technique.  No physical assist needed.  Ambulation/Gait Ambulation/Gait assistance: Supervision Ambulation Distance (Feet): 150 Feet Assistive device: Rolling walker (2 wheeled) Gait Pattern/deviations: Step-through pattern;Decreased stride length   Gait velocity interpretation: Below normal speed for age/gender General Gait Details: cues for posture and position in RW   Stairs            Wheelchair Mobility    Modified Rankin (Stroke Patients Only)       Balance Overall balance assessment: Needs assistance   Sitting balance-Leahy Scale: Good       Standing balance-Leahy Scale: Fair                      Cognition  Arousal/Alertness: Awake/alert Behavior During Therapy: WFL for tasks assessed/performed Overall Cognitive Status: Within Functional Limits for tasks assessed                      Exercises General Exercises - Lower Extremity Long Arc Quad: AROM;Seated;Both;20 reps Hip Flexion/Marching: AROM;Seated;Both;20 reps Toe Raises: AROM;Seated;Both;20 reps Heel Raises: AROM;Seated;Both;20 reps    General Comments        Pertinent Vitals/Pain Pain Assessment: No/denies pain    Home Living                      Prior Function            PT Goals (current goals can now be found in the care plan section) Progress towards PT goals: Progressing toward goals    Frequency       PT Plan Current plan remains appropriate    Co-evaluation             End of Session Equipment Utilized During Treatment: Gait belt Activity Tolerance: Patient tolerated treatment well Patient left: in chair;with call bell/phone within reach     Time: 0828-0852 PT Time Calculation (min) (ACUTE ONLY): 24 min  Charges:  $Gait Training: 8-22 mins $Therapeutic Exercise: 8-22 mins                    G Codes:      Melford Aase 2014/05/10, 9:40  AM Elwyn Reach, [place]

## 2014-04-13 NOTE — Progress Notes (Signed)
Patient transferred to 3S04. Receiving RN present. Pt VSS, no c/o pain or discomfort. Receiving RN placing patient on monitor.

## 2014-04-13 NOTE — Progress Notes (Signed)
Family Medicine Teaching Service Daily Progress Note Intern Pager: [phone]  Patient name: Melvin Hudson Medical record number: [id number] Date of birth: 12-Sep-1955 Age: 59 y.o. Gender: male  Primary Care Provider: Kristine Garbe, MD Consultants: GI Code Status: FULL  Pt Overview and Major Events to Date:  2/20: Transfused 2u pRBCs 2/23: Transfused 2 more units pRBCs, colonoscopy >>ICU 2/25: Out of ICU> FPTS  Assessment and Plan: Melvin Hudson is a 59 y.o. male presenting with GI bleed x2 days and fatigue x1 day . PMH is significant for Malignant neoplasm of tonsil, HTN, OSA, tobacco abuse  Hematochezia/ GI Bleed: S/p 7 units of pRBCs, 2 units FFPs.  Colonoscopy: suspect diverticular bleed but could not advance passed descending colon.  Mesenteric Angiogram inconclusive.  Tagged RBCs negative for localization. Hgb stable this am over the last 18 hours: 7.2>7.2>7.3.  -Telemetry -Vitals per floor protocol -c/s GI: following, appreciate recs: if rebleeding occurs repeat tagged RBC study. -Protonix BID -Tylenol PRN, Zofran PRN  -Threshold for transfusion <8.0. -transfuse 2 more units this am.  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 Sees Dr Isidore Moos for this. CT neck/chest scheduled for April 2016. Biopsy in 12/2013 negative for dysplasia or malignancy.  -Hold home Trental in setting of GI bleed. -Biotene, Vitamin E, MMW  HTN: hypotensive on admission. BP currently 166/57. HR 86. On aldactone, benicar at home.  -Will add back home Aldactone this morning -BMET in am  OSA: CPAP at home -CPAP QHS   Tobacco abuse: not currently a smoker -No nicotine patches needed  Chronic pain of LE: On Percocet 10 at home -Continue Percocet 10 PRN   FEN/GI: KVO, ADAT, PPI BID Prophylaxis: SCDs in setting of GI bleed  Disposition: Discharge home pending evaluation by GI and resolution of symptoms  Subjective:  Patient reports  that he had several stools yesterday but that they were small.  Denies abdominal pain, CP, SOB.  Has not been out of bed so not sure if dizzy or weak.  PT to work with him this am.  Objective: Temp:  [97.7 F (36.5 C)-98.7 F (37.1 C)] 98.1 F (36.7 C) (02/25 0742) Pulse Rate:  [70-96] 88 (02/25 0800) Resp:  [16-24] 18 (02/24 2000) BP: (115-166)/(57-97) 163/80 mmHg (02/25 0800) SpO2:  [89 %-100 %] 93 % (02/25 0800) Physical Exam: General: awake, alert, lying in bed, NAD, PT at bedside Cardiovascular: RRR, no m/r/g Respiratory: CTAB, no increased WOB Abdomen: obese, soft, NT/ND, +BS Extremities: WWP, no edema Neuro: AOx3, speech normal, no focal deficits, follows commands  Laboratory:  Recent Labs Lab 04/12/14 1454 04/12/14 1956 04/13/14 0343  WBC 6.2 6.0 6.6  HGB 7.2* 7.2* 7.3*  HCT 20.7* 21.4* 21.7*  PLT 123* 134* 146*    Recent Labs Lab 04/08/14 1927 04/08/14 1935 04/09/14 1125 04/12/14 0829  NA 138 141 139 140  K 3.7 3.7 4.0 3.2*  CL 106 102 107 107  CO2 24  --  27 30  BUN 13 14 12  <5*  CREATININE 1.17 1.20 0.95 0.96  CALCIUM 8.4  --  8.8 7.9*  PROT 6.0  --   --   --   BILITOT 0.7  --   --   --   ALKPHOS 111  --   --   --   ALT 10  --   --   --   AST 17  --   --   --   GLUCOSE 164* 161* 116* 100*  Imaging/Diagnostic Tests: Dg Abd 1 View  04/12/2014   CLINICAL DATA:  Right upper quadrant pain.  EXAM: ABDOMEN - 1 VIEW  COMPARISON:  None.  FINDINGS: The bowel gas pattern is normal. No radio-opaque calculi or other significant radiographic abnormality are seen.  IMPRESSION: Negative.   Electronically Signed   By: Rolm Baptise M.D.   On: 04/12/2014 12:57   Nm Gi Blood Loss  04/12/2014   CLINICAL DATA:  Active lower GI bleed.  EXAM: NUCLEAR MEDICINE GASTROINTESTINAL BLEEDING SCAN  TECHNIQUE: Sequential abdominal images were obtained following intravenous administration of Tc-62m labeled red blood cells.  RADIOPHARMACEUTICALS:  25 mCi Tc-78m in-vitro labeled  red cells.  COMPARISON:  None.  FINDINGS: There is radiotracer activity in the central pelvis. This is seen on initial image and is seen throughout the course of the exam. This does not conform to the bowel lumen, nor does it is change in location over the course of the exam. A lateral static image demonstrates activity anteriorly, this is consistent with penile activity. There is otherwise no evidence of active GI bleeding. Splenic activity is noted in the upper abdomen.  IMPRESSION: No scintigraphic localization of GI bleed. Prominent penile activity is noted in the pelvis.   Electronically Signed   By: Jeb Levering M.D.   On: 04/12/2014 07:15   Ir Angiogram Visceral Selective  04/11/2014   CLINICAL DATA:  59 year old male with acute lower GI bleed. Fresh clot and active bleeding was visualized by Dr. Paulita Fujita In the descending colon/sigmoid colon junction on colonoscopy. Patient presents to interventional Radiology for urgent visceral angiography and possible embolization.  EXAM: SELECTIVE VISCERAL ARTERIOGRAPHY; IR ULTRASOUND GUIDANCE VASC ACCESS RIGHT  Date: 04/11/2014  PROCEDURE: 1. Ultrasound guided puncture of the right common femoral vein 2. Catheterization of the inferior mesenteric artery with arteriogram 3. Catheterization of the celicomesenteric trunk with arteriogram 4. Catheterization of the superior mesenteric artery with arteriogram 5. Catheterization of the inferior mesenteric artery with injection of 200 mcg nitroglycerin followed by repeat arteriogram 6. Limited right common femoral arteriogram 7. Application of a Cordis Exoseal closure device Interventional Radiologist:  Criselda Peaches, MD  ANESTHESIA/SEDATION: Moderate (conscious) sedation was used. 1 mg Versed, 50 mcg Fentanyl were administered intravenously. The patient's vital signs were monitored continuously by radiology nursing throughout the procedure.  Sedation Time: 35 minutes  MEDICATIONS: 200 mcg nitroglycerin administered  intra-arterially  FLUOROSCOPY TIME:  7 minutes 18 seconds  1221.7 mGy  CONTRAST:  11mL OMNIPAQUE IOHEXOL 300 MG/ML  SOLN  TECHNIQUE: Informed consent was obtained from the patient following explanation of the procedure, risks, benefits and alternatives. The patient understands, agrees and consents for the procedure. All questions were addressed. A time out was performed.  Maximal barrier sterile technique utilized including caps, mask, sterile gowns, sterile gloves, large sterile drape, hand hygiene, and Betadine skin prep.  The right groin was interrogated with ultrasound. The common femoral artery was identified. There is no significant atherosclerotic plaque. The vessels widely patent. An image was obtained and stored for the medical record. Local anesthesia was attained by infiltration with 1% lidocaine. A small dermatotomy was made. Under real-time sonographic guidance, the vessel was punctured with a 21 gauge micropuncture needle. Using the 4 [place] transitional micro sheath, the initial micro wire was exchanged for a 0.035 Bentson wire. A RIM catheter was then advanced into the abdominal aorta and used to select the inferior mesenteric artery.  The inferior mesenteric artery was selected first given the clinical history of a  descending/sigmoid colonic diverticular bleed. Angiography of the entire arterial distribution was performed. There was excellent visualization from the rectum to the splenic flexure. No evidence of active bleeding or vascular abnormality.  The rim catheter was then exchanged over a wire for a a C2 Cobra catheter. A C2 Cobra catheter was next used to select the SMA. However, the initial arteriogram demonstrates a that the vessel is in fact a celicomesenteric trunk. Hypertrophied collaterals are noted throughout the pancreaticoduodenal arcade and gastroduodenal artery.  Using a glidewire, the C2 catheter was advanced into the superior mesenteric artery. A superior mesenteric arteriogram  was then performed. There was adequate visualization of the right and transverse colon to the level of the splenic flexure as well as throughout the small bowel. No evidence of active bleeding or arterial abnormality.  The C2 catheter was then exchanged for the RIM catheter which was used to re- select the inferior mesenteric artery. 200 mcg of nitroglycerin was then administered intra-arterially into the inferior mesenteric arterial bed. After a 1 minutes delay, repeat arteriography was performed. Again, no evidence of active bleeding although there is increased capillary blush throughout the entire left colon. No focal vascular abnormality.  A limited right common femoral arteriogram was next performed confirming the arterial access. Hemostasis was attained with the assistance of a Cordis ExoSeal extra arterial plug.  COMPLICATIONS: None  IMPRESSION: 1. No evidence of active bleeding or acute vascular abnormality despite provocation with Vasa dilator (200 mcg nitroglycerin into the IMA arterial bed). 2. Variant visceral artery anatomy. The celiac artery and superior mesenteric artery share a common origin (celicomesenteric trunk).  PLAN: 1. If there is recurrent bleeding in the next 24 hours, recommend tagged red blood cell study to more fully localize the source of bleeding. 2. If recurrent bleeding occurs after 24 hours, recommend CTA of the abdomen and pelvis to localize the source of bleeding. 3. If bleeding is brisk and localizes by either tagged red blood cell study or CTA, repeat arteriography could then be performed. Signed,  Criselda Peaches, MD  [facility]  [facility]   Electronically Signed   By: Jacqulynn Cadet M.D.   On: 04/11/2014 17:46   Ir Angiogram Visceral Selective  04/11/2014   CLINICAL DATA:  59 year old male with acute lower GI bleed. Fresh clot and active bleeding was visualized by Dr. Paulita Fujita In the descending colon/sigmoid colon  junction on colonoscopy. Patient presents to interventional Radiology for urgent visceral angiography and possible embolization.  EXAM: SELECTIVE VISCERAL ARTERIOGRAPHY; IR ULTRASOUND GUIDANCE VASC ACCESS RIGHT  Date: 04/11/2014  PROCEDURE: 1. Ultrasound guided puncture of the right common femoral vein 2. Catheterization of the inferior mesenteric artery with arteriogram 3. Catheterization of the celicomesenteric trunk with arteriogram 4. Catheterization of the superior mesenteric artery with arteriogram 5. Catheterization of the inferior mesenteric artery with injection of 200 mcg nitroglycerin followed by repeat arteriogram 6. Limited right common femoral arteriogram 7. Application of a Cordis Exoseal closure device Interventional Radiologist:  Criselda Peaches, MD  ANESTHESIA/SEDATION: Moderate (conscious) sedation was used. 1 mg Versed, 50 mcg Fentanyl were administered intravenously. The patient's vital signs were monitored continuously by radiology nursing throughout the procedure.  Sedation Time: 35 minutes  MEDICATIONS: 200 mcg nitroglycerin administered intra-arterially  FLUOROSCOPY TIME:  7 minutes 18 seconds  1221.7 mGy  CONTRAST:  161mL OMNIPAQUE IOHEXOL 300 MG/ML  SOLN  TECHNIQUE: Informed consent was obtained from the patient following explanation of the procedure, risks, benefits and alternatives. The patient  understands, agrees and consents for the procedure. All questions were addressed. A time out was performed.  Maximal barrier sterile technique utilized including caps, mask, sterile gowns, sterile gloves, large sterile drape, hand hygiene, and Betadine skin prep.  The right groin was interrogated with ultrasound. The common femoral artery was identified. There is no significant atherosclerotic plaque. The vessels widely patent. An image was obtained and stored for the medical record. Local anesthesia was attained by infiltration with 1% lidocaine. A small dermatotomy was made. Under real-time  sonographic guidance, the vessel was punctured with a 21 gauge micropuncture needle. Using the 4 [place] transitional micro sheath, the initial micro wire was exchanged for a 0.035 Bentson wire. A RIM catheter was then advanced into the abdominal aorta and used to select the inferior mesenteric artery.  The inferior mesenteric artery was selected first given the clinical history of a descending/sigmoid colonic diverticular bleed. Angiography of the entire arterial distribution was performed. There was excellent visualization from the rectum to the splenic flexure. No evidence of active bleeding or vascular abnormality.  The rim catheter was then exchanged over a wire for a a C2 Cobra catheter. A C2 Cobra catheter was next used to select the SMA. However, the initial arteriogram demonstrates a that the vessel is in fact a celicomesenteric trunk. Hypertrophied collaterals are noted throughout the pancreaticoduodenal arcade and gastroduodenal artery.  Using a glidewire, the C2 catheter was advanced into the superior mesenteric artery. A superior mesenteric arteriogram was then performed. There was adequate visualization of the right and transverse colon to the level of the splenic flexure as well as throughout the small bowel. No evidence of active bleeding or arterial abnormality.  The C2 catheter was then exchanged for the RIM catheter which was used to re- select the inferior mesenteric artery. 200 mcg of nitroglycerin was then administered intra-arterially into the inferior mesenteric arterial bed. After a 1 minutes delay, repeat arteriography was performed. Again, no evidence of active bleeding although there is increased capillary blush throughout the entire left colon. No focal vascular abnormality.  A limited right common femoral arteriogram was next performed confirming the arterial access. Hemostasis was attained with the assistance of a Cordis ExoSeal extra arterial plug.  COMPLICATIONS: None  IMPRESSION: 1.  No evidence of active bleeding or acute vascular abnormality despite provocation with Vasa dilator (200 mcg nitroglycerin into the IMA arterial bed). 2. Variant visceral artery anatomy. The celiac artery and superior mesenteric artery share a common origin (celicomesenteric trunk).  PLAN: 1. If there is recurrent bleeding in the next 24 hours, recommend tagged red blood cell study to more fully localize the source of bleeding. 2. If recurrent bleeding occurs after 24 hours, recommend CTA of the abdomen and pelvis to localize the source of bleeding. 3. If bleeding is brisk and localizes by either tagged red blood cell study or CTA, repeat arteriography could then be performed. Signed,  Criselda Peaches, MD  [facility]  [facility]   Electronically Signed   By: Jacqulynn Cadet M.D.   On: 04/11/2014 17:46   Ir US Guide Vasc Access Right  04/11/2014   CLINICAL DATA:  59 year old male with acute lower GI bleed. Fresh clot and active bleeding was visualized by Dr. Paulita Fujita In the descending colon/sigmoid colon junction on colonoscopy. Patient presents to interventional Radiology for urgent visceral angiography and possible embolization.  EXAM: SELECTIVE VISCERAL ARTERIOGRAPHY; IR ULTRASOUND GUIDANCE VASC ACCESS RIGHT  Date: 04/11/2014  PROCEDURE: 1. Ultrasound guided puncture  of the right common femoral vein 2. Catheterization of the inferior mesenteric artery with arteriogram 3. Catheterization of the celicomesenteric trunk with arteriogram 4. Catheterization of the superior mesenteric artery with arteriogram 5. Catheterization of the inferior mesenteric artery with injection of 200 mcg nitroglycerin followed by repeat arteriogram 6. Limited right common femoral arteriogram 7. Application of a Cordis Exoseal closure device Interventional Radiologist:  Criselda Peaches, MD  ANESTHESIA/SEDATION: Moderate (conscious) sedation was used. 1 mg Versed, 50 mcg Fentanyl were  administered intravenously. The patient's vital signs were monitored continuously by radiology nursing throughout the procedure.  Sedation Time: 35 minutes  MEDICATIONS: 200 mcg nitroglycerin administered intra-arterially  FLUOROSCOPY TIME:  7 minutes 18 seconds  1221.7 mGy  CONTRAST:  148mL OMNIPAQUE IOHEXOL 300 MG/ML  SOLN  TECHNIQUE: Informed consent was obtained from the patient following explanation of the procedure, risks, benefits and alternatives. The patient understands, agrees and consents for the procedure. All questions were addressed. A time out was performed.  Maximal barrier sterile technique utilized including caps, mask, sterile gowns, sterile gloves, large sterile drape, hand hygiene, and Betadine skin prep.  The right groin was interrogated with ultrasound. The common femoral artery was identified. There is no significant atherosclerotic plaque. The vessels widely patent. An image was obtained and stored for the medical record. Local anesthesia was attained by infiltration with 1% lidocaine. A small dermatotomy was made. Under real-time sonographic guidance, the vessel was punctured with a 21 gauge micropuncture needle. Using the 4 [place] transitional micro sheath, the initial micro wire was exchanged for a 0.035 Bentson wire. A RIM catheter was then advanced into the abdominal aorta and used to select the inferior mesenteric artery.  The inferior mesenteric artery was selected first given the clinical history of a descending/sigmoid colonic diverticular bleed. Angiography of the entire arterial distribution was performed. There was excellent visualization from the rectum to the splenic flexure. No evidence of active bleeding or vascular abnormality.  The rim catheter was then exchanged over a wire for a a C2 Cobra catheter. A C2 Cobra catheter was next used to select the SMA. However, the initial arteriogram demonstrates a that the vessel is in fact a celicomesenteric trunk. Hypertrophied  collaterals are noted throughout the pancreaticoduodenal arcade and gastroduodenal artery.  Using a glidewire, the C2 catheter was advanced into the superior mesenteric artery. A superior mesenteric arteriogram was then performed. There was adequate visualization of the right and transverse colon to the level of the splenic flexure as well as throughout the small bowel. No evidence of active bleeding or arterial abnormality.  The C2 catheter was then exchanged for the RIM catheter which was used to re- select the inferior mesenteric artery. 200 mcg of nitroglycerin was then administered intra-arterially into the inferior mesenteric arterial bed. After a 1 minutes delay, repeat arteriography was performed. Again, no evidence of active bleeding although there is increased capillary blush throughout the entire left colon. No focal vascular abnormality.  A limited right common femoral arteriogram was next performed confirming the arterial access. Hemostasis was attained with the assistance of a Cordis ExoSeal extra arterial plug.  COMPLICATIONS: None  IMPRESSION: 1. No evidence of active bleeding or acute vascular abnormality despite provocation with Vasa dilator (200 mcg nitroglycerin into the IMA arterial bed). 2. Variant visceral artery anatomy. The celiac artery and superior mesenteric artery share a common origin (celicomesenteric trunk).  PLAN: 1. If there is recurrent bleeding in the next 24 hours, recommend tagged red blood cell study to more  fully localize the source of bleeding. 2. If recurrent bleeding occurs after 24 hours, recommend CTA of the abdomen and pelvis to localize the source of bleeding. 3. If bleeding is brisk and localizes by either tagged red blood cell study or CTA, repeat arteriography could then be performed. Signed,  Criselda Peaches, MD  [facility]  [facility]   Electronically Signed   By: Jacqulynn Cadet M.D.   On: 04/11/2014 17:46    Dg Chest Port 1 View  04/13/2014   CLINICAL DATA:  Acute transfusion related lung injury  EXAM: PORTABLE CHEST - 1 VIEW  COMPARISON:  Portable chest x-ray of April 11, 2014  FINDINGS: The lungs are reasonably well inflated. The interstitial markings are coarse. There is no alveolar infiltrate. There is no pleural effusion or pneumothorax. The cardiac silhouette is mildly enlarged. The central pulmonary vascularity is engorged. There is tortuosity of the descending thoracic aorta. The right internal jugular venous catheter tip projects over the distal third of the SVC.  IMPRESSION: Slightly increased prominence of the pulmonary interstitium may reflect mild edema. There is no pneumonia nor significant pleural effusion.   Electronically Signed   By: David  Martinique   On: 04/13/2014 07:40   Dg Chest Port 1 View  04/11/2014   CLINICAL DATA:  Status post central line placement  EXAM: PORTABLE CHEST - 1 VIEW  COMPARISON:  None.  FINDINGS: Cardiac shadow is at the upper limits of normal in size. A right central venous line is noted with the catheter tip at the cavoatrial junction. No pneumothorax is seen. The lungs are clear.  IMPRESSION: Central line at the cavoatrial junction without pneumothorax. No acute abnormality is noted.   Electronically Signed   By: Inez Catalina M.D.   On: 04/11/2014 15:53     Janora Norlander, DO 04/13/2014, 9:17 AM PGY-1, [place] Intern pager: [phone], text pages welcome

## 2014-04-13 NOTE — Progress Notes (Signed)
Pt arrived to unit accompanied by RN. Pt oriented to room/unit. No s/s of acute distress noted.

## 2014-04-13 NOTE — Progress Notes (Signed)
Subjective: One small bowel movement over past 24 hours, per nursing seemed like old blood. No abdominal pain. Is hungry.  Objective: Vital signs in last 24 hours: Temp:  [97.7 F (36.5 C)-98.7 F (37.1 C)] 98.1 F (36.7 C) (02/25 0742) Pulse Rate:  [70-96] 96 (02/25 0900) Resp:  [18-24] 18 (02/24 2000) BP: (115-166)/(57-97) 163/80 mmHg (02/25 0800) SpO2:  [89 %-100 %] 94 % (02/25 0900) Weight change:  Last BM Date: 04/12/14  PE: GEN:  NAD, overweight ABD:  Soft, non-tender, protuberant  Lab Results: CBC    Component Value Date/Time   WBC 6.6 04/13/2014 0343   WBC 10.2 06/30/2013 1437   RBC 2.62* 04/13/2014 0343   RBC 4.26 06/30/2013 1437   HGB 7.3* 04/13/2014 0343   HGB 12.3* 06/30/2013 1437   HCT 21.7* 04/13/2014 0343   HCT 35.4* 06/30/2013 1437   PLT 146* 04/13/2014 0343   PLT 334 06/30/2013 1437   MCV 82.8 04/13/2014 0343   MCV 83.1 06/30/2013 1437   MCH 27.9 04/13/2014 0343   MCH 28.9 06/30/2013 1437   MCHC 33.6 04/13/2014 0343   MCHC 34.8 06/30/2013 1437   RDW 16.9* 04/13/2014 0343   RDW 16.8* 06/30/2013 1437   LYMPHSABS 0.9 04/08/2014 1927   LYMPHSABS 2.3 06/30/2013 1437   MONOABS 0.3 04/08/2014 1927   MONOABS 0.8 06/30/2013 1437   EOSABS 0.2 04/08/2014 1927   EOSABS 0.4 06/30/2013 1437   BASOSABS 0.0 04/08/2014 1927   BASOSABS 0.0 06/30/2013 1437   CMP     Component Value Date/Time   NA 140 04/12/2014 0829   NA 139 08/15/2013 1428   K 3.2* 04/12/2014 0829   K 4.0 08/15/2013 1428   CL 107 04/12/2014 0829   CO2 30 04/12/2014 0829   CO2 29 08/15/2013 1428   GLUCOSE 100* 04/12/2014 0829   GLUCOSE 155* 08/15/2013 1428   BUN <5* 04/12/2014 0829   BUN 14.3 08/15/2013 1428   CREATININE 0.96 04/12/2014 0829   CREATININE 1.2 08/15/2013 1428   CALCIUM 7.9* 04/12/2014 0829   CALCIUM 9.8 08/15/2013 1428   PROT 6.0 04/08/2014 1927   PROT 7.3 06/30/2013 1438   ALBUMIN 3.5 04/08/2014 1927   ALBUMIN 3.9 06/30/2013 1438   AST 17 04/08/2014 1927   AST  13 06/30/2013 1438   ALT 10 04/08/2014 1927   ALT 11 06/30/2013 1438   ALKPHOS 111 04/08/2014 1927   ALKPHOS 147 06/30/2013 1438   BILITOT 0.7 04/08/2014 1927   BILITOT 0.59 06/30/2013 1438   GFRNONAA 88* 04/12/2014 0829   GFRAA >90 04/12/2014 0829   Assessment:  1. Acute blood loss anemia, stable. 2. Hematochezia, seemingly lower GI tract, suspect one bloody stool over past 24 hours is likely old blood.  Don't suspect active bleeding at this time. Suspect diverticular source.  Plan:  1.  Agree with advancing diet as tolerated. 2.  Follow serial CBCs; transfusion today is planned per nursing. 3.  If patient has rebleeding, would consider tagged RBC study as next step in management. 4.  Will follow.   Landry Dyke 04/13/2014, 10:12 AM

## 2014-04-14 LAB — TYPE AND SCREEN
ABO/RH(D): O POS
Antibody Screen: NEGATIVE
UNIT DIVISION: 0
UNIT DIVISION: 0

## 2014-04-14 LAB — BASIC METABOLIC PANEL
Anion gap: 6 (ref 5–15)
BUN: 5 mg/dL — ABNORMAL LOW (ref 6–23)
CALCIUM: 8.1 mg/dL — AB (ref 8.4–10.5)
CO2: 28 mmol/L (ref 19–32)
CREATININE: 1.07 mg/dL (ref 0.50–1.35)
Chloride: 104 mmol/L (ref 96–112)
GFR calc Af Amer: 85 mL/min — ABNORMAL LOW (ref >90)
GFR calc non Af Amer: 74 mL/min — ABNORMAL LOW (ref >90)
Glucose, Bld: 99 mg/dL (ref 70–99)
Potassium: 3.4 mmol/L — ABNORMAL LOW (ref 3.5–5.1)
Sodium: 138 mmol/L (ref 135–145)

## 2014-04-14 LAB — CBC
HCT: 26.2 % — ABNORMAL LOW (ref 39.0–52.0)
HCT: 24.2 % — ABNORMAL LOW (ref 39.0–52.0)
HCT: 26.7 % — ABNORMAL LOW (ref 39.0–52.0)
HEMATOCRIT: 26.8 % — AB (ref 39.0–52.0)
HEMOGLOBIN: 8.8 g/dL — AB (ref 13.0–17.0)
Hemoglobin: 8.1 g/dL — ABNORMAL LOW (ref 13.0–17.0)
Hemoglobin: 9 g/dL — ABNORMAL LOW (ref 13.0–17.0)
Hemoglobin: 9 g/dL — ABNORMAL LOW (ref 13.0–17.0)
MCH: 27.8 pg (ref 26.0–34.0)
MCH: 27.8 pg (ref 26.0–34.0)
MCH: 28.5 pg (ref 26.0–34.0)
MCH: 28.5 pg (ref 26.0–34.0)
MCHC: 33.6 g/dL (ref 30.0–36.0)
MCHC: 33.5 g/dL (ref 30.0–36.0)
MCHC: 33.7 g/dL (ref 30.0–36.0)
MCHC: 33.6 g/dL (ref 30.0–36.0)
MCV: 82.6 fL (ref 78.0–100.0)
MCV: 83.2 fL (ref 78.0–100.0)
MCV: 84.5 fL (ref 78.0–100.0)
MCV: 84.8 fL (ref 78.0–100.0)
Platelets: 171 10*3/uL (ref 150–400)
Platelets: 155 10*3/uL (ref 150–400)
Platelets: 168 10*3/uL (ref 150–400)
Platelets: 176 10*3/uL (ref 150–400)
RBC: 3.17 MIL/uL — AB (ref 4.22–5.81)
RBC: 2.91 MIL/uL — ABNORMAL LOW (ref 4.22–5.81)
RBC: 3.16 MIL/uL — ABNORMAL LOW (ref 4.22–5.81)
RBC: 3.16 MIL/uL — ABNORMAL LOW (ref 4.22–5.81)
RDW: 17.5 % — ABNORMAL HIGH (ref 11.5–15.5)
RDW: 17.6 % — ABNORMAL HIGH (ref 11.5–15.5)
RDW: 17.5 % — ABNORMAL HIGH (ref 11.5–15.5)
RDW: 17.8 % — ABNORMAL HIGH (ref 11.5–15.5)
WBC: 7.3 10*3/uL (ref 4.0–10.5)
WBC: 6.1 10*3/uL (ref 4.0–10.5)
WBC: 6.2 10*3/uL (ref 4.0–10.5)
WBC: 6.1 10*3/uL (ref 4.0–10.5)

## 2014-04-14 MED ORDER — OXYCODONE-ACETAMINOPHEN 10-325 MG PO TABS
1.0000 | ORAL_TABLET | Freq: Four times a day (QID) | ORAL | Status: DC | PRN
  Filled 2014-04-14: qty 1

## 2014-04-14 MED ORDER — ADULT MULTIVITAMIN W/MINERALS CH
1.0000 | ORAL_TABLET | Freq: Every day | ORAL | Status: DC
  Administered 2014-04-14 – 2014-04-15 (×2): 1 via ORAL
  Filled 2014-04-14 (×2): qty 1

## 2014-04-14 MED ORDER — SODIUM CHLORIDE 0.9 % IJ SOLN
10.0000 mL | INTRAMUSCULAR | Status: DC | PRN
  Administered 2014-04-14: 10 mL
  Administered 2014-04-15 (×2): 30 mL
  Filled 2014-04-14 (×2): qty 40

## 2014-04-14 MED ORDER — POTASSIUM CHLORIDE CRYS ER 20 MEQ PO TBCR
40.0000 meq | EXTENDED_RELEASE_TABLET | Freq: Once | ORAL | Status: AC
  Administered 2014-04-14: 40 meq via ORAL
  Filled 2014-04-14: qty 2

## 2014-04-14 MED ORDER — OXYCODONE-ACETAMINOPHEN 5-325 MG PO TABS
1.0000 | ORAL_TABLET | Freq: Four times a day (QID) | ORAL | Status: DC | PRN
  Administered 2014-04-14 – 2014-04-15 (×3): 1 via ORAL
  Filled 2014-04-14 (×3): qty 1

## 2014-04-14 MED ORDER — OXYCODONE HCL 5 MG PO TABS
5.0000 mg | ORAL_TABLET | Freq: Four times a day (QID) | ORAL | Status: DC | PRN
  Administered 2014-04-14 – 2014-04-15 (×3): 5 mg via ORAL
  Filled 2014-04-14 (×3): qty 1

## 2014-04-14 NOTE — Progress Notes (Signed)
Medicare Important Message given? YES  (If response is "NO", the following Medicare IM given date fields will be blank)  Date Medicare IM given: 04/14/14 Medicare IM given by:  Vinette Crites  

## 2014-04-14 NOTE — Progress Notes (Signed)
Pt admitted from [facility] via wheelchair. Placed on telemetry. VSS. Oriented to room and call bell.

## 2014-04-14 NOTE — Progress Notes (Signed)
Subjective: No bleeding in past 24 hours. Tolerating liquid diet. No abdominal pain.  Objective: Vital signs in last 24 hours: Temp:  [97.4 F (36.3 C)-98.9 F (37.2 C)] 98.1 F (36.7 C) (02/26 0741) Pulse Rate:  [72-111] 79 (02/26 0741) Resp:  [14-28] 18 (02/26 0741) BP: (107-141)/(58-93) 140/93 mmHg (02/26 0741) SpO2:  [91 %-100 %] 94 % (02/26 0741) Weight change:  Last BM Date: 04/13/14  PE: GEN:  NAD, overweight ABD:  Soft, non-tender, protuberant  Lab Results: CBC    Component Value Date/Time   WBC 6.2 04/14/2014 0853   WBC 10.2 06/30/2013 1437   RBC 3.16* 04/14/2014 0853   RBC 4.26 06/30/2013 1437   HGB 9.0* 04/14/2014 0853   HGB 12.3* 06/30/2013 1437   HCT 26.7* 04/14/2014 0853   HCT 35.4* 06/30/2013 1437   PLT 168 04/14/2014 0853   PLT 334 06/30/2013 1437   MCV 84.5 04/14/2014 0853   MCV 83.1 06/30/2013 1437   MCH 28.5 04/14/2014 0853   MCH 28.9 06/30/2013 1437   MCHC 33.7 04/14/2014 0853   MCHC 34.8 06/30/2013 1437   RDW 17.5* 04/14/2014 0853   RDW 16.8* 06/30/2013 1437   LYMPHSABS 0.9 04/08/2014 1927   LYMPHSABS 2.3 06/30/2013 1437   MONOABS 0.3 04/08/2014 1927   MONOABS 0.8 06/30/2013 1437   EOSABS 0.2 04/08/2014 1927   EOSABS 0.4 06/30/2013 1437   BASOSABS 0.0 04/08/2014 1927   BASOSABS 0.0 06/30/2013 1437    Assessment:  1. Acute blood loss anemia, stable. 2. Hematochezia, seemingly lower GI tract, suspect one bloody stool over past 24 hours is likely old blood. Don't suspect active bleeding at this time. Suspect diverticular source.  Plan:  1.  Agree with Heart Healthy diet. 2.  Follow CBCs. 3.  Out-of-bed to chair, ambulate halls as tolerated. 4.  If no further bleeding today, patient should be able to be discharged home tomorrow from GI perspective. 5.  Will sign-off; please call with questions; thank you for the consult.   Landry Dyke 04/14/2014, 9:39 AM

## 2014-04-14 NOTE — Progress Notes (Signed)
Family Medicine Teaching Service Daily Progress Note Intern Pager: [phone]  Patient name: Melvin Hudson Medical record number: [id number] Date of birth: 04/21/1955 Age: 59 y.o. Gender: male  Primary Care Provider: Kristine Garbe, MD Consultants: GI Code Status: FULL  Pt Overview and Major Events to Date:  2/20: Transfused 2u pRBCs 2/23: Transfused 2 more units pRBCs, colonoscopy >>ICU 2/25: Out of ICU> SDU: FPTS  Assessment and Plan: Melvin Hudson is a 59 y.o. male presenting with GI bleed x2 days and fatigue x1 day . PMH is significant for Malignant neoplasm of tonsil, HTN, OSA, tobacco abuse  Hematochezia/ GI Bleed: S/p (total) 9 units of pRBCs, 2 units FFPs.  Colonoscopy: suspect diverticular bleed but could not advance passed descending colon.  Mesenteric Angiogram inconclusive.  Tagged RBCs negative for localization. Hgb 8.8>8.1> 9.0. S/p 2 units pRBCs yesterday evening. -Telemetry -Vitals per floor protocol -c/s GI: following, appreciate recs: if rebleeding occurs repeat tagged RBC study. -Will need follow up with GI.  Consider repeat colonoscopy. -Protonix BID -Tylenol PRN, Zofran PRN  -Threshold for transfusion <8.0. -Repeat orthostatic vital signs -Repeat CBC @3pm .  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 Sees Dr Isidore Moos for this. CT neck/chest scheduled for April 2016. Biopsy in 12/2013 negative for dysplasia or malignancy.  -Hold home Trental in setting of GI bleed. -Biotene, Vitamin E, MMW  HTN: hypotensive on admission. BP currently 141/86. HR 72. On aldactone, benicar at home.  -Continue home aldactone  OSA: CPAP at home -CPAP QHS   Tobacco abuse: not currently a smoker -No nicotine patches needed  Chronic pain of LE: On Percocet 10 at home -Continue Percocet 10 PRN   FEN/GI: KVO, Heart healthy diet, PPI BID Prophylaxis: SCDs in setting of GI bleed  Disposition: Discharge  home pending stable hemoglobin and continued resolution of symptoms.  Hopefully tomorrow.    Subjective:  Patient reports that he is feeling well this am.  He states that he has not had a BM since yesterday in the ICU.  Denies n/v/abdominal pain, dizziness, CP, SOB, weakness.  Patient reports that he has been walking around the halls with the RN.    Objective: Temp:  [97.4 F (36.3 C)-98.9 F (37.2 C)] 97.7 F (36.5 C) (02/26 0333) Pulse Rate:  [72-111] 72 (02/26 0333) Resp:  [14-28] 22 (02/26 0333) BP: (107-163)/(58-92) 141/86 mmHg (02/26 0333) SpO2:  [91 %-100 %] 91 % (02/26 0333) Physical Exam: General: awake, alert, sitting up at bedside having breakfast, NAD, RN at bedside. HENT: [place]/AT, EOMI, pale conjunctiva, o/p poor dentition. Cardiovascular: RRR, no m/r/g Respiratory: CTAB, no increased WOB Abdomen: obese, soft, NT/ND, +BS Extremities: WWP, no edema Neuro: AOx3, speech normal, no focal deficits, follows commands  Laboratory:  Recent Labs Lab 04/13/14 0343 04/13/14 2055 04/14/14 0206  WBC 6.6 7.3 6.1  HGB 7.3* 8.8* 8.1*  HCT 21.7* 26.2* 24.2*  PLT 146* 171 155    Recent Labs Lab 04/08/14 1927  04/12/14 0829 04/13/14 0930 04/14/14 0206  NA 138  < > 140 139 138  K 3.7  < > 3.2* 3.1* 3.4*  CL 106  < > 107 106 104  CO2 24  < > 30 30 28   BUN 13  < > <5* <5* 5*  CREATININE 1.17  < > 0.96 0.97 1.07  CALCIUM 8.4  < > 7.9* 8.3* 8.1*  PROT 6.0  --   --   --   --  BILITOT 0.7  --   --   --   --   ALKPHOS 111  --   --   --   --   ALT 10  --   --   --   --   AST 17  --   --   --   --   GLUCOSE 164*  < > 100* 110* 99  < > = values in this interval not displayed.  Imaging/Diagnostic Tests: Dg Abd 1 View  04/12/2014   CLINICAL DATA:  Right upper quadrant pain.  EXAM: ABDOMEN - 1 VIEW  COMPARISON:  None.  FINDINGS: The bowel gas pattern is normal. No radio-opaque calculi or other significant radiographic abnormality are seen.  IMPRESSION: Negative.   Electronically  Signed   By: Melvin Hudson M.D.   On: 04/12/2014 12:57   Dg Chest Port 1 View  04/13/2014   CLINICAL DATA:  Acute transfusion related lung injury  EXAM: PORTABLE CHEST - 1 VIEW  COMPARISON:  Portable chest x-ray of April 11, 2014  FINDINGS: The lungs are reasonably well inflated. The interstitial markings are coarse. There is no alveolar infiltrate. There is no pleural effusion or pneumothorax. The cardiac silhouette is mildly enlarged. The central pulmonary vascularity is engorged. There is tortuosity of the descending thoracic aorta. The right internal jugular venous catheter tip projects over the distal third of the SVC.  IMPRESSION: Slightly increased prominence of the pulmonary interstitium may reflect mild edema. There is no pneumonia nor significant pleural effusion.   Electronically Signed   By: David  Martinique   On: 04/13/2014 07:40   Janora Norlander, DO 04/14/2014, 7:06 AM PGY-1, [place] Intern pager: [phone], text pages welcome

## 2014-04-14 NOTE — Care Management Note (Signed)
    Page 1 of 1   04/14/2014     2:44:03 PM CARE MANAGEMENT NOTE 04/14/2014  Patient:  Melvin Hudson, Melvin Hudson   Account Number:  [id number]  Date Initiated:  04/11/2014  Documentation initiated by:  Luz Lex  Subjective/Objective Assessment:   Admitted with GIB     Action/Plan:   Anticipated DC Date:  04/13/2014   Anticipated DC Plan:  [place]  CM consult      Choice offered to / List presented to:     DME arranged  [facility]  Vassie Moselle      DME agency  [address].        Status of service:  Completed, signed off Medicare Important Message given?  YES (If response is "NO", the following Medicare IM given date fields will be blank) Date Medicare IM given:  04/14/2014 Medicare IM given by:  Marvetta Gibbons Date Additional Medicare IM given:   Additional Medicare IM given by:    Discharge Disposition:  HOME/SELF CARE  Per UR Regulation:  Reviewed for med. necessity/level of care/duration of stay  If discussed at [place] of Stay Meetings, dates discussed:    Comments:  Contact:  [place] Friend   [place] Niece   [phone]  04/14/14- [address] RN, BSN [phone] Spoke with pt at bedside- per conversation pt is hopeful for d/c later today- pt wants RW and 3n1 for home- orders have been placed- spoke with Jermaine with [facility] regarding DME needs- RW and 3n1 to be delivered to room prior to discharge- no other recommendations made by PT- no other CM needs noted.  04-11-14 1:30pm Luz Lex, [phone] Transferred to ICU to stabilize bleed prior to IR procedure.

## 2014-04-14 NOTE — Procedures (Signed)
Pt placed on hospital cpap maching set to auto mode.  Min-5 max-20.  Pt resting comfortably at this time.

## 2014-04-14 NOTE — Progress Notes (Signed)
Pt transferred to 5w03 by NT via wheelchair. All belongings sent with patient including CPAP. Daughter at side. Report given to [place], [place].

## 2014-04-14 NOTE — Progress Notes (Signed)
Utilization review completed.  

## 2014-04-14 NOTE — Progress Notes (Signed)
Physical Therapy Treatment Patient Details Name: Melvin Hudson MRN: [id number] DOB: 1955-09-12 Today's Date: 04/14/2014    History of Present Illness Patient is a 59 yo male admitted 04/08/14 with GIB, symptomatic anemia (hypotension and weakness).  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  Feel pt will do well at home and not need any PT f/u at D/C.  Pt anticipates D/C over weekend, but if pt is still on acute on Monday, will f/u to ensure no questions/concerns.    Follow Up Recommendations  No PT follow up     Equipment Recommendations  Rolling walker with 5" wheels;3in1 (PT)    Recommendations for Other Services       Precautions / Restrictions Precautions Precautions: Fall Restrictions Weight Bearing Restrictions: No    Mobility  Bed Mobility               General bed mobility comments: pt sitting in recliner.    Transfers Overall transfer level: Modified independent Equipment used: None Transfers: Sit to/from Stand              Ambulation/Gait Ambulation/Gait assistance: Supervision Ambulation Distance (Feet): 600 Feet Assistive device:  (IV pole) Gait Pattern/deviations: Step-through pattern;Decreased stride length     General Gait Details: pt moving very well and uses IV pole as he normally uses a cane for ambulation.  pt demos good balance and safety.     Stairs            Wheelchair Mobility    Modified Rankin (Stroke Patients Only)       Balance Overall balance assessment: Needs assistance Sitting-balance support: No upper extremity supported;Feet supported Sitting balance-Leahy Scale: Good     Standing balance support: No upper extremity supported Standing balance-Leahy Scale: Good                      Cognition Arousal/Alertness: Awake/alert Behavior During Therapy: WFL  for tasks assessed/performed Overall Cognitive Status: Within Functional Limits for tasks assessed                      Exercises      General Comments        Pertinent Vitals/Pain Pain Assessment: No/denies pain    Home Living                      Prior Function            PT Goals (current goals can now be found in the care plan section) Acute Rehab PT Goals Patient Stated Goal: To get stronger PT Goal Formulation: With patient Time For Goal Achievement: 04/17/14 Potential to Achieve Goals: Good Progress towards PT goals: Progressing toward goals    Frequency  Min 3X/week    PT Plan Current plan remains appropriate    Co-evaluation             End of Session   Activity Tolerance: Patient tolerated treatment well Patient left: in chair;with call bell/phone within reach     Time: 1135-1154 PT Time Calculation (min) (ACUTE ONLY): 19 min  Charges:  $Gait Training: 8-22 mins                    G Codes:      Dannia Snook F,  PT [phone] 04/14/2014, 2:10 PM

## 2014-04-15 DIAGNOSIS — D62 Acute posthemorrhagic anemia: Secondary | ICD-10-CM | POA: Insufficient documentation

## 2014-04-15 LAB — BASIC METABOLIC PANEL
ANION GAP: 4 — AB (ref 5–15)
BUN: 7 mg/dL (ref 6–23)
CALCIUM: 7.9 mg/dL — AB (ref 8.4–10.5)
CO2: 27 mmol/L (ref 19–32)
Chloride: 106 mmol/L (ref 96–112)
Creatinine, Ser: 1.09 mg/dL (ref 0.50–1.35)
GFR calc Af Amer: 83 mL/min — ABNORMAL LOW (ref >90)
GFR calc non Af Amer: 72 mL/min — ABNORMAL LOW (ref >90)
GLUCOSE: 123 mg/dL — AB (ref 70–99)
POTASSIUM: 3.3 mmol/L — AB (ref 3.5–5.1)
Sodium: 137 mmol/L (ref 135–145)

## 2014-04-15 LAB — CBC WITH DIFFERENTIAL/PLATELET
Basophils Absolute: 0 10*3/uL (ref 0.0–0.1)
Basophils Relative: 0 % (ref 0–1)
Eosinophils Absolute: 0.4 10*3/uL (ref 0.0–0.7)
Eosinophils Relative: 8 % — ABNORMAL HIGH (ref 0–5)
HCT: 27.3 % — ABNORMAL LOW (ref 39.0–52.0)
HEMOGLOBIN: 9 g/dL — AB (ref 13.0–17.0)
LYMPHS PCT: 15 % (ref 12–46)
Lymphs Abs: 0.8 10*3/uL (ref 0.7–4.0)
MCH: 28 pg (ref 26.0–34.0)
MCHC: 33 g/dL (ref 30.0–36.0)
MCV: 85 fL (ref 78.0–100.0)
MONOS PCT: 7 % (ref 3–12)
Monocytes Absolute: 0.3 10*3/uL (ref 0.1–1.0)
Neutro Abs: 3.7 10*3/uL (ref 1.7–7.7)
Neutrophils Relative %: 70 % (ref 43–77)
Platelets: 193 10*3/uL (ref 150–400)
RBC: 3.21 MIL/uL — ABNORMAL LOW (ref 4.22–5.81)
RDW: 17.9 % — ABNORMAL HIGH (ref 11.5–15.5)
WBC: 5.2 10*3/uL (ref 4.0–10.5)

## 2014-04-15 LAB — CBC
HCT: 24 % — ABNORMAL LOW (ref 39.0–52.0)
HEMOGLOBIN: 8 g/dL — AB (ref 13.0–17.0)
MCH: 28.8 pg (ref 26.0–34.0)
MCHC: 33.3 g/dL (ref 30.0–36.0)
MCV: 86.3 fL (ref 78.0–100.0)
PLATELETS: 156 10*3/uL (ref 150–400)
RBC: 2.78 MIL/uL — ABNORMAL LOW (ref 4.22–5.81)
RDW: 17.8 % — ABNORMAL HIGH (ref 11.5–15.5)
WBC: 4.8 10*3/uL (ref 4.0–10.5)

## 2014-04-15 NOTE — Progress Notes (Signed)
Melvin Hudson to be D/C'd Home per MD order.  Discussed with the patient and all questions fully answered.    Medication List    STOP taking these medications        aspirin EC 81 MG tablet      TAKE these medications        diphenhydrAMINE 25 MG tablet  Commonly known as:  BENADRYL  Take 1 tablet (25 mg total) by mouth every 6 (six) hours.     magic mouthwash w/lidocaine Soln  1 part 2% viscous lidocaine, 1 part water. Swish, gargle, and/or swallow 49mL QID, prn sore mouth/throat. Use 53min before meals & bedtime.     olmesartan 40 MG tablet  Commonly known as:  BENICAR  Take 40 mg by mouth daily.     oxyCODONE-acetaminophen 10-325 MG per tablet  Commonly known as:  PERCOCET  Take 1 tablet by mouth every 6 (six) hours as needed for pain.     pentoxifylline 400 MG CR tablet  Commonly known as:  TRENTAL  Take 1 tab daily x 1 week, then take 1 tab BID thereafter     polyethylene glycol packet  Commonly known as:  MIRALAX / GLYCOLAX  Take 17 g by mouth daily as needed (for constipation).     spironolactone 25 MG tablet  Commonly known as:  ALDACTONE  Take 25 mg by mouth every morning.     tadalafil 20 MG tablet  Commonly known as:  CIALIS  Take 20 mg by mouth daily as needed for erectile dysfunction.     VITAMIN D-3 PO  Take 1,000 Units by mouth daily.     vitamin E 400 UNIT capsule  Commonly known as:  vitamin E  Take 1 cap daily x 1 week, then take 1 cap BID thereafter        VVS, Skin clean, dry and intact without evidence of skin break down, no evidence of skin tears noted. IV catheter discontinued intact. Site without signs and symptoms of complications. Dressing and pressure applied.  An After Visit Summary was printed and given to the patient.  D/c education completed with patient/family including follow up instructions, medication list, d/c activities limitations if indicated, with other d/c instructions as indicated by MD - patient able to verbalize  understanding, all questions fully answered.   Patient instructed to return to ED, call 911, or call MD for any changes in condition.   Patient escorted via [place], and D/C home via private auto.  Audria Nine F 04/15/2014 6:41 PM

## 2014-04-15 NOTE — Progress Notes (Signed)
Rolling walker given to pt.

## 2014-04-15 NOTE — Progress Notes (Signed)
Family Medicine Teaching Service Daily Progress Note Intern Pager: [phone]  Patient name: Melvin Hudson Medical record number: [id number] Date of birth: [birth year] Age: 59 y.o. Gender: male  Primary Care Provider: Kristine Garbe, MD Consultants: GI Code Status: FULL  Pt Overview and Major Events to Date:  2/20: Transfused 2u pRBCs 2/23: Transfused 2 more units pRBCs, colonoscopy >>ICU 2/25: Out of ICU> SDU: FPTS  Assessment and Plan: Melvin Hudson is a 59 y.o. male presenting with GI bleed x2 days and fatigue x1 day . PMH is significant for Malignant neoplasm of tonsil, HTN, OSA, tobacco abuse  Hematochezia/ GI Bleed: S/p (total) 9 units of pRBCs, 2 units FFPs.  Colonoscopy: suspect diverticular bleed but could not advance passed descending colon.  Mesenteric Angiogram inconclusive.  Tagged RBCs negative for localization. Hgb 8.8>8.1> 9.0>8.0 this am.  -Telemetry -Vitals per floor protocol -c/s GI: following, appreciate recs: if rebleeding occurs repeat tagged RBC study. -Will need follow up with GI.  Consider repeat colonoscopy. -Protonix BID -Tylenol PRN, Zofran PRN  -Threshold for transfusion <8.0. -Repeat orthostatic vital signs -Repeat CBC @1230pm  today.  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 Sees Dr Isidore Moos for this. CT neck/chest scheduled for April 2016. Biopsy in 12/2013 negative for dysplasia or malignancy.  -Hold home Trental in setting of GI bleed. -Biotene, Vitamin E, MMW  HTN: hypotensive on admission.  On aldactone, benicar at home.  -Continue home aldactone  OSA: CPAP at home -CPAP QHS   Tobacco abuse: not currently a smoker -No nicotine patches needed  Chronic pain of LE: On Percocet 10 at home -Continue Percocet 10 PRN   FEN/GI: KVO, Heart healthy diet, PPI BID Prophylaxis: SCDs in setting of GI bleed  Disposition: Discharge home pending stable hemoglobin and continued  resolution of symptoms.  Hopefully tomorrow.    Subjective:  Patient states he feels great this morning. He has been up walking around and just finished breakfast. He wants to go home. He denies any bloody stool and tolerating PO well.   Objective: Temp:  [97.9 F (36.6 C)-98.9 F (37.2 C)] 98 F (36.7 C) (02/27 0523) Pulse Rate:  [69-84] 69 (02/27 0523) Resp:  [16-18] 18 (02/27 0523) BP: (116-146)/(81-94) 144/88 mmHg (02/27 0523) SpO2:  [95 %-100 %] 100 % (02/27 0523) Physical Exam: General: awake, alert, sitting up at bedside having breakfast, NAD HENT: [place]/AT, EOMI, pale conjunctiva, o/p poor dentition. Cardiovascular: RRR, no m/r/g Respiratory: CTAB, no increased WOB Abdomen: obese, soft, NT/ND, +BS Extremities: WWP, no edema Neuro: AOx3, speech normal, no focal deficits, follows commands  Laboratory:  Recent Labs Lab 04/14/14 0853 04/14/14 1500 04/15/14 0501  WBC 6.2 6.1 4.8  HGB 9.0* 9.0* 8.0*  HCT 26.7* 26.8* 24.0*  PLT 168 176 156    Recent Labs Lab 04/08/14 1927  04/13/14 0930 04/14/14 0206 04/15/14 0501  NA 138  < > 139 138 137  K 3.7  < > 3.1* 3.4* 3.3*  CL 106  < > 106 104 106  CO2 24  < > 30 28 27   BUN 13  < > <5* 5* 7  CREATININE 1.17  < > 0.97 1.07 1.09  CALCIUM 8.4  < > 8.3* 8.1* 7.9*  PROT 6.0  --   --   --   --   BILITOT 0.7  --   --   --   --   ALKPHOS 111  --   --   --   --  ALT 10  --   --   --   --   AST 17  --   --   --   --   GLUCOSE 164*  < > 110* 99 123*  < > = values in this interval not displayed.  Imaging/Diagnostic Tests: No results found. Ma Hillock, DO 04/15/2014, 9:05 AM PGY-3, [place] Intern pager: [phone], text pages welcome

## 2014-04-17 DIAGNOSIS — M549 Dorsalgia, unspecified: Secondary | ICD-10-CM | POA: Diagnosis present

## 2014-04-17 DIAGNOSIS — K5731 Diverticulosis of large intestine without perforation or abscess with bleeding: Secondary | ICD-10-CM | POA: Diagnosis not present

## 2014-04-17 DIAGNOSIS — E669 Obesity, unspecified: Secondary | ICD-10-CM | POA: Diagnosis present

## 2014-04-17 DIAGNOSIS — E876 Hypokalemia: Secondary | ICD-10-CM | POA: Diagnosis present

## 2014-04-17 DIAGNOSIS — I10 Essential (primary) hypertension: Secondary | ICD-10-CM | POA: Diagnosis present

## 2014-04-17 DIAGNOSIS — G8929 Other chronic pain: Secondary | ICD-10-CM | POA: Diagnosis present

## 2014-04-17 DIAGNOSIS — K922 Gastrointestinal hemorrhage, unspecified: Secondary | ICD-10-CM | POA: Diagnosis not present

## 2014-04-17 DIAGNOSIS — Z85818 Personal history of malignant neoplasm of other sites of lip, oral cavity, and pharynx: Secondary | ICD-10-CM

## 2014-04-17 DIAGNOSIS — R000 Tachycardia, unspecified: Secondary | ICD-10-CM | POA: Diagnosis present

## 2014-04-17 DIAGNOSIS — Z87891 Personal history of nicotine dependence: Secondary | ICD-10-CM

## 2014-04-17 DIAGNOSIS — Z6841 Body Mass Index (BMI) 40.0 and over, adult: Secondary | ICD-10-CM

## 2014-04-17 DIAGNOSIS — G4733 Obstructive sleep apnea (adult) (pediatric): Secondary | ICD-10-CM | POA: Diagnosis present

## 2014-04-17 DIAGNOSIS — Z923 Personal history of irradiation: Secondary | ICD-10-CM

## 2014-04-17 DIAGNOSIS — K648 Other hemorrhoids: Secondary | ICD-10-CM | POA: Diagnosis present

## 2014-04-18 ENCOUNTER — Inpatient Hospital Stay (HOSPITAL_COMMUNITY)
Admission: EM | Admit: 2014-04-18 | Discharge: 2014-04-20 | DRG: 378 | Disposition: A | Discharge status: Home / Self Care | Payer: Medicare Other | Attending: Family Medicine | Admitting: Family Medicine

## 2014-04-18 ENCOUNTER — Encounter (HOSPITAL_COMMUNITY): Payer: Self-pay | Admitting: *Deleted

## 2014-04-18 DIAGNOSIS — G4733 Obstructive sleep apnea (adult) (pediatric): Secondary | ICD-10-CM | POA: Diagnosis present

## 2014-04-18 DIAGNOSIS — K922 Gastrointestinal hemorrhage, unspecified: Secondary | ICD-10-CM

## 2014-04-18 DIAGNOSIS — K648 Other hemorrhoids: Secondary | ICD-10-CM | POA: Diagnosis present

## 2014-04-18 DIAGNOSIS — Z6841 Body Mass Index (BMI) 40.0 and over, adult: Secondary | ICD-10-CM | POA: Diagnosis not present

## 2014-04-18 DIAGNOSIS — E876 Hypokalemia: Secondary | ICD-10-CM | POA: Diagnosis present

## 2014-04-18 DIAGNOSIS — G8929 Other chronic pain: Secondary | ICD-10-CM | POA: Diagnosis present

## 2014-04-18 DIAGNOSIS — I10 Essential (primary) hypertension: Secondary | ICD-10-CM | POA: Diagnosis not present

## 2014-04-18 DIAGNOSIS — K5731 Diverticulosis of large intestine without perforation or abscess with bleeding: Secondary | ICD-10-CM | POA: Diagnosis not present

## 2014-04-18 DIAGNOSIS — R000 Tachycardia, unspecified: Secondary | ICD-10-CM | POA: Diagnosis present

## 2014-04-18 DIAGNOSIS — E669 Obesity, unspecified: Secondary | ICD-10-CM | POA: Diagnosis not present

## 2014-04-18 DIAGNOSIS — Z8719 Personal history of other diseases of the digestive system: Secondary | ICD-10-CM

## 2014-04-18 DIAGNOSIS — D62 Acute posthemorrhagic anemia: Secondary | ICD-10-CM | POA: Diagnosis not present

## 2014-04-18 DIAGNOSIS — G894 Chronic pain syndrome: Secondary | ICD-10-CM | POA: Insufficient documentation

## 2014-04-18 DIAGNOSIS — Z87891 Personal history of nicotine dependence: Secondary | ICD-10-CM | POA: Diagnosis not present

## 2014-04-18 DIAGNOSIS — Z923 Personal history of irradiation: Secondary | ICD-10-CM | POA: Diagnosis not present

## 2014-04-18 DIAGNOSIS — Z85818 Personal history of malignant neoplasm of other sites of lip, oral cavity, and pharynx: Secondary | ICD-10-CM | POA: Diagnosis not present

## 2014-04-18 DIAGNOSIS — M549 Dorsalgia, unspecified: Secondary | ICD-10-CM | POA: Diagnosis present

## 2014-04-18 LAB — CBC
HCT: 27.3 % — ABNORMAL LOW (ref 39.0–52.0)
HCT: 20.4 % — ABNORMAL LOW (ref 39.0–52.0)
HCT: 21.4 % — ABNORMAL LOW (ref 39.0–52.0)
HEMOGLOBIN: 6.9 g/dL — AB (ref 13.0–17.0)
Hemoglobin: 8.8 g/dL — ABNORMAL LOW (ref 13.0–17.0)
Hemoglobin: 6.7 g/dL — CL (ref 13.0–17.0)
MCH: 28.3 pg (ref 26.0–34.0)
MCH: 28.4 pg (ref 26.0–34.0)
MCH: 28.8 pg (ref 26.0–34.0)
MCHC: 32.2 g/dL (ref 30.0–36.0)
MCHC: 32.8 g/dL (ref 30.0–36.0)
MCHC: 32.2 g/dL (ref 30.0–36.0)
MCV: 86.1 fL (ref 78.0–100.0)
MCV: 88.1 fL (ref 78.0–100.0)
MCV: 89.2 fL (ref 78.0–100.0)
PLATELETS: 191 10*3/uL (ref 150–400)
PLATELETS: 276 10*3/uL (ref 150–400)
Platelets: 216 K/uL (ref 150–400)
RBC: 3.06 MIL/uL — ABNORMAL LOW (ref 4.22–5.81)
RBC: 2.43 MIL/uL — AB (ref 4.22–5.81)
RBC: 2.37 MIL/uL — ABNORMAL LOW (ref 4.22–5.81)
RDW: 17.6 % — ABNORMAL HIGH (ref 11.5–15.5)
RDW: 18.1 % — AB (ref 11.5–15.5)
RDW: 18.2 % — ABNORMAL HIGH (ref 11.5–15.5)
WBC: 6.5 10*3/uL (ref 4.0–10.5)
WBC: 5.9 10*3/uL (ref 4.0–10.5)
WBC: 5.4 K/uL (ref 4.0–10.5)

## 2014-04-18 LAB — PROTIME-INR
INR: 1.25 (ref 0.00–1.49)
Prothrombin Time: 15.8 seconds — ABNORMAL HIGH (ref 11.6–15.2)

## 2014-04-18 LAB — COMPREHENSIVE METABOLIC PANEL
ALK PHOS: 97 U/L (ref 39–117)
ALT: 11 U/L (ref 0–53)
AST: 22 U/L (ref 0–37)
Albumin: 3.6 g/dL (ref 3.5–5.2)
Anion gap: 2 — ABNORMAL LOW (ref 5–15)
BUN: 9 mg/dL (ref 6–23)
CALCIUM: 8.2 mg/dL — AB (ref 8.4–10.5)
CO2: 28 mmol/L (ref 19–32)
Chloride: 110 mmol/L (ref 96–112)
Creatinine, Ser: 1.05 mg/dL (ref 0.50–1.35)
GFR calc Af Amer: 87 mL/min — ABNORMAL LOW (ref >90)
GFR calc non Af Amer: 75 mL/min — ABNORMAL LOW (ref >90)
Glucose, Bld: 115 mg/dL — ABNORMAL HIGH (ref 70–99)
POTASSIUM: 3.4 mmol/L — AB (ref 3.5–5.1)
SODIUM: 140 mmol/L (ref 135–145)
TOTAL PROTEIN: 5.9 g/dL — AB (ref 6.0–8.3)
Total Bilirubin: 0.4 mg/dL (ref 0.3–1.2)

## 2014-04-18 LAB — CBC WITH DIFFERENTIAL/PLATELET
BASOS ABS: 0 10*3/uL (ref 0.0–0.1)
Basophils Relative: 0 % (ref 0–1)
Eosinophils Absolute: 0.1 10*3/uL (ref 0.0–0.7)
Eosinophils Relative: 2 % (ref 0–5)
HCT: 22.8 % — ABNORMAL LOW (ref 39.0–52.0)
Hemoglobin: 7.4 g/dL — ABNORMAL LOW (ref 13.0–17.0)
LYMPHS ABS: 0.5 10*3/uL — AB (ref 0.7–4.0)
LYMPHS PCT: 8 % — AB (ref 12–46)
MCH: 28.9 pg (ref 26.0–34.0)
MCHC: 32.5 g/dL (ref 30.0–36.0)
MCV: 89.1 fL (ref 78.0–100.0)
Monocytes Absolute: 0.3 10*3/uL (ref 0.1–1.0)
Monocytes Relative: 5 % (ref 3–12)
NEUTROS PCT: 85 % — AB (ref 43–77)
Neutro Abs: 5.4 10*3/uL (ref 1.7–7.7)
PLATELETS: 257 10*3/uL (ref 150–400)
RBC: 2.56 MIL/uL — ABNORMAL LOW (ref 4.22–5.81)
RDW: 18.1 % — ABNORMAL HIGH (ref 11.5–15.5)
WBC: 6.4 10*3/uL (ref 4.0–10.5)

## 2014-04-18 LAB — TYPE AND SCREEN
ABO/RH(D): O POS
ANTIBODY SCREEN: NEGATIVE

## 2014-04-18 LAB — ABO/RH: ABO/RH(D): O POS

## 2014-04-18 LAB — PREPARE RBC (CROSSMATCH)

## 2014-04-18 LAB — POC OCCULT BLOOD, ED: FECAL OCCULT BLD: POSITIVE — AB

## 2014-04-18 MED ORDER — POTASSIUM CHLORIDE 10 MEQ/100ML IV SOLN
10.0000 meq | INTRAVENOUS | Status: DC
  Administered 2014-04-18 (×2): 10 meq via INTRAVENOUS
  Filled 2014-04-18 (×4): qty 100

## 2014-04-18 MED ORDER — SODIUM CHLORIDE 0.9 % IV SOLN
INTRAVENOUS | Status: DC
  Administered 2014-04-18 – 2014-04-19 (×2): via INTRAVENOUS

## 2014-04-18 MED ORDER — MORPHINE SULFATE 2 MG/ML IJ SOLN
2.0000 mg | INTRAMUSCULAR | Status: DC | PRN
  Administered 2014-04-18 – 2014-04-20 (×2): 2 mg via INTRAVENOUS
  Filled 2014-04-18 (×2): qty 1

## 2014-04-18 MED ORDER — SODIUM CHLORIDE 0.9 % IV SOLN: Freq: Once | INTRAVENOUS | Status: DC

## 2014-04-18 MED ORDER — SODIUM CHLORIDE 0.9 % IV BOLUS (SEPSIS)
1000.0000 mL | Freq: Once | INTRAVENOUS | Status: AC
  Administered 2014-04-18: 1000 mL via INTRAVENOUS

## 2014-04-18 MED ORDER — PANTOPRAZOLE SODIUM 40 MG IV SOLR
40.0000 mg | Freq: Two times a day (BID) | INTRAVENOUS | Status: DC
  Administered 2014-04-18 – 2014-04-19 (×4): 40 mg via INTRAVENOUS
  Filled 2014-04-18 (×8): qty 40

## 2014-04-18 MED ORDER — ONDANSETRON HCL 4 MG/2ML IJ SOLN: 4.0000 mg | Freq: Four times a day (QID) | INTRAMUSCULAR | Status: DC | PRN

## 2014-04-18 MED ORDER — POTASSIUM CHLORIDE CRYS ER 20 MEQ PO TBCR
10.0000 meq | EXTENDED_RELEASE_TABLET | Freq: Three times a day (TID) | ORAL | Status: AC
  Administered 2014-04-18 – 2014-04-19 (×3): 10 meq via ORAL
  Filled 2014-04-18 (×5): qty 1

## 2014-04-18 MED ORDER — ONDANSETRON HCL 4 MG PO TABS: 4.0000 mg | ORAL_TABLET | Freq: Four times a day (QID) | ORAL | Status: DC | PRN

## 2014-04-18 MED ORDER — SODIUM CHLORIDE 0.9 % IJ SOLN
3.0000 mL | Freq: Two times a day (BID) | INTRAMUSCULAR | Status: DC
  Administered 2014-04-20: 3 mL via INTRAVENOUS

## 2014-04-18 NOTE — H&P (Signed)
[facility] Admission History and Physical Service Pager: [phone]  Patient name: Melvin Hudson Medical record number: [id number] Date of birth: 22-Sep-1955 Age: 59 y.o. Gender: male  Primary Care Provider: Kristine Garbe, MD Consultants: Gastroenterology Code Status: Full Code  Chief Complaint: Bright red blood per rectum  Assessment and Plan: Melvin Hudson is a 59 y.o. male presenting with GI bleeding . PMH is significant for diverticulosis, HTN, right tonsil carcinoma  Hematochezia with history of probable diverticular bleed: patient with previous admission requiring subsequent ICU admission for unstableness. No clear source of bleeding identified at that time. Received multiple units of PRBCs. Current presentation is similar. Most likely lower GI but brisk upper GI bleed in differential. Patient is mildly symptomatic and still having intermittent bloody stools. - Admitted to med-surg. Attending Dr. 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 Sees Dr Isidore Moos for this. CT neck/chest scheduled for April 2016. Biopsy in 12/2013 negative for dysplasia or malignancy.  -Hold home Trental in setting of GI bleed -Hold vitamins while NPO  HTN: normotensive. On benicar and spironolactone at home - Hold BP meds while NPO. Restart when diet advanced  OSA: CPAP at home - CPAP QHS  Chronic pain: takes Percocet 10-325mg  at home - morphine while NPO; restart home medications when diet is advanced  FEN/GI: NS @100ml /hr Prophylaxis: Protonix, SCDs  Disposition: Admitted to floor, attending Dr. Ree Kida  History of Present Illness: Melvin Hudson is a 59 y.o. male presenting with 3-4  episodes of bright red blood per rectum. He was recently admitted, requiring subsequent ICU admission, for a GI bleed. Since discharge, he had been having regular bowel movements until last night.  His bowel movements are non-painful and generally consist of little stool and mostly blood. He went to the [facility] ED for evaluation. He reports having some lightheadedness. No chest pain, shortness of breath, nausea, vomiting, fever or abdominal pain. Non-painful. Very little stool. Some lightheadedness. No chest pain or shortness of breath. No abdominal . No nausea or vomiting. No fever.  In the ED, patient reports having had one bloody bowel movement. He was given a liter bolus of NS. Initial hemoglobin was 8.8 and dropped to 7.4 on repeat. FOBT was positive. Patient then transferred to [facility] for inpatient management.   Review Of Systems: Per HPI with the following additions: None Otherwise 12 point review of systems was performed and was unremarkable.  Patient Active Problem List   Diagnosis Date Noted  . History of GI diverticular bleed 04/18/2014  . Acute blood loss anemia   . Lower GI bleed   . Gastrointestinal bleeding, lower   . Encounter for central line placement   . Hematochezia 04/10/2014  . Symptomatic anemia 04/08/2014  . GI bleed 04/08/2014  . 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 Mouth pain 07/18/2013  . Weight loss 07/18/2013  . Post-operative state 07/08/2013  . Post-op pain 07/08/2013  . Malignant neoplasm of tonsil 06/29/2013  . OSA (obstructive sleep apnea) 11/19/2011  . Angio-edema 09/17/2011  . hypotension  09/17/2011  . HTN (hypertension) 09/17/2011  . Gout 09/17/2011  . Tobacco abuse 09/17/2011   Past Medical History: Past Medical History  Diagnosis Date  . Hypertension   .  Angioedema 09/17/11    tongue and lips  . Obesity   . Hx of gout   . S/P radiation therapy 07/26/2013-09/15/2013    Right tonsil/bilateral neck/ 7000 cGy  . Squamous cell carcinoma of right tonsil  06/14/13  . Arthritis     "both of my legs and feet" (01/05/2014)  . OSA on CPAP    Past Surgical History: Past Surgical History  Procedure Laterality Date  . Knee arthroscopy Right ~ 1994  . Multiple tooth extractions  ~ 2008  . Multiple extractions with alveoloplasty N/A 07/08/2013    Procedure: Extraction of tooth #'s 22, 27 with alveoloplasty and bilateal mandibular facial exostoses reductions;  Surgeon: Lenn Cal, DDS;  Location: [facility];  Service: Oral Surgery;  Laterality: N/A;  . Colonoscopy w/ biopsies and polypectomy      benign  . Direct laryngoscopy  01/05/2014  . Direct laryngoscopy N/A 01/05/2014    Procedure: DIRECT LARYNGOSCOPY AND BIOPSY;  Surgeon: [name], MD;  Location: [place];  Service: ENT;  Laterality: N/A;  . Left and right heart catheterization with coronary/graft angiogram N/A 12/31/2010    Procedure: LEFT AND RIGHT HEART CATHETERIZATION WITH Beatrix Fetters;  Surgeon: Laverda Page, MD;  Location: [facility] CATH LAB;  Service: Cardiovascular;  Laterality: N/A;  . Colonoscopy Left 04/11/2014    Procedure: COLONOSCOPY;  Surgeon: Arta Silence, MD;  Location: [facility] ENDOSCOPY;  Service: Endoscopy;  Laterality: Left;   Social History: History  Substance Use Topics  . Smoking status: Former Smoker -- 42 years    Types: Cigarettes, Cigars    Quit date: 06/17/2013  . Smokeless tobacco: Never Used     Comment: "Quit smoking cigarettes in ~  2007"  . 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  Family History: Family History  Problem Relation Age of Onset  . Cancer Sister     breast ca   Allergies and Medications: Allergies  Allergen Reactions  . Advil [Ibuprofen]     Feels "jittery"  . Azilsartan Swelling    Avoid ARB and ACEI per MD   No current facility-administered medications on file prior to encounter.   Current Outpatient Prescriptions  on File Prior to Encounter  Medication Sig Dispense Refill  . Cholecalciferol (VITAMIN D-3 PO) Take 1,000 Units by mouth daily.    [name] olmesartan (BENICAR) 40 MG tablet Take 40 mg by mouth daily.    [name] spironolactone (ALDACTONE) 25 MG tablet Take 25 mg by mouth every morning.     . Alum & Mag Hydroxide-Simeth (MAGIC MOUTHWASH W/LIDOCAINE) SOLN 1 part 2% viscous lidocaine, 1 part water. Swish, gargle, and/or swallow 38mL QID, prn sore mouth/throat. Use 93min before meals & bedtime. (Patient not taking: Reported on 04/08/2014) 480 mL 5  . diphenhydrAMINE (BENADRYL) 25 MG tablet Take 1 tablet (25 mg total) by mouth every 6 (six) hours. (Patient not taking: Reported on 04/08/2014) 20 tablet 0  . oxyCODONE-acetaminophen (PERCOCET) 10-325 MG per tablet Take 1 tablet by mouth every 6 (six) hours as needed for pain. (Patient not taking: Reported on 04/18/2014) 120 tablet 0  . pentoxifylline (TRENTAL) 400 MG CR tablet Take 1 tab daily x 1 week, then take 1 tab BID thereafter (Patient taking differently: Take 400 mg by mouth. ) 60 tablet 4  . polyethylene glycol (MIRALAX / GLYCOLAX) packet Take 17 g by mouth daily as needed (for constipation).     [name]  tadalafil (CIALIS) 20 MG tablet Take 20 mg by mouth daily as needed for erectile dysfunction.     . vitamin E (VITAMIN E) 400 UNIT capsule Take 1 cap daily x 1 week, then take 1 cap BID thereafter (Patient taking differently: Take 400 Units by mouth daily. ) 60 capsule 4    Objective: BP 111/71 mmHg  Pulse 98  Temp(Src) 97.5 F (36.4 C) (Oral)  Resp 18  Ht 6\' 1"  (1.854 m)  Wt 305 lb (138.347 kg)  BMI 40.25 kg/m2  SpO2 100% Exam: General: Well appearing, no distress HEENT: PERRL, slightly dry mucous membranes Cardiovascular: Regular rate and rhythm, no murmurs appreciated Respiratory: Clear to auscultation bilaterally, no wheezing or rhonchi Abdomen: Soft, non-tender, obese, non-distended GU: anus without any lesions, rectum without obvious masses, good tone  and no tenderness. Some faint blood streaks noted on withdrawal Extremities: Trace edema Skin: No rashes Neuro: Alert, oriented, CN intact, no focal findings  Labs and Imaging: CBC BMET   Recent Labs Lab 04/18/14 0347  WBC 6.4  HGB 7.4*  HCT 22.8*  PLT 257    Recent Labs Lab 04/18/14 0059  NA 140  K 3.4*  CL 110  CO2 28  BUN 9  CREATININE 1.05  GLUCOSE 115*  CALCIUM 8.2*     No results found.  Cordelia Poche, MD 04/18/2014, 8:23 AM PGY-2, [place] [facility] pager: [phone], text pages welcome

## 2014-04-18 NOTE — Consult Note (Signed)
Referring Provider: Dr. Lindell Noe Primary Care Physician:  Kristine Garbe, MD Primary Gastroenterologist:  Althia Forts  Reason for Consultation:  GI bleed  HPI: Melvin Hudson is a 59 y.o. male who was recently discharged after having a GI bleed that was thought to be diverticular in origin. Colonoscopy on 04/11/14 showed red blood in the sigmoid colon with sigmoid diverticulosis and colonoscope not advanced proximal to the distal descending colon due to active bleeding. Subsequent angiogram was done that was negative. Bleeding scan the following day was negative. Hgb 9.0 prior to d/c 04/15/14 and Hgb 8.8 on readmit yesterday. Hgb 6.7 today. Reports bright red blood per rectum began again yesterday and had 3 large bloody stools that day. Felt dizzy as well. Denies N/V/abdominal pain. Reports that amount of bleeding has significantly decreased today. Denies NSAIDs.  Past Medical History  Diagnosis Date  . Hypertension   . Angioedema 09/17/11    tongue and lips  . Obesity   . Hx of gout   . S/P radiation therapy 07/26/2013-09/15/2013    Right tonsil/bilateral neck/ 7000 cGy  . 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 Arthritis     "both of my legs and feet" (01/05/2014)  . OSA on CPAP     Past Surgical History  Procedure Laterality Date  . Knee arthroscopy Right ~ 1994  . Multiple tooth extractions  ~ 2008  . Multiple extractions with alveoloplasty N/A 07/08/2013    Procedure: Extraction of tooth #'s 22, 27 with alveoloplasty and bilateal mandibular facial exostoses reductions;  Surgeon: Lenn Cal, DDS;  Location: [facility];  Service: Oral Surgery;  Laterality: N/A;  . Colonoscopy w/ biopsies and polypectomy      benign  . Direct laryngoscopy  01/05/2014  . Direct laryngoscopy N/A 01/05/2014    Procedure: DIRECT LARYNGOSCOPY AND BIOPSY;  Surgeon: [name], MD;  Location: [place];  Service: ENT;  Laterality: N/A;  . Left and right heart catheterization with coronary/graft  angiogram N/A 12/31/2010    Procedure: LEFT AND RIGHT HEART CATHETERIZATION WITH Beatrix Fetters;  Surgeon: Laverda Page, MD;  Location: [facility] CATH LAB;  Service: Cardiovascular;  Laterality: N/A;  . Colonoscopy Left 04/11/2014    Procedure: COLONOSCOPY;  Surgeon: Arta Silence, MD;  Location: [facility] ENDOSCOPY;  Service: Endoscopy;  Laterality: Left;    Prior to Admission medications   Medication Sig Start Date End Date Taking? Authorizing Provider  Cholecalciferol (VITAMIN D-3 PO) Take 1,000 Units by mouth daily.   Yes Historical Provider, MD  olmesartan (BENICAR) 40 MG tablet Take 40 mg by mouth daily. 09/19/13  Yes Kristine Garbe, MD  spironolactone (ALDACTONE) 25 MG tablet Take 25 mg by mouth every morning.  09/18/11  Yes Debbe Odea, MD  Alum & Mag Hydroxide-Simeth (MAGIC MOUTHWASH W/LIDOCAINE) SOLN 1 part 2% viscous lidocaine, 1 part water. Swish, gargle, and/or swallow 66mL QID, prn sore mouth/throat. Use 14min before meals & bedtime. Patient not taking: Reported on 04/08/2014 08/08/13   Eppie Gibson, MD  diphenhydrAMINE (BENADRYL) 25 MG tablet Take 1 tablet (25 mg total) by mouth every 6 (six) hours. Patient not taking: Reported on 04/08/2014 10/29/13   Dorie Rank, MD  oxyCODONE-acetaminophen (PERCOCET) 10-325 MG per tablet Take 1 tablet by mouth every 6 (six) hours as needed for pain. Patient not taking: Reported on 04/18/2014 11/11/13   Eppie Gibson, MD  pentoxifylline (TRENTAL) 400 MG CR tablet Take 1 tab daily x 1 week, then take 1 tab BID thereafter Patient taking differently: Take  400 mg by mouth.  01/22/14   Eppie Gibson, MD  polyethylene glycol [facility] / Floria Raveling) packet Take 17 g by mouth daily as needed (for constipation).     Historical Provider, MD  tadalafil (CIALIS) 20 MG tablet Take 20 mg by mouth daily as needed for erectile dysfunction.     Historical Provider, MD  vitamin E (VITAMIN E) 400 UNIT capsule Take 1 cap daily x 1 week, then take 1 cap BID thereafter Patient taking  differently: Take 400 Units by mouth daily.  01/22/14   Eppie Gibson, MD    Scheduled Meds: . sodium chloride   Intravenous Once  . pantoprazole (PROTONIX) IV  40 mg Intravenous Q12H  . potassium chloride  10 mEq Intravenous Q1 Hr x 4  . sodium chloride  3 mL Intravenous Q12H   Continuous Infusions: . sodium chloride     PRN Meds:.morphine injection, ondansetron **OR** ondansetron (ZOFRAN) IV  Allergies as of 04/17/2014 - Review Complete 04/11/2014  Allergen Reaction Noted  . Advil [ibuprofen]  09/17/2011  . Azilsartan Swelling 09/18/2011    Family History  Problem Relation Age of Onset  . Cancer Sister     breast ca    History   Social History  . Marital Status: Legally Separated    Spouse Name: N/A  . Number of Children: N/A  . Years of Education: N/A   Occupational History  . Not on file.   Social History Main Topics  . Smoking status: Former Smoker -- 42 years    Types: Cigarettes, Cigars    Quit date: 06/17/2013  . Smokeless tobacco: Never Used     Comment: "Quit smoking cigarettes in ~  2007"  . 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 Drug Use: No  . Sexual Activity: Not Currently   Other Topics Concern  . Not on file   Social History Narrative    Review of Systems: All negative except as stated above in HPI.  Physical Exam: Vital signs: Filed Vitals:   04/18/14 0622  BP: 111/71  Pulse: 98  Temp: 97.5 F (36.4 C)  Resp: 18     General:   Obese, pleasant and cooperative in NAD HEENT: anicteric Lungs:  Clear throughout to auscultation.   No wheezes, crackles, or rhonchi. No acute distress. Heart:  Regular rate and rhythm; no murmurs, clicks, rubs,  or gallops. Abdomen: minimal epigastric tenderness without guarding, soft, nondistended, +BS  Rectal:  Deferred Ext: 1+ LE edema Neuro: alert, oriented  GI:  Lab Results:  Recent Labs  04/15/14 1610 04/18/14 0059 04/18/14 0347  WBC 5.2 6.5 6.4  HGB 9.0* 8.8* 7.4*   HCT 27.3* 27.3* 22.8*  PLT 193 276 257   BMET  Recent Labs  04/18/14 0059  NA 140  K 3.4*  CL 110  CO2 28  GLUCOSE 115*  BUN 9  CREATININE 1.05  CALCIUM 8.2*   LFT  Recent Labs  04/18/14 0059  PROT 5.9*  ALBUMIN 3.6  AST 22  ALT 11  ALKPHOS 97  BILITOT 0.4   PT/INR  Recent Labs  04/18/14 1043  LABPROT 15.8*  INR 1.25     Studies/Results: No results found.  Impression/Plan: GI bleed likely diverticular source. Bleeding does not seems to be resolving based on his history and doubt that a bleeding scan or angiogram would be helpful at this time. May need an updated colonoscopy to reevaluate the left colon and look at the right  colon that was not evaluated last week but would hold off on at this time. Supportive care. Clear liquid diet. If rebleeding occurs, then do RBC bleeding scan and if positive repeat angiogram. Will follow.    LOS: 0 days   [place] C.  04/18/2014, 11:41 AM

## 2014-04-18 NOTE — ED Notes (Signed)
Pt in the rest room at this time 

## 2014-04-18 NOTE — ED Provider Notes (Signed)
CSN: [id number]     Arrival date & time 04/17/14  2350 History   First MD Initiated Contact with Patient 04/18/14 0214     Chief Complaint  Patient presents with  . Rectal Bleeding     (Consider location/radiation/quality/duration/timing/severity/associated sxs/prior Treatment) HPI Melvin Hudson is a 59 y.o. male with past medical history of diverticular GI bleed presenting with GI bleeding. Patient was recently admitted to [facility] under the family medicine service for GI bleeding and symptomatic anemia. He was transfused multiple units of blood. Yesterday he states that he had bright red blood per rectum again and he wanted to come to emergency department early this time for evaluation. Patient states he has been weak, he denies any shortness of breath or lightheadedness. He denies any abdominal pain. He has no urinary symptoms. He's had no fevers or recent infections. Patient has no further complaints.  10 Systems reviewed and are negative for acute change except as noted in the HPI.     Past Medical History  Diagnosis Date  . Hypertension   . Angioedema 09/17/11    tongue and lips  . Obesity   . Hx of gout   . S/P radiation therapy 07/26/2013-09/15/2013    Right tonsil/bilateral neck/ 7000 cGy  . 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 Arthritis     "both of my legs and feet" (01/05/2014)  . OSA on CPAP    Past Surgical History  Procedure Laterality Date  . Knee arthroscopy Right ~ 1994  . Multiple tooth extractions  ~ 2008  . Multiple extractions with alveoloplasty N/A 07/08/2013    Procedure: Extraction of tooth #'s 22, 27 with alveoloplasty and bilateal mandibular facial exostoses reductions;  Surgeon: Lenn Cal, DDS;  Location: [facility];  Service: Oral Surgery;  Laterality: N/A;  . Colonoscopy w/ biopsies and polypectomy      benign  . Direct laryngoscopy  01/05/2014  . Direct laryngoscopy N/A 01/05/2014    Procedure: DIRECT LARYNGOSCOPY AND  BIOPSY;  Surgeon: [name], MD;  Location: [place];  Service: ENT;  Laterality: N/A;  . Left and right heart catheterization with coronary/graft angiogram N/A 12/31/2010    Procedure: LEFT AND RIGHT HEART CATHETERIZATION WITH Beatrix Fetters;  Surgeon: Laverda Page, MD;  Location: [facility] CATH LAB;  Service: Cardiovascular;  Laterality: N/A;  . Colonoscopy Left 04/11/2014    Procedure: COLONOSCOPY;  Surgeon: Arta Silence, MD;  Location: [facility] ENDOSCOPY;  Service: Endoscopy;  Laterality: Left;   Family History  Problem Relation Age of Onset  . Cancer Sister     breast ca   History  Substance Use Topics  . Smoking status: Former Smoker -- 42 years    Types: Cigarettes, Cigars    Quit date: 06/17/2013  . Smokeless tobacco: Never Used     Comment: "Quit smoking cigarettes in ~  2007"  . 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 Authorizing Provider  Cholecalciferol (VITAMIN D-3 PO) Take 1,000 Units by mouth daily.   Yes Historical Provider, MD  olmesartan (BENICAR) 40 MG tablet Take 40 mg by mouth daily. 09/19/13  Yes Kristine Garbe, MD  spironolactone (ALDACTONE) 25 MG tablet Take 25 mg by mouth every morning.  09/18/11  Yes Debbe Odea, MD  Alum &  Mag Hydroxide-Simeth (MAGIC MOUTHWASH W/LIDOCAINE) SOLN 1 part 2% viscous lidocaine, 1 part water. Swish, gargle, and/or swallow 55mL QID, prn sore mouth/throat. Use 77min before meals & bedtime. Patient not taking: Reported on 04/08/2014 08/08/13   Eppie Gibson, MD  diphenhydrAMINE (BENADRYL) 25 MG tablet Take 1 tablet (25 mg total) by mouth every 6 (six) hours. Patient not taking: Reported on 04/08/2014 10/29/13   Dorie Rank, MD  oxyCODONE-acetaminophen (PERCOCET) 10-325 MG per tablet Take 1 tablet by mouth every 6 (six) hours as needed for pain. Patient not taking:  Reported on 04/18/2014 11/11/13   Eppie Gibson, MD  pentoxifylline (TRENTAL) 400 MG CR tablet Take 1 tab daily x 1 week, then take 1 tab BID thereafter Patient taking differently: Take 400 mg by mouth.  01/22/14   Eppie Gibson, MD  polyethylene glycol [facility] / Floria Raveling) packet Take 17 g by mouth daily as needed (for constipation).     Historical Provider, MD  tadalafil (CIALIS) 20 MG tablet Take 20 mg by mouth daily as needed for erectile dysfunction.     Historical Provider, MD  vitamin E (VITAMIN E) 400 UNIT capsule Take 1 cap daily x 1 week, then take 1 cap BID thereafter Patient taking differently: Take 400 Units by mouth daily.  01/22/14   Eppie Gibson, MD   BP 105/64 mmHg  Pulse 106  Temp(Src) 98.7 F (37.1 C) (Oral)  Resp 28  Ht 6\' 1"  (1.854 m)  Wt 305 lb (138.347 kg)  BMI 40.25 kg/m2  SpO2 96% Physical Exam  Constitutional: He is oriented to person, place, and time. Vital signs are normal. He appears well-developed and well-nourished.  Non-toxic appearance. He does not appear ill. No distress.  Obese male  HENT:  Head: Normocephalic and atraumatic.  Nose: Nose normal.  Mouth/Throat: Oropharynx is clear and moist. No oropharyngeal exudate.  Eyes: Conjunctivae and EOM are normal. Pupils are equal, round, and reactive to light. No scleral icterus.  Neck: Normal range of motion. Neck supple. No tracheal deviation, no edema, no erythema and normal range of motion present. No thyroid mass and no thyromegaly present.  Cardiovascular: Regular rhythm, S1 normal, S2 normal, normal heart sounds, intact distal pulses and normal pulses.  Exam reveals no gallop and no friction rub.   No murmur heard. Pulses:      Radial pulses are 2+ on the right side, and 2+ on the left side.       Dorsalis pedis pulses are 2+ on the right side, and 2+ on the left side.  Tachycardic  Pulmonary/Chest: Effort normal and breath sounds normal. No respiratory distress. He has no wheezes. He has no rhonchi. He has  no rales.  Abdominal: Soft. Normal appearance and bowel sounds are normal. He exhibits no distension, no ascites and no mass. There is no hepatosplenomegaly. There is no tenderness. There is no rebound, no guarding and no CVA tenderness.  Musculoskeletal: Normal range of motion. He exhibits no edema or tenderness.  Lymphadenopathy:    He has no cervical adenopathy.  Neurological: He is alert and oriented to person, place, and time. He has normal strength. No cranial nerve deficit or sensory deficit. He exhibits normal muscle tone.  Skin: Skin is warm, dry and intact. No petechiae and no rash noted. He is not diaphoretic. No erythema. No pallor.  Nursing note and vitals reviewed.   ED Course  Procedures (including critical care time) Labs Review Labs Reviewed  CBC - Abnormal; Notable for the following:  RBC 3.06 (*)    Hemoglobin 8.8 (*)    HCT 27.3 (*)    RDW 18.1 (*)    All other components within normal limits  COMPREHENSIVE METABOLIC PANEL - Abnormal; Notable for the following:    Potassium 3.4 (*)    Glucose, Bld 115 (*)    Calcium 8.2 (*)    Total Protein 5.9 (*)    GFR calc non Af Amer 75 (*)    GFR calc Af Amer 87 (*)    Anion gap 2 (*)    All other components within normal limits  CBC WITH DIFFERENTIAL/PLATELET - Abnormal; Notable for the following:    RBC 2.56 (*)    Hemoglobin 7.4 (*)    HCT 22.8 (*)    RDW 18.1 (*)    Neutrophils Relative % 85 (*)    Lymphocytes Relative 8 (*)    Lymphs Abs 0.5 (*)    All other components within normal limits  POC OCCULT BLOOD, ED - Abnormal; Notable for the following:    Fecal Occult Bld POSITIVE (*)    All other components within normal limits  TYPE AND SCREEN  ABO/RH    Imaging Review No results found.   EKG Interpretation   Date/Time:  Tuesday April 18 2014 01:47:37 EST Ventricular Rate:  101 PR Interval:  169 QRS Duration: 104 QT Interval:  360 QTC Calculation: 467 R Axis:   -60 Text Interpretation:  Sinus  tachycardia LAD, consider left anterior  fascicular block Low voltage, precordial leads Consider anterior infarct  Confirmed by Glynn Octave [phone]) on 04/18/2014 2:57:46 AM      MDM   Final diagnoses:  None    Patient since emergency department for GI bleed. Hemoccult is positive. Initial hemoglobin is 8.8, patient is tachycardic to 120. It is possible that the patient is losing large amounts of blood due to his tachycardia. Will reassess CBC after 3 hours. Repeat hemoglobin reveal 7.4. Patient is no longer safe for discharge as he will likely dip below 7. Family medicine was called for admission, they sent the patient to [facility] cone telemetry unit. Type and screen was sent. Patient was given 1 L of IV fluids. Tachycardia has decreased to 106, patient is otherwise clinically stable and safe for transfer.  CRITICAL CARE Performed by: Everlene Balls   Total critical care time: 75min. - anemia requiring transfer  Critical care time was exclusive of separately billable procedures and treating other patients.  Critical care was necessary to treat or prevent imminent or life-threatening deterioration.  Critical care was time spent personally by me on the following activities: development of treatment plan with patient and/or surrogate as well as nursing, discussions with consultants, evaluation of patient's response to treatment, examination of patient, obtaining history from patient or surrogate, ordering and performing treatments and interventions, ordering and review of laboratory studies, ordering and review of radiographic studies, pulse oximetry and re-evaluation of patient's condition.     Everlene Balls, MD 04/18/14 [phone]

## 2014-04-18 NOTE — Progress Notes (Signed)
Critical lab called to me, report given to RN coming on shift.  HGB 6.7, MD to be notified.

## 2014-04-18 NOTE — ED Notes (Signed)
Pt reports he was discharged from the hospital for same Saturday.  Ate "spicy beans" last night and started to have bloody diarrhea.  Pt reports bright red blood.

## 2014-04-18 NOTE — Progress Notes (Signed)
Utilization review completed.  

## 2014-04-19 LAB — CBC
HCT: 25.3 % — ABNORMAL LOW (ref 39.0–52.0)
HCT: 26.6 % — ABNORMAL LOW (ref 39.0–52.0)
HEMATOCRIT: 19.7 % — AB (ref 39.0–52.0)
HEMOGLOBIN: 6.4 g/dL — AB (ref 13.0–17.0)
Hemoglobin: 8.1 g/dL — ABNORMAL LOW (ref 13.0–17.0)
Hemoglobin: 8.7 g/dL — ABNORMAL LOW (ref 13.0–17.0)
MCH: 27.7 pg (ref 26.0–34.0)
MCH: 28 pg (ref 26.0–34.0)
MCH: 28.4 pg (ref 26.0–34.0)
MCHC: 32 g/dL (ref 30.0–36.0)
MCHC: 32.5 g/dL (ref 30.0–36.0)
MCHC: 32.7 g/dL (ref 30.0–36.0)
MCV: 85.5 fL (ref 78.0–100.0)
MCV: 86.6 fL (ref 78.0–100.0)
MCV: 87.6 fL (ref 78.0–100.0)
PLATELETS: 201 10*3/uL (ref 150–400)
Platelets: 178 10*3/uL (ref 150–400)
Platelets: 203 10*3/uL (ref 150–400)
RBC: 2.25 MIL/uL — ABNORMAL LOW (ref 4.22–5.81)
RBC: 2.92 MIL/uL — ABNORMAL LOW (ref 4.22–5.81)
RBC: 3.11 MIL/uL — AB (ref 4.22–5.81)
RDW: 17.1 % — ABNORMAL HIGH (ref 11.5–15.5)
RDW: 17.4 % — AB (ref 11.5–15.5)
RDW: 18 % — ABNORMAL HIGH (ref 11.5–15.5)
WBC: 5.4 10*3/uL (ref 4.0–10.5)
WBC: 5.4 10*3/uL (ref 4.0–10.5)
WBC: 5.5 10*3/uL (ref 4.0–10.5)

## 2014-04-19 LAB — BASIC METABOLIC PANEL
ANION GAP: 5 (ref 5–15)
BUN: 6 mg/dL (ref 6–23)
CALCIUM: 8.2 mg/dL — AB (ref 8.4–10.5)
CO2: 24 mmol/L (ref 19–32)
Chloride: 109 mmol/L (ref 96–112)
Creatinine, Ser: 0.95 mg/dL (ref 0.50–1.35)
GFR, EST NON AFRICAN AMERICAN: 89 mL/min — AB (ref >90)
Glucose, Bld: 92 mg/dL (ref 70–99)
POTASSIUM: 4.3 mmol/L (ref 3.5–5.1)
SODIUM: 138 mmol/L (ref 135–145)

## 2014-04-19 LAB — PREPARE RBC (CROSSMATCH)

## 2014-04-19 MED ORDER — SODIUM CHLORIDE 0.9 % IV SOLN
INTRAVENOUS | Status: DC
  Administered 2014-04-19: 20:00:00 via INTRAVENOUS

## 2014-04-19 MED ORDER — PEG 3350-KCL-NA BICARB-NACL 420 G PO SOLR
4000.0000 mL | Freq: Once | ORAL | Status: AC
  Administered 2014-04-19: 4000 mL via ORAL
  Filled 2014-04-19: qty 4000

## 2014-04-19 MED ORDER — SODIUM CHLORIDE 0.9 % IV SOLN
Freq: Once | INTRAVENOUS | Status: AC
  Administered 2014-04-19: 05:00:00 via INTRAVENOUS

## 2014-04-19 MED ORDER — SODIUM CHLORIDE 0.9 % IV SOLN
Freq: Once | INTRAVENOUS | Status: AC
  Administered 2014-04-19: 02:00:00 via INTRAVENOUS

## 2014-04-19 MED ORDER — SODIUM CHLORIDE 0.9 % IV SOLN: Freq: Once | INTRAVENOUS | Status: DC

## 2014-04-19 NOTE — Progress Notes (Signed)
Family Medicine Teaching Service Daily Progress Note Intern Pager: [phone]  Patient name: Melvin Hudson Medical record number: [id number] Date of birth: 07/05/1955 Age: 59 y.o. Gender: male  Primary Care Provider: Kristine Garbe, MD Consultants: GI Code Status: Full  Pt Overview and Major Events to Date:  3/1: admitted for BRBPR 3/2: colon prep; colonoscopy scheduled for 3/3  Assessment and Plan: Matej Sappenfield is a 59 y.o. male presenting with GI bleeding . PMH is significant for diverticulosis, HTN, right tonsil carcinoma  Hematochezia with history of probable diverticular bleed: patient with previous admission requiring subsequent ICU admission for unstableness. No clear source of bleeding identified at that time. Received multiple units of PRBCs. Current presentation is similar. Most likely lower GI but brisk upper GI bleed in differential. Patient is mildly symptomatic and still having intermittent bloody stools. - Cardiac monitoring - GI consult; appreciate recs >> colon prep today, colonoscopy tomorrow; bleed scan if bleeding recurs; angiogram if positive bleed scan. - 2u pRBCs since admission >> Hgb 81 (from 6.4); monitoring - Ca 8.2 (3/2) - CBC q12 hours - PT/INR -  15.8/1.25 - Protonix 40mg  IV BID  Hypokalemia: potassium of 3.4 on admission - S/p Potassium 40meq x4 IV >> 4.3 - Monitor  Squamous cell carcinoma of the Right tonsil: s/p radiation. Sees Dr Isidore Moos for this. CT neck/chest scheduled for April 2016. Biopsy in 12/2013 negative for dysplasia or malignancy.  -Hold home Trental in setting of GI bleed -Hold vitamins while NPO  HTN: normotensive. On benicar and spironolactone at home - Hold BP meds while NPO. Restart when diet advanced  OSA: CPAP at home - CPAP QHS  Chronic pain: takes Percocet 10-325mg  at home - morphine while NPO; restart home medications when diet is advanced  FEN/GI: NS @100ml /hr Prophylaxis: Protonix, SCDs   Disposition: home when  medically stable  Subjective:  Patient is feeling better. Denies any symptoms at this time. No bloody stools overnight. Understands the necessity for the bowel prep. We also discussed the benefits of reducing his caffeine/coffee intake.   Objective: Temp:  [97.7 F (36.5 C)-98.7 F (37.1 C)] 98.4 F (36.9 C) (03/02 0900) Pulse Rate:  [74-109] 86 (03/02 0900) Resp:  [16-19] 16 (03/02 0900) BP: (92-122)/(61-88) 122/76 mmHg (03/02 0900) SpO2:  [95 %-100 %] 100 % (03/02 0900) Physical Exam: General: Well appearing, no distress HEENT: PERRL, MMM; poor dentition Cardiovascular: Regular rate and rhythm, no murmurs appreciated Respiratory: Clear to auscultation bilaterally, no wheezing or rhonchi Abdomen: Soft, non-tender, obese, non-distended GU: deferred at this time. Extremities: Trace edema Skin: No rashes Neuro: Alert, oriented, CN grossly intact, no focal findings  Laboratory:  Recent Labs Lab 04/18/14 1905 04/18/14 2350 04/19/14 0644  WBC 5.9 5.4 5.5  HGB 6.9* 6.4* 8.1*  HCT 21.4* 19.7* 25.3*  PLT 191 178 203    Recent Labs Lab 04/15/14 0501 04/18/14 0059 04/19/14 0644  NA 137 140 138  K 3.3* 3.4* 4.3  CL 106 110 109  CO2 27 28 24   BUN 7 9 6   CREATININE 1.09 1.05 0.95  CALCIUM 7.9* 8.2* 8.2*  PROT  --  5.9*  --   BILITOT  --  0.4  --   ALKPHOS  --  97  --   ALT  --  11  --   AST  --  22  --   GLUCOSE 123* 115* 92    Imaging/Diagnostic Tests: none  Elberta Leatherwood, MD 04/19/2014, 12:13 PM PGY-1, [place] Intern pager:  [phone], text pages welcome

## 2014-04-19 NOTE — Progress Notes (Signed)
Placed patient on CPAP for the night.  Patient is tolerating well at this time. 

## 2014-04-19 NOTE — Progress Notes (Signed)
Patient ID: Melvin Hudson, male   DOB: February 23, 1955, 59 y.o.   MRN: [id number] [facility] Gastroenterology Progress Note  Melvin Hudson 59 y.o. 1955-09-27   Subjective: Reports bloody stool yesterday afternoon but none overnight. S/P 2 U PRBCs and Hgb 8.1 (6.4). Currently receiving 3rd unit PRBCs. Hungry. Feels ok.  Objective: Vital signs in last 24 hours: Filed Vitals:   04/19/14 0756  BP: 103/88  Pulse: 82  Temp: 98.7 F (37.1 C)  Resp: 18    Physical Exam: Gen: alert, no acute distress, obese Abd: minimal epigastric tenderness without guarding, soft, nondistended  Lab Results:  Recent Labs  04/18/14 0059 04/19/14 0644  NA 140 138  K 3.4* 4.3  CL 110 109  CO2 28 24  GLUCOSE 115* 92  BUN 9 6  CREATININE 1.05 0.95  CALCIUM 8.2* 8.2*    Recent Labs  04/18/14 0059  AST 22  ALT 11  ALKPHOS 97  BILITOT 0.4  PROT 5.9*  ALBUMIN 3.6    Recent Labs  04/18/14 0347  04/18/14 2350 04/19/14 0644  WBC 6.4  < > 5.4 5.5  NEUTROABS 5.4  --   --   --   HGB 7.4*  < > 6.4* 8.1*  HCT 22.8*  < > 19.7* 25.3*  MCV 89.1  < > 87.6 86.6  PLT 257  < > 178 203  < > = values in this interval not displayed.  Recent Labs  04/18/14 1043  LABPROT 15.8*  INR 1.25      Assessment/Plan: Lower GI bleed likely diverticular that seems to be resolving - if bleeding recurs, then do bleeding scan and if positive do angiogram. Keep on clear liquids. Colon prep today for colonoscopy tomorrow since incomplete one done last week in the setting of active bleeding. Follow H/Hs.   Alda C. 04/19/2014, 8:44 AM

## 2014-04-20 ENCOUNTER — Inpatient Hospital Stay (HOSPITAL_COMMUNITY): Payer: Medicare Other | Admitting: Certified Registered"

## 2014-04-20 ENCOUNTER — Encounter (HOSPITAL_COMMUNITY)
Admission: EM | Disposition: A | Discharge status: Home / Self Care | Payer: Self-pay | Source: Home / Self Care | Attending: Family Medicine

## 2014-04-20 ENCOUNTER — Encounter (HOSPITAL_COMMUNITY): Payer: Self-pay | Admitting: Certified Registered"

## 2014-04-20 DIAGNOSIS — G894 Chronic pain syndrome: Secondary | ICD-10-CM | POA: Insufficient documentation

## 2014-04-20 HISTORY — PX: COLONOSCOPY: SHX5424

## 2014-04-20 LAB — TYPE AND SCREEN
ABO/RH(D): O POS
ANTIBODY SCREEN: NEGATIVE
UNIT DIVISION: 0
Unit division: 0
Unit division: 0
Unit division: 0
Unit division: 0

## 2014-04-20 LAB — CBC
HEMATOCRIT: 26.2 % — AB (ref 39.0–52.0)
HEMOGLOBIN: 8.5 g/dL — AB (ref 13.0–17.0)
MCH: 28 pg (ref 26.0–34.0)
MCHC: 32.4 g/dL (ref 30.0–36.0)
MCV: 86.2 fL (ref 78.0–100.0)
PLATELETS: 215 10*3/uL (ref 150–400)
RBC: 3.04 MIL/uL — AB (ref 4.22–5.81)
RDW: 17.3 % — ABNORMAL HIGH (ref 11.5–15.5)
WBC: 4.8 10*3/uL (ref 4.0–10.5)

## 2014-04-20 SURGERY — COLONOSCOPY
Anesthesia: Monitor Anesthesia Care

## 2014-04-20 MED ORDER — LACTATED RINGERS IV SOLN
INTRAVENOUS | Status: DC
  Administered 2014-04-20: 1000 mL via INTRAVENOUS

## 2014-04-20 MED ORDER — PROPOFOL 10 MG/ML IV BOLUS
INTRAVENOUS | Status: DC | PRN
  Administered 2014-04-20: 40 mg via INTRAVENOUS
  Administered 2014-04-20: 30 mg via INTRAVENOUS
  Administered 2014-04-20 (×2): 20 mg via INTRAVENOUS

## 2014-04-20 MED ORDER — PROPOFOL INFUSION 10 MG/ML OPTIME
INTRAVENOUS | Status: DC | PRN
  Administered 2014-04-20: 150 ug/kg/min via INTRAVENOUS

## 2014-04-20 MED ORDER — LIDOCAINE HCL (CARDIAC) 20 MG/ML IV SOLN
INTRAVENOUS | Status: DC | PRN
  Administered 2014-04-20: 60 mg via INTRAVENOUS

## 2014-04-20 MED ORDER — LACTATED RINGERS IV SOLN
INTRAVENOUS | Status: DC | PRN
  Administered 2014-04-20: 08:00:00 via INTRAVENOUS

## 2014-04-20 MED ORDER — PANTOPRAZOLE SODIUM 40 MG IV SOLR
40.0000 mg | INTRAVENOUS | Status: DC
  Filled 2014-04-20: qty 40

## 2014-04-20 MED ORDER — POLYETHYLENE GLYCOL 3350 17 G PO PACK: 17.0000 g | PACK | Freq: Every day | ORAL | Status: DC

## 2014-04-20 NOTE — Op Note (Signed)
[facility] [place], [address]   COLONOSCOPY PROCEDURE REPORT     EXAM DATE: 05/16/14  PATIENT NAME:      Melvin Hudson, Melvin Hudson           MR #:      [id number]  BIRTHDATE:       1955/12/07      VISIT #:     [id number]  ATTENDING:     Wilford Corner, MD     STATUS:     outpatient REFERRING MD: ASA CLASS:        Class III  INDICATIONS:  The patient is a 59 yr old male here for a colonoscopy due to hematochezia. PROCEDURE PERFORMED:     Colonoscopy, diagnostic MEDICATIONS:     Per Anesthesia and Monitored anesthesia care  ESTIMATED BLOOD LOSS:     None  CONSENT: The patient understands the risks and benefits of the procedure and understands that these risks include, but are not limited to: sedation, allergic reaction, infection, perforation and/or bleeding. Alternative means of evaluation and treatment include, among others: physical exam, x-rays, and/or surgical intervention. The patient elects to proceed with this endoscopic procedure.  DESCRIPTION OF PROCEDURE: During intra-op preparation period all mechanical & medical equipment was checked for proper function. Hand hygiene and appropriate measures for infection prevention was taken. After the risks, benefits and alternatives of the procedure were thoroughly explained, Informed consent was verified, confirmed and timeout was successfully executed by the treatment team. A digital exam revealed no abnormalities of the rectum.      The Pentax Ped Colon [id number] endoscope was introduced through the anus and advanced to the cecum, which was identified by both the appendix and ileocecal valve. The prep was good.. The instrument was then slowly withdrawn as the colon was fully examined. Diffuse small and large left-sided diverticuli noted without any active bleeding. A tiny clot seen in the sigmoid colon that without a source identified. No other blood products seen. Right  colon unrevealing. Terminal ileum was intubated and was normal in appearance.      Retroflexed views revealed medium-sized internal hemorrhoids.  The scope was then completely withdrawn from the patient and the procedure terminated.     ADVERSE EVENTS:      There were no immediate complications.   IMPRESSIONS:     Diffuse left-sided diverticulosis Medium-sized internal hemorrhoids Normal terminal ileum No active bleeding Suspect previous bleeding due to a diverticular source that has resolved  RECOMMENDATIONS:     Soft solids and advance; Repeat colon in 10 years   Wilford Corner, MD eSigned:  Wilford Corner, MD 05/16/2014 8:50 AM   cc:  CPT CODES: ICD CODES:  The ICD and CPT codes recommended by this software are interpretations from the data that the clinical staff has captured with the software.  The verification of the translation of this report to the ICD and CPT codes and modifiers is the sole responsibility of the health care institution and practicing physician where this report was generated.  [employer] will not be held responsible for the validity of the ICD and CPT codes included on this report.  AMA assumes no liability for data contained or not contained herein. CPT is a Designer, television/film set of the [employer].  PATIENT NAME:  Melvin Hudson MR#: [id number]

## 2014-04-20 NOTE — Discharge Summary (Signed)
[facility] Discharge Summary  Patient name: Melvin Hudson Medical record number: [id number] Date of birth: 1955-05-28 Age: 59 y.o. Gender: male Date of Admission: 04/18/2014  Date of Discharge: 04/20/2014  Admitting Physician: Willeen Niece, MD  Primary Care Provider: Kristine Garbe, MD Consultants: GI  Indication for Hospitalization: Bright red blood per rectum  Discharge Diagnoses/Problem List:  Hematochezia Diverticuli, left-sided Internal hemorrhoids Hypokalemia Hypertension OSA  Disposition: Home  Discharge Condition: Stable  Discharge Exam:  General: Well appearing, no distress HEENT: PERRL, MMM; poor dentition Cardiovascular: Regular rate and rhythm, no murmurs appreciated Respiratory: Clear to auscultation bilaterally, no wheezing or rhonchi Abdomen: Soft, non-tender, obese, non-distended GU: deferred at this time. Extremities: Trace edema Skin: No rashes Neuro: Alert, oriented, CN grossly intact, no focal findings  Brief Hospital Course:  Patient is a 81-year-old male who presented with 3-4 episodes of bright red blood per rectum. Patient had been recently admitted to the hospital secondary to a GI bleed which had required a period of time in the ICU. After his previous discharge and had no irregular movements until the night prior to admission. At that time he experienced multiple episodes of bright red blood per rectum. Patient went to the [facility] urgency department for evaluation. At the ED he complained of some lightheadedness but denied any shortness of breath chest pain nausea vomiting fever or abdominal pain. In the ED he had one bloody bowel movement. Initial hemoglobin was 8.8, this eventually dropped to 7.4, and later to 6.4. FOBT was positive. Patient was provided a total of 2 units pack red blood cells. After these 2 units patient had a hemoglobin of 8.1. He remained stable at that point. Patient was discharged with a hemoglobin of  8.5.  Gastroenterology was consulted upon admission. They found it necessary to have patient undergo a colon prep for colonoscopy. Patient underwent colonoscopy on 3/3. Colonoscopy found evidence of left-sided diverticulosis, internal hemorrhoids, and no active bleeding. Gastroenterology recommended no further workup at this time and signed off.  Patient was deemed stable and ready for discharge. Upon discharge patient was completely asymptomatic and reported being ready to go home. Patient was discharged from our care with a new prescription of MiraLAX. Prescription was written to have patient take this once daily.  Issues for Follow Up:  1. Follow-up hemoglobin. 2. Patient has a significant amount of coffee intake daily; he reports ~1.5 pots of coffee each morning. We have asked that he try reduce this to a cup of coffee a day -- this will likely be difficult.  Significant Procedures: colonoscopy  Significant Labs and Imaging:   Recent Labs Lab 04/19/14 0644 04/19/14 1625 04/20/14 0547  WBC 5.5 5.4 4.8  HGB 8.1* 8.7* 8.5*  HCT 25.3* 26.6* 26.2*  PLT 203 201 215    Recent Labs Lab 04/14/14 0206 04/15/14 0501 04/18/14 0059 04/19/14 0644  NA 138 137 140 138  K 3.4* 3.3* 3.4* 4.3  CL 104 106 110 109  CO2 28 27 28 24   GLUCOSE 99 123* 115* 92  BUN 5* 7 9 6   CREATININE 1.07 1.09 1.05 0.95  CALCIUM 8.1* 7.9* 8.2* 8.2*  ALKPHOS  --   --  97  --   AST  --   --  22  --   ALT  --   --  11  --   ALBUMIN  --   --  3.6  --     Results/Tests Pending at Time of Discharge: none  Discharge Medications:    Medication List    ASK your doctor about these medications        diphenhydrAMINE 25 MG tablet  Commonly known as:  BENADRYL  Take 1 tablet (25 mg total) by mouth every 6 (six) hours.     magic mouthwash w/lidocaine Soln  1 part 2% viscous lidocaine, 1 part water. Swish, gargle, and/or swallow 6mL QID, prn sore mouth/throat. Use 30min before meals & bedtime.      olmesartan 40 MG tablet  Commonly known as:  BENICAR  Take 40 mg by mouth daily.     oxyCODONE-acetaminophen 10-325 MG per tablet  Commonly known as:  PERCOCET  Take 1 tablet by mouth every 6 (six) hours as needed for pain.     pentoxifylline 400 MG CR tablet  Commonly known as:  TRENTAL  Take 1 tab daily x 1 week, then take 1 tab BID thereafter     polyethylene glycol packet  Commonly known as:  MIRALAX / GLYCOLAX  Take 17 g by mouth daily as needed (for constipation).     spironolactone 25 MG tablet  Commonly known as:  ALDACTONE  Take 25 mg by mouth every morning.     tadalafil 20 MG tablet  Commonly known as:  CIALIS  Take 20 mg by mouth daily as needed for erectile dysfunction.     VITAMIN D-3 PO  Take 1,000 Units by mouth daily.     vitamin E 400 UNIT capsule  Commonly known as:  vitamin E  Take 1 cap daily x 1 week, then take 1 cap BID thereafter        Discharge Instructions: Please refer to Patient Instructions section of EMR for full details.  Patient was counseled important signs and symptoms that should prompt return to medical care, changes in medications, dietary instructions, activity restrictions, and follow up appointments.   Follow-Up Appointments:     Follow-up Information    Follow up with Kristine Garbe, MD On 04/25/2014.   Specialty:  Family Medicine   Why:  @ 10:00AM   Contact information:   [address] [place] [address] [phone]       Elberta Leatherwood, MD 04/20/2014, 2:08 PM PGY-1, [place]

## 2014-04-20 NOTE — Anesthesia Postprocedure Evaluation (Signed)
Anesthesia Post Note  Patient: Melvin Hudson  Procedure(s) Performed: Procedure(s) (LRB): COLONOSCOPY (N/A)  Anesthesia type: general  Patient location: PACU  Post pain: Pain level controlled  Post assessment: Patient's Cardiovascular Status Stable  Last Vitals:  Filed Vitals:   04/20/14 0850  BP:   Pulse: 85  Temp:   Resp: 17    Post vital signs: Reviewed and stable  Level of consciousness: sedated  Complications: No apparent anesthesia complications

## 2014-04-20 NOTE — H&P (View-Only) (Signed)
Patient ID: Melvin Hudson, male   DOB: 1955/12/23, 59 y.o.   MRN: [id number] [facility] Gastroenterology Progress Note  Melvin Hudson 59 y.o. 1955-05-31   Subjective: Reports bloody stool yesterday afternoon but none overnight. S/P 2 U PRBCs and Hgb 8.1 (6.4). Currently receiving 3rd unit PRBCs. Hungry. Feels ok.  Objective: Vital signs in last 24 hours: Filed Vitals:   04/19/14 0756  BP: 103/88  Pulse: 82  Temp: 98.7 F (37.1 C)  Resp: 18    Physical Exam: Gen: alert, no acute distress, obese Abd: minimal epigastric tenderness without guarding, soft, nondistended  Lab Results:  Recent Labs  04/18/14 0059 04/19/14 0644  NA 140 138  K 3.4* 4.3  CL 110 109  CO2 28 24  GLUCOSE 115* 92  BUN 9 6  CREATININE 1.05 0.95  CALCIUM 8.2* 8.2*    Recent Labs  04/18/14 0059  AST 22  ALT 11  ALKPHOS 97  BILITOT 0.4  PROT 5.9*  ALBUMIN 3.6    Recent Labs  04/18/14 0347  04/18/14 2350 04/19/14 0644  WBC 6.4  < > 5.4 5.5  NEUTROABS 5.4  --   --   --   HGB 7.4*  < > 6.4* 8.1*  HCT 22.8*  < > 19.7* 25.3*  MCV 89.1  < > 87.6 86.6  PLT 257  < > 178 203  < > = values in this interval not displayed.  Recent Labs  04/18/14 1043  LABPROT 15.8*  INR 1.25      Assessment/Plan: Lower GI bleed likely diverticular that seems to be resolving - if bleeding recurs, then do bleeding scan and if positive do angiogram. Keep on clear liquids. Colon prep today for colonoscopy tomorrow since incomplete one done last week in the setting of active bleeding. Follow H/Hs.   [place] C. 04/19/2014, 8:44 AM

## 2014-04-20 NOTE — Anesthesia Preprocedure Evaluation (Addendum)
Anesthesia Evaluation  Patient identified by MRN, date of birth, ID band Patient awake    Reviewed: Allergy & Precautions, NPO status , Patient's Chart, lab work & pertinent test results  Airway Mallampati: III  TM Distance: >3 FB Neck ROM: Full    Dental  (+) Edentulous Upper, Edentulous Lower, Dental Advisory Given   Pulmonary sleep apnea and Continuous Positive Airway Pressure Ventilation , former smoker,          Cardiovascular hypertension, Pt. on medications     Neuro/Psych    GI/Hepatic   Endo/Other    Renal/GU      Musculoskeletal   Abdominal   Peds  Hematology   Anesthesia Other Findings   Reproductive/Obstetrics                            Anesthesia Physical Anesthesia Plan  ASA: III  Anesthesia Plan: MAC   Post-op Pain Management:    Induction: Intravenous  Airway Management Planned: Nasal Cannula  Additional Equipment: None  Intra-op Plan:   Post-operative Plan:   Informed Consent: I have reviewed the patients History and Physical, chart, labs and discussed the procedure including the risks, benefits and alternatives for the proposed anesthesia with the patient or authorized representative who has indicated his/her understanding and acceptance.   Dental advisory given  Plan Discussed with: Surgeon and CRNA  Anesthesia Plan Comments:        Anesthesia Quick Evaluation

## 2014-04-20 NOTE — Transfer of Care (Signed)
Immediate Anesthesia Transfer of Care Note  Patient: Melvin Hudson  Procedure(s) Performed: Procedure(s): COLONOSCOPY (N/A)  Patient Location: Endoscopy Unit  Anesthesia Type:MAC  Level of Consciousness: awake  Airway & Oxygen Therapy: Patient Spontanous Breathing and Patient connected to nasal cannula oxygen  Post-op Assessment: Report given to RN, Post -op Vital signs reviewed and stable and Patient moving all extremities  Post vital signs: Reviewed and stable  Last Vitals:  Filed Vitals:   04/20/14 0752  BP: 132/79  Pulse:   Temp: 36.9 C  Resp: 23    Complications: No apparent anesthesia complications

## 2014-04-20 NOTE — Discharge Instructions (Signed)
I have added MiraLax to your daily medications. I believe this will help prevent any constipation which is likely causing some of the issues you were experiencing. I would like you to take this once a day. I would also like to ask you to reduce your coffee intake to only one cup of coffee a day. This may not be easy for you but will be very important. This reduction in coffee intake is another reason the Miralax may prove helpful -- as you will no longer have the coffee in your system to loosen each bowel movement.  Gastrointestinal Bleeding Gastrointestinal (GI) bleeding means there is bleeding somewhere along the digestive tract, between the mouth and anus. CAUSES  There are many different problems that can cause GI bleeding. Possible causes include:  Esophagitis. This is inflammation, irritation, or swelling of the esophagus.  Hemorrhoids.These are veins that are full of blood (engorged) in the rectum. They cause pain, inflammation, and may bleed.  Anal fissures.These are areas of painful tearing which may bleed. They are often caused by passing hard stool.  Diverticulosis.These are pouches that form on the colon over time, with age, and may bleed significantly.  Diverticulitis.This is inflammation in areas with diverticulosis. It can cause pain, fever, and bloody stools, although bleeding is rare.  Polyps and cancer. Colon cancer often starts out as precancerous polyps.  Gastritis and ulcers.Bleeding from the upper gastrointestinal tract (near the stomach) may travel through the intestines and produce black, sometimes tarry, often bad smelling stools. In certain cases, if the bleeding is fast enough, the stools may not be black, but red. This condition may be life-threatening. SYMPTOMS   Vomiting bright red blood or material that looks like coffee grounds.  Bloody, black, or tarry stools. DIAGNOSIS  Your caregiver may diagnose your condition by taking your history and performing a  physical exam. More tests may be needed, including:  X-rays and other imaging tests.  Esophagogastroduodenoscopy (EGD). This test uses a flexible, lighted tube to look at your esophagus, stomach, and small intestine.  Colonoscopy. This test uses a flexible, lighted tube to look at your colon. TREATMENT  Treatment depends on the cause of your bleeding.   For bleeding from the esophagus, stomach, small intestine, or colon, the caregiver doing your EGD or colonoscopy may be able to stop the bleeding as part of the procedure.  Inflammation or infection of the colon can be treated with medicines.  Many rectal problems can be treated with creams, suppositories, or warm baths.  Surgery is sometimes needed.  Blood transfusions are sometimes needed if you have lost a lot of blood. If bleeding is slow, you may be allowed to go home. If there is a lot of bleeding, you will need to stay in the hospital for observation. HOME CARE INSTRUCTIONS   Take any medicines exactly as prescribed.  Keep your stools soft by eating foods that are high in fiber. These foods include whole grains, legumes, fruits, and vegetables. Prunes (1 to 3 a day) work well for many people.  Drink enough fluids to keep your urine clear or pale yellow. SEEK IMMEDIATE MEDICAL CARE IF:   Your bleeding increases.  You feel lightheaded, weak, or you faint.  You have severe cramps in your back or abdomen.  You pass large blood clots in your stool.  Your problems are getting worse. MAKE SURE YOU:   Understand these instructions.  Will watch your condition.  Will get help right away if you are not doing  well or get worse. Document Released: 02/01/2000 Document Revised: 01/21/2012 Document Reviewed: 01/13/2011 [facility] Patient Information 2015 [place], [place]. This information is not intended to replace advice given to you by your health care provider. Make sure you discuss any questions you have with your health care  provider.

## 2014-04-20 NOTE — Interval H&P Note (Signed)
History and Physical Interval Note:  04/20/2014 8:18 AM  Melvin Hudson  has presented today for surgery, with the diagnosis of GI Bleed  The various methods of treatment have been discussed with the patient and family. After consideration of risks, benefits and other options for treatment, the patient has consented to  Procedure(s): COLONOSCOPY (N/A) as a surgical intervention .  The patient's history has been reviewed, patient examined, no change in status, stable for surgery.  I have reviewed the patient's chart and labs.  Questions were answered to the patient's satisfaction.     [place] C.

## 2014-04-20 NOTE — Progress Notes (Signed)
Per the pt "had one stool that was clear and no blood". Will continue to monitor.

## 2014-04-20 NOTE — Progress Notes (Signed)
Pt back from colonoscopy. Pt alert and oriented. C/o of bilateral leg pain which is chronic, rated a 7/10. Gave PRN pain medication. Pt states that he would like to be started back on his normal pain medication that he takes at home. Will contact MD.

## 2014-04-20 NOTE — Brief Op Note (Signed)
Left-sided diverticulosis. Internal hemorrhoids. No bleeding seen. No further GI workup needed at this time. Advance diet. F/U in my office prn. Will sign off. Call if questions.

## 2014-04-21 ENCOUNTER — Encounter (HOSPITAL_COMMUNITY): Payer: Self-pay | Admitting: Gastroenterology

## 2014-05-08 ENCOUNTER — Telehealth: Payer: Self-pay | Admitting: *Deleted

## 2014-05-08 NOTE — Telephone Encounter (Signed)
Patient called re: April appts for CT at visit with Dr. Isidore Moos.  I provided clarification for location of CT scan and check-in procedure for RadOnc appts (new since patient last here).  Gayleen Orem, RN, BSN, [place] [facility] [place] [phone]

## 2014-05-25 ENCOUNTER — Encounter (HOSPITAL_COMMUNITY): Payer: Self-pay

## 2014-05-25 ENCOUNTER — Ambulatory Visit (HOSPITAL_COMMUNITY)
Admission: RE | Admit: 2014-05-25 | Discharge: 2014-05-25 | Disposition: A | Discharge status: Home / Self Care | Payer: Medicare Other | Source: Ambulatory Visit | Attending: Radiation Oncology | Admitting: Radiation Oncology

## 2014-05-25 DIAGNOSIS — I2510 Atherosclerotic heart disease of native coronary artery without angina pectoris: Secondary | ICD-10-CM | POA: Insufficient documentation

## 2014-05-25 DIAGNOSIS — M47812 Spondylosis without myelopathy or radiculopathy, cervical region: Secondary | ICD-10-CM | POA: Diagnosis not present

## 2014-05-25 DIAGNOSIS — Z923 Personal history of irradiation: Secondary | ICD-10-CM | POA: Diagnosis not present

## 2014-05-25 DIAGNOSIS — I700 Atherosclerosis of aorta: Secondary | ICD-10-CM | POA: Insufficient documentation

## 2014-05-25 DIAGNOSIS — R911 Solitary pulmonary nodule: Secondary | ICD-10-CM | POA: Insufficient documentation

## 2014-05-25 DIAGNOSIS — C099 Malignant neoplasm of tonsil, unspecified: Secondary | ICD-10-CM | POA: Insufficient documentation

## 2014-05-25 MED ORDER — IOHEXOL 300 MG/ML  SOLN
100.0000 mL | Freq: Once | INTRAMUSCULAR | Status: AC | PRN
  Administered 2014-05-25: 100 mL via INTRAVENOUS

## 2014-05-26 ENCOUNTER — Encounter: Payer: Self-pay | Admitting: Radiation Oncology

## 2014-05-26 ENCOUNTER — Telehealth: Payer: Self-pay | Admitting: *Deleted

## 2014-05-26 ENCOUNTER — Ambulatory Visit
Admission: RE | Admit: 2014-05-26 | Discharge: 2014-05-26 | Disposition: A | Discharge status: Home / Self Care | Payer: Medicare Other | Source: Ambulatory Visit | Attending: Radiation Oncology | Admitting: Radiation Oncology

## 2014-05-26 VITALS — BP 179/102 | HR 66 | Temp 98.3°F | Resp 20 | Wt 305.8 lb

## 2014-05-26 DIAGNOSIS — C099 Malignant neoplasm of tonsil, unspecified: Secondary | ICD-10-CM

## 2014-05-26 DIAGNOSIS — R635 Abnormal weight gain: Secondary | ICD-10-CM

## 2014-05-26 NOTE — Telephone Encounter (Signed)
Per Dr Isidore Moos, spoke with patient and advised him to stop taking Trental (Pentoxfylline) and vitamin E. Informed him that Dr Isidore Moos stated he has good healing in his throat. Mr Melvin Hudson verbalized understanding.

## 2014-05-26 NOTE — Telephone Encounter (Signed)
Per Dr. Isidore Moos, called [facility], spoke with Hedda Slade, requested 42-month f/u for patient with Dr. Constance Holster.  She verbalized understanding, will call patient to schedule.  Gayleen Orem, RN, BSN, [place] [facility] [place] [phone]

## 2014-05-26 NOTE — Progress Notes (Signed)
Pain Status: chronic leg pain  Weight changes, if any: stable  Nutritional Status a) intake: regular diet b) using a feeding tube?: na c) weight changes, if any:   Swallowing Status: normal  Dental (if applicable): When was last visit with dentistry  12/27/13, no FU scheduled  Using fluoride trays daily? no   When was last ENT visit?  Prior to tx When is next ENT visit?  None scheduled  Other notable issues, if any: hypertensive today, saw PCP 05/24/14. Patient is currently calling his PCP with BP reading today. He took his Benicar this morning, states he does not "eat salt". BP 179/102 mmHg  Pulse 66  Temp(Src) 98.3 F (36.8 C) (Oral)  Resp 20  Wt 305 lb 12.8 oz (138.71 kg)

## 2014-05-26 NOTE — Progress Notes (Signed)
Radiation Oncology         [phone]) [phone] ________________________________  Name: Melvin Hudson MRN: [id number]  Date: 05/26/2014  DOB: 27-Mar-1955  Follow-Up Visit Note  REFERRING MD - JEFRY ROSEN   ICD-9-CM ICD-10-CM   1. Malignant neoplasm of tonsil 146.0 C09.9 omeprazole (PRILOSEC) 20 MG capsule     TSH     Ambulatory referral to Physical Therapy  2. 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  Pain Status: chronic leg pain  Nutritional Status a) intake: regular diet; weight increasing  Swallowing Status: normal   When was last ENT visit?  Prior to tx When is next ENT visit?  None scheduled  Other notable issues, if any: hypertensive today, saw PCP 05/24/14. Patient is currently calling his PCP with BP reading today. He took his Benicar this morning, states he does not "eat salt". Has been dealing with some GI bleeding that has resolved after multiple blood infusions. Reports intermittent sharp pain in neck but otherwise denies new pain.  On 4/7 had a CT of neck and chest with no evidence of disease. Stable area of focal density of right glossal tonsillar sulcus which is stable and likely changes secondary to tx.   Doing well overall.  Has GI bleeding issues earlier this year, which resolved after transfusions.   BP 179/102 mmHg  Pulse 66  Temp(Src) 98.3 F (36.8 C) (Oral)  Resp 20  Wt 305 lb 12.8 oz (138.71 kg) ALLERGIES:  is allergic to advil and azilsartan.  Meds: Current  Outpatient Prescriptions  Medication Sig Dispense Refill  . Cholecalciferol (VITAMIN D-3 PO) Take 1,000 Units by mouth daily.    . diphenhydrAMINE (BENADRYL) 25 MG tablet Take 1 tablet (25 mg total) by mouth every 6 (six) hours. 20 tablet 0  . olmesartan (BENICAR) 40 MG tablet Take 40 mg by mouth daily.    [name] omeprazole (PRILOSEC) 20 MG capsule   6  . oxyCODONE-acetaminophen (PERCOCET) 10-325 MG per tablet Take 1 tablet by mouth every 6 (six) hours as needed for pain. 120 tablet 0  . polyethylene glycol (MIRALAX / GLYCOLAX) packet Take 17 g by mouth daily. 14 each 0  . spironolactone (ALDACTONE) 25 MG tablet Take 25 mg by mouth every morning.     . tadalafil (CIALIS) 20 MG tablet Take 20 mg by mouth daily as needed for erectile dysfunction.      No current facility-administered medications for this encounter.    Physical Findings:   weight is 305 lb 12.8 oz (138.71 kg). His oral temperature is 98.3 F (36.8 C). His blood pressure is 179/102 and his pulse is 66. His respiration is 20. Jenetta Downer   General: Alert and oriented, in no acute distress HEENT: Head is normocephalic. Extraocular movements are intact. Oropharynx is clear. Tongue normal to palpation and visual inspection Neck: Neck is supple, Anterior neck lymphedema Heart: Regular in rate and rhythm with no murmurs, rubs, or gallops. Chest: Clear to auscultation bilaterally, with no  rhonchi, wheezes, or rales. Abdomen: Soft, nontender, nondistended, with no rigidity or guarding. Lymphatics: see Neck Exam Skin: No concerning lesions. Psychiatric: Judgment and insight are intact. Affect is appropriate.    Lab Findings: Lab Results  Component Value Date   WBC 4.8 04/20/2014   HGB 8.5* 04/20/2014   HCT 26.2* 04/20/2014   MCV 86.2 04/20/2014   PLT 215 04/20/2014    Lab Results  Component Value Date   TSH 1.035 01/19/2014    Radiographic Findings: Ct Soft Tissue Neck W Contrast  05/25/2014   CLINICAL DATA:  Right palatine  tonsil cancer status post radiation.  EXAM: CT NECK WITH CONTRAST  TECHNIQUE: Multidetector CT imaging of the neck was performed using the standard protocol following the bolus administration of intravenous contrast.  CONTRAST:  171mL OMNIPAQUE IOHEXOL 300 MG/ML  SOLN  COMPARISON:  PET scan 01/19/2014.  CT of the neck 06/23/2013.  FINDINGS: Pharynx and larynx: There is marked reduction and focal soft tissue density in the right glasses tonsillar sulcus. The largest measurable area no scratch the the largest measurable area is now 12.5 x 22.7 mm. This is similar to the prior PET scan. No new mucosal or submucosal lesion is present. Post radiation changes are evident at the tongue base and in the vallecula. The intrinsic tongue musculature is otherwise normal.  Salivary glands: Post radiation changes are evident in the right submandibular gland. The left submandibular gland is within normal limits. The parotid glands are normal bilaterally.  Thyroid: Negative.  Lymph nodes: No significant adenopathy is present.  Vascular: No significant vascular calcifications or stenoses are present.  Limited intracranial: Negative  Visualized orbits: Not imaged  Mastoids and visualized paranasal sinuses: Cleared  Skeleton: Degenerative changes of the cervical spine are again noted. There is fusion across the disc space at C5-6. Endplate degenerative changes at C3-4, C4-5, and C6-7 are similar to the prior exam. There is significant left-sided osseous foraminal narrowing in each of these levels. Right-sided osseous foraminal narrowing is present at C3-4 and C4-5.  Upper chest: Apical blebs are stable. A subpleural nodule in the right upper lobe is unchanged.  IMPRESSION: 1. Focal density at the right glossal tonsillar sulcus is stable, likely reflecting the sequela of prior therapy. 2. No evidence for progressive residual or recurrent disease. 3. No significant adenopathy. 4. Stable moderate spondylosis of cervical spine. 5. Stable  subpleural nodule in the right upper lobe, likely a lymph node.   Electronically Signed   By: San Morelle M.D.   On: 05/25/2014 13:07   Ct Chest W Contrast  05/25/2014   CLINICAL DATA:  Subsequent evaluation of a 59 year old male with history of tonsillar cancer. Followup study.  EXAM: CT CHEST WITH CONTRAST  TECHNIQUE: Multidetector CT imaging of the chest was performed during intravenous contrast administration.  CONTRAST:  169mL OMNIPAQUE IOHEXOL 300 MG/ML  SOLN  COMPARISON:  PET-CT 01/19/2014.  FINDINGS: Mediastinum/Lymph Nodes: Heart size is normal. There is no significant pericardial fluid, thickening or pericardial calcification. There is atherosclerosis of the thoracic aorta, the great vessels of the mediastinum and the coronary arteries, including calcified atherosclerotic plaque in the left anterior descending and left circumflex coronary arteries. No pathologically enlarged mediastinal or hilar lymph nodes. Esophagus is unremarkable in appearance. No axillary lymphadenopathy.  Lungs/Pleura: 3 mm subpleural nodule in the periphery of the right upper lobe (image 15 of series 7), unchanged compared to prior PET-CT 01/19/2014, favored to represent a subpleural lymph node. No other larger more suspicious appearing pulmonary  nodules or masses are otherwise noted. No acute consolidative airspace disease. No pleural effusions.  Upper Abdomen: Unremarkable.  Musculoskeletal/Soft Tissues: There are no aggressive appearing lytic or blastic lesions noted in the visualized portions of the skeleton.  IMPRESSION: 1. No findings to suggest metastatic disease to the thorax on today's examination. 2. 3 mm subpleural nodule in the periphery of the right upper lobe, unchanged, favored to represent a small subpleural lymph node. 3. Atherosclerosis, including 2 vessel coronary artery disease. Please note that although the presence of coronary artery calcium documents the presence of coronary artery disease, the  severity of this disease and any potential stenosis cannot be assessed on this non-gated CT examination. Assessment for potential risk factor modification, dietary therapy or pharmacologic therapy may be warranted, if clinically indicated.   Electronically Signed   By: Vinnie Langton M.D.   On: 05/25/2014 12:46     Impression/Plan:    1) Head and Neck Cancer Status:  NED  2) Nutritional Status:no issues, no PEG  3) Risk Factors: The patient has been educated about risk factors including alcohol and tobacco abuse; they understand that avoidance of alcohol and tobacco is important to prevent recurrences as well as other cancers. Not using tobacco.  4) Swallowing: no issues  5) Dental: edentulous  6) Energy: tsh normal in Dec.  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 The patient was encouraged to call with any issues or questions before then  Liliane Channel will contact Dr. Janeice Robinson office for f/u in 3 months and I will see him in 6 months  This document serves as a record of services personally performed by Eppie Gibson, MD. It was created on her behalf by Pearlie Oyster, a trained medical scribe. The creation of this record is based on the scribe's personal observations and the provider's statements to them. This document has been checked and approved by the attending provider.      _____________________________________   Eppie Gibson, MD

## 2014-05-29 ENCOUNTER — Telehealth: Payer: Self-pay | Admitting: *Deleted

## 2014-05-29 NOTE — Telephone Encounter (Signed)
CALLED PATIENT TO INFORM OF LAB ON 11-24-14 @ 10 AM AND HIS FU WITH DR. Isidore Moos ON 11-24-14 @ 10:20 AM, SPOKE WITH PATIENT AND HE IS AWARE OF THESE APPTS.

## 2014-07-10 ENCOUNTER — Ambulatory Visit: Payer: Medicare Other | Admitting: Physical Therapy

## 2014-07-10 ENCOUNTER — Ambulatory Visit: Payer: Medicare Other | Attending: Radiation Oncology | Admitting: Physical Therapy

## 2014-07-10 VITALS — BP 165/110

## 2014-07-10 DIAGNOSIS — I890 Lymphedema, not elsewhere classified: Secondary | ICD-10-CM | POA: Insufficient documentation

## 2014-07-10 DIAGNOSIS — R5381 Other malaise: Secondary | ICD-10-CM | POA: Diagnosis not present

## 2014-07-10 NOTE — Therapy (Signed)
[place], [place], [address] Phone: [phone]   Fax:  [phone]  Physical Therapy Evaluation  Patient Details  Name: Melvin Hudson MRN: [id number] Date of Birth: 1955-12-09 Referring Provider:  Eppie Gibson, MD  Encounter Date: 07/10/2014      PT End of Session - 07/10/14 2155    Visit Number 1   Number of Visits 9   Date for PT Re-Evaluation 08/18/14   PT Start Time 5366   PT Stop Time 1600   PT Time Calculation (min) 30 min   Activity Tolerance Treatment limited secondary to medical complications (Comment)   Behavior During Therapy [facility] for tasks assessed/performed      Past Medical History  Diagnosis Date  . Hypertension   . Angioedema 09/17/11    tongue and lips  . Obesity   . Hx of gout   . S/P radiation therapy 07/26/2013-09/15/2013    Right tonsil/bilateral neck/ 7000 cGy  . Arthritis     "both of my legs and feet" (01/05/2014)  . OSA on CPAP   . 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 Knee arthroscopy Right ~ 1994  . Multiple tooth extractions  ~ 2008  . Multiple extractions with alveoloplasty N/A 07/08/2013    Procedure: Extraction of tooth #'s 22, 27 with alveoloplasty and bilateal mandibular facial exostoses reductions;  Surgeon: Lenn Cal, DDS;  Location: [facility];  Service: Oral Surgery;  Laterality: N/A;  . Colonoscopy w/ biopsies and polypectomy      benign  . Direct laryngoscopy  01/05/2014  . Direct laryngoscopy N/A 01/05/2014    Procedure: DIRECT LARYNGOSCOPY AND BIOPSY;  Surgeon: [name], MD;  Location: [place];  Service: ENT;  Laterality: N/A;  . Left and right heart catheterization with coronary/graft angiogram N/A 12/31/2010    Procedure: LEFT AND RIGHT HEART CATHETERIZATION WITH Beatrix Fetters;  Surgeon: Laverda Page, MD;  Location: [facility] CATH LAB;  Service: Cardiovascular;   Laterality: N/A;  . Colonoscopy Left 04/11/2014    Procedure: COLONOSCOPY;  Surgeon: Arta Silence, MD;  Location: [facility] ENDOSCOPY;  Service: Endoscopy;  Laterality: Left;  . Colonoscopy N/A 04/20/2014    Procedure: COLONOSCOPY;  Surgeon: Lear Ng, MD;  Location: [facility] ENDOSCOPY;  Service: Endoscopy;  Laterality: N/A;    Filed Vitals:   07/10/14 1533  BP: 165/110    Visit Diagnosis:  Physical deconditioning - Plan: PT plan of care cert/re-cert  Acquired lymphedema - Plan: PT plan of care cert/re-cert      Subjective Assessment - 07/10/14 1533    Subjective You don't remember me?  I'm here for the same thing.  Hasn't been doing the massage that much; he does it some, but "not as often as I should."   Pertinent History HTN that he says is not under good control consistently; has started a new medication (metoprolol).  Has put on weight since completing treatment.  Diagnosed with head and neck cancer and finished radiation therapy in mid-July 2015.  Has been treated here in the past (last fall) for lymphedema.    Patient Stated Goals reduce swelling and get in better shape   Currently in Pain? Yes  occasional sharp throat pain   Pain Score 8    Pain Location Leg   Pain Orientation Right;Left   Pain Descriptors / Indicators Aching   Aggravating Factors  sitting or standing too long   Pain  Relieving Factors pain pill            [facility] PT Assessment - 07/10/14 0001    Assessment   Medical Diagnosis head/neck cancer   Onset Date/Surgical Date 08/31/13  approx. date of finishing radiation therapy   Precautions   Precautions Other (comment)  cancer precautions   Restrictions   Weight Bearing Restrictions No   Balance Screen   Has the patient fallen in the past 6 months No   Has the patient had a decrease in activity level because of a fear of falling?  No   Is the patient reluctant to leave their home because of a fear of falling?  No   Home Environment   Living  Environment Private residence   Living Arrangements Alone   Type of [place] Access Level entry  in front   [place] One level   Prior Function   Level of Independence Needs assistance with homemaking;Independent with basic ADLs  has an attendant 2 hours five days a week   ROM / Strength   AROM / PROM / Strength Strength   Strength   Overall Strength Comments to be assessed at next visit; not assessed today due to high blood pressure reading   Ambulation/Gait   Ambulation/Gait Yes   Ambulation/Gait Assistance 6: Modified independent (Device/Increase time)   Assistive device Straight cane           LYMPHEDEMA/ONCOLOGY QUESTIONNAIRE - 07/10/14 1544    Type   Cancer Type head and neck   Treatment   Past Radiation Treatment Yes   Date 08/31/13  approx.   Lymphedema Stage   Stage STAGE 2 SPONTANEOUSLY IRREVERSIBLE   Lymphedema Assessments   Lymphedema Assessments Head and Neck   Head and Neck   4 cm superior to sternal notch around neck 42.5 cm   6 cm superior to sternal notch around neck 43.5 cm   8 cm superior to sternal notch around neck 46.5 cm                           Short Term Clinic Goals - 12/20/13 1157    CC Short Term Goal  #1   Title short term goals=long term goals             Long Term Clinic Goals - 07/10/14 2205    CC Long Term Goal  #1   Title Patient will be able to verbalize good understanding of the Maintenance Phase of treatment including manual lymph drainage, use of compression, and lymphedema risk reduction practices.    Time 4   Period Weeks   Status New   CC Long Term Goal  #2   Title Patient will be able to reduce edema by 1 cm at 8 cm proximal to sternal notch.    Baseline 46.5 cm.   Time 4   Period Weeks   Status New   CC Long Term Goal  #3   Title Patient will be able to be independent with a home exercise program   Time 4   Period Weeks   Status New            Plan - 07/10/14 2155     Clinical Impression Statement Patient who has been treated at this clinic in the past approx. 6 months ago for neck swelling post radiation treatment returns now for the same problem.  Measurements indicate his swelling is reduced some  2 cm. compared to last measurements her in October 2015, despite patient reporting limited performance of self manual lymph drainage and use of foam chip pack. Nonetheless, he will benefit from manual lymph drainage and review of self-treatment; he would also like to purchase a manufactured compression garment to replace the foam chip pack we put together for him.  He will also benefit from reconditioning exericse, possibly to include LE strengthening to improve muscular support of joints that cause him pain currently.   I did not test his LE strength today due to high diastolic blood pressure; he does walk with a cane.                                                    Pt will benefit from skilled therapeutic intervention in order to improve on the following deficits Increased edema;Decreased knowledge of use of DME;Pain;Decreased mobility;Decreased endurance  and possibly decreased LE strength   Rehab Potential Good   PT Frequency 2x / week   PT Duration 4 weeks   PT Treatment/Interventions Manual lymph drainage;Patient/family education;Therapeutic exercise;ADLs/Self Care Home Management   PT Next Visit Plan begin with manual lymph drainage and review of self-massage; discuss commercially manufactured neck compression garments; test LE strength if blood pressure is not high; when able, teach HEP for LE strength as needed and endurance exercise to tolerance given LE joint pain.   Consulted and Agree with Plan of Care Patient          G-Codes - Jul 26, 2014 06/03/2206    Functional Assessment Tool Used clinical judgement   Functional Limitation Self care   Self Care Current Status [phone]) At least 20 percent but less than 40 percent impaired, limited or restricted   Self  Care Goal Status (Z3007) At least 1 percent but less than 20 percent impaired, limited or restricted       Problem List Patient Active Problem List   Diagnosis Date Noted  . Chronic pain syndrome   . History of GI diverticular bleed 04/18/2014  . Essential hypertension   . Acute blood loss anemia   . Lower GI bleed   . Gastrointestinal bleeding, lower   . Encounter for central line placement   . Hematochezia 04/10/2014  . Symptomatic anemia 04/08/2014  . GI bleed 04/08/2014  . 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 Mouth pain 07/18/2013  . Weight loss 07/18/2013  . Post-operative state 07/08/2013  . Post-op pain 07/08/2013  . Malignant neoplasm of tonsil 06/29/2013  . OSA (obstructive sleep apnea) 11/19/2011  . Angio-edema 09/17/2011  . hypotension  09/17/2011  . HTN (hypertension) 09/17/2011  . Gout 09/17/2011  . Tobacco abuse 09/17/2011    [name],[name] 26-Jul-2014, 10:12 PM  [place], [place], [address] Phone: [phone]   Fax:  [place], PT 2014/07/26 10:13 PM

## 2014-07-12 ENCOUNTER — Telehealth: Payer: Self-pay | Admitting: *Deleted

## 2014-07-12 NOTE — Telephone Encounter (Signed)
Received a call from "Rob" from [employer].  Melvin Hudson was hoping to receive a Shingles injection but wanted to make sure it was okay.  Spoke with Dr. Isidore Moos, she stated it was fine.

## 2014-07-24 ENCOUNTER — Ambulatory Visit: Payer: Medicare Other | Attending: Radiation Oncology | Admitting: Physical Therapy

## 2014-07-24 VITALS — BP 128/90

## 2014-07-24 DIAGNOSIS — I890 Lymphedema, not elsewhere classified: Secondary | ICD-10-CM | POA: Diagnosis not present

## 2014-07-24 DIAGNOSIS — R5381 Other malaise: Secondary | ICD-10-CM | POA: Insufficient documentation

## 2014-07-24 NOTE — Therapy (Signed)
[place], [place], [address] Phone: [phone]   Fax:  [phone]  Physical Therapy Treatment  Patient Details  Name: Melvin Hudson MRN: [id number] Date of Birth: 1955/05/04 Referring Provider:  Eppie Gibson, MD  Encounter Date: 07/24/2014      PT End of Session - 07/24/14 1436    Visit Number 2   Number of Visits 9   Date for PT Re-Evaluation 08/18/14   PT Start Time 4818   PT Stop Time 1430   PT Time Calculation (min) 43 min   Activity Tolerance Patient tolerated treatment well   Behavior During Therapy [facility] for tasks assessed/performed      Past Medical History  Diagnosis Date  . Hypertension   . Angioedema 09/17/11    tongue and lips  . Obesity   . Hx of gout   . S/P radiation therapy 07/26/2013-09/15/2013    Right tonsil/bilateral neck/ 7000 cGy  . Arthritis     "both of my legs and feet" (01/05/2014)  . OSA on CPAP   . 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 Knee arthroscopy Right ~ 1994  . Multiple tooth extractions  ~ 2008  . Multiple extractions with alveoloplasty N/A 07/08/2013    Procedure: Extraction of tooth #'s 22, 27 with alveoloplasty and bilateal mandibular facial exostoses reductions;  Surgeon: Lenn Cal, DDS;  Location: [facility];  Service: Oral Surgery;  Laterality: N/A;  . Colonoscopy w/ biopsies and polypectomy      benign  . Direct laryngoscopy  01/05/2014  . Direct laryngoscopy N/A 01/05/2014    Procedure: DIRECT LARYNGOSCOPY AND BIOPSY;  Surgeon: [name], MD;  Location: [place];  Service: ENT;  Laterality: N/A;  . Left and right heart catheterization with coronary/graft angiogram N/A 12/31/2010    Procedure: LEFT AND RIGHT HEART CATHETERIZATION WITH Beatrix Fetters;  Surgeon: Laverda Page, MD;  Location: [facility] CATH LAB;  Service: Cardiovascular;  Laterality: N/A;  . Colonoscopy Left  04/11/2014    Procedure: COLONOSCOPY;  Surgeon: Arta Silence, MD;  Location: [facility] ENDOSCOPY;  Service: Endoscopy;  Laterality: Left;  . Colonoscopy N/A 04/20/2014    Procedure: COLONOSCOPY;  Surgeon: Lear Ng, MD;  Location: [facility] ENDOSCOPY;  Service: Endoscopy;  Laterality: N/A;    Filed Vitals:   07/24/14 1350  BP: 128/90    Visit Diagnosis:  Acquired lymphedema      Subjective Assessment - 07/24/14 1349    Subjective She changed all my pressure medications.  She raised two meds and gave me a new one.  I feel good.  I go back next month to have it checked.   Currently in Pain? Yes   Pain Score 8    Pain Location Leg   Pain Orientation Right;Left   Aggravating Factors  sitting or standing too long   Pain Relieving Factors pain pill            [facility] PT Assessment - 07/24/14 0001    ROM / Strength   AROM / PROM / Strength Strength   Strength   Overall Strength Deficits   Strength Assessment Site Hip;Knee;Ankle   Right/Left Hip Right;Left   Right Hip Flexion 5/5   Right Hip Extension 4+/5   Right Hip ABduction 5/5   Left Hip Flexion 5/5   Left Hip Extension 4+/5   Left Hip ABduction 5/5   Right/Left Knee Right;Left   Right  Knee Flexion 5/5   Right Knee Extension 5/5   Left Knee Flexion 5/5   Left Knee Extension 5/5   Right/Left Ankle Right;Left   Right Ankle Dorsiflexion 5/5   Right Ankle Inversion 3/5  limited ROM   Right Ankle Eversion 5/5   Left Ankle Dorsiflexion 5/5   Left Ankle Inversion 4/5  approx.; limited ROM   Left Ankle Eversion 5/5                     [facility] Adult PT Treatment/Exercise - 07/24/14 0001    Manual Therapy   Manual Therapy Edema management;Manual Lymphatic Drainage (MLD)   Manual Lymphatic Drainage (MLD) In supine:  supraclavicular fossae, bilat. shoulder collectors, bilateral axillae, bilateral pectoral nodes; lateral, antero-lateral and anterior neck; posterolateral neck; chin, cheeks, and upper cheeks;  superficial and deep abdomen.                        [place] [facility] Goals - 07/10/14 2205    CC Long Term Goal  #1   Title Patient will be able to verbalize good understanding of the Maintenance Phase of treatment including manual lymph drainage, use of compression, and lymphedema risk reduction practices.    Time 4   Period Weeks   Status New   CC Long Term Goal  #2   Title Patient will be able to reduce edema by 1 cm at 8 cm proximal to sternal notch.    Baseline 46.5 cm.   Time 4   Period Weeks   Status New   CC Long Term Goal  #3   Title Patient will be able to be independent with a home exercise program   Time 4   Period Weeks   Status New            Plan - 07/24/14 1427    Clinical Impression Statement Blood pressure was good today.  Patient with fairly good leg strength, which was a bit surprising, given that he needs a cane for safe ambulation and c/o significant pain in both legs.  Slight weakness in hip extensors and ankle invertors (but with limited motion in ankle inversion).   Pt will benefit from skilled therapeutic intervention in order to improve on the following deficits Increased edema;Decreased knowledge of use of DME;Pain;Decreased mobility;Decreased endurance   Rehab Potential Good   PT Treatment/Interventions Manual lymph drainage;Manual techniques   PT Next Visit Plan Look at pictures of and samples of manufactured neck compression garments; continue manual lymph drainage and instruction in that.  Remeasure neck circumferences.   Consulted and Agree with Plan of Care Patient        Problem List Patient Active Problem List   Diagnosis Date Noted  . Chronic pain syndrome   . History of GI diverticular bleed 04/18/2014  . Essential hypertension   . Acute blood loss anemia   . Lower GI bleed   . Gastrointestinal bleeding, lower   . Encounter for central line placement   . Hematochezia 04/10/2014  . Symptomatic anemia 04/08/2014   . GI bleed 04/08/2014  . 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 Mouth pain 07/18/2013  . Weight loss 07/18/2013  . Post-operative state 07/08/2013  . Post-op pain 07/08/2013  . Malignant neoplasm of tonsil 06/29/2013  . OSA (obstructive sleep apnea) 11/19/2011  . Angio-edema 09/17/2011  . hypotension  09/17/2011  . HTN (hypertension) 09/17/2011  . Gout 09/17/2011  . Tobacco abuse 09/17/2011  [place] 07/24/2014, 2:41 PM  [place], [place], [address] Phone: [phone]   Fax:  [place], PT 07/24/2014 2:42 PM

## 2014-07-26 ENCOUNTER — Ambulatory Visit: Payer: Medicare Other | Admitting: Physical Therapy

## 2014-07-26 DIAGNOSIS — I890 Lymphedema, not elsewhere classified: Secondary | ICD-10-CM

## 2014-07-26 NOTE — Therapy (Signed)
[place], [place], [address] Phone: [phone]   Fax:  [phone]  Physical Therapy Treatment  Patient Details  Name: Melvin Hudson MRN: [id number] Date of Birth: 1955/07/02 Referring Provider:  Eppie Gibson, MD  Encounter Date: 07/26/2014      PT End of Session - 07/26/14 2053    Visit Number 3   Number of Visits 9   Date for PT Re-Evaluation 08/18/14   PT Start Time 0160   PT Stop Time 1348   PT Time Calculation (min) 46 min   Activity Tolerance Patient tolerated treatment well   Behavior During Therapy [facility] for tasks assessed/performed      Past Medical History  Diagnosis Date  . Hypertension   . Angioedema 09/17/11    tongue and lips  . Obesity   . Hx of gout   . S/P radiation therapy 07/26/2013-09/15/2013    Right tonsil/bilateral neck/ 7000 cGy  . Arthritis     "both of my legs and feet" (01/05/2014)  . OSA on CPAP   . 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 Knee arthroscopy Right ~ 1994  . Multiple tooth extractions  ~ 2008  . Multiple extractions with alveoloplasty N/A 07/08/2013    Procedure: Extraction of tooth #'s 22, 27 with alveoloplasty and bilateal mandibular facial exostoses reductions;  Surgeon: Lenn Cal, DDS;  Location: [facility];  Service: Oral Surgery;  Laterality: N/A;  . Colonoscopy w/ biopsies and polypectomy      benign  . Direct laryngoscopy  01/05/2014  . Direct laryngoscopy N/A 01/05/2014    Procedure: DIRECT LARYNGOSCOPY AND BIOPSY;  Surgeon: [name], MD;  Location: [place];  Service: ENT;  Laterality: N/A;  . Left and right heart catheterization with coronary/graft angiogram N/A 12/31/2010    Procedure: LEFT AND RIGHT HEART CATHETERIZATION WITH Beatrix Fetters;  Surgeon: Laverda Page, MD;  Location: [facility] CATH LAB;  Service: Cardiovascular;  Laterality: N/A;  . Colonoscopy Left  04/11/2014    Procedure: COLONOSCOPY;  Surgeon: Arta Silence, MD;  Location: [facility] ENDOSCOPY;  Service: Endoscopy;  Laterality: Left;  . Colonoscopy N/A 04/20/2014    Procedure: COLONOSCOPY;  Surgeon: Lear Ng, MD;  Location: [facility] ENDOSCOPY;  Service: Endoscopy;  Laterality: N/A;    There were no vitals filed for this visit.  Visit Diagnosis:  Acquired lymphedema      Subjective Assessment - 07/26/14 1303    Subjective "I'm okay.  Nothing new."   Currently in Pain? Yes   Pain Score 8    Pain Location Leg   Pain Orientation Right;Left               LYMPHEDEMA/ONCOLOGY QUESTIONNAIRE - 07/26/14 1322    Head and Neck   4 cm superior to sternal notch around neck 42.7 cm  measured prior to manual lymph drainage today   6 cm superior to sternal notch around neck 43.5 cm   8 cm superior to sternal notch around neck 46.5 cm                  [facility] Adult PT Treatment/Exercise - 07/26/14 0001    Manual Therapy   Manual Therapy Edema management   Manual Lymphatic Drainage (MLD) In supine:  supraclavicular fossae, bilat. shoulder collectors, bilateral axillae, bilateral pectoral nodes; lateral, antero-lateral and anterior neck; posterolateral neck; chin, cheeks, and upper cheeks.  PT Education - 07/26/14 2052    Education provided Yes   Education Details about manufactured neck compression garments:  showed patient the [employer] sample we have and samples of materials like those used for some garments; showed notebook with mutliple options pictured and discussed some of these.     Person(s) Educated Patient   Methods Explanation;Demonstration   Comprehension Verbalized understanding                [place] [facility] Goals - 07/26/14 2058    CC Long Term Goal  #1   Status On-going   CC Long Term Goal  #2   Status On-going   CC Long Term Goal  #3   Status On-going   CC Long Term Goal  #4   Status On-going            Plan -  07/26/14 2054    Clinical Impression Statement Neck circumference measurements that were taken today are quite similar to those taken a couple of weeks ago; this was just patient's second treatment session in this episode of care.  Patient seemed to make a judgemenet about the type of neck compression garment he might consider, so he plans to check on that.   Pt will benefit from skilled therapeutic intervention in order to improve on the following deficits Increased edema;Decreased knowledge of use of DME;Pain;Decreased mobility;Decreased endurance   Rehab Potential Good   PT Frequency 2x / week   PT Duration 4 weeks   PT Treatment/Interventions Patient/family education;DME Instruction;Manual lymph drainage   PT Next Visit Plan continue manual lymph drainage and instruction in that   Recommended Other Services Pt. will probably go to [facility] to check out a manufactured neck compression garment that they carry.   Consulted and Agree with Plan of Care Patient        Problem List Patient Active Problem List   Diagnosis Date Noted  . Chronic pain syndrome   . History of GI diverticular bleed 04/18/2014  . Essential hypertension   . Acute blood loss anemia   . Lower GI bleed   . Gastrointestinal bleeding, lower   . Encounter for central line placement   . Hematochezia 04/10/2014  . Symptomatic anemia 04/08/2014  . GI bleed 04/08/2014  . 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 Mouth pain 07/18/2013  . Weight loss 07/18/2013  . Post-operative state 07/08/2013  . Post-op pain 07/08/2013  . Malignant neoplasm of tonsil 06/29/2013  . OSA (obstructive sleep apnea) 11/19/2011  . Angio-edema 09/17/2011  . hypotension  09/17/2011  . HTN (hypertension) 09/17/2011  . Gout 09/17/2011  . Tobacco abuse 09/17/2011    [name],[name] 07/26/2014, 8:59 PM  [place], [place], [address] Phone: [phone]   Fax:   [place], PT 07/26/2014 9:00 PM

## 2014-07-31 ENCOUNTER — Ambulatory Visit: Payer: Medicare Other | Admitting: Physical Therapy

## 2014-07-31 DIAGNOSIS — I890 Lymphedema, not elsewhere classified: Secondary | ICD-10-CM

## 2014-07-31 DIAGNOSIS — R5381 Other malaise: Secondary | ICD-10-CM

## 2014-07-31 NOTE — Therapy (Signed)
[place], [place], [address] Phone: [phone]   Fax:  [phone]  Physical Therapy Treatment  Patient Details  Name: Melvin Hudson MRN: [id number] Date of Birth: [birth year] Referring Provider:  Eppie Gibson, MD  Encounter Date: 07/31/2014      PT End of Session - 07/31/14 1259    Visit Number 4   Number of Visits 9   Date for PT Re-Evaluation 08/18/14   PT Start Time 4709   PT Stop Time 6283   PT Time Calculation (min) 48 min      Past Medical History  Diagnosis Date  . Hypertension   . Angioedema 09/17/11    tongue and lips  . Obesity   . Hx of gout   . S/P radiation therapy 07/26/2013-09/15/2013    Right tonsil/bilateral neck/ 7000 cGy  . Arthritis     "both of my legs and feet" (01/05/2014)  . OSA on CPAP   . 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 Knee arthroscopy Right ~ 1994  . Multiple tooth extractions  ~ 2008  . Multiple extractions with alveoloplasty N/A 07/08/2013    Procedure: Extraction of tooth #'s 22, 27 with alveoloplasty and bilateal mandibular facial exostoses reductions;  Surgeon: Lenn Cal, DDS;  [facility]: [facility];  Service: Oral Surgery;  Laterality: N/A;  . Colonoscopy w/ biopsies and polypectomy      benign  . Direct laryngoscopy  01/05/2014  . Direct laryngoscopy N/A 01/05/2014    Procedure: DIRECT LARYNGOSCOPY AND BIOPSY;  Surgeon: [name], MD;  Location: [place];  Service: ENT;  Laterality: N/A;  . Left and right heart catheterization with coronary/graft angiogram N/A 12/31/2010    Procedure: LEFT AND RIGHT HEART CATHETERIZATION WITH Beatrix Fetters;  Surgeon: Laverda Page, MD;  Location: [facility] CATH LAB;  Service: Cardiovascular;  Laterality: N/A;  . Colonoscopy Left 04/11/2014    Procedure: COLONOSCOPY;  Surgeon: Arta Silence, MD;  Location: [facility] ENDOSCOPY;  Service: Endoscopy;   Laterality: Left;  . Colonoscopy N/A 04/20/2014    Procedure: COLONOSCOPY;  Surgeon: Lear Ng, MD;  Location: [facility] ENDOSCOPY;  Service: Endoscopy;  Laterality: N/A;    There were no vitals filed for this visit.  Visit Diagnosis:  Acquired lymphedema  Lymphedema  Physical deconditioning      Subjective Assessment - 07/31/14 1259    Subjective pt states he is here for treatment of his neck swelling. He did not get a chance to go to [facility] to view garments. Note that pt does have Medicaid so will send infomration to [employer].    Pertinent History HTN that he says is not under good control consistently; has started a new medication (metoprolol).  Has put on weight since completing treatment.  Diagnosed with head and neck cancer and finished radiation therapy in mid-July 2015.  Has been treated here in the past (last fall) for lymphedema.    Currently in Pain? Yes  just in my legs...Marland KitchenMarland Kitchenthats always    Pain Score 7    Pain Location Leg   Pain Orientation Left;Right   Pain Descriptors / Indicators Aching   Pain Type Chronic pain   Pain Onset More than a month ago   Pain Frequency Constant   Aggravating Factors  sitting or standing    Pain Relieving Factors medicine  [facility] Adult PT Treatment/Exercise - 07/31/14 0001    Shoulder Exercises: Supine   Other Supine Exercises supine scapular series    Manual Therapy   Manual Therapy Edema management   Manual Lymphatic Drainage (MLD) In supine:  supraclavicular fossae, bilat. shoulder collectors, bilateral axillae, bilateral pectoral nodes; lateral, antero-lateral and anterior neck; posterolateral neck; chin, cheeks, and upper cheeks.   Neck Exercises: Stretches   Other Neck Stretches cervical rotation and lateral flexion                PT Education - 07/31/14 1344    Education provided Yes   Education Details neck rage of motion and supine scapula series for UE  range of motion and strength    Person(s) Educated Patient   Methods Explanation;Demonstration;Handout   Comprehension Verbalized understanding;Need further instruction                [facility] Goals - 07/26/14 2058    CC Long Term Goal  #1   Status On-going   CC Long Term Goal  #2   Status On-going   CC Long Term Goal  #3   Status On-going   CC Long Term Goal  #4   Status On-going            Plan - 07/31/14 1347    Clinical Impression Statement educated in home exercise program for cervical , scapular and shoulder range of motion with red theraband resistance for shoulders so making progress toward home exercise goal.  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 He is progressing toward goals Demographics sent to her.    PT Next Visit Plan continue manual lymph drainage and instruction in that  Review home exercise instructed on 07/31/2014         Problem List Patient Active Problem List   Diagnosis Date Noted  . Chronic pain syndrome   . History of GI diverticular bleed 04/18/2014  . Essential hypertension   . Acute blood loss anemia   . Lower GI bleed   . Gastrointestinal bleeding, lower   . Encounter for central line placement   . Hematochezia 04/10/2014  . Symptomatic anemia 04/08/2014  . GI bleed 04/08/2014  . 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 Mouth pain 07/18/2013  . Weight loss 07/18/2013  . Post-operative state 07/08/2013  . Post-op pain 07/08/2013  . Malignant neoplasm of tonsil 06/29/2013  . OSA (obstructive sleep apnea) 11/19/2011  . Angio-edema 09/17/2011  . hypotension  09/17/2011  . HTN (hypertension) 09/17/2011  . Gout 09/17/2011  . Tobacco abuse 09/17/2011   Maudry Diego, PT 07/31/2014 1:52 PM   [place], [place], [address] Phone: [phone]   Fax:  [phone]

## 2014-07-31 NOTE — Patient Instructions (Signed)
   Copyright  VHI. All rights reserved.   Over Head Pull: Narrow Grip     On back, knees bent, feet flat, band across thighs, elbows straight but relaxed. Pull hands apart (start). Keeping elbows straight, bring arms up and over head, hands toward floor. Keep pull steady on band. Hold momentarily. Return slowly, keeping pull steady, back to start. Repeat _5-10__ times. Band color ___red___   Side Pull: Double Arm   On back, knees bent, feet flat. Arms perpendicular to body, shoulder level, elbows straight but relaxed. Pull arms out to sides, elbows straight. Resistance band comes across collarbones, hands toward floor. Hold momentarily. Slowly return to starting position. Repeat _5-10__ times. Band color __red___   Elmer Picker   On back, knees bent, feet flat, left hand on left hip, right hand above left. Pull right arm DIAGONALLY (hip to shoulder) across chest. Bring right arm along head toward floor. Hold momentarily. Slowly return to starting position. Repeat 5-10___ times. Do with left arm. Band color _red_____   Shoulder Rotation: Double Arm   On back, knees bent, feet flat, elbows tucked at sides, bent 90, hands palms up. Pull hands apart and down toward floor, keeping elbows near sides. Hold momentarily. Slowly return to starting position. Repeat 5-10___ times. Band color __red____    Neck range of motion:  Sit as tall as you can with good posture; Turn you head to one side towards looking over your shoulder. Breathe in and out. Return to center.  Repeat to the other side.  5 times to each direction  Look straight ahead.  Bring ear towards shoulder. Hold for one breath Do on other side .5 times to each direction

## 2014-08-02 ENCOUNTER — Ambulatory Visit: Payer: Medicare Other

## 2014-08-02 DIAGNOSIS — I890 Lymphedema, not elsewhere classified: Secondary | ICD-10-CM | POA: Diagnosis not present

## 2014-08-02 DIAGNOSIS — R5381 Other malaise: Secondary | ICD-10-CM

## 2014-08-02 NOTE — Therapy (Signed)
[place], [place], [address] Phone: [phone]   Fax:  [phone]  Physical Therapy Treatment  Patient Details  Name: Melvin Hudson MRN: [id number] Date of Birth: 09-07-1955 Referring Provider:  Eppie Gibson, MD  Encounter Date: 08/02/2014      PT End of Session - 08/02/14 1434    Visit Number 5   Number of Visits 9   Date for PT Re-Evaluation 08/18/14   PT Start Time 5170   PT Stop Time 1433   PT Time Calculation (min) 40 min   Activity Tolerance Patient tolerated treatment well   Behavior During Therapy [facility] for tasks assessed/performed      Past Medical History  Diagnosis Date  . Hypertension   . Angioedema 09/17/11    tongue and lips  . Obesity   . Hx of gout   . S/P radiation therapy 07/26/2013-09/15/2013    Right tonsil/bilateral neck/ 7000 cGy  . Arthritis     "both of my legs and feet" (01/05/2014)  . OSA on CPAP   . 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 Knee arthroscopy Right ~ 1994  . Multiple tooth extractions  ~ 2008  . Multiple extractions with alveoloplasty N/A 07/08/2013    Procedure: Extraction of tooth #'s 22, 27 with alveoloplasty and bilateal mandibular facial exostoses reductions;  Surgeon: Lenn Cal, DDS;  Location: [facility];  Service: Oral Surgery;  Laterality: N/A;  . Colonoscopy w/ biopsies and polypectomy      benign  . Direct laryngoscopy  01/05/2014  . Direct laryngoscopy N/A 01/05/2014    Procedure: DIRECT LARYNGOSCOPY AND BIOPSY;  Surgeon: [name], MD;  Location: [place];  Service: ENT;  Laterality: N/A;  . Left and right heart catheterization with coronary/graft angiogram N/A 12/31/2010    Procedure: LEFT AND RIGHT HEART CATHETERIZATION WITH Beatrix Fetters;  Surgeon: Laverda Page, MD;  Location: [facility] CATH LAB;  Service: Cardiovascular;  Laterality: N/A;  . Colonoscopy Left  04/11/2014    Procedure: COLONOSCOPY;  Surgeon: Arta Silence, MD;  Location: [facility] ENDOSCOPY;  Service: Endoscopy;  Laterality: Left;  . Colonoscopy N/A 04/20/2014    Procedure: COLONOSCOPY;  Surgeon: Lear Ng, MD;  Location: [facility] ENDOSCOPY;  Service: Endoscopy;  Laterality: N/A;    There were no vitals filed for this visit.  Visit Diagnosis:  Acquired lymphedema  Lymphedema  Physical deconditioning      Subjective Assessment - 08/02/14 1359    Subjective Some of my meds changed. Getting measured for my compression garment today.    Currently in Pain? Yes  Just in my legs   Pain Score 7    Pain Location Leg   Pain Orientation Left;Right   Pain Descriptors / Indicators --  Joint pain   Pain Type Chronic pain   Aggravating Factors  sitting or standing   Pain Relieving Factors nothing                         [facility] Adult PT Treatment/Exercise - 08/02/14 0001    Manual Therapy   Manual Lymphatic Drainage (MLD) In supine:  supraclavicular fossae, bilat. shoulder collectors, bilateral axillae, bilateral pectoral nodes; lateral, antero-lateral and anterior neck; posterolateral neck; chin, cheeks, and upper cheeks.                        [facility]  Clinic Goals - 07/26/14 2058    CC Long Term Goal  #1   Status On-going   CC Long Term Goal  #2   Status On-going   CC Long Term Goal  #3   Status On-going   CC Long Term Goal  #4   Status On-going            Plan - 08/02/14 1437    Clinical Impression Statement Pt tolerated treatment well today and reports is doing new HEP. Pt will be fitted for compression garment today here at our clinic.    Pt will benefit from skilled therapeutic intervention in order to improve on the following deficits Increased edema;Decreased knowledge of use of DME;Pain;Decreased mobility;Decreased endurance   Rehab Potential Good   PT Frequency 2x / week   PT Duration 4 weeks   PT Treatment/Interventions  Patient/family education;DME Instruction;Manual lymph drainage   PT Next Visit Plan continue manual lymph drainage and instruction in that  Review home exercise instructed on 07/31/2014    Consulted and Agree with Plan of Care Patient        Problem List Patient Active Problem List   Diagnosis Date Noted  . Chronic pain syndrome   . History of GI diverticular bleed 04/18/2014  . Essential hypertension   . Acute blood loss anemia   . Lower GI bleed   . Gastrointestinal bleeding, lower   . Encounter for central line placement   . Hematochezia 04/10/2014  . Symptomatic anemia 04/08/2014  . GI bleed 04/08/2014  . 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 Mouth pain 07/18/2013  . Weight loss 07/18/2013  . Post-operative state 07/08/2013  . Post-op pain 07/08/2013  . Malignant neoplasm of tonsil 06/29/2013  . OSA (obstructive sleep apnea) 11/19/2011  . Angio-edema 09/17/2011  . hypotension  09/17/2011  . HTN (hypertension) 09/17/2011  . Gout 09/17/2011  . Tobacco abuse 09/17/2011    [name], PTA 08/02/2014, 2:38 PM  [place], [place], [address] Phone: [phone]   Fax:  [phone]

## 2014-08-07 ENCOUNTER — Ambulatory Visit: Payer: Medicare Other

## 2014-08-09 ENCOUNTER — Ambulatory Visit: Payer: Medicare Other | Admitting: Physical Therapy

## 2014-08-09 VITALS — BP 134/88

## 2014-08-09 DIAGNOSIS — I890 Lymphedema, not elsewhere classified: Secondary | ICD-10-CM

## 2014-08-09 NOTE — Therapy (Signed)
[place], [place], [address] Phone: [phone]   Fax:  [phone]  Physical Therapy Treatment  Patient Details  Name: Melvin Hudson MRN: [id number] Date of Birth: Jan 14, 1956 Referring Provider:  Eppie Gibson, MD  Encounter Date: 08/09/2014      PT End of Session - 08/09/14 1343    Visit Number 6   Number of Visits 9   Date for PT Re-Evaluation 08/18/14   PT Start Time 6761   PT Stop Time 1342   PT Time Calculation (min) 39 min   Activity Tolerance Patient tolerated treatment well   Behavior During Therapy [facility] for tasks assessed/performed      Past Medical History  Diagnosis Date  . Hypertension   . Angioedema 09/17/11    tongue and lips  . Obesity   . Hx of gout   . S/P radiation therapy 07/26/2013-09/15/2013    Right tonsil/bilateral neck/ 7000 cGy  . Arthritis     "both of my legs and feet" (01/05/2014)  . OSA on CPAP   . 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 Knee arthroscopy Right ~ 1994  . Multiple tooth extractions  ~ 2008  . Multiple extractions with alveoloplasty N/A 07/08/2013    Procedure: Extraction of tooth #'s 22, 27 with alveoloplasty and bilateal mandibular facial exostoses reductions;  Surgeon: Lenn Cal, DDS;  [facility]: [facility];  Service: Oral Surgery;  Laterality: N/A;  . Colonoscopy w/ biopsies and polypectomy      benign  . Direct laryngoscopy  01/05/2014  . Direct laryngoscopy N/A 01/05/2014    Procedure: DIRECT LARYNGOSCOPY AND BIOPSY;  Surgeon: [name], MD;  Location: [place];  Service: ENT;  Laterality: N/A;  . Left and right heart catheterization with coronary/graft angiogram N/A 12/31/2010    Procedure: LEFT AND RIGHT HEART CATHETERIZATION WITH Beatrix Fetters;  Surgeon: Laverda Page, MD;  Location[facility] CATH LAB;  Service: Cardiovascular;  Laterality: N/A;  . Colonoscopy Left  04/11/2014    Procedure: COLONOSCOPY;  Surgeon: Arta Silence, MD;  Location: [facility] ENDOSCOPY;  Service: Endoscopy;  Laterality: Left;  . Colonoscopy N/A 04/20/2014    Procedure: COLONOSCOPY;  Surgeon: Lear Ng, MD;  Location: [facility] ENDOSCOPY;  Service: Endoscopy;  Laterality: N/A;    Filed Vitals:   08/09/14 1304  BP: 134/88    Visit Diagnosis:  Acquired lymphedema      Subjective Assessment - 08/09/14 1304    Subjective "I just don't feel good today."   Currently in Pain? Yes   Pain Score 7    Pain Location Leg   Pain Orientation Right;Left                         [facility] Adult PT Treatment/Exercise - 08/09/14 0001    Manual Therapy   Manual Lymphatic Drainage (MLD) In supine:  supraclavicular fossae, bilat. shoulder collectors, bilateral axillae, bilateral pectoral nodes; lateral, antero-lateral and anterior neck; posterolateral neck; chin, cheeks, and upper cheeks; also upper chest bilat. and superficial and deep abdomen.                        [facility] Goals - 07/26/14 2058    CC Long Term Goal  #1   Status On-going   CC Long Term Goal  #2   Status On-going   CC Long Term  Goal  #3   Status On-going   CC Long Term Goal  #4   Status On-going            Plan - 08/09/14 1343    Clinical Impression Statement Patient not feeling well but felt a little better after session; blood pressure check at beginning of session was fine.  Patient how has been measured for garment and it should arrive in a couple of weeks.  He reports feeling confident about doing his own self-manual lymph drainage.   Pt will benefit from skilled therapeutic intervention in order to improve on the following deficits Increased edema;Decreased knowledge of use of DME;Pain;Decreased mobility;Decreased endurance   Rehab Potential Good   PT Frequency 2x / week   PT Duration 4 weeks   PT Treatment/Interventions Manual lymph drainage   PT Next Visit Plan  Remeasure and check goals.  Continue manual lymph drainage.   Consulted and Agree with Plan of Care Patient        Problem List Patient Active Problem List   Diagnosis Date Noted  . Chronic pain syndrome   . History of GI diverticular bleed 04/18/2014  . Essential hypertension   . Acute blood loss anemia   . Lower GI bleed   . Gastrointestinal bleeding, lower   . Encounter for central line placement   . Hematochezia 04/10/2014  . Symptomatic anemia 04/08/2014  . GI bleed 04/08/2014  . 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 Mouth pain 07/18/2013  . Weight loss 07/18/2013  . Post-operative state 07/08/2013  . Post-op pain 07/08/2013  . Malignant neoplasm of tonsil 06/29/2013  . OSA (obstructive sleep apnea) 11/19/2011  . Angio-edema 09/17/2011  . hypotension  09/17/2011  . HTN (hypertension) 09/17/2011  . Gout 09/17/2011  . Tobacco abuse 09/17/2011    [name],[name] 08/09/2014, 1:46 PM  [place], [place], [address] Phone: [phone]   Fax:  [place], PT 08/09/2014 1:46 PM

## 2014-08-14 ENCOUNTER — Ambulatory Visit: Payer: Medicare Other | Admitting: Physical Therapy

## 2014-08-14 DIAGNOSIS — I890 Lymphedema, not elsewhere classified: Secondary | ICD-10-CM | POA: Diagnosis not present

## 2014-08-14 NOTE — Therapy (Signed)
[place], [place], [address] Phone: [phone]   Fax:  [phone]  Physical Therapy Treatment  Patient Details  Name: Melvin Hudson MRN: [id number] Date of Birth: 02-18-1955 Referring Provider:  Eppie Gibson, MD  Encounter Date: 08/14/2014      PT End of Session - 08/14/14 1445    Visit Number 7   Number of Visits 9   Date for PT Re-Evaluation 08/18/14   PT Start Time 8786   PT Stop Time 1344   PT Time Calculation (min) 46 min   Activity Tolerance Patient tolerated treatment well   Behavior During Therapy [facility] for tasks assessed/performed      Past Medical History  Diagnosis Date  . Hypertension   . Angioedema 09/17/11    tongue and lips  . Obesity   . Hx of gout   . S/P radiation therapy 07/26/2013-09/15/2013    Right tonsil/bilateral neck/ 7000 cGy  . Arthritis     "both of my legs and feet" (01/05/2014)  . OSA on CPAP   . 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 Knee arthroscopy Right ~ 1994  . Multiple tooth extractions  ~ 2008  . Multiple extractions with alveoloplasty N/A 07/08/2013    Procedure: Extraction of tooth #'s 22, 27 with alveoloplasty and bilateal mandibular facial exostoses reductions;  Surgeon: Lenn Cal, DDS;  [facility];  Service: Oral Surgery;  Laterality: N/A;  . Colonoscopy w/ biopsies and polypectomy      benign  . Direct laryngoscopy  01/05/2014  . Direct laryngoscopy N/A 01/05/2014    Procedure: DIRECT LARYNGOSCOPY AND BIOPSY;  Surgeon: [name], MD;  Location: [place];  Service: ENT;  Laterality: N/A;  . Left and right heart catheterization with coronary/graft angiogram N/A 12/31/2010    Procedure: LEFT AND RIGHT HEART CATHETERIZATION WITH Beatrix Fetters;  Surgeon: Laverda Page, MD;  Location: [facility] CATH LAB;  Service: Cardiovascular;  Laterality: N/A;  . Colonoscopy Left  04/11/2014    Procedure: COLONOSCOPY;  Surgeon: Arta Silence, MD;  Location: [facility] ENDOSCOPY;  Service: Endoscopy;  Laterality: Left;  . Colonoscopy N/A 04/20/2014    Procedure: COLONOSCOPY;  Surgeon: Lear Ng, MD;  Location: [facility] ENDOSCOPY;  Service: Endoscopy;  Laterality: N/A;    There were no vitals filed for this visit.  Visit Diagnosis:  Acquired lymphedema      Subjective Assessment - 08/14/14 1301    Subjective Feeling better than last time.  Sister who's had a stroke is moving down here.   Currently in Pain? Yes   Pain Score 7    Pain Location Leg   Pain Orientation Right;Left               LYMPHEDEMA/ONCOLOGY QUESTIONNAIRE - 08/14/14 1338    Head and Neck   4 cm superior to sternal notch around neck 41.5 cm   6 cm superior to sternal notch around neck 43.8 cm   8 cm superior to sternal notch around neck 46 cm                  [facility] Adult PT Treatment/Exercise - 08/14/14 0001    Manual Therapy   Manual Lymphatic Drainage (MLD) In supine:  supraclavicular fossae, bilat. shoulder collectors, bilateral axillae, bilateral pectoral nodes; lateral, antero-lateral and anterior neck; posterolateral neck; chin, cheeks, and upper cheeks; also upper chest bilat. and superficial and deep abdomen.  [facility] Goals - 08/14/14 1447    CC Long Term Goal  #2   Baseline 46 cm. on 08/14/14 (decreased 0.5 cm.)   Status On-going            Plan - 08/14/14 1445    Clinical Impression Statement Pt. feeling better today.  Measurements at 4 cm. and 8 cm. superior to sternal notch have decreased, whereas at 6 cm. superior, measurement is slightly larger.   Pt will benefit from skilled therapeutic intervention in order to improve on the following deficits Increased edema;Decreased knowledge of use of DME;Pain;Decreased mobility;Decreased endurance   Rehab Potential Good   PT Frequency 2x / week   PT Duration 4 weeks    PT Treatment/Interventions Manual lymph drainage   PT Next Visit Plan Continue manual lymph drainage for 1-3 more sessions.  Garment should be coming soon.  Check on home exercise program to be able to check goals; check remaining goals.   Consulted and Agree with Plan of Care Patient        Problem List Patient Active Problem List   Diagnosis Date Noted  . Chronic pain syndrome   . History of GI diverticular bleed 04/18/2014  . Essential hypertension   . Acute blood loss anemia   . Lower GI bleed   . Gastrointestinal bleeding, lower   . Encounter for central line placement   . Hematochezia 04/10/2014  . Symptomatic anemia 04/08/2014  . GI bleed 04/08/2014  . 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 Mouth pain 07/18/2013  . Weight loss 07/18/2013  . Post-operative state 07/08/2013  . Post-op pain 07/08/2013  . Malignant neoplasm of tonsil 06/29/2013  . OSA (obstructive sleep apnea) 11/19/2011  . Angio-edema 09/17/2011  . hypotension  09/17/2011  . HTN (hypertension) 09/17/2011  . Gout 09/17/2011  . Tobacco abuse 09/17/2011    [name],[name] 08/14/2014, 2:49 PM  [place], [place], [address] Phone: [phone]   Fax:  [place], PT 08/14/2014 2:50 PM

## 2014-08-16 ENCOUNTER — Ambulatory Visit: Payer: Medicare Other | Admitting: Physical Therapy

## 2014-08-16 DIAGNOSIS — I890 Lymphedema, not elsewhere classified: Secondary | ICD-10-CM

## 2014-08-16 NOTE — Therapy (Signed)
Melvin Hudson, [place], [address] Phone: [phone]   Fax:  [phone]  Physical Therapy Treatment  Patient Details  Name: Melvin Hudson MRN: [id number] Date of Birth: 1956-01-24 Referring Provider:  Eppie Gibson, MD  Encounter Date: 08/16/2014      PT End of Session - 08/16/14 2035    Visit Number 8   Number of Visits 8   PT Start Time 1300   PT Stop Time 1344   PT Time Calculation (min) 44 min   Activity Tolerance Patient tolerated treatment well   Behavior During Therapy [facility] for tasks assessed/performed      Past Medical History  Diagnosis Date  . Hypertension   . Angioedema 09/17/11    tongue and lips  . Obesity   . Hx of gout   . S/P radiation therapy 07/26/2013-09/15/2013    Right tonsil/bilateral neck/ 7000 cGy  . Arthritis     "both of my legs and feet" (01/05/2014)  . OSA on CPAP   . 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 Knee arthroscopy Right ~ 1994  . Multiple tooth extractions  ~ 2008  . Multiple extractions with alveoloplasty N/A 07/08/2013    Procedure: Extraction of tooth #'s 22, 27 with alveoloplasty and bilateal mandibular facial exostoses reductions;  Surgeon: Melvin Hudson, DDS;  Location: [facility];  Service: Oral Surgery;  Laterality: N/A;  . Colonoscopy w/ biopsies and polypectomy      benign  . Direct laryngoscopy  01/05/2014  . Direct laryngoscopy N/A 01/05/2014    Procedure: DIRECT LARYNGOSCOPY AND BIOPSY;  Surgeon: Melvin Gala, MD;  Location: [place];  Service: ENT;  Laterality: N/A;  . Left and right heart catheterization with coronary/graft angiogram N/A 12/31/2010    Procedure: LEFT AND RIGHT HEART CATHETERIZATION WITH Melvin Hudson;  Surgeon: Melvin Page, MD;  Location: [facility] CATH LAB;  Service: Cardiovascular;  Laterality: N/A;  . Colonoscopy Left 04/11/2014    Procedure: COLONOSCOPY;   Surgeon: Melvin Silence, MD;  Location: [facility] ENDOSCOPY;  Service: Endoscopy;  Laterality: Left;  . Colonoscopy N/A 04/20/2014    Procedure: COLONOSCOPY;  Surgeon: Melvin Ng, MD;  Location: [facility] ENDOSCOPY;  Service: Endoscopy;  Laterality: N/A;    There were no vitals filed for this visit.  Visit Diagnosis:  Acquired lymphedema      Subjective Assessment - 08/16/14 1301    Subjective Sister should arrive tonight.  Nothing else new.   Currently in Pain? Yes   Pain Score 7    Pain Location Leg   Pain Orientation Right;Left   Aggravating Factors  stepping off van felt like knee wanted to give way                         [facility] Adult PT Treatment/Exercise - 08/16/14 0001    Manual Therapy   Manual Therapy Edema management   Manual Lymphatic Drainage (MLD) In supine:  supraclavicular fossae, bilat. shoulder collectors, bilateral axillae, bilateral pectoral nodes; lateral, antero-lateral and anterior neck; posterolateral neck; chin, cheeks, and upper cheeks; also upper chest bilat. and superficial and deep abdomen.                PT Education - 08/16/14 1307    Education provided Yes   Education Details infection risk, signs, prevention, treatment   Person(s) Educated Patient   Methods Explanation   Comprehension  Verbalized understanding                [facility] Goals - Aug 24, 2014 04-21-2035    CC Long Term Goal  #1   Status Achieved   CC Long Term Goal  #2   Status Partially Met   CC Long Term Goal  #3   Status Achieved   CC Long Term Goal  #4   Status Achieved  for sharp neck pain frequency; still has leg pain            Plan - 2014-08-24 04-20-34    Clinical Impression Statement Patient is feeling ready for discharge.  See goals for current status.   Pt will benefit from skilled therapeutic intervention in order to improve on the following deficits Increased edema;Decreased knowledge of use of DME;Pain;Decreased mobility;Decreased  endurance   PT Treatment/Interventions Patient/family education;Manual lymph drainage   PT Next Visit Plan None; discharge today.   Consulted and Agree with Plan of Care Patient          G-Codes - 08/24/14 April 20, 2036    Functional Assessment Tool Used clinical judgement   Functional Limitation Self care   Self Care Goal Status [phone]) At least 1 percent but less than 20 percent impaired, limited or restricted   Self Care Discharge Status [phone]) At least 1 percent but less than 20 percent impaired, limited or restricted      Problem List Patient Active Problem List   Diagnosis Date Noted  . Chronic pain syndrome   . History of GI diverticular bleed 04/18/2014  . Essential hypertension   . Acute blood loss anemia   . Lower GI bleed   . Gastrointestinal bleeding, lower   . Encounter for central line placement   . Hematochezia 04/10/2014  . Symptomatic anemia 04/08/2014  . GI bleed 04/08/2014  . 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 Mouth pain 07/18/2013  . Weight loss 07/18/2013  . Post-operative state 07/08/2013  . Post-op pain 07/08/2013  . Malignant neoplasm of tonsil 06/29/2013  . OSA (obstructive sleep apnea) 11/19/2011  . Angio-edema 09/17/2011  . hypotension  09/17/2011  . HTN (hypertension) 09/17/2011  . Gout 09/17/2011  . Tobacco abuse 09/17/2011    [name],Melvin Hudson 2014/08/24, 8:41 PM  [place], [place], [address] Phone: [phone]   Fax:  [phone]  PHYSICAL THERAPY DISCHARGE SUMMARY  Visits from Start of Care: 8  Current functional level related to goals / functional outcomes: Three of four goals were met:  Patient is independent in self-care, knowledgeable about lymphedema risk reduction and about garments available, about neck exercises; also reports his neck pain frequency has decreased more than 50%.   Remaining deficits: Neck circumference at level chosen for goal reduced by  0.5 cm but not by 1 cm as was hoped.  Swelling will continue to require management.   Education / Equipment: Self manual lymph drainage, manufactured garment options, lymphedema risk reduction (infection prevention, signs).  Plan: Patient agrees to discharge.  Patient goals were met. Patient is being discharged due to meeting the stated rehab goals.  ?????       Serafina Royals, PT 2014/08/24 8:46 PM

## 2014-10-10 ENCOUNTER — Encounter (HOSPITAL_COMMUNITY): Payer: Self-pay | Admitting: Dentistry

## 2014-10-10 ENCOUNTER — Ambulatory Visit (HOSPITAL_COMMUNITY): Payer: Medicare Other | Admitting: Dentistry

## 2014-10-10 VITALS — BP 130/71 | HR 58 | Temp 97.8°F

## 2014-10-10 DIAGNOSIS — C099 Malignant neoplasm of tonsil, unspecified: Secondary | ICD-10-CM

## 2014-10-10 DIAGNOSIS — Z923 Personal history of irradiation: Secondary | ICD-10-CM

## 2014-10-10 DIAGNOSIS — R682 Dry mouth, unspecified: Secondary | ICD-10-CM

## 2014-10-10 DIAGNOSIS — K08109 Complete loss of teeth, unspecified cause, unspecified class: Secondary | ICD-10-CM

## 2014-10-10 DIAGNOSIS — K000 Anodontia: Secondary | ICD-10-CM

## 2014-10-10 DIAGNOSIS — Z463 Encounter for fitting and adjustment of dental prosthetic device: Secondary | ICD-10-CM

## 2014-10-10 DIAGNOSIS — K117 Disturbances of salivary secretion: Secondary | ICD-10-CM

## 2014-10-10 NOTE — Patient Instructions (Signed)
Return to clinic as scheduled for continued upper and lower complete denture fabrication. Dr. Jonquil Stubbe 

## 2014-10-10 NOTE — Progress Notes (Signed)
10/10/2014  Patient:            Melvin Hudson Date of Birth:  1956/02/17 MRN:                [id number]   BP 130/71 mmHg  Pulse 58  Temp(Src) 97.8 F (36.6 C) (Oral)  Past Medical History  Diagnosis Date  . Hypertension   . Angioedema 09/17/11    tongue and lips  . Obesity   . Hx of gout   . S/P radiation therapy 07/26/2013-09/15/2013    Right tonsil/bilateral neck/ 7000 cGy  . Arthritis     "both of my legs and feet" (01/05/2014)  . OSA on CPAP   . 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 Advil [Ibuprofen]     Feels "jittery"  . Azilsartan Swelling    Avoid ARB and ACEI per MD   Past Surgical History  Procedure Laterality Date  . Knee arthroscopy Right ~ 1994  . Multiple tooth extractions  ~ 2008  . Multiple extractions with alveoloplasty N/A 07/08/2013    Procedure: Extraction of tooth #'s 22, 27 with alveoloplasty and bilateal mandibular facial exostoses reductions;  Surgeon: Lenn Cal, DDS;  Location: [facility];  Service: Oral Surgery;  Laterality: N/A;  . Colonoscopy w/ biopsies and polypectomy      benign  . Direct laryngoscopy  01/05/2014  . Direct laryngoscopy N/A 01/05/2014    Procedure: DIRECT LARYNGOSCOPY AND BIOPSY;  Surgeon: [name], MD;  Location: [place];  Service: ENT;  Laterality: N/A;  . Left and right heart catheterization with coronary/graft angiogram N/A 12/31/2010    Procedure: LEFT AND RIGHT HEART CATHETERIZATION WITH Beatrix Fetters;  Surgeon: Laverda Page, MD;  Location: [facility] CATH LAB;  Service: Cardiovascular;  Laterality: N/A;  . Colonoscopy Left 04/11/2014    Procedure: COLONOSCOPY;  Surgeon: Arta Silence, MD;  Location: [facility] ENDOSCOPY;  Service: Endoscopy;  Laterality: Left;  . Colonoscopy N/A 04/20/2014    Procedure: COLONOSCOPY;  Surgeon: Lear Ng, MD;  Location: [facility] ENDOSCOPY;  Service: Endoscopy;  Laterality: N/A;   Current Outpatient Prescriptions  Medication Sig Dispense  Refill  . Cholecalciferol (VITAMIN D-3 PO) Take 1,000 Units by mouth daily.    . diphenhydrAMINE (BENADRYL) 25 MG tablet Take 1 tablet (25 mg total) by mouth every 6 (six) hours. 20 tablet 0  . metoprolol (LOPRESSOR) 50 MG tablet Take 50 mg by mouth daily at 6 (six) AM.    . metoprolol succinate (TOPROL-XL) 50 MG 24 hr tablet Take 50 mg by mouth daily. Take with or immediately following a meal.    . NIFEdipine (ADALAT CC) 90 MG 24 hr tablet Take 90 mg by mouth daily.    [name] olmesartan (BENICAR) 40 MG tablet Take 40 mg by mouth daily.    [name] omeprazole (PRILOSEC) 20 MG capsule   6  . oxyCODONE-acetaminophen (PERCOCET) 10-325 MG per tablet Take 1 tablet by mouth every 6 (six) hours as needed for pain. 120 tablet 0  . polyethylene glycol (MIRALAX / GLYCOLAX) packet Take 17 g by mouth daily. 14 each 0  . spironolactone (ALDACTONE) 25 MG tablet Take 50 mg by mouth 2 (two) times daily.     . tadalafil (CIALIS) 20 MG tablet Take 20 mg by mouth daily as needed for erectile dysfunction.      No current facility-administered medications for this visit.   Melvin Hudson is a 59 year old male with history of  squamous cell carcinoma of the right tonsil. Patient was seen as part of a medically necessary pre-chemoradiation therapy dental protocol examination on 07/06/2013. Patient underwent extractions with alveoloplasty and pre-prosthetic surgery as indicated in the operating room on 07/08/2013. The patient then underwent radiation therapy from 07/26/2013 through 09/15/2013. Upper and lower complete denture fabrication was started on 12/20/2013. During the appointment for the final impressions on 12/27/2013 a soft tissue defect was noted in the mandibular right retromolar area. Patient was then referred to Dr. Constance Holster who performed a direct laryngoscopy and biopsy on 01/05/2014. Upper and lower complete denture fabrication was discontinued at that time. Patient was then seen by Dr. Constance Holster for a follow-up on 10/03/2014  and was cleared to proceed with the upper and lower complete denture fabrication. The patient now presents for an oral examination and continued border molding and final impressions as indicated.  Dental exam: General: The patient is a well-developed, well-nourished male in no acute distress. Head and neck: There is no palpable submandibular lymphadenopathy. The patient denies acute TMJ symptoms. Intraoral exam: The patient is edentulous. The patient has xerostomia. The previous soft tissue defect in the area of the right retromolar trigone area has healed in well. Dentition: Patient is edentulous. There is atrophy of the edentulous alveolar ridges.  Prosthodontics: Patient has a good prognosis for upper and lower complete dentures.  ASSESSMENT: 1. Edentulous 2. Atrophy of the edentulous alveolar ridges 3. Xerostomia 4 . Defect in retromolar area of the lower right quadrant has healed in well.   PLAN: Proceed with upper lower complete denture fabrication-topday  Procedure:  Upper and lower border molding and final impressions in [place]. Patient tolerated procedure well. To [employer] for custom baseplates with rims.  Return to clinic for upper and lower complete denture jaw relations.  Lenn Cal, DDS

## 2014-10-18 ENCOUNTER — Ambulatory Visit (HOSPITAL_COMMUNITY): Payer: Medicaid - Dental | Admitting: Dentistry

## 2014-10-18 ENCOUNTER — Encounter (HOSPITAL_COMMUNITY): Payer: Self-pay | Admitting: Dentistry

## 2014-10-18 VITALS — BP 122/78 | HR 62 | Temp 98.3°F

## 2014-10-18 DIAGNOSIS — R682 Dry mouth, unspecified: Secondary | ICD-10-CM

## 2014-10-18 DIAGNOSIS — K117 Disturbances of salivary secretion: Secondary | ICD-10-CM

## 2014-10-18 DIAGNOSIS — K000 Anodontia: Secondary | ICD-10-CM

## 2014-10-18 DIAGNOSIS — Z923 Personal history of irradiation: Secondary | ICD-10-CM

## 2014-10-18 DIAGNOSIS — K08109 Complete loss of teeth, unspecified cause, unspecified class: Secondary | ICD-10-CM

## 2014-10-18 DIAGNOSIS — Z463 Encounter for fitting and adjustment of dental prosthetic device: Secondary | ICD-10-CM

## 2014-10-18 DIAGNOSIS — C099 Malignant neoplasm of tonsil, unspecified: Secondary | ICD-10-CM

## 2014-10-18 NOTE — Progress Notes (Signed)
10/18/2014  Patient Name:   Melvin Hudson Date of Birth:   [birth year] Medical Record Number: [id number]  BP 122/78 mmHg  Pulse 62  Temp(Src) 98.3 F (36.8 C) (Oral)  Melvin Hudson presents for continued denture fabrication.  Procedure:  Upper and lower denture Jaw relations with aluwax bite registration. Patient agrees to tooth selection of 22 H, P, and 10 degree posteriors to match with Portrait A2 shade. Patient tolerated procedure well. RTC for denture wax try in.   Lenn Cal, DDS

## 2014-10-18 NOTE — Patient Instructions (Signed)
Return to clinic as scheduled for continued upper and lower complete denture fabrication. Dr. Athene Schuhmacher 

## 2014-10-30 ENCOUNTER — Encounter (HOSPITAL_COMMUNITY): Payer: Self-pay | Admitting: Dentistry

## 2014-10-30 ENCOUNTER — Ambulatory Visit (HOSPITAL_COMMUNITY): Payer: Medicaid - Dental | Admitting: Dentistry

## 2014-10-30 VITALS — BP 130/76 | HR 79 | Temp 98.1°F

## 2014-10-30 DIAGNOSIS — K000 Anodontia: Secondary | ICD-10-CM

## 2014-10-30 DIAGNOSIS — K08109 Complete loss of teeth, unspecified cause, unspecified class: Secondary | ICD-10-CM

## 2014-10-30 DIAGNOSIS — R682 Dry mouth, unspecified: Secondary | ICD-10-CM

## 2014-10-30 DIAGNOSIS — Z923 Personal history of irradiation: Secondary | ICD-10-CM

## 2014-10-30 DIAGNOSIS — C099 Malignant neoplasm of tonsil, unspecified: Secondary | ICD-10-CM

## 2014-10-30 DIAGNOSIS — Z463 Encounter for fitting and adjustment of dental prosthetic device: Secondary | ICD-10-CM

## 2014-10-30 DIAGNOSIS — K117 Disturbances of salivary secretion: Secondary | ICD-10-CM

## 2014-10-30 NOTE — Progress Notes (Signed)
10/30/2014  Patient Name:   Melvin Hudson Date of Birth:   04/08/1955 Medical Record Number: [id number]   BP 130/76 mmHg  Pulse 79  Temp(Src) 98.1 F (36.7 C) (Oral)  [name] presents for continued upper and lower denture fabrication.  Procedure:  Upper and lower denture wax tryin. Patient accepts esthetics, phonetics, fit and function. Patient agrees to process "as is" in 50:50  [place] 199. Patient to RTC for  upper and lower denture insertion.  Lenn Cal, DDS

## 2014-10-30 NOTE — Patient Instructions (Signed)
Return to clinic as scheduled for continued upper and lower complete denture fabrication. Dr. Kulinski 

## 2014-11-08 ENCOUNTER — Ambulatory Visit (HOSPITAL_COMMUNITY): Payer: Medicaid - Dental | Admitting: Dentistry

## 2014-11-08 ENCOUNTER — Encounter (HOSPITAL_COMMUNITY): Payer: Self-pay | Admitting: Dentistry

## 2014-11-08 VITALS — BP 131/86 | HR 60 | Temp 98.1°F

## 2014-11-08 DIAGNOSIS — K000 Anodontia: Secondary | ICD-10-CM

## 2014-11-08 DIAGNOSIS — K08109 Complete loss of teeth, unspecified cause, unspecified class: Secondary | ICD-10-CM

## 2014-11-08 DIAGNOSIS — K117 Disturbances of salivary secretion: Secondary | ICD-10-CM

## 2014-11-08 DIAGNOSIS — R682 Dry mouth, unspecified: Secondary | ICD-10-CM

## 2014-11-08 DIAGNOSIS — Z923 Personal history of irradiation: Secondary | ICD-10-CM

## 2014-11-08 DIAGNOSIS — Z463 Encounter for fitting and adjustment of dental prosthetic device: Secondary | ICD-10-CM

## 2014-11-08 DIAGNOSIS — K0820 Unspecified atrophy of edentulous alveolar ridge: Secondary | ICD-10-CM

## 2014-11-08 DIAGNOSIS — C099 Malignant neoplasm of tonsil, unspecified: Secondary | ICD-10-CM

## 2014-11-08 NOTE — Progress Notes (Signed)
11/08/2014  Patient Name:   Matthewjames Petrasek Date of Birth:   Oct 06, 1955 Medical Record Number: [id number]  BP 131/86 mmHg  Pulse 60  Temp(Src) 98.1 F (36.7 C) (Oral)   [name] presents for insertion of upper and lower complete dentures.  Procedure: Pressure indicating paste was applied to the dentures. Adjustments were made as needed. [place] Thick PIP to borders with adjustments as needed. [place]). Occlusion evaluated and adjustments made as needed for Centric Relation and protrusive strokes. Good esthetics, phonetics, fit, and function noted. Patient accepts results. Post op instructions provided in written and verbal formats on use and care of dentures. Gave patient denture brush and denture cup. Patient to keep dentures out if sore spots develop. Use salt water rinses as needed to aid healing. Return to clinic as scheduled for denture adjustment.   Call if problems arise before then.  Lenn Cal, DDS

## 2014-11-08 NOTE — Patient Instructions (Signed)
Instructions for Denture Use and Care  Congratulations, you are on the way to oral rehabilitation!  You have just received a new set of complete or partial dentures.  These prostheses will help to improve both your appearance and chewing ability.  These instructions will help you get adjusted to your dentures as well as care for them properly.  Please read these instructions carefully and completely as soon as you get home.  If you or your caregiver have any questions please notify the [facility] at [phone].  HOW YOUR DENTURES LOOK AND FEEL Soon after you begin wearing your dentures, you may feel that your dentures are too large or even loose.  As our mouth and facial muscles become accustomed to the dentures, these feelings will go away.  You also may feel that you are salivating more than you normally do.  This feeling should go away as you get used to having the dentures in your mouth.  You may bite your cheek or your tongue; this will eventually resolve itself as you wear your dentures.  Some soreness is to be expected, but you should not hurt.  If your mouth hurts, call your dentist.  A denture adhesive may occasionally be necessary to hold your dentures in place more securely.  The dentist will let you know when one is recommended for you.  SPEAKING Wearing dentures will change the sound of your voice initially.  This will be noticed by you more than anyone else.  Bite and swallow before you speak, in order to place your dentures in position so that you may speak more clearly.  Practice speaking by reading aloud or counting from 1 to 100 very slowly and distinctly.  After some practice your mouth will become accustomed to your dentures and you will speak more clearly.  EATING Chewing will definitely be different after you receive your dentures.  With a little practice and patience you should be able to eat just about any kind of food.  Begin by eating small quantities of food  that are cut into small pieces.  Star with soft foods such as eggs, cooked vegetables, or puddings.  As you gain confidence advance  Your diet to whatever texture foods you can tolerate.  DENTURE CARE Dentures can collect plaque and calculus much the same as natural teeth can.  If not removed on a regular basis, your dentures will not look or feel clean, and you will experience denture odor.  It is very important that you remove your dentures at bedtime and clean them thoroughly.  You should: 1. Clean your dentures over a sink full of water so if dropped, breakage will be prevented. 2. Rinse your dentures with cool water to remove any large food particles. 3. Use soap and water or a denture cleanser or paste to clean the dentures.  Do not use regular toothpaste as it may abrade the denture base or teeth. 4. Use a moistened denture brush to clean all surfaces (inside and outside). 5. Rinse thoroughly to remove any remaining soap or denture cleanser. 6. Use a soft bristle toothbrush to gently brush any natural teeth, gums, tongue, and palate at bedtime and before reinserting your dentures. 7. Do not sleep with your dentures in your mouth at night.  Remove your dentures and soak them overnight in a denture cup filled with water or denture solution as recommended by your dentist.  This routine will become second nature and will increase the life and comfort   of your dentures.  Please do not try to adjust these dentures yourself; you could damage them.  FOLLOW-UP You should call or make an appointment with your dentist.  Your dentist would like to see you at least once a year for a check-up and examination. 

## 2014-11-13 ENCOUNTER — Ambulatory Visit (HOSPITAL_COMMUNITY): Payer: Medicaid - Dental | Admitting: Dentistry

## 2014-11-13 ENCOUNTER — Encounter (HOSPITAL_COMMUNITY): Payer: Self-pay | Admitting: Dentistry

## 2014-11-13 VITALS — BP 101/59 | HR 73 | Temp 98.0°F

## 2014-11-13 DIAGNOSIS — R682 Dry mouth, unspecified: Secondary | ICD-10-CM

## 2014-11-13 DIAGNOSIS — C099 Malignant neoplasm of tonsil, unspecified: Secondary | ICD-10-CM

## 2014-11-13 DIAGNOSIS — Z463 Encounter for fitting and adjustment of dental prosthetic device: Secondary | ICD-10-CM

## 2014-11-13 DIAGNOSIS — K08109 Complete loss of teeth, unspecified cause, unspecified class: Secondary | ICD-10-CM

## 2014-11-13 DIAGNOSIS — Z923 Personal history of irradiation: Secondary | ICD-10-CM

## 2014-11-13 DIAGNOSIS — K117 Disturbances of salivary secretion: Secondary | ICD-10-CM

## 2014-11-13 DIAGNOSIS — K000 Anodontia: Secondary | ICD-10-CM

## 2014-11-13 NOTE — Patient Instructions (Signed)
Patient to keep dentures out if sore spots develop. Use salt water rinses as needed to aid healing. Return to clinic as scheduled for denture adjustment.   Call if problems arise before then.  Ronald F. Kulinski, DDS  

## 2014-11-13 NOTE — Progress Notes (Signed)
11/13/2014  Patient Name:   Melvin Hudson Date of Birth:   1955/07/11 Medical Record Number: [id number]  BP 101/59 mmHg  Pulse 73  Temp(Src) 98 F (36.7 C) (Oral)   [name] presents for evaluation of recently inserted upper and lower complete dentures. SUBJECTIVE: Patient is complaining of denture irritation to the lower left and right lingual extensions. The patient indicates, however, that he has a "everything from steak, to peanuts, and fried chicken". OBJECTIVE: There is no sign of denture irritation. Slight erythema is noted on the lower right lingual extension. Procedure: Pressure indicating paste was applied to the dentures. Adjustments were made as needed. [place]). Thick PIP to denture borders. Adjustments made as needed. [place]). Occlusion evaluated and adjustments made as needed for Centric Relation and protrusive strokes. Patient accepts results. Patient to keep dentures out if sore spots develop. Use salt water rinses as needed to aid healing. Return to clinic as scheduled for denture adjustment.   Call if problems arise before then.  Lenn Cal, DDS

## 2014-11-22 NOTE — Progress Notes (Signed)
Pain issues, if any: He only complains of pain in his leg, rated an 8, and states this pain is related to arthritis Using a feeding tube?: no Weight changes, if any:  Wt Readings from Last 3 Encounters:  11/24/14 319 lb 12.8 oz (145.06 kg)  05/26/14 305 lb 12.8 oz (138.71 kg)  04/18/14 305 lb (138.347 kg)   Swallowing issues, if any: He denies any swallowing problems Smoking or chewing tobacco? No Using fluoride trays daily? No Last ENT visit was on: He states he saw Dr. Constance Holster about 2 months, and states he needs to make appointment for another follow-up within the next month. Other notable issues, if any: None

## 2014-11-24 ENCOUNTER — Telehealth: Payer: Self-pay | Admitting: *Deleted

## 2014-11-24 ENCOUNTER — Encounter: Payer: Self-pay | Admitting: Adult Health

## 2014-11-24 ENCOUNTER — Ambulatory Visit (HOSPITAL_BASED_OUTPATIENT_CLINIC_OR_DEPARTMENT_OTHER)
Admission: RE | Admit: 2014-11-24 | Discharge: 2014-11-24 | Disposition: A | Discharge status: Home / Self Care | Payer: Medicare Other | Source: Ambulatory Visit | Attending: Radiation Oncology | Admitting: Radiation Oncology

## 2014-11-24 ENCOUNTER — Ambulatory Visit
Admission: RE | Admit: 2014-11-24 | Discharge: 2014-11-24 | Disposition: A | Discharge status: Home / Self Care | Payer: Medicare Other | Source: Ambulatory Visit | Attending: Radiation Oncology | Admitting: Radiation Oncology

## 2014-11-24 VITALS — BP 145/92 | HR 67 | Temp 97.6°F | Ht 73.0 in | Wt 319.8 lb

## 2014-11-24 DIAGNOSIS — C099 Malignant neoplasm of tonsil, unspecified: Secondary | ICD-10-CM

## 2014-11-24 DIAGNOSIS — R635 Abnormal weight gain: Secondary | ICD-10-CM

## 2014-11-24 LAB — TSH CHCC: TSH: 1.463 m(IU)/L (ref 0.320–4.118)

## 2014-11-24 NOTE — Progress Notes (Signed)
Radiation Oncology         [phone] ________________________________  Name: Melvin Hudson MRN: [id number]  Date: 11/24/2014  DOB: December 30, 1955  Follow-Up Visit Note  Outpatient  CC: Kristine Garbe, MD  [name], MD    ICD-9-CM ICD-10-CM   1. 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   Pain issues, if any: He only complains of pain in his leg, rated an 8, and states this pain is related to arthritis Using a feeding tube?: no Weight changes, if any: GAIN Wt Readings from Last 3 Encounters:  11/24/14 319 lb 12.8 oz (145.06 kg)  05/26/14 305 lb 12.8 oz (138.71 kg)  04/18/14 305 lb (138.347 kg)   Swallowing issues, if any: He denies any swallowing problems Smoking or chewing tobacco? No Using fluoride trays daily? No Last ENT visit was on: He states he saw Dr. Constance Holster about 2 months, and states he needs to make appointment for another follow-up within the next month. Other notable issues, if any: None   Patient states he is doing well.                          ALLERGIES:  is allergic to advil and azilsartan.  Meds: Current Outpatient Prescriptions  Medication Sig Dispense Refill  . carvedilol (COREG) 12.5 MG tablet Take 12.5 mg by mouth 2 (two) times daily with a meal.    . diphenhydrAMINE (BENADRYL) 25 MG tablet Take 1 tablet (25 mg total) by mouth every 6 (six) hours. 20 tablet 0  . NIFEdipine (ADALAT CC) 90 MG 24 hr tablet Take 90 mg by mouth daily.    [name] oxyCODONE-acetaminophen (PERCOCET) 10-325 MG per tablet Take 1 tablet by mouth  every 6 (six) hours as needed for pain. 120 tablet 0  . polyethylene glycol (MIRALAX / GLYCOLAX) packet Take 17 g by mouth daily. 14 each 0  . spironolactone (ALDACTONE) 25 MG tablet Take 50 mg by mouth 2 (two) times daily.     . tadalafil (CIALIS) 20 MG tablet Take 20 mg by mouth daily as needed for erectile dysfunction.     . Cholecalciferol (VITAMIN D-3 PO) Take 1,000 Units by mouth daily.     No current facility-administered medications for this encounter.    Physical Findings: The patient is in no acute distress. Patient is alert and oriented.  height is 6' 1"  (1.854 m) and weight is 319 lb 12.8 oz (145.06 kg). His temperature is 97.6 F (36.4 C). His blood pressure is 145/92 and his pulse is 67. His oxygen saturation is 97%. .    General: Alert and oriented, in no acute distress HEENT: Head is normocephalic. Extraocular movements are intact. Oropharynx is clear. Neck: Modest anterior lymphedema, no palpable masses  Heart: Regular in rate and rhythm with no murmurs, rubs, or gallops. Chest: Clear to auscultation bilaterally, with no rhonchi, wheezes, or rales. Oral:No lesions, no thrush, no tumor Skin: Smooth and dry over neck  Lab Findings: Lab Results  Component Value Date   WBC 4.8 04/20/2014  HGB 8.5* 04/20/2014   HCT 26.2* 04/20/2014   MCV 86.2 04/20/2014   PLT 215 04/20/2014   Lab Results  Component Value Date   TSH 1.463 11/24/2014     Radiographic Findings: No results found.  Impression/Plan:    No evidence of disease. Minimal complaints today. Thyroid test reveals TSH is normal.  Gayleen Orem, RN, our Head and Neck Oncology Navigator will call Dr. Janeice Robinson clinic to arrange follow up in their office in 3 months. I will see the patient back in 7 months, a card was given for him to schedule this. He met with Mike Craze ,NP to talk about the survivorship program. If he is doing well at his next follow up with me, he should be ready to enroll in that program  with her.    _____________________________________   Eppie Gibson, MD  This document serves as a record of services personally performed by Eppie Gibson, MD. It was created on her behalf by Derek Mound, a trained medical scribe. The creation of this record is based on the scribe's personal observations and the provider's statements to them. This document has been checked and approved by the attending provider.

## 2014-11-24 NOTE — Telephone Encounter (Signed)
  Oncology Nurse Navigator Documentation   Navigator Encounter Type: Telephone (11/24/14 1525)         Interventions: Coordination of Care (11/24/14 1525)   Coordination of Care: MD Appointments (11/24/14 1525)     Per Dr. Pearlie Oyster guidance, called [facility] to arrange follow-up visit.  Spoke with Hedda Slade, requested that patient be contacted and appt arranged to see Dr. Constance Holster for routine follow-up in 3 months.  She verbalized understanding.  Gayleen Orem, RN, BSN, [facility] at [place] [phone]        Time Spent with Patient: 15 (11/24/14 1525)

## 2014-11-24 NOTE — Progress Notes (Signed)
I briefly met Mr. Melvin Hudson  today during his visit with Dr. Isidore Moos.  Mr. Melvin Hudson is almost 2 years out from his diagnosis and is clinically without evidence of disease.  Dr. Isidore Moos spoke with the patient about having subsequent follow-up visits annually with our survivorship team and Mr. Melvin Hudson  agreed.  I discussed with him the role of survivorship and my role in his cancer care.  I gave him a copy of the "Life After Cancer for Every Survivor" booklet, along with my business card and encouraged him to call me with any questions or concerns.    Dr. Isidore Moos will likely refer him to survivorship after his next visit with her, which will be in about 7-8 months.  I look forward to participating in his care.    Mike Craze, NP [place] [phone]

## 2014-11-27 ENCOUNTER — Encounter (HOSPITAL_COMMUNITY): Payer: Self-pay | Admitting: Dentistry

## 2014-11-28 ENCOUNTER — Ambulatory Visit (HOSPITAL_COMMUNITY): Payer: Medicaid - Dental | Admitting: Dentistry

## 2014-11-28 ENCOUNTER — Encounter (HOSPITAL_COMMUNITY): Payer: Self-pay | Admitting: Dentistry

## 2014-11-28 VITALS — BP 126/78 | HR 72 | Temp 98.3°F

## 2014-11-28 DIAGNOSIS — K117 Disturbances of salivary secretion: Secondary | ICD-10-CM

## 2014-11-28 DIAGNOSIS — K000 Anodontia: Secondary | ICD-10-CM

## 2014-11-28 DIAGNOSIS — R682 Dry mouth, unspecified: Secondary | ICD-10-CM

## 2014-11-28 DIAGNOSIS — K08109 Complete loss of teeth, unspecified cause, unspecified class: Secondary | ICD-10-CM

## 2014-11-28 DIAGNOSIS — C099 Malignant neoplasm of tonsil, unspecified: Secondary | ICD-10-CM

## 2014-11-28 DIAGNOSIS — Z923 Personal history of irradiation: Secondary | ICD-10-CM

## 2014-11-28 DIAGNOSIS — Z463 Encounter for fitting and adjustment of dental prosthetic device: Secondary | ICD-10-CM

## 2014-11-28 NOTE — Patient Instructions (Signed)
Patient to keep dentures out if sore spots develop. Use salt water rinses as needed to aid healing. Return to clinic as scheduled for denture adjustment.   Call if problems arise before then.  Taquana Bartley F. Dvora Buitron, DDS  

## 2014-11-28 NOTE — Progress Notes (Signed)
11/28/2014  Patient Name:   Melvin Hudson Date of Birth:   Jul 06, 1955 Medical Record Number: [id number]  BP 126/78 mmHg  Pulse 72  Temp(Src) 98.3 F (36.8 C) (Oral)   [name] presents for evaluation of recently inserted upper and lower complete dentures.  SUBJECTIVE: Patient is complaining of denture irritation to the lower left lingual.   OBJECTIVE: There is no sign of denture irritation.   Procedure: Pressure indicating paste was applied to the dentures. Adjustments were made as needed. Thick PIP to denture borders. Adjustments made as needed. [place] Occlusion evaluated and adjustments made as needed for Centric Relation and protrusive strokes. Patient accepts results. Patient to keep dentures out if sore spots develop. Use salt water rinses as needed to aid healing. Return to clinic as scheduled for denture adjustment.   Call if problems arise before then.  Lenn Cal, DDS

## 2014-11-29 ENCOUNTER — Ambulatory Visit: Payer: Self-pay | Admitting: Pulmonary Disease

## 2014-11-29 ENCOUNTER — Ambulatory Visit (INDEPENDENT_AMBULATORY_CARE_PROVIDER_SITE_OTHER): Payer: Medicare Other | Admitting: Pulmonary Disease

## 2014-11-29 ENCOUNTER — Encounter: Payer: Self-pay | Admitting: Pulmonary Disease

## 2014-11-29 VITALS — BP 120/82 | HR 67 | Temp 97.6°F | Ht 73.5 in | Wt 317.4 lb

## 2014-11-29 DIAGNOSIS — G4733 Obstructive sleep apnea (adult) (pediatric): Secondary | ICD-10-CM | POA: Diagnosis not present

## 2014-11-29 DIAGNOSIS — Z9989 Dependence on other enabling machines and devices: Principal | ICD-10-CM

## 2014-11-29 NOTE — Progress Notes (Signed)
Chief Complaint  Patient presents with  . Follow-up    Former [place] pt. p t states he is sleeping good. pt using CPAP every night  for about 5 - 6 hours. Prsessure and good for pt. no concerns at this time. no dwnld available. DME: [facility] [employer].    History of Present Illness: Melvin Hudson is a 59 y.o. male with OSA.  He has full face mask.  He is thinking about trying nasal mask.  He uses CPAP about 6 hrs per night on average.  He will sometimes sleep w/o CPAP if he falls asleep while watching TV.  He doesn't feel like he needs anything to help sleep anymore, and doesn't feel sleepy during the day.  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   Physical Exam: BP 120/82 mmHg  Pulse 67  Temp(Src) 97.6 F (36.4 C) (Oral)  Ht 6' 1.5" (1.867 m)  Wt 317 lb 6.4 oz (143.972 kg)  BMI 41.30 kg/m2  SpO2 99%  General - No distress ENT - No sinus tenderness, no oral exudate, no LAN, wears dentures Cardiac - s1s2 regular, no murmur Chest - No wheeze/rales/dullness Back - No focal tenderness Abd - Soft, non-tender Ext - No edema Neuro - Normal strength Skin - No rashes Psych - normal mood, and behavior   Assessment/Plan:  Obstructive sleep apnea. He is compliant with CPAP and reports benefit. Plan: - continue auto CPAP  - will call him with report of CPAP download  Morbid obesity. Plan: - encouraged him to keep up with his weight loss efforts   Chesley Mires, MD [place] [facility] Pager:  [phone]

## 2014-11-29 NOTE — Patient Instructions (Signed)
Will call with CPAP download results  Follow up in 1 year

## 2015-01-01 ENCOUNTER — Ambulatory Visit (HOSPITAL_COMMUNITY): Payer: Medicaid - Dental | Admitting: Dentistry

## 2015-01-01 ENCOUNTER — Encounter (HOSPITAL_COMMUNITY): Payer: Self-pay | Admitting: Dentistry

## 2015-01-01 VITALS — BP 126/86 | HR 67 | Temp 97.7°F

## 2015-01-01 DIAGNOSIS — K117 Disturbances of salivary secretion: Secondary | ICD-10-CM

## 2015-01-01 DIAGNOSIS — R682 Dry mouth, unspecified: Secondary | ICD-10-CM

## 2015-01-01 DIAGNOSIS — C099 Malignant neoplasm of tonsil, unspecified: Secondary | ICD-10-CM

## 2015-01-01 DIAGNOSIS — K08109 Complete loss of teeth, unspecified cause, unspecified class: Secondary | ICD-10-CM

## 2015-01-01 DIAGNOSIS — K0820 Unspecified atrophy of edentulous alveolar ridge: Secondary | ICD-10-CM

## 2015-01-01 DIAGNOSIS — Z463 Encounter for fitting and adjustment of dental prosthetic device: Secondary | ICD-10-CM

## 2015-01-01 DIAGNOSIS — Z923 Personal history of irradiation: Secondary | ICD-10-CM

## 2015-01-01 DIAGNOSIS — K000 Anodontia: Secondary | ICD-10-CM

## 2015-01-01 NOTE — Progress Notes (Signed)
01/01/2015  Patient Name:   Melvin Hudson Date of Birth:   [birth year] Medical Record Number: [id number]  BP 126/86 mmHg  Pulse 67  Temp(Src) 97.7 F (36.5 C) (Oral)   [name] presents for evaluation of recently inserted upper and lower complete dentures.  SUBJECTIVE: Patient is NOT complaining of any denture irritation.   OBJECTIVE: There is no sign of denture irritation.   Procedure: Pressure indicating paste was applied to the dentures. Adjustments were made as needed. Thick PIP to denture borders. Adjustments made as needed. [place] Occlusion evaluated and adjustments made as needed for Centric Relation and protrusive strokes. Patient accepts results. Patient to keep dentures out if sore spots develop. Use salt water rinses as needed to aid healing. Return to clinic as scheduled for denture adjustment.   Call if problems arise before then.  Lenn Cal, DDS

## 2015-01-01 NOTE — Patient Instructions (Signed)
Patient to keep dentures out if sore spots develop. Use salt water rinses as needed to aid healing. Return to clinic as scheduled for denture adjustment.   Call if problems arise before then.  Ariba Lehnen F. Alma Muegge, DDS  

## 2015-01-15 ENCOUNTER — Telehealth: Payer: Self-pay | Admitting: Pulmonary Disease

## 2015-01-15 NOTE — Telephone Encounter (Signed)
CPAP 06/02/14 to 07/01/14 >> used on 26 of 30 nights with average 6 hrs and 51 min.  Average AHI is 0.6 with CPAP 14 cm H2O.  Will have my nurse inform pt that CPAP report looked good.

## 2015-01-15 NOTE — Telephone Encounter (Signed)
Results have been explained to patient, pt expressed understanding. Nothing further needed.  

## 2015-04-03 ENCOUNTER — Encounter (HOSPITAL_COMMUNITY): Payer: Self-pay | Admitting: Dentistry

## 2015-04-10 ENCOUNTER — Encounter (HOSPITAL_COMMUNITY): Payer: Self-pay | Admitting: Dentistry

## 2015-04-10 ENCOUNTER — Ambulatory Visit (HOSPITAL_COMMUNITY): Payer: Medicaid - Dental | Admitting: Dentistry

## 2015-04-10 VITALS — BP 126/82 | HR 66 | Temp 97.8°F

## 2015-04-10 DIAGNOSIS — Z463 Encounter for fitting and adjustment of dental prosthetic device: Secondary | ICD-10-CM

## 2015-04-10 DIAGNOSIS — C099 Malignant neoplasm of tonsil, unspecified: Secondary | ICD-10-CM

## 2015-04-10 DIAGNOSIS — K08109 Complete loss of teeth, unspecified cause, unspecified class: Secondary | ICD-10-CM

## 2015-04-10 DIAGNOSIS — K000 Anodontia: Secondary | ICD-10-CM

## 2015-04-10 DIAGNOSIS — K117 Disturbances of salivary secretion: Secondary | ICD-10-CM

## 2015-04-10 DIAGNOSIS — R682 Dry mouth, unspecified: Secondary | ICD-10-CM

## 2015-04-10 DIAGNOSIS — Z923 Personal history of irradiation: Secondary | ICD-10-CM

## 2015-04-10 DIAGNOSIS — K0820 Unspecified atrophy of edentulous alveolar ridge: Secondary | ICD-10-CM

## 2015-04-10 NOTE — Patient Instructions (Signed)
Patient to keep dentures out if sore spots develop. Use salt water rinses as needed to aid healing. Return to clinic as scheduled for denture recall and adjustments.   Call if problems arise before then.  Lenn Cal, DDS

## 2015-04-10 NOTE — Progress Notes (Signed)
04/10/2015  Patient Name:   Melvin Hudson Date of Birth:   1956/02/03 Medical Record Number: [id number]  BP 126/82 mmHg  Pulse 66  Temp(Src) 97.8 F (36.6 C) (Oral)  [name] presents for evaluation of upper and lower complete dentures. SUBJECTIVE: Patient is NOT complaining of any denture irritation. The patient is still getting used to eating with his dentures. OBJECTIVE: There is no sign of denture irritation.  Xerostomia is noted. Procedure: Pressure indicating paste was applied to the dentures. Adjustments were made as needed. [place] Occlusion evaluated and adjustments made as needed for Centric Relation and protrusive strokes. Patient was some tendency to protrude to end to end position. The patient was instructed on finding maximum intercuspation position. Patient accepts results. Discussed upper and lower complete denture relines. Patient wishes to hold off on these procedures for now. Patient is to contact dental medicine if he is about to lose his Medicaid dental coverage. Patient to keep dentures out if sore spots develop. Use salt water rinses as needed to aid healing. Return to clinic as scheduled for denture recall and adjustments.   Call if problems arise before then.  Lenn Cal, DDS

## 2015-06-15 NOTE — Progress Notes (Signed)
Melvin Hudson presents for follow up of radiation completed 09/15/13 to his Right Tonsil and bilateral neck.    Skin issues, if any: None Pain issues, if any: He has pain a 7/10 in his knees and feet. He reports occasional sharp pain in throat about once a week. They come and go quickly. Using a feeding tube?: No Weight changes, if any:  Wt Readings from Last 3 Encounters:  06/22/15 317 lb 8 oz (144.017 kg)  11/29/14 317 lb 6.4 oz (143.972 kg)  11/24/14 319 lb 12.8 oz (145.06 kg)   Swallowing issues, if any: He denies any problems swallowing Smoking or chewing tobacco? No Using fluoride trays daily? He has partials. Last ENT visit was on: Dr. Constance Holster 06/04/15. Melvin Hudson states he had no concerns.  Other notable issues, if any:  His taste buds are improving slowly.   BP 122/82 mmHg  Pulse 71  Temp(Src) 98.2 F (36.8 C)  Ht 6' 1.5" (1.867 m)  Wt 317 lb 8 oz (144.017 kg)  BMI 41.32 kg/m2  SpO2 100%

## 2015-06-22 ENCOUNTER — Ambulatory Visit
Admission: RE | Admit: 2015-06-22 | Discharge: 2015-06-22 | Disposition: A | Discharge status: Home / Self Care | Payer: Medicare Other | Source: Ambulatory Visit | Attending: Radiation Oncology | Admitting: Radiation Oncology

## 2015-06-22 ENCOUNTER — Encounter: Payer: Self-pay | Admitting: Radiation Oncology

## 2015-06-22 VITALS — BP 122/82 | HR 71 | Temp 98.2°F | Ht 73.5 in | Wt 317.5 lb

## 2015-06-22 DIAGNOSIS — C099 Malignant neoplasm of tonsil, unspecified: Secondary | ICD-10-CM

## 2015-06-22 DIAGNOSIS — R5383 Other fatigue: Secondary | ICD-10-CM

## 2015-06-22 LAB — TSH: TSH: 1.076 m(IU)/L (ref 0.320–4.118)

## 2015-06-22 NOTE — Progress Notes (Signed)
Radiation Oncology         [id number]) [phone] ________________________________  Name: Melvin Hudson MRN: [id number]  Date: 06/22/2015  DOB: Dec 20, 1955  Follow-Up Visit Note  Outpatient  CC: Kristine Garbe, MD  [name], MD    ICD-9-CM ICD-10-CM   1. Malignant neoplasm of tonsil ([place]) 146.0 C09.9 TSH  2. 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   Pain issues, if any: He reports chronic pain at a 7/10 in his knees and feet, preceded his cancer. He reports occasional sharp pain in his throat about once a week. He states that this pain comes and goes quickly.  Using a feeding tube?: No Weight changes, if any:  Wt Readings from Last 3 Encounters:   06/22/15  317 lb 8 oz (144.017 kg)   11/29/14  317 lb 6.4 oz (143.972 kg)   11/24/14  319 lb 12.8 oz (145.06 kg)   Swallowing issues, if any: None Smoking or chewing tobacco? No Using fluoride trays daily? He has partials Last ENT visit was on: 06/04/2015 with Dr. Constance Holster.  Other notable issues, if any: He reports that his taste buds are improving slowly.   He is somewhat tired out, related to helping his sister with a diagnosis of cancer as well as a serious stroke.              ALLERGIES:  is allergic to advil and azilsartan.  Meds: Current Outpatient Prescriptions  Medication Sig Dispense Refill  . aspirin 81 MG tablet Take 81 mg by mouth every other day.    . carvedilol (COREG) 12.5 MG tablet Take 12.5 mg by mouth 2 (two) times daily with a meal.    . diphenhydrAMINE (BENADRYL) 25 MG tablet Take 1 tablet (25 mg total)  by mouth every 6 (six) hours. 20 tablet 0  . NIFEdipine (ADALAT CC) 90 MG 24 hr tablet Take 90 mg by mouth daily.    [name] oxyCODONE-acetaminophen (PERCOCET) 10-325 MG per tablet Take 1 tablet by mouth every 6 (six) hours as needed for pain. 120 tablet 0  . polyethylene glycol (MIRALAX / GLYCOLAX) packet Take 17 g by mouth daily. 14 each 0  . spironolactone (ALDACTONE) 25 MG tablet Take 50 mg by mouth once.     . Cholecalciferol (VITAMIN D-3 PO) Take 1,000 Units by mouth daily. Reported on 06/22/2015    . tadalafil (CIALIS) 20 MG tablet Take 20 mg by mouth daily as needed for erectile dysfunction. Reported on 06/22/2015     No current facility-administered medications for this encounter.    Physical Findings:  height is 6' 1.5" (1.867 m) and weight is 317 lb 8 oz (144.017 kg). His temperature is 98.2 F (36.8 C). His blood pressure is 122/82 and his pulse is 71. His oxygen saturation is 100%.   General: Alert and oriented, in no acute distress HEENT: Head is normocephalic. Extraocular movements are intact. Oral cavity is slightly dry but no lesions in the oral cavity or oropharynx. Edentulous. Neck: Neck is supple, no palpable cervical or supraclavicular lymphadenopathy. Very mild anterior lymphedema and some moderate fibrosis  in the right neck.  Heart: Regular in rate and rhythm with no murmurs, rubs, or gallops. Chest: Clear to auscultation bilaterally, with no rhonchi, wheezes, or rales. Abdomen: Soft, nontender, nondistended, with no rigidity or guarding. Lymphatics: see Neck Exam Skin: No concerning lesions. Neurologic: Cranial nerves II through XII are grossly intact. No obvious focalities. Speech is fluent. Coordination is intact. Psychiatric: Judgment and insight are intact. Affect is appropriate.  Lab Findings: Lab Results  Component Value Date   WBC 4.8 04/20/2014   HGB 8.5* 04/20/2014   HCT 26.2* 04/20/2014   MCV 86.2 04/20/2014   PLT 215 04/20/2014   Lab Results  Component  Value Date   TSH 1.076 06/22/2015   Radiographic Findings: No results found.  Impression/Plan:  No evidence of disease. Minimal complaints today. He will be having TSH completed today (note- this was normal). He will have a nine month followup with Mike Craze of survivorship. I will see him again PRN. Otherwise, I'll discharge him to survivorship. He is most likely cured.  Recommend seeing Dr Constance Holster between now and then.  _____________________________________   Eppie Gibson, MD   This document serves as a record of services personally performed by Eppie Gibson, MD. It was created on her behalf by Jenell Milliner, a trained medical scribe. The creation of this record is based on the scribe's personal observations and the provider's statements to them. This document has been checked and approved by the attending provider

## 2015-06-25 ENCOUNTER — Other Ambulatory Visit: Payer: Self-pay | Admitting: Adult Health

## 2015-06-25 DIAGNOSIS — C099 Malignant neoplasm of tonsil, unspecified: Secondary | ICD-10-CM

## 2015-06-25 DIAGNOSIS — R5383 Other fatigue: Secondary | ICD-10-CM

## 2015-10-07 IMAGING — CT NM PET TUM IMG RESTAG (PS) SKULL BASE T - THIGH
8 series · 25 of 25 positions shown · non-contrast
Comparison: 07/04/2013 and CT of 06/23/2013.

CLINICAL DATA: Subsequent treatment strategy for restaging of
tonsillar carcinoma..

EXAM:
NUCLEAR MEDICINE PET SKULL BASE TO THIGH
TECHNIQUE: 14.6 mCi F-18 FDG was injected intravenously. Full-ring PET imaging
was performed from the skull base to thigh after the radiotracer. CT
data was obtained and used for attenuation correction and anatomic
localization.
FASTING BLOOD GLUCOSE:  Value: 94 mg/dl

[Series 3: pet hn_sk_thigh ac · axial · 5.0mm · 4.07mm/px · z∈[-500,+372]mm · 5 of 219 slices shown]
[im 1/219]
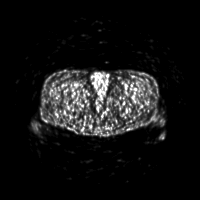
[im 55/219]
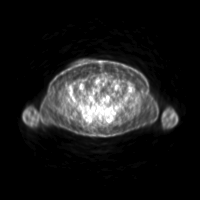
[im 110/219]
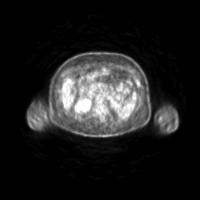
[im 164/219]
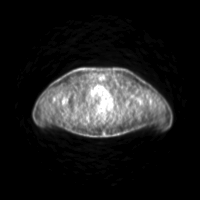
[im 219/219]
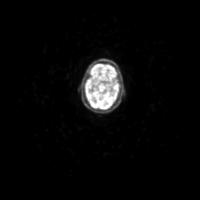

[Series 4: ct hn_sk_th 5.0 hd_fov · axial · 5.0mm · 1.13mm/px · z∈[-500,+372]mm · 5 of 219 slices shown]
[im 1/219]
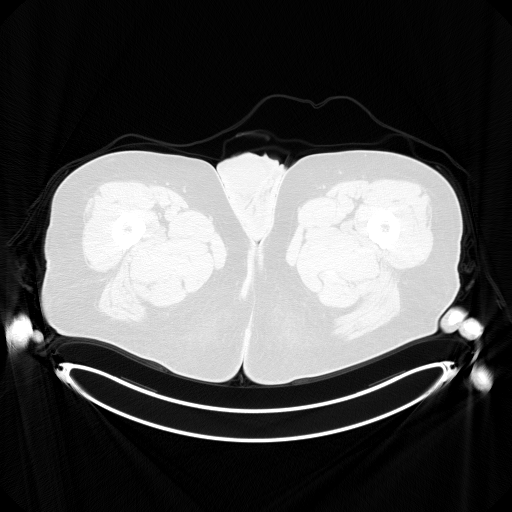
[im 55/219  brain]
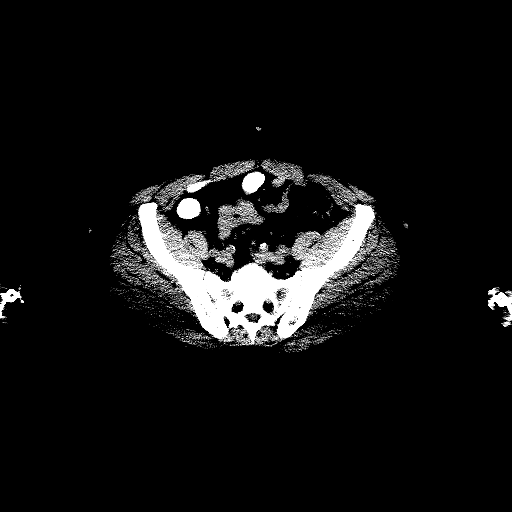
[im 110/219]
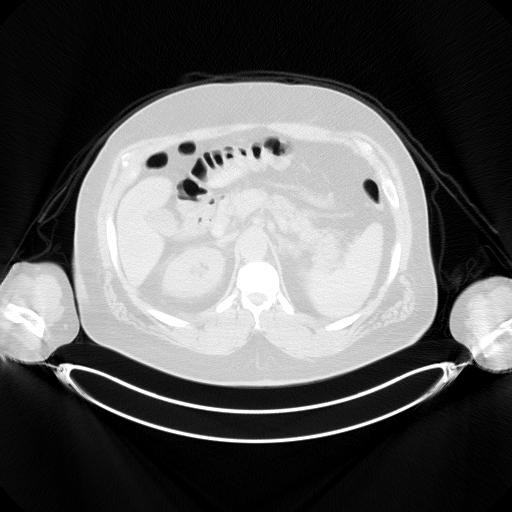
[im 164/219]
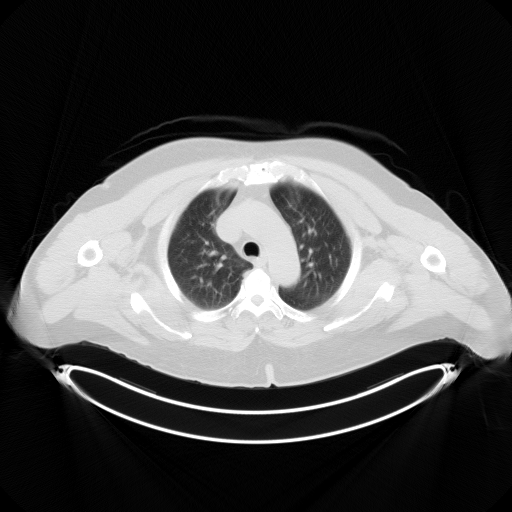
[im 219/219  brain]
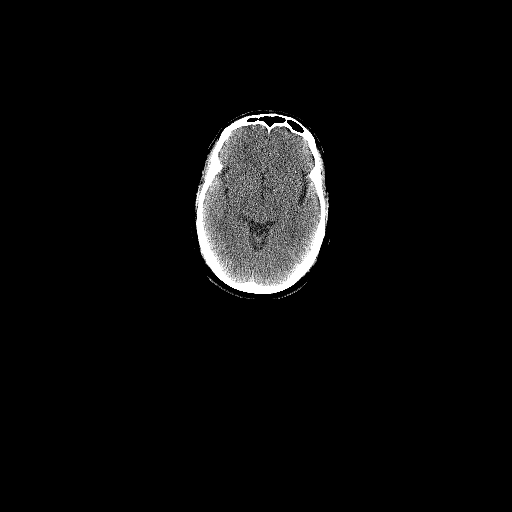

[Series 7: pet hn_sk_thigh nac · axial · 5.0mm · 4.07mm/px · z∈[-500,+372]mm · 5 of 219 slices shown]
[im 1/219]
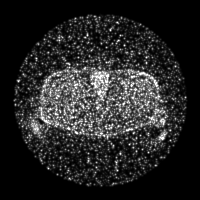
[im 55/219]
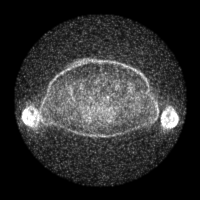
[im 110/219]
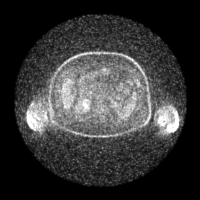
[im 164/219]
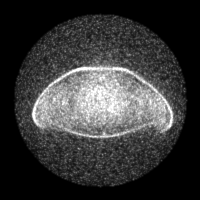
[im 219/219]
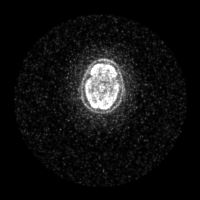

[Series 8: ct hn_sk_th 5.0 b70f lung_bone · axial · 5.0mm · 0.61mm/px · 1 of 56 slices shown]
[im 1/56  bone]
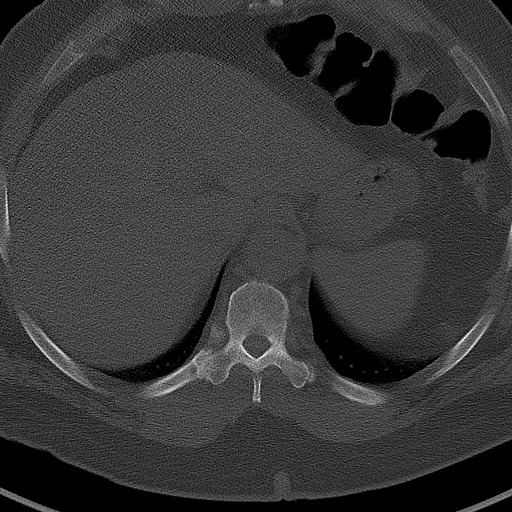

[Series 604: mip collection<mip range> · coronal · 1.81mm/px · 1 of 32 slices shown]
[im 1/32]
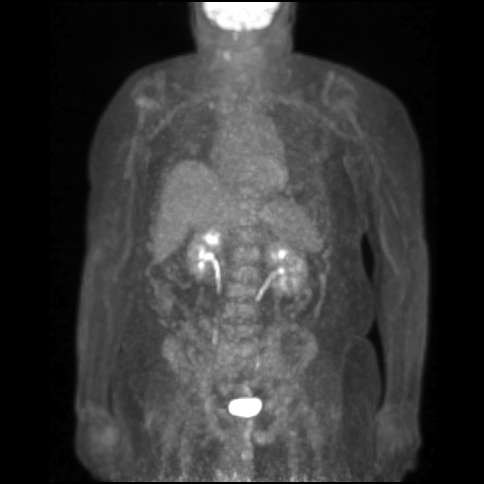

[Series 605: range-ct hn_sk_th 5.0 hd_fov-cor-<alpha range> · 2 of 69 slices shown]
[im 1/69]
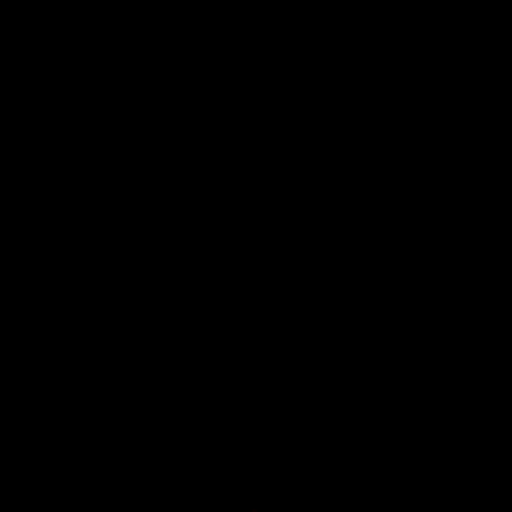
[im 69/69]
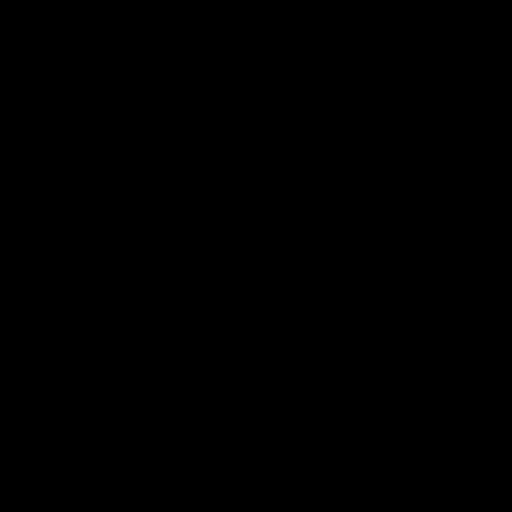

[Series 606: range-ct hn_sk_th 5.0 hd_fov-tra-<alpha range> · 5 of 196 slices shown]
[im 1/196]
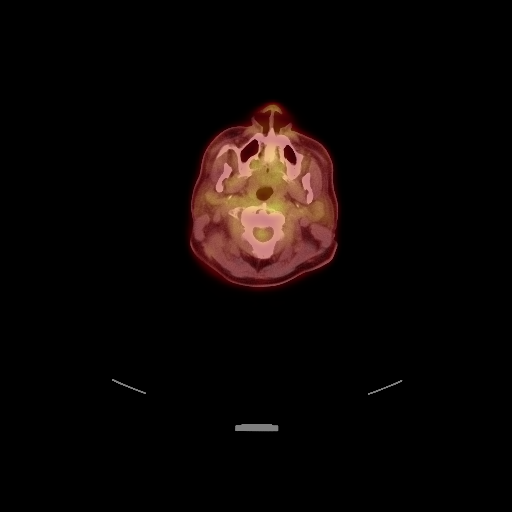
[im 49/196]
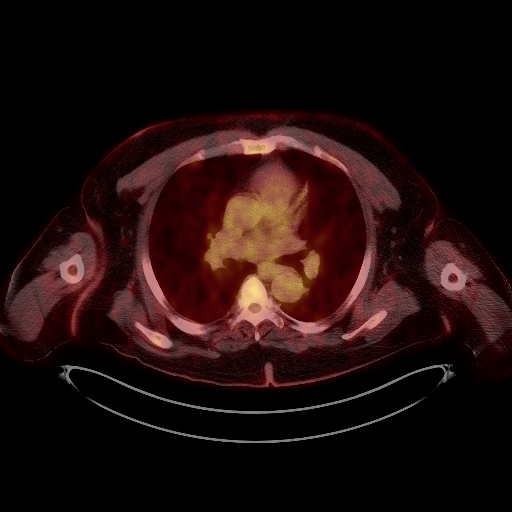
[im 98/196]
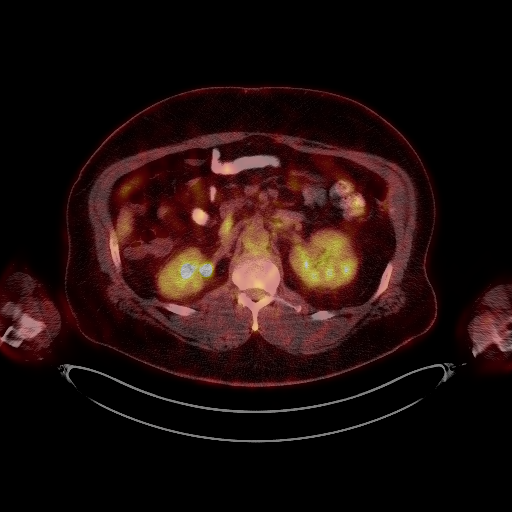
[im 147/196]
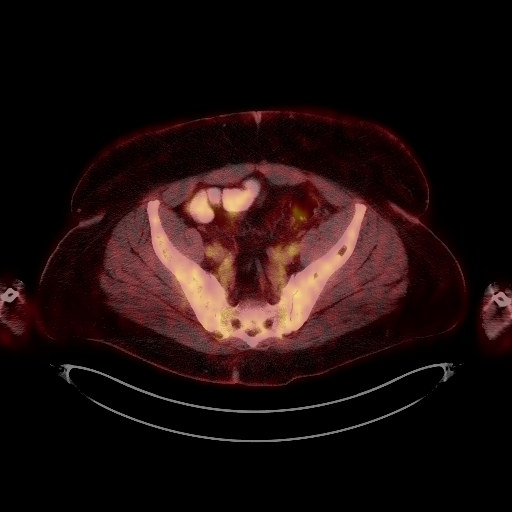
[im 196/196]
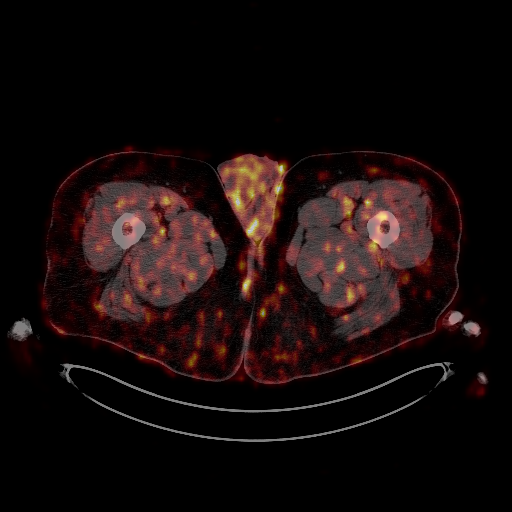

[Series 1073: results mm oncology reading · 0.94mm/px · 1 of 2 slices shown]
[im 1/2]
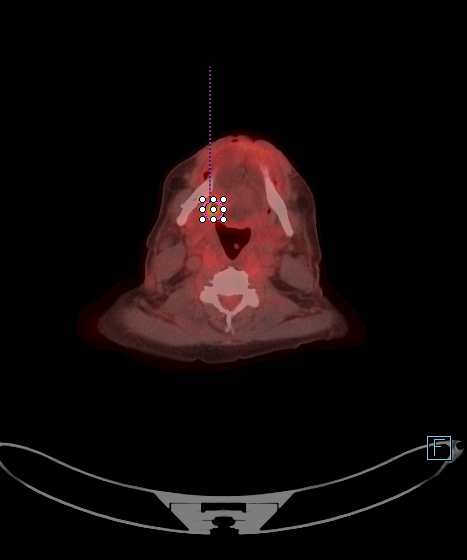

[25 of 25 positions shown; findings below may reference images not displayed]

FINDINGS: NECK

Improvement to resolution of right palatine tonsil hypermetabolism.
Mild residual activity is identified about the anterior aspect of
the right palatine tonsil and posterior lateral right tongue base.
No well-defined residual mass in this area. This measures a S.U.V.
max of 5.9, including on image 27 of series 4. On the prior exam,
the mass measured a S.U.V. max of 18.3. The contralateral tonsillar
hypermetabolism is resolved. No residual hypermetabolic cervical
nodes.

CHEST

No areas of abnormal hypermetabolism.

ABDOMEN/PELVIS

No areas of abnormal hypermetabolism.

SKELETON

Right rotator cuff arthropathy.  No suspicious osseous lesions.

CT IMAGES PERFORMED FOR ATTENUATION CORRECTION

No cervical adenopathy.

No thoracic adenopathy. Minimal subpleural right upper lobe
nodularity on image 20 is favored to be similar to on the prior exam
and non specific.

Bilateral perirenal edema is similar and could relate to renal
insufficiency. Aortic atherosclerosis. Scattered colonic
diverticula. Heterogeneously increased density throughout the marrow
space. Example within the pelvis on image 160 of series 4. Felt to
be similar.
IMPRESSION: 1. Response to therapy of right-sided tonsillar mass. Mild residual
hypermetabolism anteriorly could be treatment related. No
well-defined mass in this area. Consider direct visualization.
2. Response to therapy of cervical nodal metastasis.
3. No evidence of new or progressive disease.
4. Heterogeneous increased marrow density. Similar and without PET
correlate. Renal osteodystrophy could have this appearance.
Correlate with renal function.

## 2015-10-29 ENCOUNTER — Ambulatory Visit (HOSPITAL_COMMUNITY): Payer: Medicaid - Dental | Admitting: Dentistry

## 2015-10-29 ENCOUNTER — Encounter (HOSPITAL_COMMUNITY): Payer: Self-pay | Admitting: Dentistry

## 2015-10-29 VITALS — BP 141/87 | HR 66 | Temp 98.3°F

## 2015-10-29 DIAGNOSIS — K0820 Unspecified atrophy of edentulous alveolar ridge: Secondary | ICD-10-CM

## 2015-10-29 DIAGNOSIS — Z463 Encounter for fitting and adjustment of dental prosthetic device: Secondary | ICD-10-CM

## 2015-10-29 DIAGNOSIS — K117 Disturbances of salivary secretion: Secondary | ICD-10-CM

## 2015-10-29 DIAGNOSIS — R682 Dry mouth, unspecified: Secondary | ICD-10-CM

## 2015-10-29 DIAGNOSIS — K08109 Complete loss of teeth, unspecified cause, unspecified class: Secondary | ICD-10-CM

## 2015-10-29 DIAGNOSIS — K000 Anodontia: Secondary | ICD-10-CM

## 2015-10-29 DIAGNOSIS — C099 Malignant neoplasm of tonsil, unspecified: Secondary | ICD-10-CM

## 2015-10-29 DIAGNOSIS — Z923 Personal history of irradiation: Secondary | ICD-10-CM

## 2015-10-29 NOTE — Progress Notes (Signed)
10/29/2015  Patient Name:   Melvin Hudson Date of Birth:   September 09, 1955 Medical Record Number: [id number]  BP (!) 141/87 (BP Location: Left Arm)   Pulse 66   Temp 98.3 F (36.8 C) (Oral)   Melvin Hudson is a 60 year old male that presents for periodic oral exam and evaluation of dentures. Patient has a history of squamous cell carcinoma of the right tonsil. Patient had extraction of remaining teeth with alveoloplasty and lateral exostoses reductions on 07/06/2013. Patient then underwent radiation therapy from 07/26/2013 through 09/15/2013. Patient had upper and lower complete dentures fabricated and were inserted on 11/08/2014. Patient has been seen for multiple denture adjustment appointment since that time. The patient now presents for periodic oral examination and denture recall.  Medical Hx Update:  Past Medical History:  Diagnosis Date  . Angioedema 09/17/11   tongue and lips  . Arthritis    "both of my legs and feet" (01/05/2014)  . Hx of gout   . Hypertension   . Obesity   . OSA on CPAP   . S/P radiation therapy 07/26/2013-09/15/2013   Right tonsil/bilateral neck/ 7000 cGy  . Squamous cell carcinoma of right tonsil ([place]) 06/14/13  .  Past Surgical History:  Procedure Laterality Date  . COLONOSCOPY Left 04/11/2014   Procedure: COLONOSCOPY;  Surgeon: Arta Silence, MD;  Location: [facility] ENDOSCOPY;  Service: Endoscopy;  Laterality: Left;  . COLONOSCOPY N/A 04/20/2014   Procedure: COLONOSCOPY;  Surgeon: Lear Ng, MD;  Location: [facility] ENDOSCOPY;  Service: Endoscopy;  Laterality: N/A;  . COLONOSCOPY W/ BIOPSIES AND POLYPECTOMY     benign  . DIRECT LARYNGOSCOPY  01/05/2014  . DIRECT LARYNGOSCOPY N/A 01/05/2014   Procedure: DIRECT LARYNGOSCOPY AND BIOPSY;  Surgeon: [name], MD;  Location: [place];  Service: ENT;  Laterality: N/A;  . KNEE ARTHROSCOPY Right ~ 1994  . LEFT AND RIGHT HEART CATHETERIZATION WITH CORONARY/GRAFT ANGIOGRAM N/A 12/31/2010   Procedure: LEFT AND RIGHT  HEART CATHETERIZATION WITH Beatrix Fetters;  Surgeon: Laverda Page, MD;  Location: [facility] CATH LAB;  Service: Cardiovascular;  Laterality: N/A;  . MULTIPLE EXTRACTIONS WITH ALVEOLOPLASTY N/A 07/08/2013   Procedure: Extraction of tooth #'s 22, 27 with alveoloplasty and bilateal mandibular facial exostoses reductions;  Surgeon: Lenn Cal, DDS;  Location: [facility] ORS;  Service: Oral Surgery;  Laterality: N/A;  . MULTIPLE TOOTH EXTRACTIONS  ~ 2008    ALLERGIES/ADVERSE DRUG REACTIONS: Allergies  Allergen Reactions  . Advil [Ibuprofen]     Feels "jittery"  . Azilsartan Swelling    Avoid ARB and ACEI per MD    MEDICATIONS: Current Outpatient Prescriptions  Medication Sig Dispense Refill  . aspirin 81 MG tablet Take 81 mg by mouth every other day.    . carvedilol (COREG) 12.5 MG tablet Take 12.5 mg by mouth 2 (two) times daily with a meal.    . Cholecalciferol (VITAMIN D-3 PO) Take 1,000 Units by mouth daily. Reported on 06/22/2015    . diphenhydrAMINE (BENADRYL) 25 MG tablet Take 1 tablet (25 mg total) by mouth every 6 (six) hours. 20 tablet 0  . NIFEdipine (ADALAT CC) 90 MG 24 hr tablet Take 90 mg by mouth daily.    [name] oxyCODONE-acetaminophen (PERCOCET) 10-325 MG per tablet Take 1 tablet by mouth every 6 (six) hours as needed for pain. 120 tablet 0  . polyethylene glycol (MIRALAX / GLYCOLAX) packet Take 17 g by mouth daily. 14 each 0  . spironolactone (ALDACTONE) 25 MG tablet Take 50 mg by mouth  once.     . tadalafil (CIALIS) 20 MG tablet Take 20 mg by mouth daily as needed for erectile dysfunction. Reported on 06/22/2015     No current facility-administered medications for this visit.     C/C: Patient is complaining of some denture irritation to the mandibular alveolar ridges.   HPI:  Patient has a history of squamous cell carcinoma of the right tonsil. Patient had extraction of remaining teeth with alveoloplasty and lateral exostoses reductions on 07/06/2013. Patient then  underwent radiation therapy from 07/26/2013 through 09/15/2013. Patient had upper and lower complete dentures fabricated and were inserted on 11/08/2014. Patient has been seen for multiple denture adjustment appointment since that time.  DENTAL EXAM: General: Patient well-developed, well-nourished male in no acute distress. Vitals: BP (!) 141/87 (BP Location: Left Arm)   Pulse 66   Temp 98.3 F (36.8 C) (Oral)  Extraoral Exam: There is no palpable lymphadenopathy.  There are no TMJ Symptoms. Intraoral  Exam: Patient has xerostomia. There is no evidence of denture irritation. Dentition: She is edentulous. The of the edentulous alveolar ridges. Prosthodontic: Patient has upper lower complete dentures are stable and retentive. Pressure indicating paste was applied to the upper lower dentures. Adjustments were made as needed. Dentures were polished. Occlusion:  Patient was evaluated and centric relation and protrusive strokes. No adjustments were made.  Patient tolerated the procedure well.   Assessments: 1. Patient is edentulous. 2. There is atrophy of the edentulous alveolar ridges. 3. Patient has xerostomia. 4. Stable and retentive upper and lower dentures.  Plan:  1. Keep dentures out if sore spots arise. Use salt water rinses as needed aid healing. 2. Return to clinic for denture adjustment as scheduled. Call if problems arise before then.   Lenn Cal, DDS

## 2015-10-29 NOTE — Patient Instructions (Signed)
Plan:  1. Keep dentures out if sore spots arise. Use salt water rinses as needed aid healing. 2. Return to clinic for denture adjustment as scheduled. Call if problems arise before then.   Ronald F. Kulinski, DDS  

## 2015-12-27 ENCOUNTER — Ambulatory Visit: Payer: Self-pay | Admitting: Pulmonary Disease

## 2016-02-22 ENCOUNTER — Ambulatory Visit (INDEPENDENT_AMBULATORY_CARE_PROVIDER_SITE_OTHER): Payer: Medicare Other | Admitting: Pulmonary Disease

## 2016-02-22 ENCOUNTER — Encounter: Payer: Self-pay | Admitting: Pulmonary Disease

## 2016-02-22 VITALS — BP 138/82 | HR 70 | Ht 73.0 in | Wt 324.0 lb

## 2016-02-22 DIAGNOSIS — Z6841 Body Mass Index (BMI) 40.0 and over, adult: Secondary | ICD-10-CM

## 2016-02-22 DIAGNOSIS — G4733 Obstructive sleep apnea (adult) (pediatric): Secondary | ICD-10-CM | POA: Diagnosis not present

## 2016-02-22 DIAGNOSIS — Z9989 Dependence on other enabling machines and devices: Secondary | ICD-10-CM | POA: Diagnosis not present

## 2016-02-22 NOTE — Patient Instructions (Signed)
Follow up in 1 year.

## 2016-02-22 NOTE — Progress Notes (Signed)
Current Outpatient Prescriptions on File Prior to Visit  Medication Sig  . aspirin 81 MG tablet Take 81 mg by mouth every other day.  . Cholecalciferol (VITAMIN D-3 PO) Take 1,000 Units by mouth daily. Reported on 06/22/2015  . diphenhydrAMINE (BENADRYL) 25 MG tablet Take 1 tablet (25 mg total) by mouth every 6 (six) hours.  [name] NIFEdipine (ADALAT CC) 90 MG 24 hr tablet Take 90 mg by mouth daily.  [name] oxyCODONE-acetaminophen (PERCOCET) 10-325 MG per tablet Take 1 tablet by mouth every 6 (six) hours as needed for pain.  . polyethylene glycol (MIRALAX / GLYCOLAX) packet Take 17 g by mouth daily.  [name] spironolactone (ALDACTONE) 25 MG tablet Take 50 mg by mouth once.    No current facility-administered medications on file prior to visit.     Chief Complaint  Patient presents with  . Follow-up    Pt has not worn CPAP x 1 month d/t traveling. Pt states that he has been sleeping okay without it. Pt states that while not wearing the CPAP he wakes up more frequently to go to the restroom and stays up later at night.     Sleep tests PSG 12/01/11 >> AHI 43 CPAP 06/02/14 to 07/01/14 >> used on 26 of 30 nights with average 6 hrs and 51 min.  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 male with OSA.  He was caring for his sister after she had a stroke.  As a result he had trouble keeping up with his own health.  Unfortunately, she passed away.  He was travelling with family and friends after this, and recently returned to [facility].  He will resume using CPAP.  He notices he uses the bathroom more at night w/o CPAP, and has more trouble  falling/staying asleep.  Physical Exam:  General - No distress ENT - No sinus tenderness, no oral exudate, no LAN, wears dentures Cardiac - s1s2 regular, no murmur Chest - No wheeze/rales/dullness Back - No focal tenderness Abd - Soft, non-tender Ext - No edema Neuro - Normal strength Skin - No rashes Psych - normal mood, and behavior   Assessment/Plan:  Obstructive sleep apnea. - continue auto CPAP   Morbid obesity. - encouraged him to keep up with his weight loss efforts   Patient Instructions  Follow up in 1 year    Chesley Mires, MD [place] [facility]/Sleep Pager:  [phone] 02/22/2016, 11:40 AM

## 2016-03-26 ENCOUNTER — Telehealth: Payer: Self-pay | Admitting: *Deleted

## 2016-03-26 NOTE — Telephone Encounter (Signed)
Oncology Nurse Navigator Documentation  Spoke with Melvin Hudson, informed him his survivorship and lab appts for tomorrow will be rescheduled.  He voiced understanding.  Gayleen Orem, RN, BSN, [place] [facility] Oncology Nurse [place] at [place] [phone]

## 2016-03-27 ENCOUNTER — Other Ambulatory Visit: Payer: Self-pay

## 2016-03-27 ENCOUNTER — Encounter: Payer: Self-pay | Admitting: Adult Health

## 2016-04-07 NOTE — Progress Notes (Signed)
Mr. Goicoechea presents for follow up of radiation to his Right Tonsil and bilateral neck completed 09/15/2013.  Pain issues, if any: He denies Using a feeding tube?: No Weight changes, if any: He reports that his weight is increasing, but he is not eating a lot.  Wt Readings from Last 3 Encounters:  04/11/16 (!) 323 lb 9.6 oz (146.8 kg)  02/22/16 (!) 324 lb (147 kg)  06/22/15 (!) 317 lb 8 oz (144 kg)   Swallowing issues, if any: He denies Smoking or chewing tobacco?  No Using fluoride trays daily? No Last ENT visit was on: Dr. Constance Holster last 11/04/15 Other notable issues, if any:   BP (!) 140/102   Pulse 83   Temp 98.1 F (36.7 C)   Ht 6\' 1"  (1.854 m)   Wt (!) 323 lb 9.6 oz (146.8 kg)   SpO2 100% Comment: room air  BMI 42.69 kg/m

## 2016-04-11 ENCOUNTER — Ambulatory Visit
Admission: RE | Admit: 2016-04-11 | Discharge: 2016-04-11 | Disposition: A | Discharge status: Home / Self Care | Payer: Medicare Other | Source: Ambulatory Visit | Attending: Radiation Oncology | Admitting: Radiation Oncology

## 2016-04-11 ENCOUNTER — Ambulatory Visit (HOSPITAL_BASED_OUTPATIENT_CLINIC_OR_DEPARTMENT_OTHER)
Admission: RE | Admit: 2016-04-11 | Discharge: 2016-04-11 | Disposition: A | Discharge status: Home / Self Care | Payer: Medicare Other | Source: Ambulatory Visit | Attending: Radiation Oncology | Admitting: Radiation Oncology

## 2016-04-11 VITALS — BP 140/102 | HR 83 | Temp 98.1°F | Ht 73.0 in | Wt 323.6 lb

## 2016-04-11 DIAGNOSIS — Z79899 Other long term (current) drug therapy: Secondary | ICD-10-CM | POA: Diagnosis not present

## 2016-04-11 DIAGNOSIS — R5383 Other fatigue: Secondary | ICD-10-CM

## 2016-04-11 DIAGNOSIS — Z923 Personal history of irradiation: Secondary | ICD-10-CM | POA: Insufficient documentation

## 2016-04-11 DIAGNOSIS — C090 Malignant neoplasm of tonsillar fossa: Secondary | ICD-10-CM | POA: Diagnosis not present

## 2016-04-11 DIAGNOSIS — Z886 Allergy status to analgesic agent status: Secondary | ICD-10-CM | POA: Insufficient documentation

## 2016-04-11 DIAGNOSIS — Z7982 Long term (current) use of aspirin: Secondary | ICD-10-CM | POA: Diagnosis not present

## 2016-04-11 DIAGNOSIS — R635 Abnormal weight gain: Secondary | ICD-10-CM

## 2016-04-11 DIAGNOSIS — C099 Malignant neoplasm of tonsil, unspecified: Secondary | ICD-10-CM

## 2016-04-11 MED ORDER — FLUCONAZOLE 100 MG PO TABS: ORAL_TABLET | ORAL | 0 refills | Status: DC

## 2016-04-11 NOTE — Progress Notes (Signed)
Radiation Oncology         [id number]) [id number] ________________________________  Name: Melvin Hudson MRN: [id number]  Date: 04/11/2016  DOB: 10-07-1955  Follow-Up Visit Note  Outpatient  CC: Kristine Garbe, MD  [name], MD    ICD-9-CM ICD-10-CM   1. 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[facility]) 146.1 C09.0   2. 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  On review of systems, the patient denies pain. The patient is not using a feeding tube. He reports gaining some weight, though he is still not eating a lot. He denies swallowing issues. He is not using tobacco products. He is not using daily fluoride trays - edentulous. The patient's last ENT visit with Dr. Constance Holster was 11/04/15.   ALLERGIES:  is allergic to advil [ibuprofen] and azilsartan.  Meds: Current Outpatient Prescriptions  Medication Sig Dispense Refill  . allopurinol (ZYLOPRIM) 300 MG tablet Take 300 mg by mouth daily.    [name] aspirin 81 MG tablet Take 81 mg by mouth every other day.    . carvedilol (COREG) 25 MG tablet Take 25 mg by mouth 2 (two) times daily with a meal.    . Cholecalciferol (VITAMIN D-3 PO) Take 1,000 Units by mouth daily. Reported on 06/22/2015    . colchicine 0.6 MG tablet Take 0.6 mg by mouth daily as needed.    . diphenhydrAMINE (BENADRYL) 25 MG tablet Take 1 tablet (25 mg total) by mouth every 6 (six) hours. 20 tablet 0  . eplerenone (INSPRA) 50 MG tablet Take 50 mg by mouth daily.    [name] NIFEdipine (ADALAT CC) 90 MG 24 hr tablet Take 90 mg by mouth daily.    [name] oxyCODONE-acetaminophen (PERCOCET) 10-325 MG per tablet Take 1 tablet  by mouth every 6 (six) hours as needed for pain. 120 tablet 0  . sildenafil (REVATIO) 20 MG tablet Take 20 mg by mouth as needed.    . fluconazole (DIFLUCAN) 100 MG tablet Take 2 tabs today and one tablet daily thereafter. Stop if you need to take colchicine. 6 tablet 0  . phentermine 30 MG capsule Take 30 mg by mouth every morning.    . polyethylene glycol (MIRALAX / GLYCOLAX) packet Take 17 g by mouth daily. (Patient not taking: Reported on 04/11/2016) 14 each 0  . spironolactone (ALDACTONE) 25 MG tablet Take 50 mg by mouth once.      No current facility-administered medications for this encounter.     Physical Findings:  height is 6\' 1"  (1.854 m) and weight is 323 lb 9.6 oz (146.8 kg) (abnormal). His temperature is 98.1 F (36.7 C). His blood pressure is 140/102 (abnormal) and his pulse is 83. His oxygen saturation is 100%.   General: Alert and oriented, in no acute distress.  HEENT: Edentulous. The patient removed his dentures for the oral exam. He appears to have oral thrush but no sign of cancer in the oral cavity or oropharynx. Neck: Neck is supple, no palpable cervical or supraclavicular lymphadenopathy or masses. Heart: Regular in rate and rhythm. There is a soft systolic murmur through the pericardium. Chest: Clear to auscultation bilaterally. Lymphatics: see  Neck Exam  Lab Findings: Lab Results  Component Value Date   WBC 4.8 04/20/2014   HGB 8.5 (L) 04/20/2014   HCT 26.2 (L) 04/20/2014   MCV 86.2 04/20/2014   PLT 215 04/20/2014   Lab Results  Component Value Date   TSH 1.076 06/22/2015   Radiographic Findings: No results found.  Impression/Plan:  No evidence of disease recurrence at this time.  I will prescribe Fluconazole for the patient's thrush.  The patient will follow up with Dr. Constance Holster in 6 months - will ask him to obtain TSH at that time. He will follow up with me in 1 year.   _____________________________________   Eppie Gibson, MD   This document  serves as a record of services personally performed by Eppie Gibson, MD. It was created on her behalf by Maryla Morrow, a trained medical scribe. The creation of this record is based on the scribe's personal observations and the provider's statements to them. This document has been checked and approved by the attending provider.

## 2016-04-13 ENCOUNTER — Other Ambulatory Visit: Payer: Self-pay | Admitting: Radiation Oncology

## 2016-04-13 DIAGNOSIS — C090 Malignant neoplasm of tonsillar fossa: Secondary | ICD-10-CM

## 2016-04-13 DIAGNOSIS — Z1329 Encounter for screening for other suspected endocrine disorder: Secondary | ICD-10-CM

## 2016-04-13 DIAGNOSIS — R635 Abnormal weight gain: Secondary | ICD-10-CM

## 2016-04-14 ENCOUNTER — Telehealth: Payer: Self-pay | Admitting: *Deleted

## 2016-04-14 LAB — TSH: TSH: 1.089 m[IU]/L (ref 0.320–4.118)

## 2016-04-14 NOTE — Telephone Encounter (Signed)
CALLED PATIENT TO INFORM OF LAB AND FU ON 04/10/17 , SPOKE WITH PATIENT AND HE IS AWARE OF THESE APPTS.

## 2016-04-14 NOTE — Telephone Encounter (Signed)
Oncology Nurse Navigator Documentation  Per Dr. Pearlie Oyster guidance, called [facility] ENT to arrange appointment.  Spoke with [employer]. Requested patient be contacted and routine post-RT follow-up appt arranged with Dr. Constance Holster for late August, to include TSH. She verbalized understanding.  Gayleen Orem, RN, BSN, [place] [facility] Oncology Nurse [place] at [place] [phone]

## 2016-05-12 ENCOUNTER — Ambulatory Visit (HOSPITAL_COMMUNITY): Payer: Medicaid - Dental | Admitting: Dentistry

## 2016-05-12 ENCOUNTER — Encounter (HOSPITAL_COMMUNITY): Payer: Self-pay | Admitting: Dentistry

## 2016-05-12 VITALS — BP 140/88 | HR 72 | Temp 98.2°F

## 2016-05-12 DIAGNOSIS — K08109 Complete loss of teeth, unspecified cause, unspecified class: Secondary | ICD-10-CM

## 2016-05-12 DIAGNOSIS — K000 Anodontia: Secondary | ICD-10-CM

## 2016-05-12 DIAGNOSIS — K117 Disturbances of salivary secretion: Secondary | ICD-10-CM

## 2016-05-12 DIAGNOSIS — Z5189 Encounter for other specified aftercare: Secondary | ICD-10-CM | POA: Diagnosis not present

## 2016-05-12 DIAGNOSIS — C099 Malignant neoplasm of tonsil, unspecified: Secondary | ICD-10-CM

## 2016-05-12 DIAGNOSIS — R682 Dry mouth, unspecified: Secondary | ICD-10-CM

## 2016-05-12 DIAGNOSIS — Z463 Encounter for fitting and adjustment of dental prosthetic device: Secondary | ICD-10-CM

## 2016-05-12 DIAGNOSIS — Z923 Personal history of irradiation: Secondary | ICD-10-CM

## 2016-05-12 NOTE — Progress Notes (Signed)
05/12/2016  Patient Name:   Melvin Hudson Date of Birth:   Sep 11, 1955 Medical Record Number: [id number]  BP 140/88   Pulse 72   Temp 98.2 F (36.8 C)   Erubiel Hudson is a 61 year old male that presents for periodic oral exam and evaluation of dentures. Patient has a history of squamous cell carcinoma of the right tonsil. Patient had extraction of remaining teeth with alveoloplasty and lateral exostoses reductions on 07/06/2013. Patient then underwent radiation therapy from 07/26/2013 through 09/15/2013. Patient had upper and lower complete dentures fabricated and were inserted on 11/08/2014. Patient has been seen for multiple denture adjustment appointment since that time. The patient now presents for periodic oral examination and denture recall.  Medical Hx Update:  Past Medical History:  Diagnosis Date  . Angioedema 09/17/11   tongue and lips  . Arthritis    "both of my legs and feet" (01/05/2014)  . Hx of gout   . Hypertension   . Obesity   . OSA on CPAP   . S/P radiation therapy 07/26/2013-09/15/2013   Right tonsil/bilateral neck/ 7000 cGy  . Squamous cell carcinoma of right tonsil ([place]) 06/14/13  .  Past Surgical History:  Procedure Laterality Date  . COLONOSCOPY Left 04/11/2014   Procedure: COLONOSCOPY;  Surgeon: Arta Silence, MD;  Location: [facility] ENDOSCOPY;  Service: Endoscopy;  Laterality: Left;  . COLONOSCOPY N/A 04/20/2014   Procedure: COLONOSCOPY;  Surgeon: Lear Ng, MD;  Location: [facility] ENDOSCOPY;  Service: Endoscopy;  Laterality: N/A;  . COLONOSCOPY W/ BIOPSIES AND POLYPECTOMY     benign  . DIRECT LARYNGOSCOPY  01/05/2014  . DIRECT LARYNGOSCOPY N/A 01/05/2014   Procedure: DIRECT LARYNGOSCOPY AND BIOPSY;  Surgeon: [name], MD;  Location: [facility];  Service: ENT;  Laterality: N/A;  . KNEE ARTHROSCOPY Right ~ 1994  . LEFT AND RIGHT HEART CATHETERIZATION WITH CORONARY/GRAFT ANGIOGRAM N/A 12/31/2010   Procedure: LEFT AND RIGHT HEART CATHETERIZATION WITH  Beatrix Fetters;  Surgeon: Laverda Page, MD;  Location: [facility] CATH LAB;  Service: Cardiovascular;  Laterality: N/A;  . MULTIPLE EXTRACTIONS WITH ALVEOLOPLASTY N/A 07/08/2013   Procedure: Extraction of tooth #'s 22, 27 with alveoloplasty and bilateal mandibular facial exostoses reductions;  Surgeon: Lenn Cal, DDS;  Location: [facility] ORS;  Service: Oral Surgery;  Laterality: N/A;  . MULTIPLE TOOTH EXTRACTIONS  ~ 2008    ALLERGIES/ADVERSE DRUG REACTIONS: Allergies  Allergen Reactions  . Advil [Ibuprofen]     Feels "jittery"  . Azilsartan Swelling    Avoid ARB and ACEI per MD    MEDICATIONS: Current Outpatient Prescriptions  Medication Sig Dispense Refill  . allopurinol (ZYLOPRIM) 300 MG tablet Take 300 mg by mouth daily.    [name] aspirin 81 MG tablet Take 81 mg by mouth every other day.    . carvedilol (COREG) 25 MG tablet Take 25 mg by mouth 2 (two) times daily with a meal.    . Cholecalciferol (VITAMIN D-3 PO) Take 1,000 Units by mouth daily. Reported on 06/22/2015    . colchicine 0.6 MG tablet Take 0.6 mg by mouth daily as needed.    . diphenhydrAMINE (BENADRYL) 25 MG tablet Take 1 tablet (25 mg total) by mouth every 6 (six) hours. 20 tablet 0  . eplerenone (INSPRA) 50 MG tablet Take 50 mg by mouth daily.    . fluconazole (DIFLUCAN) 100 MG tablet Take 2 tabs today and one tablet daily thereafter. Stop if you need to take colchicine. 6 tablet 0  . NIFEdipine (ADALAT CC)  90 MG 24 hr tablet Take 90 mg by mouth daily.    [name] oxyCODONE-acetaminophen (PERCOCET) 10-325 MG per tablet Take 1 tablet by mouth every 6 (six) hours as needed for pain. 120 tablet 0  . phentermine 30 MG capsule Take 30 mg by mouth every morning.    . polyethylene glycol (MIRALAX / GLYCOLAX) packet Take 17 g by mouth daily. (Patient not taking: Reported on 04/11/2016) 14 each 0  . sildenafil (REVATIO) 20 MG tablet Take 20 mg by mouth as needed.    [name] spironolactone (ALDACTONE) 25 MG tablet Take 50 mg by mouth once.       No current facility-administered medications for this visit.     C/C: Patient is not complaining of any denture irritation.   HPI:  Patient has a history of squamous cell carcinoma of the right tonsil. Patient had extraction of remaining teeth with alveoloplasty and lateral exostoses reductions on 07/06/2013. Patient then underwent radiation therapy from 07/26/2013 through 09/15/2013. Patient had upper and lower complete dentures fabricated and were inserted on 11/08/2014. Patient has been seen for multiple denture adjustment appointment since that time.  DENTAL EXAM: General: Patient well-developed, well-nourished male in no acute distress. Vitals: BP 140/88   Pulse 72   Temp 98.2 F (36.8 C)  Extraoral Exam: There is no palpable lymphadenopathy.  There are no TMJ Symptoms. Intraoral  Exam: Patient has xerostomia. There is no evidence of denture irritation. Dentition: The patient is edentulous. There is atrophy of the edentulous alveolar ridges. Prosthodontic: Patient has upper and lower complete dentures that are stable and retentive. Pressure indicating paste was applied to the upper and lower dentures. Adjustments were not needed. Occlusion:  Patient was evaluated in centric relation and protrusive strokes. No adjustments were made.  Patient tolerated the procedure well.   Assessments: 1. Patient is edentulous. 2. There is atrophy of the edentulous alveolar ridges. 3. Patient has xerostomia. 4. Stable and retentive upper and lower dentures.  Plan:  1. Keep dentures out if sore spots arise. Use salt water rinses as needed aid healing. 2. Return to clinic for denture adjustment as scheduled. Call if problems arise before then.   Lenn Cal, DDS

## 2016-05-12 NOTE — Patient Instructions (Signed)
Plan:  1. Keep dentures out if sore spots arise. Use salt water rinses as needed aid healing. 2. Return to clinic for denture adjustment as scheduled. Call if problems arise before then.   Tajon Moring F. Sophea Rackham, DDS  

## 2016-08-30 ENCOUNTER — Inpatient Hospital Stay (HOSPITAL_COMMUNITY)
Admission: EM | Admit: 2016-08-30 | Discharge: 2016-09-03 | DRG: 378 | Disposition: A | Discharge status: Home / Self Care | Payer: Medicare Other | Attending: Internal Medicine | Admitting: Internal Medicine

## 2016-08-30 ENCOUNTER — Encounter (HOSPITAL_COMMUNITY): Payer: Self-pay | Admitting: Emergency Medicine

## 2016-08-30 DIAGNOSIS — R110 Nausea: Secondary | ICD-10-CM | POA: Diagnosis present

## 2016-08-30 DIAGNOSIS — M19071 Primary osteoarthritis, right ankle and foot: Secondary | ICD-10-CM | POA: Diagnosis present

## 2016-08-30 DIAGNOSIS — G4733 Obstructive sleep apnea (adult) (pediatric): Secondary | ICD-10-CM | POA: Diagnosis present

## 2016-08-30 DIAGNOSIS — Z6841 Body Mass Index (BMI) 40.0 and over, adult: Secondary | ICD-10-CM

## 2016-08-30 DIAGNOSIS — K648 Other hemorrhoids: Secondary | ICD-10-CM | POA: Diagnosis present

## 2016-08-30 DIAGNOSIS — Z7982 Long term (current) use of aspirin: Secondary | ICD-10-CM | POA: Diagnosis not present

## 2016-08-30 DIAGNOSIS — Z886 Allergy status to analgesic agent status: Secondary | ICD-10-CM | POA: Diagnosis not present

## 2016-08-30 DIAGNOSIS — K922 Gastrointestinal hemorrhage, unspecified: Secondary | ICD-10-CM | POA: Diagnosis present

## 2016-08-30 DIAGNOSIS — Z79899 Other long term (current) drug therapy: Secondary | ICD-10-CM

## 2016-08-30 DIAGNOSIS — Z888 Allergy status to other drugs, medicaments and biological substances status: Secondary | ICD-10-CM | POA: Diagnosis not present

## 2016-08-30 DIAGNOSIS — Z87891 Personal history of nicotine dependence: Secondary | ICD-10-CM | POA: Diagnosis not present

## 2016-08-30 DIAGNOSIS — E6609 Other obesity due to excess calories: Secondary | ICD-10-CM

## 2016-08-30 DIAGNOSIS — K5731 Diverticulosis of large intestine without perforation or abscess with bleeding: Secondary | ICD-10-CM | POA: Diagnosis present

## 2016-08-30 DIAGNOSIS — M1990 Unspecified osteoarthritis, unspecified site: Secondary | ICD-10-CM | POA: Diagnosis present

## 2016-08-30 DIAGNOSIS — I10 Essential (primary) hypertension: Secondary | ICD-10-CM | POA: Diagnosis present

## 2016-08-30 DIAGNOSIS — Z9989 Dependence on other enabling machines and devices: Secondary | ICD-10-CM | POA: Diagnosis not present

## 2016-08-30 DIAGNOSIS — Z79891 Long term (current) use of opiate analgesic: Secondary | ICD-10-CM

## 2016-08-30 DIAGNOSIS — Z923 Personal history of irradiation: Secondary | ICD-10-CM

## 2016-08-30 DIAGNOSIS — M109 Gout, unspecified: Secondary | ICD-10-CM | POA: Diagnosis present

## 2016-08-30 DIAGNOSIS — R1011 Right upper quadrant pain: Secondary | ICD-10-CM | POA: Diagnosis present

## 2016-08-30 DIAGNOSIS — K625 Hemorrhage of anus and rectum: Secondary | ICD-10-CM | POA: Diagnosis present

## 2016-08-30 DIAGNOSIS — M19072 Primary osteoarthritis, left ankle and foot: Secondary | ICD-10-CM | POA: Diagnosis present

## 2016-08-30 DIAGNOSIS — Z8719 Personal history of other diseases of the digestive system: Secondary | ICD-10-CM

## 2016-08-30 DIAGNOSIS — E662 Morbid (severe) obesity with alveolar hypoventilation: Secondary | ICD-10-CM | POA: Diagnosis not present

## 2016-08-30 LAB — CBC
HEMATOCRIT: 37.6 % — AB (ref 39.0–52.0)
Hemoglobin: 13 g/dL (ref 13.0–17.0)
MCH: 28.4 pg (ref 26.0–34.0)
MCHC: 34.6 g/dL (ref 30.0–36.0)
MCV: 82.1 fL (ref 78.0–100.0)
PLATELETS: 242 10*3/uL (ref 150–400)
RBC: 4.58 MIL/uL (ref 4.22–5.81)
RDW: 18.9 % — ABNORMAL HIGH (ref 11.5–15.5)
WBC: 6.8 10*3/uL (ref 4.0–10.5)

## 2016-08-30 LAB — COMPREHENSIVE METABOLIC PANEL
ALT: 21 U/L (ref 17–63)
ANION GAP: 8 (ref 5–15)
AST: 21 U/L (ref 15–41)
Albumin: 4.3 g/dL (ref 3.5–5.0)
Alkaline Phosphatase: 145 U/L — ABNORMAL HIGH (ref 38–126)
BUN: 5 mg/dL — ABNORMAL LOW (ref 6–20)
CHLORIDE: 102 mmol/L (ref 101–111)
CO2: 29 mmol/L (ref 22–32)
Calcium: 9.5 mg/dL (ref 8.9–10.3)
Creatinine, Ser: 1.01 mg/dL (ref 0.61–1.24)
Glucose, Bld: 156 mg/dL — ABNORMAL HIGH (ref 65–99)
POTASSIUM: 3.4 mmol/L — AB (ref 3.5–5.1)
Sodium: 139 mmol/L (ref 135–145)
Total Bilirubin: 0.9 mg/dL (ref 0.3–1.2)
Total Protein: 7.1 g/dL (ref 6.5–8.1)

## 2016-08-30 LAB — PROTIME-INR
INR: 1.12
Prothrombin Time: 14.4 seconds (ref 11.4–15.2)

## 2016-08-30 LAB — POC OCCULT BLOOD, ED: FECAL OCCULT BLD: POSITIVE — AB

## 2016-08-30 MED ORDER — PANTOPRAZOLE SODIUM 40 MG PO TBEC
40.0000 mg | DELAYED_RELEASE_TABLET | Freq: Every day | ORAL | Status: DC
  Administered 2016-08-31: 40 mg via ORAL
  Filled 2016-08-30: qty 1

## 2016-08-30 MED ORDER — CALCIUM CITRATE-VITAMIN D 500-500 MG-UNIT PO CHEW: 1.0000 | CHEWABLE_TABLET | Freq: Two times a day (BID) | ORAL | Status: DC

## 2016-08-30 MED ORDER — CARVEDILOL 25 MG PO TABS: 25.0000 mg | ORAL_TABLET | Freq: Two times a day (BID) | ORAL | Status: DC

## 2016-08-30 MED ORDER — ACETAMINOPHEN 650 MG RE SUPP: 650.0000 mg | Freq: Four times a day (QID) | RECTAL | Status: DC | PRN

## 2016-08-30 MED ORDER — OXYCODONE-ACETAMINOPHEN 5-325 MG PO TABS
1.0000 | ORAL_TABLET | Freq: Four times a day (QID) | ORAL | Status: DC | PRN
  Administered 2016-09-02 – 2016-09-03 (×2): 1 via ORAL
  Filled 2016-08-30 (×2): qty 1

## 2016-08-30 MED ORDER — OXYCODONE HCL 5 MG PO TABS
5.0000 mg | ORAL_TABLET | Freq: Four times a day (QID) | ORAL | Status: DC | PRN
  Administered 2016-09-02 – 2016-09-03 (×2): 5 mg via ORAL
  Filled 2016-08-30 (×2): qty 1

## 2016-08-30 MED ORDER — ONDANSETRON HCL 4 MG PO TABS: 4.0000 mg | ORAL_TABLET | Freq: Four times a day (QID) | ORAL | Status: DC | PRN

## 2016-08-30 MED ORDER — NIFEDIPINE ER OSMOTIC RELEASE 90 MG PO TB24: 90.0000 mg | ORAL_TABLET | Freq: Every day | ORAL | Status: DC

## 2016-08-30 MED ORDER — ACETAMINOPHEN 325 MG PO TABS: 650.0000 mg | ORAL_TABLET | Freq: Four times a day (QID) | ORAL | Status: DC | PRN

## 2016-08-30 MED ORDER — OXYCODONE-ACETAMINOPHEN 10-325 MG PO TABS: 1.0000 | ORAL_TABLET | Freq: Four times a day (QID) | ORAL | Status: DC | PRN

## 2016-08-30 MED ORDER — SODIUM CHLORIDE 0.9 % IV SOLN
INTRAVENOUS | Status: DC
  Administered 2016-08-31 – 2016-09-01 (×2): via INTRAVENOUS

## 2016-08-30 MED ORDER — ALLOPURINOL 300 MG PO TABS
300.0000 mg | ORAL_TABLET | Freq: Every day | ORAL | Status: DC
  Administered 2016-08-31 – 2016-09-03 (×4): 300 mg via ORAL
  Filled 2016-08-30 (×4): qty 1

## 2016-08-30 MED ORDER — ONDANSETRON HCL 4 MG/2ML IJ SOLN: 4.0000 mg | Freq: Four times a day (QID) | INTRAMUSCULAR | Status: DC | PRN

## 2016-08-30 NOTE — Progress Notes (Signed)
Just after finishing my H+P patient had another large bloody BM.  Will order STAT tagged RBC scan.  If positive then needs IR consult to attempt coiling.

## 2016-08-30 NOTE — ED Triage Notes (Signed)
Patient had large amount of blood in toilet after using bathroom in triage.

## 2016-08-30 NOTE — ED Provider Notes (Signed)
[facility] DEPT Provider Note   CSN: [id number] Arrival date & time: 08/30/16  1953     History   Chief Complaint Chief Complaint  Patient presents with  . GI Bleeding    HPI   Blood pressure 121/82, pulse 87, temperature 98.1 F (36.7 C), temperature source Oral, resp. rate 13, height 6\' 1"  (1.854 m), weight (!) 147 kg (324 lb), SpO2 96 %.  Melvin Hudson is a 61 y.o. male with past medical history significant for diverticulosis, hypertension complaining of 3x episodes of bright red blood per rectum starting this afternoon. He is not anticoagulated. He takes a daily low-dose aspirin. He does not drink alcohol regularly. He states the stool is becoming diarrhea-like and darker. He states that there is no bleeding when he is not having a bowel movement. He denies any chest pain, shortness of breath, palpitations syncope. He has a history of lower GI bleed. Was in 2016, he is not followed regularly with GI since his admission to the hospital. He states that he started feeling some upper abdominal discomfort with nausea and no vomiting.  Past Medical History:  Diagnosis Date  . Angioedema 09/17/11   tongue and lips  . Arthritis    "both of my legs and feet" (01/05/2014)  . Hx of gout   . Hypertension   . Obesity   . OSA on CPAP   . S/P radiation therapy 07/26/2013-09/15/2013   Right tonsil/bilateral neck/ 7000 cGy  . Squamous cell carcinoma of right tonsil ([place]) 06/14/13    Patient Active Problem List   Diagnosis Date Noted  . Chronic pain syndrome   . History of GI diverticular bleed 04/18/2014  . Essential hypertension   . Lower GI bleed   . Hematochezia 04/10/2014  . Symptomatic anemia 04/08/2014  . 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 Mouth pain 07/18/2013  . Weight loss 07/18/2013  . 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[place]) 06/29/2013  . OSA (obstructive sleep apnea) 11/19/2011  . Angio-edema 09/17/2011  . HTN (hypertension) 09/17/2011  . Gout 09/17/2011     Past Surgical History:  Procedure Laterality Date  . COLONOSCOPY Left 04/11/2014   Procedure: COLONOSCOPY;  Surgeon: Arta Silence, MD;  Location: [facility] ENDOSCOPY;  Service: Endoscopy;  Laterality: Left;  . COLONOSCOPY N/A 04/20/2014   Procedure: COLONOSCOPY;  Surgeon: Lear Ng, MD;  Location: [facility] ENDOSCOPY;  Service: Endoscopy;  Laterality: N/A;  . COLONOSCOPY W/ BIOPSIES AND POLYPECTOMY     benign  . DIRECT LARYNGOSCOPY  01/05/2014  . DIRECT LARYNGOSCOPY N/A 01/05/2014   Procedure: DIRECT LARYNGOSCOPY AND BIOPSY;  Surgeon: [name], MD;  Location: [facility];  Service: ENT;  Laterality: N/A;  . KNEE ARTHROSCOPY Right ~ 1994  . LEFT AND RIGHT HEART CATHETERIZATION WITH CORONARY/GRAFT ANGIOGRAM N/A 12/31/2010   Procedure: LEFT AND RIGHT HEART CATHETERIZATION WITH Beatrix Fetters;  Surgeon: Laverda Page, MD;  Location: [facility] CATH LAB;  Service: Cardiovascular;  Laterality: N/A;  . MULTIPLE EXTRACTIONS WITH ALVEOLOPLASTY N/A 07/08/2013   Procedure: Extraction of tooth #'s 22, 27 with alveoloplasty and bilateal mandibular facial exostoses reductions;  Surgeon: Lenn Cal, DDS;  Location: [facility] ORS;  Service: Oral Surgery;  Laterality: N/A;  . MULTIPLE TOOTH EXTRACTIONS  ~ 2008       Home Medications    Prior to Admission medications   Medication Sig Start Date End Date Taking? Authorizing Provider  allopurinol (ZYLOPRIM) 300 MG tablet Take 300 mg by mouth daily as needed.    Yes [provider]  aspirin 81 MG tablet Take 81 mg by mouth every other day.   Yes [provider]  calcium citrate-vitamin D 500-500 MG-UNIT chewable tablet Chew 1 tablet by mouth 2 (two) times daily.   Yes [provider]  carvedilol (COREG) 25 MG tablet Take 25 mg by mouth 2 (two) times daily with a meal.   Yes [provider]  cetirizine (ZYRTEC) 10 MG tablet Take 10 mg by mouth daily as needed. 06/06/16  Yes [provider]  eplerenone (INSPRA)  50 MG tablet Take 50 mg by mouth daily. 08/29/16  Yes [provider]  furosemide (LASIX) 40 MG tablet Take 40 mg by mouth 2 (two) times daily. 08/25/16  Yes [provider]  NIFEdipine (ADALAT CC) 90 MG 24 hr tablet Take 90 mg by mouth daily.   Yes [provider]  omeprazole (PRILOSEC) 20 MG capsule Take 20 mg by mouth as needed. 06/04/16  Yes [provider]  oxyCODONE-acetaminophen (PERCOCET) 10-325 MG per tablet Take 1 tablet by mouth every 6 (six) hours as needed for pain. Patient taking differently: Take 1 tablet by mouth 4 (four) times daily.  11/11/13  Yes Eppie Gibson, MD  polyethylene glycol [facility] Floria Raveling) packet Take 17 g by mouth daily. 04/20/14  Yes McKeag, Marylynn Pearson, MD  colchicine 0.6 MG tablet Take 0.6 mg by mouth daily as needed.    [provider]  diphenhydrAMINE (BENADRYL) 25 MG tablet Take 1 tablet (25 mg total) by mouth every 6 (six) hours. Patient taking differently: Take 25 mg by mouth every 6 (six) hours as needed.  10/29/13   Dorie Rank, MD  fluconazole (DIFLUCAN) 100 MG tablet Take 2 tabs today and one tablet daily thereafter. Stop if you need to take colchicine. Patient not taking: Reported on 08/30/2016 04/11/16   Eppie Gibson, MD  sildenafil (REVATIO) 20 MG tablet Take 20 mg by mouth as needed.    [provider]    Family History Family History  Problem Relation Age of Onset  . Cancer Sister        breast ca    Social History Social History  Substance Use Topics  . Smoking status: Former Smoker    Years: 42.00    Types: Cigarettes, Cigars    Quit date: 06/17/2013  . Smokeless tobacco: Never Used     Comment: "Quit smoking cigarettes in ~  2007"  . Alcohol use Yes     Comment: 01/05/2014 "sober since 1987"     Allergies   Advil [ibuprofen] and Azilsartan   Review of Systems Review of Systems  A complete review of systems was obtained and all systems are negative except as noted in the HPI and PMH.     Physical Exam Updated Vital Signs BP 117/79   Pulse 73   Temp 98.1 F (36.7 C) (Oral)   Resp 19   Ht 6\' 1"  (1.854 m)   Wt (!) 147 kg (324 lb)   SpO2 97%   BMI 42.75 kg/m   Physical Exam  Constitutional: He is oriented to person, place, and time. He appears well-developed and well-nourished. No distress.  Obese  HENT:  Head: Normocephalic and atraumatic.  Mouth/Throat: Oropharynx is clear and moist.  Eyes: Pupils are equal, round, and reactive to light. Conjunctivae and EOM are normal.  Neck: Normal range of motion.  Cardiovascular: Normal rate, regular rhythm and intact distal pulses.   Pulmonary/Chest: Effort normal and breath sounds normal.  Abdominal: Soft.  There is no tenderness.  Genitourinary:  Genitourinary Comments: Digital rectal exam a chaperoned by technician: No rashes or lesions, normal rectal tone, gross blood per rectum.    Musculoskeletal: Normal range of motion.  Neurological: He is alert and oriented to person, place, and time.  Skin: He is not diaphoretic.  Psychiatric: He has a normal mood and affect.  Nursing note and vitals reviewed.      ED Treatments / Results  Labs (all labs ordered are listed, but only abnormal results are displayed) Labs Reviewed  COMPREHENSIVE METABOLIC PANEL - Abnormal; Notable for the following:       Result Value   Potassium 3.4 (*)    Glucose, Bld 156 (*)    BUN 5 (*)    Alkaline Phosphatase 145 (*)    All other components within normal limits  CBC - Abnormal; Notable for the following:    HCT 37.6 (*)    RDW 18.9 (*)    All other components within normal limits  POC OCCULT BLOOD, ED - Abnormal; Notable for the following:    Fecal Occult Bld POSITIVE (*)    All other components within normal limits  PROTIME-INR  TYPE AND SCREEN    EKG  EKG Interpretation None       Radiology No results found.  Procedures Procedures (including critical care time)  Medications Ordered in ED Medications - No  data to display   Initial Impression / Assessment and Plan / ED Course  I have reviewed the triage vital signs and the nursing notes.  Pertinent labs & imaging results that were available during my care of the patient were reviewed by me and considered in my medical decision making (see chart for details).     Vitals:   08/30/16 2115 08/30/16 2130 08/30/16 2145 08/30/16 2200  BP: 114/82 123/79 116/83 117/79  Pulse: 73 74 72 73  Resp: (!) 23 (!) 25 19 19   Temp:      TempSrc:      SpO2: 98% 97% 96% 97%  Weight:      Height:        Melvin Hudson is 61 y.o. male presenting with Lower GI bleed onset this afternoon. She is not anemic. There is an impressive amount of gross blood in the bedside commode. Chart review shows admission for lower GI bleed in 3 of 2016 that required ICU stay and 2 units of packed red blood cells. Normal platelets. Patient will be admitted to [facility] hospitalist Dr. Alcario Drought. I have not received a call back from GI however he states that he will need a nuclear medicine test and this is lower GI bleed GI may not have significant resources to offer.   Final Clinical Impressions(s) / ED Diagnoses   Final diagnoses:  Lower GI bleed    New Prescriptions New Prescriptions   No medications on file     Waynetta Pean 08/30/16 2312    Lajean Saver, MD 08/31/16 1521

## 2016-08-30 NOTE — H&P (Signed)
History and Physical    Melvin Hudson XTA:[id number] DOB: [birth year] DOA: 08/30/2016  PCP: [name], MD  Patient coming from: Home  I have personally briefly reviewed patient's old medical records in [place]  Chief Complaint: BRBPR (see physical exam photo)  HPI: Melvin Hudson is a 61 y.o. male with medical history significant of Diverticular bleed in 2016 requiring multiple units transfusion, source never found per patient despite colonoscopy.  Patient presents to the ED with acute onset of BRBPR episodes this afternoon.  Symptoms are persistent, frequent, and constant.  No abd pain.  He describes "some" blood per rectum with his stool subjectively (objectively we are seeing ALL blood per rectum with clots, again see photo below).  ED Course: Last bleeding episode at 9pm.  HGB 13.  Guiac positive.  Vitals stable at this time.   Review of Systems: As per HPI otherwise 10 point review of systems negative.   Past Medical History:  Diagnosis Date  . Angioedema 09/17/11   tongue and lips  . Arthritis    "both of my legs and feet" (01/05/2014)  . Hx of gout   . Hypertension   . Obesity   . OSA on CPAP   . S/P radiation therapy 07/26/2013-09/15/2013   Right tonsil/bilateral neck/ 7000 cGy  . 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 COLONOSCOPY Left 04/11/2014   Procedure: COLONOSCOPY;  Surgeon: Arta Silence, MD;  Location: [facility] ENDOSCOPY;  Service: Endoscopy;  Laterality: Left;  . COLONOSCOPY N/A 04/20/2014   Procedure: COLONOSCOPY;  Surgeon: Lear Ng, MD;  Location[facility] ENDOSCOPY;  Service: Endoscopy;  Laterality: N/A;  . COLONOSCOPY W/ BIOPSIES AND POLYPECTOMY     benign  . DIRECT LARYNGOSCOPY  01/05/2014  . DIRECT LARYNGOSCOPY N/A 01/05/2014   Procedure: DIRECT LARYNGOSCOPY AND BIOPSY;  Surgeon: [name], MD;  Location: [facility];  Service: ENT;  Laterality: N/A;  . KNEE ARTHROSCOPY Right ~  1994  . LEFT AND RIGHT HEART CATHETERIZATION WITH CORONARY/GRAFT ANGIOGRAM N/A 12/31/2010   Procedure: LEFT AND RIGHT HEART CATHETERIZATION WITH Beatrix Fetters;  Surgeon: Laverda Page, MD;  Location: [facility] CATH LAB;  Service: Cardiovascular;  Laterality: N/A;  . MULTIPLE EXTRACTIONS WITH ALVEOLOPLASTY N/A 07/08/2013   Procedure: Extraction of tooth #'s 22, 27 with alveoloplasty and bilateal mandibular facial exostoses reductions;  Surgeon: Lenn Cal, DDS;  Location: [facility] ORS;  Service: Oral Surgery;  Laterality: N/A;  . MULTIPLE TOOTH EXTRACTIONS  ~ 2008     reports that he quit smoking about 3 years ago. His smoking use included Cigarettes and Cigars. He quit after 42.00 years of use. He has never used smokeless tobacco. He reports that he drinks alcohol. He reports that he does not use drugs.  Allergies  Allergen Reactions  . Advil [Ibuprofen]     Feels "jittery"  . Azilsartan Swelling    Avoid ARB and ACEI per MD    Family History  Problem Relation Age of Onset  . Cancer Sister        breast ca     Prior to Admission medications   Medication Sig Start Date End Date Taking? Authorizing Provider  allopurinol (ZYLOPRIM) 300 MG tablet Take 300 mg by mouth daily as needed.    Yes [provider]  aspirin 81 MG tablet Take 81 mg by mouth every other day.   Yes [provider]  calcium citrate-vitamin D 500-500 MG-UNIT chewable  tablet Chew 1 tablet by mouth 2 (two) times daily.   Yes [provider]  carvedilol (COREG) 25 MG tablet Take 25 mg by mouth 2 (two) times daily with a meal.   Yes [provider]  cetirizine (ZYRTEC) 10 MG tablet Take 10 mg by mouth daily as needed. 06/06/16  Yes [provider]  eplerenone (INSPRA) 50 MG tablet Take 50 mg by mouth daily. 08/29/16  Yes [provider]  furosemide (LASIX) 40 MG tablet Take 40 mg by mouth 2 (two) times daily. 08/25/16  Yes [provider]  NIFEdipine  (ADALAT CC) 90 MG 24 hr tablet Take 90 mg by mouth daily.   Yes [provider]  omeprazole (PRILOSEC) 20 MG capsule Take 20 mg by mouth as needed. 06/04/16  Yes [provider]  oxyCODONE-acetaminophen (PERCOCET) 10-325 MG per tablet Take 1 tablet by mouth every 6 (six) hours as needed for pain. Patient taking differently: Take 1 tablet by mouth 4 (four) times daily.  11/11/13  Yes Eppie Gibson, MD  polyethylene glycol [facility] / Floria Raveling) packet Take 17 g by mouth daily. 04/20/14  Yes McKeag, Marylynn Pearson, MD  colchicine 0.6 MG tablet Take 0.6 mg by mouth daily as needed.    [provider]  diphenhydrAMINE (BENADRYL) 25 MG tablet Take 1 tablet (25 mg total) by mouth every 6 (six) hours. Patient taking differently: Take 25 mg by mouth every 6 (six) hours as needed.  10/29/13   Dorie Rank, MD  sildenafil (REVATIO) 20 MG tablet Take 20 mg by mouth as needed.    [provider]    Physical Exam: Vitals:   08/30/16 2115 08/30/16 2130 08/30/16 2145 08/30/16 2200  BP: 114/82 123/79 116/83 117/79  Pulse: 73 74 72 73  Resp: (!) 23 (!) 25 19 19   Temp:      TempSrc:      SpO2: 98% 97% 96% 97%  Weight:      Height:          Constitutional: NAD, calm, comfortable Eyes: PERRL, lids and conjunctivae normal ENMT: Mucous membranes are moist. Posterior pharynx clear of any exudate or lesions.Normal dentition.  Neck: normal, supple, no masses, no thyromegaly Respiratory: clear to auscultation bilaterally, no wheezing, no crackles. Normal respiratory effort. No accessory muscle use.  Cardiovascular: Regular rate and rhythm, no murmurs / rubs / gallops. No extremity edema. 2+ pedal pulses. No carotid bruits.  Abdomen: no tenderness, no masses palpated. No hepatosplenomegaly. Bowel sounds positive.  Musculoskeletal: no clubbing / cyanosis. No joint deformity upper and lower extremities. Good ROM, no contractures. Normal muscle tone.  Skin: no rashes, lesions, ulcers. No  induration Neurologic: CN 2-12 grossly intact. Sensation intact, DTR normal. Strength 5/5 in all 4.  Psychiatric: Normal judgment and insight. Alert and oriented x 3. Normal mood.    Labs on Admission: I have personally reviewed following labs and imaging studies  CBC:  Recent Labs Lab 08/30/16 2014  WBC 6.8  HGB 13.0  HCT 37.6*  MCV 82.1  PLT 329   Basic Metabolic Panel:  Recent Labs Lab 08/30/16 2014  NA 139  K 3.4*  CL 102  CO2 29  GLUCOSE 156*  BUN 5*  CREATININE 1.01  CALCIUM 9.5   GFR: Estimated Creatinine Clearance: 114.4 mL/min (by C-G formula based on SCr of 1.01 mg/dL). Liver Function Tests:  Recent Labs Lab 08/30/16 2014  AST 21  ALT 21  ALKPHOS 145*  BILITOT 0.9  PROT 7.1  ALBUMIN  4.3   No results for input(s): LIPASE, AMYLASE in the last 168 hours. No results for input(s): AMMONIA in the last 168 hours. Coagulation Profile:  Recent Labs Lab 08/30/16 2249  INR 1.12   Cardiac Enzymes: No results for input(s): CKTOTAL, CKMB, CKMBINDEX, TROPONINI in the last 168 hours. BNP (last 3 results) No results for input(s): PROBNP in the last 8760 hours. HbA1C: No results for input(s): HGBA1C in the last 72 hours. CBG: No results for input(s): GLUCAP in the last 168 hours. Lipid Profile: No results for input(s): CHOL, HDL, LDLCALC, TRIG, CHOLHDL, LDLDIRECT in the last 72 hours. Thyroid Function Tests: No results for input(s): TSH, T4TOTAL, FREET4, T3FREE, THYROIDAB in the last 72 hours. Anemia Panel: No results for input(s): VITAMINB12, FOLATE, FERRITIN, TIBC, IRON, RETICCTPCT in the last 72 hours. Urine analysis: No results found for: COLORURINE, APPEARANCEUR, LABSPEC, PHURINE, GLUCOSEU, HGBUR, BILIRUBINUR, KETONESUR, PROTEINUR, UROBILINOGEN, NITRITE, LEUKOCYTESUR  Radiological Exams on Admission: No results found.  EKG: Independently reviewed.  Assessment/Plan Principal Problem:   BRBPR (bright red blood per rectum) Active Problems:    HTN (hypertension)   OSA (obstructive sleep apnea)   History of GI diverticular bleed    1. BRBPR - likely diverticular bleed 1. Repeat CBC at 0500 2. IVF: NS at 75 cc/hr 3. Hold diuretics 4. No further bleeding episode since 9pm and patient stable hemodynamically at this time. 5. If he re-bleeds then will need to get STAT NM tagged RBC scan tonight and if positive get IR to attempt embolization 6. Call GI again in AM. 2. HTN - continue beta blocker and CCB, hold diuretics 3. OSA - continue cpap  DVT prophylaxis: SCDs Code Status: Full Family Communication: No family in room Disposition Plan: Home after admit Consults called: None Admission status: Admit to inpatient - inpatient status for major GI bleed (see photo)   [place], [place] Pager [phone]  If 7AM-7PM, please contact day team taking care of patient [email or web address] Password [facility]  08/30/2016, 11:42 PM

## 2016-08-30 NOTE — ED Triage Notes (Signed)
Pt to ED from home c/o GI bleed - noticed dark and bright red blood in his stool this morning and has gone multiple times, denies N/V/D or fevers. Pt endorses generalized abd pain starting today and feeling "unwell." Pt reports he was admitted to the hospital last year for 13 days for same, having received blood transfusions. Skin warm/dry, resp e/u.

## 2016-08-31 ENCOUNTER — Inpatient Hospital Stay (HOSPITAL_COMMUNITY): Payer: Medicare Other

## 2016-08-31 DIAGNOSIS — K922 Gastrointestinal hemorrhage, unspecified: Secondary | ICD-10-CM

## 2016-08-31 LAB — BASIC METABOLIC PANEL
ANION GAP: 6 (ref 5–15)
ANION GAP: 7 (ref 5–15)
BUN: 6 mg/dL (ref 6–20)
BUN: 7 mg/dL (ref 6–20)
CALCIUM: 8.7 mg/dL — AB (ref 8.9–10.3)
CO2: 30 mmol/L (ref 22–32)
CO2: 28 mmol/L (ref 22–32)
CREATININE: 0.98 mg/dL (ref 0.61–1.24)
Calcium: 8.7 mg/dL — ABNORMAL LOW (ref 8.9–10.3)
Chloride: 103 mmol/L (ref 101–111)
Chloride: 104 mmol/L (ref 101–111)
Creatinine, Ser: 0.87 mg/dL (ref 0.61–1.24)
GFR calc Af Amer: >60 mL/min (ref >60)
GFR calc Af Amer: >60 mL/min (ref >60)
GFR calc non Af Amer: >60 mL/min (ref >60)
GLUCOSE: 154 mg/dL — AB (ref 65–99)
GLUCOSE: 129 mg/dL — AB (ref 65–99)
POTASSIUM: 3.4 mmol/L — AB (ref 3.5–5.1)
Potassium: 3.2 mmol/L — ABNORMAL LOW (ref 3.5–5.1)
Sodium: 139 mmol/L (ref 135–145)
Sodium: 139 mmol/L (ref 135–145)

## 2016-08-31 LAB — CBC
HCT: 34.1 % — ABNORMAL LOW (ref 39.0–52.0)
HEMOGLOBIN: 11.7 g/dL — AB (ref 13.0–17.0)
MCH: 28.5 pg (ref 26.0–34.0)
MCHC: 34.3 g/dL (ref 30.0–36.0)
MCV: 83 fL (ref 78.0–100.0)
Platelets: 216 10*3/uL (ref 150–400)
RBC: 4.11 MIL/uL — AB (ref 4.22–5.81)
RDW: 18.6 % — ABNORMAL HIGH (ref 11.5–15.5)
WBC: 6.7 10*3/uL (ref 4.0–10.5)

## 2016-08-31 LAB — HEMOGLOBIN AND HEMATOCRIT, BLOOD
HCT: 32.4 % — ABNORMAL LOW (ref 39.0–52.0)
HEMATOCRIT: 33.3 % — AB (ref 39.0–52.0)
HEMOGLOBIN: 11.1 g/dL — AB (ref 13.0–17.0)
Hemoglobin: 11.5 g/dL — ABNORMAL LOW (ref 13.0–17.0)

## 2016-08-31 LAB — PREPARE RBC (CROSSMATCH)

## 2016-08-31 LAB — MAGNESIUM: Magnesium: 2 mg/dL (ref 1.7–2.4)

## 2016-08-31 MED ORDER — PANTOPRAZOLE SODIUM 40 MG IV SOLR
40.0000 mg | Freq: Two times a day (BID) | INTRAVENOUS | Status: DC
  Administered 2016-08-31 – 2016-09-01 (×4): 40 mg via INTRAVENOUS
  Filled 2016-08-31 (×4): qty 40

## 2016-08-31 MED ORDER — SODIUM CHLORIDE 0.9 % IV SOLN: Freq: Once | INTRAVENOUS | Status: DC

## 2016-08-31 MED ORDER — TECHNETIUM TC 99M-LABELED RED BLOOD CELLS IV KIT
25.6000 | PACK | Freq: Once | INTRAVENOUS | Status: AC | PRN
  Administered 2016-08-31: 25.6 via INTRAVENOUS

## 2016-08-31 MED ORDER — CALCIUM CARBONATE-VITAMIN D 500-200 MG-UNIT PO TABS
1.0000 | ORAL_TABLET | Freq: Every day | ORAL | Status: DC
  Administered 2016-08-31 – 2016-09-03 (×4): 1 via ORAL
  Filled 2016-08-31 (×4): qty 1

## 2016-08-31 MED ORDER — SODIUM CHLORIDE 0.9 % IV BOLUS (SEPSIS): 1000.0000 mL | Freq: Once | INTRAVENOUS | Status: DC

## 2016-08-31 MED ORDER — HYDRALAZINE HCL 20 MG/ML IJ SOLN: 5.0000 mg | INTRAMUSCULAR | Status: DC | PRN

## 2016-08-31 NOTE — ED Notes (Signed)
Attempted report at this time.  Nurse to call back when available. 

## 2016-08-31 NOTE — Progress Notes (Signed)
PROGRESS NOTE    Melvin Hudson  FUX:[id number] DOB: 03/02/1955 DOA: 08/30/2016 PCP: [name], MD   Brief Narrative:  61 y.o. BM PMHx Diverticular bleed in 2016 requiring multiple units transfusion, source never found per patient despite colonoscopy. 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  Symptoms are persistent, frequent, and constant.  No abd pain.  He describes "some" blood per rectum with his stool subjectively (objectively we are seeing ALL blood per rectum with clots, again see photo below).  ED Course: Last bleeding episode at 9pm.  HGB 13.  Guiac positive.  Vitals stable at this time.    Subjective: 7/15  A/O 4, negative N/V, negative SOB, negative CP. Continued BRBPR     Assessment & Plan:   Principal Problem:   BRBPR (bright red blood per rectum) Active Problems:   HTN (hypertension)   OSA (obstructive sleep apnea)   History of GI diverticular bleed  Lower GI bleed -Continued BRBPR  -Protonix IV 40 mg BID -Normal saline 75 ml/hr -NM RBC scan shows active bleeding proximal/mid sigmoid colon see results below. -Patient IR for embolization? -Trend H/H q 6hr -Transfuse for hemoglobin<10 -Spoke at length with Dr. Reesa Chew IR, believes patient bleeding his tamponade and off and CT angiogram would be low yield and would not be able to embolize. Stated if patient begins to bleed again or become hemodynamically unstable to call immediately and he would bring him to IR lab for emergent embolization.  OSA -CPAP per respiratory  Essential HTN -Hold BP medication       DVT prophylaxis: SCD  Code Status: Full Family Communication: None Disposition Plan: IR for embolization?   Consultants:  GI IR    Procedures/Significant Events:  7/15 NM tagged RBC scan:-Active gastrointestinal bleeding in the proximal to mid sigmoid colon. -This  could be due to bleeding associated with a diverticulum, mass or vascular malformation.     VENTILATOR SETTINGS: None   Cultures None  Antimicrobials: None   Devices None   LINES / TUBES:  None    Continuous Infusions: . sodium chloride 75 mL/hr at 08/31/16 0123  . sodium chloride    . sodium chloride       Objective: Vitals:   08/30/16 2345 08/31/16 0017 08/31/16 0249 08/31/16 0525  BP: 120/82  (!) 96/55 137/70  Pulse: 70  68 61  Resp: 20  18 (!) 36  Temp:  98.7 F (37.1 C) (!) 97.4 F (36.3 C) (!) 97.5 F (36.4 C)  TempSrc:  Oral  Oral  SpO2: 96%  95% 95%  Weight:      Height:        Intake/Output Summary (Last 24 hours) at 08/31/16 0820 Last data filed at 08/31/16 0400  Gross per 24 hour  Intake                0 ml  Output              150 ml  Net             -150 ml   Filed Weights   08/30/16 2005  Weight: (!) 324 lb (147 kg)    Examination:  General: A/O 4, No acute respiratory distress Eyes: negative scleral hemorrhage, negative anisocoria, negative icterus ENT: Negative Runny nose, negative gingival bleeding, Neck:  Negative scars, masses, torticollis, lymphadenopathy, JVD Lungs: Clear to auscultation bilaterally without  wheezes or crackles Cardiovascular: Regular rate and rhythm without murmur gallop or rub normal S1 and S2 Abdomen: Morbidly obese, negative abdominal pain, nondistended, positive soft, bowel sounds, no rebound, no ascites, no appreciable mass Extremities: No significant cyanosis, clubbing, or edema bilateral lower extremities Skin: Negative rashes, lesions, ulcers Psychiatric:  Negative depression, negative anxiety, negative fatigue, negative mania  Central nervous system:  Cranial nerves II through XII intact, tongue/uvula midline, all extremities muscle strength 5/5, sensation intact  negative dysarthria, negative expressive aphasia, negative receptive aphasia.  .     Data Reviewed: Care during the described time  interval was provided by me .  I have reviewed this patient's available data, including medical history, events of note, physical examination, and all test results as part of my evaluation. I have personally reviewed and interpreted all radiology studies.  CBC:  Recent Labs Lab 08/30/16 2014 08/31/16 0427  WBC 6.8 6.7  HGB 13.0 11.7*  HCT 37.6* 34.1*  MCV 82.1 83.0  PLT 242 283   Basic Metabolic Panel:  Recent Labs Lab 08/30/16 2014 08/31/16 0427  NA 139 139  K 3.4* 3.2*  CL 102 103  CO2 29 30  GLUCOSE 156* 154*  BUN 5* 6  CREATININE 1.01 0.98  CALCIUM 9.5 8.7*   GFR: Estimated Creatinine Clearance: 118 mL/min (by C-G formula based on SCr of 0.98 mg/dL). Liver Function Tests:  Recent Labs Lab 08/30/16 2014  AST 21  ALT 21  ALKPHOS 145*  BILITOT 0.9  PROT 7.1  ALBUMIN 4.3   No results for input(s): LIPASE, AMYLASE in the last 168 hours. No results for input(s): AMMONIA in the last 168 hours. Coagulation Profile:  Recent Labs Lab 08/30/16 2249  INR 1.12   Cardiac Enzymes: No results for input(s): CKTOTAL, CKMB, CKMBINDEX, TROPONINI in the last 168 hours. BNP (last 3 results) No results for input(s): PROBNP in the last 8760 hours. HbA1C: No results for input(s): HGBA1C in the last 72 hours. CBG: No results for input(s): GLUCAP in the last 168 hours. Lipid Profile: No results for input(s): CHOL, HDL, LDLCALC, TRIG, CHOLHDL, LDLDIRECT in the last 72 hours. Thyroid Function Tests: No results for input(s): TSH, T4TOTAL, FREET4, T3FREE, THYROIDAB in the last 72 hours. Anemia Panel: No results for input(s): VITAMINB12, FOLATE, FERRITIN, TIBC, IRON, RETICCTPCT in the last 72 hours. Urine analysis: No results found for: COLORURINE, APPEARANCEUR, LABSPEC, PHURINE, GLUCOSEU, HGBUR, BILIRUBINUR, KETONESUR, PROTEINUR, UROBILINOGEN, NITRITE, LEUKOCYTESUR Sepsis Labs: @LABRCNTIP (procalcitonin:4,lacticidven:4)  )No results found for this or any previous visit  (from the past 240 hour(s)).       Radiology Studies: No results found.      Scheduled Meds: . allopurinol  300 mg Oral Daily  . calcium-vitamin D  1 tablet Oral Q breakfast  . pantoprazole  40 mg Oral Daily   Continuous Infusions: . sodium chloride 75 mL/hr at 08/31/16 0123  . sodium chloride    . sodium chloride       LOS: 1 day    Time spent: 40 minutes    Samhita Kretsch, Geraldo Docker, MD [facility] Pager [phone]   If 7PM-7AM, please contact night-coverage [email or web address] Password [email or web address] 08/31/2016, 8:20 AM

## 2016-08-31 NOTE — Progress Notes (Addendum)
Informed that patient had another bloody BM (2 since getting to floor now according to RN).  Will call radiology and see about getting that stat NM tagged RBC scan.  Will hold all BP meds at this point and just use PRN hydralazine if needed.   P63, BP: 96/55  CBC now.  Calling radiology.  Doing 1L bolus NS now.  Spoke with radiology, they are calling NM tech in to attempt to get scan stat.  Also having them prepare 2 units PRBC for transfusion if needed (suspect that they will be) based on CBC results.

## 2016-09-01 DIAGNOSIS — E662 Morbid (severe) obesity with alveolar hypoventilation: Secondary | ICD-10-CM

## 2016-09-01 LAB — HEMOGLOBIN AND HEMATOCRIT, BLOOD
HCT: 28.7 % — ABNORMAL LOW (ref 39.0–52.0)
HCT: 30.9 % — ABNORMAL LOW (ref 39.0–52.0)
HEMATOCRIT: 28.5 % — AB (ref 39.0–52.0)
HEMOGLOBIN: 9.7 g/dL — AB (ref 13.0–17.0)
Hemoglobin: 9.6 g/dL — ABNORMAL LOW (ref 13.0–17.0)
Hemoglobin: 10.5 g/dL — ABNORMAL LOW (ref 13.0–17.0)

## 2016-09-01 LAB — HIV ANTIBODY (ROUTINE TESTING W REFLEX): HIV Screen 4th Generation wRfx: NONREACTIVE

## 2016-09-01 LAB — PREPARE RBC (CROSSMATCH)

## 2016-09-01 LAB — MRSA PCR SCREENING: MRSA by PCR: NEGATIVE

## 2016-09-01 MED ORDER — SODIUM CHLORIDE 0.9 % IV SOLN: Freq: Once | INTRAVENOUS | Status: DC

## 2016-09-01 NOTE — Consult Note (Addendum)
Referring Provider: Dr. Sherral Hammers Primary Care Physician:  [name], MD Primary Gastroenterologist:  Dr. Michail Sermon  Reason for Consultation:  GI bleed  HPI: Melvin Hudson is a 61 y.o. male with a history of diverticular bleeding (2016) was admitted for the acute onset of multiple bright red blood per rectum episodes without melena, nausea, or vomiting. Had mild RUQ pain as well. Hgb 13 on admit on 08/30/16 and is 9.7 today. RBC bleeding scan early yesterday morning was positive for active bleeding in the proximal to mid-sigmoid colon. Angiogram was not attempted. Last colonoscopy in March 2016 showed left-sided diverticulosis and internal hemorrhoids. He reports that he last had bleeding yesterday around 5 pm and it was small in amount of bleeding. A smear of red blood reported in chart yesterday morning. He feels fine now and is hungry.  Past Medical History:  Diagnosis Date  . Angioedema 09/17/11   tongue and lips  . Arthritis    "both of my legs and feet" (01/05/2014)  . Hx of gout   . Hypertension   . Obesity   . OSA on CPAP   . S/P radiation therapy 07/26/2013-09/15/2013   Right tonsil/bilateral neck/ 7000 cGy  . 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 COLONOSCOPY Left 04/11/2014   Procedure: COLONOSCOPY;  Surgeon: Arta Silence, MD;  Location: [facility] ENDOSCOPY;  Service: Endoscopy;  Laterality: Left;  . COLONOSCOPY N/A 04/20/2014   Procedure: COLONOSCOPY;  Surgeon: Lear Ng, MD;  Location: [facility] ENDOSCOPY;  Service: Endoscopy;  Laterality: N/A;  . COLONOSCOPY W/ BIOPSIES AND POLYPECTOMY     benign  . DIRECT LARYNGOSCOPY  01/05/2014  . DIRECT LARYNGOSCOPY N/A 01/05/2014   Procedure: DIRECT LARYNGOSCOPY AND BIOPSY;  Surgeon: [name], MD;  Location: [place];  Service: ENT;  Laterality: N/A;  . KNEE ARTHROSCOPY Right ~ 1994  . LEFT AND RIGHT HEART CATHETERIZATION WITH CORONARY/GRAFT ANGIOGRAM N/A 12/31/2010    Procedure: LEFT AND RIGHT HEART CATHETERIZATION WITH Beatrix Fetters;  Surgeon: Laverda Page, MD;  Location: [facility] CATH LAB;  Service: Cardiovascular;  Laterality: N/A;  . MULTIPLE EXTRACTIONS WITH ALVEOLOPLASTY N/A 07/08/2013   Procedure: Extraction of tooth #'s 22, 27 with alveoloplasty and bilateal mandibular facial exostoses reductions;  Surgeon: Lenn Cal, DDS;  Location: [facility] ORS;  Service: Oral Surgery;  Laterality: N/A;  . MULTIPLE TOOTH EXTRACTIONS  ~ 2008    Prior to Admission medications   Medication Sig Start Date End Date Taking? Authorizing Provider  allopurinol (ZYLOPRIM) 300 MG tablet Take 300 mg by mouth daily as needed.    Yes [provider]  aspirin 81 MG tablet Take 81 mg by mouth every other day.   Yes [provider]  calcium citrate-vitamin D 500-500 MG-UNIT chewable tablet Chew 1 tablet by mouth 2 (two) times daily.   Yes [provider]  carvedilol (COREG) 25 MG tablet Take 25 mg by mouth 2 (two) times daily with a meal.   Yes [provider]  cetirizine (ZYRTEC) 10 MG tablet Take 10 mg by mouth daily as needed. 06/06/16  Yes [provider]  eplerenone (INSPRA) 50 MG tablet Take 50 mg by mouth daily. 08/29/16  Yes [provider]  furosemide (LASIX) 40 MG tablet Take 40 mg by mouth 2 (two) times daily. 08/25/16  Yes [provider]  NIFEdipine (ADALAT CC) 90 MG 24 hr tablet Take 90 mg by mouth daily.   Yes [provider]  omeprazole (PRILOSEC) 20 MG capsule Take 20 mg by mouth as needed. 06/04/16  Yes [provider]  oxyCODONE-acetaminophen (PERCOCET) 10-325 MG per tablet Take 1 tablet by mouth every 6 (six) hours as needed for pain. Patient taking differently: Take 1 tablet by mouth 4 (four) times daily.  11/11/13  Yes Eppie Gibson, MD  polyethylene glycol [facility] / Floria Raveling) packet Take 17 g by mouth daily. 04/20/14  Yes McKeag, Marylynn Pearson, MD  colchicine 0.6 MG tablet Take 0.6 mg  by mouth daily as needed.    [provider]  diphenhydrAMINE (BENADRYL) 25 MG tablet Take 1 tablet (25 mg total) by mouth every 6 (six) hours. Patient taking differently: Take 25 mg by mouth every 6 (six) hours as needed.  10/29/13   Dorie Rank, MD  sildenafil (REVATIO) 20 MG tablet Take 20 mg by mouth as needed.    [provider]    Scheduled Meds: . allopurinol  300 mg Oral Daily  . calcium-vitamin D  1 tablet Oral Q breakfast  . pantoprazole (PROTONIX) IV  40 mg Intravenous Q12H   Continuous Infusions: . sodium chloride 75 mL/hr at 09/01/16 0808  . sodium chloride    . sodium chloride    . sodium chloride     PRN Meds:.acetaminophen **OR** acetaminophen, hydrALAZINE, ondansetron **OR** ondansetron (ZOFRAN) IV, oxyCODONE-acetaminophen **AND** oxyCODONE  Allergies as of 08/30/2016 - Review Complete 08/30/2016  Allergen Reaction Noted  . Advil [ibuprofen]  09/17/2011  . Azilsartan Swelling 09/18/2011    Family History  Problem Relation Age of Onset  . Cancer Sister        breast ca    Social History   Social History  . Marital status: Legally Separated    Spouse name: N/A  . Number of children: N/A  . Years of education: N/A   Occupational History  . Not on file.   Social History Main Topics  . Smoking status: Former Smoker    Years: 42.00    Types: Cigarettes, Cigars    Quit date: 06/17/2013  . Smokeless tobacco: Never Used     Comment: "Quit smoking cigarettes in ~  2007"  . 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 Drug use: No  . Sexual activity: Not Currently   Other Topics Concern  . Not on file   Social History Narrative  . No narrative on file    Review of Systems: All negative except as stated above in HPI.  Physical Exam: Vital signs: Vitals:   09/01/16 0316 09/01/16 0723  BP: 122/71 119/69  Pulse: 68 72  Resp: 14 18  Temp: 97.6 F (36.4 C) 98.2 F (36.8 C)   Last BM Date: 08/31/16 General:    Lethargic, obese, pleasant and cooperative in NAD Head: atraumatic, normocephalic Eyes: anicteric sclera ENT: oropharynx clear Neck: supple, nontender Lungs:  Clear throughout to auscultation.   No wheezes, crackles, or rhonchi. No acute distress. Heart:  Regular rate and rhythm; no murmurs, clicks, rubs,  or gallops. Abdomen: soft, nontender, nondistended, +BS, obese  Rectal:  Deferred Ext: no edema  GI:  Lab Results:  Recent Labs  08/30/16 2014 08/31/16 0427  08/31/16 1743 09/01/16 0122 09/01/16 0552  WBC 6.8 6.7  --   --   --   --   HGB 13.0 11.7*  < > 11.5* 9.6* 9.7*  HCT 37.6* 34.1*  < > 33.3* 28.7* 28.5*  PLT 242 216  --   --   --   --   < > =  values in this interval not displayed. BMET  Recent Labs  08/30/16 2014 08/31/16 0427 08/31/16 1320  NA 139 139 139  K 3.4* 3.2* 3.4*  CL 102 103 104  CO2 29 30 28   GLUCOSE 156* 154* 129*  BUN 5* 6 7  CREATININE 1.01 0.98 0.87  CALCIUM 9.5 8.7* 8.7*   LFT  Recent Labs  08/30/16 2014  PROT 7.1  ALBUMIN 4.3  AST 21  ALT 21  ALKPHOS 145*  BILITOT 0.9   PT/INR  Recent Labs  08/30/16 2249  LABPROT 14.4  INR 1.12     Studies/Results: Nm Gi Blood Loss  Result Date: 08/31/2016 CLINICAL DATA:  Bright red rectal bleeding. History of a gastrointestinal bleed in 2016 with no source found. EXAM: NUCLEAR MEDICINE GASTROINTESTINAL BLEEDING SCAN TECHNIQUE: Sequential abdominal images were obtained following intravenous administration of Tc-15m labeled red blood cells. RADIOPHARMACEUTICALS:  25.6 mCi Tc-6m in-vitro labeled red cells. COMPARISON:  04/12/2014 and visceral arteriogram dated 04/11/2014. FINDINGS: Active tracer extravasation in the sigmoid colon extending retrograde into the descending colon, splenic flexure and transverse colon and extending antegrade into the distal sigmoid colon and proximal rectum. IMPRESSION: Active gastrointestinal bleeding in the proximal to mid sigmoid colon. This could be due to  bleeding associated with a diverticulum, mass or vascular malformation. These results will be called to the ordering clinician or representative by the Radiologist Assistant, and communication documented in the PACS or zVision Dashboard. Electronically Signed   By: Claudie Revering M.D.   On: 08/31/2016 08:40    Impression/Plan: Lower GI bleed likely diverticular with a positive RBC scan early yesterday with bleeding in the sigmoid colon. No role for an angiogram at this time since the bleeding seems to have stopped. Clear liquid diet and advance diet. If Hgb stable and no further bleeding, then ok to discharge home in the next 2-3 days from a GI standpoint. Will follow.    LOS: 2 days   Arabi C.  09/01/2016, 11:36 AM  Pager [phone]  AFTER 5 pm or on weekends please call [phone]

## 2016-09-01 NOTE — Progress Notes (Signed)
Pt up to bathroom for BM. Pt called RN for BRB in stool. RN noted BRB minimal mixed with stool. Pt remains asymptomatic. MD notified. Will continue to monitor.

## 2016-09-01 NOTE — Progress Notes (Signed)
PROGRESS NOTE    Melvin Hudson  LHT:[id number] DOB: 07-27-1955 DOA: 08/30/2016 PCP: [name], MD   Brief Narrative:  61 y.o. BM PMHx Diverticular bleed in 2016 requiring multiple units transfusion, source never found per patient despite colonoscopy. 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  Symptoms are persistent, frequent, and constant.  No abd pain.  He describes "some" blood per rectum with his stool subjectively (objectively we are seeing ALL blood per rectum with clots, again see photo below).  ED Course: Last bleeding episode at 9pm.  HGB 13.  Guiac positive.  Vitals stable at this time.    Subjective: 7/16  A/O 4, negative N/V, negative SOB, negative CP. States last episode of BRBPR was the one that he showed me yesterday at ~1100. States recalls having colonoscopy 2016. Review of EMR shows last colonoscopy 04/20/2014 by Dr. Wilford Corner. Study showed Diffuse left-sided diverticulosis Medium-sized internal hemorrhoids    Assessment & Plan:   Principal Problem:   BRBPR (bright red blood per rectum) Active Problems:   HTN (hypertension)   OSA (obstructive sleep apnea)   History of GI diverticular bleed  Lower GI bleed -Last BRBPR yesterday at~1100 -Protonix IV 40 mg BID -Normal saline 75 ml/hr until patient able to take PO -NM RBC scan shows active bleeding proximal/mid sigmoid colon see results below. -Trend H/H q 6hr Recent Labs Lab 08/30/16 2014 08/31/16 0427 08/31/16 1320 08/31/16 1743 09/01/16 0122 09/01/16 0552  HGB 13.0 11.7* 11.1* 11.5* 9.6* 9.7*  -While some of this is most likely dilutional, cannot be certain patient still is not having some residual GI bleed.- -Transfuse for hemoglobin<10 -Spoke at length with Dr. Reesa Chew IR, believes patient bleeding his tamponade and off and CT angiogram would be low yield and would not be able  to embolize. Stated if patient begins to bleed again or become hemodynamically unstable to call immediately and he would bring him to IR lab for emergent embolization. -7/16 transfuse 1 unit PRBC  OSA/OHS -CPAP per respiratory  Essential HTN -Hold BP medication. BPs remains soft.       DVT prophylaxis: SCD  Code Status: Full Family Communication: None Disposition Plan: IR for embolization?   Consultants:  [facility] consult pending IR phone consult Dr. Reesa Chew    Procedures/Significant Events:  7/15 NM tagged RBC scan:-Active gastrointestinal bleeding in the proximal to mid sigmoid colon. -This could be due to bleeding associated with a diverticulum, mass or vascular malformation. 7/16 transfuse 1 unit PRBC    VENTILATOR SETTINGS: None   Cultures None  Antimicrobials: None   Devices None   LINES / TUBES:  None    Continuous Infusions: . sodium chloride 75 mL/hr at 08/31/16 1900  . sodium chloride    . sodium chloride       Objective: Vitals:   09/01/16 0000 09/01/16 0100 09/01/16 0316 09/01/16 0723  BP:   122/71 119/69  Pulse: 64 64 68 72  Resp: 17 18 14 18   Temp:   97.6 F (36.4 C) 98.2 F (36.8 C)  TempSrc:   Oral Oral  SpO2: 95% 91% 96% 100%  Weight:      Height:        Intake/Output Summary (Last 24 hours) at 09/01/16 0757 Last data filed at 09/01/16 0646  Gross per 24 hour  Intake          2053.75 ml  Output              775 ml  Net          1278.75 ml   Filed Weights   08/30/16 2005  Weight: (!) 324 lb (147 kg)    Examination:  General: A/O 4, No acute respiratory distress Eyes: negative scleral hemorrhage, negative anisocoria, negative icterus ENT: Negative Runny nose, negative gingival bleeding, Neck:  Negative scars, masses, torticollis, lymphadenopathy, JVD Lungs: Clear to auscultation bilaterally without wheezes or crackles Cardiovascular: Regular rate and rhythm without murmur gallop or rub normal S1 and  S2 Abdomen: Morbidly obese, negative abdominal pain, nondistended, positive soft, bowel sounds, no rebound, no ascites, no appreciable mass Extremities: No significant cyanosis, clubbing, or edema bilateral lower extremities Skin: Negative rashes, lesions, ulcers Psychiatric:  Negative depression, negative anxiety, negative fatigue, negative mania  Central nervous system:  Cranial nerves II through XII intact, tongue/uvula midline, all extremities muscle strength 5/5, sensation intact  negative dysarthria, negative expressive aphasia, negative receptive aphasia.  .     Data Reviewed: Care during the described time interval was provided by me .  I have reviewed this patient's available data, including medical history, events of note, physical examination, and all test results as part of my evaluation. I have personally reviewed and interpreted all radiology studies.  CBC:  Recent Labs Lab 08/30/16 2014 08/31/16 0427 08/31/16 1320 08/31/16 1743 09/01/16 0122 09/01/16 0552  WBC 6.8 6.7  --   --   --   --   HGB 13.0 11.7* 11.1* 11.5* 9.6* 9.7*  HCT 37.6* 34.1* 32.4* 33.3* 28.7* 28.5*  MCV 82.1 83.0  --   --   --   --   PLT 242 216  --   --   --   --    Basic Metabolic Panel:  Recent Labs Lab 08/30/16 2014 08/31/16 0427 08/31/16 1320  NA 139 139 139  K 3.4* 3.2* 3.4*  CL 102 103 104  CO2 29 30 28   GLUCOSE 156* 154* 129*  BUN 5* 6 7  CREATININE 1.01 0.98 0.87  CALCIUM 9.5 8.7* 8.7*  MG  --   --  2.0   GFR: Estimated Creatinine Clearance: 132.9 mL/min (by C-G formula based on SCr of 0.87 mg/dL). Liver Function Tests:  Recent Labs Lab 08/30/16 2014  AST 21  ALT 21  ALKPHOS 145*  BILITOT 0.9  PROT 7.1  ALBUMIN 4.3   No results for input(s): LIPASE, AMYLASE in the last 168 hours. No results for input(s): AMMONIA in the last 168 hours. Coagulation Profile:  Recent Labs Lab 08/30/16 2249  INR 1.12   Cardiac Enzymes: No results for input(s): CKTOTAL, CKMB,  CKMBINDEX, TROPONINI in the last 168 hours. BNP (last 3 results) No results for input(s): PROBNP in the last 8760 hours. HbA1C: No results for input(s): HGBA1C in the last 72 hours. CBG: No results for input(s): GLUCAP in the last 168 hours. Lipid Profile: No results for input(s): CHOL, HDL, LDLCALC, TRIG, CHOLHDL, LDLDIRECT in the last 72 hours. Thyroid Function Tests: No results for input(s): TSH, T4TOTAL, FREET4, T3FREE, THYROIDAB in the last 72 hours. Anemia Panel: No results for input(s): VITAMINB12, FOLATE, FERRITIN, TIBC, IRON, RETICCTPCT in the last 72 hours. Urine analysis: No results found for: COLORURINE, APPEARANCEUR, LABSPEC, PHURINE, GLUCOSEU, HGBUR, BILIRUBINUR, KETONESUR, PROTEINUR, UROBILINOGEN, NITRITE, LEUKOCYTESUR Sepsis Labs: @LABRCNTIP (procalcitonin:4,lacticidven:4)  )No results found for this or any previous visit (from the past 240 hour(s)).       Radiology  Studies: Nm Gi Blood Loss  Result Date: 08/31/2016 CLINICAL DATA:  Bright red rectal bleeding. History of a gastrointestinal bleed in 2016 with no source found. EXAM: NUCLEAR MEDICINE GASTROINTESTINAL BLEEDING SCAN TECHNIQUE: Sequential abdominal images were obtained following intravenous administration of Tc-2m labeled red blood cells. RADIOPHARMACEUTICALS:  25.6 mCi Tc-42m in-vitro labeled red cells. COMPARISON:  04/12/2014 and visceral arteriogram dated 04/11/2014. FINDINGS: Active tracer extravasation in the sigmoid colon extending retrograde into the descending colon, splenic flexure and transverse colon and extending antegrade into the distal sigmoid colon and proximal rectum. IMPRESSION: Active gastrointestinal bleeding in the proximal to mid sigmoid colon. This could be due to bleeding associated with a diverticulum, mass or vascular malformation. These results will be called to the ordering clinician or representative by the Radiologist Assistant, and communication documented in the PACS or zVision  Dashboard. Electronically Signed   By: Claudie Revering M.D.   On: 08/31/2016 08:40        Scheduled Meds: . allopurinol  300 mg Oral Daily  . calcium-vitamin D  1 tablet Oral Q breakfast  . pantoprazole (PROTONIX) IV  40 mg Intravenous Q12H   Continuous Infusions: . sodium chloride 75 mL/hr at 08/31/16 1900  . sodium chloride    . sodium chloride       LOS: 2 days    Time spent: 40 minutes    Jazion Atteberry, Geraldo Docker, MD [facility] Pager [phone]   If 7PM-7AM, please contact night-coverage [email or web address] Password [facility] 09/01/2016, 7:57 AM

## 2016-09-02 LAB — HEMOGLOBIN AND HEMATOCRIT, BLOOD
HCT: 28.2 % — ABNORMAL LOW (ref 39.0–52.0)
HCT: 30.4 % — ABNORMAL LOW (ref 39.0–52.0)
HCT: 30.7 % — ABNORMAL LOW (ref 39.0–52.0)
HEMATOCRIT: 30.4 % — AB (ref 39.0–52.0)
HEMOGLOBIN: 10.6 g/dL — AB (ref 13.0–17.0)
Hemoglobin: 10.4 g/dL — ABNORMAL LOW (ref 13.0–17.0)
Hemoglobin: 10.6 g/dL — ABNORMAL LOW (ref 13.0–17.0)
Hemoglobin: 9.7 g/dL — ABNORMAL LOW (ref 13.0–17.0)

## 2016-09-02 MED ORDER — PANTOPRAZOLE SODIUM 40 MG PO TBEC
40.0000 mg | DELAYED_RELEASE_TABLET | Freq: Two times a day (BID) | ORAL | Status: DC
  Administered 2016-09-02 – 2016-09-03 (×3): 40 mg via ORAL
  Filled 2016-09-02 (×3): qty 1

## 2016-09-02 MED ORDER — HYDROCORTISONE ACE-PRAMOXINE 1-1 % RE FOAM
1.0000 | Freq: Two times a day (BID) | RECTAL | Status: DC
  Administered 2016-09-02 – 2016-09-03 (×2): 1 via RECTAL
  Filled 2016-09-02: qty 10

## 2016-09-02 NOTE — Progress Notes (Signed)
PROGRESS NOTE    Melvin Hudson  NWG:[id number] DOB: 08/13/1955 DOA: 08/30/2016 PCP: [name], MD   Brief Narrative:  61 y.o. BM PMHx Diverticular bleed in 2016 requiring multiple units transfusion, source never found per patient despite colonoscopy. 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  Symptoms are persistent, frequent, and constant.  No abd pain.  He describes "some" blood per rectum with his stool subjectively (objectively we are seeing ALL blood per rectum with clots, again see photo below).  ED Course: Last bleeding episode at 9pm.  HGB 13.  Guiac positive.  Vitals stable at this time.    Subjective: 7/17  A/O 4, negative N/V, negative SOB, negative CP. States small streaks of blood in his stool but nothing significant.     Assessment & Plan:   Principal Problem:   BRBPR (bright red blood per rectum) Active Problems:   HTN (hypertension)   OSA (obstructive sleep apnea)   History of GI diverticular bleed  Lower GI bleed -Last BRBPR yesterday at~1100 -Protonix 40 mg BID -NM RBC scan shows active bleeding proximal/mid sigmoid colon see results below. -Trend H/H q 6hr Recent Labs Lab 08/31/16 1320 08/31/16 1743 09/01/16 0122 09/01/16 0552 09/01/16 1758 09/02/16 0012  HGB 11.1* 11.5* 9.6* 9.7* 10.5* 9.7*  -While some of this is most likely dilutional, cannot be certain patient still is not having some residual GI bleed. -Spoke at length with Dr. Reesa Chew IR, believes patient bleeding his tamponade and off and CT angiogram would be low yield and would not be able to embolize. Stated if patient begins to bleed again or become hemodynamically unstable to call immediately and he would bring him to IR lab for emergent embolization. -Evaluated by [facility] Dr. Wilford Corner on 7/16. Recommends no further invasive procedure discharge in the A.m. if  hemoglobin remains stable.  -7/16 transfuse 1 unit PRBC -Transfuse for hemoglobin<7 -Schedule follow-up with Dr. Wilford Corner in 8 weeks lower GI bleed.  OSA/OHS -CPAP per respiratory  Essential HTN -Hold BP medication. BPs remains soft. -Schedule follow-up 1 to 2 weeks Dr [name] essential hypertension, OSA, lower GI bleed       DVT prophylaxis: SCD  Code Status: Full Family Communication: None Disposition Plan: IR for embolization?   Consultants:  [facility] Dr. Wilford Corner IR phone consult Dr. Reesa Chew    Procedures/Significant Events:  7/15 NM tagged RBC scan:-Active gastrointestinal bleeding in the proximal to mid sigmoid colon. -This could be due to bleeding associated with a diverticulum, mass or vascular malformation. 7/16 transfuse 1 unit PRBC    VENTILATOR SETTINGS: None   Cultures None  Antimicrobials: None   Devices None   LINES / TUBES:  None    Continuous Infusions: . sodium chloride 75 mL/hr at 09/01/16 0808  . sodium chloride    . sodium chloride    . sodium chloride       Objective: Vitals:   09/01/16 2321 09/02/16 0004 09/02/16 0415 09/02/16 0719  BP: (!) 150/99  128/76 (!) 153/100  Pulse: 69   67  Resp: 17 18 17  (!) 23  Temp: 97.8 F (36.6 C)  97.7 F (36.5 C) 98.7 F (37.1 C)  TempSrc: Axillary  Oral Oral  SpO2: 93%  96% 95%  Weight:      Height:        Intake/Output Summary (Last 24 hours) at 09/02/16  [place] filed at 09/02/16 0422  Gross per 24 hour  Intake           2327.5 ml  Output             1125 ml  Net           1202.5 ml   Filed Weights   08/30/16 2005  Weight: (!) 324 lb (147 kg)    Examination:  General: A/O 4, No acute respiratory distress Eyes: negative scleral hemorrhage, negative anisocoria, negative icterus ENT: Negative Runny nose, negative gingival bleeding, Neck:  Negative scars, masses, torticollis, lymphadenopathy, JVD Lungs: Clear to auscultation bilaterally  without wheezes or crackles Cardiovascular: Regular rate and rhythm without murmur gallop or rub normal S1 and S2 Abdomen: Morbidly obese, negative abdominal pain, nondistended, positive soft, bowel sounds, no rebound, no ascites, no appreciable mass Extremities: No significant cyanosis, clubbing, or edema bilateral lower extremities Skin: Negative rashes, lesions, ulcers Psychiatric:  Negative depression, negative anxiety, negative fatigue, negative mania  Central nervous system:  Cranial nerves II through XII intact, tongue/uvula midline, all extremities muscle strength 5/5, sensation intact  negative dysarthria, negative expressive aphasia, negative receptive aphasia.  .     Data Reviewed: Care during the described time interval was provided by me .  I have reviewed this patient's available data, including medical history, events of note, physical examination, and all test results as part of my evaluation. I have personally reviewed and interpreted all radiology studies.  CBC:  Recent Labs Lab 08/30/16 2014 08/31/16 0427  08/31/16 1743 09/01/16 0122 09/01/16 0552 09/01/16 1758 09/02/16 0012  WBC 6.8 6.7  --   --   --   --   --   --   HGB 13.0 11.7*  < > 11.5* 9.6* 9.7* 10.5* 9.7*  HCT 37.6* 34.1*  < > 33.3* 28.7* 28.5* 30.9* 28.2*  MCV 82.1 83.0  --   --   --   --   --   --   PLT 242 216  --   --   --   --   --   --   < > = values in this interval not displayed. Basic Metabolic Panel:  Recent Labs Lab 08/30/16 2014 08/31/16 0427 08/31/16 1320  NA 139 139 139  K 3.4* 3.2* 3.4*  CL 102 103 104  CO2 29 30 28   GLUCOSE 156* 154* 129*  BUN 5* 6 7  CREATININE 1.01 0.98 0.87  CALCIUM 9.5 8.7* 8.7*  MG  --   --  2.0   GFR: Estimated Creatinine Clearance: 132.9 mL/min (by C-G formula based on SCr of 0.87 mg/dL). Liver Function Tests:  Recent Labs Lab 08/30/16 2014  AST 21  ALT 21  ALKPHOS 145*  BILITOT 0.9  PROT 7.1  ALBUMIN 4.3   No results for input(s):  LIPASE, AMYLASE in the last 168 hours. No results for input(s): AMMONIA in the last 168 hours. Coagulation Profile:  Recent Labs Lab 08/30/16 2249  INR 1.12   Cardiac Enzymes: No results for input(s): CKTOTAL, CKMB, CKMBINDEX, TROPONINI in the last 168 hours. BNP (last 3 results) No results for input(s): PROBNP in the last 8760 hours. HbA1C: No results for input(s): HGBA1C in the last 72 hours. CBG: No results for input(s): GLUCAP in the last 168 hours. Lipid Profile: No results for input(s): CHOL, HDL, LDLCALC, TRIG, CHOLHDL, LDLDIRECT in the last 72 hours. Thyroid Function Tests: No results for input(s): TSH, T4TOTAL, FREET4, T3FREE, THYROIDAB in  the last 72 hours. Anemia Panel: No results for input(s): VITAMINB12, FOLATE, FERRITIN, TIBC, IRON, RETICCTPCT in the last 72 hours. Urine analysis: No results found for: COLORURINE, APPEARANCEUR, LABSPEC, PHURINE, GLUCOSEU, HGBUR, BILIRUBINUR, KETONESUR, PROTEINUR, UROBILINOGEN, NITRITE, LEUKOCYTESUR Sepsis Labs: @LABRCNTIP (procalcitonin:4,lacticidven:4)  ) Recent Results (from the past 240 hour(s))  MRSA PCR Screening     Status: None   Collection Time: 09/01/16  3:27 PM  Result Value Ref Range Status   MRSA by PCR NEGATIVE NEGATIVE Final    Comment:        The GeneXpert MRSA Assay (FDA approved for NASAL specimens only), is one component of a comprehensive MRSA colonization surveillance program. It is not intended to diagnose MRSA infection nor to guide or monitor treatment for MRSA infections.          Radiology Studies: Nm Gi Blood Loss  Result Date: 08/31/2016 CLINICAL DATA:  Bright red rectal bleeding. History of a gastrointestinal bleed in 2016 with no source found. EXAM: NUCLEAR MEDICINE GASTROINTESTINAL BLEEDING SCAN TECHNIQUE: Sequential abdominal images were obtained following intravenous administration of Tc-47m labeled red blood cells. RADIOPHARMACEUTICALS:  25.6 mCi Tc-29m in-vitro labeled red cells.  COMPARISON:  04/12/2014 and visceral arteriogram dated 04/11/2014. FINDINGS: Active tracer extravasation in the sigmoid colon extending retrograde into the descending colon, splenic flexure and transverse colon and extending antegrade into the distal sigmoid colon and proximal rectum. IMPRESSION: Active gastrointestinal bleeding in the proximal to mid sigmoid colon. This could be due to bleeding associated with a diverticulum, mass or vascular malformation. These results will be called to the ordering clinician or representative by the Radiologist Assistant, and communication documented in the PACS or zVision Dashboard. Electronically Signed   By: Claudie Revering M.D.   On: 08/31/2016 08:40        Scheduled Meds: . allopurinol  300 mg Oral Daily  . calcium-vitamin D  1 tablet Oral Q breakfast  . pantoprazole (PROTONIX) IV  40 mg Intravenous Q12H   Continuous Infusions: . sodium chloride 75 mL/hr at 09/01/16 0808  . sodium chloride    . sodium chloride    . sodium chloride       LOS: 3 days    Time spent: 40 minutes    Vartan Kerins, Geraldo Docker, MD [facility] Pager [phone]   If 7PM-7AM, please contact night-coverage [email or web address] Password [facility] 09/02/2016, 7:51 AM

## 2016-09-02 NOTE — Progress Notes (Signed)
RT came to place patient on CPAP. Patient is already on CPAP. Patient in no distress at this time.

## 2016-09-02 NOTE — Progress Notes (Signed)
Pt had a BM this morning that this RN did not see. Patient stated that the stool was not bloody but when he went to wipe, he saw a little bit of blood. Will continue to monitor and pass along to on-coming RN.

## 2016-09-02 NOTE — Progress Notes (Signed)
[facility] Progress Note  Melvin Hudson 61 y.o. [birth year]   Subjective: Bright red blood with wiping with solid stool this afternoon (blood in toilet noted by me); patient any blood seen in the stool or water prior to wiping. Denies abdominal pain, N/V. Denies rectal pain or burning. Denies straining to move his bowels today. Ambulating without difficulty.  Objective: Vital signs in last 24 hours: Vitals:   09/02/16 1156 09/02/16 1414  BP: 127/72 (!) 170/95  Pulse: 68 69  Resp: 18 17  Temp: 97.9 F (36.6 C)     Physical Exam: Gen: alert, no acute distress, obese CV: RRR Chest: CTA B Abd: soft, nontender, nondistended, +BS  Lab Results:  Recent Labs  08/31/16 0427 08/31/16 1320  NA 139 139  K 3.2* 3.4*  CL 103 104  CO2 30 28  GLUCOSE 154* 129*  BUN 6 7  CREATININE 0.98 0.87  CALCIUM 8.7* 8.7*  MG  --  2.0    Recent Labs  08/30/16 2014  AST 21  ALT 21  ALKPHOS 145*  BILITOT 0.9  PROT 7.1  ALBUMIN 4.3    Recent Labs  08/30/16 2014 08/31/16 0427  09/02/16 0818 09/02/16 1141  WBC 6.8 6.7  --   --   --   HGB 13.0 11.7*  < > 10.4* 10.6*  HCT 37.6* 34.1*  < > 30.4* 30.7*  MCV 82.1 83.0  --   --   --   PLT 242 216  --   --   --   < > = values in this interval not displayed.  Recent Labs  08/30/16 2249  LABPROT 14.4  INR 1.12      Assessment/Plan: Lower GI bleed likely diverticular that has resolved. Current bleeding likely hemorrhoidal and I think is unrelated to bleeding he had at admission. Hgb stable at 10.6. Will give Proctofoam cream. If bleeding resolves and Hgb stable, then ok for discharge tomorrow from GI standpoint. F/U with me in office in 3-4 weeks. Will sign off. Call if questions.   Mellette C. 09/02/2016, 3:44 PM   Pager [phone]  AFTER 5 pm or on weekends call [phone] ID: Melvin Hudson, male   DOB: 1955/07/15, 61 y.o.   MRN: [id number]

## 2016-09-03 DIAGNOSIS — I10 Essential (primary) hypertension: Secondary | ICD-10-CM

## 2016-09-03 DIAGNOSIS — K625 Hemorrhage of anus and rectum: Secondary | ICD-10-CM

## 2016-09-03 LAB — HEMOGLOBIN AND HEMATOCRIT, BLOOD
HCT: 28.4 % — ABNORMAL LOW (ref 39.0–52.0)
HCT: 30.3 % — ABNORMAL LOW (ref 39.0–52.0)
HEMATOCRIT: 28.1 % — AB (ref 39.0–52.0)
HEMOGLOBIN: 10.4 g/dL — AB (ref 13.0–17.0)
Hemoglobin: 9.7 g/dL — ABNORMAL LOW (ref 13.0–17.0)
Hemoglobin: 9.9 g/dL — ABNORMAL LOW (ref 13.0–17.0)

## 2016-09-03 LAB — TYPE AND SCREEN
ABO/RH(D): O POS
Antibody Screen: NEGATIVE
Unit division: 0
Unit division: 0

## 2016-09-03 LAB — BPAM RBC
Blood Product Expiration Date: 201808052359
Blood Product Expiration Date: 201808052359
ISSUE DATE / TIME: 201807161424
UNIT TYPE AND RH: 5100
Unit Type and Rh: 5100

## 2016-09-03 MED ORDER — HYDROCORTISONE ACE-PRAMOXINE 1-1 % RE FOAM: 1.0000 | Freq: Two times a day (BID) | RECTAL | 0 refills | Status: DC

## 2016-09-03 NOTE — Care Management Note (Signed)
Case Management Note  Patient Details  Name: Kehinde Bowdish MRN: [id number] Date of Birth: Jan 16, 1956  Subjective/Objective:    Pt admitted with GI bleed                Action/Plan:  PTA independent from home alone.  Pt is active with PCP service and denied barriers to obtaining medications as ordered.  NO CM needs determined prior to discharge   Expected Discharge Date:  09/03/16               Expected Discharge Plan:  Home/Self Care (lives alone)  In-House Referral:     Discharge planning Services  CM Consult  Post Acute Care Choice:    Choice offered to:     DME Arranged:    DME Agency:     HH Arranged:    [place] Agency:     Status of Service:  Completed, signed off  If discussed at [employer], dates discussed:    Additional Comments:  Maryclare Labrador, RN 09/03/2016, 12:09 PM

## 2016-09-03 NOTE — Progress Notes (Signed)
Patient discharge teaching given, including activity, diet, follow-up appoints, and medications. Patient verbalized understanding of all discharge instructions. IV access was d/c'd. Vitals are stable. Skin is intact except as charted in most recent assessments. Pt to be escorted out by NT, to be driven home by family. Taken to security to receive patient belongings.

## 2016-09-03 NOTE — Care Management Important Message (Signed)
Important Message  Patient Details  Name: Melvin Hudson MRN: [id number] Date of Birth: 1955/04/17   Medicare Important Message Given:  Yes    Yomara Toothman Abena 09/03/2016, 10:21 AM

## 2016-09-04 DIAGNOSIS — E6609 Other obesity due to excess calories: Secondary | ICD-10-CM

## 2016-09-04 NOTE — Discharge Summary (Signed)
Discharge Summary  Melvin Hudson EUM:[id number] DOB: Jun 29, 1955  PCP: [name], MD  Admit date: 08/30/2016 Discharge date: 09/03/2016  Time spent: 25 minutes   Recommendations for Outpatient Follow-up:  1.  New medication: Proctofoam apply rectally when necessary for bleeding hemorrhoids 2. Patient will follow up with GI 3. Patient is an appointment to establish with PCP  Discharge Diagnoses:  Active Hospital Problems   Diagnosis Date Noted  . BRBPR (bright red blood per rectum) 08/30/2016  . History of GI diverticular bleed 04/18/2014  . OSA (obstructive sleep apnea) 11/19/2011  . HTN (hypertension) 09/17/2011    Resolved Hospital Problems   Diagnosis Date Noted Date Resolved  No resolved problems to display.    Discharge Condition: Improved, being discharged home   Diet recommendation: Low-sodium diet   Vitals:   09/02/16 2152 09/03/16 0619  BP: (!) 141/95 (!) 161/92  Pulse: 72 63  Resp: 18 18  Temp: 98 F (36.7 C) 98 F (36.7 C)    History of present illness:  61 year old male past mental history of hypertension and previous diverticular bleed that required multiple units of transfusion presented to the emergency room on 7/14 with bright red blood per rectum had been starting earlier afternoon. At time of admission, hemoglobin was 11.7 on admission and vital signs were stable. Patient brought into the hospitalist service for further observation. Dr. Michail Sermon from [facility] gastroenterology was consulted  Hospital Course:  Principal Problem:   BRBPR (bright red blood per rectum) in patient with history of GI diverticular bleed: Patient had previous colonoscopies that had not been able to find a source. Started on Protonix. Underwent a nuclear medicine tagged red blood cell scan which noted active GI bleeding the proximal to mid sigmoid colon which could be from diverticulum versus mass versus vascular. In discussion with interventional radiology, it was believed the  patient's bleeding with [place] not off and CT angiogram would be   of low yield would not be able to embolize. If bleeding recurred, patient became with endocrine stable, and which radiology can emergently take patient for embolization.  By 7/16, hemoglobin dropped down to 9.7. Transfuse 1 unit. GI felt no further procedure was needed. Hemoglobin remains stable. Patient was given Proctofoam for some bleeding which did recur which felt to be hemorrhoidal but did not affect patient's hemoglobin significantly. Patient was discharged on 7/19 with outpatient follow-up with GI. Active Problems:   HTN (hypertension): Continue antihypertensives. Needs outpatient PCP, given referral.   OSA (obstructive sleep apnea): When necessary CPAP Morbid obesity: Patient meets criteria with BMI greater than 40   Procedures:  7/15 Tagged nuclear medicine red blood cell scan   7/16 1 unit packed red blood cell transfusion  Consultations:   Interventional radiology  GI   Discharge Exam: BP (!) 161/92 (BP Location: Left Arm)   Pulse 63   Temp 98 F (36.7 C)   Resp 18   Ht 6\' 1"  (1.854 m)   Wt (!) 147 kg (324 lb)   SpO2 99%   BMI 42.75 kg/m   General: alert and oriented 3, no acute distress  Cardiovascular: regular rate and rhythm, S1-S2  Respiratory: clear to auscultation bilaterally   Discharge Instructions You were cared for by a hospitalist during your hospital stay. If you have any questions about your discharge medications or the care you received while you were in the hospital after you are discharged, you can call the unit and asked to speak with the hospitalist on call if the  hospitalist that took care of you is not available. Once you are discharged, your primary care physician will handle any further medical issues. Please note that NO REFILLS for any discharge medications will be authorized once you are discharged, as it is imperative that you return to your primary care physician (or establish  a relationship with a primary care physician if you do not have one) for your aftercare needs so that they can reassess your need for medications and monitor your lab values.  Discharge Instructions    Diet - low sodium heart healthy    Complete by:  As directed    Increase activity slowly    Complete by:  As directed      Allergies as of 09/03/2016      Reactions   Advil [ibuprofen]    Feels "jittery"   Azilsartan Swelling   Avoid ARB and ACEI per MD      Medication List    TAKE these medications   allopurinol 300 MG tablet Commonly known as:  ZYLOPRIM Take 300 mg by mouth daily as needed.   aspirin 81 MG tablet Take 81 mg by mouth every other day.   calcium citrate-vitamin D 500-500 MG-UNIT chewable tablet Chew 1 tablet by mouth 2 (two) times daily.   carvedilol 25 MG tablet Commonly known as:  COREG Take 25 mg by mouth 2 (two) times daily with a meal.   cetirizine 10 MG tablet Commonly known as:  ZYRTEC Take 10 mg by mouth daily as needed.   colchicine 0.6 MG tablet Take 0.6 mg by mouth daily as needed.   diphenhydrAMINE 25 MG tablet Commonly known as:  BENADRYL Take 1 tablet (25 mg total) by mouth every 6 (six) hours. What changed:  when to take this  reasons to take this   eplerenone 50 MG tablet Commonly known as:  INSPRA Take 50 mg by mouth daily.   furosemide 40 MG tablet Commonly known as:  LASIX Take 40 mg by mouth 2 (two) times daily.   hydrocortisone-pramoxine rectal foam Commonly known as:  PROCTOFOAM-HC Place 1 applicator rectally 2 (two) times daily.   NIFEdipine 90 MG 24 hr tablet Commonly known as:  ADALAT CC Take 90 mg by mouth daily.   omeprazole 20 MG capsule Commonly known as:  PRILOSEC Take 20 mg by mouth as needed.   oxyCODONE-acetaminophen 10-325 MG tablet Commonly known as:  PERCOCET Take 1 tablet by mouth every 6 (six) hours as needed for pain. What changed:  when to take this   polyethylene glycol packet Commonly  known as:  MIRALAX / GLYCOLAX Take 17 g by mouth daily.   sildenafil 20 MG tablet Commonly known as:  REVATIO Take 20 mg by mouth as needed.      Allergies  Allergen Reactions  . Advil [Ibuprofen]     Feels "jittery"  . Azilsartan Swelling    Avoid ARB and ACEI per MD   Follow-up Information    Wilford Corner, MD. Schedule an appointment as soon as possible for a visit in 8 week(s).   Specialty:  Gastroenterology Why:  Schedule follow-up with Dr. Wilford Corner in 8 weeks lower GI bleed  Follow up Sept. 11, 2018 @9 :45 Am Contact information: [address] [place] [address] [phone]        [name], MD. Schedule an appointment as soon as possible for a visit in 2 week(s).   Specialty:  Family Medicine Why:  Schedule follow-up 1 to  2 weeks Dr [name] essential hypertension, OSA, lower GI bleed    For September 16, 2016 @ 10A Contact information: [address] [place]  [address] [phone]            The results of significant diagnostics from this hospitalization (including imaging, microbiology, ancillary and laboratory) are listed below for reference.    Significant Diagnostic Studies: Nm Gi Blood Loss  Result Date: 08/31/2016 CLINICAL DATA:  Bright red rectal bleeding. History of a gastrointestinal bleed in 2016 with no source found. EXAM: NUCLEAR MEDICINE GASTROINTESTINAL BLEEDING SCAN TECHNIQUE: Sequential abdominal images were obtained following intravenous administration of Tc-50m labeled red blood cells. RADIOPHARMACEUTICALS:  25.6 mCi Tc-89m in-vitro labeled red cells. COMPARISON:  04/12/2014 and visceral arteriogram dated 04/11/2014. FINDINGS: Active tracer extravasation in the sigmoid colon extending retrograde into the descending colon, splenic flexure and transverse colon and extending antegrade into the distal sigmoid colon and proximal rectum. IMPRESSION: Active gastrointestinal bleeding in the proximal to mid sigmoid  colon. This could be due to bleeding associated with a diverticulum, mass or vascular malformation. These results will be called to the ordering clinician or representative by the Radiologist Assistant, and communication documented in the PACS or zVision Dashboard. Electronically Signed   By: Claudie Revering M.D.   On: 08/31/2016 08:40    Microbiology: Recent Results (from the past 240 hour(s))  MRSA PCR Screening     Status: None   Collection Time: 09/01/16  3:27 PM  Result Value Ref Range Status   MRSA by PCR NEGATIVE NEGATIVE Final    Comment:        The GeneXpert MRSA Assay (FDA approved for NASAL specimens only), is one component of a comprehensive MRSA colonization surveillance program. It is not intended to diagnose MRSA infection nor to guide or monitor treatment for MRSA infections.      Labs: Basic Metabolic Panel:  Recent Labs Lab 08/30/16 2014 08/31/16 0427 08/31/16 1320  NA 139 139 139  K 3.4* 3.2* 3.4*  CL 102 103 104  CO2 29 30 28   GLUCOSE 156* 154* 129*  BUN 5* 6 7  CREATININE 1.01 0.98 0.87  CALCIUM 9.5 8.7* 8.7*  MG  --   --  2.0   Liver Function Tests:  Recent Labs Lab 08/30/16 2014  AST 21  ALT 21  ALKPHOS 145*  BILITOT 0.9  PROT 7.1  ALBUMIN 4.3   No results for input(s): LIPASE, AMYLASE in the last 168 hours. No results for input(s): AMMONIA in the last 168 hours. CBC:  Recent Labs Lab 08/30/16 2014 08/31/16 0427  09/02/16 1141 09/02/16 1804 09/03/16 0005 09/03/16 0531 09/03/16 1140  WBC 6.8 6.7  --   --   --   --   --   --   HGB 13.0 11.7*  < > 10.6* 10.6* 9.9* 9.7* 10.4*  HCT 37.6* 34.1*  < > 30.7* 30.4* 28.4* 28.1* 30.3*  MCV 82.1 83.0  --   --   --   --   --   --   PLT 242 216  --   --   --   --   --   --   < > = values in this interval not displayed. Cardiac Enzymes: No results for input(s): CKTOTAL, CKMB, CKMBINDEX, TROPONINI in the last 168 hours. BNP: BNP (last 3 results) No results for input(s): BNP in the last  8760 hours.  ProBNP (last 3 results) No results for input(s): PROBNP in the last 8760  hours.  CBG: No results for input(s): GLUCAP in the last 168 hours.     Signed:  Annita Brod, MD [facility] 09/04/2016, 9:55 AM

## 2016-12-01 ENCOUNTER — Encounter (HOSPITAL_COMMUNITY): Payer: Self-pay | Admitting: Dentistry

## 2016-12-01 ENCOUNTER — Ambulatory Visit (HOSPITAL_COMMUNITY): Payer: Medicaid - Dental | Admitting: Dentistry

## 2016-12-01 VITALS — BP 133/83 | HR 70 | Temp 98.4°F

## 2016-12-01 DIAGNOSIS — K117 Disturbances of salivary secretion: Secondary | ICD-10-CM

## 2016-12-01 DIAGNOSIS — K08109 Complete loss of teeth, unspecified cause, unspecified class: Secondary | ICD-10-CM

## 2016-12-01 DIAGNOSIS — C099 Malignant neoplasm of tonsil, unspecified: Secondary | ICD-10-CM

## 2016-12-01 DIAGNOSIS — R682 Dry mouth, unspecified: Secondary | ICD-10-CM

## 2016-12-01 DIAGNOSIS — Z923 Personal history of irradiation: Secondary | ICD-10-CM

## 2016-12-01 DIAGNOSIS — Z463 Encounter for fitting and adjustment of dental prosthetic device: Secondary | ICD-10-CM

## 2016-12-01 DIAGNOSIS — Z5189 Encounter for other specified aftercare: Secondary | ICD-10-CM

## 2016-12-01 DIAGNOSIS — K0820 Unspecified atrophy of edentulous alveolar ridge: Secondary | ICD-10-CM

## 2016-12-01 DIAGNOSIS — K000 Anodontia: Secondary | ICD-10-CM

## 2016-12-01 NOTE — Progress Notes (Signed)
12/01/2016  Patient Name:   Melvin Hudson Date of Birth:   12/09/1955 Medical Record Number: [id number]  BP 133/83 (BP Location: Left Arm)   Pulse 70   Temp 98.4 F (36.9 C) (Oral)   Melvin Hudson is a 61 year old male that presents for periodic oral exam and evaluation of dentures. Patient has a history of squamous cell carcinoma of the right tonsil. Patient had extraction of remaining teeth with alveoloplasty and lateral exostoses reductions on 07/06/2013. Patient then underwent radiation therapy from 07/26/2013 through 09/15/2013. Patient had upper and lower complete dentures fabricated and were inserted on 11/08/2014. Patient has been seen for multiple denture adjustment appointment since that time. The patient now presents for periodic oral examination and denture recall.  Medical Hx Update:  Past Medical History:  Diagnosis Date  . Angioedema 09/17/11   tongue and lips  . Arthritis    "both of my legs and feet" (01/05/2014)  . Hx of gout   . Hypertension   . Obesity   . OSA on CPAP   . S/P radiation therapy 07/26/2013-09/15/2013   Right tonsil/bilateral neck/ 7000 cGy  . Squamous cell carcinoma of right tonsil ([place]) 06/14/13  .  Past Surgical History:  Procedure Laterality Date  . COLONOSCOPY Left 04/11/2014   Procedure: COLONOSCOPY;  Surgeon: Arta Silence, MD;  Location: [facility] ENDOSCOPY;  Service: Endoscopy;  Laterality: Left;  . COLONOSCOPY N/A 04/20/2014   Procedure: COLONOSCOPY;  Surgeon: Lear Ng, MD;  Location: [facility] ENDOSCOPY;  Service: Endoscopy;  Laterality: N/A;  . COLONOSCOPY W/ BIOPSIES AND POLYPECTOMY     benign  . DIRECT LARYNGOSCOPY  01/05/2014  . DIRECT LARYNGOSCOPY N/A 01/05/2014   Procedure: DIRECT LARYNGOSCOPY AND BIOPSY;  Surgeon: [name], MD;  Location: [place];  Service: ENT;  Laterality: N/A;  . KNEE ARTHROSCOPY Right ~ 1994  . LEFT AND RIGHT HEART CATHETERIZATION WITH CORONARY/GRAFT ANGIOGRAM N/A 12/31/2010   Procedure: LEFT AND RIGHT HEART  CATHETERIZATION WITH Beatrix Fetters;  Surgeon: Laverda Page, MD;  Location: [facility] CATH LAB;  Service: Cardiovascular;  Laterality: N/A;  . MULTIPLE EXTRACTIONS WITH ALVEOLOPLASTY N/A 07/08/2013   Procedure: Extraction of tooth #'s 22, 27 with alveoloplasty and bilateal mandibular facial exostoses reductions;  Surgeon: Lenn Cal, DDS;  Location: [facility] ORS;  Service: Oral Surgery;  Laterality: N/A;  . MULTIPLE TOOTH EXTRACTIONS  ~ 2008    ALLERGIES/ADVERSE DRUG REACTIONS: Allergies  Allergen Reactions  . Advil [Ibuprofen]     Feels "jittery"  . Azilsartan Swelling    Avoid ARB and ACEI per MD    MEDICATIONS: Current Outpatient Prescriptions  Medication Sig Dispense Refill  . allopurinol (ZYLOPRIM) 300 MG tablet Take 300 mg by mouth daily as needed.     [name] aspirin 81 MG tablet Take 81 mg by mouth every other day.    . calcium citrate-vitamin D 500-500 MG-UNIT chewable tablet Chew 1 tablet by mouth 2 (two) times daily.    . carvedilol (COREG) 25 MG tablet Take 25 mg by mouth 2 (two) times daily with a meal.    . cetirizine (ZYRTEC) 10 MG tablet Take 10 mg by mouth daily as needed.  2  . colchicine 0.6 MG tablet Take 0.6 mg by mouth daily as needed.    . diphenhydrAMINE (BENADRYL) 25 MG tablet Take 1 tablet (25 mg total) by mouth every 6 (six) hours. (Patient taking differently: Take 25 mg by mouth every 6 (six) hours as needed. ) 20 tablet 0  . eplerenone (  INSPRA) 50 MG tablet Take 50 mg by mouth daily.  2  . furosemide (LASIX) 40 MG tablet Take 40 mg by mouth 2 (two) times daily.  2  . hydrocortisone-pramoxine (PROCTOFOAM-HC) rectal foam Place 1 applicator rectally 2 (two) times daily. 10 g 0  . NIFEdipine (ADALAT CC) 90 MG 24 hr tablet Take 90 mg by mouth daily.    [name] omeprazole (PRILOSEC) 20 MG capsule Take 20 mg by mouth as needed.  4  . oxyCODONE-acetaminophen (PERCOCET) 10-325 MG per tablet Take 1 tablet by mouth every 6 (six) hours as needed for pain. (Patient taking  differently: Take 1 tablet by mouth 4 (four) times daily. ) 120 tablet 0  . polyethylene glycol (MIRALAX / GLYCOLAX) packet Take 17 g by mouth daily. 14 each 0  . sildenafil (REVATIO) 20 MG tablet Take 20 mg by mouth as needed.     No current facility-administered medications for this visit.     C/C: Patient is not complaining of any denture irritation.   HPI:  Patient has a history of squamous cell carcinoma of the right tonsil. Patient had extraction of remaining teeth with alveoloplasty and lateral exostoses reductions on 07/06/2013. Patient then underwent radiation therapy from 07/26/2013 through 09/15/2013. Patient had upper and lower complete dentures fabricated and were inserted on 11/08/2014. Patient has been seen for multiple denture adjustment appointment since that time.  DENTAL EXAM: General: Patient well-developed, well-nourished male in no acute distress. Vitals: BP 133/83 (BP Location: Left Arm)   Pulse 70   Temp 98.4 F (36.9 C) (Oral)  Extraoral Exam: There is no palpable lymphadenopathy.  There are no TMJ Symptoms. Intraoral  Exam: Patient has xerostomia. There is no evidence of denture irritation. Dentition: The patient is edentulous. There is atrophy of the edentulous alveolar ridges. Prosthodontic: Patient has upper and lower complete dentures that are stable and retentive. Pressure indicating paste was applied to the upper and lower dentures. Adjustments were not needed. Occlusion:  Patient was evaluated in centric relation and protrusive strokes. No adjustments were made.  Patient tolerated the procedure well.   Assessments: 1. Patient is edentulous. 2. There is atrophy of the edentulous alveolar ridges. 3. Patient has xerostomia. 4. Stable and retentive upper and lower dentures.  Plan:  1. Keep dentures out if sore spots arise. Use salt water rinses as needed aid healing. 2. Return to clinic for denture adjustment as scheduled. Call if problems arise  before then.   Lenn Cal, DDS

## 2016-12-01 NOTE — Patient Instructions (Signed)
Plan:  1. Keep dentures out if sore spots arise. Use salt water rinses as needed aid healing. 2. Return to clinic for denture adjustment as scheduled. Call if problems arise before then.   Ronald F. Kulinski, DDS  

## 2017-04-03 NOTE — Progress Notes (Signed)
Melvin Hudson presents for follow up of radiation completed 09/15/13 to his Right Tonsil and bilateral neck.   Pain issues, if any: He reports bilateral leg pain left > right. He takes oxycodone 4 times daily for this pain.  Using a feeding tube?: no Weight changes, if any:  Wt Readings from Last 3 Encounters:  04/10/17 (!) 339 lb 12.8 oz (154.1 kg)  08/30/16 (!) 324 lb (147 kg)  04/11/16 (!) 323 lb 9.6 oz (146.8 kg)   Swallowing issues, if any: He can't eat rice because it gets caught in his throat. He denies any other difficulty with foods. He does drink water with meals.  Smoking or chewing tobacco? No Using fluoride trays daily?  Last ENT visit was on: Dr. Constance Holster 09/30/16. ? Next apt.  Other notable issues, if any:  TSH lab drawn today.   BP 129/87   Pulse 64   Temp 98 F (36.7 C)   Ht 6\' 1"  (1.854 m)   Wt (!) 339 lb 12.8 oz (154.1 kg)   SpO2 95% Comment: roomk air  BMI 44.83 kg/m

## 2017-04-10 ENCOUNTER — Ambulatory Visit
Admission: RE | Admit: 2017-04-10 | Discharge: 2017-04-10 | Disposition: A | Discharge status: Home / Self Care | Payer: Medicare Other | Source: Ambulatory Visit | Attending: Radiation Oncology | Admitting: Radiation Oncology

## 2017-04-10 ENCOUNTER — Encounter: Payer: Self-pay | Admitting: Radiation Oncology

## 2017-04-10 VITALS — BP 129/87 | HR 64 | Temp 98.0°F | Ht 73.0 in | Wt 339.8 lb

## 2017-04-10 DIAGNOSIS — E119 Type 2 diabetes mellitus without complications: Secondary | ICD-10-CM | POA: Insufficient documentation

## 2017-04-10 DIAGNOSIS — Z79891 Long term (current) use of opiate analgesic: Secondary | ICD-10-CM | POA: Insufficient documentation

## 2017-04-10 DIAGNOSIS — Z79899 Other long term (current) drug therapy: Secondary | ICD-10-CM | POA: Diagnosis not present

## 2017-04-10 DIAGNOSIS — R635 Abnormal weight gain: Secondary | ICD-10-CM

## 2017-04-10 DIAGNOSIS — Z923 Personal history of irradiation: Secondary | ICD-10-CM | POA: Diagnosis not present

## 2017-04-10 DIAGNOSIS — Z8589 Personal history of malignant neoplasm of other organs and systems: Secondary | ICD-10-CM | POA: Diagnosis not present

## 2017-04-10 DIAGNOSIS — Z7982 Long term (current) use of aspirin: Secondary | ICD-10-CM | POA: Diagnosis not present

## 2017-04-10 DIAGNOSIS — Z1329 Encounter for screening for other suspected endocrine disorder: Secondary | ICD-10-CM

## 2017-04-10 DIAGNOSIS — Z886 Allergy status to analgesic agent status: Secondary | ICD-10-CM | POA: Insufficient documentation

## 2017-04-10 DIAGNOSIS — E669 Obesity, unspecified: Secondary | ICD-10-CM | POA: Diagnosis not present

## 2017-04-10 DIAGNOSIS — C090 Malignant neoplasm of tonsillar fossa: Secondary | ICD-10-CM

## 2017-04-10 HISTORY — DX: Gastrointestinal hemorrhage, unspecified: K92.2

## 2017-04-10 LAB — TSH: TSH: 1.588 u[IU]/mL (ref 0.320–4.118)

## 2017-04-10 NOTE — Progress Notes (Signed)
Radiation Oncology         [phone]) [phone] ________________________________  Name: Melvin Hudson MRN: [id number]  Date: 04/10/2017  DOB: [birth year]  Follow-Up Visit Note  Outpatient  CC: [name], MD  [name], MD    ICD-10-CM   1. Malignant neoplasm of tonsillar fossa [facility]) C09.0     Diagnosis and Prior Radiotherapy:   [id number] Stage IVA right tonsil squamous cell carcinoma  07/26/2013 - 09/15/2013: Right tonsil and bilateral neck treated to 70 Gy in 35 fractions   CHIEF COMPLAINT: check-up for tonsil cancer  Narrative: The patient returns today for routine follow-up of radiation completed 09/15/2013 to his right tonsil and bilateral neck.  Pain issues, if any: He reports bilateral leg pain, left > right. He takes oxycodone 4 times daily for this pain.   Using a feeding tube?: No  Weight changes, if any:  Wt Readings from Last 3 Encounters:  04/10/17 (!) 339 lb 12.8 oz (154.1 kg)  08/30/16 (!) 324 lb (147 kg)  04/11/16 (!) 323 lb 9.6 oz (146.8 kg)   Swallowing issues, if any: He reports he can't eat rice because it gets stuck in his throat. He denies difficulty with any other foods. He does drink water with meals.   Smoking or chewing tobacco? No  Using fluoride trays daily? No- edentulous  Last ENT visit was on: 09/30/2016 with Dr. Constance Holster  Other notable issues, if any: TSH lab drawn today. He reports a recent diagnosis of diabetes and is taking Metformin.   ALLERGIES:  is allergic to advil [ibuprofen] and azilsartan.  Meds: Current Outpatient Medications  Medication Sig Dispense Refill  . allopurinol (ZYLOPRIM) 300 MG tablet Take 300 mg by mouth daily as needed.     [name] aspirin 81 MG tablet Take 81 mg by mouth every other day.    . calcium citrate-vitamin D 500-500 MG-UNIT chewable tablet Chew 1 tablet by mouth 2 (two) times daily.    . carvedilol (COREG) 25 MG tablet Take 25 mg by mouth 2 (two) times daily with a meal.    . cetirizine (ZYRTEC) 10 MG tablet Take 10  mg by mouth daily as needed.  2  . colchicine 0.6 MG tablet Take 0.6 mg by mouth daily as needed.    . diphenhydrAMINE (BENADRYL) 25 MG tablet Take 1 tablet (25 mg total) by mouth every 6 (six) hours. (Patient taking differently: Take 25 mg by mouth every 6 (six) hours as needed. ) 20 tablet 0  . eplerenone (INSPRA) 50 MG tablet Take 50 mg by mouth daily.  2  . furosemide (LASIX) 40 MG tablet Take 40 mg by mouth 2 (two) times daily.  2  . hydrocortisone-pramoxine (PROCTOFOAM-HC) rectal foam Place 1 applicator rectally 2 (two) times daily. 10 g 0  . NIFEdipine (ADALAT CC) 90 MG 24 hr tablet Take 90 mg by mouth daily.    [name] omeprazole (PRILOSEC) 20 MG capsule Take 20 mg by mouth as needed.  4  . oxyCODONE-acetaminophen (PERCOCET) 10-325 MG per tablet Take 1 tablet by mouth every 6 (six) hours as needed for pain. (Patient taking differently: Take 1 tablet by mouth 4 (four) times daily. ) 120 tablet 0  . polyethylene glycol (MIRALAX / GLYCOLAX) packet Take 17 g by mouth daily. 14 each 0  . sildenafil (REVATIO) 20 MG tablet Take 20 mg by mouth as needed.     No current facility-administered medications for this encounter.     Physical Findings:  height is 6\' 1"  (  1.854 m) and weight is 339 lb 12.8 oz (154.1 kg) (abnormal). His temperature is 98 F (36.7 C). His blood pressure is 129/87 and his pulse is 64. His oxygen saturation is 95%.   General: Alert and oriented, in no acute distress. HEENT: Head is normocephalic. Edentulous. Dentures removed for oral exam. Oropharynx is clear. Oral cavity is clear.  Neck: Neck is supple, no palpable cervical or supraclavicular lymphadenopathy. Heart: Regular in rate and rhythm with no murmurs, rubs, or gallops. Chest: Clear to auscultation bilaterally, with no rhonchi, wheezes, or rales. Abdomen: Soft, nontender, nondistended, with no rigidity or guarding. Normal bowel sounds. Lymphatics: see Neck Exam Skin: No concerning lesions. Skin over his neck has healed  well.    Lab Findings: Lab Results  Component Value Date   WBC 6.7 08/31/2016   HGB 10.4 (L) 09/03/2016   HCT 30.3 (L) 09/03/2016   MCV 83.0 08/31/2016   PLT 216 08/31/2016   Lab Results  Component Value Date   TSH 1.588 04/10/2017   Radiographic Findings: No results found.  Impression/Plan:  No evidence of disease recurrence at this time. Doing well.  TSH: Within normal limits.  Obesity and Diabetes: I encouraged him to start a regular exercise routine to help him lose weight and help keep his blood sugar low. He has a stationary bicycle at home. He was educated to start slow with 5 minutes 3 times per day and then increase the time every week.  Goal is not merely weight loss, but fitness.  Follow Up: The patient will follow up with Dr. Constance Holster in 3-4 months. He will follow up with me in 1 year. I look forward to seeing him at the Survivorship celebration in April.  I spent 20 minutes face to face with the patient and more than 50% of that time was spent in counseling and/or coordination of care.  _____________________________________   Eppie Gibson, MD  This document serves as a record of services personally performed by Eppie Gibson, MD. It was created on her behalf by Rae Lips, a trained medical scribe. The creation of this record is based on the scribe's personal observations and the provider's statements to them. This document has been checked and approved by the attending provider.

## 2017-04-17 ENCOUNTER — Telehealth: Payer: Self-pay | Admitting: Pulmonary Disease

## 2017-04-17 DIAGNOSIS — G4733 Obstructive sleep apnea (adult) (pediatric): Secondary | ICD-10-CM

## 2017-04-17 NOTE — Telephone Encounter (Signed)
VS can we send in RX for CPAP? It has been over a year since he was seen. Please advise,.\   Patient Instructions by Chesley Mires, MD at 02/22/2016 11:45 AM   Author: Chesley Mires, MD Author Type: Physician Filed: 02/22/2016 11:38 AM  Note Status: Signed Cosign: Cosign Not Required Encounter Date: 02/22/2016  Editor: Chesley Mires, MD (Physician)    Follow up in 1 year    Instructions      Return in about 1 year (around 02/21/2017).  Follow up in 1 year     After Visit Summary (Printed 02/22/2016)

## 2017-04-17 NOTE — Telephone Encounter (Signed)
Okay to send order for auto CPAP range 5 to 20 cm H2O with heated humidity and mask of choice.  He needs ROV scheduled also.  Can by with me or Nurse Practitioner.

## 2017-04-17 NOTE — Telephone Encounter (Signed)
We received a fax from [employer] requesting a Doctor Evaluation dated within 6 months prior to the machine Rx which indicates patient is benefiting and compliant.  VS has not seen pt since 02/2016.  I called Latrice at [facility] at [phone] and told her most current ov is 02/2016.  She states to hold the order and she will call pt & make him aware they can not fill it until he is seen.  I gave paperwork to [facility] & she is placing forms in VS' middle cubbie.

## 2017-04-17 NOTE — Telephone Encounter (Signed)
Order placed for new cpap.    lmtcb for pt to schedule rov.

## 2017-04-20 NOTE — Telephone Encounter (Signed)
Lm X2 for pt to schedule OV>

## 2017-04-21 NOTE — Telephone Encounter (Signed)
Patient has been scheduled for appointment so that we can get the CPAP machine. Nothing further needed at this time.

## 2017-04-28 ENCOUNTER — Encounter: Payer: Self-pay | Admitting: Adult Health

## 2017-04-29 ENCOUNTER — Encounter: Payer: Self-pay | Admitting: Adult Health

## 2017-04-29 ENCOUNTER — Ambulatory Visit (INDEPENDENT_AMBULATORY_CARE_PROVIDER_SITE_OTHER): Payer: Medicare Other | Admitting: Adult Health

## 2017-04-29 DIAGNOSIS — G4733 Obstructive sleep apnea (adult) (pediatric): Secondary | ICD-10-CM

## 2017-04-29 NOTE — Addendum Note (Signed)
Addended by: Dolores Lory on: 04/29/2017 12:26 PM   Modules accepted: Orders

## 2017-04-29 NOTE — Patient Instructions (Addendum)
Continue on CPAP at bedtime-try to wear each night for at least 6hr  Order for new CPAP machine .  Work on healthy weight Do not drive sleepy .  Follow-up in 1 year with Dr. Halford Chessman  And As needed

## 2017-04-29 NOTE — Addendum Note (Signed)
Addended by: Dolores Lory on: 04/29/2017 12:21 PM   Modules accepted: Orders

## 2017-04-29 NOTE — Assessment & Plan Note (Signed)
Wt loss  

## 2017-04-29 NOTE — Progress Notes (Signed)
Reviewed and agree with assessment/plan.   Geanette Buonocore, MD [facility] 02/13/2016, 12:24 PM Pager:  [phone]  

## 2017-04-29 NOTE — Progress Notes (Signed)
@Patient  ID: Melvin Hudson, male    DOB: Mar 09, 1955, 62 y.o.   MRN: [id number]  Chief Complaint  Patient presents with  . Follow-up    OSA     Referring provider: [name], MD  HPI: 62 yo male followed for OSA  Tonsillar cancer (squamous cell s/p XRT 2015)    Sleep tests PSG 12/01/11 >> AHI 43 CPAP 06/02/14 to 07/01/14 >>used on 26 of 30 nights with average 6 hrs and 51 min. Average AHI is 0.6 with CPAP 14 cm H2O  04/29/2017 Follow up ; OSA  Patient returns for a follow-up for severe sleep apnea on CPAP at bedtime.  Patient says he is doing well CPAP.  He denies any significant daytime sleepiness. Download shows good compliance with average usage at 5 hours.  AHI 0.7.  Minimum leaks.  Patient is on CPAP 14 cmH20 Patient is requesting a new machine feels that CPAP machine is getting old and not working as well.   Allergies  Allergen Reactions  . Advil [Ibuprofen]     Feels "jittery"  . Azilsartan Swelling    Avoid ARB and ACEI per MD    Immunization History  Administered Date(s) Administered  . Influenza Split 11/12/2011, 11/08/2012, 11/17/2016  . Influenza,inj,Quad PF,6+ Mos 11/18/2015  . Influenza-Unspecified 11/24/2013    Past Medical History:  Diagnosis Date  . Angioedema 09/17/11   tongue and lips  . Arthritis    "both of my legs and feet" (01/05/2014)  . GI bleed   . Hx of gout   . Hypertension   . Obesity   . OSA on CPAP   . S/P radiation therapy 07/26/2013-09/15/2013   Right tonsil/bilateral neck/ 7000 cGy  . Squamous cell carcinoma of right tonsil ([place]) 06/14/13    Tobacco History: Social History   Tobacco Use  Smoking Status Former Smoker  . Years: 42.00  . Types: Cigarettes, Cigars  . Last attempt to quit: 06/17/2013  . Years since quitting: 3.8  Smokeless Tobacco Never Used  Tobacco Comment   "Quit smoking cigarettes in ~  2007"   Counseling given: Not Answered Comment: "Quit smoking cigarettes in ~  2007"   Outpatient Encounter  Medications as of 04/29/2017  Medication Sig  . allopurinol (ZYLOPRIM) 300 MG tablet Take 300 mg by mouth daily as needed.   [name] aspirin 81 MG tablet Take 81 mg by mouth every other day.  . calcium citrate-vitamin D 500-500 MG-UNIT chewable tablet Chew 1 tablet by mouth 2 (two) times daily.  . carvedilol (COREG) 25 MG tablet Take 25 mg by mouth 2 (two) times daily with a meal.  . cetirizine (ZYRTEC) 10 MG tablet Take 10 mg by mouth daily as needed.  . colchicine 0.6 MG tablet Take 0.6 mg by mouth daily as needed.  . diphenhydrAMINE (BENADRYL) 25 MG tablet Take 1 tablet (25 mg total) by mouth every 6 (six) hours. (Patient taking differently: Take 25 mg by mouth every 6 (six) hours as needed. )  . eplerenone (INSPRA) 50 MG tablet Take 50 mg by mouth daily.  . furosemide (LASIX) 40 MG tablet Take 40 mg by mouth 2 (two) times daily.  . hydrocortisone-pramoxine (PROCTOFOAM-HC) rectal foam Place 1 applicator rectally 2 (two) times daily.  [name] NIFEdipine (ADALAT CC) 90 MG 24 hr tablet Take 90 mg by mouth daily.  [name] omeprazole (PRILOSEC) 20 MG capsule Take 20 mg by mouth as needed.  [name] oxyCODONE-acetaminophen (PERCOCET) 10-325 MG per tablet Take 1 tablet by mouth every  6 (six) hours as needed for pain. (Patient taking differently: Take 1 tablet by mouth 4 (four) times daily. )  . polyethylene glycol (MIRALAX / GLYCOLAX) packet Take 17 g by mouth daily.  . sildenafil (REVATIO) 20 MG tablet Take 20 mg by mouth as needed.   No facility-administered encounter medications on file as of 04/29/2017.      Review of Systems  Constitutional:   No  weight loss, night sweats,  Fevers, chills, fatigue, or  lassitude.  HEENT:   No headaches,  Difficulty swallowing,  Tooth/dental problems, or  Sore throat,                No sneezing, itching, ear ache, nasal congestion, post nasal drip,   CV:  No chest pain,  Orthopnea, PND, swelling in lower extremities, anasarca, dizziness, palpitations, syncope.   GI  No  heartburn, indigestion, abdominal pain, nausea, vomiting, diarrhea, change in bowel habits, loss of appetite, bloody stools.   Resp: No shortness of breath with exertion or at rest.  No excess mucus, no productive cough,  No non-productive cough,  No coughing up of blood.  No change in color of mucus.  No wheezing.  No chest wall deformity  Skin: no rash or lesions.  GU: no dysuria, change in color of urine, no urgency or frequency.  No flank pain, no hematuria   MS:  No joint pain or swelling.  No decreased range of motion.  No back pain.    Physical Exam  BP 130/88 (BP Location: Left Arm, Cuff Size: Large)   Pulse 71   Ht 6\' 1"  (1.854 m)   Wt (!) 336 lb 6.4 oz (152.6 kg)   SpO2 98%   BMI 44.38 kg/m   GEN: A/Ox3; pleasant , NAD, obese    HEENT:  [place]/AT,  EACs-clear, TMs-wnl, NOSE-clear, THROAT-clear, no lesions, no postnasal drip or exudate noted. Class 2-3  MP airway , edentulous   NECK:  Supple w/ fair ROM; no JVD; normal carotid impulses w/o bruits; no thyromegaly or nodules palpated; no lymphadenopathy.    RESP  Clear  P & A; w/o, wheezes/ rales/ or rhonchi. no accessory muscle use, no dullness to percussion  CARD:  RRR, no m/r/g, tr peripheral edema, pulses intact, no cyanosis or clubbing.  GI:   Soft & nt; nml bowel sounds; no organomegaly or masses detected.   Musco: Warm bil, no deformities or joint swelling noted.   Neuro: alert, no focal deficits noted.    Skin: Warm, no lesions or rashes    Lab Results:   BMET  BNP No results found for: BNP  ProBNP No results found for: PROBNP  Imaging: No results found.   Assessment & Plan:   OSA (obstructive sleep apnea) Order for new machine   Plan  Patient Instructions  Continue on CPAP at bedtime-try to wear each night for at least 6hr  Order for new CPAP machine .  Work on healthy weight Do not drive sleepy .  Follow-up in 1 year with Dr. Halford Chessman  And As needed       Obesity, Class III, BMI 40-49.9  (morbid obesity) ([place]) Wt loss      Rexene Edison, NP 04/29/2017

## 2017-04-29 NOTE — Assessment & Plan Note (Signed)
Order for new machine   Plan  Patient Instructions  Continue on CPAP at bedtime-try to wear each night for at least 6hr  Order for new CPAP machine .  Work on healthy weight Do not drive sleepy .  Follow-up in 1 year with Dr. Halford Chessman  And As needed

## 2017-05-13 ENCOUNTER — Telehealth: Payer: Self-pay | Admitting: Pulmonary Disease

## 2017-05-13 DIAGNOSIS — G4733 Obstructive sleep apnea (adult) (pediatric): Secondary | ICD-10-CM

## 2017-05-13 NOTE — Telephone Encounter (Addendum)
[place] and spoke with Isaias Sakai telling her the message we had received from [facility] about faxing the sleep study and cpap order.  I stated to Isaias Sakai that I had checked both fax machines as well as VS's lookout folder plus the folder up front and we have not received anything on pt.  Shay expressed understanding and stated they would fax the documents again to both of our fax numbers.  I verified with Isaias Sakai our fax numbers.  Will leave this encounter open until documents have been received for pt and given to VS.  Routing this to [facility] box.

## 2017-05-14 NOTE — Telephone Encounter (Signed)
Ok per JJ to send in order of CPAP supplies. Will route this to [place] for follow up on paperwork.

## 2017-05-14 NOTE — Telephone Encounter (Signed)
Form signed by TP and faxed back to [facility] with the requested copy of sleep study Faxed to [phone]

## 2017-05-14 NOTE — Telephone Encounter (Signed)
Joe with [employer], CB is [phone], requesting RX for CPAP and copy of sleep study. Fax [phone].

## 2017-12-15 ENCOUNTER — Ambulatory Visit (HOSPITAL_COMMUNITY): Payer: Medicaid - Dental | Admitting: Dentistry

## 2017-12-15 ENCOUNTER — Encounter (HOSPITAL_COMMUNITY): Payer: Self-pay | Admitting: Dentistry

## 2017-12-15 VITALS — BP 132/90 | HR 72 | Temp 98.0°F

## 2017-12-15 DIAGNOSIS — K000 Anodontia: Secondary | ICD-10-CM

## 2017-12-15 DIAGNOSIS — C099 Malignant neoplasm of tonsil, unspecified: Secondary | ICD-10-CM | POA: Diagnosis not present

## 2017-12-15 DIAGNOSIS — K117 Disturbances of salivary secretion: Secondary | ICD-10-CM

## 2017-12-15 DIAGNOSIS — R682 Dry mouth, unspecified: Secondary | ICD-10-CM

## 2017-12-15 DIAGNOSIS — Z923 Personal history of irradiation: Secondary | ICD-10-CM | POA: Diagnosis not present

## 2017-12-15 DIAGNOSIS — K08109 Complete loss of teeth, unspecified cause, unspecified class: Secondary | ICD-10-CM

## 2017-12-15 DIAGNOSIS — Z463 Encounter for fitting and adjustment of dental prosthetic device: Secondary | ICD-10-CM

## 2017-12-15 NOTE — Progress Notes (Signed)
12/15/2017  Patient Name:   Melvin Hudson Date of Birth:   [birth year] Medical Record Number: [id number]  BP 132/90 (BP Location: Left Arm)   Pulse 72   Temp 98 F (36.7 C)   Melvin Hudson is a 62 year old male that presents for periodic oral exam and evaluation of dentures. Patient has a history of squamous cell carcinoma of the right tonsil. Patient had extraction of remaining teeth with alveoloplasty and lateral exostoses reductions on 07/06/2013. Patient then underwent radiation therapy from 07/26/2013 through 09/15/2013. Patient had upper and lower complete dentures fabricated and were inserted on 11/08/2014. Patient has been seen for multiple denture adjustment appointment since that time. The patient now presents for periodic oral examination and denture recall. Patient was recently seen by a pulmonologist and a new CPAP Machine was ordered. Patient is using his CPAP nightly as instructed. Patient's sister passed away previously. Patient has a sister and niece in [place], [place].  Medical Hx Update:  Past Medical History:  Diagnosis Date  . Angioedema 09/17/11   tongue and lips  . Arthritis    "both of my legs and feet" (01/05/2014)  . GI bleed   . Hx of gout   . Hypertension   . Obesity   . OSA on CPAP   . S/P radiation therapy 07/26/2013-09/15/2013   Right tonsil/bilateral neck/ 7000 cGy  . Squamous cell carcinoma of right tonsil ([place]) 06/14/13  .  Past Surgical History:  Procedure Laterality Date  . COLONOSCOPY Left 04/11/2014   Procedure: COLONOSCOPY;  Surgeon: Arta Silence, MD;  Location: [facility] ENDOSCOPY;  Service: Endoscopy;  Laterality: Left;  . COLONOSCOPY N/A 04/20/2014   Procedure: COLONOSCOPY;  Surgeon: Lear Ng, MD;  Location: [facility] ENDOSCOPY;  Service: Endoscopy;  Laterality: N/A;  . COLONOSCOPY W/ BIOPSIES AND POLYPECTOMY     benign  . DIRECT LARYNGOSCOPY  01/05/2014  . DIRECT LARYNGOSCOPY N/A 01/05/2014   Procedure: DIRECT LARYNGOSCOPY AND BIOPSY;   Surgeon: [name], MD;  Location: [place];  Service: ENT;  Laterality: N/A;  . KNEE ARTHROSCOPY Right ~ 1994  . LEFT AND RIGHT HEART CATHETERIZATION WITH CORONARY/GRAFT ANGIOGRAM N/A 12/31/2010   Procedure: LEFT AND RIGHT HEART CATHETERIZATION WITH Beatrix Fetters;  Surgeon: Laverda Page, MD;  Location: [facility] CATH LAB;  Service: Cardiovascular;  Laterality: N/A;  . MULTIPLE EXTRACTIONS WITH ALVEOLOPLASTY N/A 07/08/2013   Procedure: Extraction of tooth #'s 22, 27 with alveoloplasty and bilateal mandibular facial exostoses reductions;  Surgeon: Lenn Cal, DDS;  Location: [facility] ORS;  Service: Oral Surgery;  Laterality: N/A;  . MULTIPLE TOOTH EXTRACTIONS  ~ 2008    ALLERGIES/ADVERSE DRUG REACTIONS: Allergies  Allergen Reactions  . Advil [Ibuprofen]     Feels "jittery"  . Azilsartan Swelling    Avoid ARB and ACEI per MD    MEDICATIONS: Current Outpatient Medications  Medication Sig Dispense Refill  . allopurinol (ZYLOPRIM) 300 MG tablet Take 300 mg by mouth daily as needed.     [name] aspirin 81 MG tablet Take 81 mg by mouth every other day.    . calcium citrate-vitamin D 500-500 MG-UNIT chewable tablet Chew 1 tablet by mouth 2 (two) times daily.    . carvedilol (COREG) 25 MG tablet Take 25 mg by mouth 2 (two) times daily with a meal.    . cetirizine (ZYRTEC) 10 MG tablet Take 10 mg by mouth daily as needed.  2  . colchicine 0.6 MG tablet Take 0.6 mg by mouth daily as needed.    [name]  diphenhydrAMINE (BENADRYL) 25 MG tablet Take 1 tablet (25 mg total) by mouth every 6 (six) hours. (Patient taking differently: Take 25 mg by mouth every 6 (six) hours as needed. ) 20 tablet 0  . eplerenone (INSPRA) 50 MG tablet Take 50 mg by mouth daily.  2  . furosemide (LASIX) 40 MG tablet Take 40 mg by mouth 2 (two) times daily.  2  . hydrocortisone-pramoxine (PROCTOFOAM-HC) rectal foam Place 1 applicator rectally 2 (two) times daily. 10 g 0  . NIFEdipine (ADALAT CC) 90 MG 24 hr tablet Take 90 mg by  mouth daily.    [name] omeprazole (PRILOSEC) 20 MG capsule Take 20 mg by mouth as needed.  4  . oxyCODONE-acetaminophen (PERCOCET) 10-325 MG per tablet Take 1 tablet by mouth every 6 (six) hours as needed for pain. (Patient taking differently: Take 1 tablet by mouth 4 (four) times daily. ) 120 tablet 0  . polyethylene glycol (MIRALAX / GLYCOLAX) packet Take 17 g by mouth daily. 14 each 0  . sildenafil (REVATIO) 20 MG tablet Take 20 mg by mouth as needed.     No current facility-administered medications for this visit.     C/C: Patient is not complaining of any denture irritation.   HPI:  Patient has a history of squamous cell carcinoma of the right tonsil. Patient had extraction of remaining teeth with alveoloplasty and lateral exostoses reductions on 07/06/2013. Patient then underwent radiation therapy from 07/26/2013 through 09/15/2013. Patient had upper and lower complete dentures fabricated and were inserted on 11/08/2014. Patient has been seen for multiple denture adjustment appointment since that time.  DENTAL EXAM: General: Patient well-developed, well-nourished male in no acute distress. Vitals: BP 132/90 (BP Location: Left Arm)   Pulse 72   Temp 98 F (36.7 C)  Extraoral Exam: There is no palpable lymphadenopathy.  There are no TMJ Symptoms. Intraoral  Exam: Patient has xerostomia. There is no evidence of denture irritation. Dentition: The patient is edentulous. There is atrophy of the lower edentulous alveolar ridges. Prosthodontic: Patient has upper and lower complete dentures that are stable and retentive. Pressure indicating paste was applied to the upper and lower dentures. Minimal adjustments were needed. [place] Occlusion:  Patient was evaluated in centric relation and protrusive strokes. No adjustments were made.  Patient tolerated the procedure well.   Assessments: 1. Patient is edentulous. 2. There is atrophy of the edentulous alveolar ridges. 3. Patient has  xerostomia. 4. Stable and retentive upper and lower dentures.  Plan:  1. Keep dentures out if sore spots arise. Use salt water rinses as needed aid healing. 2. Return to clinic for denture adjustment as scheduled. Call if problems arise before then.   Lenn Cal, DDS

## 2017-12-15 NOTE — Patient Instructions (Signed)
Plan:  1. Keep dentures out if sore spots arise. Use salt water rinses as needed aid healing. 2. Return to clinic for denture adjustment as scheduled. Call if problems arise before then.   Lenn Cal, DDS

## 2018-01-27 ENCOUNTER — Telehealth: Payer: Self-pay | Admitting: Pulmonary Disease

## 2018-01-27 DIAGNOSIS — G4733 Obstructive sleep apnea (adult) (pediatric): Secondary | ICD-10-CM

## 2018-01-27 DIAGNOSIS — Z9989 Dependence on other enabling machines and devices: Principal | ICD-10-CM

## 2018-01-27 NOTE — Telephone Encounter (Addendum)
Spoke with pt, he states his CPAP pressure is too high at 14cm. He states his nose is really dry and he has nosebleeds. I called [facility] to obtain download but they do not have any updated data and the last reading was done in August. I asked the pt did he have a SD card and he responded and stated that we should be able to see it online. The rep at [facility] will mail an [place] card but pt has to send them the one in his machine if he has one. Pt states he does not have a SD card. Dr. Halford Chessman please advise if there are any recommendations for pt.

## 2018-01-28 NOTE — Telephone Encounter (Signed)
Can send order to change CPAP to 11 cm H2O.  Have download sent 2 weeks after this pressure change.

## 2018-01-28 NOTE — Telephone Encounter (Signed)
Called pt and advised message from the provider. Pt understood and verbalized understanding. Nothing further is needed.   Order placed for pressure change to [employer].

## 2018-02-01 ENCOUNTER — Emergency Department (HOSPITAL_COMMUNITY)
Admission: EM | Admit: 2018-02-01 | Discharge: 2018-02-02 | Disposition: A | Discharge status: Home / Self Care | Payer: Medicare Other | Attending: Emergency Medicine | Admitting: Emergency Medicine

## 2018-02-01 ENCOUNTER — Encounter (HOSPITAL_COMMUNITY): Payer: Self-pay | Admitting: Emergency Medicine

## 2018-02-01 DIAGNOSIS — Z87891 Personal history of nicotine dependence: Secondary | ICD-10-CM | POA: Diagnosis not present

## 2018-02-01 DIAGNOSIS — Z8589 Personal history of malignant neoplasm of other organs and systems: Secondary | ICD-10-CM | POA: Diagnosis not present

## 2018-02-01 DIAGNOSIS — Z79899 Other long term (current) drug therapy: Secondary | ICD-10-CM | POA: Insufficient documentation

## 2018-02-01 DIAGNOSIS — I10 Essential (primary) hypertension: Secondary | ICD-10-CM | POA: Diagnosis not present

## 2018-02-01 DIAGNOSIS — R040 Epistaxis: Secondary | ICD-10-CM | POA: Diagnosis present

## 2018-02-01 DIAGNOSIS — Z7982 Long term (current) use of aspirin: Secondary | ICD-10-CM | POA: Diagnosis not present

## 2018-02-01 MED ORDER — OXYMETAZOLINE HCL 0.05 % NA SOLN
2.0000 | Freq: Once | NASAL | Status: AC
  Administered 2018-02-02: 2 via NASAL
  Filled 2018-02-01: qty 15

## 2018-02-01 NOTE — Discharge Instructions (Signed)
Use 2 sprays of Afrin in your left nostril once a day for the next three days. DO NOT USE FOR LONGER THAN 3 DAYS. Follow up with your primary care doctor as needed.

## 2018-02-01 NOTE — ED Triage Notes (Signed)
Pt reports his nose has been bleeding "just a little bit all day" but tonight when he put his c-pap machine on it started to bleed a lot.  Bleeding in controlled in triage.

## 2018-02-01 NOTE — ED Provider Notes (Signed)
[facility] EMERGENCY DEPARTMENT Provider Note   CSN: [id number] Arrival date & time: 02/01/18  2156     History   Chief Complaint Chief Complaint  Patient presents with  . Epistaxis    HPI Melvin Hudson is a 62 y.o. male.   62 year old male with a history of hypertension, obesity presents to the emergency department for evaluation of left-sided epistaxis.  He states that he has had issues with left-sided epistaxis in the past.  Is usually very mild and resolves spontaneously without intervention.  Had slightly more of a nosebleed yesterday than normal.  Was able to stop this with a tissue at home.  Did not have any issues with repeat bleeding until he started to use his CPAP tonight.  Received a new machine 4 months ago and believes that the pressure was initially too high.  Denies use of chronic anticoagulation.  No associated fevers or facial trauma.  Bleeding stopped by the time the patient reached the ED.     Past Medical History:  Diagnosis Date  . Angioedema 09/17/11   tongue and lips  . Arthritis    "both of my legs and feet" (01/05/2014)  . GI bleed   . Hx of gout   . Hypertension   . Obesity   . OSA on CPAP   . S/P radiation therapy 07/26/2013-09/15/2013   Right tonsil/bilateral neck/ 7000 cGy  . Squamous cell carcinoma of right tonsil ([place]) 06/14/13    Patient Active Problem List   Diagnosis Date Noted  . Obesity, Class III, BMI 40-49.9 (morbid obesity) ([place])   . BRBPR (bright red blood per rectum) 08/30/2016  . Chronic pain syndrome   . History of GI diverticular bleed 04/18/2014  . Essential hypertension   . Lower GI bleed   . Hematochezia 04/10/2014  . Symptomatic anemia 04/08/2014  . 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 Mouth pain 07/18/2013  . Weight loss 07/18/2013  . 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[place]) 06/29/2013  . OSA (obstructive sleep apnea) 11/19/2011  . Angio-edema 09/17/2011  . HTN (hypertension) 09/17/2011  .  Gout 09/17/2011    Past Surgical History:  Procedure Laterality Date  . COLONOSCOPY Left 04/11/2014   Procedure: COLONOSCOPY;  Surgeon: Arta Silence, MD;  Location: [facility] ENDOSCOPY;  Service: Endoscopy;  Laterality: Left;  . COLONOSCOPY N/A 04/20/2014   Procedure: COLONOSCOPY;  Surgeon: Lear Ng, MD;  Location: [facility] ENDOSCOPY;  Service: Endoscopy;  Laterality: N/A;  . COLONOSCOPY W/ BIOPSIES AND POLYPECTOMY     benign  . DIRECT LARYNGOSCOPY  01/05/2014  . DIRECT LARYNGOSCOPY N/A 01/05/2014   Procedure: DIRECT LARYNGOSCOPY AND BIOPSY;  Surgeon: [name], MD;  Location: [place];  Service: ENT;  Laterality: N/A;  . KNEE ARTHROSCOPY Right ~ 1994  . LEFT AND RIGHT HEART CATHETERIZATION WITH CORONARY/GRAFT ANGIOGRAM N/A 12/31/2010   Procedure: LEFT AND RIGHT HEART CATHETERIZATION WITH Beatrix Fetters;  Surgeon: Laverda Page, MD;  Location: [facility] CATH LAB;  Service: Cardiovascular;  Laterality: N/A;  . MULTIPLE EXTRACTIONS WITH ALVEOLOPLASTY N/A 07/08/2013   Procedure: Extraction of tooth #'s 22, 27 with alveoloplasty and bilateal mandibular facial exostoses reductions;  Surgeon: Lenn Cal, DDS;  Location: [facility] ORS;  Service: Oral Surgery;  Laterality: N/A;  . MULTIPLE TOOTH EXTRACTIONS  ~ 2008        Home Medications    Prior to Admission medications   Medication Sig Start Date End Date Taking? Authorizing Provider  allopurinol (ZYLOPRIM) 300 MG tablet Take 300  mg by mouth daily as needed.     [provider]  aspirin 81 MG tablet Take 81 mg by mouth every other day.    [provider]  calcium citrate-vitamin D 500-500 MG-UNIT chewable tablet Chew 1 tablet by mouth 2 (two) times daily.    [provider]  carvedilol (COREG) 25 MG tablet Take 25 mg by mouth 2 (two) times daily with a meal.    [provider]  cetirizine (ZYRTEC) 10 MG tablet Take 10 mg by mouth daily as needed. 06/06/16   [provider]  colchicine 0.6 MG  tablet Take 0.6 mg by mouth daily as needed.    [provider]  diphenhydrAMINE (BENADRYL) 25 MG tablet Take 1 tablet (25 mg total) by mouth every 6 (six) hours. Patient taking differently: Take 25 mg by mouth every 6 (six) hours as needed.  10/29/13   Dorie Rank, MD  eplerenone (INSPRA) 50 MG tablet Take 50 mg by mouth daily. 08/29/16   [provider]  furosemide (LASIX) 40 MG tablet Take 40 mg by mouth 2 (two) times daily. 08/25/16   [provider]  hydrocortisone-pramoxine (PROCTOFOAM-HC) rectal foam Place 1 applicator rectally 2 (two) times daily. 09/03/16   Annita Brod, MD  NIFEdipine (ADALAT CC) 90 MG 24 hr tablet Take 90 mg by mouth daily.    [provider]  omeprazole (PRILOSEC) 20 MG capsule Take 20 mg by mouth as needed. 06/04/16   [provider]  oxyCODONE-acetaminophen (PERCOCET) 10-325 MG per tablet Take 1 tablet by mouth every 6 (six) hours as needed for pain. Patient taking differently: Take 1 tablet by mouth 4 (four) times daily.  11/11/13   Eppie Gibson, MD  polyethylene glycol [facility] / Floria Raveling) packet Take 17 g by mouth daily. 04/20/14   McKeag, Marylynn Pearson, MD  sildenafil (REVATIO) 20 MG tablet Take 20 mg by mouth as needed.    [provider]    Family History Family History  Problem Relation Age of Onset  . Cancer Sister        breast ca    Social History Social History   Tobacco Use  . Smoking status: Former Smoker    Years: 42.00    Types: Cigarettes, Cigars    Last attempt to quit: 06/17/2013    Years since quitting: 4.6  . Smokeless tobacco: Never Used  . Tobacco comment: "Quit smoking cigarettes in ~  2007"  Substance Use Topics  . 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 Drug use: No     Allergies   Advil [ibuprofen] and Azilsartan   Review of Systems Review of Systems Ten systems reviewed and are negative for acute change, except as noted in the HPI.    Physical  Exam Updated Vital Signs BP (!) 139/91 (BP Location: Right Arm)   Pulse 73   Temp 97.9 F (36.6 C) (Oral)   Resp 18   Ht 6\' 2"  (1.88 m)   Wt (!) 145.6 kg   SpO2 98%   BMI 41.21 kg/m   Physical Exam Vitals signs and nursing note reviewed.  Constitutional:      General: He is not in acute distress.    Appearance: He is well-developed. He is not diaphoretic.     Comments: Nontoxic appearing and pleasant.  HENT:     Head: Normocephalic and atraumatic.     Nose:     Comments: Bilateral nares patent.  No  septal deviation or hematoma.  Macerated capillaries appreciated to the medial aspect of the left nare.  No active bleeding.  No blood clots in the nose or oropharynx. Eyes:     General: No scleral icterus.    Conjunctiva/sclera: Conjunctivae normal.  Neck:     Musculoskeletal: Normal range of motion.  Pulmonary:     Effort: Pulmonary effort is normal. No respiratory distress.     Comments: Respirations even and unlabored Musculoskeletal: Normal range of motion.  Skin:    General: Skin is warm and dry.     Coloration: Skin is not pale.     Findings: No erythema or rash.  Neurological:     Mental Status: He is alert and oriented to person, place, and time.     Comments: Ambulatory with steady gait.  Psychiatric:        Behavior: Behavior normal.      ED Treatments / Results  Labs (all labs ordered are listed, but only abnormal results are displayed) Labs Reviewed - No data to display  EKG None  Radiology No results found.  Procedures Procedures (including critical care time)  Medications Ordered in ED Medications  oxymetazoline (AFRIN) 0.05 % nasal spray 2 spray (has no administration in time range)     Initial Impression / Assessment and Plan / ED Course  I have reviewed the triage vital signs and the nursing notes.  Pertinent labs & imaging results that were available during my care of the patient were reviewed by me and considered in my medical decision  making (see chart for details).     Patient presenting for left-sided epistaxis likely aggravated by use of his CPAP machine.  Had a mild nosebleed in the afternoon yesterday.  CPAP pressure likely dislodged any clot that had formed.  His bleeding stopped prior to ED arrival.  His nares are patent with no septal deviation or hematoma.  He was treated in the ED with Afrin and discharged with this for home use over the next 3 days.  Return precautions discussed and provided. Patient discharged in stable condition with no unaddressed concerns.   Final Clinical Impressions(s) / ED Diagnoses   Final diagnoses:  Left-sided epistaxis    ED Discharge Orders    None       Antonietta Breach, PA-C 02/01/18 2358    Mesner, Corene Cornea, MD 02/02/18 [phone]

## 2018-02-02 DIAGNOSIS — R040 Epistaxis: Secondary | ICD-10-CM | POA: Diagnosis not present

## 2018-04-13 NOTE — Progress Notes (Signed)
Melvin Hudson presents for follow up of radiation completed 09/15/2013 to his right tonsil and bilateral neck.   Pain issues, if any: He reports chronic pain to his legs. He takes oxycodone as needed.  Using a feeding tube?: No Weight changes, if any:  Wt Readings from Last 3 Encounters:  04/16/18 (!) 341 lb 6.4 oz (154.9 kg)  02/01/18 (!) 321 lb (145.6 kg)  04/29/17 (!) 336 lb 6.4 oz (152.6 kg)   Swallowing issues, if any: He denies.  Smoking or chewing tobacco? No Using fluoride trays daily? No, edentulous Last ENT visit was on: Dr. Constance Holster 01/19/2018 Other notable issues, if any:   BP (!) 147/94   Pulse 76   Temp 98 F (36.7 C) (Oral)   Wt (!) 341 lb 6.4 oz (154.9 kg)   SpO2 94% Comment: room air  BMI 43.83 kg/m

## 2018-04-15 NOTE — Progress Notes (Signed)
Radiation Oncology         [phone] ________________________________  Name: Melvin Hudson MRN: [id number]  Date: 04/16/2018  DOB: 1955/10/26  Follow-Up Visit Note  Outpatient  CC: Melvin Landsman, MD  Melvin Gala, MD    ICD-10-CM   1. Weight gain R63.5 TSH  2. 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HCC) C09.0     Diagnosis and Prior Radiotherapy:   [id number] Stage IVA right tonsil squamous cell carcinoma  07/26/2013 - 09/15/2013: Right tonsil and bilateral neck treated to 70 Gy in 35 fractions   CHIEF COMPLAINT: check-up for tonsil cancer  Narrative: The patient returns today for routine follow-up of radiation completed 09/15/2013 to his right tonsil and bilateral neck. He has been doing well overall. He notes that he is getting back to eating as he used to recently. He reports that he has intermittent "food kicks" where he likes certain food items, now his current favorite is chicken. He doesn't eat fried foods and he is unsure of his recent weight gain. He walks occassionally, however, he doesn't belong to a gym.  Pain issues, if any: he denies pain at this time.   Using a feeding tube?: No  Weight changes, if any:  Wt Readings from Last 3 Encounters:  04/16/18 (!) 341 lb 6.4 oz (154.9 kg)  02/01/18 (!) 321 lb (145.6 kg)  04/29/17 (!) 336 lb 6.4 oz (152.6 kg)   Swallowing issues, if any: He is now able to eat rice and it didn't bother him. He denies any other swallowing issues.    Smoking or chewing tobacco? No  Using fluoride trays daily? No- edentulous  Last ENT visit was on: 01/19/2018 with Dr. Constance Hudson  Other notable issues, if any: No other issues noted at this time.    ALLERGIES:  is allergic to advil [ibuprofen] and azilsartan.  Meds: Current Outpatient Medications  Medication Sig Dispense Refill  . allopurinol (ZYLOPRIM) 300 MG tablet Take 300 mg by mouth daily as needed.     [name] aspirin 81 MG tablet Take 81 mg by mouth every other day.    . calcium  citrate-vitamin D 500-500 MG-UNIT chewable tablet Chew 1 tablet by mouth 2 (two) times daily.    . carvedilol (COREG) 25 MG tablet Take 25 mg by mouth 2 (two) times daily with a meal.    . cetirizine (ZYRTEC) 10 MG tablet Take 10 mg by mouth daily as needed.  2  . colchicine 0.6 MG tablet Take 0.6 mg by mouth daily as needed.    . diphenhydrAMINE (BENADRYL) 25 MG tablet Take 1 tablet (25 mg total) by mouth every 6 (six) hours. (Patient taking differently: Take 25 mg by mouth every 6 (six) hours as needed. ) 20 tablet 0  . eplerenone (INSPRA) 50 MG tablet Take 50 mg by mouth daily.  2  . furosemide (LASIX) 40 MG tablet Take 40 mg by mouth 2 (two) times daily.  2  . hydrocortisone-pramoxine (PROCTOFOAM-HC) rectal foam Place 1 applicator rectally 2 (two) times daily. 10 g 0  . NIFEdipine (ADALAT CC) 90 MG 24 hr tablet Take 90 mg by mouth daily.    [name] omeprazole (PRILOSEC) 20 MG capsule Take 20 mg by mouth as needed.  4  . oxyCODONE-acetaminophen (PERCOCET) 10-325 MG per tablet Take 1 tablet by mouth every 6 (six) hours as needed for pain. (Patient taking differently: Take 1 tablet by mouth 4 (four) times daily. ) 120 tablet 0  . gabapentin (NEURONTIN) 300 MG capsule TK  1 C PO TID  2  . hydrALAZINE (APRESOLINE) 100 MG tablet TK 1 T PO TID    . metFORMIN (GLUCOPHAGE) 500 MG tablet TK 1 T PO BID    . polyethylene glycol (MIRALAX / GLYCOLAX) packet Take 17 g by mouth daily. (Patient not taking: Reported on 04/16/2018) 14 each 0  . sildenafil (REVATIO) 20 MG tablet Take 20 mg by mouth as needed.     No current facility-administered medications for this encounter.     Physical Findings:   weight is 341 lb 6.4 oz (154.9 kg) (abnormal). His oral temperature is 98 F (36.7 C). His blood pressure is 147/94 (abnormal) and his pulse is 76. His oxygen saturation is 94%.   General: Alert and oriented, in no acute distress. HEENT: Head is normocephalic. Edentulous. Dentures removed for oral exam. Oropharynx and  oral cavity are clear.   Neck: Neck is supple, no palpable cervical or supraclavicular lymphadenopathy. No masses appreciated. Heart: Regular in rate and rhythm with no murmurs, rubs, or gallops. Chest: Clear to auscultation bilaterally, with no rhonchi, wheezes, or rales. Abdomen: Soft, nontender, nondistended, with no rigidity or guarding. Normal bowel sounds. Lymphatics: see Neck Exam Skin: No concerning lesions. Skin over his neck has healed well.    Lab Findings: Lab Results  Component Value Date   WBC 6.7 08/31/2016   HGB 10.4 (L) 09/03/2016   HCT 30.3 (L) 09/03/2016   MCV 83.0 08/31/2016   PLT 216 08/31/2016   Lab Results  Component Value Date   TSH 1.160 04/16/2018   Radiographic Findings: No results found.    Impression/Plan:  5 years out from completion of therapy. I consider him cured. We will discharge him from our clinic. He knows that he can call the office if he has any questions in the interim.   TSH: WNL. I instructed the patient that he would benefit from a yearly TSH level for screening of hypothyroidism.  He is at increased risk of this due to prior radiotherapy however it is reassuring that he is not developed it over the past 5 years.  He knows to ask his PCP to continue this next year when he is due for it  Obesity and Diabetes: Discussed with the patient regarding gym memberships at the [facility] and [employer] program  Follow Up: PRN follow up in Radiation Oncology and maintain follow up with Dr. Constance Hudson as scheduled.    _____________________________________   Eppie Gibson, MD  This document serves as a record of services personally performed by Eppie Gibson, MD. It was created on her behalf by [facility], a trained medical scribe. The creation of this record is based on the scribe's personal observations and the provider's statements to them. This document has been checked and approved by the attending provider.

## 2018-04-16 ENCOUNTER — Ambulatory Visit
Admission: RE | Admit: 2018-04-16 | Discharge: 2018-04-16 | Disposition: A | Discharge status: Home / Self Care | Payer: Medicare Other | Source: Ambulatory Visit | Attending: Radiation Oncology | Admitting: Radiation Oncology

## 2018-04-16 ENCOUNTER — Other Ambulatory Visit: Payer: Self-pay

## 2018-04-16 ENCOUNTER — Encounter: Payer: Self-pay | Admitting: Radiation Oncology

## 2018-04-16 VITALS — BP 147/94 | HR 76 | Temp 98.0°F | Wt 341.4 lb

## 2018-04-16 DIAGNOSIS — Z79899 Other long term (current) drug therapy: Secondary | ICD-10-CM | POA: Insufficient documentation

## 2018-04-16 DIAGNOSIS — Z7982 Long term (current) use of aspirin: Secondary | ICD-10-CM | POA: Insufficient documentation

## 2018-04-16 DIAGNOSIS — Z7984 Long term (current) use of oral hypoglycemic drugs: Secondary | ICD-10-CM | POA: Insufficient documentation

## 2018-04-16 DIAGNOSIS — Z923 Personal history of irradiation: Secondary | ICD-10-CM | POA: Diagnosis not present

## 2018-04-16 DIAGNOSIS — E669 Obesity, unspecified: Secondary | ICD-10-CM | POA: Diagnosis not present

## 2018-04-16 DIAGNOSIS — C090 Malignant neoplasm of tonsillar fossa: Secondary | ICD-10-CM

## 2018-04-16 DIAGNOSIS — E119 Type 2 diabetes mellitus without complications: Secondary | ICD-10-CM | POA: Insufficient documentation

## 2018-04-16 DIAGNOSIS — Z85818 Personal history of malignant neoplasm of other sites of lip, oral cavity, and pharynx: Secondary | ICD-10-CM | POA: Insufficient documentation

## 2018-04-16 DIAGNOSIS — R635 Abnormal weight gain: Secondary | ICD-10-CM

## 2018-04-16 DIAGNOSIS — Z85819 Personal history of malignant neoplasm of unspecified site of lip, oral cavity, and pharynx: Secondary | ICD-10-CM | POA: Insufficient documentation

## 2018-04-16 LAB — TSH: TSH: 1.16 u[IU]/mL (ref 0.320–4.118)

## 2018-04-20 ENCOUNTER — Encounter: Payer: Self-pay | Admitting: Cardiology

## 2018-04-20 ENCOUNTER — Other Ambulatory Visit: Payer: Self-pay | Admitting: Cardiology

## 2018-04-20 ENCOUNTER — Ambulatory Visit (INDEPENDENT_AMBULATORY_CARE_PROVIDER_SITE_OTHER): Payer: Medicare Other | Admitting: Cardiology

## 2018-04-20 VITALS — BP 139/93 | HR 73 | Ht 74.0 in | Wt 338.0 lb

## 2018-04-20 DIAGNOSIS — I10 Essential (primary) hypertension: Secondary | ICD-10-CM

## 2018-04-20 DIAGNOSIS — R600 Localized edema: Secondary | ICD-10-CM

## 2018-04-20 DIAGNOSIS — R011 Cardiac murmur, unspecified: Secondary | ICD-10-CM

## 2018-04-20 DIAGNOSIS — N529 Male erectile dysfunction, unspecified: Secondary | ICD-10-CM

## 2018-04-20 DIAGNOSIS — E7800 Pure hypercholesterolemia, unspecified: Secondary | ICD-10-CM

## 2018-04-20 HISTORY — DX: Male erectile dysfunction, unspecified: N52.9

## 2018-04-20 HISTORY — DX: Pure hypercholesterolemia, unspecified: E78.00

## 2018-04-20 HISTORY — DX: Cardiac murmur, unspecified: R01.1

## 2018-04-20 MED ORDER — CLONIDINE HCL 0.1 MG PO TABS: 0.1000 mg | ORAL_TABLET | Freq: Two times a day (BID) | ORAL | 1 refills | Status: DC

## 2018-04-20 MED ORDER — HYDRALAZINE HCL 50 MG PO TABS: 50.0000 mg | ORAL_TABLET | Freq: Three times a day (TID) | ORAL | 1 refills | Status: DC

## 2018-04-20 NOTE — Progress Notes (Signed)
Subjective:   Melvin Hudson, male    DOB: [birth year], 63 y.o.   MRN: [id number]  [name], MD:  Chief Complaint  Patient presents with  . Hypertension    4 week F/U     HPI: Melvin Hudson  is a 63 y.o. male  with  history of long-term hypertension, mild chronic dyspnea, no significant coronary artery disease by coronary angiography in 2012, has slow flow in the coronary arteries suggestive of diastolic dysfunction. He has history of tobacco use disorder which is resolved and quit since throat cancer diagnosis in 2015. He has severe sleep apnea and is presently on CPAP and is compliant.  Patient presents for follow up for hypertension. He was last seen 4 weeks ago and due to continued hypertension, hydralazine was increased to 100mg  daily. Since doing this, BP has improved; however, has noticed worsening leg swelling and fluid retention.   He has been making changes to his diet and had previously lost a few pounds; however, reports that his weight has again increased with him still following his diet.  Has not exercised the last few days. He denies any chest pain, shortness of breath,palpitations, or symptoms of claudication.  Past Medical History:  Diagnosis Date  . Angioedema 09/17/11   tongue and lips  . Arthritis    "both of my legs and feet" (01/05/2014)  . Erectile dysfunction 04/20/2018  . GI bleed   . Heart murmur 04/20/2018  . Hx of gout   . Hypertension   . Obesity   . OSA on CPAP   . Pure hypercholesterolemia 04/20/2018  . S/P radiation therapy 07/26/2013-09/15/2013   Right tonsil/bilateral neck/ 7000 cGy  . 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 COLONOSCOPY Left 04/11/2014   Procedure: COLONOSCOPY;  Surgeon: Arta Silence, MD;  Location: [facility] ENDOSCOPY;  Service: Endoscopy;  Laterality: Left;  . COLONOSCOPY N/A 04/20/2014   Procedure: COLONOSCOPY;  Surgeon: Lear Ng, MD;  Location:  [facility] ENDOSCOPY;  Service: Endoscopy;  Laterality: N/A;  . COLONOSCOPY W/ BIOPSIES AND POLYPECTOMY     benign  . DIRECT LARYNGOSCOPY  01/05/2014  . DIRECT LARYNGOSCOPY N/A 01/05/2014   Procedure: DIRECT LARYNGOSCOPY AND BIOPSY;  Surgeon: [name], MD;  Location: [place];  Service: ENT;  Laterality: N/A;  . KNEE ARTHROSCOPY Right ~ 1994  . LEFT AND RIGHT HEART CATHETERIZATION WITH CORONARY/GRAFT ANGIOGRAM N/A 12/31/2010   Procedure: LEFT AND RIGHT HEART CATHETERIZATION WITH Beatrix Fetters;  Surgeon: Laverda Page, MD;  Location: [facility] CATH LAB;  Service: Cardiovascular;  Laterality: N/A;  . MULTIPLE EXTRACTIONS WITH ALVEOLOPLASTY N/A 07/08/2013   Procedure: Extraction of tooth #'s 22, 27 with alveoloplasty and bilateal mandibular facial exostoses reductions;  Surgeon: Lenn Cal, DDS;  Location: [facility];  Service: Oral Surgery;  Laterality: N/A;  . MULTIPLE TOOTH EXTRACTIONS  ~ 2008    Family History  Problem Relation Age of Onset  . Cancer Mother   . Stroke Mother   . Hypertension Mother   . Diabetes Father   . Aneurysm Father   . Cancer Sister        breast ca    Social History   Socioeconomic History  . Marital status: Legally Separated    Spouse name: Not on file  . Number of children: Not on file  . Years of education: Not on file  . Highest education level: Not on file  Occupational History  . Not on file  Social Needs  . Financial resource strain: Not on file  . Food insecurity:    Worry: Not on file    Inability: Not on file  . Transportation needs:    Medical: No    Non-medical: No  Tobacco Use  . Smoking status: Former Smoker    Packs/day: 2.00    Years: 42.00    Pack years: 84.00    Types: Cigarettes, Cigars    Last attempt to quit: 06/17/2013    Years since quitting: 4.8  . Smokeless tobacco: Never Used  . Tobacco comment: "Quit smoking cigarettes in ~  2007"  Substance and Sexual Activity  . Alcohol use: Never    Frequency: Never  . Drug  use: No  . Sexual activity: Not Currently  Lifestyle  . Physical activity:    Days per week: Not on file    Minutes per session: Not on file  . Stress: Not on file  Relationships  . Social connections:    Talks on phone: Not on file    Gets together: Not on file    Attends religious service: Not on file    Active member of club or organization: Not on file    Attends meetings of clubs or organizations: Not on file    Relationship status: Not on file  . Intimate partner violence:    Fear of current or ex partner: No    Emotionally abused: No    Physically abused: No    Forced sexual activity: No  Other Topics Concern  . Not on file  Social History Narrative  . Not on file    Current Meds  Medication Sig  . allopurinol (ZYLOPRIM) 300 MG tablet Take 300 mg by mouth daily as needed.   [name] aspirin 81 MG tablet Take 81 mg by mouth every other day.  . calcium citrate-vitamin D 500-500 MG-UNIT chewable tablet Chew 1 tablet by mouth 2 (two) times daily.  . carvedilol (COREG) 25 MG tablet Take 25 mg by mouth 2 (two) times daily with a meal.  . cetirizine (ZYRTEC) 10 MG tablet Take 10 mg by mouth daily as needed.  . colchicine 0.6 MG tablet Take 0.6 mg by mouth daily as needed.  . diphenhydrAMINE (BENADRYL) 25 MG tablet Take 1 tablet (25 mg total) by mouth every 6 (six) hours. (Patient taking differently: Take 25 mg by mouth every 6 (six) hours as needed. )  . eplerenone (INSPRA) 50 MG tablet Take 50 mg by mouth daily.  . furosemide (LASIX) 40 MG tablet Take 40 mg by mouth 2 (two) times daily.  [name] gabapentin (NEURONTIN) 300 MG capsule TK 1 C PO TID  . hydrALAZINE (APRESOLINE) 100 MG tablet TK 1 T PO TID  . hydrocortisone-pramoxine (PROCTOFOAM-HC) rectal foam Place 1 applicator rectally 2 (two) times daily.  . metFORMIN (GLUCOPHAGE) 500 MG tablet TK 1 T PO BID  . NIFEdipine (ADALAT CC) 90 MG 24 hr tablet Take 90 mg by mouth daily.  [name] omeprazole (PRILOSEC) 20 MG capsule Take 20 mg by mouth  as needed.  [name] oxyCODONE-acetaminophen (PERCOCET) 10-325 MG per tablet Take 1 tablet by mouth every 6 (six) hours as needed for pain. (Patient taking differently: Take 1 tablet by mouth 4 (four) times daily. )  . polyethylene glycol (MIRALAX / GLYCOLAX) packet Take 17 g by mouth daily. (Patient taking differently: Take 17 g by mouth as needed. )  . sildenafil (REVATIO) 20 MG tablet  Take 20 mg by mouth as needed.     Review of Systems  Constitution: Positive for weight gain. Negative for decreased appetite, malaise/fatigue and weight loss.  Eyes: Negative for visual disturbance.  Cardiovascular: Positive for leg swelling. Negative for chest pain, claudication, dyspnea on exertion, orthopnea, palpitations and syncope.  Respiratory: Negative for hemoptysis and wheezing.   Endocrine: Negative for cold intolerance and heat intolerance.  Hematologic/Lymphatic: Does not bruise/bleed easily.  Skin: Negative for nail changes.  Musculoskeletal: Negative for muscle weakness and myalgias.  Gastrointestinal: Negative for abdominal pain, change in bowel habit, nausea and vomiting.  Neurological: Negative for difficulty with concentration, dizziness, focal weakness and headaches.  Psychiatric/Behavioral: Negative for altered mental status and suicidal ideas.  All other systems reviewed and are negative.      Objective:     Blood pressure (!) 139/93, pulse 73, height 6\' 2"  (1.88 m), weight (!) 338 lb (153.3 kg), SpO2 95 %.  Cardiovascular Studies  EKG 01/28/2018: Normal sinus rhythm at the rate of 69 bpm, left axis deviation, left anterior fascicular block. Right bundle branch block. PRWP, cannot exclude anterior infarct old, Nonspecific ST-T abnormality, cannot exclude inferior ischemia.   Echocardiogram [10/04/2014]: 1. Left ventricle cavity is normal in size. Moderate concentric hypertrophy of the left ventricle. Normal global wall motion. Doppler evidence of grade I (impaired) diastolic  dysfunction with elevated LV filling pressure. Calculated EF 57%. 2. Left atrial cavity is normal in size. 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 3. Trileaflet aortic valve with no regurgitation noted. Mild aortic valve leaflet thickening.  Coronary Angiogram [12/31/2010]: Heart cath 12/31/10: Noral LVEF. Slow flow in coronary arteries without stenosis. Mild pulmonary HTN.    Physical Exam  Constitutional: He is oriented to person, place, and time. Vital signs are normal. He appears well-developed and well-nourished.  Morbidly obese  HENT:  Head: Normocephalic and atraumatic.  Neck: Trachea normal and normal range of motion. No thyromegaly present.  Cardiovascular: Normal rate, regular rhythm and intact distal pulses.  Murmur heard.  Early systolic murmur is present with a grade of 1/6 at the upper right sternal border. 2+ pitting edema bilateral lower extremities Pulmonary/Chest: Effort normal and breath sounds normal. No accessory muscle usage. No respiratory distress.  Abdominal: Soft. Bowel sounds are normal.  Musculoskeletal: Normal range of motion.  Neurological: He is alert and oriented to person, place, and time.  Skin: Skin is warm and dry.  Vitals reviewed.          Assessment & Recommendations:   1. Essential hypertension Blood pressure improved with increased dose of hydralazine; however, developed leg edema with this. Will decrease his dose back to 50mg  TID. He is not on ACE inhibitor or ARB in view of previous angioedema. His only other option would be clonidine. No history of bradycardia. I will start him on low dose and slowly increase as needed. Will start Clonidine 0.1 mg daily.   2. Bilateral leg edema Suspect side effect from hydralazine. He is on lasix 40 mg twice a day. Will continue to monitor.   3. Obesity, Class III, BMI 40-49.9 (morbid obesity) ([place]) Continues to be an issue; however, he is trying to make diet  changes and start regular exercise. Once blood pressure is controlled could consider Belviq or Contrave depending upon cost and coverage.    I will see him back in 3-4 weeks for follow up on hypertension and leg edema.    Jeri Lager, FNP-C [facility] Office: [phone] Fax: [phone]

## 2018-04-21 ENCOUNTER — Encounter: Payer: Self-pay | Admitting: Cardiology

## 2018-04-21 DIAGNOSIS — R600 Localized edema: Secondary | ICD-10-CM | POA: Insufficient documentation

## 2018-05-13 ENCOUNTER — Ambulatory Visit: Payer: Medicaid Other | Admitting: Cardiology

## 2018-05-17 ENCOUNTER — Other Ambulatory Visit: Payer: Self-pay

## 2018-05-17 ENCOUNTER — Encounter: Payer: Self-pay | Admitting: Cardiology

## 2018-05-17 ENCOUNTER — Ambulatory Visit (INDEPENDENT_AMBULATORY_CARE_PROVIDER_SITE_OTHER): Payer: Medicare Other | Admitting: Cardiology

## 2018-05-17 VITALS — BP 142/86 | HR 77 | Ht 74.0 in | Wt 342.0 lb

## 2018-05-17 DIAGNOSIS — I10 Essential (primary) hypertension: Secondary | ICD-10-CM | POA: Diagnosis not present

## 2018-05-17 DIAGNOSIS — R600 Localized edema: Secondary | ICD-10-CM

## 2018-05-17 MED ORDER — CLONIDINE HCL 0.1 MG PO TABS: ORAL_TABLET | ORAL | 1 refills | Status: DC

## 2018-05-17 MED ORDER — HYDRALAZINE HCL 25 MG PO TABS: 25.0000 mg | ORAL_TABLET | Freq: Three times a day (TID) | ORAL | 3 refills | Status: DC

## 2018-05-17 NOTE — Progress Notes (Signed)
Subjective:   Melvin Hudson, male    DOB: 07-13-1955, 63 y.o.   MRN: [id number]  [name], MD:  Chief Complaint  Patient presents with  . Hypertension  . Follow-up    HPI: Melvin Hudson  is a 63 y.o. male  with  history of long-term hypertension, mild chronic dyspnea, no significant coronary artery disease by coronary angiography in 2012, has slow flow in the coronary arteries suggestive of diastolic dysfunction. He has history of tobacco use disorder which is resolved and quit since throat cancer diagnosis in 2015. He has severe sleep apnea and is presently on CPAP and is compliant.  Patient presents for follow up for hypertension. He was last seen 3 weeks ago and complained of fluid retention that was felt to be related to increased dose of hydralazine. I had added Clonidine 0.1mg  that he is tolerating well. Since doing this, BP has improved; however, has continued to notice leg swelling and fluid retention.   He has been making changes to his diet and had previously lost a few pounds; however, reports that his weight has again increased with him still following his diet.   He denies any chest pain, shortness of breath,palpitations, or symptoms of claudication.  Past Medical History:  Diagnosis Date  . Angioedema 09/17/11   tongue and lips  . Arthritis    "both of my legs and feet" (01/05/2014)  . Erectile dysfunction 04/20/2018  . GI bleed   . Heart murmur 04/20/2018  . Hx of gout   . Hypertension   . Obesity   . OSA on CPAP   . Pure hypercholesterolemia 04/20/2018  . S/P radiation therapy 07/26/2013-09/15/2013   Right tonsil/bilateral neck/ 7000 cGy  . 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 COLONOSCOPY Left 04/11/2014   Procedure: COLONOSCOPY;  Surgeon: Arta Silence, MD;  Location: [facility] ENDOSCOPY;  Service: Endoscopy;  Laterality: Left;  . COLONOSCOPY N/A 04/20/2014   Procedure: COLONOSCOPY;   Surgeon: Lear Ng, MD;  Location: [facility] ENDOSCOPY;  Service: Endoscopy;  Laterality: N/A;  . COLONOSCOPY W/ BIOPSIES AND POLYPECTOMY     benign  . DIRECT LARYNGOSCOPY  01/05/2014  . DIRECT LARYNGOSCOPY N/A 01/05/2014   Procedure: DIRECT LARYNGOSCOPY AND BIOPSY;  Surgeon: [name], MD;  Location: [place];  Service: ENT;  Laterality: N/A;  . KNEE ARTHROSCOPY Right ~ 1994  . LEFT AND RIGHT HEART CATHETERIZATION WITH CORONARY/GRAFT ANGIOGRAM N/A 12/31/2010   Procedure: LEFT AND RIGHT HEART CATHETERIZATION WITH Beatrix Fetters;  Surgeon: Laverda Page, MD;  Location: [facility] CATH LAB;  Service: Cardiovascular;  Laterality: N/A;  . MULTIPLE EXTRACTIONS WITH ALVEOLOPLASTY N/A 07/08/2013   Procedure: Extraction of tooth #'s 22, 27 with alveoloplasty and bilateal mandibular facial exostoses reductions;  Surgeon: Lenn Cal, DDS;  Location: [facility];  Service: Oral Surgery;  Laterality: N/A;  . MULTIPLE TOOTH EXTRACTIONS  ~ 2008    Family History  Problem Relation Age of Onset  . Cancer Mother   . Stroke Mother   . Hypertension Mother   . Diabetes Father   . Aneurysm Father   . Cancer Sister        breast ca    Social History   Socioeconomic History  . Marital status: Single    Spouse name: Not on file  . Number of children: 0  . Years of education: Not on file  . Highest education level: Not  on file  Occupational History  . Not on file  Social Needs  . Financial resource strain: Not on file  . Food insecurity:    Worry: Not on file    Inability: Not on file  . Transportation needs:    Medical: No    Non-medical: No  Tobacco Use  . Smoking status: Former Smoker    Packs/day: 2.00    Years: 42.00    Pack years: 84.00    Types: Cigarettes, Cigars    Last attempt to quit: 06/17/2013    Years since quitting: 4.9  . Smokeless tobacco: Never Used  . Tobacco comment: "Quit smoking cigarettes in ~  2007"  Substance and Sexual Activity  . Alcohol use: Never     Frequency: Never  . Drug use: No  . Sexual activity: Not Currently  Lifestyle  . Physical activity:    Days per week: Not on file    Minutes per session: Not on file  . Stress: Not on file  Relationships  . Social connections:    Talks on phone: Not on file    Gets together: Not on file    Attends religious service: Not on file    Active member of club or organization: Not on file    Attends meetings of clubs or organizations: Not on file    Relationship status: Not on file  . Intimate partner violence:    Fear of current or ex partner: No    Emotionally abused: No    Physically abused: No    Forced sexual activity: No  Other Topics Concern  . Not on file  Social History Narrative  . Not on file    Current Meds  Medication Sig  . allopurinol (ZYLOPRIM) 300 MG tablet Take 300 mg by mouth daily as needed.   [name] aspirin 81 MG tablet Take 81 mg by mouth every other day.  . calcium citrate-vitamin D 500-500 MG-UNIT chewable tablet Chew 1 tablet by mouth 2 (two) times daily.  . carvedilol (COREG) 25 MG tablet Take 25 mg by mouth 2 (two) times daily with a meal.  . cetirizine (ZYRTEC) 10 MG tablet Take 10 mg by mouth daily as needed.  . cloNIDine (CATAPRES) 0.1 MG tablet TAKE 1 TABLET(0.1 MG) BY MOUTH TWICE DAILY (Patient taking differently: daily. )  . colchicine 0.6 MG tablet Take 0.6 mg by mouth daily as needed.  . diphenhydrAMINE (BENADRYL) 25 MG tablet Take 1 tablet (25 mg total) by mouth every 6 (six) hours. (Patient taking differently: Take 25 mg by mouth every 6 (six) hours as needed. )  . eplerenone (INSPRA) 50 MG tablet Take 50 mg by mouth daily.  . furosemide (LASIX) 40 MG tablet Take 40 mg by mouth 2 (two) times daily.  [name] gabapentin (NEURONTIN) 300 MG capsule TK 1 C PO TID  . hydrocortisone-pramoxine (PROCTOFOAM-HC) rectal foam Place 1 applicator rectally 2 (two) times daily.  . metFORMIN (GLUCOPHAGE) 500 MG tablet TK 1 T PO BID  . NIFEdipine (ADALAT CC) 90 MG 24 hr  tablet Take 90 mg by mouth daily.  [name] omeprazole (PRILOSEC) 20 MG capsule Take 20 mg by mouth daily.   [name] oxyCODONE-acetaminophen (PERCOCET) 10-325 MG per tablet Take 1 tablet by mouth every 6 (six) hours as needed for pain. (Patient taking differently: Take 1 tablet by mouth 4 (four) times daily. )  . polyethylene glycol (MIRALAX / GLYCOLAX) packet Take 17 g by mouth daily. (Patient taking differently: Take 17 g by  mouth as needed. )  . [DISCONTINUED] hydrALAZINE (APRESOLINE) 50 MG tablet TAKE 1 TABLET(50 MG) BY MOUTH THREE TIMES DAILY     Review of Systems  Constitution: Positive for weight gain. Negative for decreased appetite, malaise/fatigue and weight loss.  Eyes: Negative for visual disturbance.  Cardiovascular: Positive for leg swelling. Negative for chest pain, claudication, dyspnea on exertion, orthopnea, palpitations and syncope.  Respiratory: Negative for hemoptysis and wheezing.   Endocrine: Negative for cold intolerance and heat intolerance.  Hematologic/Lymphatic: Does not bruise/bleed easily.  Skin: Negative for nail changes.  Musculoskeletal: Negative for muscle weakness and myalgias.  Gastrointestinal: Negative for abdominal pain, change in bowel habit, nausea and vomiting.  Neurological: Negative for difficulty with concentration, dizziness, focal weakness and headaches.  Psychiatric/Behavioral: Negative for altered mental status and suicidal ideas.  All other systems reviewed and are negative.      Objective:     Blood pressure (!) 142/86, pulse 77, height 6\' 2"  (1.88 m), weight (!) 342 lb (155.1 kg), SpO2 97 %.  Cardiovascular Studies  EKG 01/28/2018: Normal sinus rhythm at the rate of 69 bpm, left axis deviation, left anterior fascicular block. Right bundle branch block. PRWP, cannot exclude anterior infarct old, Nonspecific ST-T abnormality, cannot exclude inferior ischemia.   Echocardiogram [10/04/2014]: 1. Left ventricle cavity is normal in size. Moderate  concentric hypertrophy of the left ventricle. Normal global wall motion. Doppler evidence of grade I (impaired) diastolic dysfunction with elevated LV filling pressure. Calculated EF 57%. 2. Left atrial cavity is normal in size. 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 3. Trileaflet aortic valve with no regurgitation noted. Mild aortic valve leaflet thickening.  Coronary Angiogram [12/31/2010]: Heart cath 12/31/10: Noral LVEF. Slow flow in coronary arteries without stenosis. Mild pulmonary HTN.    Physical Exam  Constitutional: He is oriented to person, place, and time. Vital signs are normal. He appears well-developed and well-nourished.  Morbidly obese  HENT:  Head: Normocephalic and atraumatic.  Neck: Trachea normal and normal range of motion. No thyromegaly present.  Cardiovascular: Normal rate, regular rhythm and intact distal pulses.  Murmur heard.  Early systolic murmur is present with a grade of 1/6 at the upper right sternal border. 2+ pitting edema bilateral lower extremities 2+ leg edema  Pulmonary/Chest: Effort normal and breath sounds normal. No accessory muscle usage. No respiratory distress.  Abdominal: Soft. Bowel sounds are normal.  Musculoskeletal: Normal range of motion.  Neurological: He is alert and oriented to person, place, and time.  Skin: Skin is warm and dry.  Vitals reviewed.          Assessment & Recommendations:   1. Essential hypertension Has improved. Running in the 120's/70's at home. Continue to monitor and will continue to make changes to his diet. I will increase his Clonidine to twice a day due to decreasing dose of hydralazine.   2. Bilateral leg edema Continues to have 2+ leg edema on exam. No worsening shortness of breath. I will further decrease his dose of hydralazine down to 25 mg daily. This is likely the culprit of his symptoms as it started with starting the medication.  3. Obesity, Class III,  BMI 40-49.9 (morbid obesity) ([place]) Continue to be an issue. Encouraged continued diet and lifestyle modifications.    Plan:  Will schedule for virtual office visit in 4 weeks for follow up on hypertension.   Jeri Lager, FNP-C [facility] [place] Office: [phone] Fax: [phone]

## 2018-06-14 ENCOUNTER — Other Ambulatory Visit: Payer: Self-pay

## 2018-06-14 ENCOUNTER — Encounter: Payer: Self-pay | Admitting: Cardiology

## 2018-06-14 ENCOUNTER — Ambulatory Visit (INDEPENDENT_AMBULATORY_CARE_PROVIDER_SITE_OTHER): Payer: Medicare Other | Admitting: Cardiology

## 2018-06-14 VITALS — BP 137/84 | Ht 74.0 in | Wt 330.0 lb

## 2018-06-14 DIAGNOSIS — I10 Essential (primary) hypertension: Secondary | ICD-10-CM | POA: Diagnosis not present

## 2018-06-14 DIAGNOSIS — R0602 Shortness of breath: Secondary | ICD-10-CM | POA: Diagnosis not present

## 2018-06-14 DIAGNOSIS — R600 Localized edema: Secondary | ICD-10-CM | POA: Diagnosis not present

## 2018-06-14 NOTE — Progress Notes (Signed)
Subjective:   Melvin Hudson, male    DOB: [birth year], 63 y.o.   MRN: [id number]  Melvin Landsman, MD:  Chief Complaint  Patient presents with   Hypertension    4 week f/u     This visit type was conducted due to national recommendations for restrictions regarding the COVID-19 Pandemic (e.g. social distancing).  This format is felt to be most appropriate for this patient at this time.  All issues noted in this document were discussed and addressed.  No physical exam was performed (except for noted visual exam findings with Telehealth visits).  The patient has consented to conduct a Telehealth visit and understands insurance will be billed.   I discussed the limitations of evaluation and management by telemedicine and the availability of in person appointments. The patient expressed understanding and agreed to proceed.  Virtual Visit via Video Note is as below  I connected with Melvin Hudson, on 06/14/18 at 1040 by telephone and verified that I am speaking with the correct person using two identifiers. Unable to perform video visit as patient had difficulty with application.    I have discussed with her regarding the safety during COVID Pandemic and steps and precautions including social distancing with the patient.    HPI: Melvin Hudson  is a 63 y.o. male  with  history of long-term hypertension, diabetes, chronic dyspnea, no significant coronary artery disease by coronary angiography in 2012, has slow flow in the coronary arteries suggestive of diastolic dysfunction. He has history of tobacco use disorder which is resolved and quit since throat cancer diagnosis in 2015. He has severe sleep apnea and is presently on CPAP and is compliant.  Patient presents for follow up for hypertension. He was last seen 4 weeks ago and complained of fluid retention that was felt to be related to increased dose of hydralazine.Hydralazine was decreased to 25mg  TID and Clonidine was changed to 0.1mg  BID.  Blood pressure has improved as well as fluid retention. Continues to have intermittent leg swelling, but improved from before. Continues to have intermittent shortness of breath, mostly noticed with activity that was originally felt to be related to weight gain, but has continued despite this.   He has been making changes to his diet, but has not been exercising as he has been isolated due to [place].  He denies any chest pain, palpitations, or symptoms of claudication.   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    Past Surgical History:  Procedure Laterality Date   COLONOSCOPY Left 04/11/2014   Procedure: COLONOSCOPY;  Surgeon: Arta Silence, MD;  Location: [facility] ENDOSCOPY;  Service: Endoscopy;  Laterality: Left;   COLONOSCOPY N/A 04/20/2014   Procedure: COLONOSCOPY;  Surgeon: Lear Ng, MD;  Location: [facility] ENDOSCOPY;  Service: Endoscopy;  Laterality: N/A;   COLONOSCOPY W/ BIOPSIES AND POLYPECTOMY     benign   DIRECT LARYNGOSCOPY  01/05/2014   DIRECT LARYNGOSCOPY N/A 01/05/2014   Procedure: DIRECT LARYNGOSCOPY AND BIOPSY;  Surgeon: [name],  MD;  Location: [place] OR;  Service: ENT;  Laterality: N/A;   KNEE ARTHROSCOPY Right ~ [place] CORONARY/GRAFT ANGIOGRAM N/A 12/31/2010   Procedure: LEFT AND RIGHT HEART CATHETERIZATION WITH Beatrix Fetters;  Surgeon: Laverda Page, MD;  Location: [facility] CATH LAB;  Service: Cardiovascular;  Laterality: N/A;   MULTIPLE EXTRACTIONS WITH ALVEOLOPLASTY N/A 07/08/2013   Procedure: Extraction of  tooth #'s 22, 27 with alveoloplasty and bilateal mandibular facial exostoses reductions;  Surgeon: Lenn Cal, DDS;  Location: [facility] ORS;  Service: Oral Surgery;  Laterality: N/A;   MULTIPLE TOOTH EXTRACTIONS  ~ 2008    Family History  Problem Relation Age of Onset   Cancer Mother    Stroke Mother    Hypertension Mother    Diabetes Father    Aneurysm Father    Cancer Sister        breast ca    Social History   Socioeconomic History   Marital status: Single    Spouse name: Not on file   Number of children: 0   Years of education: Not on file   Highest education level: Not on file  Occupational History   Not on file  Social Needs   Financial resource strain: Not on file   Food insecurity:    Worry: Not on file    Inability: Not on file   Transportation needs:    Medical: No    Non-medical: No  Tobacco Use   Smoking status: Former Smoker    Packs/day: 2.00    Years: 42.00    Pack years: 84.00    Types: Cigarettes, Cigars    Last attempt to quit: 06/17/2013    Years since quitting: 4.9   Smokeless tobacco: Never Used   Tobacco comment: "Quit smoking cigarettes in ~  2007"  Substance and Sexual Activity   Alcohol use: Never    Frequency: Never   Drug use: No   Sexual activity: Not Currently  Lifestyle   Physical activity:    Days per week: Not on file    Minutes per session: Not on file   Stress: Not on file  Relationships   Social connections:    Talks on phone: Not on file    Gets together: Not on file    Attends religious service: Not on file    Active member of club or organization: Not on file    Attends meetings of clubs or organizations: Not on file    Relationship status: Not on file   Intimate partner violence:    Fear of current or ex partner: No    Emotionally abused: No    Physically abused: No    Forced sexual activity: No  Other Topics Concern   Not on file  Social History Narrative   Not on file    Current  Meds  Medication Sig   allopurinol (ZYLOPRIM) 300 MG tablet Take 300 mg by mouth daily as needed.    aspirin 81 MG tablet Take 81 mg by mouth every other day.   calcium citrate-vitamin D 500-500 MG-UNIT chewable tablet Chew 1 tablet by mouth 2 (two) times daily.   carvedilol (COREG) 25 MG tablet Take 25 mg by mouth 2 (two) times daily with a meal.   cetirizine (ZYRTEC) 10 MG tablet Take 10 mg by mouth daily as needed.   cloNIDine (CATAPRES) 0.1 MG tablet TAKE 1 TABLET(0.1 MG) BY MOUTH TWICE DAILY (Patient taking differently: Take 0.1 mg  by mouth 2 (two) times daily. TAKE 1 TABLET(0.1 MG) BY MOUTH TWICE DAILY)   colchicine 0.6 MG tablet Take 0.6 mg by mouth daily as needed.   diphenhydrAMINE (BENADRYL) 25 MG tablet Take 1 tablet (25 mg total) by mouth every 6 (six) hours. (Patient taking differently: Take 25 mg by mouth every 6 (six) hours as needed. )   eplerenone (INSPRA) 50 MG tablet Take 50 mg by mouth daily.   furosemide (LASIX) 40 MG tablet Take 40 mg by mouth 2 (two) times daily.   gabapentin (NEURONTIN) 300 MG capsule Take 300 mg by mouth 3 (three) times daily.    hydrALAZINE (APRESOLINE) 25 MG tablet Take 1 tablet (25 mg total) by mouth 3 (three) times daily.   hydrocortisone-pramoxine (PROCTOFOAM-HC) rectal foam Place 1 applicator rectally 2 (two) times daily.   metFORMIN (GLUCOPHAGE) 500 MG tablet Take 500 mg by mouth 2 (two) times daily with a meal.    NIFEdipine (ADALAT CC) 90 MG 24 hr tablet Take 90 mg by mouth daily.   omeprazole (PRILOSEC) 20 MG capsule Take 20 mg by mouth 3 times/day as needed-between meals & bedtime.    oxyCODONE-acetaminophen (PERCOCET) 10-325 MG per tablet Take 1 tablet by mouth every 6 (six) hours as needed for pain. (Patient taking differently: Take 1 tablet by mouth 4 (four) times daily. )   polyethylene glycol (MIRALAX / GLYCOLAX) packet Take 17 g by mouth daily. (Patient taking differently: Take 17 g by mouth as needed. )   sildenafil  (REVATIO) 20 MG tablet Take 20 mg by mouth as needed.     Review of Systems  Constitution: Negative for decreased appetite, malaise/fatigue, weight gain and weight loss.  Eyes: Negative for visual disturbance.  Cardiovascular: Positive for dyspnea on exertion and leg swelling. Negative for chest pain, claudication, orthopnea, palpitations and syncope.  Respiratory: Negative for hemoptysis and wheezing.   Endocrine: Negative for cold intolerance and heat intolerance.  Hematologic/Lymphatic: Does not bruise/bleed easily.  Skin: Negative for nail changes.  Musculoskeletal: Positive for joint pain. Negative for muscle weakness and myalgias.  Gastrointestinal: Negative for abdominal pain, change in bowel habit, nausea and vomiting.  Neurological: Negative for difficulty with concentration, dizziness, focal weakness and headaches.  Psychiatric/Behavioral: Negative for altered mental status and suicidal ideas.  All other systems reviewed and are negative.      Objective:     Blood pressure 137/84, height 6\' 2"  (1.88 m), weight (!) 330 lb (149.7 kg).  Cardiovascular Studies  EKG 01/28/2018: Normal sinus rhythm at the rate of 69 bpm, left axis deviation, left anterior fascicular block. Right bundle branch block. PRWP, cannot exclude anterior infarct old, Nonspecific ST-T abnormality, cannot exclude inferior ischemia.   Echocardiogram [10/04/2014]: 1. Left ventricle cavity is normal in size. Moderate concentric hypertrophy of the left ventricle. Normal global wall motion. Doppler evidence of grade I (impaired) diastolic dysfunction with elevated LV filling pressure. Calculated EF 57%. 2. Left atrial cavity is normal in size. 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 3. Trileaflet aortic valve with no regurgitation noted. Mild aortic valve leaflet thickening.  Coronary Angiogram [12/31/2010]: Heart cath 12/31/10: Noral LVEF. Slow flow in coronary  arteries without stenosis. Mild pulmonary HTN.    *Physical exam not performed as this is a telephone visit*       Assessment & Recommendations:   1. Essential hypertension Blood pressure has continued to improve and running in the 120's at home with current medication regimen, will  continue the same. Tolerating medications well.  2. Bilateral leg edema Intermittent. Suspect related to his diet and activity. Continue the same.  3. Shortness of breath Previously reported and suspected was related to his 15 lb weight gain that he has now lost with decreasing his dose of hydralazine; however, continues to have this. Continue to feel related to his obesity; however, patient feels that this is new. In view of his risk factors and as he has not had cardiac testing in sometime, will schedule for stress testing for evaluation. Will order lexiscan as patient is unable to walk on the treadmill due to bilateral knee pain. He denies any chest pain. Will also perform echocardiogram to exclude any structural abnormalities. Will plan for this to be performed in a few weeks once safe from [place] situation, but if symptoms worsen, will schedule for sooner.  4. Obesity, Class III, BMI 40-49.9 (morbid obesity) ([place]) Continues to be an issue. Urged him to continue with diet modifications. He has not been exercising, which I have encouraged him to start riding his stationary bicycle at home during the day and to start walking some as tolerated as he does have bilateral knee pain from arthritis.    Plan:  Will see him in the office in 6 weeks after his stress test for follow up on shortness of breath and hypertension.   Jeri Lager, FNP-C [facility], [place] Office: [phone] Fax: [phone]

## 2018-06-28 ENCOUNTER — Other Ambulatory Visit: Payer: Self-pay

## 2018-06-28 ENCOUNTER — Ambulatory Visit (INDEPENDENT_AMBULATORY_CARE_PROVIDER_SITE_OTHER): Payer: Medicare Other

## 2018-06-28 DIAGNOSIS — R0602 Shortness of breath: Secondary | ICD-10-CM | POA: Diagnosis not present

## 2018-06-28 DIAGNOSIS — I10 Essential (primary) hypertension: Secondary | ICD-10-CM

## 2018-07-17 ENCOUNTER — Other Ambulatory Visit: Payer: Self-pay | Admitting: Cardiology

## 2018-07-19 ENCOUNTER — Other Ambulatory Visit: Payer: Self-pay | Admitting: Cardiology

## 2018-07-19 NOTE — Telephone Encounter (Signed)
Please fill

## 2018-07-28 ENCOUNTER — Ambulatory Visit (INDEPENDENT_AMBULATORY_CARE_PROVIDER_SITE_OTHER): Payer: Medicare Other

## 2018-07-28 ENCOUNTER — Other Ambulatory Visit: Payer: Self-pay

## 2018-07-28 DIAGNOSIS — R0602 Shortness of breath: Secondary | ICD-10-CM | POA: Diagnosis not present

## 2018-07-29 ENCOUNTER — Other Ambulatory Visit: Payer: Self-pay | Admitting: Cardiology

## 2018-07-29 MED ORDER — BLOOD PRESSURE MONITOR/L CUFF MISC: 1.0000 | Freq: Once | 0 refills | Status: AC

## 2018-07-29 NOTE — Progress Notes (Signed)
Patient requested new home BP cuff

## 2018-08-05 ENCOUNTER — Other Ambulatory Visit: Payer: Self-pay

## 2018-08-05 ENCOUNTER — Ambulatory Visit (INDEPENDENT_AMBULATORY_CARE_PROVIDER_SITE_OTHER): Payer: Medicare Other | Admitting: Cardiology

## 2018-08-05 ENCOUNTER — Encounter: Payer: Self-pay | Admitting: Cardiology

## 2018-08-05 VITALS — BP 139/86 | HR 88 | Temp 97.8°F | Ht 74.0 in | Wt 345.3 lb

## 2018-08-05 DIAGNOSIS — I5032 Chronic diastolic (congestive) heart failure: Secondary | ICD-10-CM

## 2018-08-05 DIAGNOSIS — R0602 Shortness of breath: Secondary | ICD-10-CM | POA: Diagnosis not present

## 2018-08-05 DIAGNOSIS — I10 Essential (primary) hypertension: Secondary | ICD-10-CM

## 2018-08-05 NOTE — Progress Notes (Signed)
Subjective:   Melvin Hudson, male    DOB: 02/23/1955, 63 y.o.   MRN: [id number]  [name], MD:  Chief Complaint  Patient presents with  . Hypertension  . Results    nuc, echo  . Follow-up    HPI: Melvin Hudson  is a 63 y.o. male  with  history of long-term hypertension, diabetes, chronic dyspnea, no significant coronary artery disease by coronary angiography in 2012, has slow flow in the coronary arteries suggestive of diastolic dysfunction. He has history of tobacco use disorder which is resolved and quit since throat cancer diagnosis in 2015. He has severe sleep apnea and is presently on CPAP and is compliant.  Patient presents for follow up for hypertension and to discuss stress and echo results. Blood pressure has improved. Continues to have intermittent leg swelling, but improved from before. He is now using support stockings He had previously lost 15 lbs; however, has now gained it back. He feels that this is water weight. Admits to drinking a lot of water each day.  He has been making changes to his diet, but has not been exercising as he has been isolated due to [place].  He denies any chest pain, palpitations, or symptoms of claudication. Dyspnea on exertion is stable.   Past Medical History:  Diagnosis Date  . Angioedema 09/17/11   tongue and lips  . Arthritis    "both of my legs and feet" (01/05/2014)  . Erectile dysfunction 04/20/2018  . GI bleed   . Heart murmur 04/20/2018  . Hx of gout   . Hypertension   . Obesity   . OSA on CPAP   . Pure hypercholesterolemia 04/20/2018  . S/P radiation therapy 07/26/2013-09/15/2013   Right tonsil/bilateral neck/ 7000 cGy  . 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 COLONOSCOPY Left 04/11/2014   Procedure: COLONOSCOPY;  Surgeon: Arta Silence, MD;  Location: [facility] ENDOSCOPY;  Service: Endoscopy;  Laterality: Left;  . COLONOSCOPY N/A 04/20/2014   Procedure:  COLONOSCOPY;  Surgeon: Lear Ng, MD;  Location: [facility] ENDOSCOPY;  Service: Endoscopy;  Laterality: N/A;  . COLONOSCOPY W/ BIOPSIES AND POLYPECTOMY     benign  . DIRECT LARYNGOSCOPY  01/05/2014  . DIRECT LARYNGOSCOPY N/A 01/05/2014   Procedure: DIRECT LARYNGOSCOPY AND BIOPSY;  Surgeon: [name], MD;  Location: [place];  Service: ENT;  Laterality: N/A;  . KNEE ARTHROSCOPY Right ~ 1994  . LEFT AND RIGHT HEART CATHETERIZATION WITH CORONARY/GRAFT ANGIOGRAM N/A 12/31/2010   Procedure: LEFT AND RIGHT HEART CATHETERIZATION WITH Beatrix Fetters;  Surgeon: Laverda Page, MD;  Location: [facility] CATH LAB;  Service: Cardiovascular;  Laterality: N/A;  . MULTIPLE EXTRACTIONS WITH ALVEOLOPLASTY N/A 07/08/2013   Procedure: Extraction of tooth #'s 22, 27 with alveoloplasty and bilateal mandibular facial exostoses reductions;  Surgeon: Lenn Cal, DDS;  Location: [facility] ORS;  Service: Oral Surgery;  Laterality: N/A;  . MULTIPLE TOOTH EXTRACTIONS  ~ 2008    Family History  Problem Relation Age of Onset  . Cancer Mother   . Stroke Mother   . Hypertension Mother   . Diabetes Father   . Aneurysm Father   . Cancer Sister        breast ca    Social History   Socioeconomic History  . Marital status: Single    Spouse name: Not on file  . Number of children: 0  . Years of education: Not  on file  . Highest education level: Not on file  Occupational History  . Not on file  Social Needs  . Financial resource strain: Not on file  . Food insecurity    Worry: Not on file    Inability: Not on file  . Transportation needs    Medical: No    Non-medical: No  Tobacco Use  . Smoking status: Former Smoker    Packs/day: 2.00    Years: 42.00    Pack years: 84.00    Types: Cigarettes, Cigars    Quit date: 06/17/2013    Years since quitting: 5.1  . Smokeless tobacco: Never Used  . Tobacco comment: "Quit smoking cigarettes in ~  2007"  Substance and Sexual Activity  . Alcohol use: Never     Frequency: Never  . Drug use: No  . Sexual activity: Not Currently  Lifestyle  . Physical activity    Days per week: Not on file    Minutes per session: Not on file  . Stress: Not on file  Relationships  . Social Herbalist on phone: Not on file    Gets together: Not on file    Attends religious service: Not on file    Active member of club or organization: Not on file    Attends meetings of clubs or organizations: Not on file    Relationship status: Not on file  . Intimate partner violence    Fear of current or ex partner: No    Emotionally abused: No    Physically abused: No    Forced sexual activity: No  Other Topics Concern  . Not on file  Social History Narrative  . Not on file    Current Meds  Medication Sig  . allopurinol (ZYLOPRIM) 300 MG tablet Take 300 mg by mouth daily as needed.   [name] aspirin 81 MG tablet Take 81 mg by mouth daily.   . calcium citrate-vitamin D 500-500 MG-UNIT chewable tablet Chew 1 tablet by mouth 2 (two) times daily.  . carvedilol (COREG) 25 MG tablet Take 25 mg by mouth 2 (two) times daily with a meal.  . cetirizine (ZYRTEC) 10 MG tablet Take 10 mg by mouth daily.   . cloNIDine (CATAPRES) 0.1 MG tablet TAKE 1 TABLET(0.1 MG) BY MOUTH TWICE DAILY (Patient taking differently: Take 0.1 mg by mouth 2 (two) times daily. TAKE 1 TABLET(0.1 MG) BY MOUTH TWICE DAILY)  . diphenhydrAMINE (BENADRYL) 25 MG tablet Take 1 tablet (25 mg total) by mouth every 6 (six) hours. (Patient taking differently: Take 25 mg by mouth every 6 (six) hours as needed. )  . eplerenone (INSPRA) 50 MG tablet Take 50 mg by mouth daily.  . furosemide (LASIX) 40 MG tablet Take 40 mg by mouth 2 (two) times daily.  [name] gabapentin (NEURONTIN) 300 MG capsule Take 300 mg by mouth 3 (three) times daily.   . hydrALAZINE (APRESOLINE) 25 MG tablet TAKE 1 TABLET BY MOUTH THREE TIMES DAILY  . hydrocortisone-pramoxine (PROCTOFOAM-HC) rectal foam Place 1 applicator rectally 2 (two) times  daily. (Patient taking differently: Place 1 applicator rectally as needed. )  . metFORMIN (GLUCOPHAGE) 500 MG tablet Take 500 mg by mouth 2 (two) times daily with a meal.   . NIFEdipine (ADALAT CC) 90 MG 24 hr tablet Take 90 mg by mouth daily.  [name] omeprazole (PRILOSEC) 20 MG capsule Take 20 mg by mouth daily.   [name] oxyCODONE-acetaminophen (PERCOCET) 10-325 MG per tablet Take 1 tablet by  mouth every 6 (six) hours as needed for pain. (Patient taking differently: Take 1 tablet by mouth 4 (four) times daily. )  . sildenafil (REVATIO) 20 MG tablet Take 20 mg by mouth as needed.     Review of Systems  Constitution: Negative for decreased appetite, malaise/fatigue, weight gain and weight loss.  Eyes: Negative for visual disturbance.  Cardiovascular: Positive for dyspnea on exertion and leg swelling. Negative for chest pain, claudication, orthopnea, palpitations and syncope.  Respiratory: Negative for hemoptysis and wheezing.   Endocrine: Negative for cold intolerance and heat intolerance.  Hematologic/Lymphatic: Does not bruise/bleed easily.  Skin: Negative for nail changes.  Musculoskeletal: Positive for joint pain. Negative for muscle weakness and myalgias.  Gastrointestinal: Negative for abdominal pain, change in bowel habit, nausea and vomiting.  Neurological: Negative for difficulty with concentration, dizziness, focal weakness and headaches.  Psychiatric/Behavioral: Negative for altered mental status and suicidal ideas.  All other systems reviewed and are negative.      Objective:     Blood pressure 139/86, pulse 88, temperature 97.8 F (36.6 C), height 6\' 2"  (1.88 m), weight (!) 345 lb 4.8 oz (156.6 kg), SpO2 94 %.  Cardiovascular Studies  EKG 08/05/2018: Normal sinus rhythm at the rate of 84 bpm, left axis deviation, left anterior fascicular block. Right bundle branch block. PRWP, cannot exclude anterior infarct old, Nonspecific ST-T abnormality, cannot exclude inferior ischemia. No  changes from EKG 01/28/2018  Echocardiogram 07/26/2018: Normal LV systolic function with EF 56%. Left ventricle cavity is normal in size. Moderate concentric hypertrophy of the left ventricle. Normal global wall motion. Doppler evidence of grade I (impaired) diastolic dysfunction, normal LAP. Calculated EF 56%. Left atrial cavity is mildly dilated. Aneurysmal interatrial septum without 2D or color Doppler evidence of interatrial shunt. No significant valvular abnormalities noted.  IVC is dilated with respiratory variation. This may suggest elevated right heart pressure  Lexiscan Myoview stress test 06/28/2018: 1. Lexiscan stress test was performed. Exercise capacity was not assessed. Stress symptoms included dyspnea, dizziness.  Resting blood pressure was 132/90 mmHg and peak effect blood pressure was 138/82 mmHg. The resting and stress electrocardiogram demonstrated sinus tachycardia, left anterior fascicular block, possible old posterior infarct, no resting arrhythmias and normal repolarization.  Stress EKG is non diagnostic for ischemia as it is a pharmacologic stress.  2. The overall quality of the study is fair.  Left ventricular cavity is noted to be normal on the rest and stress studies.  Gated SPECT images reveal normal myocardial thickening and wall motion.  The left ventricular ejection fraction was calculated or visually estimated to be 54%.  REST and STRESS images demonstrate decreased tracer uptake in the basal inferior and mid inferior segments of the left ventricle, likely due to tissue attenuation artifact (BMI 42). Ischemia in this region cannot be excluded. Recommend clinical correlation.  3. Low risk study.    Coronary Angiogram [12/31/2010]: Heart cath 12/31/10: Noral LVEF. Slow flow in coronary arteries without stenosis. Mild pulmonary HTN.    Physical Exam  Constitutional: He is oriented to person, place, and time. Vital signs are normal. He appears well-developed and  well-nourished.  Morbidly obese  HENT:  Head: Normocephalic and atraumatic.  Neck: Trachea normal and normal range of motion. No thyromegaly present.  Cardiovascular: Normal rate, regular rhythm and intact distal pulses.  Murmur heard.  Early systolic murmur is present with a grade of 1/6 at the upper right sternal border. 2+ pitting edema bilateral lower extremities Trace leg edema  Pulmonary/Chest: Effort  normal and breath sounds normal. No accessory muscle usage. No respiratory distress.  Abdominal: Soft. Bowel sounds are normal.  Musculoskeletal: Normal range of motion.  Neurological: He is alert and oriented to person, place, and time.  Skin: Skin is warm and dry.  Vitals reviewed.      Assessment & Recommendations:     ICD-10-CM   1. Essential hypertension  I10 EKG 12-Lead  2. Chronic diastolic (congestive) heart failure (HCC)  I50.32   3. Obesity, Class III, BMI 40-49.9 (morbid obesity) (HCC)  E66.01   4. Shortness of breath  R06.02    Plan: I have discussed recently obtained stress test and echocardiogram results with the patient. Stress test was considered low risk study, perfusion abnormality likely breast tissue attenuation. Echocardiogram showed grade 1 diastolic dysfunction, normal LVEF. I suspect his diastolic dysfunction is related to weight and hypertension. Blood pressure is now well controlled. Tolerating medications well.   Unfortunately, he has gained back the 15 lbs that he had lost at our last visit. I suspect he is consuming more calories than he realizes, encouraged him to start calorie counting with use of apps on smart phone. I have also encouraged him to cut back on his daily water consumption in view of diastolic dysfunction. Discussed resuming regular exercise with stationary bicycle or walking. We are limited on weight loss medications such as Saxenda due to cost. He may benefit from enrollment with bariatric program. I will see him back in 3 months for  follow up on his weight and hypertension.   Jeri Lager, FNP-C [facility], [place] Office: [phone] Fax: [phone]

## 2018-08-06 ENCOUNTER — Encounter: Payer: Self-pay | Admitting: Cardiology

## 2018-08-09 ENCOUNTER — Telehealth: Payer: Self-pay

## 2018-08-19 ENCOUNTER — Telehealth: Payer: Self-pay

## 2018-08-19 ENCOUNTER — Other Ambulatory Visit: Payer: Self-pay | Admitting: Cardiology

## 2018-08-19 MED ORDER — BIDIL 20-37.5 MG PO TABS: 1.0000 | ORAL_TABLET | Freq: Three times a day (TID) | ORAL | 1 refills | Status: DC

## 2018-08-19 NOTE — Telephone Encounter (Signed)
Pt will come pick up bidil samples; He was told to stop hyralazine and viagra and to take bidil 1 tablet three times daily

## 2018-08-19 NOTE — Telephone Encounter (Signed)
Can we get some labs from Dr. Ayesha Rumpf office. I need to know his kidney function

## 2018-08-19 NOTE — Progress Notes (Signed)
Patient called today reporting elevated blood pressure. I do not have his recent kidney function and will be unable to have this performed in view of holiday weekend. Will stop hydralazine and change to Bidil 20-37.5 mg BID. Advised of interaction of sildenafil with medication and advised not to use.

## 2018-08-19 NOTE — Telephone Encounter (Signed)
Pt called stating that his bp this morning was 160/102 and it has running like that the last couple of days. No symptoms. Taking meds as directed. Please advise.//ah

## 2018-08-19 NOTE — Telephone Encounter (Signed)
Spoke with [place] and she will fax over. BG is watching out for them.//ah

## 2018-08-19 NOTE — Telephone Encounter (Signed)
Other option is he can stop hydralazine and start Bidil

## 2018-10-13 ENCOUNTER — Other Ambulatory Visit: Payer: Self-pay

## 2018-10-13 ENCOUNTER — Ambulatory Visit (INDEPENDENT_AMBULATORY_CARE_PROVIDER_SITE_OTHER): Payer: Medicare Other | Admitting: Pulmonary Disease

## 2018-10-13 ENCOUNTER — Encounter: Payer: Self-pay | Admitting: Pulmonary Disease

## 2018-10-13 VITALS — BP 114/72 | HR 86 | Ht 74.0 in | Wt 344.0 lb

## 2018-10-13 DIAGNOSIS — Z23 Encounter for immunization: Secondary | ICD-10-CM

## 2018-10-13 DIAGNOSIS — E669 Obesity, unspecified: Secondary | ICD-10-CM

## 2018-10-13 DIAGNOSIS — Z9989 Dependence on other enabling machines and devices: Secondary | ICD-10-CM

## 2018-10-13 DIAGNOSIS — G4733 Obstructive sleep apnea (adult) (pediatric): Secondary | ICD-10-CM | POA: Diagnosis not present

## 2018-10-13 DIAGNOSIS — G4730 Sleep apnea, unspecified: Secondary | ICD-10-CM | POA: Diagnosis not present

## 2018-10-13 NOTE — Progress Notes (Signed)
[facility]  Chief Complaint  Patient presents with  . 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 male with obstructive sleep apnea.  Uses CPAP nightly.  Has full face mask.  Not having sinus congestion, sore throat, aerophagia.  Keeps a dry mouth, but has this since tx for oral cancer.  Goes to bed at 9 pm.  No issues falling asleep.  Uses bathroom once per night.  Gets up at 5 or 6 am.  Feels rested.  Doesn't take naps.  Physical Exam:   Appearance - well kempt   ENMT - clear nasal mucosa, midline nasal  septum, no oral exudates, no LAN, trachea midline  Respiratory - normal chest wall, normal respiratory effort, no accessory muscle use, no wheeze/rales  CV - s1s2 regular rate and rhythm, no murmurs, no peripheral edema, radial pulses symmetric  GI - soft, non tender, no masses  Lymph - no adenopathy noted in neck and axillary areas  MSK - normal gait  Ext - no cyanosis, clubbing, or joint inflammation noted  Skin - no rashes, lesions, or ulcers  Neuro - normal strength, oriented x 3  Psych - normal mood and affect   Assessment/Plan:   Obstructive sleep apnea. - he is compliant with CPAP and reports benefit from therapy - continue CPAP 11 cm H2O  Obesity. - discussed importance of weight loss  Vaccination. - administered influenza vaccine today   Patient Instructions  Flu shot today  Follow up in 1 year    Chesley Mires, MD [place] Pager: [phone] 10/13/2018, 11:03 AM  Flow Sheet    Sleep tests:  PSG 12/01/11 >> AHI 43 CPAP 09/12/18 to 10/11/18 >> used on 29 of 30 nights  with average 8 hrs 22 min.  Average AHI 0.7 with CPAP 11 cm H2O  Medications:   Allergies as of 10/13/2018      Reactions   Azilsartan Swelling   Avoid ARB and ACEI per MD   Advil [ibuprofen]    Feels "jittery"      Medication List       Accurate as of October 13, 2018 11:03 AM. If you have any questions, ask your nurse or doctor.        allopurinol 300 MG tablet Commonly known as: ZYLOPRIM Take 300 mg by mouth daily as needed.   aspirin 81 MG tablet Take 81 mg by mouth daily.   BiDil 20-37.5 MG tablet Generic drug: isosorbide-hydrALAZINE Take 1 tablet by mouth 3 (three) times daily.   calcium citrate-vitamin D 500-500 MG-UNIT chewable tablet Chew 1 tablet by mouth 2 (two) times daily.   carvedilol 25 MG tablet Commonly known as: COREG Take 25 mg by mouth 2 (two) times daily with a meal.   cetirizine 10 MG tablet Commonly known as: ZYRTEC Take 10 mg by mouth daily.   cloNIDine 0.1 MG tablet Commonly known as: CATAPRES TAKE 1 TABLET(0.1 MG) BY MOUTH TWICE DAILY What changed:   how much to take  how to take this  when to take  this   diphenhydrAMINE 25 MG tablet Commonly known as: BENADRYL Take 1 tablet (25 mg total) by mouth every 6 (six) hours. What changed:   when to take this  reasons to take this   eplerenone 50 MG tablet Commonly known as: INSPRA Take 50 mg by mouth daily.   furosemide 40 MG tablet Commonly known as: LASIX Take 40 mg by mouth 2 (two) times daily.   gabapentin 300 MG capsule Commonly known as: NEURONTIN Take 300 mg by mouth 3 (three) times daily.   hydrocortisone-pramoxine rectal foam Commonly known as: PROCTOFOAM-HC Place 1 applicator rectally 2 (two) times daily. What changed:   when to take this  reasons to take this   metFORMIN 500 MG tablet Commonly known as: GLUCOPHAGE Take 500 mg by mouth 2 (two) times daily with a meal.   NIFEdipine 90 MG 24 hr tablet Commonly known as: ADALAT CC Take 90 mg by mouth  daily.   omeprazole 20 MG capsule Commonly known as: PRILOSEC Take 20 mg by mouth daily.   oxyCODONE-acetaminophen 10-325 MG tablet Commonly known as: PERCOCET Take 1 tablet by mouth every 6 (six) hours as needed for pain. What changed: when to take this       Past Surgical History:  He  has a past surgical history that includes Knee arthroscopy (Right, ~ 1994); Multiple tooth extractions (~ 2008); Multiple extractions with alveoloplasty (N/A, 07/08/2013); Colonoscopy w/ biopsies and polypectomy; Direct laryngoscopy (01/05/2014); Direct laryngoscopy (N/A, 01/05/2014); left and right heart catheterization with coronary/graft angiogram (N/A, 12/31/2010); Colonoscopy (Left, 04/11/2014); and Colonoscopy (N/A, 04/20/2014).  Family History:  His family history includes Aneurysm in his father; Cancer in his mother and sister; Diabetes in his father; Hypertension in his mother; Stroke in his mother.  Social History:  He  reports that he quit smoking about 5 years ago. His smoking use included cigarettes and cigars. He has a 84.00 pack-year smoking history. He has never used smokeless tobacco. He reports that he does not drink alcohol or use drugs.

## 2018-10-13 NOTE — Patient Instructions (Signed)
Flu shot today Follow up in 1 year 

## 2018-11-02 ENCOUNTER — Other Ambulatory Visit: Payer: Self-pay

## 2018-11-02 DIAGNOSIS — R011 Cardiac murmur, unspecified: Secondary | ICD-10-CM

## 2018-11-02 MED ORDER — BIDIL 20-37.5 MG PO TABS: 1.0000 | ORAL_TABLET | Freq: Three times a day (TID) | ORAL | 1 refills | Status: DC

## 2018-11-11 ENCOUNTER — Encounter: Payer: Self-pay | Admitting: Cardiology

## 2018-11-11 ENCOUNTER — Other Ambulatory Visit: Payer: Self-pay

## 2018-11-11 ENCOUNTER — Ambulatory Visit (INDEPENDENT_AMBULATORY_CARE_PROVIDER_SITE_OTHER): Payer: Medicare Other | Admitting: Cardiology

## 2018-11-11 VITALS — BP 129/80 | HR 88 | Temp 97.1°F | Ht 73.0 in | Wt 348.0 lb

## 2018-11-11 DIAGNOSIS — I10 Essential (primary) hypertension: Secondary | ICD-10-CM | POA: Diagnosis not present

## 2018-11-11 DIAGNOSIS — I5032 Chronic diastolic (congestive) heart failure: Secondary | ICD-10-CM | POA: Diagnosis not present

## 2018-11-11 NOTE — Progress Notes (Signed)
Primary Physician:  [name], MD   Patient ID: Melvin Hudson, male    DOB: [birth year], 63 y.o.   MRN: [id number]  Subjective:    Chief Complaint  Patient presents with  . Hypertension  . diastolic heart failure    HPI: Melvin Hudson  is a 63 y.o. male  with history of long-term hypertension, diabetes, chronic dyspnea, no significant coronary artery disease by coronary angiography in 2012, has slow flow in the coronary arteries suggestive of diastolic dysfunction. He has history of tobacco use disorder which is resolved and quit since throat cancer diagnosis in 2015. He has severe sleep apnea and is presently on CPAP and is compliant.  Patient presents for follow up for hypertension.  Presently doing well, blood pressure is well controlled. Tolerating medications well. Unfortunately, he has not lost weight, but is planning to start exercising.  He denies any chest pain, palpitations, or symptoms of claudication. Dyspnea on exertion is stable.   Past Medical History:  Diagnosis Date  . Angioedema 09/17/11   tongue and lips  . Arthritis    "both of my legs and feet" (01/05/2014)  . Erectile dysfunction 04/20/2018  . GI bleed   . Heart murmur 04/20/2018  . Hx of gout   . Hypertension   . Obesity   . OSA on CPAP   . Pure hypercholesterolemia 04/20/2018  . S/P radiation therapy 07/26/2013-09/15/2013   Right tonsil/bilateral neck/ 7000 cGy  . Squamous cell carcinoma of right tonsil ([place]) 06/14/13    Past Surgical History:  Procedure Laterality Date  . COLONOSCOPY Left 04/11/2014   Procedure: COLONOSCOPY;  Surgeon: Arta Silence, MD;  Location: [facility] ENDOSCOPY;  Service: Endoscopy;  Laterality: Left;  . COLONOSCOPY N/A 04/20/2014   Procedure: COLONOSCOPY;  Surgeon: Lear Ng, MD;  Location: [facility] ENDOSCOPY;  Service: Endoscopy;  Laterality: N/A;  . COLONOSCOPY W/ BIOPSIES AND POLYPECTOMY     benign  . DIRECT LARYNGOSCOPY  01/05/2014  . DIRECT LARYNGOSCOPY N/A 01/05/2014   Procedure: DIRECT LARYNGOSCOPY AND BIOPSY;  Surgeon: [name], MD;  Location: [place];  Service: ENT;  Laterality: N/A;  . KNEE ARTHROSCOPY Right ~ 1994  . LEFT AND RIGHT HEART CATHETERIZATION WITH CORONARY/GRAFT ANGIOGRAM N/A 12/31/2010   Procedure: LEFT AND RIGHT HEART CATHETERIZATION WITH Beatrix Fetters;  Surgeon: Laverda Page, MD;  Location: [facility] CATH LAB;  Service: Cardiovascular;  Laterality: N/A;  . MULTIPLE EXTRACTIONS WITH ALVEOLOPLASTY N/A 07/08/2013   Procedure: Extraction of tooth #'s 22, 27 with alveoloplasty and bilateal mandibular facial exostoses reductions;  Surgeon: Lenn Cal, DDS;  Location: [facility] ORS;  Service: Oral Surgery;  Laterality: N/A;  . MULTIPLE TOOTH EXTRACTIONS  ~ 2008    Social History   Socioeconomic History  . Marital status: Single    Spouse name: Not on file  . Number of children: 0  . Years of education: Not on file  . Highest education level: Not on file  Occupational History  . Not on file  Social Needs  . Financial resource strain: Not on file  . Food insecurity    Worry: Not on file    Inability: Not on file  . Transportation needs    Medical: No    Non-medical: No  Tobacco Use  . Smoking status: Former Smoker    Packs/day: 2.00    Years: 42.00    Pack years: 84.00    Types: Cigarettes, Cigars    Quit date: 06/17/2013    Years since quitting: 5.4  .  Smokeless tobacco: Never Used  . Tobacco comment: "Quit smoking cigarettes in ~  2007"  Substance and Sexual Activity  . Alcohol use: Never    Frequency: Never  . Drug use: No  . Sexual activity: Not Currently  Lifestyle  . Physical activity    Days per week: Not on file    Minutes per session: Not on file  . Stress: Not on file  Relationships  . Social Herbalist on phone: Not on file    Gets together: Not on file    Attends religious service: Not on file    Active member of club or organization: Not on file    Attends meetings of clubs or  organizations: Not on file    Relationship status: Not on file  . Intimate partner violence    Fear of current or ex partner: No    Emotionally abused: No    Physically abused: No    Forced sexual activity: No  Other Topics Concern  . Not on file  Social History Narrative  . Not on file    Review of Systems  Constitution: Negative for decreased appetite, malaise/fatigue, weight gain and weight loss.  Eyes: Negative for visual disturbance.  Cardiovascular: Positive for dyspnea on exertion and leg swelling. Negative for chest pain, claudication, orthopnea, palpitations and syncope.  Respiratory: Negative for hemoptysis and wheezing.   Endocrine: Negative for cold intolerance and heat intolerance.  Hematologic/Lymphatic: Does not bruise/bleed easily.  Skin: Negative for nail changes.  Musculoskeletal: Positive for joint pain. Negative for muscle weakness and myalgias.  Gastrointestinal: Negative for abdominal pain, change in bowel habit, nausea and vomiting.  Neurological: Negative for difficulty with concentration, dizziness, focal weakness and headaches.  Psychiatric/Behavioral: Negative for altered mental status and suicidal ideas.  All other systems reviewed and are negative.     Objective:  Blood pressure 129/80, pulse 88, temperature (!) 97.1 F (36.2 C), height 6\' 1"  (1.854 m), weight (!) 348 lb (157.9 kg), SpO2 93 %. Body mass index is 45.91 kg/m.    Physical Exam  Constitutional: He is oriented to person, place, and time. Vital signs are normal. He appears well-developed and well-nourished.  Morbidly obese  HENT:  Head: Normocephalic and atraumatic.  Neck: Trachea normal and normal range of motion. No thyromegaly present.  Cardiovascular: Normal rate, regular rhythm and intact distal pulses.  Murmur heard.  Early systolic murmur is present with a grade of 1/6 at the upper right sternal border. 2+ pitting edema bilateral lower extremities Trace leg edema   Pulmonary/Chest: Effort normal and breath sounds normal. No accessory muscle usage. No respiratory distress.  Abdominal: Soft. Bowel sounds are normal.  Musculoskeletal: Normal range of motion.  Neurological: He is alert and oriented to person, place, and time.  Skin: Skin is warm and dry.  Vitals reviewed.  Radiology: No results found.  Laboratory examination:    CMP Latest Ref Rng & Units 08/31/2016 08/31/2016 08/30/2016  Glucose 65 - 99 mg/dL 129(H) 154(H) 156(H)  BUN 6 - 20 mg/dL 7 6 5(L)  Creatinine 0.61 - 1.24 mg/dL 0.87 0.98 1.01  Sodium 135 - 145 mmol/L 139 139 139  Potassium 3.5 - 5.1 mmol/L 3.4(L) 3.2(L) 3.4(L)  Chloride 101 - 111 mmol/L 104 103 102  CO2 22 - 32 mmol/L 28 30 29   Calcium 8.9 - 10.3 mg/dL 8.7(L) 8.7(L) 9.5  Total Protein 6.5 - 8.1 g/dL - - 7.1  Total Bilirubin 0.3 - 1.2 mg/dL - - 0.9  Alkaline Phos 38 - 126 U/L - - 145(H)  AST 15 - 41 U/L - - 21  ALT 17 - 63 U/L - - 21   CBC Latest Ref Rng & Units 09/03/2016 09/03/2016 09/03/2016  WBC 4.0 - 10.5 K/uL - - -  Hemoglobin 13.0 - 17.0 g/dL 10.4(L) 9.7(L) 9.9(L)  Hematocrit 39.0 - 52.0 % 30.3(L) 28.1(L) 28.4(L)  Platelets 150 - 400 K/uL - - -   Lipid Panel  No results found for: CHOL, TRIG, HDL, CHOLHDL, VLDL, LDLCALC, LDLDIRECT HEMOGLOBIN A1C No results found for: HGBA1C, MPG TSH Recent Labs    04/16/18 1033  TSH 1.160    PRN Meds:. There are no discontinued medications. Current Meds  Medication Sig  . allopurinol (ZYLOPRIM) 300 MG tablet Take 300 mg by mouth daily as needed.   [name] aspirin 81 MG tablet Take 81 mg by mouth daily.   . calcium citrate-vitamin D 500-500 MG-UNIT chewable tablet Chew 1 tablet by mouth 2 (two) times daily.  . carvedilol (COREG) 25 MG tablet Take 25 mg by mouth 2 (two) times daily with a meal.  . cetirizine (ZYRTEC) 10 MG tablet Take 10 mg by mouth daily.   . cloNIDine (CATAPRES) 0.1 MG tablet TAKE 1 TABLET(0.1 MG) BY MOUTH TWICE DAILY (Patient taking differently: Take 0.1  mg by mouth 2 (two) times daily. TAKE 1 TABLET(0.1 MG) BY MOUTH TWICE DAILY)  . diphenhydrAMINE (BENADRYL) 25 MG tablet Take 1 tablet (25 mg total) by mouth every 6 (six) hours. (Patient taking differently: Take 25 mg by mouth every 6 (six) hours as needed. )  . eplerenone (INSPRA) 50 MG tablet Take 50 mg by mouth daily.  . furosemide (LASIX) 40 MG tablet Take 40 mg by mouth 2 (two) times daily.  [name] gabapentin (NEURONTIN) 300 MG capsule Take 300 mg by mouth 3 (three) times daily.   . hydrocortisone-pramoxine (PROCTOFOAM-HC) rectal foam Place 1 applicator rectally 2 (two) times daily. (Patient taking differently: Place 1 applicator rectally as needed. )  . isosorbide-hydrALAZINE (BIDIL) 20-37.5 MG tablet Take 1 tablet by mouth 3 (three) times daily.  . metFORMIN (GLUCOPHAGE) 500 MG tablet Take 500 mg by mouth 2 (two) times daily with a meal.   . Multiple Vitamin (MULTIVITAMIN WITH MINERALS) TABS tablet Take 1 tablet by mouth daily.  [name] NIFEdipine (ADALAT CC) 90 MG 24 hr tablet Take 90 mg by mouth daily.  [name] omeprazole (PRILOSEC) 20 MG capsule Take 20 mg by mouth daily.   [name] oxyCODONE-acetaminophen (PERCOCET) 10-325 MG per tablet Take 1 tablet by mouth every 6 (six) hours as needed for pain. (Patient taking differently: Take 1 tablet by mouth 4 (four) times daily. )  . Vitamin D, Ergocalciferol, (DRISDOL) 1.25 MG (50000 UT) CAPS capsule Take 50,000 Units by mouth every 7 (seven) days.    Cardiac Studies:   Echocardiogram 07/26/2018: Normal LV systolic function with EF 56%. Left ventricle cavity is normal in size. Moderate concentric hypertrophy of the left ventricle. Normal global wall motion. Doppler evidence of grade I (impaired) diastolic dysfunction, normal LAP. Calculated EF 56%. Left atrial cavity is mildly dilated. Aneurysmal interatrial septum without 2D or color Doppler evidence of interatrial shunt. No significant valvular abnormalities noted.  IVC is dilated with respiratory variation. This  may suggest elevated right heart pressure  Lexiscan Myoview stress test 06/28/2018: 1. Lexiscan stress test was performed. Exercise capacity was not assessed. Stress symptoms included dyspnea, dizziness. Resting blood pressure was 132/90 mmHg and peak effect blood pressure was 138/82  mmHg. The resting and stress electrocardiogram demonstrated sinus tachycardia, left anterior fascicular block, possible old posterior infarct, no resting arrhythmias and normal repolarization. Stress EKG is non diagnostic for ischemia as it is a pharmacologic stress.  2. The overall quality of the study is fair. Left ventricular cavity is noted to be normal on the rest and stress studies. Gated SPECT images reveal normal myocardial thickening and wall motion. The left ventricular ejection fraction was calculated or visually estimated to be 54%. REST and STRESS images demonstrate decreased tracer uptake in the basal inferior and mid inferior segments of the left ventricle, likely due to tissue attenuation artifact (BMI 42). Ischemia in this region cannot be excluded. Recommend clinical correlation.  3. Low risk study.   Coronary Angiogram [12/31/2010]: Heart cath 12/31/10: Noral LVEF. Slow flow in coronary arteries without stenosis. Mild pulmonary HTN.   Assessment:   Essential hypertension  Chronic diastolic (congestive) heart failure (HCC)  Obesity, Class III, BMI 40-49.9 (morbid obesity) ([place])  EKG 08/05/2018: Normal sinus rhythm at the rate of 84 bpm, left axis deviation, left anterior fascicular block. Right bundle branch block. PRWP, cannot exclude anterior infarct old, Nonspecific ST-T abnormality, cannot exclude inferior ischemia. No changes from EKG 01/28/2018  Recommendations:   Patient is presently doing well, no complaints today.  I have reviewed his home blood pressure readings, blood pressure is very well controlled.  Tolerating medications well.  Has not made any changes to his medications  today.  He has minimal leg edema on exam today, overall feeling well.  Diastolic heart failure appears to be stable.  He was again counseled on the importance of weight loss.  It appears he has been making diet changes, we have discussed regular exercise and he is planning to start walking several days a week.  I have recommended that he try this as well as some home exercises.  Patient is stable, we will see him back in 6 months or sooner if problems  Miquel Dunn, MSN, APRN, FNP-C [facility]. [place] Office: [phone] Fax: [phone]

## 2018-11-23 NOTE — Telephone Encounter (Signed)
Error

## 2018-12-06 ENCOUNTER — Other Ambulatory Visit: Payer: Self-pay

## 2018-12-06 MED ORDER — CLONIDINE HCL 0.1 MG PO TABS: ORAL_TABLET | ORAL | 1 refills | Status: DC

## 2018-12-14 ENCOUNTER — Encounter (HOSPITAL_COMMUNITY): Payer: Self-pay | Admitting: Dentistry

## 2018-12-14 ENCOUNTER — Ambulatory Visit (HOSPITAL_COMMUNITY): Payer: Medicaid - Dental | Admitting: Dentistry

## 2018-12-14 ENCOUNTER — Other Ambulatory Visit: Payer: Self-pay

## 2018-12-14 VITALS — BP 126/72 | HR 80 | Temp 99.1°F

## 2018-12-14 DIAGNOSIS — Z463 Encounter for fitting and adjustment of dental prosthetic device: Secondary | ICD-10-CM

## 2018-12-14 DIAGNOSIS — Z923 Personal history of irradiation: Secondary | ICD-10-CM

## 2018-12-14 DIAGNOSIS — C099 Malignant neoplasm of tonsil, unspecified: Secondary | ICD-10-CM

## 2018-12-14 DIAGNOSIS — K08109 Complete loss of teeth, unspecified cause, unspecified class: Secondary | ICD-10-CM

## 2018-12-14 DIAGNOSIS — K0820 Unspecified atrophy of edentulous alveolar ridge: Secondary | ICD-10-CM

## 2018-12-14 DIAGNOSIS — K117 Disturbances of salivary secretion: Secondary | ICD-10-CM

## 2018-12-14 NOTE — Patient Instructions (Signed)
COVID-19 Education: The signs and symptoms of COVID-19 were discussed with the patient and how to seek care for testing (follow up with PCP or arrange E-visit).   The importance of social distancing was discussed today.   Plan:  1. Keep dentures out if sore spots arise. Use salt water rinses as needed aid healing. 2. Return to clinic for denture adjustment as scheduled. Call if problems arise before then.   Lenn Cal, DDS

## 2018-12-14 NOTE — Progress Notes (Signed)
12/14/2018  Patient Name:   Melvin Hudson Date of Birth:   Dec 18, 1955 Medical Record Number: [id number]   COVID 19 SCREENING: The patient does not symptoms concerning for COVID-19 infection (Including fever, chills, cough, or new SHORTNESS OF BREATH).   BP 126/72 (BP Location: Left Arm)   Pulse 80   Temp 99.1 F (37.3 C)   Melvin Hudson is a 63 year old male that presents for periodic oral exam and evaluation of dentures. Patient has a history of squamous cell carcinoma of the right tonsil. Patient had extraction of remaining teeth with alveoloplasty and lateral exostoses reductions on 07/06/2013. Patient then underwent radiation therapy from 07/26/2013 through 09/15/2013. Patient had upper and lower complete dentures fabricated and were inserted on 11/08/2014. Patient has been seen for multiple denture adjustment appointments since that time. The patient now presents for periodic oral examination and denture recall. Patient was recently seen by Dr. Constance Holster and his Cardiology and CPAP team. Patient is using his CPAP nightly as instructed. Patient's sister passed away previously. Patient has a sister and niece in [place], [place]. Niece is getting married soon.  Medical Hx Update:  Past Medical History:  Diagnosis Date  . Angioedema 09/17/11   tongue and lips  . Arthritis    "both of my legs and feet" (01/05/2014)  . Erectile dysfunction 04/20/2018  . GI bleed   . Heart murmur 04/20/2018  . Hx of gout   . Hypertension   . Obesity   . OSA on CPAP   . Pure hypercholesterolemia 04/20/2018  . S/P radiation therapy 07/26/2013-09/15/2013   Right tonsil/bilateral neck/ 7000 cGy  . Squamous cell carcinoma of right tonsil ([place]) 06/14/13  .  Past Surgical History:  Procedure Laterality Date  . COLONOSCOPY Left 04/11/2014   Procedure: COLONOSCOPY;  Surgeon: Melvin Silence, MD;  Location: [facility] ENDOSCOPY;  Service: Endoscopy;  Laterality: Left;  . COLONOSCOPY N/A 04/20/2014   Procedure:  COLONOSCOPY;  Surgeon: Lear Ng, MD;  Location[facility] ENDOSCOPY;  Service: Endoscopy;  Laterality: N/A;  . COLONOSCOPY W/ BIOPSIES AND POLYPECTOMY     benign  . DIRECT LARYNGOSCOPY  01/05/2014  . DIRECT LARYNGOSCOPY N/A 01/05/2014   Procedure: DIRECT LARYNGOSCOPY AND BIOPSY;  Surgeon: Melvin Gala, MD;  Location: [facility];  Service: ENT;  Laterality: N/A;  . KNEE ARTHROSCOPY Right ~ 1994  . LEFT AND RIGHT HEART CATHETERIZATION WITH CORONARY/GRAFT ANGIOGRAM N/A 12/31/2010   Procedure: LEFT AND RIGHT HEART CATHETERIZATION WITH Melvin Hudson;  Surgeon: Laverda Page, MD;  Location: [facility] CATH LAB;  Service: Cardiovascular;  Laterality: N/A;  . MULTIPLE EXTRACTIONS WITH ALVEOLOPLASTY N/A 07/08/2013   Procedure: Extraction of tooth #'s 22, 27 with alveoloplasty and bilateal mandibular facial exostoses reductions;  Surgeon: Lenn Cal, DDS;  Location: [facility] ORS;  Service: Oral Surgery;  Laterality: N/A;  . MULTIPLE TOOTH EXTRACTIONS  ~ 2008    ALLERGIES/ADVERSE DRUG REACTIONS: Allergies  Allergen Reactions  . Azilsartan Swelling    Avoid ARB and ACEI per MD  . Advil [Ibuprofen]     Feels "jittery"    MEDICATIONS: Current Outpatient Medications  Medication Sig Dispense Refill  . allopurinol (ZYLOPRIM) 300 MG tablet Take 300 mg by mouth daily as needed.     [name] aspirin 81 MG tablet Take 81 mg by mouth daily.     . calcium citrate-vitamin D 500-500 MG-UNIT chewable tablet Chew 1 tablet by mouth 2 (two) times daily.    . carvedilol (COREG) 25 MG tablet Take 25 mg by  mouth 2 (two) times daily with a meal.    . cetirizine (ZYRTEC) 10 MG tablet Take 10 mg by mouth daily.   2  . cloNIDine (CATAPRES) 0.1 MG tablet TAKE 1 TABLET(0.1 MG) BY MOUTH TWICE DAILY 180 tablet 1  . diphenhydrAMINE (BENADRYL) 25 MG tablet Take 1 tablet (25 mg total) by mouth every 6 (six) hours. (Patient taking differently: Take 25 mg by mouth every 6 (six) hours as needed. ) 20 tablet 0  . eplerenone  (INSPRA) 50 MG tablet Take 50 mg by mouth daily.  2  . furosemide (LASIX) 40 MG tablet Take 40 mg by mouth 2 (two) times daily.  2  . gabapentin (NEURONTIN) 300 MG capsule Take 300 mg by mouth 3 (three) times daily.   2  . hydrocortisone-pramoxine (PROCTOFOAM-HC) rectal foam Place 1 applicator rectally 2 (two) times daily. (Patient taking differently: Place 1 applicator rectally as needed. ) 10 g 0  . isosorbide-hydrALAZINE (BIDIL) 20-37.5 MG tablet Take 1 tablet by mouth 3 (three) times daily. 90 tablet 1  . metFORMIN (GLUCOPHAGE) 500 MG tablet Take 500 mg by mouth 2 (two) times daily with a meal.     . Multiple Vitamin (MULTIVITAMIN WITH MINERALS) TABS tablet Take 1 tablet by mouth daily.    [name] NIFEdipine (ADALAT CC) 90 MG 24 hr tablet Take 90 mg by mouth daily.    [name] omeprazole (PRILOSEC) 20 MG capsule Take 20 mg by mouth daily.   4  . oxyCODONE-acetaminophen (PERCOCET) 10-325 MG per tablet Take 1 tablet by mouth every 6 (six) hours as needed for pain. (Patient taking differently: Take 1 tablet by mouth 4 (four) times daily. ) 120 tablet 0  . Vitamin D, Ergocalciferol, (DRISDOL) 1.25 MG (50000 UT) CAPS capsule Take 50,000 Units by mouth every 7 (seven) days.     No current facility-administered medications for this visit.     C/C: Patient is not complaining of any denture irritation.   HPI:  Patient has a history of squamous cell carcinoma of the right tonsil. Patient had extraction of remaining teeth with alveoloplasty and lateral exostoses reductions on 07/06/2013. Patient then underwent radiation therapy from 07/26/2013 through 09/15/2013. Patient had upper and lower complete dentures fabricated and were inserted on 11/08/2014. Patient has been seen for multiple denture adjustment appointments since that time.  DENTAL EXAM: General: Patient well-developed, well-nourished male in no acute distress. Vitals: BP 126/72 (BP Location: Left Arm)   Pulse 80   Temp 99.1 F (37.3 C)  Extraoral  Exam: There is no palpable lymphadenopathy.  There are no TMJ Symptoms. Intraoral  Exam: Patient has xerostomia. There is no evidence of denture irritation. Dentition: The patient is edentulous. There is atrophy of the lower edentulous alveolar ridges. Prosthodontic: Patient has upper and lower complete dentures that are stable and retentive. Pressure indicating paste was applied to the upper and lower dentures. Minimal adjustments were needed. [place] Occlusion:  Patient was evaluated in centric relation and protrusive strokes. No adjustments were made.  Patient tolerated the procedure well.   Assessments: 1. Patient is edentulous. 2. There is atrophy of the edentulous alveolar ridges. 3. Patient has xerostomia. 4. Stable and retentive upper and lower dentures.  Plan:  1. Keep dentures out if sore spots arise. Use salt water rinses as needed aid healing. 2. Return to clinic for denture adjustment as scheduled. Call if problems arise before then.   Lenn Cal, DDS

## 2018-12-15 ENCOUNTER — Other Ambulatory Visit: Payer: Self-pay

## 2018-12-15 MED ORDER — CLONIDINE HCL 0.1 MG PO TABS: ORAL_TABLET | ORAL | 1 refills | Status: DC

## 2019-03-14 ENCOUNTER — Ambulatory Visit (INDEPENDENT_AMBULATORY_CARE_PROVIDER_SITE_OTHER): Payer: Medicare Other | Admitting: Cardiology

## 2019-03-14 ENCOUNTER — Encounter: Payer: Self-pay | Admitting: Cardiology

## 2019-03-14 ENCOUNTER — Other Ambulatory Visit: Payer: Self-pay

## 2019-03-14 VITALS — BP 138/82 | HR 87 | Temp 98.7°F | Ht 74.0 in | Wt 348.8 lb

## 2019-03-14 DIAGNOSIS — I5032 Chronic diastolic (congestive) heart failure: Secondary | ICD-10-CM | POA: Diagnosis not present

## 2019-03-14 DIAGNOSIS — I10 Essential (primary) hypertension: Secondary | ICD-10-CM

## 2019-03-14 MED ORDER — BIDIL 20-37.5 MG PO TABS: 1.0000 | ORAL_TABLET | Freq: Three times a day (TID) | ORAL | 6 refills | Status: DC

## 2019-03-14 MED ORDER — BIDIL 20-37.5 MG PO TABS: 1.0000 | ORAL_TABLET | Freq: Three times a day (TID) | ORAL | 3 refills | Status: DC

## 2019-03-14 NOTE — Progress Notes (Signed)
Primary Physician:  [name], MD   Patient ID: Melvin Hudson, male    DOB: 1955-05-05, 64 y.o.   MRN: [id number]  Subjective:    Chief Complaint  Patient presents with  . Hypertension    HPI: Melvin Hudson  is a 64 y.o. male  with history of long-term hypertension, diabetes, chronic dyspnea, no significant coronary artery disease by coronary angiography in 2012, has slow flow in the coronary arteries suggestive of diastolic dysfunction. He has history of tobacco use disorder which is resolved and quit since throat cancer diagnosis in 2015. He has severe sleep apnea and is presently on CPAP and is compliant.  Patient presents for 6 month follow up.  Presently doing well, blood pressure has been well controlled.  Tolerating medications well. He has been compliant with his diet, but continues to not lose weight. He is exercising.  He denies any chest pain, palpitations, or symptoms of claudication. Dyspnea on exertion is stable.   Past Medical History:  Diagnosis Date  . Angioedema 09/17/11   tongue and lips  . Arthritis    "both of my legs and feet" (01/05/2014)  . Erectile dysfunction 04/20/2018  . GI bleed   . Heart murmur 04/20/2018  . Hx of gout   . Hypertension   . Obesity   . OSA on CPAP   . Pure hypercholesterolemia 04/20/2018  . S/P radiation therapy 07/26/2013-09/15/2013   Right tonsil/bilateral neck/ 7000 cGy  . Squamous cell carcinoma of right tonsil ([place]) 06/14/13    Past Surgical History:  Procedure Laterality Date  . COLONOSCOPY Left 04/11/2014   Procedure: COLONOSCOPY;  Surgeon: Arta Silence, MD;  Location: [facility] ENDOSCOPY;  Service: Endoscopy;  Laterality: Left;  . COLONOSCOPY N/A 04/20/2014   Procedure: COLONOSCOPY;  Surgeon: Lear Ng, MD;  Location: [facility] ENDOSCOPY;  Service: Endoscopy;  Laterality: N/A;  . COLONOSCOPY W/ BIOPSIES AND POLYPECTOMY     benign  . DIRECT LARYNGOSCOPY  01/05/2014  . DIRECT LARYNGOSCOPY N/A 01/05/2014   Procedure: DIRECT  LARYNGOSCOPY AND BIOPSY;  Surgeon: [name], MD;  Location: [place];  Service: ENT;  Laterality: N/A;  . KNEE ARTHROSCOPY Right ~ 1994  . LEFT AND RIGHT HEART CATHETERIZATION WITH CORONARY/GRAFT ANGIOGRAM N/A 12/31/2010   Procedure: LEFT AND RIGHT HEART CATHETERIZATION WITH Beatrix Fetters;  Surgeon: Laverda Page, MD;  Location: [facility] CATH LAB;  Service: Cardiovascular;  Laterality: N/A;  . MULTIPLE EXTRACTIONS WITH ALVEOLOPLASTY N/A 07/08/2013   Procedure: Extraction of tooth #'s 22, 27 with alveoloplasty and bilateal mandibular facial exostoses reductions;  Surgeon: Lenn Cal, DDS;  Location: [facility] ORS;  Service: Oral Surgery;  Laterality: N/A;  . MULTIPLE TOOTH EXTRACTIONS  ~ 2008    Social History   Socioeconomic History  . Marital status: Single    Spouse name: Not on file  . Number of children: 0  . Years of education: Not on file  . Highest education level: Not on file  Occupational History  . Not on file  Tobacco Use  . Smoking status: Former Smoker    Packs/day: 2.00    Years: 42.00    Pack years: 84.00    Types: Cigarettes, Cigars    Quit date: 06/17/2013    Years since quitting: 5.7  . Smokeless tobacco: Never Used  . Tobacco comment: "Quit smoking cigarettes in ~  2007"  Substance and Sexual Activity  . Alcohol use: Never  . Drug use: No  . Sexual activity: Not Currently  Other Topics Concern  .  Not on file  Social History Narrative  . Not on file   Social Determinants of Health   Financial Resource Strain:   . Difficulty of Paying Living Expenses: Not on file  Food Insecurity:   . Worried About Charity fundraiser in the Last Year: Not on file  . Ran Out of Food in the Last Year: Not on file  Transportation Needs: No Transportation Needs  . Lack of Transportation (Medical): No  . Lack of Transportation (Non-Medical): No  Physical Activity:   . Days of Exercise per Week: Not on file  . Minutes of Exercise per Session: Not on file  Stress:    . Feeling of Stress : Not on file  Social Connections:   . Frequency of Communication with Friends and Family: Not on file  . Frequency of Social Gatherings with Friends and Family: Not on file  . Attends Religious Services: Not on file  . Active Member of Clubs or Organizations: Not on file  . Attends Archivist Meetings: Not on file  . Marital Status: Not on file  Intimate Partner Violence: Not At Risk  . Fear of Current or Ex-Partner: No  . Emotionally Abused: No  . Physically Abused: No  . Sexually Abused: No    Review of Systems  Constitution: Negative for decreased appetite, malaise/fatigue, weight gain and weight loss.  Eyes: Negative for visual disturbance.  Cardiovascular: Positive for dyspnea on exertion and leg swelling. Negative for chest pain, claudication, orthopnea, palpitations and syncope.  Respiratory: Negative for hemoptysis and wheezing.   Endocrine: Negative for cold intolerance and heat intolerance.  Hematologic/Lymphatic: Does not bruise/bleed easily.  Skin: Negative for nail changes.  Musculoskeletal: Positive for joint pain. Negative for muscle weakness and myalgias.  Gastrointestinal: Negative for abdominal pain, change in bowel habit, nausea and vomiting.  Neurological: Negative for difficulty with concentration, dizziness, focal weakness and headaches.  Psychiatric/Behavioral: Negative for altered mental status and suicidal ideas.  All other systems reviewed and are negative.     Objective:  Blood pressure 138/82, pulse 87, temperature 98.7 F (37.1 C), height 6\' 2"  (1.88 m), weight (!) 348 lb 12.8 oz (158.2 kg), SpO2 94 %. Body mass index is 44.78 kg/m.    Physical Exam  Constitutional: He is oriented to person, place, and time. Vital signs are normal. He appears well-developed and well-nourished.  Morbidly obese  HENT:  Head: Normocephalic and atraumatic.  Neck: Trachea normal. No thyromegaly present.  Cardiovascular: Normal rate,  regular rhythm and intact distal pulses.  Murmur heard.  Early systolic murmur is present with a grade of 1/6 at the upper right sternal border. 2+ pitting edema bilateral lower extremities Trace leg edema  Pulmonary/Chest: Effort normal and breath sounds normal. No accessory muscle usage. No respiratory distress.  Abdominal: Soft. Bowel sounds are normal.  Musculoskeletal:        General: Normal range of motion.     Cervical back: Normal range of motion.  Neurological: He is alert and oriented to person, place, and time.  Skin: Skin is warm and dry.  Vitals reviewed.  Radiology: No results found.  Laboratory examination:    CMP Latest Ref Rng & Units 08/31/2016 08/31/2016 08/30/2016  Glucose 65 - 99 mg/dL 129(H) 154(H) 156(H)  BUN 6 - 20 mg/dL 7 6 5(L)  Creatinine 0.61 - 1.24 mg/dL 0.87 0.98 1.01  Sodium 135 - 145 mmol/L 139 139 139  Potassium 3.5 - 5.1 mmol/L 3.4(L) 3.2(L) 3.4(L)  Chloride 101 -  111 mmol/L 104 103 102  CO2 22 - 32 mmol/L 28 30 29   Calcium 8.9 - 10.3 mg/dL 8.7(L) 8.7(L) 9.5  Total Protein 6.5 - 8.1 g/dL - - 7.1  Total Bilirubin 0.3 - 1.2 mg/dL - - 0.9  Alkaline Phos 38 - 126 U/L - - 145(H)  AST 15 - 41 U/L - - 21  ALT 17 - 63 U/L - - 21   CBC Latest Ref Rng & Units 09/03/2016 09/03/2016 09/03/2016  WBC 4.0 - 10.5 K/uL - - -  Hemoglobin 13.0 - 17.0 g/dL 10.4(L) 9.7(L) 9.9(L)  Hematocrit 39.0 - 52.0 % 30.3(L) 28.1(L) 28.4(L)  Platelets 150 - 400 K/uL - - -   Lipid Panel  No results found for: CHOL, TRIG, HDL, CHOLHDL, VLDL, LDLCALC, LDLDIRECT HEMOGLOBIN A1C No results found for: HGBA1C, MPG TSH Recent Labs    04/16/18 1033  TSH 1.160    PRN Meds:. Medications Discontinued During This Encounter  Medication Reason  . isosorbide-hydrALAZINE (BIDIL) 20-37.5 MG tablet Reorder  . isosorbide-hydrALAZINE (BIDIL) 20-37.5 MG tablet Reorder   Current Meds  Medication Sig  . allopurinol (ZYLOPRIM) 300 MG tablet Take 300 mg by mouth daily as needed.   [name]  aspirin 81 MG tablet Take 81 mg by mouth daily.   . calcium citrate-vitamin D 500-500 MG-UNIT chewable tablet Chew 1 tablet by mouth 2 (two) times daily.  . carvedilol (COREG) 25 MG tablet Take 25 mg by mouth 2 (two) times daily with a meal.  . cetirizine (ZYRTEC) 10 MG tablet Take 10 mg by mouth daily.   . cloNIDine (CATAPRES) 0.1 MG tablet TAKE 1 TABLET(0.1 MG) BY MOUTH TWICE DAILY  . diphenhydrAMINE (BENADRYL) 25 MG tablet Take 1 tablet (25 mg total) by mouth every 6 (six) hours. (Patient taking differently: Take 25 mg by mouth every 6 (six) hours as needed. )  . eplerenone (INSPRA) 50 MG tablet Take 50 mg by mouth daily.  . furosemide (LASIX) 40 MG tablet Take 40 mg by mouth 2 (two) times daily.  [name] gabapentin (NEURONTIN) 300 MG capsule Take 300 mg by mouth 3 (three) times daily.   . hydrocortisone-pramoxine (PROCTOFOAM-HC) rectal foam Place 1 applicator rectally 2 (two) times daily. (Patient taking differently: Place 1 applicator rectally as needed. )  . isosorbide-hydrALAZINE (BIDIL) 20-37.5 MG tablet Take 1 tablet by mouth 3 (three) times daily.  . metFORMIN (GLUCOPHAGE) 500 MG tablet Take 500 mg by mouth 2 (two) times daily with a meal.   . Multiple Vitamin (MULTIVITAMIN WITH MINERALS) TABS tablet Take 1 tablet by mouth daily.  [name] NIFEdipine (ADALAT CC) 90 MG 24 hr tablet Take 90 mg by mouth daily.  [name] omeprazole (PRILOSEC) 20 MG capsule Take 20 mg by mouth daily.   [name] oxyCODONE-acetaminophen (PERCOCET) 10-325 MG per tablet Take 1 tablet by mouth every 6 (six) hours as needed for pain. (Patient taking differently: Take 1 tablet by mouth 4 (four) times daily. )  . Vitamin D, Ergocalciferol, (DRISDOL) 1.25 MG (50000 UT) CAPS capsule Take 50,000 Units by mouth every 7 (seven) days.  . [DISCONTINUED] isosorbide-hydrALAZINE (BIDIL) 20-37.5 MG tablet Take 1 tablet by mouth 3 (three) times daily.  . [DISCONTINUED] isosorbide-hydrALAZINE (BIDIL) 20-37.5 MG tablet Take 1 tablet by mouth 3 (three) times  daily.    Cardiac Studies:   Echocardiogram 07/26/2018: Normal LV systolic function with EF 56%. Left ventricle cavity is normal in size. Moderate concentric hypertrophy of the left ventricle. Normal global wall motion. Doppler evidence of grade I (impaired)  diastolic dysfunction, normal LAP. Calculated EF 56%. Left atrial cavity is mildly dilated. Aneurysmal interatrial septum without 2D or color Doppler evidence of interatrial shunt. No significant valvular abnormalities noted.  IVC is dilated with respiratory variation. This may suggest elevated right heart pressure  Lexiscan Myoview stress test 06/28/2018: 1. Lexiscan stress test was performed. Exercise capacity was not assessed. Stress symptoms included dyspnea, dizziness. Resting blood pressure was 132/90 mmHg and peak effect blood pressure was 138/82 mmHg. The resting and stress electrocardiogram demonstrated sinus tachycardia, left anterior fascicular block, possible old posterior infarct, no resting arrhythmias and normal repolarization. Stress EKG is non diagnostic for ischemia as it is a pharmacologic stress.  2. The overall quality of the study is fair. Left ventricular cavity is noted to be normal on the rest and stress studies. Gated SPECT images reveal normal myocardial thickening and wall motion. The left ventricular ejection fraction was calculated or visually estimated to be 54%. REST and STRESS images demonstrate decreased tracer uptake in the basal inferior and mid inferior segments of the left ventricle, likely due to tissue attenuation artifact (BMI 42). Ischemia in this region cannot be excluded. Recommend clinical correlation.  3. Low risk study.   Coronary Angiogram [12/31/2010]: Heart cath 12/31/10: Noral LVEF. Slow flow in coronary arteries without stenosis. Mild pulmonary HTN.   Assessment:   Chronic diastolic (congestive) heart failure (HCC) - Plan: EKG 12-Lead, isosorbide-hydrALAZINE (BIDIL) 20-37.5 MG  tablet  Essential hypertension - Plan: isosorbide-hydrALAZINE (BIDIL) 20-37.5 MG tablet  Obesity, Class III, BMI 40-49.9 (morbid obesity) ([place])  EKG 03/14/2019: Normal sinus rhythm at the rate of 72 bpm, left axis deviation, left anterior fascicular block. Right bundle branch block. PRWP, cannot exclude anterior infarct old, Nonspecific ST-T abnormality, cannot exclude inferior ischemia. No changes from EKG 08/05/2018  Recommendations:   Patient is doing well without any complaints today. No clinical evidence of decompensated heart failure. Blood pressure remains well controlled; therefore, no changes were made to medications today. He is to have labs performed with PCP in the next few weeks, will request copy be sent to [place] to update our records.   He has been able to maintain stable weight since last seen 6 months ago, but unfortunately, he has not lost any weight. He has been careful with his diet. Encouraged him to continue to find ways to make alterations to his diet to help with weight loss and continue with regular exercise. I will see him back in 6 months for follow up.   Miquel Dunn, MSN, APRN, FNP-C [facility]. [place] Office: [phone] Fax: [phone]

## 2019-05-19 ENCOUNTER — Telehealth: Payer: Self-pay | Admitting: Adult Health

## 2019-05-19 DIAGNOSIS — G4733 Obstructive sleep apnea (adult) (pediatric): Secondary | ICD-10-CM

## 2019-05-19 NOTE — Telephone Encounter (Signed)
Called and spoke with Patient.  Patient stated his current DME is [facility], out of [place].  Patient stated he ordered cpap supplies from his DME, and nothing has been received.  Patient stated he called DME, and was told supplies were being shipped, and he would have to wait for supplies.  Patient is requesting to change DME companies.  Message routed to Dr. Halford Chessman to advise

## 2019-05-23 NOTE — Telephone Encounter (Signed)
Order sent to [facility] and pt made aware °

## 2019-05-23 NOTE — Telephone Encounter (Signed)
Please send order to change DMEs for CPAP supplies.

## 2019-05-30 ENCOUNTER — Telehealth: Payer: Self-pay | Admitting: Adult Health

## 2019-05-30 NOTE — Telephone Encounter (Signed)
Spoke with pt, I advised him that the order was sent to [facility] and received on 05/24/2019. He stated that he has not heard from anyone from [facility]. I sent Melissa a community message.

## 2019-05-30 NOTE — Telephone Encounter (Signed)
Elon Alas sent to Darlina Guys; Jannette Spanner, CMA; Lorenda Hatchet, Leah  Yes we have an order. It was pulled from Epic on 4/5 and sent to Resupply team on 4/5.

## 2019-05-31 NOTE — Telephone Encounter (Signed)
Spoke with pt. He is aware that [facility] has his order and is currently processing it. Pt was appreciative. Advised him to let us know if he still doesn't hear from them.

## 2019-06-06 ENCOUNTER — Telehealth: Payer: Self-pay | Admitting: Adult Health

## 2019-06-06 NOTE — Telephone Encounter (Signed)
Spoke with the pt  He has not heard anything about CPAP supplies ordered 05/23/19  Will forward to [facility] per protocol

## 2019-06-07 NOTE — Telephone Encounter (Signed)
I called [facility] & spoke to [place].  She states she sent message to Resupply Team when she got our order.  She sent them another message this morning and asked them to contact the pt.  I called pt & made him aware [facility] does have his order and they should be calling him.  Told him if he hasn't heard from them in a day or 2 to call us back.  He states ok.  Nothing further needed at this time.

## 2019-06-07 NOTE — Telephone Encounter (Signed)
I will call to check on this.

## 2019-06-30 ENCOUNTER — Other Ambulatory Visit: Payer: Self-pay | Admitting: Cardiology

## 2019-08-04 ENCOUNTER — Telehealth: Payer: Self-pay | Admitting: Adult Health

## 2019-08-04 ENCOUNTER — Other Ambulatory Visit: Payer: Self-pay | Admitting: Cardiology

## 2019-08-05 NOTE — Telephone Encounter (Signed)
Dr. Halford Chessman, please advise if you are okay with [place] placing an order to have pt's DME changed?

## 2019-08-05 NOTE — Telephone Encounter (Signed)
Closed the note by mistake

## 2019-08-05 NOTE — Telephone Encounter (Signed)
Okay to send order to switch DMEs. 

## 2019-08-05 NOTE — Telephone Encounter (Signed)
We will need a new referral to send to a homecare company

## 2019-08-05 NOTE — Telephone Encounter (Signed)
Patient no longer wants to use [facility] for his CPAP supplies.  He just ordered his supplies from them, but his next order he wants to go to [facility].  I advised him to call us when he needs [place] to place a new order so we can send it to [facility] instead.  He verbalized understanding.  Nothing further needed.

## 2019-09-13 ENCOUNTER — Other Ambulatory Visit: Payer: Self-pay

## 2019-09-13 ENCOUNTER — Ambulatory Visit: Payer: Medicare Other | Admitting: Cardiology

## 2019-09-13 ENCOUNTER — Other Ambulatory Visit: Payer: Self-pay | Admitting: Cardiology

## 2019-09-13 ENCOUNTER — Encounter: Payer: Self-pay | Admitting: Cardiology

## 2019-09-13 ENCOUNTER — Ambulatory Visit: Payer: Medicare HMO | Admitting: Cardiology

## 2019-09-13 VITALS — BP 114/74 | HR 87 | Resp 15 | Ht 74.0 in | Wt 333.4 lb

## 2019-09-13 DIAGNOSIS — I5032 Chronic diastolic (congestive) heart failure: Secondary | ICD-10-CM

## 2019-09-13 DIAGNOSIS — G4733 Obstructive sleep apnea (adult) (pediatric): Secondary | ICD-10-CM

## 2019-09-13 DIAGNOSIS — I10 Essential (primary) hypertension: Secondary | ICD-10-CM

## 2019-09-13 DIAGNOSIS — E1165 Type 2 diabetes mellitus with hyperglycemia: Secondary | ICD-10-CM

## 2019-09-13 DIAGNOSIS — Z87891 Personal history of nicotine dependence: Secondary | ICD-10-CM

## 2019-09-13 NOTE — Patient Instructions (Signed)
Week #1: Clonidine 0.1 mg p.o. daily Week #2: Clonidine 0.1 mg p.o. every other day Week #3: Start clonidine  Fasting blood work prior to the next office visit.  Bring medication bottles with you at the next office visit

## 2019-09-13 NOTE — Progress Notes (Signed)
[name] Date of Birth: [birth year] MRN: [id number] Primary Care Provider:Reese, Zadie Cleverly, MD Former Cardiology Providers: Jeri Lager, APRN, FNP-C Primary Cardiologist: Rex Kras, DO, [facility] (established care 09/13/2019)  Date: 09/13/19 Last Visit: 03/14/2019  Chief Complaint  Patient presents with   Follow-up    6 month   Hypertension   Diastolic CHF    HPI  Melvin Hudson is a 64 y.o.  male who presents to the office with a chief complaint of "Congestive heart failure management." Patient's past medical history and cardiovascular risk factors include: Hypertension, diabetes mellitus, former tobacco use, history of throat cancer diagnosis in 2015, no significant epicardial coronary disease per angiography in 2012, chronic heart failure with preserved EF, OSA on CPAP, advanced age.  Patient was formally under the care of Miquel Dunn and I am seeing him for the first time for the above-mentioned chief complaint and to reestablish care.  Since last office visit patient has not been hospitalized for cardiovascular symptoms.  Patient denies any prior hospitalizations for congestive heart failure.  He denies any chest pain at rest or with effort related activities.  He does not participate in any structured exercise program or daily routine.  Patient has lost approximately 15 pounds as last office visit for which he is congratulated for at this encounter.  Patient is compliant with his medications but failed to bring his bottles in order and appropriate med reconciliation.  Patient also wonders why he is not on a statin medication given his history of congestive heart failure and diabetes mellitus.  I do not have any blood work to review with him at today's office visit.  Denies prior history of myocardial infarction, deep venous thrombosis, pulmonary embolism, stroke, transient ischemic attack.  FUNCTIONAL STATUS: No structured exercise program or daily routine.     ALLERGIES: Allergies  Allergen Reactions   Azilsartan Swelling    Avoid ARB and ACEI per MD   Advil [Ibuprofen]     Feels "jittery"    MEDICATION LIST PRIOR TO VISIT: Current Outpatient Medications on File Prior to Visit  Medication Sig Dispense Refill   allopurinol (ZYLOPRIM) 300 MG tablet Take 300 mg by mouth daily as needed.      aspirin 81 MG tablet Take 81 mg by mouth daily.      Blood Glucose Monitoring Suppl (ONETOUCH VERIO FLEX SYSTEM) w/Device KIT USE TO CHECK BLOOD SUGAR TWICE DAILY     Blood Glucose Monitoring Suppl (ONETOUCH VERIO FLEX SYSTEM) w/Device KIT USE TO CHECK BLOOD SUGAR TWICE DAILY     calcium citrate-vitamin D 500-500 MG-UNIT chewable tablet Chew 1 tablet by mouth 2 (two) times daily.     carvedilol (COREG) 25 MG tablet Take 25 mg by mouth 2 (two) times daily with a meal.     cetirizine (ZYRTEC) 10 MG tablet Take 10 mg by mouth daily.   2   cloNIDine (CATAPRES) 0.1 MG tablet TAKE 1 TABLET(0.1 MG) BY MOUTH TWICE DAILY 180 tablet 0   diphenhydrAMINE (BENADRYL) 25 MG tablet Take 1 tablet (25 mg total) by mouth every 6 (six) hours. (Patient taking differently: Take 25 mg by mouth every 6 (six) hours as needed. ) 20 tablet 0   eplerenone (INSPRA) 50 MG tablet Take 50 mg by mouth daily.  2   furosemide (LASIX) 40 MG tablet Take 40 mg by mouth 2 (two) times daily.  2   gabapentin (NEURONTIN) 300 MG capsule Take 300 mg by mouth 3 (three) times daily.   2  hydrocortisone-pramoxine (PROCTOFOAM-HC) rectal foam Place 1 applicator rectally 2 (two) times daily. (Patient taking differently: Place 1 applicator rectally as needed. ) 10 g 0   isosorbide-hydrALAZINE (BIDIL) 20-37.5 MG tablet Take 1 tablet by mouth 3 (three) times daily. 270 tablet 3   metFORMIN (GLUCOPHAGE) 500 MG tablet Take 500 mg by mouth 2 (two) times daily with a meal.      Multiple Vitamin (MULTIVITAMIN WITH MINERALS) TABS tablet Take 1 tablet by mouth daily.     NIFEdipine (ADALAT CC)  90 MG 24 hr tablet Take 90 mg by mouth daily.     omeprazole (PRILOSEC) 20 MG capsule Take 20 mg by mouth daily.   4   ONETOUCH VERIO test strip USE TO CHECK BLOOD SUGAR TWICE DAILY     oxyCODONE-acetaminophen (PERCOCET) 10-325 MG per tablet Take 1 tablet by mouth every 6 (six) hours as needed for pain. (Patient taking differently: Take 1 tablet by mouth 4 (four) times daily. ) 120 tablet 0   Vitamin D, Ergocalciferol, (DRISDOL) 1.25 MG (50000 UT) CAPS capsule Take 50,000 Units by mouth. Every Friday.     No current facility-administered medications on file prior to visit.    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    PAST SURGICAL HISTORY: Past Surgical History:  Procedure Laterality Date   COLONOSCOPY Left 04/11/2014   Procedure: COLONOSCOPY;  Surgeon: Arta Silence, MD;  Location: [facility] ENDOSCOPY;  Service: Endoscopy;  Laterality: Left;   COLONOSCOPY N/A 04/20/2014   Procedure: COLONOSCOPY;  Surgeon: Lear Ng, MD;  Location: [facility] ENDOSCOPY;  Service: Endoscopy;  Laterality: N/A;   COLONOSCOPY W/ BIOPSIES AND POLYPECTOMY     benign   DIRECT LARYNGOSCOPY  01/05/2014   DIRECT LARYNGOSCOPY N/A 01/05/2014   Procedure: DIRECT LARYNGOSCOPY AND BIOPSY;  Surgeon: [name], MD;  Location: [place];  Service: ENT;  Laterality: N/A;   KNEE ARTHROSCOPY Right ~ [place] CORONARY/GRAFT ANGIOGRAM N/A 12/31/2010   Procedure:  LEFT AND RIGHT HEART CATHETERIZATION WITH Beatrix Fetters;  Surgeon: Laverda Page, MD;  Location: [facility] CATH LAB;  Service: Cardiovascular;  Laterality: N/A;   MULTIPLE EXTRACTIONS WITH ALVEOLOPLASTY N/A 07/08/2013   Procedure: Extraction of tooth #'s 22, 27 with alveoloplasty and bilateal mandibular facial exostoses reductions;  Surgeon: Lenn Cal, DDS;  Location: [facility] ORS;  Service: Oral Surgery;  Laterality: N/A;   MULTIPLE TOOTH EXTRACTIONS  ~ 2008    FAMILY HISTORY: The patient's family history includes Aneurysm in his father; Cancer in his mother and sister; Diabetes in his father; Hypertension in his mother; Stroke in his mother.   SOCIAL HISTORY:  The patient  reports that he quit smoking about 6 years ago. His smoking use included cigarettes and cigars. He has a 84.00 pack-year smoking history. He has never used smokeless tobacco. He reports that he does not drink alcohol and does  not use drugs.  Review of Systems  Constitutional: Negative for chills, fever and weight gain.  HENT: Negative for hoarse voice and nosebleeds.   Eyes: Negative for discharge, double vision and pain.  Cardiovascular: Positive for leg swelling. Negative for chest pain, claudication, dyspnea on exertion, near-syncope, orthopnea, palpitations, paroxysmal nocturnal dyspnea and syncope.  Respiratory: Negative for hemoptysis and shortness of breath.   Musculoskeletal: Negative for muscle cramps and myalgias.  Gastrointestinal: Negative for abdominal pain, constipation, diarrhea, hematemesis, hematochezia, melena, nausea and vomiting.  Neurological: Negative for dizziness and light-headedness.   PHYSICAL EXAM: Vitals with BMI 09/13/2019 03/14/2019 03/14/2019  Height 6' 2"  - 6' 2"   Weight 333 lbs 6 oz - 348 lbs 13 oz  BMI 16.07 - 37.10  Systolic 626 948 546  Diastolic 74 82 92  Pulse 87 - 87    CONSTITUTIONAL: Well-developed and well-nourished. No acute distress.  SKIN: Skin is warm and dry.  No rash noted. No cyanosis. No pallor. No jaundice HEAD: Normocephalic and atraumatic.  EYES: No scleral icterus, arcus senilis MOUTH/THROAT: Moist oral membranes.  NECK: No JVD present. No thyromegaly noted. No carotid bruits  LYMPHATIC: No visible cervical adenopathy.  CHEST Normal respiratory effort. No intercostal retractions  LUNGS: Clear to auscultation bilaterally. No stridor. No wheezes. No rales.  CARDIOVASCULAR: Regular rate and rhythm, positive S1-S2, no murmurs rubs or gallops appreciated. ABDOMINAL: Obese, soft, nontender, nondistended, positive bowel sounds all 4 quadrants. No apparent ascites.  EXTREMITIES: +2 bilateral peripheral edema.  HEMATOLOGIC: No significant bruising NEUROLOGIC: Oriented to person, place, and time. Nonfocal. Normal muscle tone.  PSYCHIATRIC: Normal mood and affect. Normal behavior. Cooperative  CARDIAC DATABASE: EKG: 09/13/2019: Normal sinus rhythm, 86 bpm, first-degree AV block, left axis deviation, nonspecific T wave abnormalities in the anteroseptal leads.  Without underlying injury pattern.   Echocardiogram: 07/26/2018: LVEF 56%, moderate LVH, grade 1 diastolic impairment, normal left atrial pressure, mildly dilated left atrium.  Stress Testing:  Lexiscan Myoview stress test 06/28/2018: 1. Lexiscan stress test was performed. Exercise capacity was not assessed. Stress symptoms included dyspnea, dizziness. Resting blood pressure was 132/90 mmHg and peak effect blood pressure was 138/82 mmHg. The resting and stress electrocardiogram demonstrated sinus tachycardia, left anterior fascicular block, possible old posterior infarct, no resting arrhythmias and normal repolarization. Stress EKG is non diagnostic for ischemia as it is a pharmacologic stress.  2. The overall quality of the study is fair. Left ventricular cavity is noted to be normal on the rest and stress studies. Gated SPECT images reveal normal myocardial thickening and wall motion. The  left ventricular ejection fraction was calculated or visually estimated to be 54%. REST and STRESS images demonstrate decreased tracer uptake in the basal inferior and mid inferior segments of the left ventricle, likely due to tissue attenuation artifact (BMI 42). Ischemia in this region cannot be excluded. Recommend clinical correlation.  3. Low risk study.  Heart Catheterization: Coronary Angiogram [12/31/2010]: Heart cath 12/31/10: Noral LVEF. Slow flow in coronary arteries without stenosis. Mild pulmonary HTN.  LABORATORY DATA: CBC Latest Ref Rng & Units 09/03/2016 09/03/2016 09/03/2016  WBC 4.0 - 10.5 K/uL - - -  Hemoglobin 13.0 - 17.0 g/dL 10.4(L) 9.7(L) 9.9(L)  Hematocrit 39 - 52 % 30.3(L) 28.1(L) 28.4(L)  Platelets 150 - 400 K/uL - - -    CMP Latest Ref Rng & Units 08/31/2016 08/31/2016 08/30/2016  Glucose 65 - 99 mg/dL 129(H) 154(H) 156(H)  BUN 6 - 20 mg/dL 7 6 5(L)  Creatinine 0.61 - 1.24 mg/dL  0.87 0.98 1.01  Sodium 135 - 145 mmol/L 139 139 139  Potassium 3.5 - 5.1 mmol/L 3.4(L) 3.2(L) 3.4(L)  Chloride 101 - 111 mmol/L 104 103 102  CO2 22 - 32 mmol/L 28 30 29   Calcium 8.9 - 10.3 mg/dL 8.7(L) 8.7(L) 9.5  Total Protein 6.5 - 8.1 g/dL - - 7.1  Total Bilirubin 0.3 - 1.2 mg/dL - - 0.9  Alkaline Phos 38 - 126 U/L - - 145(H)  AST 15 - 41 U/L - - 21  ALT 17 - 63 U/L - - 21    Lipid Panel  No results found for: CHOL, TRIG, HDL, CHOLHDL, VLDL, LDLCALC, LDLDIRECT, LABVLDL  No results found for: HGBA1C No components found for: NTPROBNP Lab Results  Component Value Date   TSH 1.160 04/16/2018   TSH 1.588 04/10/2017   TSH 1.089 04/11/2016    Cardiac Panel (last 3 results) No results for input(s): CKTOTAL, CKMB, TROPONINIHS, RELINDX in the last 72 hours.  IMPRESSION:    ICD-10-CM   1. Chronic diastolic (congestive) heart failure (HCC)  I50.32 EKG [id number]    Basic metabolic panel    Magnesium    Pro b natriuretic peptide (BNP)    LDL cholesterol, direct  2. Essential  hypertension  I10   3. Former smoker  Z87.891   4. Type 2 diabetes mellitus with hyperglycemia, without long-term current use of insulin (HCC)  E11.65 Lipid Panel With LDL/HDL Ratio  5. OSA on CPAP  G47.33    Z99.89      RECOMMENDATIONS: Melvin Hudson is a 64 y.o. male whose past medical history and cardiovascular risk factors include: Hypertension, diabetes mellitus, former tobacco use, history of throat cancer diagnosis in 2015, no significant epicardial coronary disease per angiography in 2012, chronic heart failure with preserved EF, OSA on CPAP, advanced age.  Chronic heart failure with preserved EF, stage B, NYHA class II/III:  Medications reconciled as per the patient's memory.  Patient is asked to bring his medication bottles at the next visit.  It appears that he is also on a medication called Zaroxolyn but difficult to verify this.  Patient is educated on the importance of a low-salt diet, strict I's and O's, and daily weights.  Blood pressure log reviewed.  Recommend uptitrating his guideline directed medical therapy.  Patient is on clonidine and we talked about weaning him off of that over the next 3 weeks.  Instructions provided.  Check BMP, magnesium, BNP, and fasting lipid profile.  Educated on increasing physical activity as tolerated with a goal of 30 minutes a day 5 days a week.  Former smoker: Educated on the importance of continued smoking cessation.  Non-insulin-dependent diabetes mellitus: Currently managed by primary team.  Do not have a recent hemoglobin A1c to evaluate therapy.  Physical examination findings show arcus senilis, he also has non-insulin-dependent diabetes mellitus would recommend statin therapy.  We will check fasting lipid profile prior to initiating statin therapy.  Patient's questions and concerns were addressed to his satisfaction.  Total time spent: 40 minutes.  FINAL MEDICATION LIST END OF ENCOUNTER: No orders of the defined types  were placed in this encounter.   There are no discontinued medications.   Current Outpatient Medications:    allopurinol (ZYLOPRIM) 300 MG tablet, Take 300 mg by mouth daily as needed. , Disp: , Rfl:    aspirin 81 MG tablet, Take 81 mg by mouth daily. , Disp: , Rfl:    Blood Glucose Monitoring Suppl (Adrian)  w/Device KIT, USE TO CHECK BLOOD SUGAR TWICE DAILY, Disp: , Rfl:    Blood Glucose Monitoring Suppl (ONETOUCH VERIO FLEX SYSTEM) w/Device KIT, USE TO CHECK BLOOD SUGAR TWICE DAILY, Disp: , Rfl:    calcium citrate-vitamin D 500-500 MG-UNIT chewable tablet, Chew 1 tablet by mouth 2 (two) times daily., Disp: , Rfl:    carvedilol (COREG) 25 MG tablet, Take 25 mg by mouth 2 (two) times daily with a meal., Disp: , Rfl:    cetirizine (ZYRTEC) 10 MG tablet, Take 10 mg by mouth daily. , Disp: , Rfl: 2   cloNIDine (CATAPRES) 0.1 MG tablet, TAKE 1 TABLET(0.1 MG) BY MOUTH TWICE DAILY, Disp: 180 tablet, Rfl: 0   diphenhydrAMINE (BENADRYL) 25 MG tablet, Take 1 tablet (25 mg total) by mouth every 6 (six) hours. (Patient taking differently: Take 25 mg by mouth every 6 (six) hours as needed. ), Disp: 20 tablet, Rfl: 0   eplerenone (INSPRA) 50 MG tablet, Take 50 mg by mouth daily., Disp: , Rfl: 2   furosemide (LASIX) 40 MG tablet, Take 40 mg by mouth 2 (two) times daily., Disp: , Rfl: 2   gabapentin (NEURONTIN) 300 MG capsule, Take 300 mg by mouth 3 (three) times daily. , Disp: , Rfl: 2   hydrocortisone-pramoxine (PROCTOFOAM-HC) rectal foam, Place 1 applicator rectally 2 (two) times daily. (Patient taking differently: Place 1 applicator rectally as needed. ), Disp: 10 g, Rfl: 0   isosorbide-hydrALAZINE (BIDIL) 20-37.5 MG tablet, Take 1 tablet by mouth 3 (three) times daily., Disp: 270 tablet, Rfl: 3   metFORMIN (GLUCOPHAGE) 500 MG tablet, Take 500 mg by mouth 2 (two) times daily with a meal. , Disp: , Rfl:    Multiple Vitamin (MULTIVITAMIN WITH MINERALS) TABS tablet, Take 1  tablet by mouth daily., Disp: , Rfl:    NIFEdipine (ADALAT CC) 90 MG 24 hr tablet, Take 90 mg by mouth daily., Disp: , Rfl:    omeprazole (PRILOSEC) 20 MG capsule, Take 20 mg by mouth daily. , Disp: , Rfl: 4   ONETOUCH VERIO test strip, USE TO CHECK BLOOD SUGAR TWICE DAILY, Disp: , Rfl:    oxyCODONE-acetaminophen (PERCOCET) 10-325 MG per tablet, Take 1 tablet by mouth every 6 (six) hours as needed for pain. (Patient taking differently: Take 1 tablet by mouth 4 (four) times daily. ), Disp: 120 tablet, Rfl: 0   Vitamin D, Ergocalciferol, (DRISDOL) 1.25 MG (50000 UT) CAPS capsule, Take 50,000 Units by mouth. Every Friday., Disp: , Rfl:   Orders Placed This Encounter  Procedures   Basic metabolic panel   Lipid Panel With LDL/HDL Ratio   Magnesium   Pro b natriuretic peptide (BNP)   LDL cholesterol, direct   EKG 12-Lead   --Continue cardiac medications as reconciled in final medication list. --Return in about 3 weeks (around 10/04/2019) for heart failure management.. Or sooner if needed. --Continue follow-up with your primary care physician regarding the management of your other chronic comorbid conditions.  Patient's questions and concerns were addressed to his satisfaction. He voices understanding of the instructions provided during this encounter.   During this visit I reviewed and updated: Tobacco history   allergies  medication reconciliation   medical history   surgical history   family history   social history.  This note was created using a voice recognition software as a result there may be grammatical errors inadvertently enclosed that do not reflect the nature of this encounter. Every attempt is made to correct such errors.  Madge Therrien [place],  DO, [facility]  Pager: [phone] Office: [phone]

## 2019-09-15 LAB — BASIC METABOLIC PANEL
BUN/Creatinine Ratio: 12 (ref 10–24)
BUN: 11 mg/dL (ref 8–27)
CO2: 26 mmol/L (ref 20–29)
Calcium: 9.4 mg/dL (ref 8.6–10.2)
Chloride: 101 mmol/L (ref 96–106)
Creatinine, Ser: 0.95 mg/dL (ref 0.76–1.27)
GFR calc Af Amer: 97 mL/min/{1.73_m2} (ref >59)
GFR calc non Af Amer: 84 mL/min/{1.73_m2} (ref >59)
Glucose: 108 mg/dL — ABNORMAL HIGH (ref 65–99)
Potassium: 4.1 mmol/L (ref 3.5–5.2)
Sodium: 140 mmol/L (ref 134–144)

## 2019-09-15 LAB — PRO B NATRIURETIC PEPTIDE: NT-Pro BNP: 32 pg/mL (ref 0–376)

## 2019-09-15 LAB — LIPID PANEL WITH LDL/HDL RATIO
Cholesterol, Total: 138 mg/dL (ref 100–199)
HDL: 38 mg/dL — ABNORMAL LOW (ref >39)
LDL Chol Calc (NIH): 85 mg/dL (ref 0–99)
LDL/HDL Ratio: 2.2 ratio (ref 0.0–3.6)
Triglycerides: 76 mg/dL (ref 0–149)
VLDL Cholesterol Cal: 15 mg/dL (ref 5–40)

## 2019-09-15 LAB — LDL CHOLESTEROL, DIRECT: LDL Direct: 86 mg/dL (ref 0–99)

## 2019-09-15 LAB — MAGNESIUM: Magnesium: 2 mg/dL (ref 1.6–2.3)

## 2019-09-21 ENCOUNTER — Other Ambulatory Visit: Payer: Self-pay

## 2019-09-21 DIAGNOSIS — E1165 Type 2 diabetes mellitus with hyperglycemia: Secondary | ICD-10-CM

## 2019-09-21 NOTE — Progress Notes (Signed)
Patient is aware of lab results. Patient is agreeable to start cholesterol medication. Would you like for me to send in the Lipitor as well as add the Lipid profile. Please advise

## 2019-09-21 NOTE — Progress Notes (Signed)
Done

## 2019-09-21 NOTE — Progress Notes (Signed)
Please send in the prescription for Lipitor 40 mg p.o. nightly. order a fasting lipid profile in 6 weeks to reevaluate therapy.

## 2019-09-22 ENCOUNTER — Other Ambulatory Visit: Payer: Self-pay

## 2019-09-22 MED ORDER — ATORVASTATIN CALCIUM 40 MG PO TABS: 40.0000 mg | ORAL_TABLET | Freq: Every day | ORAL | 6 refills | Status: DC

## 2019-10-02 ENCOUNTER — Other Ambulatory Visit: Payer: Self-pay | Admitting: Cardiology

## 2019-10-02 DIAGNOSIS — I5032 Chronic diastolic (congestive) heart failure: Secondary | ICD-10-CM

## 2019-10-02 DIAGNOSIS — I10 Essential (primary) hypertension: Secondary | ICD-10-CM

## 2019-10-06 ENCOUNTER — Ambulatory Visit: Payer: Medicare Other | Admitting: Cardiology

## 2019-10-07 ENCOUNTER — Other Ambulatory Visit: Payer: Self-pay

## 2019-10-07 ENCOUNTER — Ambulatory Visit: Payer: Medicare HMO | Admitting: Cardiology

## 2019-10-07 ENCOUNTER — Encounter: Payer: Self-pay | Admitting: Cardiology

## 2019-10-07 VITALS — BP 128/75 | HR 67 | Resp 16 | Ht 74.0 in | Wt 328.0 lb

## 2019-10-07 DIAGNOSIS — G4733 Obstructive sleep apnea (adult) (pediatric): Secondary | ICD-10-CM

## 2019-10-07 DIAGNOSIS — I10 Essential (primary) hypertension: Secondary | ICD-10-CM

## 2019-10-07 DIAGNOSIS — Z87891 Personal history of nicotine dependence: Secondary | ICD-10-CM

## 2019-10-07 DIAGNOSIS — I5032 Chronic diastolic (congestive) heart failure: Secondary | ICD-10-CM

## 2019-10-07 DIAGNOSIS — Z712 Person consulting for explanation of examination or test findings: Secondary | ICD-10-CM

## 2019-10-07 DIAGNOSIS — E1165 Type 2 diabetes mellitus with hyperglycemia: Secondary | ICD-10-CM

## 2019-10-07 MED ORDER — ATORVASTATIN CALCIUM 40 MG PO TABS: 40.0000 mg | ORAL_TABLET | Freq: Every day | ORAL | 1 refills | Status: AC

## 2019-10-07 NOTE — Progress Notes (Addendum)
Melvin Hudson Date of Birth: Apr 06, 1955 MRN: [id number] Primary Care Provider:Reese, Zadie Cleverly, MD Former Cardiology Providers: Jeri Lager, APRN, FNP-C Primary Cardiologist: Rex Kras, DO, [facility] (established care 09/13/2019)  Date: 10/07/19 Last Office Visit: 09/13/2019  Chief Complaint  Patient presents with   Congestive Heart Failure   Hypertension   Follow-up    3 week    HPI  Melvin Hudson is a 64 y.o.  male who presents to the office with a chief complaint of "Congestive heart failure, BP and cholesterol management." Patient's past medical history and cardiovascular risk factors include: Hypertension, diabetes mellitus, former tobacco use, history of throat cancer diagnosis in 2015, no significant epicardial coronary disease per angiography in 2012, chronic heart failure with preserved EF, OSA on CPAP, advanced age.  Patient was formally under the care of Miquel Dunn and I started seeing him back in July 2021 for management of congestive heart, blood pressure and cholesterol.  Since last office visit patient has not been hospitalized for cardiovascular symptoms.  Patient denies any prior hospitalizations for congestive heart failure.  He denies any chest pain at rest or with effort related activities.  He does not participate in any structured exercise program or daily routine.  Patient has lost another 5 pounds since last office visit.    Since last office visit patient is essentially weaned off of clonidine and his blood pressures remained stable.  Patient had blood work done since last office visit and his LDL was not at goal and therefore was started on Lipitor.  He tolerated the medication well without any side effects or intolerances.    Most recent blood work from July 2021 reviewed with the patient at today's office visit.  Denies prior history of myocardial infarction, deep venous thrombosis, pulmonary embolism, stroke, transient ischemic attack.  FUNCTIONAL STATUS: No  structured exercise program or daily routine.    ALLERGIES: Allergies  Allergen Reactions   Azilsartan Swelling    Avoid ARB and ACEI per MD   Advil [Ibuprofen]     Feels "jittery"    MEDICATION LIST PRIOR TO VISIT: Current Outpatient Medications on File Prior to Visit  Medication Sig Dispense Refill   allopurinol (ZYLOPRIM) 300 MG tablet Take 300 mg by mouth daily as needed.      aspirin 81 MG tablet Take 81 mg by mouth daily.      BIDIL 20-37.5 MG tablet TAKE 1 TABLET BY MOUTH THREE TIMES DAILY 90 tablet 0   Blood Glucose Monitoring Suppl (ONETOUCH VERIO FLEX SYSTEM) w/Device KIT USE TO CHECK BLOOD SUGAR TWICE DAILY     Blood Glucose Monitoring Suppl (ONETOUCH VERIO FLEX SYSTEM) w/Device KIT USE TO CHECK BLOOD SUGAR TWICE DAILY     calcium citrate-vitamin D 500-500 MG-UNIT chewable tablet Chew 1 tablet by mouth daily. 1/2 623m at bedtime     carvedilol (COREG) 25 MG tablet Take 25 mg by mouth 2 (two) times daily with a meal.     cetirizine (ZYRTEC) 10 MG tablet Take 10 mg by mouth daily.   2   enalapril (VASOTEC) 2.5 MG tablet Take 2.5 mg by mouth daily.     eplerenone (INSPRA) 50 MG tablet Take 50 mg by mouth daily.  2   furosemide (LASIX) 40 MG tablet Take 40 mg by mouth 2 (two) times daily.  2   gabapentin (NEURONTIN) 300 MG capsule Take 300 mg by mouth 3 (three) times daily.   2   hydrocortisone-pramoxine (PROCTOFOAM-HC) rectal foam Place 1 applicator rectally 2 (two)  times daily. (Patient taking differently: Place 1 applicator rectally as needed. ) 10 g 0   metFORMIN (GLUCOPHAGE) 500 MG tablet Take 500 mg by mouth 2 (two) times daily with a meal.      NIFEdipine (ADALAT CC) 90 MG 24 hr tablet Take 90 mg by mouth daily.     omeprazole (PRILOSEC) 20 MG capsule Take 20 mg by mouth daily.   4   ONETOUCH VERIO test strip USE TO CHECK BLOOD SUGAR TWICE DAILY     oxyCODONE-acetaminophen (PERCOCET) 10-325 MG per tablet Take 1 tablet by mouth every 6 (six) hours as  needed for pain. (Patient taking differently: Take 1 tablet by mouth 4 (four) times daily. ) 120 tablet 0   Vitamin D, Ergocalciferol, (DRISDOL) 1.25 MG (50000 UT) CAPS capsule Take 50,000 Units by mouth. Every Friday.     diphenhydrAMINE (BENADRYL) 25 MG tablet Take 1 tablet (25 mg total) by mouth every 6 (six) hours. (Patient not taking: Reported on 10/07/2019) 20 tablet 0   No current facility-administered medications on file prior to visit.    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    PAST SURGICAL HISTORY: Past Surgical History:  Procedure Laterality Date   COLONOSCOPY Left 04/11/2014   Procedure: COLONOSCOPY;  Surgeon: Arta Silence, MD;  Location: [facility] ENDOSCOPY;  Service: Endoscopy;  Laterality: Left;   COLONOSCOPY N/A 04/20/2014   Procedure: COLONOSCOPY;  Surgeon: Lear Ng, MD;  Location: [facility] ENDOSCOPY;  Service: Endoscopy;  Laterality: N/A;   COLONOSCOPY W/ BIOPSIES AND POLYPECTOMY     benign   DIRECT LARYNGOSCOPY  01/05/2014   DIRECT LARYNGOSCOPY N/A 01/05/2014   Procedure: DIRECT LARYNGOSCOPY AND BIOPSY;  Surgeon: [name], MD;  Location: [place];  Service: ENT;  Laterality: N/A;   KNEE ARTHROSCOPY Right ~ [place] CORONARY/GRAFT ANGIOGRAM N/A 12/31/2010   Procedure: LEFT AND RIGHT HEART CATHETERIZATION WITH Beatrix Fetters;  Surgeon: Laverda Page, MD;  Location: [facility] CATH LAB;  Service:  Cardiovascular;  Laterality: N/A;   MULTIPLE EXTRACTIONS WITH ALVEOLOPLASTY N/A 07/08/2013   Procedure: Extraction of tooth #'s 22, 27 with alveoloplasty and bilateal mandibular facial exostoses reductions;  Surgeon: Lenn Cal, DDS;  Location: [facility] ORS;  Service: Oral Surgery;  Laterality: N/A;   MULTIPLE TOOTH EXTRACTIONS  ~ 2008    FAMILY HISTORY: The patient's family history includes Aneurysm in his father; Cancer in his mother and sister; Diabetes in his father; Hypertension in his mother; Stroke in his mother.   SOCIAL HISTORY:  The patient  reports that he quit smoking about 6 years ago. His smoking use included cigarettes and cigars. He has a 84.00 pack-year smoking history. He has never used smokeless tobacco. He reports that he does not drink alcohol and does not use drugs.  Review of Systems  Constitutional: Negative for chills, fever and weight gain.  HENT:  Negative for hoarse voice and nosebleeds.   Eyes: Negative for discharge, double vision and pain.  Cardiovascular: Positive for leg swelling. Negative for chest pain, claudication, dyspnea on exertion, near-syncope, orthopnea, palpitations, paroxysmal nocturnal dyspnea and syncope.  Respiratory: Negative for hemoptysis and shortness of breath.   Musculoskeletal: Negative for muscle cramps and myalgias.  Gastrointestinal: Negative for abdominal pain, constipation, diarrhea, hematemesis, hematochezia, melena, nausea and vomiting.  Neurological: Negative for dizziness and light-headedness.   PHYSICAL EXAM: Vitals with BMI 10/07/2019 09/13/2019 03/14/2019  Height 6' 2"  6' 2"  -  Weight 328 lbs 333 lbs 6 oz -  BMI 60.10 93.23 -  Systolic 557 322 025  Diastolic 75 74 82  Pulse 67 87 -    CONSTITUTIONAL: Well-developed and well-nourished. No acute distress.  SKIN: Skin is warm and dry. No rash noted. No cyanosis. No pallor. No jaundice HEAD: Normocephalic and atraumatic.  EYES: No scleral icterus, arcus  senilis MOUTH/THROAT: Moist oral membranes.  NECK: No JVD present. No thyromegaly noted. No carotid bruits  LYMPHATIC: No visible cervical adenopathy.  CHEST Normal respiratory effort. No intercostal retractions  LUNGS: Clear to auscultation bilaterally. No stridor. No wheezes. No rales.  CARDIOVASCULAR: Regular rate and rhythm, positive S1-S2, no murmurs rubs or gallops appreciated. ABDOMINAL: Obese, soft, nontender, nondistended, positive bowel sounds all 4 quadrants. No apparent ascites.  EXTREMITIES: +2 bilateral peripheral edema.  HEMATOLOGIC: No significant bruising NEUROLOGIC: Oriented to person, place, and time. Nonfocal. Normal muscle tone.  PSYCHIATRIC: Normal mood and affect. Normal behavior. Cooperative  CARDIAC DATABASE: EKG: 09/13/2019: Normal sinus rhythm, 86 bpm, first-degree AV block, left axis deviation, nonspecific T wave abnormalities in the anteroseptal leads.  Without underlying injury pattern.   Echocardiogram: 07/26/2018: LVEF 56%, moderate LVH, grade 1 diastolic impairment, normal left atrial pressure, mildly dilated left atrium.  Stress Testing:  Lexiscan Myoview stress test 06/28/2018: 1. Lexiscan stress test was performed. Exercise capacity was not assessed. Stress symptoms included dyspnea, dizziness. Resting blood pressure was 132/90 mmHg and peak effect blood pressure was 138/82 mmHg. The resting and stress electrocardiogram demonstrated sinus tachycardia, left anterior fascicular block, possible old posterior infarct, no resting arrhythmias and normal repolarization. Stress EKG is non diagnostic for ischemia as it is a pharmacologic stress.  2. The overall quality of the study is fair. Left ventricular cavity is noted to be normal on the rest and stress studies. Gated SPECT images reveal normal myocardial thickening and wall motion. The left ventricular ejection fraction was calculated or visually estimated to be 54%. REST and STRESS images demonstrate  decreased tracer uptake in the basal inferior and mid inferior segments of the left ventricle, likely due to tissue attenuation artifact (BMI 42). Ischemia in this region cannot be excluded. Recommend clinical correlation.  3. Low risk study.  Heart Catheterization: Coronary Angiogram [12/31/2010]: Heart cath 12/31/10: Noral LVEF. Slow flow in coronary arteries without stenosis. Mild pulmonary HTN.  LABORATORY DATA: CBC Latest Ref Rng & Units 09/03/2016 09/03/2016 09/03/2016  WBC 4.0 - 10.5 K/uL - - -  Hemoglobin 13.0 - 17.0 g/dL 10.4(L) 9.7(L) 9.9(L)  Hematocrit 39 - 52 % 30.3(L) 28.1(L) 28.4(L)  Platelets 150 - 400 K/uL - - -    CMP Latest Ref Rng & Units 09/14/2019 08/31/2016 08/31/2016  Glucose 65 - 99 mg/dL 108(H) 129(H) 154(H)  BUN 8 - 27 mg/dL 11 7 6   Creatinine 0.76 - 1.27 mg/dL 0.95 0.87 0.98  Sodium 134 - 144 mmol/L 140 139 139  Potassium 3.5 - 5.2 mmol/L 4.1 3.4(L)  3.2(L)  Chloride 96 - 106 mmol/L 101 104 103  CO2 20 - 29 mmol/L 26 28 30   Calcium 8.6 - 10.2 mg/dL 9.4 8.7(L) 8.7(L)  Total Protein 6.5 - 8.1 g/dL - - -  Total Bilirubin 0.3 - 1.2 mg/dL - - -  Alkaline Phos 38 - 126 U/L - - -  AST 15 - 41 U/L - - -  ALT 17 - 63 U/L - - -    Lipid Panel     Component Value Date/Time   CHOL 138 09/14/2019 0820   TRIG 76 09/14/2019 0820   HDL 38 (L) 09/14/2019 0820   LDLCALC 85 09/14/2019 0820   LDLDIRECT 86 09/14/2019 0820   LABVLDL 15 09/14/2019 0820    No results found for: HGBA1C No components found for: NTPROBNP Lab Results  Component Value Date   TSH 1.160 04/16/2018   TSH 1.588 04/10/2017   TSH 1.089 04/11/2016    Cardiac Panel (last 3 results) No results for input(s): CKTOTAL, CKMB, TROPONINIHS, RELINDX in the last 72 hours.  IMPRESSION:    ICD-10-CM   1. Chronic heart failure with preserved ejection fraction (HCC)  I50.32   2. Essential hypertension  I10   3. Former smoker  Z87.891   4. Type 2 diabetes mellitus with hyperglycemia, without long-term  current use of insulin (HCC)  E11.65   5. OSA on CPAP  G47.33    Z99.89   6. Obesity, Class III, BMI 40-49.9 (morbid obesity) (HCC)  E66.01   7. Encounter to discuss test results  Z71.2      RECOMMENDATIONS: Melvin Hudson is a 63 y.o. male whose past medical history and cardiovascular risk factors include: Hypertension, diabetes mellitus, former tobacco use, history of throat cancer diagnosis in 2015, no significant epicardial coronary disease per angiography in 2012, chronic heart failure with preserved EF, OSA on CPAP, advanced age.  Chronic heart failure with preserved EF, stage B, NYHA class II/III:  Medications reconciled.  Patient remains euvolemic.    Patient is educated on the importance of a low-salt diet, strict I's and O's, and daily weights.  Blood pressure log reviewed.  Recommend uptitrating his guideline directed medical therapy.  Patient successfully weaned himself off clonidine.  Reviewed blood work from July 2021 as requested at the last office visit.  Educated on increasing physical activity as tolerated with a goal of 30 minutes a day 5 days a week.  Benign essential hypertension   Office blood pressure is at goal.   Medication reconciled.   If the blood pressure is consistently greater than 173mHg patient is asked to call the office or his primary care provider's office for medication titration sooner than the next office visit.   Low salt diet recommended. A diet that is rich in fruits, vegetables, legumes, and low-fat dairy products and low in snacks, sweets, and meats (such as the Dietary Approaches to Stop Hypertension [DASH] diet).   Mixed hyperlipidemia:  Physical examination findings show arcus senilis, he also has non-insulin-dependent diabetes mellitus   LDL levels >70 mg/dL.  Started on Lipitor 40 mg p.o. nightly.  Repeat fasting lipid profile in 6 weeks to reevaluate therapy.  Patient does not endorse any evidence of  myalgias.  Former smoker: Educated on the importance of continued smoking cessation.  Non-insulin-dependent diabetes mellitus: Currently managed by primary team.    Obesity, due to excess calories: Body mass index is 42.11 kg/m.  I reviewed with the patient the importance of diet, regular physical activity/exercise, weight  loss.    Patient is educated on increasing physical activity gradually as tolerated.  With the goal of moderate intensity exercise for 30 minutes a day 5 days a week.  FINAL MEDICATION LIST END OF ENCOUNTER: Meds ordered this encounter  Medications   atorvastatin (LIPITOR) 40 MG tablet    Sig: Take 1 tablet (40 mg total) by mouth at bedtime.    Dispense:  90 tablet    Refill:  1    Medications Discontinued During This Encounter  Medication Reason   cloNIDine (CATAPRES) 0.1 MG tablet Discontinued by provider   Multiple Vitamin (MULTIVITAMIN WITH MINERALS) TABS tablet Patient Preference   atorvastatin (LIPITOR) 40 MG tablet Patient Preference     Current Outpatient Medications:    allopurinol (ZYLOPRIM) 300 MG tablet, Take 300 mg by mouth daily as needed. , Disp: , Rfl:    aspirin 81 MG tablet, Take 81 mg by mouth daily. , Disp: , Rfl:    atorvastatin (LIPITOR) 40 MG tablet, Take 1 tablet (40 mg total) by mouth at bedtime., Disp: 90 tablet, Rfl: 1   BIDIL 20-37.5 MG tablet, TAKE 1 TABLET BY MOUTH THREE TIMES DAILY, Disp: 90 tablet, Rfl: 0   Blood Glucose Monitoring Suppl (ONETOUCH VERIO FLEX SYSTEM) w/Device KIT, USE TO CHECK BLOOD SUGAR TWICE DAILY, Disp: , Rfl:    Blood Glucose Monitoring Suppl (ONETOUCH VERIO FLEX SYSTEM) w/Device KIT, USE TO CHECK BLOOD SUGAR TWICE DAILY, Disp: , Rfl:    calcium citrate-vitamin D 500-500 MG-UNIT chewable tablet, Chew 1 tablet by mouth daily. 1/2 680m at bedtime, Disp: , Rfl:    carvedilol (COREG) 25 MG tablet, Take 25 mg by mouth 2 (two) times daily with a meal., Disp: , Rfl:    cetirizine (ZYRTEC) 10 MG  tablet, Take 10 mg by mouth daily. , Disp: , Rfl: 2   enalapril (VASOTEC) 2.5 MG tablet, Take 2.5 mg by mouth daily., Disp: , Rfl:    eplerenone (INSPRA) 50 MG tablet, Take 50 mg by mouth daily., Disp: , Rfl: 2   furosemide (LASIX) 40 MG tablet, Take 40 mg by mouth 2 (two) times daily., Disp: , Rfl: 2   gabapentin (NEURONTIN) 300 MG capsule, Take 300 mg by mouth 3 (three) times daily. , Disp: , Rfl: 2   hydrocortisone-pramoxine (PROCTOFOAM-HC) rectal foam, Place 1 applicator rectally 2 (two) times daily. (Patient taking differently: Place 1 applicator rectally as needed. ), Disp: 10 g, Rfl: 0   metFORMIN (GLUCOPHAGE) 500 MG tablet, Take 500 mg by mouth 2 (two) times daily with a meal. , Disp: , Rfl:    NIFEdipine (ADALAT CC) 90 MG 24 hr tablet, Take 90 mg by mouth daily., Disp: , Rfl:    omeprazole (PRILOSEC) 20 MG capsule, Take 20 mg by mouth daily. , Disp: , Rfl: 4   ONETOUCH VERIO test strip, USE TO CHECK BLOOD SUGAR TWICE DAILY, Disp: , Rfl:    oxyCODONE-acetaminophen (PERCOCET) 10-325 MG per tablet, Take 1 tablet by mouth every 6 (six) hours as needed for pain. (Patient taking differently: Take 1 tablet by mouth 4 (four) times daily. ), Disp: 120 tablet, Rfl: 0   Vitamin D, Ergocalciferol, (DRISDOL) 1.25 MG (50000 UT) CAPS capsule, Take 50,000 Units by mouth. Every Friday., Disp: , Rfl:    diphenhydrAMINE (BENADRYL) 25 MG tablet, Take 1 tablet (25 mg total) by mouth every 6 (six) hours. (Patient not taking: Reported on 10/07/2019), Disp: 20 tablet, Rfl: 0  No orders of the defined types were placed  in this encounter.  --Continue cardiac medications as reconciled in final medication list. --Return in about 6 months (around 04/08/2020) for heart failure management.. Or sooner if needed. --Continue follow-up with your primary care physician regarding the management of your other chronic comorbid conditions.  Patient's questions and concerns were addressed to his satisfaction. He  voices understanding of the instructions provided during this encounter.   This note was created using a voice recognition software as a result there may be grammatical errors inadvertently enclosed that do not reflect the nature of this encounter. Every attempt is made to correct such errors.  Rex Kras, [place], [facility]  Pager: [phone] Office: [phone]

## 2019-11-02 ENCOUNTER — Telehealth: Payer: Self-pay | Admitting: Adult Health

## 2019-11-02 DIAGNOSIS — G4733 Obstructive sleep apnea (adult) (pediatric): Secondary | ICD-10-CM

## 2019-11-02 NOTE — Telephone Encounter (Signed)
Spoke with patient regarding prior message. Patient stated that he would like to change his DME company .   PCC's can you please advise.  Thank you

## 2019-11-03 ENCOUNTER — Other Ambulatory Visit: Payer: Self-pay | Admitting: Cardiology

## 2019-11-03 DIAGNOSIS — I10 Essential (primary) hypertension: Secondary | ICD-10-CM

## 2019-11-03 DIAGNOSIS — I5032 Chronic diastolic (congestive) heart failure: Secondary | ICD-10-CM

## 2019-11-03 NOTE — Telephone Encounter (Signed)
We can change it but will need a new order

## 2019-11-03 NOTE — Telephone Encounter (Signed)
Called and spoke with pt letting him know that we could place a new order and he verbalized understanding. Pt did not have a DME in mind that he preferred just wanted anyone but [employer]. Order has been placed and specified that. Nothing further needed.

## 2019-11-04 ENCOUNTER — Telehealth: Payer: Self-pay | Admitting: Pulmonary Disease

## 2019-11-04 NOTE — Telephone Encounter (Signed)
Called and spoke with pt letting him know that DME needed him to have OV prior to him able to receive supplies for cpap and pt verbalized understanding. Scheduled pt for an OV with Aaron Edelman 9/24. Nothing further needed.

## 2019-11-08 ENCOUNTER — Encounter (HOSPITAL_COMMUNITY): Payer: Self-pay | Admitting: Dentistry

## 2019-11-08 ENCOUNTER — Ambulatory Visit (HOSPITAL_COMMUNITY): Payer: Medicaid - Dental | Admitting: Dentistry

## 2019-11-08 ENCOUNTER — Other Ambulatory Visit: Payer: Self-pay

## 2019-11-08 VITALS — BP 112/76 | HR 75 | Temp 98.4°F

## 2019-11-08 DIAGNOSIS — C099 Malignant neoplasm of tonsil, unspecified: Secondary | ICD-10-CM

## 2019-11-08 DIAGNOSIS — Z923 Personal history of irradiation: Secondary | ICD-10-CM

## 2019-11-08 DIAGNOSIS — Z463 Encounter for fitting and adjustment of dental prosthetic device: Secondary | ICD-10-CM | POA: Diagnosis not present

## 2019-11-08 DIAGNOSIS — K117 Disturbances of salivary secretion: Secondary | ICD-10-CM

## 2019-11-08 DIAGNOSIS — K08109 Complete loss of teeth, unspecified cause, unspecified class: Secondary | ICD-10-CM | POA: Diagnosis not present

## 2019-11-08 DIAGNOSIS — K0820 Unspecified atrophy of edentulous alveolar ridge: Secondary | ICD-10-CM

## 2019-11-08 NOTE — Progress Notes (Signed)
11/08/2019  Patient Name:   Melvin Hudson Date of Birth:   March 08, 1955 Medical Record Number: [id number]   COVID 19 SCREENING: The patient does not symptoms concerning for COVID-19 infection (Including fever, chills, cough, or new SHORTNESS OF BREATH).  Patient had 2 doses of the Pfizer vaccine and has just recently had the third booster dose.  BP 112/76 (BP Location: Left Arm)   Pulse 75   Temp 98.4 F (36.9 C)   Melvin Hudson is a 64 year old male that presents for periodic oral exam and evaluation of dentures. Patient has a history of squamous cell carcinoma of the right tonsil. Patient had extraction of remaining teeth with alveoloplasty and lateral exostoses reductions on 07/06/2013. Patient then underwent radiation therapy from 07/26/2013 through 09/15/2013. Patient had upper and lower complete dentures fabricated and were inserted on 11/08/2014. Patient has been seen for multiple denture adjustment appointments since that time. The patient now presents for periodic oral examination and denture recall. Patient was recently seen by Dr. Terri Skains for Cardiology heart failure follow up.   Medical Hx Update:  Past Medical History:  Diagnosis Date  . Angioedema 09/17/11   tongue and lips  . Arthritis    "both of my legs and feet" (01/05/2014)  . CHF (congestive heart failure) ([place])   . Diabetes mellitus without complication ([place])   . Erectile dysfunction 04/20/2018  . GI bleed   . Heart murmur 04/20/2018  . Hx of gout   . Hypertension   . Obesity   . OSA on CPAP   . Pure hypercholesterolemia 04/20/2018  . S/P radiation therapy 07/26/2013-09/15/2013   Right tonsil/bilateral neck/ 7000 cGy  . Squamous cell carcinoma of right tonsil ([place]) 06/14/13  .  Past Surgical History:  Procedure Laterality Date  . COLONOSCOPY Left 04/11/2014   Procedure: COLONOSCOPY;  Surgeon: Arta Silence, MD;  Location[facility] END[facility]COPY;  Service: Endoscopy;  Laterality: Left;  . COLONOSCOPY N/A 04/20/2014    Procedure: COLONOSCOPY;  Surgeon: Lear Ng, MD;  Location: [facility] ENDOSCOPY;  Service: Endoscopy;  Laterality: N/A;  . COLONOSCOPY W/ BIOPSIES AND POLYPECTOMY     benign  . DIRECT LARYNGOSCOPY  01/05/2014  . DIRECT LARYNGOSCOPY N/A 01/05/2014   Procedure: DIRECT LARYNGOSCOPY AND BIOPSY;  Surgeon: [name], MD;  Location: [facility];  Service: ENT;  Laterality: N/A;  . KNEE ARTHROSCOPY Right ~ 1994  . LEFT AND RIGHT HEART CATHETERIZATION WITH CORONARY/GRAFT ANGIOGRAM N/A 12/31/2010   Procedure: LEFT AND RIGHT HEART CATHETERIZATION WITH Beatrix Fetters;  Surgeon: Laverda Page, MD;  Location: [facility] CATH LAB;  Service: Cardiovascular;  Laterality: N/A;  . MULTIPLE EXTRACTIONS WITH ALVEOLOPLASTY N/A 07/08/2013   Procedure: Extraction of tooth #'s 22, 27 with alveoloplasty and bilateal mandibular facial exostoses reductions;  Surgeon: Lenn Cal, DDS;  Location: [facility] ORS;  Service: Oral Surgery;  Laterality: N/A;  . MULTIPLE TOOTH EXTRACTIONS  ~ 2008    ALLERGIES/ADVERSE DRUG REACTIONS: Allergies  Allergen Reactions  . Azilsartan Swelling    Avoid ARB and ACEI per MD  . Advil [Ibuprofen]     Feels "jittery"    MEDICATIONS: Current Outpatient Medications  Medication Sig Dispense Refill  . allopurinol (ZYLOPRIM) 300 MG tablet Take 300 mg by mouth daily as needed.     [name] aspirin 81 MG tablet Take 81 mg by mouth daily.     [name] atorvastatin (LIPITOR) 40 MG tablet Take 1 tablet (40 mg total) by mouth at bedtime. 90 tablet 1  . BIDIL 20-37.5 MG  tablet TAKE 1 TABLET BY MOUTH THREE TIMES DAILY 90 tablet 0  . BIDIL 20-37.5 MG tablet TAKE 1 TABLET BY MOUTH THREE TIMES DAILY 90 tablet 0  . Blood Glucose Monitoring Suppl (ONETOUCH VERIO FLEX SYSTEM) w/Device KIT USE TO CHECK BLOOD SUGAR TWICE DAILY    . Blood Glucose Monitoring Suppl (ONETOUCH VERIO FLEX SYSTEM) w/Device KIT USE TO CHECK BLOOD SUGAR TWICE DAILY    . calcium citrate-vitamin D 500-500 MG-UNIT chewable tablet Chew 1  tablet by mouth daily. 1/2 660m at bedtime    . carvedilol (COREG) 25 MG tablet Take 25 mg by mouth 2 (two) times daily with a meal.    . cetirizine (ZYRTEC) 10 MG tablet Take 10 mg by mouth daily.   2  . diphenhydrAMINE (BENADRYL) 25 MG tablet Take 1 tablet (25 mg total) by mouth every 6 (six) hours. (Patient not taking: Reported on 10/07/2019) 20 tablet 0  . enalapril (VASOTEC) 2.5 MG tablet Take 2.5 mg by mouth daily.    .Marland Kitcheneplerenone (INSPRA) 50 MG tablet Take 50 mg by mouth daily.  2  . furosemide (LASIX) 40 MG tablet Take 40 mg by mouth 2 (two) times daily.  2  . gabapentin (NEURONTIN) 300 MG capsule Take 300 mg by mouth 3 (three) times daily.   2  . hydrocortisone-pramoxine (PROCTOFOAM-HC) rectal foam Place 1 applicator rectally 2 (two) times daily. (Patient taking differently: Place 1 applicator rectally as needed. ) 10 g 0  . metFORMIN (GLUCOPHAGE) 500 MG tablet Take 500 mg by mouth 2 (two) times daily with a meal.     . NIFEdipine (ADALAT CC) 90 MG 24 hr tablet Take 90 mg by mouth daily.    .Marland Kitchenomeprazole (PRILOSEC) 20 MG capsule Take 20 mg by mouth daily.   4  . ONETOUCH VERIO test strip USE TO CHECK BLOOD SUGAR TWICE DAILY    . oxyCODONE-acetaminophen (PERCOCET) 10-325 MG per tablet Take 1 tablet by mouth every 6 (six) hours as needed for pain. (Patient taking differently: Take 1 tablet by mouth 4 (four) times daily. ) 120 tablet 0  . Vitamin D, Ergocalciferol, (DRISDOL) 1.25 MG (50000 UT) CAPS capsule Take 50,000 Units by mouth. Every Friday.     No current facility-administered medications for this visit.    C/C: Patient is not complaining of any denture irritation.   HPI:  Patient has a history of squamous cell carcinoma of the right tonsil. Patient had extraction of remaining teeth with alveoloplasty and lateral exostoses reductions on 07/06/2013. Patient then underwent radiation therapy from 07/26/2013 through 09/15/2013. Patient had upper and lower complete dentures fabricated  and were inserted on 11/08/2014. Patient has been seen for multiple denture adjustment appointments since that time.  DENTAL EXAM: General: Patient well-developed, well-nourished male in no acute distress. Vitals: BP 112/76 (BP Location: Left Arm)   Pulse 75   Temp 98.4 F (36.9 C)  Extraoral Exam: There is no palpable lymphadenopathy.  There are no TMJ Symptoms. Intraoral  Exam: Patient has xerostomia. There is no evidence of denture irritation. Dentition: The patient is edentulous. There is atrophy of the lower edentulous alveolar ridges. Prosthodontic: Patient has upper and lower complete dentures that are stable and retentive. Pressure indicating paste was applied to the upper and lower dentures. Minimal adjustments were needed. [place]  Patient was evaluated in centric relation and protrusive strokes. No adjustments were made.  Patient tolerated the procedure well. Radiographic Interpretation: Orthopantogram taken. Patient is edentulous. Atrophy of lower alveolar ridges  noted. Wire of right mandible consistent with treatment of previous mandible fracture.  Assessments: 1. Patient is edentulous. 2. There is atrophy of the edentulous alveolar ridges. 3. Patient has xerostomia. 4. Stable and retentive upper and lower dentures.  Plan:  1. Keep dentures out if sore spots arise. Use salt water rinses as needed aid healing. 2.  Patient is to follow-up with Dr. Benson Norway in one year for annual denture recall. Patient may also choose to follow up with the dentist of his choice for continued dental care.  Patient is aware that I am retiring on November 18, 2019 from the [facility] system.  I recommended follow-up with Dr. Perlie Gold at [facility] for denture care if wants to be seen for new dentures.  Patient is aware that Medicaid will pay for a complete set of dentures once every 10 years.  Patient now has [facility] with some dental coverage.   Lenn Cal,  DDS

## 2019-11-08 NOTE — Patient Instructions (Addendum)
Plan:  1. Keep dentures out if sore spots arise. Use salt water rinses as needed aid healing. 2.  Patient is to follow-up with Dr. Benson Norway in one year for annual denture recall. Patient may also choose to follow up with the dentist of his choice for continued dental care.  Patient is aware that I am retiring on November 18, 2019 from the [facility] system.  I recommended follow-up with Dr. Perlie Gold at [facility] for denture care if wants to be seen for new dentures.  Patient is aware that Medicaid will pay for a complete set of dentures once every 10 years.  Patient now has [facility] with some dental coverage.   Lenn Cal, DDS

## 2019-11-11 ENCOUNTER — Other Ambulatory Visit: Payer: Self-pay

## 2019-11-11 ENCOUNTER — Ambulatory Visit (INDEPENDENT_AMBULATORY_CARE_PROVIDER_SITE_OTHER): Payer: Medicare HMO | Admitting: Pulmonary Disease

## 2019-11-11 ENCOUNTER — Encounter: Payer: Self-pay | Admitting: Pulmonary Disease

## 2019-11-11 VITALS — BP 122/72 | HR 78 | Temp 97.2°F | Ht 74.0 in | Wt 326.2 lb

## 2019-11-11 DIAGNOSIS — G4733 Obstructive sleep apnea (adult) (pediatric): Secondary | ICD-10-CM

## 2019-11-11 DIAGNOSIS — C090 Malignant neoplasm of tonsillar fossa: Secondary | ICD-10-CM | POA: Diagnosis not present

## 2019-11-11 DIAGNOSIS — Z87891 Personal history of nicotine dependence: Secondary | ICD-10-CM | POA: Diagnosis not present

## 2019-11-11 DIAGNOSIS — Z0000 Encounter for general adult medical examination without abnormal findings: Secondary | ICD-10-CM | POA: Insufficient documentation

## 2019-11-11 NOTE — Assessment & Plan Note (Signed)
Plan: Continue CPAP therapy Refill supplies Follow-up in 1 year

## 2019-11-11 NOTE — Progress Notes (Signed)
Reviewed and agree with assessment/plan.   Chesley Mires, MD [facility] Pulmonary/Critical Care 11/11/2019, 11:03 AM Pager:  [phone]

## 2019-11-11 NOTE — Patient Instructions (Addendum)
You were seen today by Lauraine Rinne, NP  for:   1. OSA on CPAP  We recommend that you continue using your CPAP daily >>>Keep up the hard work using your device >>> Goal should be wearing this for the entire night that you are sleeping, at least 4 to 6 hours  Remember:  . Do not drive or operate heavy machinery if tired or drowsy.  . Please notify the supply company and office if you are unable to use your device regularly due to missing supplies or machine being broken.  . Work on maintaining a healthy weight and following your recommended nutrition plan  . Maintain proper daily exercise and movement  . Maintaining proper use of your device can also help improve management of other chronic illnesses such as: Blood pressure, blood sugars, and weight management.   BiPAP/ CPAP Cleaning:  >>>Clean weekly, with Dawn soap, and bottle brush.  Set up to air dry. >>> Wipe mask out daily with wet wipe or towelette   2. Former smoker  - Ambulatory Referral for [employer]  We will refer you today to our lung cancer screening program >>>This is based off of your 84 pack-year smoking history >>> This is a recommendation from the [place] preventative services task force (USPSTF) >>>The USPSTF recommends annual screening for lung cancer with low-dose computed tomography (LDCT) in adults aged 61 to 80 years who have a 20 pack-year smoking history and currently smoke or have quit within the past 15 years. Screening should be discontinued once a person has not smoked for 15 years or develops a health problem that substantially limits life expectancy or the ability or willingness to have curative lung surgery.   Our office will call you and set up an appointment with Eric Form (Nurse Practitioner) who leads this program.  This appointment takes place in our office.  After completing this meeting with Eric Form NP you will get a low-dose CT as the screening >>>We will call you with those  results    3. Healthcare maintenance  - Ambulatory Referral for Lung Cancer Scre   We recommend today:  Orders Placed This Encounter  Procedures  . Ambulatory Referral for Lung Cancer Scre    Referral Priority:   Routine    Referral Type:   Consultation    Referral Reason:   Specialty Services Required    Number of Visits Requested:   1   Orders Placed This Encounter  Procedures  . Ambulatory Referral for Lung Cancer Scre   No orders of the defined types were placed in this encounter.   Follow Up:    Return in about 1 year (around 11/10/2020), or if symptoms worsen or fail to improve, for Follow up with Dr. Halford Chessman.   Notification of test results are managed in the following manner: If there are  any recommendations or changes to the  plan of care discussed in office today,  we will contact you and let you know what they are. If you do not hear from [place], then your results are normal and you can view them through your  MyChart account , or a letter will be sent to you. Thank you again for trusting [place] with your care  - Thank you, [facility]    It is flu season:   >>> Best ways to protect herself from the flu: Receive the yearly flu vaccine, practice good hand hygiene washing with soap and also using hand sanitizer when available,  eat a nutritious meals, get adequate rest, hydrate appropriately       Please contact the office if your symptoms worsen or you have concerns that you are not improving.   Thank you for choosing [facility] for your healthcare, and for allowing [place] to partner with you on your healthcare journey. I am thankful to be able to provide care to you today.   Wyn Quaker FNP-C    Sleep Apnea Sleep apnea affects breathing during sleep. It causes breathing to stop for a short time or to become shallow. It can also increase the risk of:  Heart attack.  Stroke.  Being very overweight (obese).  Diabetes.  Heart failure.  Irregular  heartbeat. The goal of treatment is to help you breathe normally again. What are the causes? There are three kinds of sleep apnea:  Obstructive sleep apnea. This is caused by a blocked or collapsed airway.  Central sleep apnea. This happens when the brain does not send the right signals to the muscles that control breathing.  Mixed sleep apnea. This is a combination of obstructive and central sleep apnea. The most common cause of this condition is a collapsed or blocked airway. This can happen if:  Your throat muscles are too relaxed.  Your tongue and tonsils are too large.  You are overweight.  Your airway is too small. What increases the risk?  Being overweight.  Smoking.  Having a small airway.  Being older.  Being male.  Drinking alcohol.  Taking medicines to calm yourself (sedatives or tranquilizers).  Having family members with the condition. What are the signs or symptoms?  Trouble staying asleep.  Being sleepy or tired during the day.  Getting angry a lot.  Loud snoring.  Headaches in the morning.  Not being able to focus your mind (concentrate).  Forgetting things.  Less interest in sex.  Mood swings.  Personality changes.  Feelings of sadness (depression).  Waking up a lot during the night to pee (urinate).  Dry mouth.  Sore throat. How is this diagnosed?  Your medical history.  A physical exam.  A test that is done when you are sleeping (sleep study). The test is most often done in a sleep lab but may also be done at home. How is this treated?   Sleeping on your side.  Using a medicine to get rid of mucus in your nose (decongestant).  Avoiding the use of alcohol, medicines to help you relax, or certain pain medicines (narcotics).  Losing weight, if needed.  Changing your diet.  Not smoking.  Using a machine to open your airway while you sleep, such as: ? An oral appliance. This is a mouthpiece that shifts your lower  jaw forward. ? A CPAP device. This device blows air through a mask when you breathe out (exhale). ? An EPAP device. This has valves that you put in each nostril. ? A BPAP device. This device blows air through a mask when you breathe in (inhale) and breathe out.  Having surgery if other treatments do not work. It is important to get treatment for sleep apnea. Without treatment, it can lead to:  High blood pressure.  Coronary artery disease.  In men, not being able to have an erection (impotence).  Reduced thinking ability. Follow these instructions at home: Lifestyle  Make changes that your doctor recommends.  Eat a healthy diet.  Lose weight if needed.  Avoid alcohol, medicines to help you relax, and some pain medicines.  Do not use any products that contain nicotine or tobacco, such as cigarettes, e-cigarettes, and chewing tobacco. If you need help quitting, ask your doctor. General instructions  Take over-the-counter and prescription medicines only as told by your doctor.  If you were given a machine to use while you sleep, use it only as told by your doctor.  If you are having surgery, make sure to tell your doctor you have sleep apnea. You may need to bring your device with you.  Keep all follow-up visits as told by your doctor. This is important. Contact a doctor if:  The machine that you were given to use during sleep bothers you or does not seem to be working.  You do not get better.  You get worse. Get help right away if:  Your chest hurts.  You have trouble breathing in enough air.  You have an uncomfortable feeling in your back, arms, or stomach.  You have trouble talking.  One side of your body feels weak.  A part of your face is hanging down. These symptoms may be an emergency. Do not wait to see if the symptoms will go away. Get medical help right away. Call your local emergency services (911 in the [place] Do not drive yourself to the  hospital. Summary  This condition affects breathing during sleep.  The most common cause is a collapsed or blocked airway.  The goal of treatment is to help you breathe normally while you sleep. This information is not intended to replace advice given to you by your health care provider. Make sure you discuss any questions you have with your health care provider. Document Revised: 11/20/2017 Document Reviewed: 09/29/2017 Elsevier Patient Education  [place].

## 2019-11-11 NOTE — Assessment & Plan Note (Signed)
Former smoker Quit 2015 84-pack-year smoking history Throat cancer in 2015, completed treatment end of 2015 early 2016 Last CT axial imaging of chest in 2016  Plan: Referral to lung cancer screening program today

## 2019-11-11 NOTE — Progress Notes (Signed)
@Patient  ID: Melvin Hudson, male    DOB: [birth year], 64 y.o.   MRN: [id number]  Chief Complaint  Patient presents with  . Follow-up    OSA    Referring provider: [name], MD  HPI:  64 year old male former smoker followed in our office for severe obstructive sleep apnea  PMH: Hypertension, gout Smoker/ Smoking History: Former smoker. Quit 2015. 84 pack year smoker.  Maintenance: None Pt of: Dr. Halford Chessman  11/11/2019  - Visit   64 year old male former smoker followed in our office for severe obstructive sleep apnea.  Presenting today for a follow-up on CPAP therapy.  Patient is also former smoker.  Quit in 2015.  84-pack-year smoking history.  Patient is up-to-date with COVID-19 vaccinations and has received his booster vaccine as well.  Patient is delaying receiving his flu vaccine until next month given his recent COVID-19 booster shot.  Patient reports that CPAP therapy is going well.  CPAP compliance report confirms this.  See compliance were listed below:  10/11/2019-11/09/2019-30 had a last 30 days use, 30 those days greater than 4 hours, average usage 8 hours and 39 minutes, CPAP set pressure of 11, AHI 1  Patient reports that he cannot sleep without his CPAP.  Patient is status post throat cancer in 2015.  When he stop smoking.  Last CT axial imaging was in 2016.  Tests:   Sleep tests:  PSG 12/01/11 >> AHI 43  05/25/2014-CT chest with contrast-no findings to suggest metastatic disease to the thorax on today's exam, 3 mm subpleural nodule in the periphery of the right upper lobe unchanged, favored to represent a small subpleural lymph node,  arthrosclerosis  FENO:  No results found for: NITRICOXIDE  PFT: No flowsheet data found.  WALK:  No flowsheet data found.  Imaging: No results found.  Lab Results:  CBC    Component Value Date/Time   WBC 6.7 08/31/2016 0427   RBC 4.11 (L) 08/31/2016 0427   HGB 10.4 (L) 09/03/2016 1140   HGB 12.3 (L) 06/30/2013 1437    HCT 30.3 (L) 09/03/2016 1140   HCT 35.4 (L) 06/30/2013 1437   PLT 216 08/31/2016 0427   PLT 334 06/30/2013 1437   MCV 83.0 08/31/2016 0427   MCV 83.1 06/30/2013 1437   MCH 28.5 08/31/2016 0427   MCHC 34.3 08/31/2016 0427   RDW 18.6 (H) 08/31/2016 0427   RDW 16.8 (H) 06/30/2013 1437   LYMPHSABS 0.5 (L) 04/18/2014 0347   LYMPHSABS 2.3 06/30/2013 1437   MONOABS 0.3 04/18/2014 0347   MONOABS 0.8 06/30/2013 1437   EOSABS 0.1 04/18/2014 0347   EOSABS 0.4 06/30/2013 1437   BASOSABS 0.0 04/18/2014 0347   BASOSABS 0.0 06/30/2013 1437    BMET    Component Value Date/Time   NA 140 09/14/2019 0820   NA 139 08/15/2013 1428   K 4.1 09/14/2019 0820   K 4.0 08/15/2013 1428   CL 101 09/14/2019 0820   CO2 26 09/14/2019 0820   CO2 29 08/15/2013 1428   GLUCOSE 108 (H) 09/14/2019 0820   GLUCOSE 129 (H) 08/31/2016 1320   GLUCOSE 155 (H) 08/15/2013 1428   BUN 11 09/14/2019 0820   BUN 14.3 08/15/2013 1428   CREATININE 0.95 09/14/2019 0820   CREATININE 1.2 08/15/2013 1428   CALCIUM 9.4 09/14/2019 0820   CALCIUM 9.8 08/15/2013 1428   GFRNONAA 84 09/14/2019 0820   GFRAA 97 09/14/2019 0820    BNP No results found for: BNP  ProBNP    Component Value  Date/Time   PROBNP 32 09/14/2019 0820    Specialty Problems      Pulmonary Problems   OSA (obstructive sleep apnea)    NPSG 11/2011:  AHI 43/hr.  Auto 06/2012:  Optimal pressure 14cm.       Malignant neoplasm of tonsillar fossa [facility])    Patient had locally advanced tonsillar cancer.      Pharyngeal cancer (HCC)      Allergies  Allergen Reactions  . Azilsartan Swelling    Avoid ARB and ACEI per MD  . Advil [Ibuprofen]     Feels "jittery"    Immunization History  Administered Date(s) Administered  . Influenza Inj Mdck Quad Pf 11/26/2017  . Influenza Split 11/12/2011, 11/08/2012, 11/17/2016  . Influenza,inj,Quad PF,6+ Mos 11/18/2015, 10/24/2016, 10/13/2018  . Influenza-Unspecified 11/24/2013  . PFIZER SARS-COV-2  Vaccination 04/15/2019, 05/13/2019, 10/26/2019    Past Medical History:  Diagnosis Date  . Angioedema 09/17/11   tongue and lips  . Arthritis    "both of my legs and feet" (01/05/2014)  . CHF (congestive heart failure) ([place])   . Diabetes mellitus without complication ([place])   . Erectile dysfunction 04/20/2018  . GI bleed   . Heart murmur 04/20/2018  . Hx of gout   . Hypertension   . Obesity   . OSA on CPAP   . Pure hypercholesterolemia 04/20/2018  . S/P radiation therapy 07/26/2013-09/15/2013   Right tonsil/bilateral neck/ 7000 cGy  . 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 Packs/day: 2.00  . Years: 42.00  . Pack years: 84.00  . Types: Cigarettes, Cigars  . Quit date: 06/17/2013  . Years since quitting: 6.4  Smokeless Tobacco Never Used   Counseling given: Yes   Continue to not smoke  Outpatient Encounter Medications as of 11/11/2019  Medication Sig  . allopurinol (ZYLOPRIM) 300 MG tablet Take 300 mg by mouth daily as needed.   [name] aspirin 81 MG tablet Take 81 mg by mouth daily.   [name] atorvastatin (LIPITOR) 40 MG tablet Take 1 tablet (40 mg total) by mouth at bedtime.  [name] BIDIL 20-37.5 MG tablet TAKE 1 TABLET BY MOUTH THREE TIMES DAILY  . BIDIL 20-37.5 MG tablet TAKE 1 TABLET BY MOUTH THREE TIMES DAILY  . Blood Glucose Monitoring Suppl (ONETOUCH VERIO FLEX SYSTEM) w/Device KIT USE TO CHECK BLOOD SUGAR TWICE DAILY  . Blood Glucose Monitoring Suppl (ONETOUCH VERIO FLEX SYSTEM) w/Device KIT USE TO CHECK BLOOD SUGAR TWICE DAILY  . calcium citrate-vitamin D 500-500 MG-UNIT chewable tablet Chew 1 tablet by mouth daily. 1/2 68m at bedtime  . carvedilol (COREG) 25 MG tablet Take 25 mg by mouth 2 (two) times daily with a meal.  . cetirizine (ZYRTEC) 10 MG tablet Take 10 mg by mouth daily.   . diphenhydrAMINE (BENADRYL) 25 MG tablet Take 1 tablet (25 mg total) by mouth every 6 (six) hours.  .  enalapril (VASOTEC) 2.5 MG tablet Take 2.5 mg by mouth daily.  .Marland Kitcheneplerenone (INSPRA) 50 MG tablet Take 50 mg by mouth daily.  . furosemide (LASIX) 40 MG tablet Take 40 mg by mouth 2 (two) times daily.  .Marland Kitchengabapentin (NEURONTIN) 300 MG capsule Take 300 mg by mouth 3 (three) times daily.   . hydrocortisone-pramoxine (PROCTOFOAM-HC) rectal foam Place 1 applicator rectally 2 (two) times daily. (Patient taking differently: Place 1 applicator rectally as needed. )  . metFORMIN (GLUCOPHAGE) 500 MG  tablet Take 500 mg by mouth 2 (two) times daily with a meal.   . NIFEdipine (ADALAT CC) 90 MG 24 hr tablet Take 90 mg by mouth daily.  [name] omeprazole (PRILOSEC) 20 MG capsule Take 20 mg by mouth daily.   Glory Rosebush VERIO test strip USE TO CHECK BLOOD SUGAR TWICE DAILY  . oxyCODONE-acetaminophen (PERCOCET) 10-325 MG per tablet Take 1 tablet by mouth every 6 (six) hours as needed for pain. (Patient taking differently: Take 1 tablet by mouth 4 (four) times daily. )  . Vitamin D, Ergocalciferol, (DRISDOL) 1.25 MG (50000 UT) CAPS capsule Take 50,000 Units by mouth. Every Friday.   No facility-administered encounter medications on file as of 11/11/2019.     Review of Systems  Review of Systems  Constitutional: Negative for activity change, chills, fatigue, fever and unexpected weight change.  HENT: Negative for postnasal drip, rhinorrhea, sinus pressure, sinus pain and sore throat.   Eyes: Negative.   Respiratory: Negative for cough, shortness of breath and wheezing.   Cardiovascular: Negative for chest pain and palpitations.  Gastrointestinal: Negative for constipation, diarrhea, nausea and vomiting.  Endocrine: Negative.   Genitourinary: Negative.   Musculoskeletal: Negative.   Skin: Negative.   Neurological: Negative for dizziness and headaches.  Psychiatric/Behavioral: Negative.  Negative for dysphoric mood. The patient is not nervous/anxious.   All other systems reviewed and are negative.    Physical  Exam  BP 122/72 (BP Location: Left Arm, Cuff Size: Normal)   Pulse 78   Temp (!) 97.2 F (36.2 C) (Oral)   Ht 6' 2"  (1.88 m)   Wt (!) 326 lb 3.2 oz (148 kg)   SpO2 96%   BMI 41.88 kg/m   Wt Readings from Last 5 Encounters:  11/11/19 (!) 326 lb 3.2 oz (148 kg)  10/07/19 (!) 328 lb (148.8 kg)  09/13/19 (!) 333 lb 6.4 oz (151.2 kg)  03/14/19 (!) 348 lb 12.8 oz (158.2 kg)  11/11/18 (!) 348 lb (157.9 kg)    BMI Readings from Last 5 Encounters:  11/11/19 41.88 kg/m  10/07/19 42.11 kg/m  09/13/19 42.81 kg/m  03/14/19 44.78 kg/m  11/11/18 45.91 kg/m     Physical Exam Vitals and nursing note reviewed.  Constitutional:      General: He is not in acute distress.    Appearance: Normal appearance. He is obese.  HENT:     Head: Normocephalic and atraumatic.     Right Ear: Hearing and external ear normal.     Left Ear: Hearing and external ear normal.     Nose: Nose normal. No mucosal edema or rhinorrhea.     Right Turbinates: Not enlarged.     Left Turbinates: Not enlarged.     Mouth/Throat:     Mouth: Mucous membranes are dry.     Pharynx: Oropharynx is clear. No oropharyngeal exudate.  Eyes:     Pupils: Pupils are equal, round, and reactive to light.  Cardiovascular:     Rate and Rhythm: Normal rate and regular rhythm.     Pulses: Normal pulses.     Heart sounds: Normal heart sounds. No murmur heard.   Pulmonary:     Effort: Pulmonary effort is normal.     Breath sounds: Normal breath sounds. No decreased breath sounds, wheezing or rales.  Musculoskeletal:     Cervical back: Normal range of motion.  Lymphadenopathy:     Cervical: No cervical adenopathy.  Skin:    General: Skin is warm and dry.  Capillary Refill: Capillary refill takes less than 2 seconds.     Findings: No erythema or rash.  Neurological:     General: No focal deficit present.     Mental Status: He is alert and oriented to person, place, and time.     Motor: No weakness.     Coordination:  Coordination normal.     Gait: Gait is intact. Gait normal.  Psychiatric:        Mood and Affect: Mood normal.        Behavior: Behavior normal. Behavior is cooperative.        Thought Content: Thought content normal.        Judgment: Judgment normal.       Assessment & Plan:   OSA (obstructive sleep apnea) Plan: Continue CPAP therapy Refill supplies Follow-up in 1 year  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[place]) Status post treatment and 2015 Former smoker   Healthcare maintenance Up-to-date on COVID-19 vaccinations with booster  Plan: Received seasonal flu vaccine in fall/2021 Referral to lung cancer screening program  Former smoker Former smoker Quit 2015 84-pack-year smoking history Throat cancer in 2015, completed treatment end of 2015 early 2016 Last CT axial imaging of chest in 2016  Plan: Referral to lung cancer screening program today    Return in about 1 year (around 11/10/2020), or if symptoms worsen or fail to improve, for Follow up with Dr. Halford Chessman.   Lauraine Rinne, NP 11/11/2019   This appointment required 34 minutes of patient care (this includes precharting, chart review, review of results, face-to-face care, etc.).

## 2019-11-11 NOTE — Assessment & Plan Note (Signed)
Status post treatment and 2015 Former smoker

## 2019-11-11 NOTE — Assessment & Plan Note (Signed)
Up-to-date on COVID-19 vaccinations with booster  Plan: Received seasonal flu vaccine in fall/2021 Referral to lung cancer screening program

## 2019-11-22 ENCOUNTER — Encounter (HOSPITAL_COMMUNITY): Payer: Medicare Other | Admitting: Dentistry

## 2019-12-09 ENCOUNTER — Other Ambulatory Visit: Payer: Self-pay | Admitting: Cardiology

## 2019-12-09 DIAGNOSIS — I5032 Chronic diastolic (congestive) heart failure: Secondary | ICD-10-CM

## 2019-12-09 DIAGNOSIS — I10 Essential (primary) hypertension: Secondary | ICD-10-CM

## 2019-12-21 ENCOUNTER — Telehealth: Payer: Self-pay | Admitting: Acute Care

## 2019-12-21 DIAGNOSIS — Z87891 Personal history of nicotine dependence: Secondary | ICD-10-CM

## 2019-12-22 NOTE — Telephone Encounter (Signed)
Spoke to pt and scheduled SDMV 01/04/20 9:00 CT ordered Nothing further needed

## 2020-01-04 ENCOUNTER — Encounter: Payer: Medicare HMO | Admitting: Acute Care

## 2020-01-04 ENCOUNTER — Telehealth: Payer: Self-pay | Admitting: Acute Care

## 2020-01-04 NOTE — Telephone Encounter (Signed)
Spoke with pt . I advised that lung screening appointments are out until mid [place]. PT would like to keep appt already scheduled. Nothing further needed.

## 2020-01-16 ENCOUNTER — Encounter: Payer: Self-pay | Admitting: Acute Care

## 2020-01-16 ENCOUNTER — Ambulatory Visit
Admission: RE | Admit: 2020-01-16 | Discharge: 2020-01-16 | Disposition: A | Discharge status: Home / Self Care | Payer: Medicare HMO | Source: Ambulatory Visit | Attending: Acute Care | Admitting: Acute Care

## 2020-01-16 ENCOUNTER — Other Ambulatory Visit: Payer: Self-pay

## 2020-01-16 ENCOUNTER — Ambulatory Visit (INDEPENDENT_AMBULATORY_CARE_PROVIDER_SITE_OTHER): Payer: Medicare HMO | Admitting: Acute Care

## 2020-01-16 VITALS — BP 124/74 | HR 73 | Temp 97.4°F | Ht 74.0 in | Wt 324.4 lb

## 2020-01-16 DIAGNOSIS — Z87891 Personal history of nicotine dependence: Secondary | ICD-10-CM

## 2020-01-16 DIAGNOSIS — Z122 Encounter for screening for malignant neoplasm of respiratory organs: Secondary | ICD-10-CM

## 2020-01-16 NOTE — Progress Notes (Signed)
Please call patient and let them  know their  low dose Ct was read as a Lung RADS 2: nodules that are benign in appearance and behavior with a very low likelihood of becoming a clinically active cancer due to size or lack of growth. Recommendation per radiology is for a repeat LDCT in 12 months. .Please let them  know we will order and schedule their  annual screening scan for 12/2020. Please let them  know there was notation of CAD on their  scan.  Please remind the patient  that this is a non-gated exam therefore degree or severity of disease  cannot be determined. Please have them  follow up with their PCP regarding potential risk factor modification, dietary therapy or pharmacologic therapy if clinically indicated. Pt.  is  currently on statin therapy. Please place order for annual  screening scan for  12/2020 and fax results to PCP. Thanks so much. 

## 2020-01-16 NOTE — Patient Instructions (Signed)
Thank you for participating in the [facility]. It was our pleasure to meet you today. We will call you with the results of your scan within the next few days. Your scan will be assigned a Lung RADS category score by the physicians reading the scans.  This Lung RADS score determines follow up scanning.  See below for description of categories, and follow up screening recommendations. We will be in touch to schedule your follow up screening annually or based on recommendations of our providers. We will fax a copy of your scan results to your Primary Care Physician, or the physician who referred you to the program, to ensure they have the results. Please call the office if you have any questions or concerns regarding your scanning experience or results.  Our office number is [phone]. Please speak with Denise Phelps, RN. She is our Lung Cancer Screening RN. If she is unavailable when you call, please have the office staff send her a message. She will return your call at her earliest convenience. Remember, if your scan is normal, we will scan you annually as long as you continue to meet the criteria for the program. (Age 55-77, Current smoker or smoker who has quit within the last 15 years). If you are a smoker, remember, quitting is the single most powerful action that you can take to decrease your risk of lung cancer and other pulmonary, breathing related problems. We know quitting is hard, and we are here to help.  Please let us know if there is anything we can do to help you meet your goal of quitting. If you are a former smoker, congratulations. We are proud of you! Remain smoke free! Remember you can refer friends or family members through the number above.  We will screen them to make sure they meet criteria for the program. Thank you for helping us take better care of you by participating in Lung Screening.  Lung RADS Categories:  Lung RADS 1: no nodules  or definitely non-concerning nodules.  Recommendation is for a repeat annual scan in 12 months.  Lung RADS 2:  nodules that are non-concerning in appearance and behavior with a very low likelihood of becoming an active cancer. Recommendation is for a repeat annual scan in 12 months.  Lung RADS 3: nodules that are probably non-concerning , includes nodules with a low likelihood of becoming an active cancer.  Recommendation is for a 6-month repeat screening scan. Often noted after an upper respiratory illness. We will be in touch to make sure you have no questions, and to schedule your 6-month scan.  Lung RADS 4 A: nodules with concerning findings, recommendation is most often for a follow up scan in 3 months or additional testing based on our provider's assessment of the scan. We will be in touch to make sure you have no questions and to schedule the recommended 3 month follow up scan.  Lung RADS 4 B:  indicates findings that are concerning. We will be in touch with you to schedule additional diagnostic testing based on our provider's  assessment of the scan.   

## 2020-01-16 NOTE — Progress Notes (Signed)
Shared Decision Making Visit Lung Cancer Screening Program [phone])   Eligibility:  Age 64 y.o.  Pack Years Smoking History Calculation 86 pack year smoking history (# packs/per year x # years smoked)  Recent History of coughing up blood  no  Unexplained weight loss? no ( >Than 15 pounds within the last 6 months )  Prior History Lung / other cancer no (Diagnosis within the last 5 years already requiring surveillance chest CT Scans).  Smoking Status Former Smoker  Former Smokers: Years since quit: 6 years  Quit Date: 2015  Visit Components:  Discussion included one or more decision making aids. yes  Discussion included risk/benefits of screening. yes  Discussion included potential follow up diagnostic testing for abnormal scans. yes  Discussion included meaning and risk of over diagnosis. yes  Discussion included meaning and risk of False Positives. yes  Discussion included meaning of total radiation exposure. yes  Counseling Included:  Importance of adherence to annual lung cancer LDCT screening. yes  Impact of comorbidities on ability to participate in the program. yes  Ability and willingness to under diagnostic treatment. yes  Smoking Cessation Counseling:  Current Smokers:   Discussed importance of smoking cessation. yes  Information about tobacco cessation classes and interventions provided to patient. yes  Patient provided with "ticket" for LDCT Scan. yes  Symptomatic Patient. no  CounselingNA  Diagnosis Code: Tobacco Use Z72.0  Asymptomatic Patient yes  Counseling (Intermediate counseling: > three minutes counseling) S0630  Former Smokers:   Discussed the importance of maintaining cigarette abstinence. yes  Diagnosis Code: Personal History of Nicotine Dependence. Z60.109  Information about tobacco cessation classes and interventions provided to patient. Yes  Patient provided with "ticket" for LDCT Scan. yes  Written Order for Lung Cancer  Screening with LDCT placed in Epic. Yes (CT Chest Lung Cancer Screening Low Dose W/O CM) [id number] Z12.2-Screening of respiratory organs Z87.891-Personal history of nicotine dependence  BP 124/74 (BP Location: Left Arm, Cuff Size: Normal)   Pulse 73   Temp (!) 97.4 F (36.3 C) (Oral)   Ht 6\' 2"  (1.88 m)   Wt (!) 324 lb 6.4 oz (147.1 kg)   SpO2 97%   BMI 41.65 kg/m    I spent 25 minutes of face to face time with Mr. Seelman discussing the risks and benefits of lung cancer screening. We viewed a power point together that explained in detail the above noted topics. We took the time to pause the power point at intervals to allow for questions to be asked and answered to ensure understanding. We discussed that he had taken the single most powerful action possible to decrease his risk of developing lung cancer when he quit smoking. I counseled him to remain smoke free, and to contact me if he ever had the desire to smoke again so that I can provide resources and tools to help support the effort to remain smoke free. We discussed the time and location of the scan, and that either  Doroteo Glassman RN or I will call with the results within  24-48 hours of receiving them. He has my card and contact information in the event he needs to speak with me, in addition to a copy of the power point we reviewed as a resource. He verbalized understanding of all of the above and had no further questions upon leaving the office.     I explained to the patient that there has been a high incidence of coronary artery disease noted on these exams.  I explained that this is a non-gated exam therefore degree or severity cannot be determined. This patient is currently on statin therapy. I have asked the patient to follow-up with their PCP regarding any incidental finding of coronary artery disease and management with diet or medication as they feel is clinically indicated. The patient verbalized understanding of the above and had  no further questions.  Pt quit smoking 6 years ago.I congratulated him.  He has been counseled to remain smoke free.  Pt. Has had his Covid vaccines and booster and he has had his flu vaccine.    Magdalen Spatz, NP 01/16/2020

## 2020-01-19 ENCOUNTER — Other Ambulatory Visit: Payer: Self-pay | Admitting: *Deleted

## 2020-01-19 DIAGNOSIS — Z87891 Personal history of nicotine dependence: Secondary | ICD-10-CM

## 2020-04-09 ENCOUNTER — Other Ambulatory Visit: Payer: Self-pay

## 2020-04-09 ENCOUNTER — Encounter: Payer: Self-pay | Admitting: Cardiology

## 2020-04-09 ENCOUNTER — Ambulatory Visit: Payer: Medicare HMO | Admitting: Cardiology

## 2020-04-09 VITALS — BP 110/72 | HR 86 | Temp 98.0°F | Ht 74.0 in | Wt 323.0 lb

## 2020-04-09 DIAGNOSIS — G4733 Obstructive sleep apnea (adult) (pediatric): Secondary | ICD-10-CM

## 2020-04-09 DIAGNOSIS — I10 Essential (primary) hypertension: Secondary | ICD-10-CM

## 2020-04-09 DIAGNOSIS — I5032 Chronic diastolic (congestive) heart failure: Secondary | ICD-10-CM

## 2020-04-09 DIAGNOSIS — Z87891 Personal history of nicotine dependence: Secondary | ICD-10-CM

## 2020-04-09 DIAGNOSIS — Z9989 Dependence on other enabling machines and devices: Secondary | ICD-10-CM

## 2020-04-09 DIAGNOSIS — E1165 Type 2 diabetes mellitus with hyperglycemia: Secondary | ICD-10-CM

## 2020-04-09 NOTE — Progress Notes (Signed)
Melvin Hudson Date of Birth: 03-16-1955 MRN: [id number] Primary Care Provider:Reese, Zadie Cleverly, MD Former Cardiology Providers: Jeri Lager, APRN, FNP-C Primary Cardiologist: Rex Kras, DO, [facility] (established care 09/13/2019)  Date: 04/09/20 Last Office Visit: 10/07/2019  Chief Complaint  Patient presents with  . heart failure     HPI  Melvin Hudson is a 65 y.o.  male who presents to the office with a chief complaint of "68-monthfollow-up for congestive heart failure, BP and cholesterol management." Patient's past medical history and cardiovascular risk factors include: Hypertension, diabetes mellitus, former tobacco use, history of throat cancer diagnosis in 2015, no significant epicardial coronary disease per angiography in 2012, chronic heart failure with preserved EF, OSA on CPAP, advanced age.  Patient was formally under the care of Melvin Hudson I started seeing him back in July 2021 for management of congestive heart, blood pressure and cholesterol.  Patient is here for 655-monthollow-up for the management of congestive heart failure, blood pressure, and cholesterol management.  Since last office visit he has not been hospitalized for congestive heart failure symptoms.  His blood pressure is very well controlled.  He is also lost 5 pounds last office visit.  Since last office visit his Lipitor was initiated and he was recommended to have repeat blood work done in 6 weeks.  However, patient failed to follow-up but he is tolerating the medication well without any side effects or intolerances.  FUNCTIONAL STATUS: No structured exercise program or daily routine.    ALLERGIES: Allergies  Allergen Reactions  . Azilsartan Swelling    Avoid ARB and ACEI per MD  . Advil [Ibuprofen]     Feels "jittery"    MEDICATION LIST PRIOR TO VISIT: Current Outpatient Medications on File Prior to Visit  Medication Sig Dispense Refill  . allopurinol (ZYLOPRIM) 300 MG tablet Take 300 mg by mouth daily  as needed.     . Marland Kitchenspirin 81 MG tablet Take 81 mg by mouth daily.     . Marland Kitchentorvastatin (LIPITOR) 40 MG tablet Take 1 tablet (40 mg total) by mouth at bedtime. 90 tablet 1  . BIDIL 20-37.5 MG tablet TAKE 1 TABLET BY MOUTH THREE TIMES DAILY 90 tablet 0  . Blood Glucose Monitoring Suppl (ONETOUCH VERIO FLEX SYSTEM) w/Device KIT USE TO CHECK BLOOD SUGAR TWICE DAILY    . Blood Glucose Monitoring Suppl (ONETOUCH VERIO FLEX SYSTEM) w/Device KIT USE TO CHECK BLOOD SUGAR TWICE DAILY    . calcium citrate-vitamin D 500-500 MG-UNIT chewable tablet Chew 1 tablet by mouth daily. 1/2 60064mt bedtime    . carvedilol (COREG) 25 MG tablet Take 25 mg by mouth 2 (two) times daily with a meal.    . cetirizine (ZYRTEC) 10 MG tablet Take 10 mg by mouth daily.   2  . diphenhydrAMINE (BENADRYL) 25 MG tablet Take 1 tablet (25 mg total) by mouth every 6 (six) hours. 20 tablet 0  . enalapril (VASOTEC) 2.5 MG tablet Take 2.5 mg by mouth daily.    . eMarland Kitchenlerenone (INSPRA) 50 MG tablet Take 50 mg by mouth daily.  2  . furosemide (LASIX) 40 MG tablet Take 40 mg by mouth 2 (two) times daily.  2  . gabapentin (NEURONTIN) 300 MG capsule Take 300 mg by mouth 3 (three) times daily.   2  . hydrocortisone-pramoxine (PROCTOFOAM-HC) rectal foam Place 1 applicator rectally 2 (two) times daily. (Patient taking differently: Place 1 applicator rectally as needed.) 10 g 0  . metFORMIN (GLUCOPHAGE) 500 MG tablet Take  500 mg by mouth 2 (two) times daily with a meal.     . NARCAN 4 MG/0.1ML LIQD nasal spray kit SMARTSIG:Both Nares    . NIFEdipine (ADALAT CC) 90 MG 24 hr tablet Take 90 mg by mouth daily.    [name] omeprazole (PRILOSEC) 20 MG capsule Take 20 mg by mouth daily.   4  . ONETOUCH VERIO test strip USE TO CHECK BLOOD SUGAR TWICE DAILY    . oxyCODONE-acetaminophen (PERCOCET) 10-325 MG per tablet Take 1 tablet by mouth every 6 (six) hours as needed for pain. (Patient taking differently: Take 1 tablet by mouth 4 (four) times daily.) 120 tablet 0   . OZEMPIC, 0.25 OR 0.5 MG/DOSE, 2 MG/1.5ML SOPN SMARTSIG:0.25 Milligram(s) SUB-Q Once a Week    . Vitamin D, Ergocalciferol, (DRISDOL) 1.25 MG (50000 UT) CAPS capsule Take 50,000 Units by mouth. Every Friday.     No current facility-administered medications on file prior to visit.    PAST MEDICAL HISTORY: Past Medical History:  Diagnosis Date  . Angioedema 09/17/11   tongue and lips  . Arthritis    "both of my legs and feet" (01/05/2014)  . CHF (congestive heart failure) ([place])   . Diabetes mellitus without complication ([place])   . Erectile dysfunction 04/20/2018  . GI bleed   . Heart murmur 04/20/2018  . Hx of gout   . Hypertension   . Obesity   . OSA on CPAP   . Pure hypercholesterolemia 04/20/2018  . S/P radiation therapy 07/26/2013-09/15/2013   Right tonsil/bilateral neck/ 7000 cGy  . 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 COLONOSCOPY Left 04/11/2014   Procedure: COLONOSCOPY;  Surgeon: Arta Silence, MD;  Location: [facility] ENDOSCOPY;  Service: Endoscopy;  Laterality: Left;  . COLONOSCOPY N/A 04/20/2014   Procedure: COLONOSCOPY;  Surgeon: Lear Ng, MD;  Location: [facility] ENDOSCOPY;  Service: Endoscopy;  Laterality: N/A;  . COLONOSCOPY W/ BIOPSIES AND POLYPECTOMY     benign  . DIRECT LARYNGOSCOPY  01/05/2014  . DIRECT LARYNGOSCOPY N/A 01/05/2014   Procedure: DIRECT LARYNGOSCOPY AND BIOPSY;  Surgeon: [name], MD;  Location: [facility];  Service: ENT;  Laterality: N/A;  . KNEE ARTHROSCOPY Right ~ 1994  . LEFT AND RIGHT HEART CATHETERIZATION WITH CORONARY/GRAFT ANGIOGRAM N/A 12/31/2010   Procedure: LEFT AND RIGHT HEART CATHETERIZATION WITH Beatrix Fetters;  Surgeon: Laverda Page, MD;  Location: [facility] CATH LAB;  Service: Cardiovascular;  Laterality: N/A;  . MULTIPLE EXTRACTIONS WITH ALVEOLOPLASTY N/A 07/08/2013   Procedure: Extraction of tooth #'s 22, 27 with alveoloplasty and bilateal  mandibular facial exostoses reductions;  Surgeon: Lenn Cal, DDS;  Location: [facility] ORS;  Service: Oral Surgery;  Laterality: N/A;  . MULTIPLE TOOTH EXTRACTIONS  ~ 2008    FAMILY HISTORY: The patient's family history includes Aneurysm in his father; Cancer in his mother and sister; Diabetes in his father; Hypertension in his mother; Stroke in his mother.   SOCIAL HISTORY:  The patient  reports that he quit smoking about 6 years ago. His smoking use included cigarettes and cigars. He has a 84.00 pack-year smoking history. He has never used smokeless tobacco. He reports that he does not drink alcohol and does not use drugs.  Review of Systems  Constitutional: Negative for chills, fever and weight gain.  HENT: Negative for hoarse voice and nosebleeds.   Eyes: Negative for discharge, double vision and pain.  Cardiovascular: Negative for chest  pain, claudication, dyspnea on exertion, leg swelling, near-syncope, orthopnea, palpitations, paroxysmal nocturnal dyspnea and syncope.  Respiratory: Negative for hemoptysis and shortness of breath.   Musculoskeletal: Negative for muscle cramps and myalgias.  Gastrointestinal: Negative for abdominal pain, constipation, diarrhea, hematemesis, hematochezia, melena, nausea and vomiting.  Neurological: Negative for dizziness and light-headedness.   PHYSICAL EXAM: Vitals with BMI 04/09/2020 01/16/2020 11/11/2019  Height 6' 2"  6' 2"  6' 2"   Weight 323 lbs 324 lbs 6 oz 326 lbs 3 oz  BMI 41.45 83.66 29.47  Systolic 654 650 354  Diastolic 72 74 72  Pulse 86 73 78    CONSTITUTIONAL: Well-developed and well-nourished. No acute distress.  SKIN: Skin is warm and dry. No rash noted. No cyanosis. No pallor. No jaundice HEAD: Normocephalic and atraumatic.  EYES: No scleral icterus, arcus senilis MOUTH/THROAT: Moist oral membranes.  NECK: No JVD present. No thyromegaly noted. No carotid bruits  LYMPHATIC: No visible cervical adenopathy.                                                                                                      CHEST Normal respiratory effort. No intercostal retractions  LUNGS: Clear to auscultation bilaterally. No stridor. No wheezes. No rales.  CARDIOVASCULAR: Regular rate and rhythm, positive S1-S2, no murmurs rubs or gallops appreciated. ABDOMINAL: Obese, soft, nontender, nondistended, positive bowel sounds all 4 quadrants. No apparent ascites.  EXTREMITIES: Lateral compression stockings present.  Trace bilateral peripheral edema.  HEMATOLOGIC: No significant bruising NEUROLOGIC: Oriented to person, place, and time. Nonfocal. Normal muscle tone.  PSYCHIATRIC: Normal mood and affect. Normal behavior. Cooperative  CARDIAC DATABASE: EKG: 09/13/2019: Normal sinus rhythm, 86 bpm, first-degree AV block, left axis deviation, nonspecific T wave abnormalities in the anteroseptal leads.  Without underlying injury pattern.   Echocardiogram: 07/26/2018: LVEF 56%, moderate LVH, grade 1 diastolic impairment, normal left atrial pressure, mildly dilated left atrium.  Stress Testing:  Lexiscan Myoview stress test 06/28/2018: 1. Lexiscan stress test was performed. Exercise capacity was not assessed. Stress symptoms included dyspnea, dizziness. Resting blood pressure was 132/90 mmHg and peak effect blood pressure was 138/82 mmHg. The resting and stress electrocardiogram demonstrated sinus tachycardia, left anterior fascicular block, possible old posterior infarct, no resting arrhythmias and normal repolarization. Stress EKG is non diagnostic for ischemia as it is a pharmacologic stress.  2. The overall quality of the study is fair. Left ventricular cavity is noted to be normal on the rest and stress studies. Gated SPECT images reveal normal myocardial thickening and wall motion. The left ventricular ejection fraction was calculated or visually estimated to be 54%. REST and STRESS images demonstrate decreased tracer uptake in the basal  inferior and mid inferior segments of the left ventricle, likely due to tissue attenuation artifact (BMI 42). Ischemia in this region cannot be excluded. Recommend clinical correlation.  3. Low risk study.  Heart Catheterization: Coronary Angiogram [12/31/2010]: Heart cath 12/31/10: Noral LVEF. Slow flow in coronary arteries without stenosis. Mild pulmonary HTN.  LABORATORY DATA: CBC Latest Ref Rng & Units 09/03/2016 09/03/2016 09/03/2016  WBC 4.0 - 10.5 K/uL - - -  Hemoglobin 13.0 - 17.0 g/dL 10.4(L) 9.7(L) 9.9(L)  Hematocrit 39.0 - 52.0 % 30.3(L) 28.1(L) 28.4(L)  Platelets 150 - 400 K/uL - - -    CMP Latest Ref Rng & Units 09/14/2019 08/31/2016 08/31/2016  Glucose 65 - 99 mg/dL 108(H) 129(H) 154(H)  BUN 8 - 27 mg/dL 11 7 6   Creatinine 0.76 - 1.27 mg/dL 0.95 0.87 0.98  Sodium 134 - 144 mmol/L 140 139 139  Potassium 3.5 - 5.2 mmol/L 4.1 3.4(L) 3.2(L)  Chloride 96 - 106 mmol/L 101 104 103  CO2 20 - 29 mmol/L 26 28 30   Calcium 8.6 - 10.2 mg/dL 9.4 8.7(L) 8.7(L)  Total Protein 6.5 - 8.1 g/dL - - -  Total Bilirubin 0.3 - 1.2 mg/dL - - -  Alkaline Phos 38 - 126 U/L - - -  AST 15 - 41 U/L - - -  ALT 17 - 63 U/L - - -    Lipid Panel     Component Value Date/Time   CHOL 138 09/14/2019 0820   TRIG 76 09/14/2019 0820   HDL 38 (L) 09/14/2019 0820   LDLCALC 85 09/14/2019 0820   LDLDIRECT 86 09/14/2019 0820   LABVLDL 15 09/14/2019 0820    No results found for: HGBA1C No components found for: NTPROBNP Lab Results  Component Value Date   TSH 1.160 04/16/2018   TSH 1.588 04/10/2017   TSH 1.089 04/11/2016    Cardiac Panel (last 3 results) No results for input(s): CKTOTAL, CKMB, TROPONINIHS, RELINDX in the last 72 hours.  IMPRESSION:    ICD-10-CM   1. Chronic heart failure with preserved ejection fraction (HCC)  I50.32 EKG 12-Lead    CMP14+EGFR  2. Essential hypertension  I10   3. Former smoker  Z87.891   4. Type 2 diabetes mellitus with hyperglycemia, without long-term current  use of insulin (HCC)  E11.65 CMP14+EGFR  5. OSA on CPAP  G47.33    Z99.89   6. Obesity, Class III, BMI 40-49.9 (morbid obesity) (HCC)  E66.01      RECOMMENDATIONS: Jihad Brownlow is a 65 y.o. male whose past medical history and cardiovascular risk factors include: Hypertension, diabetes mellitus, former tobacco use, history of throat cancer diagnosis in 2015, no significant epicardial coronary disease per angiography in 2012, chronic heart failure with preserved EF, OSA on CPAP, advanced age.  Chronic heart failure with preserved EF, stage B, NYHA class II:  Medications reconciled.  Patient remains euvolemic.    Patient has lost 5 pounds since last office visit.  Patient is educated on the importance of a low-salt diet, strict I's and O's, and daily weights.  Patient successfully weaned himself off clonidine.  Educated on increasing physical activity as tolerated with a goal of 30 minutes a day 5 days a week.  Benign essential hypertension  . Office blood pressure is at goal.  . Medication reconciled.  . If the blood pressure is consistently greater than 160mHg patient is asked to call the office or his primary care provider's office for medication titration sooner than the next office visit.  . Low salt diet recommended. A diet that is rich in fruits, vegetables, legumes, and low-fat dairy products and low in snacks, sweets, and meats (such as the Dietary Approaches to Stop Hypertension [DASH] diet).   Mixed hyperlipidemia:  Physical examination findings show arcus senilis, he also has non-insulin-dependent diabetes mellitus   LDL levels >70 mg/dL.  Continue Lipitor 40 mg p.o. nightly.  Repeat fasting lipid profile and a CMP.  Patient  does not endorse any evidence of myalgias.  Former smoker: Educated on the importance of continued smoking cessation.  Non-insulin-dependent diabetes mellitus: Currently managed by primary team.    Obesity, due to excess calories: Body  mass index is 41.47 kg/m. . I reviewed with the patient the importance of diet, regular physical activity/exercise, weight loss.   . Patient is educated on increasing physical activity gradually as tolerated.  With the goal of moderate intensity exercise for 30 minutes a day 5 days a week.  FINAL MEDICATION LIST END OF ENCOUNTER: No orders of the defined types were placed in this encounter.    Current Outpatient Medications:  .  allopurinol (ZYLOPRIM) 300 MG tablet, Take 300 mg by mouth daily as needed. , Disp: , Rfl:  .  aspirin 81 MG tablet, Take 81 mg by mouth daily. , Disp: , Rfl:  .  atorvastatin (LIPITOR) 40 MG tablet, Take 1 tablet (40 mg total) by mouth at bedtime., Disp: 90 tablet, Rfl: 1 .  BIDIL 20-37.5 MG tablet, TAKE 1 TABLET BY MOUTH THREE TIMES DAILY, Disp: 90 tablet, Rfl: 0 .  Blood Glucose Monitoring Suppl ([place]) w/Device KIT, USE TO CHECK BLOOD SUGAR TWICE DAILY, Disp: , Rfl:  .  Blood Glucose Monitoring Suppl (ONETOUCH VERIO FLEX SYSTEM) w/Device KIT, USE TO CHECK BLOOD SUGAR TWICE DAILY, Disp: , Rfl:  .  calcium citrate-vitamin D 500-500 MG-UNIT chewable tablet, Chew 1 tablet by mouth daily. 1/2 627m at bedtime, Disp: , Rfl:  .  carvedilol (COREG) 25 MG tablet, Take 25 mg by mouth 2 (two) times daily with a meal., Disp: , Rfl:  .  cetirizine (ZYRTEC) 10 MG tablet, Take 10 mg by mouth daily. , Disp: , Rfl: 2 .  diphenhydrAMINE (BENADRYL) 25 MG tablet, Take 1 tablet (25 mg total) by mouth every 6 (six) hours., Disp: 20 tablet, Rfl: 0 .  enalapril (VASOTEC) 2.5 MG tablet, Take 2.5 mg by mouth daily., Disp: , Rfl:  .  eplerenone (INSPRA) 50 MG tablet, Take 50 mg by mouth daily., Disp: , Rfl: 2 .  furosemide (LASIX) 40 MG tablet, Take 40 mg by mouth 2 (two) times daily., Disp: , Rfl: 2 .  gabapentin (NEURONTIN) 300 MG capsule, Take 300 mg by mouth 3 (three) times daily. , Disp: , Rfl: 2 .  hydrocortisone-pramoxine (PROCTOFOAM-HC) rectal foam, Place 1  applicator rectally 2 (two) times daily. (Patient taking differently: Place 1 applicator rectally as needed.), Disp: 10 g, Rfl: 0 .  metFORMIN (GLUCOPHAGE) 500 MG tablet, Take 500 mg by mouth 2 (two) times daily with a meal. , Disp: , Rfl:  .  NARCAN 4 MG/0.1ML LIQD nasal spray kit, SMARTSIG:Both Nares, Disp: , Rfl:  .  NIFEdipine (ADALAT CC) 90 MG 24 hr tablet, Take 90 mg by mouth daily., Disp: , Rfl:  .  omeprazole (PRILOSEC) 20 MG capsule, Take 20 mg by mouth daily. , Disp: , Rfl: 4 .  ONETOUCH VERIO test strip, USE TO CHECK BLOOD SUGAR TWICE DAILY, Disp: , Rfl:  .  oxyCODONE-acetaminophen (PERCOCET) 10-325 MG per tablet, Take 1 tablet by mouth every 6 (six) hours as needed for pain. (Patient taking differently: Take 1 tablet by mouth 4 (four) times daily.), Disp: 120 tablet, Rfl: 0 .  OZEMPIC, 0.25 OR 0.5 MG/DOSE, 2 MG/1.5ML SOPN, SMARTSIG:0.25 Milligram(s) SUB-Q Once a Week, Disp: , Rfl:  .  Vitamin D, Ergocalciferol, (DRISDOL) 1.25 MG (50000 UT) CAPS capsule, Take 50,000 Units by mouth. Every Friday., Disp: ,  Rfl:   Orders Placed This Encounter  Procedures  . CMP14+EGFR  . EKG 12-Lead   --Continue cardiac medications as reconciled in final medication list. --Return in about 6 months (around 10/07/2020) for Follow up, heart failure management., Lipid. Or sooner if needed. --Continue follow-up with your primary care physician regarding the management of your other chronic comorbid conditions.  Patient's questions and concerns were addressed to his satisfaction. He voices understanding of the instructions provided during this encounter.   This note was created using a voice recognition software as a result there may be grammatical errors inadvertently enclosed that do not reflect the nature of this encounter. Every attempt is made to correct such errors.  Rex Kras, [place], [facility]  Pager: [phone] Office: [phone]

## 2020-04-11 LAB — CMP14+EGFR
ALT: 16 IU/L (ref 0–44)
AST: 15 IU/L (ref 0–40)
Albumin/Globulin Ratio: 1.9 (ref 1.2–2.2)
Albumin: 4.4 g/dL (ref 3.8–4.8)
Alkaline Phosphatase: 165 IU/L — ABNORMAL HIGH (ref 44–121)
BUN/Creatinine Ratio: 13 (ref 10–24)
BUN: 11 mg/dL (ref 8–27)
Bilirubin Total: 0.6 mg/dL (ref 0.0–1.2)
CO2: 25 mmol/L (ref 20–29)
Calcium: 9.4 mg/dL (ref 8.6–10.2)
Chloride: 103 mmol/L (ref 96–106)
Creatinine, Ser: 0.86 mg/dL (ref 0.76–1.27)
GFR calc Af Amer: 104 mL/min/{1.73_m2} (ref >59)
GFR calc non Af Amer: 90 mL/min/{1.73_m2} (ref >59)
Globulin, Total: 2.3 g/dL (ref 1.5–4.5)
Glucose: 90 mg/dL (ref 65–99)
Potassium: 4.3 mmol/L (ref 3.5–5.2)
Sodium: 143 mmol/L (ref 134–144)
Total Protein: 6.7 g/dL (ref 6.0–8.5)

## 2020-04-13 NOTE — Progress Notes (Signed)
Called pt no answer, left a vm to return the call.

## 2020-04-13 NOTE — Progress Notes (Signed)
Called and spoke with pt regarding lab results. Pt voiced understanding.

## 2020-10-03 ENCOUNTER — Telehealth: Payer: Self-pay | Admitting: Cardiology

## 2020-10-08 ENCOUNTER — Encounter: Payer: Self-pay | Admitting: Cardiology

## 2020-10-08 ENCOUNTER — Other Ambulatory Visit: Payer: Self-pay

## 2020-10-08 ENCOUNTER — Ambulatory Visit: Payer: Medicare HMO | Admitting: Cardiology

## 2020-10-08 VITALS — BP 126/81 | HR 85 | Temp 98.4°F | Resp 16 | Ht 74.0 in | Wt 322.2 lb

## 2020-10-08 DIAGNOSIS — E1165 Type 2 diabetes mellitus with hyperglycemia: Secondary | ICD-10-CM

## 2020-10-08 DIAGNOSIS — Z87891 Personal history of nicotine dependence: Secondary | ICD-10-CM

## 2020-10-08 DIAGNOSIS — G4733 Obstructive sleep apnea (adult) (pediatric): Secondary | ICD-10-CM

## 2020-10-08 DIAGNOSIS — I5032 Chronic diastolic (congestive) heart failure: Secondary | ICD-10-CM

## 2020-10-08 DIAGNOSIS — I10 Essential (primary) hypertension: Secondary | ICD-10-CM

## 2020-10-08 DIAGNOSIS — E782 Mixed hyperlipidemia: Secondary | ICD-10-CM

## 2020-10-08 MED ORDER — ENALAPRIL MALEATE 5 MG PO TABS: 5.0000 mg | ORAL_TABLET | Freq: Every morning | ORAL | 0 refills | Status: DC

## 2020-10-08 NOTE — Progress Notes (Signed)
Melvin Hudson Date of Birth: Jun 26, 1955 MRN: [id number] Primary Care Provider:Reese, Zadie Cleverly, MD Former Cardiology Providers: Melvin Lager, APRN, FNP-C Primary Cardiologist: Melvin Kras, DO, [facility] (established care 09/13/2019)  Date: 10/08/20 Last Office Visit: 04/09/2020   Chief Complaint  Patient presents with   Chronic heart failure with preserved ejection fraction    Hyperlipidemia   Follow-up    HPI  Melvin Hudson is a 65 y.o.  male who presents to the office with a chief complaint of "53-monthfollow-up heart failure/hyperlipidemia." Patient's past medical history and cardiovascular risk factors include: Hypertension, diabetes mellitus, former tobacco use, history of throat cancer diagnosis in 2015, no significant epicardial coronary disease per angiography in 2012, chronic heart failure with preserved EF, OSA on CPAP, advanced age.  Patient is being followed by our practice for management of congestive heart failure, blood pressure, cholesterol.  He presents today for 671-monthollow-up.  Patient is doing well from a cardiovascular standpoint.  He denies any chest pain or shortness of breath at rest or with effort related activities.  No hospitalizations or urgent care visits for cardiovascular symptoms since last office visit in February 2022.  Patient is currently on enalapril 2.5 mg p.o. daily; however, patient informs me that he has been taking it twice a day.  He has had blood work since last office visit with his primary care provider but I do not have a copy of it to review during today's encounter.  FUNCTIONAL STATUS: No structured exercise program or daily routine.    ALLERGIES: Allergies  Allergen Reactions   Azilsartan Swelling    Avoid ARB and ACEI per MD   Advil [Ibuprofen]     Feels "jittery"    MEDICATION LIST PRIOR TO VISIT: Current Outpatient Medications on File Prior to Visit  Medication Sig Dispense Refill   allopurinol (ZYLOPRIM) 300 MG tablet Take  300 mg by mouth daily as needed.      aspirin 81 MG tablet Take 81 mg by mouth daily.      atorvastatin (LIPITOR) 40 MG tablet Take 1 tablet (40 mg total) by mouth at bedtime. 90 tablet 1   BIDIL 20-37.5 MG tablet TAKE 1 TABLET BY MOUTH THREE TIMES DAILY 90 tablet 0   Blood Glucose Monitoring Suppl (ONETOUCH VERIO FLEX SYSTEM) w/Device KIT USE TO CHECK BLOOD SUGAR TWICE DAILY     Blood Glucose Monitoring Suppl (ONETOUCH VERIO FLEX SYSTEM) w/Device KIT USE TO CHECK BLOOD SUGAR TWICE DAILY     calcium citrate-vitamin D 500-500 MG-UNIT chewable tablet Chew 1 tablet by mouth daily. 1/2 60036mt bedtime     carvedilol (COREG) 25 MG tablet Take 25 mg by mouth 2 (two) times daily with a meal.     diphenhydrAMINE (BENADRYL) 25 MG tablet Take 1 tablet (25 mg total) by mouth every 6 (six) hours. 20 tablet 0   eplerenone (INSPRA) 50 MG tablet Take 50 mg by mouth daily.  2   furosemide (LASIX) 40 MG tablet Take 40 mg by mouth 2 (two) times daily.  2   gabapentin (NEURONTIN) 300 MG capsule Take 300 mg by mouth 3 (three) times daily.   2   hydrocortisone-pramoxine (PROCTOFOAM-HC) rectal foam Place 1 applicator rectally 2 (two) times daily. (Patient taking differently: Place 1 applicator rectally as needed.) 10 g 0   metFORMIN (GLUCOPHAGE) 500 MG tablet Take 500 mg by mouth 2 (two) times daily with a meal.      NARCAN 4 MG/0.1ML LIQD nasal spray kit SMARTSIG:Both Nares  NIFEdipine (ADALAT CC) 90 MG 24 hr tablet Take 90 mg by mouth daily.     omeprazole (PRILOSEC) 20 MG capsule Take 20 mg by mouth daily.   4   ONETOUCH VERIO test strip USE TO CHECK BLOOD SUGAR TWICE DAILY     oxyCODONE-acetaminophen (PERCOCET) 10-325 MG per tablet Take 1 tablet by mouth every 6 (six) hours as needed for pain. (Patient taking differently: Take 1 tablet by mouth 4 (four) times daily.) 120 tablet 0   OZEMPIC, 0.25 OR 0.5 MG/DOSE, 2 MG/1.5ML SOPN SMARTSIG:0.25 Milligram(s) SUB-Q Once a Week     Vitamin D, Ergocalciferol,  (DRISDOL) 1.25 MG (50000 UT) CAPS capsule Take 50,000 Units by mouth. Every Friday.     No current facility-administered medications on file prior to visit.    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 ENDOSCOPY;  Service: Endoscopy;  Laterality: Left;   COLONOSCOPY N/A 04/20/2014   Procedure: COLONOSCOPY;  Surgeon: Lear Ng, MD;  Location: [facility] ENDOSCOPY;  Service: Endoscopy;  Laterality: N/A;   COLONOSCOPY W/ BIOPSIES AND POLYPECTOMY     benign   DIRECT LARYNGOSCOPY  01/05/2014   DIRECT LARYNGOSCOPY N/A 01/05/2014   Procedure: DIRECT LARYNGOSCOPY AND BIOPSY;  Surgeon: [name], MD;  Location: [facility];  Service: ENT;  Laterality: N/A;   KNEE ARTHROSCOPY Right ~ [place] CORONARY/GRAFT ANGIOGRAM N/A 12/31/2010   Procedure: LEFT AND RIGHT HEART CATHETERIZATION WITH Beatrix Fetters;  Surgeon: Laverda Page, MD;  Location: [facility] CATH LAB;  Service: Cardiovascular;  Laterality: N/A;   MULTIPLE EXTRACTIONS WITH ALVEOLOPLASTY N/A 07/08/2013   Procedure: Extraction of tooth #'s 22, 27 with alveoloplasty and bilateal mandibular facial exostoses reductions;  Surgeon: Lenn Cal, DDS;  Location: [facility] ORS;  Service: Oral Surgery;  Laterality: N/A;   MULTIPLE  TOOTH EXTRACTIONS  ~ 2008    FAMILY HISTORY: The patient's family history includes Aneurysm in his father; Cancer in his mother and sister; Diabetes in his father; Hypertension in his mother; Stroke in his mother.   SOCIAL HISTORY:  The patient  reports that he quit smoking about 7 years ago. His smoking use included cigarettes and cigars. He has a 84.00 pack-year smoking history. He has never used smokeless tobacco. He reports that he does not drink alcohol and does not use drugs.  Review of Systems  Constitutional: Negative for chills, fever and weight gain.  HENT:  Negative for hoarse voice and nosebleeds.   Eyes:  Negative for discharge, double vision and pain.  Cardiovascular:  Negative for chest pain, claudication, dyspnea on exertion, leg swelling, near-syncope, orthopnea, palpitations, paroxysmal nocturnal dyspnea and syncope.  Respiratory:  Negative for hemoptysis and shortness of breath.  Musculoskeletal:  Negative for muscle cramps and myalgias.  Gastrointestinal:  Negative for abdominal pain, constipation, diarrhea, hematemesis, hematochezia, melena, nausea and vomiting.  Neurological:  Negative for dizziness and light-headedness.  PHYSICAL EXAM: Vitals with BMI 10/08/2020 04/09/2020 01/16/2020  Height 6' 2"  6' 2"  6' 2"   Weight 322 lbs 3 oz 323 lbs 324 lbs 6 oz  BMI 41.35 58.52 77.82  Systolic 423 536 144  Diastolic 81 72 74  Pulse 85 86 73    CONSTITUTIONAL: Well-developed and well-nourished. No acute distress.  SKIN: Skin is warm and dry. No rash noted. No cyanosis. No pallor. No jaundice HEAD: Normocephalic and atraumatic.  EYES: No scleral icterus, arcus senilis MOUTH/THROAT: Moist oral membranes.  NECK: No JVD present. No thyromegaly noted. No carotid bruits  LYMPHATIC: No visible cervical adenopathy.                                                                                                     CHEST Normal respiratory effort. No intercostal retractions   LUNGS: Clear to auscultation bilaterally. No stridor. No wheezes. No rales.  CARDIOVASCULAR: Regular rate and rhythm, positive S1-S2, no murmurs rubs or gallops appreciated. ABDOMINAL: Obese, soft, nontender, nondistended, positive bowel sounds all 4 quadrants. No apparent ascites.  EXTREMITIES: Bilateral compression stockings present.  Trace bilateral peripheral edema.  HEMATOLOGIC: No significant bruising NEUROLOGIC: Oriented to person, place, and time. Nonfocal. Normal muscle tone.  PSYCHIATRIC: Normal mood and affect. Normal behavior. Cooperative  CARDIAC DATABASE: EKG: 10/08/2020: Normal sinus rhythm, 83 bpm, left axis deviation, nonspecific QRS widening, without underlying ischemia or injury pattern.  Echocardiogram: 07/26/2018: LVEF 56%, moderate LVH, grade 1 diastolic impairment, normal left atrial pressure, mildly dilated left atrium.  Stress Testing:  Lexiscan Myoview stress test 06/28/2018: 1. Lexiscan stress test was performed. Exercise capacity was not assessed. Stress symptoms included dyspnea, dizziness.  Resting blood pressure was 132/90 mmHg and peak effect blood pressure was 138/82 mmHg. The resting and stress electrocardiogram demonstrated sinus tachycardia, left anterior fascicular block, possible old posterior infarct, no resting arrhythmias and normal repolarization.  Stress EKG is non diagnostic for ischemia as it is a pharmacologic stress.  2. The overall quality of the study is fair.  Left ventricular cavity is noted to be normal on the rest and stress studies.  Gated SPECT images reveal normal myocardial thickening and wall motion.  The left ventricular ejection fraction was calculated or visually estimated to be 54%.  REST and STRESS images demonstrate decreased tracer uptake in the basal inferior and mid inferior segments of the left ventricle, likely due to tissue attenuation artifact (BMI 42). Ischemia in this region cannot be excluded. Recommend clinical correlation.   3. Low risk study.  Heart Catheterization: Coronary Angiogram  [12/31/2010]: Heart cath 12/31/10: Noral LVEF. Slow flow in coronary arteries without stenosis. Mild pulmonary HTN.   LABORATORY DATA: CBC Latest Ref Rng & Units 09/03/2016 09/03/2016 09/03/2016  WBC 4.0 - 10.5 K/uL - - -  Hemoglobin 13.0 - 17.0 g/dL 10.4(L) 9.7(L) 9.9(L)  Hematocrit 39.0 - 52.0 % 30.3(L) 28.1(L) 28.4(L)  Platelets 150 -  400 K/uL - - -    CMP Latest Ref Rng & Units 04/10/2020 09/14/2019 08/31/2016  Glucose 65 - 99 mg/dL 90 108(H) 129(H)  BUN 8 - 27 mg/dL 11 11 7   Creatinine 0.76 - 1.27 mg/dL 0.86 0.95 0.87  Sodium 134 - 144 mmol/L 143 140 139  Potassium 3.5 - 5.2 mmol/L 4.3 4.1 3.4(L)  Chloride 96 - 106 mmol/L 103 101 104  CO2 20 - 29 mmol/L 25 26 28   Calcium 8.6 - 10.2 mg/dL 9.4 9.4 8.7(L)  Total Protein 6.0 - 8.5 g/dL 6.7 - -  Total Bilirubin 0.0 - 1.2 mg/dL 0.6 - -  Alkaline Phos 44 - 121 IU/L 165(H) - -  AST 0 - 40 IU/L 15 - -  ALT 0 - 44 IU/L 16 - -    Lipid Panel     Component Value Date/Time   CHOL 138 09/14/2019 0820   TRIG 76 09/14/2019 0820   HDL 38 (L) 09/14/2019 0820   LDLCALC 85 09/14/2019 0820   LDLDIRECT 86 09/14/2019 0820   LABVLDL 15 09/14/2019 0820    No results found for: HGBA1C No components found for: NTPROBNP Lab Results  Component Value Date   TSH 1.160 04/16/2018   TSH 1.588 04/10/2017   TSH 1.089 04/11/2016    Cardiac Panel (last 3 results) No results for input(s): CKTOTAL, CKMB, TROPONINIHS, RELINDX in the last 72 hours.  IMPRESSION:    ICD-10-CM   1. Chronic heart failure with preserved ejection fraction (HCC)  I50.32 EKG 12-Lead    2. Mixed hyperlipidemia  E78.2 Lipid Panel With LDL/HDL Ratio    LDL cholesterol, direct    CMP14+EGFR    3. Essential hypertension  I10 enalapril (VASOTEC) 5 MG tablet    4. Former smoker  Z87.891     5. Type 2 diabetes mellitus with hyperglycemia, without long-term current use of insulin (HCC)  E11.65     6. OSA on  CPAP  G47.33    Z99.89     7. Obesity, Class III, BMI 40-49.9 (morbid obesity) (HCC)  E66.01        RECOMMENDATIONS: Raja Liska is a 65 y.o. male whose past medical history and cardiovascular risk factors include: Hypertension, diabetes mellitus, former tobacco use, history of throat cancer diagnosis in 2015, no significant epicardial coronary disease per angiography in 2012, chronic heart failure with preserved EF, OSA on CPAP, advanced age.  Chronic heart failure with preserved ejection fraction (HCC) Medications reconciled. Remains euvolemic and not in congestive heart failure. No hospitalizations for heart failure since last office encounter. EKG shows normal sinus rhythm, overall nonischemic. Educated on the importance of improving his modifiable cardiovascular risk factors. Low-salt diet recommended, strict I's and O's, daily weight  Mixed hyperlipidemia Tolerating statin therapy well without any side effects or intolerances. Will check CMP, fasting lipid profile at his earliest convenience. Physical examinations notable for arcus senilis and he has non-insulin-dependent diabetes  Essential hypertension Office blood pressures are very well controlled. He has been taking enalapril 2.5 mg p.o. twice daily.  We will transition him to a new prescription that once a day. Low-salt diet recommended.  Former smoker Educated on the importance of continued smoking cessation.  Type 2 diabetes mellitus with hyperglycemia, without long-term current use of insulin ([place]) Educated on importance of glycemic control given his other cardiac comorbidities. Currently managed by PCP  OSA on CPAP Patient is compliant with the CPAP machine.  Compliance recommended.  Obesity, Class III, BMI 40-49.9 (morbid obesity) (  [place]) Body mass index is 41.37 kg/m. I reviewed with the patient the importance of diet, regular physical activity/exercise, weight loss.   Patient is educated on increasing  physical activity gradually as tolerated.  With the goal of moderate intensity exercise for 30 minutes a day 5 days a week.  FINAL MEDICATION LIST END OF ENCOUNTER: Meds ordered this encounter  Medications   enalapril (VASOTEC) 5 MG tablet    Sig: Take 1 tablet (5 mg total) by mouth in the morning.    Dispense:  90 tablet    Refill:  0     Current Outpatient Medications:    allopurinol (ZYLOPRIM) 300 MG tablet, Take 300 mg by mouth daily as needed. , Disp: , Rfl:    aspirin 81 MG tablet, Take 81 mg by mouth daily. , Disp: , Rfl:    atorvastatin (LIPITOR) 40 MG tablet, Take 1 tablet (40 mg total) by mouth at bedtime., Disp: 90 tablet, Rfl: 1   BIDIL 20-37.5 MG tablet, TAKE 1 TABLET BY MOUTH THREE TIMES DAILY, Disp: 90 tablet, Rfl: 0   Blood Glucose Monitoring Suppl (ONETOUCH VERIO FLEX SYSTEM) w/Device KIT, USE TO CHECK BLOOD SUGAR TWICE DAILY, Disp: , Rfl:    Blood Glucose Monitoring Suppl (ONETOUCH VERIO FLEX SYSTEM) w/Device KIT, USE TO CHECK BLOOD SUGAR TWICE DAILY, Disp: , Rfl:    calcium citrate-vitamin D 500-500 MG-UNIT chewable tablet, Chew 1 tablet by mouth daily. 1/2 664m at bedtime, Disp: , Rfl:    carvedilol (COREG) 25 MG tablet, Take 25 mg by mouth 2 (two) times daily with a meal., Disp: , Rfl:    diphenhydrAMINE (BENADRYL) 25 MG tablet, Take 1 tablet (25 mg total) by mouth every 6 (six) hours., Disp: 20 tablet, Rfl: 0   enalapril (VASOTEC) 5 MG tablet, Take 1 tablet (5 mg total) by mouth in the morning., Disp: 90 tablet, Rfl: 0   eplerenone (INSPRA) 50 MG tablet, Take 50 mg by mouth daily., Disp: , Rfl: 2   furosemide (LASIX) 40 MG tablet, Take 40 mg by mouth 2 (two) times daily., Disp: , Rfl: 2   gabapentin (NEURONTIN) 300 MG capsule, Take 300 mg by mouth 3 (three) times daily. , Disp: , Rfl: 2   hydrocortisone-pramoxine (PROCTOFOAM-HC) rectal foam, Place 1 applicator rectally 2 (two) times daily. (Patient taking differently: Place 1 applicator rectally as needed.), Disp: 10  g, Rfl: 0   metFORMIN (GLUCOPHAGE) 500 MG tablet, Take 500 mg by mouth 2 (two) times daily with a meal. , Disp: , Rfl:    NARCAN 4 MG/0.1ML LIQD nasal spray kit, SMARTSIG:Both Nares, Disp: , Rfl:    NIFEdipine (ADALAT CC) 90 MG 24 hr tablet, Take 90 mg by mouth daily., Disp: , Rfl:    omeprazole (PRILOSEC) 20 MG capsule, Take 20 mg by mouth daily. , Disp: , Rfl: 4   ONETOUCH VERIO test strip, USE TO CHECK BLOOD SUGAR TWICE DAILY, Disp: , Rfl:    oxyCODONE-acetaminophen (PERCOCET) 10-325 MG per tablet, Take 1 tablet by mouth every 6 (six) hours as needed for pain. (Patient taking differently: Take 1 tablet by mouth 4 (four) times daily.), Disp: 120 tablet, Rfl: 0   OZEMPIC, 0.25 OR 0.5 MG/DOSE, 2 MG/1.5ML SOPN, SMARTSIG:0.25 Milligram(s) SUB-Q Once a Week, Disp: , Rfl:    Vitamin D, Ergocalciferol, (DRISDOL) 1.25 MG (50000 UT) CAPS capsule, Take 50,000 Units by mouth. Every Friday., Disp: , Rfl:   Orders Placed This Encounter  Procedures   Lipid Panel With LDL/HDL Ratio  LDL cholesterol, direct   CMP14+EGFR   EKG 12-Lead   --Continue cardiac medications as reconciled in final medication list. --Return in about 6 months (around 04/10/2021) for Follow up, heart failure management., Lipid. Or sooner if needed. --Continue follow-up with your primary care physician regarding the management of your other chronic comorbid conditions.  Patient's questions and concerns were addressed to his satisfaction. He voices understanding of the instructions provided during this encounter.   This note was created using a voice recognition software as a result there may be grammatical errors inadvertently enclosed that do not reflect the nature of this encounter. Every attempt is made to correct such errors.  Melvin Hudson, [place], [facility]  Pager: [phone] Office: [phone]

## 2020-11-06 ENCOUNTER — Encounter (HOSPITAL_COMMUNITY): Payer: Medicare HMO | Admitting: Dentistry

## 2020-11-14 LAB — CMP14+EGFR
ALT: 12 IU/L (ref 0–44)
AST: 18 IU/L (ref 0–40)
Albumin/Globulin Ratio: 2 (ref 1.2–2.2)
Albumin: 4.7 g/dL (ref 3.8–4.8)
Alkaline Phosphatase: 181 IU/L — ABNORMAL HIGH (ref 44–121)
BUN/Creatinine Ratio: 10 (ref 10–24)
BUN: 9 mg/dL (ref 8–27)
Bilirubin Total: 0.6 mg/dL (ref 0.0–1.2)
CO2: 26 mmol/L (ref 20–29)
Calcium: 9.1 mg/dL (ref 8.6–10.2)
Chloride: 102 mmol/L (ref 96–106)
Creatinine, Ser: 0.89 mg/dL (ref 0.76–1.27)
Globulin, Total: 2.4 g/dL (ref 1.5–4.5)
Glucose: 99 mg/dL (ref 70–99)
Potassium: 4.3 mmol/L (ref 3.5–5.2)
Sodium: 142 mmol/L (ref 134–144)
Total Protein: 7.1 g/dL (ref 6.0–8.5)
eGFR: 95 mL/min/{1.73_m2} (ref >59)

## 2020-11-14 LAB — LIPID PANEL WITH LDL/HDL RATIO
Cholesterol, Total: 97 mg/dL — ABNORMAL LOW (ref 100–199)
HDL: 42 mg/dL (ref >39)
LDL Chol Calc (NIH): 42 mg/dL (ref 0–99)
LDL/HDL Ratio: 1 ratio (ref 0.0–3.6)
Triglycerides: 54 mg/dL (ref 0–149)
VLDL Cholesterol Cal: 13 mg/dL (ref 5–40)

## 2020-11-14 LAB — LDL CHOLESTEROL, DIRECT: LDL Direct: 43 mg/dL (ref 0–99)

## 2020-11-16 NOTE — Progress Notes (Signed)
[facility] staff: Called and spoke to pt, pt voiced understanding.   Dr. Ayesha Rumpf, Here is a copy of his labs per Dr. Terri Skains.

## 2020-11-22 NOTE — Telephone Encounter (Signed)
complete

## 2020-12-05 ENCOUNTER — Other Ambulatory Visit: Payer: Self-pay

## 2020-12-05 ENCOUNTER — Ambulatory Visit (INDEPENDENT_AMBULATORY_CARE_PROVIDER_SITE_OTHER): Payer: Dental | Admitting: Dentistry

## 2020-12-05 ENCOUNTER — Encounter (HOSPITAL_COMMUNITY): Payer: Self-pay | Admitting: Dentistry

## 2020-12-05 VITALS — BP 127/86 | HR 75 | Temp 98.2°F

## 2020-12-05 DIAGNOSIS — C099 Malignant neoplasm of tonsil, unspecified: Secondary | ICD-10-CM

## 2020-12-05 DIAGNOSIS — K0889 Other specified disorders of teeth and supporting structures: Secondary | ICD-10-CM

## 2020-12-05 DIAGNOSIS — K08109 Complete loss of teeth, unspecified cause, unspecified class: Secondary | ICD-10-CM

## 2020-12-05 DIAGNOSIS — R634 Abnormal weight loss: Secondary | ICD-10-CM

## 2020-12-05 DIAGNOSIS — Z923 Personal history of irradiation: Secondary | ICD-10-CM

## 2020-12-05 DIAGNOSIS — Z972 Presence of dental prosthetic device (complete) (partial): Secondary | ICD-10-CM | POA: Insufficient documentation

## 2020-12-05 NOTE — Progress Notes (Signed)
Department of Dental Medicine            COMPREHENSIVE EXAM   Service Date:   12/05/2020  Patient Name:   Melvin Hudson Date of Birth:   1955/03/02 Medical Record Number: [id number]  Referring Provider:                   Teena Dunk, D.D.S.   TODAY'S VISIT:   Exam: Soft tissue:  WNL Other findings:  Healthy, well-perfused mucosa; normal salivary flow and consistency Procedures: Initial impressions for C/C dentures Plan:   After a discussion and exploring various treatment options, the following treatment plan was finalized: C/C denture fabrication Recall interval:  1 year edentulous exam  Next visit:  C/C final impressions w/ custom trays   _0 @ Progress Note:  COVID-19 SCREENING:  The patient denies symptoms concerning for COVID-19 infection including fever, chills, cough, or newly developed shortness of breath.   HISTORY OF PRESENT ILLNESS: Melvin Hudson is a very pleasant 65 y.o. 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   DENTAL HISTORY: The patient is a former patient of Dr. Enrique Sack, [place] who had seen him since 2015.  He initial presented to the hospital dental clinic for a pre-radiation dental exam and subsequently became a patient of record.  He is completely edentulous and has complete upper and lower dentures which were made back in 2017.  He has come to the clinic annually for recall exams and denture adjustments/checks.   He currently denies any dental/orofacial pain or sensitivity. Patient is able to manage oral secretions.  Patient denies dysphagia, odynophagia, dysphonia, SOB and neck pain.  Patient denies fever, rigors and malaise.   CHIEF COMPLAINT: Here to discuss new dentures.  The ones he has now he says  do not stay in or fit well anymore because of weight loss and he had them made several years ago.  He says that he is unable to open wide enough with the dentures to bite or chew on simple foods such as a hamburger. He reports that he is still very happy with the esthetics of the dentures though, including the size and shade of the teeth.   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[place]) 01/05/2014   Mouth pain 07/18/2013   Weight loss 07/18/2013   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[place]) 06/29/2013   OSA (obstructive sleep apnea) 11/19/2011   Angio-edema 09/17/2011   HTN (hypertension) 09/17/2011   Gout 09/17/2011   Past Medical History:  Diagnosis Date   Angioedema 09/17/11   tongue and lips   Arthritis    "both of my legs and feet" (01/05/2014)   CHF (congestive heart failure) (HCC)    Diabetes mellitus without complication ([place])    Erectile dysfunction 04/20/2018   GI bleed    Heart  murmur 04/20/2018   Hx of gout    Hypertension    Obesity    OSA on CPAP    Pure hypercholesterolemia 04/20/2018   S/P radiation therapy 07/26/2013-09/15/2013   Right tonsil/bilateral neck/ 7000 cGy   Squamous cell carcinoma of right tonsil ([place]) 06/14/13   Past Surgical History:  Procedure Laterality Date   COLONOSCOPY Left 04/11/2014   Procedure: COLONOSCOPY;  Surgeon: Arta Silence, MD;  Location: [facility] ENDOSCOPY;  Service: Endoscopy;  Laterality: Left;   COLONOSCOPY N/A 04/20/2014   Procedure: COLONOSCOPY;  Surgeon: Lear Ng, MD;   Location[facility] ENDOSCOPY;  Service: Endoscopy;  Laterality: N/A;   COLONOSCOPY W/ BIOPSIES AND POLYPECTOMY     benign   DIRECT LARYNGOSCOPY  01/05/2014   DIRECT LARYNGOSCOPY N/A 01/05/2014   Procedure: DIRECT LARYNGOSCOPY AND BIOPSY;  Surgeon: [name], MD;  Location: [place];  Service: ENT;  Laterality: N/A;   KNEE ARTHROSCOPY Right ~ [place] CORONARY/GRAFT ANGIOGRAM N/A 12/31/2010   Procedure: LEFT AND RIGHT HEART CATHETERIZATION WITH Beatrix Fetters;  Surgeon: Laverda Page, MD;  Location: [facility] CATH LAB;  Service: Cardiovascular;  Laterality: N/A;   MULTIPLE EXTRACTIONS WITH ALVEOLOPLASTY N/A 07/08/2013   Procedure: Extraction of tooth #'s 22, 27 with alveoloplasty and bilateal mandibular facial exostoses reductions;  Surgeon: Lenn Cal, DDS;  Location: [facility] ORS;  Service: Oral Surgery;  Laterality: N/A;   MULTIPLE TOOTH EXTRACTIONS  ~ 2008   Allergies  Allergen Reactions   Azilsartan Swelling    Avoid ARB and ACEI per MD   Advil [Ibuprofen]     Feels "jittery"   Current Outpatient Medications  Medication Sig Dispense Refill   allopurinol (ZYLOPRIM) 300 MG tablet Take 300 mg by mouth daily as needed.      aspirin 81 MG tablet Take 81 mg by mouth daily.      atorvastatin (LIPITOR) 40 MG tablet Take 1 tablet (40 mg total) by mouth at bedtime. 90 tablet 1   BIDIL 20-37.5 MG tablet TAKE 1 TABLET BY MOUTH THREE TIMES DAILY 90 tablet 0   Blood Glucose Monitoring Suppl (ONETOUCH VERIO FLEX SYSTEM) w/Device KIT USE TO CHECK BLOOD SUGAR TWICE DAILY     Blood Glucose Monitoring Suppl (ONETOUCH VERIO FLEX SYSTEM) w/Device KIT USE TO CHECK BLOOD SUGAR TWICE DAILY     calcium citrate-vitamin D 500-500 MG-UNIT chewable tablet Chew 1 tablet by mouth daily. 1/2 66m at bedtime     carvedilol (COREG) 25 MG tablet Take 25 mg by mouth 2 (two) times daily with a meal.     diphenhydrAMINE (BENADRYL) 25 MG tablet Take 1 tablet (25 mg total) by mouth  every 6 (six) hours. 20 tablet 0   enalapril (VASOTEC) 5 MG tablet Take 1 tablet (5 mg total) by mouth in the morning. 90 tablet 0   eplerenone (INSPRA) 50 MG tablet Take 50 mg by mouth daily.  2   furosemide (LASIX) 40 MG tablet Take 40 mg by mouth 2 (two) times daily.  2   gabapentin (NEURONTIN) 300 MG capsule Take 300 mg by mouth 3 (three) times daily.   2   hydrocortisone-pramoxine (PROCTOFOAM-HC) rectal foam Place 1 applicator rectally 2 (two) times daily. (Patient taking differently: Place 1 applicator rectally as needed.) 10 g 0   metFORMIN (GLUCOPHAGE) 500 MG tablet Take 500 mg by mouth 2 (two) times daily with a meal.      NARCAN 4 MG/0.1ML LIQD nasal spray kit SMARTSIG:Both Nares  NIFEdipine (ADALAT CC) 90 MG 24 hr tablet Take 90 mg by mouth daily.     omeprazole (PRILOSEC) 20 MG capsule Take 20 mg by mouth daily.   4   ONETOUCH VERIO test strip USE TO CHECK BLOOD SUGAR TWICE DAILY     oxyCODONE-acetaminophen (PERCOCET) 10-325 MG per tablet Take 1 tablet by mouth every 6 (six) hours as needed for pain. (Patient taking differently: Take 1 tablet by mouth 4 (four) times daily.) 120 tablet 0   OZEMPIC, 0.25 OR 0.5 MG/DOSE, 2 MG/1.5ML SOPN SMARTSIG:0.25 Milligram(s) SUB-Q Once a Week     Vitamin D, Ergocalciferol, (DRISDOL) 1.25 MG (50000 UT) CAPS capsule Take 50,000 Units by mouth. Every Friday.     No current facility-administered medications for this visit.    LABS: Lab Results  Component Value Date   WBC 6.7 08/31/2016   HGB 10.4 (L) 09/03/2016   HCT 30.3 (L) 09/03/2016   MCV 83.0 08/31/2016   PLT 216 08/31/2016      Component Value Date/Time   NA 142 11/13/2020 0841   NA 139 08/15/2013 1428   K 4.3 11/13/2020 0841   K 4.0 08/15/2013 1428   CL 102 11/13/2020 0841   CO2 26 11/13/2020 0841   CO2 29 08/15/2013 1428   GLUCOSE 99 11/13/2020 0841   GLUCOSE 129 (H) 08/31/2016 1320   GLUCOSE 155 (H) 08/15/2013 1428   BUN 9 11/13/2020 0841   BUN 14.3 08/15/2013 1428    CREATININE 0.89 11/13/2020 0841   CREATININE 1.2 08/15/2013 1428   CALCIUM 9.1 11/13/2020 0841   CALCIUM 9.8 08/15/2013 1428   GFRNONAA 90 04/10/2020 0824   GFRAA 104 04/10/2020 0824   Lab Results  Component Value Date   INR 1.12 08/30/2016   INR 1.25 04/18/2014   INR 1.39 04/11/2014   No results found for: PTT  Social History   Socioeconomic History   Marital status: Single    Spouse name: Not on file   Number of children: 0   Years of education: Not on file   Highest education level: Not on file  Occupational History   Not on file  Tobacco Use   Smoking status: Former    Packs/day: 2.00    Years: 42.00    Pack years: 84.00    Types: Cigarettes, Cigars    Quit date: 06/17/2013    Years since quitting: 7.4   Smokeless tobacco: Never  Vaping Use   Vaping Use: Never used  Substance and Sexual Activity   Alcohol use: Never   Drug use: No   Sexual activity: Not Currently  Other Topics Concern   Not on file  Social History Narrative   Not on file   Social Determinants of Health   Financial Resource Strain: Not on file  Food Insecurity: Not on file  Transportation Needs: Not on file  Physical Activity: Not on file  Stress: Not on file  Social Connections: Not on file  Intimate Partner Violence: Not on file   Family History  Problem Relation Age of Onset   Cancer Mother    Stroke Mother    Hypertension Mother    Diabetes Father    Aneurysm Father    Cancer Sister        breast ca    REVIEW OF SYSTEMS:  Reviewed with the patient as per HPI. Psych: Patient denies having dental phobia.   VITAL SIGNS: BP 127/86 (BP Location: Right Arm, Patient Position: Sitting, Cuff Size: Normal)   Pulse  75   Temp 98.2 F (36.8 C) (Oral)    PHYSICAL EXAM:  Soft tissue exam completed and charted.  General:  Well-developed, comfortable and in no apparent distress. Neurological:  Alert and oriented to person, place and  time. Extraoral:  Head size, head shape, facial  symmetry, skin, complexion, pigmentation, conjunctiva, oral labia, parotid gland, submandibular gland, thyroid, cervical and preauricular lymph nodes, submandibular and submental lymph nodes, tonsillar and occipital lymph nodes and supraclavicular lymph nodes WNL. No swelling or lymphadenopathy.  TMJ asymptomatic without clicks or crepitations. Intraoral:  Soft tissues appear well-perfused and mucous membranes moist.  FOM and vestibules soft and not raised. Oral cavity without mass or lesion. No signs of infection, parulis, sinus tract, edema or erythema evident upon exam.   DENTAL EXAM:  Hard tissue and periodontal exam completed and charted.     Overall impression:  Completely edentulous maxilla and mandible    Oral hygiene:  Good   Missing:  No remaining dentition Removable/Fixed Prosthodontics:  Existing maxillary and mandibular complete dentures- poor retention, both are very loose and do not mold to the patient's alveolar ridges; limited interincisal space upon maximum opening due to poor fit.  Exam performed by [facility] B. Benson Norway, DMD with Leta Speller, DAII acting as scribe.   RADIOGRAPHIC EXAM:  PAN was taken on 11/08/2019 reviewed and interpreted.  Condyles seated bilaterally in fossas.  No evidence of abnormal pathology.  All visualized osseous structures appear WNL.   ASSESSMENT:  Status post radiation therapy History of SCC of right tonsil Comprehensive dental exam Missing teeth; complete edentulism Weight loss Ill-fitting prostheses   PROCEDURES: Initial impressions for upper and lower complete dentures. Upper and lower impressions taken in alginate.  Impressions poured up with Type IV Microstone in lab.   PLAN AND RECOMMENDATIONS: I discussed the risks, benefits, and complications of various scenarios with the patient in relationship to their medical and dental conditions.  I explained all significant findings of the dental consultation with the patient including  his existing upper and lower dentures having poor retention due to his weight loss and how long it has been since he had them made, and the recommended care including fabrication of new upper and lower complete dentures in order to optimize them from a dental standpoint.  The patient verbalized understanding of all findings, discussion, and recommendations. We then discussed various treatment options to include no treatment, multiple extractions with alveoloplasty, pre-prosthetic surgery as indicated, periodontal therapy, dental restorations, root canal therapy, crown and bridge therapy, implant therapy, and replacement of missing teeth as indicated.   The patient verbalized understanding of all options, and currently wishes to proceed with the treatment plan below:  Upper and lower complete denture fabrication Recall interval: 1 year edentulous checks/oral cancer screening  Next visit:  C/C final impressions and border molding with custom trays  All questions and concerns were invited and addressed.  The patient tolerated today's visit well and departed in stable condition.       [place] Benson Norway, D.M.D.

## 2020-12-21 ENCOUNTER — Encounter (HOSPITAL_COMMUNITY): Payer: Medicare HMO | Admitting: Dentistry

## 2020-12-26 ENCOUNTER — Other Ambulatory Visit: Payer: Self-pay

## 2020-12-26 ENCOUNTER — Other Ambulatory Visit: Payer: Self-pay | Admitting: Cardiology

## 2020-12-26 ENCOUNTER — Ambulatory Visit (INDEPENDENT_AMBULATORY_CARE_PROVIDER_SITE_OTHER): Payer: Dental | Admitting: Dentistry

## 2020-12-26 ENCOUNTER — Encounter (HOSPITAL_COMMUNITY): Payer: Self-pay | Admitting: Dentistry

## 2020-12-26 VITALS — BP 120/79 | HR 75 | Temp 98.2°F

## 2020-12-26 DIAGNOSIS — I10 Essential (primary) hypertension: Secondary | ICD-10-CM

## 2020-12-26 DIAGNOSIS — K0889 Other specified disorders of teeth and supporting structures: Secondary | ICD-10-CM

## 2020-12-26 DIAGNOSIS — Z972 Presence of dental prosthetic device (complete) (partial): Secondary | ICD-10-CM

## 2020-12-26 DIAGNOSIS — K08109 Complete loss of teeth, unspecified cause, unspecified class: Secondary | ICD-10-CM

## 2020-12-26 NOTE — Progress Notes (Signed)
Department of Dental Medicine   DENTURES:  FINAL IMPRESSIONS   Service Date:   12/26/2020  Patient Name:   Melvin Hudson Date of Birth:   Mar 20, 1955 Medical Record Number: [id number]   TODAY'S VISIT:   Diagnosis Complete edentulism Procedures: C/C final impressions Plan: Next visit:  Jaw relations, tooth shade selection   12/26/2020 Progress Note:  COVID-19 SCREENING:  The patient denies symptoms concerning for COVID-19 infection including fever, chills, cough, or newly developed shortness of breath.   HISTORY OF PRESENT ILLNESS: Melvin Hudson presents today for border molding and final impressions for complete maxillary and mandibular dentures.  Medical and dental history reviewed with the patient.  No changes reported.   CHIEF COMPLAINT: Here for a routine dental appointment.  Patient with no complaints.   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   Ill-fitting dentures 12/05/2020   Former smoker 11/11/2019   Healthcare maintenance 11/11/2019   Bilateral leg edema 04/21/2018   Heart murmur 04/20/2018   Erectile dysfunction 04/20/2018   Pure hypercholesterolemia 04/20/2018   Obesity, Class III, BMI 40-49.9 (morbid obesity) ([place])    BRBPR (bright red blood per rectum) 08/30/2016   Chronic pain syndrome    History of GI diverticular bleed 04/18/2014   Hematochezia 04/10/2014   Symptomatic anemia 04/08/2014   Pharyngeal cancer ([place]) 01/05/2014   Mouth pain 07/18/2013   Weight loss 07/18/2013   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[place]) 06/29/2013   OSA (obstructive sleep apnea) 11/19/2011   Angio-edema 09/17/2011   HTN (hypertension) 09/17/2011   Gout 09/17/2011   Past Medical History:  Diagnosis Date   Angioedema 09/17/11   tongue and lips   Arthritis    "both of my legs and feet" (01/05/2014)   CHF  (congestive heart failure) (HCC)    Diabetes mellitus without complication ([place])    Erectile dysfunction 04/20/2018   GI bleed    Heart murmur 04/20/2018   Hx of gout    Hypertension    Obesity    OSA on CPAP    Pure hypercholesterolemia 04/20/2018   S/P radiation therapy 07/26/2013-09/15/2013   Right tonsil/bilateral neck/ 7000 cGy   Squamous cell carcinoma of right tonsil ([place]) 06/14/13   Current Outpatient Medications  Medication Sig Dispense Refill   allopurinol (ZYLOPRIM) 300 MG tablet Take 300 mg by mouth daily as needed.      aspirin 81 MG tablet Take 81 mg by mouth daily.      atorvastatin (LIPITOR) 40 MG tablet Take 1 tablet (40 mg total) by mouth at bedtime. 90 tablet 1   BIDIL 20-37.5 MG tablet TAKE 1 TABLET BY MOUTH THREE TIMES DAILY 90 tablet 0   Blood Glucose Monitoring Suppl (ONETOUCH VERIO FLEX SYSTEM) w/Device KIT USE TO CHECK BLOOD SUGAR TWICE DAILY     Blood Glucose Monitoring Suppl (ONETOUCH VERIO FLEX SYSTEM) w/Device KIT USE TO CHECK BLOOD SUGAR TWICE DAILY     calcium citrate-vitamin D 500-500 MG-UNIT chewable tablet Chew 1 tablet by mouth daily. 1/2 634m at bedtime     carvedilol (COREG) 25 MG tablet Take 25 mg by mouth 2 (two) times daily with a meal.     diphenhydrAMINE (BENADRYL) 25 MG tablet Take 1 tablet (25 mg total) by mouth every 6 (six) hours. 20 tablet 0   enalapril (VASOTEC) 5 MG tablet TAKE 1 TABLET(5  MG) BY MOUTH IN THE MORNING 90 tablet 0   eplerenone (INSPRA) 50 MG tablet Take 50 mg by mouth daily.  2   furosemide (LASIX) 40 MG tablet Take 40 mg by mouth 2 (two) times daily.  2   gabapentin (NEURONTIN) 300 MG capsule Take 300 mg by mouth 3 (three) times daily.   2   hydrocortisone-pramoxine (PROCTOFOAM-HC) rectal foam Place 1 applicator rectally 2 (two) times daily. (Patient taking differently: Place 1 applicator rectally as needed.) 10 g 0   metFORMIN (GLUCOPHAGE) 500 MG tablet Take 500 mg by mouth 2 (two) times daily with a meal.      NARCAN 4 MG/0.1ML  LIQD nasal spray kit SMARTSIG:Both Nares     NIFEdipine (ADALAT CC) 90 MG 24 hr tablet Take 90 mg by mouth daily.     omeprazole (PRILOSEC) 20 MG capsule Take 20 mg by mouth daily.   4   ONETOUCH VERIO test strip USE TO CHECK BLOOD SUGAR TWICE DAILY     oxyCODONE-acetaminophen (PERCOCET) 10-325 MG per tablet Take 1 tablet by mouth every 6 (six) hours as needed for pain. (Patient taking differently: Take 1 tablet by mouth 4 (four) times daily.) 120 tablet 0   OZEMPIC, 0.25 OR 0.5 MG/DOSE, 2 MG/1.5ML SOPN SMARTSIG:0.25 Milligram(s) SUB-Q Once a Week     Vitamin D, Ergocalciferol, (DRISDOL) 1.25 MG (50000 UT) CAPS capsule Take 50,000 Units by mouth. Every Friday.     No current facility-administered medications for this visit.   Allergies  Allergen Reactions   Azilsartan Swelling    Avoid ARB and ACEI per MD   Advil [Ibuprofen]     Feels "jittery"     VITALS: BP 120/79 (BP Location: Right Arm, Patient Position: Sitting, Cuff Size: Large)   Pulse 75   Temp 98.2 F (36.8 C) (Oral)    ASSESSMENT: Completely edentulous maxilla and mandible   PROCEDURES:  Border molding and final impressions for upper and lower complete dentures.  Patient rinsed with 0.12% chlorhexidine gluconate prior to procedure. Customs trays were seated and adjusted as needed.   Edentulous regions and posterior palatal seal were border molded with Heavy Body Genie PVS material. Excess material trimmed away. Reseated custom trays to verify border molding and proper seating. PVS adhesive was added to the intaglio surfaces of custom trays.   Medium Body Genie material was added to the custom trays.  Trays were subsequently seated and allowed to set.   Removed custom trays and evaluated for accuracy.  Final impressions appear to have captured all important anatomy and thus deemed adequate.   PLAN: Next visit:  Jaw relations, tooth shade selection   All questions and concerns were invited and addressed.  The  patient tolerated today's visit well and departed in stable condition.  [place] Benson Norway, D.M.D.

## 2021-01-08 ENCOUNTER — Encounter (HOSPITAL_COMMUNITY): Payer: Self-pay | Admitting: Dentistry

## 2021-01-08 ENCOUNTER — Other Ambulatory Visit: Payer: Self-pay

## 2021-01-08 ENCOUNTER — Ambulatory Visit (INDEPENDENT_AMBULATORY_CARE_PROVIDER_SITE_OTHER): Payer: Dental | Admitting: Dentistry

## 2021-01-08 VITALS — BP 116/69 | HR 75 | Temp 98.6°F

## 2021-01-08 DIAGNOSIS — K0889 Other specified disorders of teeth and supporting structures: Secondary | ICD-10-CM

## 2021-01-08 DIAGNOSIS — Z972 Presence of dental prosthetic device (complete) (partial): Secondary | ICD-10-CM

## 2021-01-08 DIAGNOSIS — K08109 Complete loss of teeth, unspecified cause, unspecified class: Secondary | ICD-10-CM

## 2021-01-08 NOTE — Progress Notes (Signed)
Department of Dental Medicine     DENTURES:  JAW RELATIONS   Service Date:   01/08/2021  Patient Name:   Melvin Hudson Date of Birth:   July 25, 1955 Medical Record Number: [id number]   TODAY'S VISIT   Diagnosis Complete edentulism Procedures: C/C jaw relations & tooth shade selection Plan: Next visit:  Wax try-in   01/08/2021 Progress Note:  COVID-19 SCREENING:  The patient denies symptoms concerning for COVID-19 infection including fever, chills, cough, or newly developed shortness of breath.   HISTORY OF PRESENT ILLNESS: Melvin Hudson presents today for complete maxillary and mandibular dentures jaw relations/MMR appointment.  Medical and dental history reviewed with the patient.  No changes reported.   CHIEF COMPLAINT:   Here for a routine dental appointment; patient with no complaints.   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   Ill-fitting dentures 12/05/2020   Former smoker 11/11/2019   Healthcare maintenance 11/11/2019   Bilateral leg edema 04/21/2018   Heart murmur 04/20/2018   Erectile dysfunction 04/20/2018   Pure hypercholesterolemia 04/20/2018   Obesity, Class III, BMI 40-49.9 (morbid obesity) ([place])    BRBPR (bright red blood per rectum) 08/30/2016   Chronic pain syndrome    History of GI diverticular bleed 04/18/2014   Hematochezia 04/10/2014   Symptomatic anemia 04/08/2014   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   Mouth pain 07/18/2013   Weight loss 07/18/2013   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[place]) 06/29/2013   OSA (obstructive sleep apnea) 11/19/2011   Angio-edema 09/17/2011   HTN (hypertension) 09/17/2011   Gout 09/17/2011   Past Medical History:  Diagnosis Date   Angioedema 09/17/11   tongue and lips   Arthritis    "both of my legs and feet" (01/05/2014)   CHF (congestive heart  failure) (HCC)    Diabetes mellitus without complication ([place])    Erectile dysfunction 04/20/2018   GI bleed    Heart murmur 04/20/2018   Hx of gout    Hypertension    Obesity    OSA on CPAP    Pure hypercholesterolemia 04/20/2018   S/P radiation therapy 07/26/2013-09/15/2013   Right tonsil/bilateral neck/ 7000 cGy   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      aspirin 81 MG tablet Take 81 mg by mouth daily.      atorvastatin (LIPITOR) 40 MG tablet Take 1 tablet (40 mg total) by mouth at bedtime. 90 tablet 1   BIDIL 20-37.5 MG tablet TAKE 1 TABLET BY MOUTH THREE TIMES DAILY 90 tablet 0   Blood Glucose Monitoring Suppl (ONETOUCH VERIO FLEX SYSTEM) w/Device KIT USE TO CHECK BLOOD SUGAR TWICE DAILY     Blood Glucose Monitoring Suppl (ONETOUCH VERIO FLEX SYSTEM) w/Device KIT USE TO CHECK BLOOD SUGAR TWICE DAILY     calcium citrate-vitamin D 500-500 MG-UNIT chewable tablet Chew 1 tablet by mouth daily. 1/2 678m at bedtime     carvedilol (COREG) 25 MG tablet Take 25 mg by mouth 2 (two) times daily with a meal.     diphenhydrAMINE (BENADRYL) 25 MG tablet Take 1 tablet (25 mg total) by mouth every 6 (six) hours. 20 tablet 0   enalapril (VASOTEC) 5 MG tablet TAKE 1  TABLET(5 MG) BY MOUTH IN THE MORNING 90 tablet 0   eplerenone (INSPRA) 50 MG tablet Take 50 mg by mouth daily.  2   furosemide (LASIX) 40 MG tablet Take 40 mg by mouth 2 (two) times daily.  2   gabapentin (NEURONTIN) 300 MG capsule Take 300 mg by mouth 3 (three) times daily.   2   hydrocortisone-pramoxine (PROCTOFOAM-HC) rectal foam Place 1 applicator rectally 2 (two) times daily. (Patient taking differently: Place 1 applicator rectally as needed.) 10 g 0   metFORMIN (GLUCOPHAGE) 500 MG tablet Take 500 mg by mouth 2 (two) times daily with a meal.      NARCAN 4 MG/0.1ML LIQD nasal spray  kit SMARTSIG:Both Nares     NIFEdipine (ADALAT CC) 90 MG 24 hr tablet Take 90 mg by mouth daily.     omeprazole (PRILOSEC) 20 MG capsule Take 20 mg by mouth daily.   4   ONETOUCH VERIO test strip USE TO CHECK BLOOD SUGAR TWICE DAILY     oxyCODONE-acetaminophen (PERCOCET) 10-325 MG per tablet Take 1 tablet by mouth every 6 (six) hours as needed for pain. (Patient taking differently: Take 1 tablet by mouth 4 (four) times daily.) 120 tablet 0   OZEMPIC, 0.25 OR 0.5 MG/DOSE, 2 MG/1.5ML SOPN SMARTSIG:0.25 Milligram(s) SUB-Q Once a Week     Vitamin D, Ergocalciferol, (DRISDOL) 1.25 MG (50000 UT) CAPS capsule Take 50,000 Units by mouth. Every Friday.     No current facility-administered medications for this visit.   Allergies  Allergen Reactions   Azilsartan Swelling    Avoid ARB and ACEI per MD   Advil [Ibuprofen]     Feels "jittery"     VITALS: BP 116/69 (BP Location: Right Arm, Patient Position: Sitting, Cuff Size: Large)   Pulse 75   Temp 98.6 F (37 C) (Oral)    ASSESSMENT: Completely edentulous, ill-fitting prostheses    PROCEDURES: Jaw relations for upper and lower complete dentures.  Tried in maxillary and mandibular record bases with occlusal rim.  Verified maxillary occlusal plane with Foxplane and adjusted until parallel with ala-tragus line.   Adjusted maxillary and mandibular rims until bilateral occlusion, appropriate lip support and appropriate incisal edge positions were achieved.   Marked maxillary midline based on nasolabial fold.  Verified even distribution of occlusal forces along edentulous areas.  Adjusted the record bases until the patient was comfortable with fit and speech.   Placed notches in the occlusal rims and recorded bite in C.R. with [employer]ite [employer] material. Picked out tooth shade B2 which was verified by the patient.     PLAN: Next visit:  C/C wax try-in  All questions and concerns were invited and addressed.  The patient tolerated  today's visit well and departed in stable condition.     [place] Benson Norway, D.M.D.

## 2021-01-17 ENCOUNTER — Telehealth: Payer: Self-pay | Admitting: Cardiology

## 2021-01-17 NOTE — Telephone Encounter (Signed)
Pt is calling asking to speak with a MA regarding his activity and if he has limitations

## 2021-01-23 ENCOUNTER — Encounter (HOSPITAL_COMMUNITY): Payer: Medicare HMO | Admitting: Dentistry

## 2021-01-24 ENCOUNTER — Ambulatory Visit (INDEPENDENT_AMBULATORY_CARE_PROVIDER_SITE_OTHER): Payer: Medicare HMO | Admitting: Dentistry

## 2021-01-24 ENCOUNTER — Other Ambulatory Visit: Payer: Self-pay

## 2021-01-24 DIAGNOSIS — K08109 Complete loss of teeth, unspecified cause, unspecified class: Secondary | ICD-10-CM

## 2021-01-25 NOTE — Progress Notes (Signed)
Department of Dental Medicine          DENTURES: WAX TRY-IN   Service Date:   01/24/2021  Patient Name:   Melvin Hudson Date of Birth:   08/22/1955 Medical Record Number: [id number]   TODAY'S VISIT   Procedures C/C wax-try-in Plan: Next visit:  C/C denture delivery   01/25/2021 Progress Note:   HISTORY OF PRESENT ILLNESS: Demitrus Francisco presents today for complete maxillary and mandibular wax try-in appointment. Medical and dental history reviewed with the patient.  No changes reported.   CHIEF COMPLAINT:   Patient with no complaints; here for a routine dental appointment.   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   Ill-fitting dentures 12/05/2020   Former smoker 11/11/2019   Healthcare maintenance 11/11/2019   Bilateral leg edema 04/21/2018   Heart murmur 04/20/2018   Erectile dysfunction 04/20/2018   Pure hypercholesterolemia 04/20/2018   Obesity, Class III, BMI 40-49.9 (morbid obesity) ([place])    BRBPR (bright red blood per rectum) 08/30/2016   Chronic pain syndrome    History of GI diverticular bleed 04/18/2014   Hematochezia 04/10/2014   Symptomatic anemia 04/08/2014   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   Mouth pain 07/18/2013   Weight loss 07/18/2013   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[place]) 06/29/2013   OSA (obstructive sleep apnea) 11/19/2011   Angio-edema 09/17/2011   HTN (hypertension) 09/17/2011   Gout 09/17/2011   Past Medical History:  Diagnosis Date   Angioedema 09/17/11   tongue and lips   Arthritis    "both of my legs and feet" (01/05/2014)   CHF (congestive heart failure) (HCC)    Diabetes mellitus without complication ([place])    Erectile dysfunction 04/20/2018   GI bleed    Heart murmur 04/20/2018   Hx of gout    Hypertension    Obesity    OSA on CPAP    Pure hypercholesterolemia  04/20/2018   S/P radiation therapy 07/26/2013-09/15/2013   Right tonsil/bilateral neck/ 7000 cGy   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      aspirin 81 MG tablet Take 81 mg by mouth daily.      atorvastatin (LIPITOR) 40 MG tablet Take 1 tablet (40 mg total) by mouth at bedtime. 90 tablet 1   BIDIL 20-37.5 MG tablet TAKE 1 TABLET BY MOUTH THREE TIMES DAILY 90 tablet 0   Blood Glucose Monitoring Suppl (ONETOUCH VERIO FLEX SYSTEM) w/Device KIT USE TO CHECK BLOOD SUGAR TWICE DAILY     Blood Glucose Monitoring Suppl (ONETOUCH VERIO FLEX SYSTEM) w/Device KIT USE TO CHECK BLOOD SUGAR TWICE DAILY     calcium citrate-vitamin D 500-500 MG-UNIT chewable tablet Chew 1 tablet by mouth daily. 1/2 642m at bedtime     carvedilol (COREG) 25 MG tablet Take 25 mg by mouth 2 (two) times daily with a meal.     diphenhydrAMINE (BENADRYL) 25 MG tablet Take 1 tablet (25 mg total) by mouth every 6 (six) hours. 20 tablet 0   enalapril (VASOTEC) 5 MG tablet TAKE 1 TABLET(5 MG) BY MOUTH IN THE MORNING 90 tablet 0   eplerenone (INSPRA) 50 MG tablet Take 50 mg by mouth daily.  2  furosemide (LASIX) 40 MG tablet Take 40 mg by mouth 2 (two) times daily.  2   gabapentin (NEURONTIN) 300 MG capsule Take 300 mg by mouth 3 (three) times daily.   2   hydrocortisone-pramoxine (PROCTOFOAM-HC) rectal foam Place 1 applicator rectally 2 (two) times daily. (Patient taking differently: Place 1 applicator rectally as needed.) 10 g 0   metFORMIN (GLUCOPHAGE) 500 MG tablet Take 500 mg by mouth 2 (two) times daily with a meal.      NARCAN 4 MG/0.1ML LIQD nasal spray kit SMARTSIG:Both Nares     NIFEdipine (ADALAT CC) 90 MG 24 hr tablet Take 90 mg by mouth daily.     omeprazole (PRILOSEC) 20 MG capsule Take 20 mg by mouth daily.   4   ONETOUCH VERIO test strip USE TO CHECK BLOOD SUGAR  TWICE DAILY     oxyCODONE-acetaminophen (PERCOCET) 10-325 MG per tablet Take 1 tablet by mouth every 6 (six) hours as needed for pain. (Patient taking differently: Take 1 tablet by mouth 4 (four) times daily.) 120 tablet 0   OZEMPIC, 0.25 OR 0.5 MG/DOSE, 2 MG/1.5ML SOPN SMARTSIG:0.25 Milligram(s) SUB-Q Once a Week     Vitamin D, Ergocalciferol, (DRISDOL) 1.25 MG (50000 UT) CAPS capsule Take 50,000 Units by mouth. Every Friday.     No current facility-administered medications for this visit.   Allergies  Allergen Reactions   Azilsartan Swelling    Avoid ARB and ACEI per MD   Advil [Ibuprofen]     Feels "jittery"    VITALS: BP 108/72 (BP Location: Right Arm, Patient Position: Sitting, Cuff Size: Large)   Pulse 72   Temp 98.3 F (36.8 C) (Oral)    ASSESSMENT: Completely edentulous   PROCEDURES: Wax-try in for upper and lower complete dentures. Tried in maxillary and mandibular wax try-in dentures. Verified plane of occlusion.  Maxillary plane of occlusion parallel with ala-tragus line.  No rocking of the wax dentures noted.   Guided the patient into CR and no interferences noted.  Bilateral balanced occlusion verified with Accufilm. Patient was given a mirror for input on overall appearance and fit of denture try-in.  The patient is satisfied with the denture(s) teeth shape, size, color/shade, and overall appearance and fit of the the wax try-in dentures. Gingival shade L-199 Dark was picked out and verified by the patient.   PLAN: Next visit:  C/C denture delivery  All questions and concerns were invited and addressed.  The patient tolerated today's visit well and departed in stable condition.     [place] Benson Norway, D.M.D.

## 2021-02-04 ENCOUNTER — Encounter (HOSPITAL_COMMUNITY): Payer: Medicare HMO | Admitting: Dentistry

## 2021-02-05 ENCOUNTER — Other Ambulatory Visit: Payer: Self-pay

## 2021-02-05 ENCOUNTER — Ambulatory Visit (INDEPENDENT_AMBULATORY_CARE_PROVIDER_SITE_OTHER): Payer: Dental | Admitting: Dentistry

## 2021-02-05 VITALS — BP 107/71 | HR 64 | Temp 98.1°F

## 2021-02-05 DIAGNOSIS — K08109 Complete loss of teeth, unspecified cause, unspecified class: Secondary | ICD-10-CM

## 2021-02-05 DIAGNOSIS — Z972 Presence of dental prosthetic device (complete) (partial): Secondary | ICD-10-CM

## 2021-02-05 DIAGNOSIS — K0889 Other specified disorders of teeth and supporting structures: Secondary | ICD-10-CM

## 2021-02-05 NOTE — Progress Notes (Addendum)
Department of Dental Medicine   Service Date:   02/05/2021  Patient Name:  Kentrell Hallahan Date of Birth:   12/16/1955 Medical Record Number: [id number]   TODAY'S VISIT: DENTURE DELIVERY   DIAGNOSIS: Completely edentulous; ill-fitting existing prosthesis PROCEDURES: C/C denture delivery PLAN: Next visit:  Denture adjustment    02/05/2021 PROGRESS NOTE:    COVID-19 SCREENING:  The patient denies symptoms concerning for COVID-19 infection including fever, chills, cough, or newly developed shortness of breath.   HISTORY OF PRESENT ILLNESS: Phillips Goulette presents today for complete upper and lower denture delivery.  Medical and dental history reviewed with the patient.  No changes reported.    CHIEF COMPLAINT:   Here for a routine appointment; patient with no complaints.   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   Ill-fitting dentures 12/05/2020   Former smoker 11/11/2019   Healthcare maintenance 11/11/2019   Bilateral leg edema 04/21/2018   Heart murmur 04/20/2018   Erectile dysfunction 04/20/2018   Pure hypercholesterolemia 04/20/2018   Obesity, Class III, BMI 40-49.9 (morbid obesity) ([place])    BRBPR (bright red blood per rectum) 08/30/2016   Chronic pain syndrome    History of GI diverticular bleed 04/18/2014   Hematochezia 04/10/2014   Symptomatic anemia 04/08/2014   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   Mouth pain 07/18/2013   Weight loss 07/18/2013   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[place]) 06/29/2013   OSA (obstructive sleep apnea) 11/19/2011   Angio-edema 09/17/2011   HTN (hypertension) 09/17/2011   Gout 09/17/2011   Past Medical History:  Diagnosis Date   Angioedema 09/17/11   tongue and lips   Arthritis    "both of my legs and feet" (01/05/2014)   CHF (congestive heart failure) (HCC)    Diabetes  mellitus without complication ([place])    Erectile dysfunction 04/20/2018   GI bleed    Heart murmur 04/20/2018   Hx of gout    Hypertension    Obesity    OSA on CPAP    Pure hypercholesterolemia 04/20/2018   S/P radiation therapy 07/26/2013-09/15/2013   Right tonsil/bilateral neck/ 7000 cGy   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      aspirin 81 MG tablet Take 81 mg by mouth daily.      atorvastatin (LIPITOR) 40 MG tablet Take 1 tablet (40 mg total) by mouth at bedtime. 90 tablet 1   BIDIL 20-37.5 MG tablet TAKE 1 TABLET BY MOUTH THREE TIMES DAILY 90 tablet 0   Blood Glucose Monitoring Suppl (ONETOUCH VERIO FLEX SYSTEM) w/Device KIT USE TO CHECK BLOOD SUGAR TWICE DAILY     Blood Glucose Monitoring Suppl (ONETOUCH VERIO FLEX SYSTEM) w/Device KIT USE TO CHECK BLOOD SUGAR TWICE DAILY     calcium citrate-vitamin D 500-500 MG-UNIT chewable tablet Chew 1 tablet by mouth daily. 1/2 644m at bedtime     carvedilol (COREG) 25 MG tablet Take 25 mg by mouth 2 (two) times daily with a meal.     diphenhydrAMINE (BENADRYL) 25 MG tablet Take 1 tablet (25 mg total) by mouth every 6 (six) hours. 20 tablet 0   enalapril (VASOTEC) 5 MG tablet TAKE 1 TABLET(5 MG) BY MOUTH IN THE MORNING 90 tablet 0  eplerenone (INSPRA) 50 MG tablet Take 50 mg by mouth daily.  2   furosemide (LASIX) 40 MG tablet Take 40 mg by mouth 2 (two) times daily.  2   gabapentin (NEURONTIN) 300 MG capsule Take 300 mg by mouth 3 (three) times daily.   2   hydrocortisone-pramoxine (PROCTOFOAM-HC) rectal foam Place 1 applicator rectally 2 (two) times daily. (Patient taking differently: Place 1 applicator rectally as needed.) 10 g 0   metFORMIN (GLUCOPHAGE) 500 MG tablet Take 500 mg by mouth 2 (two) times daily with a meal.      NARCAN 4 MG/0.1ML LIQD nasal spray kit SMARTSIG:Both Nares      NIFEdipine (ADALAT CC) 90 MG 24 hr tablet Take 90 mg by mouth daily.     omeprazole (PRILOSEC) 20 MG capsule Take 20 mg by mouth daily.   4   ONETOUCH VERIO test strip USE TO CHECK BLOOD SUGAR TWICE DAILY     oxyCODONE-acetaminophen (PERCOCET) 10-325 MG per tablet Take 1 tablet by mouth every 6 (six) hours as needed for pain. (Patient taking differently: Take 1 tablet by mouth 4 (four) times daily.) 120 tablet 0   OZEMPIC, 0.25 OR 0.5 MG/DOSE, 2 MG/1.5ML SOPN SMARTSIG:0.25 Milligram(s) SUB-Q Once a Week     Vitamin D, Ergocalciferol, (DRISDOL) 1.25 MG (50000 UT) CAPS capsule Take 50,000 Units by mouth. Every Friday.     No current facility-administered medications for this visit.   Allergies  Allergen Reactions   Azilsartan Swelling    Avoid ARB and ACEI per MD   Advil [Ibuprofen]     Feels "jittery"    VITALS: BP 107/71 (BP Location: Right Arm, Patient Position: Sitting, Cuff Size: Large)   Pulse 64   Temp 98.1 F (36.7 C) (Oral)    ASSESSMENT: Completely edentulous Ill-fitting prostheses   PROCEDURES: Maxillary and mandibular complete denture delivery.   Maxillary and mandibular dentures seated. No rocking noted. Applied PIP paste and seated maxillary and mandibular dentures.  Noted heavy spots and adjusted with acrylic bur as needed. Guided patient's mandible into CR and verified occlusion using Accufilm. Adjusted occlusion as needed until bilateral balanced occlusion achieved. Polished areas where adjustments were made. Good esthetics, phonetics, overall retention and function noted. Had patient demonstrate placing and removing dentures. Discussed home care with the patient including brushing with dish soap with denture toothbrush in the evenings and storing in a bowl of water at night.  Restrictions and limitations of dentures reviewed with the patient in verbal and written format. Patient verbalized understanding of instructions and is satisfied with overall appearance and fit  of dentures.   Denture toothbrush and container given to patient.   PLAN: Next visit:  Denture adjustment Call should any questions or concerns arise.  All questions and concerns were invited and addressed.  The patient tolerated today's visit well and departed in stable condition.     [place] Benson [place], D.M.D.

## 2021-02-05 NOTE — Patient Instructions (Signed)
[facility] Department of Dental Medicine Dr. Abhi Moccia B. Teagan Ozawa, D.M.D. Phone: [phone] Fax: [phone]    Congratulations, you are on the way to oral rehabilitation!  You have just received a new set of complete or partial dentures.  Dentures give you many of the benefits of a full set of teeth.  They help restore your ability to bite and chew food.  They can help you speak more clearly.  And having dentures can improve the shape of your jaw line and give you a natural smile.    Getting used to dentures can take a while.  At first, you will be aware of a new feeling in your mouth.  Biting and chewing on your food and even talking may seem a little different.  But soon they will feel like a natural part of your mouth.  These instructions will help you get adjusted to your dentures as well as care for them properly.  Please read these instructions carefully and completely as soon as you get home.  If you or your caregiver have any questions please call the [facility] at [phone].    INSTRUCTIONS FOR DENTURE USE AND CARE   How Will Your Dentures Look and Feel? Soon after you begin wearing your dentures, you may feel that your dentures are too large or even loose.  As our mouth and facial muscles become accustomed to the dentures, these feelings will go away.   You also may feel that you are salivating more than you normally do.  This feeling should go away as you get used to having the dentures in your mouth.   You may bite your cheek or your tongue; this will eventually resolve itself as you wear your dentures.  Some soreness is to be expected, but you should not hurt.  If your mouth hurts, call your dentist. A denture adhesive may occasionally be necessary to hold your dentures in place more securely.  The dentist will let you know when one is recommended for you.  SPEAKING:  Wearing dentures will change the sound of your voice  initially.  This will be noticed by you more than anyone else.   Bite and swallow before you speak, in order to place your dentures in position so that you may speak more clearly.   Practice speaking by reading aloud or counting from 1 to 100 very slowly and distinctly.   After some practice, your mouth will become accustomed to your dentures and you will speak more clearly.  EATING:  Chewing will definitely feel different after you receive your dentures.  With a little practice and patience, you should be able to eat just about any kind of food.   Begin by eating small quantities of food that are cut into small pieces.  Start with soft foods such as eggs, cooked vegetables, or puddings.   As you gain confidence, try more foods.   How Do You Care for Your Dentures?   Dentures can collect plaque and calculus much the same as natural teeth can.  If not removed on a regular basis, your dentures will not look or feel clean, and you will experience denture odor.  It is very important that you remove your dentures at bedtime and clean them thoroughly.    YOU SHOULD: Clean your dentures over a sink full of water so if dropped, they will not break.  You can also stand over a folded towel. Rinse your dentures with   cool water to remove any large food particles. Use soap and water or a denture cleanser or paste to clean the dentures.  Do not use regular toothpaste as it may abrade the denture base or teeth. Use a moistened denture brush to clean all surfaces (inside and outside). Rinse thoroughly to remove any remaining soap or denture cleanser. Use a soft bristle toothbrush to gently brush any natural teeth, gums, tongue, and palate at bedtime and before reinserting your dentures. Do not sleep with your dentures in your mouth at night.  Remove your dentures and soak them overnight in a denture cup filled with water or denture solution as recommended by your dentist.  This routine will become second nature  and will increase the life and comfort of your dentures.     When Should You Call for Help?  Watch closely for changes in your health, and be sure to contact your dentist if: Your dentures do not fit well. You have sores in your mouth. Your dentures cause pain.   FOLLOW-UP:  Your dentist will adjust your dentures from time to time to help them continue to fit well and to see you at least once a year for a check-up and examination. Please do not try to adjust your dentures yourself; you could damage them.    

## 2021-02-19 ENCOUNTER — Encounter (HOSPITAL_COMMUNITY): Payer: Medicare HMO | Admitting: Dentistry

## 2021-02-22 ENCOUNTER — Encounter (HOSPITAL_COMMUNITY): Payer: Medicare HMO | Admitting: Dentistry

## 2021-02-26 ENCOUNTER — Encounter (HOSPITAL_COMMUNITY): Payer: Medicare HMO | Admitting: Dentistry

## 2021-03-01 ENCOUNTER — Ambulatory Visit (INDEPENDENT_AMBULATORY_CARE_PROVIDER_SITE_OTHER): Payer: Dental | Admitting: Dentistry

## 2021-03-01 ENCOUNTER — Other Ambulatory Visit: Payer: Self-pay

## 2021-03-01 VITALS — BP 122/71 | HR 61 | Temp 98.1°F

## 2021-03-01 DIAGNOSIS — K08109 Complete loss of teeth, unspecified cause, unspecified class: Secondary | ICD-10-CM

## 2021-03-01 DIAGNOSIS — K0889 Other specified disorders of teeth and supporting structures: Secondary | ICD-10-CM

## 2021-03-01 DIAGNOSIS — Z972 Presence of dental prosthetic device (complete) (partial): Secondary | ICD-10-CM

## 2021-03-01 NOTE — Progress Notes (Signed)
Department of Dental Medicine   Service Date:    03/01/2021  Patient Name:   Melvin Hudson Date of Birth:    04/27/1955 Medical Record Number:  [id number]   TODAY'S VISIT: DENTURE ADJUSTMENT   PROCEDURES: C/C denture adjustment PLAN: Next visit:  Adjustment in 1 week       03/01/2021 PROGRESS NOTE:    COVID-19 SCREENING:  The patient denies symptoms concerning for COVID-19 infection including fever, chills, cough, or newly developed shortness of breath.   HISTORY OF PRESENT ILLNESS: Melvin Hudson presents today for maxillary and mandibular complete denture adjustment.  Delivery of dentures was on 02/05/2021.   Medical and dental history reviewed with the patient.  No reported changes.   CHIEF COMPLAINT:   The patient reports being unable to wear his lower denture due to pain/sore spot.   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   Ill-fitting dentures 12/05/2020   Former smoker 11/11/2019   Healthcare maintenance 11/11/2019   Bilateral leg edema 04/21/2018   Heart murmur 04/20/2018   Erectile dysfunction 04/20/2018   Pure hypercholesterolemia 04/20/2018   Obesity, Class III, BMI 40-49.9 (morbid obesity) ([place])    BRBPR (bright red blood per rectum) 08/30/2016   Chronic pain syndrome    History of GI diverticular bleed 04/18/2014   Hematochezia 04/10/2014   Symptomatic anemia 04/08/2014   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   Mouth pain 07/18/2013   Weight loss 07/18/2013   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[place]) 06/29/2013   OSA (obstructive sleep apnea) 11/19/2011   Angio-edema 09/17/2011   HTN (hypertension) 09/17/2011   Gout 09/17/2011   Past Medical History:  Diagnosis Date   Angioedema 09/17/11   tongue and lips   Arthritis    "both of my legs and feet" (01/05/2014)   CHF (congestive heart failure) (HCC)     Diabetes mellitus without complication ([place])    Erectile dysfunction 04/20/2018   GI bleed    Heart murmur 04/20/2018   Hx of gout    Hypertension    Obesity    OSA on CPAP    Pure hypercholesterolemia 04/20/2018   S/P radiation therapy 07/26/2013-09/15/2013   Right tonsil/bilateral neck/ 7000 cGy   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      aspirin 81 MG tablet Take 81 mg by mouth daily.      atorvastatin (LIPITOR) 40 MG tablet Take 1 tablet (40 mg total) by mouth at bedtime. 90 tablet 1   BIDIL 20-37.5 MG tablet TAKE 1 TABLET BY MOUTH THREE TIMES DAILY 90 tablet 0   Blood Glucose Monitoring Suppl (ONETOUCH VERIO FLEX SYSTEM) w/Device KIT USE TO CHECK BLOOD SUGAR TWICE DAILY     Blood Glucose Monitoring Suppl (ONETOUCH VERIO FLEX SYSTEM) w/Device KIT USE TO CHECK BLOOD SUGAR TWICE DAILY     calcium citrate-vitamin D 500-500 MG-UNIT chewable tablet Chew 1 tablet by mouth daily. 1/2 635m at bedtime     carvedilol (COREG) 25 MG tablet Take 25 mg by mouth 2 (two) times daily with a meal.     diphenhydrAMINE (BENADRYL) 25 MG tablet Take 1 tablet (25 mg total) by mouth every 6 (six) hours. 20 tablet 0   enalapril (VASOTEC)  5 MG tablet TAKE 1 TABLET(5 MG) BY MOUTH IN THE MORNING 90 tablet 0   eplerenone (INSPRA) 50 MG tablet Take 50 mg by mouth daily.  2   furosemide (LASIX) 40 MG tablet Take 40 mg by mouth 2 (two) times daily.  2   gabapentin (NEURONTIN) 300 MG capsule Take 300 mg by mouth 3 (three) times daily.   2   hydrocortisone-pramoxine (PROCTOFOAM-HC) rectal foam Place 1 applicator rectally 2 (two) times daily. (Patient taking differently: Place 1 applicator rectally as needed.) 10 g 0   metFORMIN (GLUCOPHAGE) 500 MG tablet Take 500 mg by mouth 2 (two) times daily with a meal.      NARCAN 4 MG/0.1ML LIQD nasal spray kit SMARTSIG:Both  Nares     NIFEdipine (ADALAT CC) 90 MG 24 hr tablet Take 90 mg by mouth daily.     omeprazole (PRILOSEC) 20 MG capsule Take 20 mg by mouth daily.   4   ONETOUCH VERIO test strip USE TO CHECK BLOOD SUGAR TWICE DAILY     oxyCODONE-acetaminophen (PERCOCET) 10-325 MG per tablet Take 1 tablet by mouth every 6 (six) hours as needed for pain. (Patient taking differently: Take 1 tablet by mouth 4 (four) times daily.) 120 tablet 0   OZEMPIC, 0.25 OR 0.5 MG/DOSE, 2 MG/1.5ML SOPN SMARTSIG:0.25 Milligram(s) SUB-Q Once a Week     Vitamin D, Ergocalciferol, (DRISDOL) 1.25 MG (50000 UT) CAPS capsule Take 50,000 Units by mouth. Every Friday.     No current facility-administered medications for this visit.   Allergies  Allergen Reactions   Azilsartan Swelling    Avoid ARB and ACEI per MD   Advil [Ibuprofen]     Feels "jittery"     VITALS: BP 122/71 (BP Location: Right Arm, Patient Position: Sitting, Cuff Size: Large)    Pulse 61    Temp 98.1 F (36.7 C) (Oral)    ASSESSMENT: Completely edentulous Ill-fitting prostheses   PROCEDURES: Upper and Lower complete denture adjustment.   Maxillary and mandibular dentures seated.  Applied PIP paste and reseated maxillary and mandibular dentures. Noted heavy spots and adjusted with acrylic bur as needed.  Guided patient's mandible into CR and verified occlusion using Accufilm. Adjusted occlusion as needed until bilateral balanced occlusion achieved.  Polished areas where adjustments were made. Maxillary denture rocks slightly and is loose.  Improved with adjustments and may be due to overextended denture base into vestibules. Will recheck at next visit. The patient reported that dentures felt much better after adjustments.   PLAN: Next visit:  Denture adjustment in 1 week  All questions and concerns were invited and addressed.  The patient tolerated today's visit well and departed in stable condition.  Charlaine Dalton, D.M.D.

## 2021-03-06 ENCOUNTER — Ambulatory Visit (INDEPENDENT_AMBULATORY_CARE_PROVIDER_SITE_OTHER): Payer: Dental | Admitting: Dentistry

## 2021-03-06 ENCOUNTER — Encounter (HOSPITAL_COMMUNITY): Payer: Self-pay | Admitting: Dentistry

## 2021-03-06 ENCOUNTER — Other Ambulatory Visit: Payer: Self-pay

## 2021-03-06 VITALS — BP 121/74 | HR 74 | Temp 98.1°F

## 2021-03-06 DIAGNOSIS — K08109 Complete loss of teeth, unspecified cause, unspecified class: Secondary | ICD-10-CM

## 2021-03-06 DIAGNOSIS — K0889 Other specified disorders of teeth and supporting structures: Secondary | ICD-10-CM

## 2021-03-06 DIAGNOSIS — Z972 Presence of dental prosthetic device (complete) (partial): Secondary | ICD-10-CM

## 2021-03-06 NOTE — Progress Notes (Signed)
° Department of Dental Medicine °  °Service Date:    03/06/2021 ° °Patient Name:   Melvin Hudson °Date of Birth:    05/24/1955 °Medical Record Number:  [id number] ° ° °TODAY'S VISIT: °DENTURE ADJUSTMENT  ° °PROCEDURES: °C/C denture adjustment °PLAN: °Next visit:  2 week denture adj. °  ° °   03/06/2021 °PROGRESS NOTE: °   COVID-19 SCREENING:  The patient denies symptoms concerning for COVID-19 infection including fever, chills, cough, or newly developed shortness of breath. ° ° °HISTORY OF PRESENT ILLNESS: °Melvin Hudson presents today for 2nd maxillary and mandibular complete denture adjustment.  Delivery of dentures was on 02/05/21.   °Medical and dental history reviewed with the patient.  No reported changes. ° ° °CHIEF COMPLAINT:   °The patient reports that his dentures feel much better, but he still has two small sore spots on the top and the bottom.  He has been able to wear his new dentures since his last visit and continues to get used to them. ° ° °Patient Active Problem List  ° Diagnosis Date Noted  ° Status post radiation therapy 12/05/2020  ° 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  ° Edentulous 12/05/2020  ° Ill-fitting dentures 12/05/2020  ° Former smoker 11/11/2019  ° Healthcare maintenance 11/11/2019  ° Bilateral leg edema 04/21/2018  ° Heart murmur 04/20/2018  ° Erectile dysfunction 04/20/2018  ° Pure hypercholesterolemia 04/20/2018  ° Obesity, Class III, BMI 40-49.9 (morbid obesity) (HCC)   ° BRBPR (bright red blood per rectum) 08/30/2016  ° Chronic pain syndrome   ° History of GI diverticular bleed 04/18/2014  ° Hematochezia 04/10/2014  ° Symptomatic anemia 04/08/2014  ° 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  ° Mouth pain 07/18/2013  ° Weight loss 07/18/2013  ° 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HCC) 06/29/2013  ° OSA (obstructive sleep apnea) 11/19/2011  ° Angio-edema 09/17/2011  ° HTN (hypertension) 09/17/2011  ° Gout 09/17/2011  ° °Past Medical History:  °Diagnosis Date  °  Angioedema 09/17/11  ° tongue and lips  ° Arthritis   ° "both of my legs and feet" (01/05/2014)  ° CHF (congestive heart failure) (HCC)   ° Diabetes mellitus without complication (HCC)   ° Erectile dysfunction 04/20/2018  ° GI bleed   ° Heart murmur 04/20/2018  ° Hx of gout   ° Hypertension   ° Obesity   ° OSA on CPAP   ° Pure hypercholesterolemia 04/20/2018  ° S/P radiation therapy 07/26/2013-09/15/2013  ° Right tonsil/bilateral neck/ 7000 cGy  ° 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  ° °Current Outpatient Medications  °Medication Sig Dispense Refill  ° allopurinol (ZYLOPRIM) 300 MG tablet Take 300 mg by mouth daily as needed.     ° aspirin 81 MG tablet Take 81 mg by mouth daily.     ° atorvastatin (LIPITOR) 40 MG tablet Take 1 tablet (40 mg total) by mouth at bedtime. 90 tablet 1  ° BIDIL 20-37.5 MG tablet TAKE 1 TABLET BY MOUTH THREE TIMES DAILY 90 tablet 0  ° Blood Glucose Monitoring Suppl (ONETOUCH VERIO FLEX SYSTEM) w/Device KIT USE TO CHECK BLOOD SUGAR TWICE DAILY    ° Blood Glucose Monitoring Suppl (ONETOUCH VERIO FLEX SYSTEM) w/Device KIT USE TO CHECK BLOOD SUGAR TWICE DAILY    ° calcium citrate-vitamin D 500-500 MG-UNIT chewable tablet Chew 1 tablet by mouth daily. 1/2 600mg at bedtime    ° carvedilol (COREG) 25 MG tablet Take 25 mg by mouth 2 (two) times daily   daily with a meal.     diphenhydrAMINE (BENADRYL) 25 MG tablet Take 1 tablet (25 mg total) by mouth every 6 (six) hours. 20 tablet 0   enalapril (VASOTEC) 5 MG tablet TAKE 1 TABLET(5 MG) BY MOUTH IN THE MORNING 90 tablet 0   eplerenone (INSPRA) 50 MG tablet Take 50 mg by mouth daily.  2   furosemide (LASIX) 40 MG tablet Take 40 mg by mouth 2 (two) times daily.  2   gabapentin (NEURONTIN) 300 MG capsule Take 300 mg by mouth 3 (three) times daily.   2   hydrocortisone-pramoxine (PROCTOFOAM-HC) rectal foam Place 1 applicator rectally 2 (two) times daily. (Patient taking differently: Place 1 applicator rectally as needed.) 10 g 0   metFORMIN  (GLUCOPHAGE) 500 MG tablet Take 500 mg by mouth 2 (two) times daily with a meal.      NARCAN 4 MG/0.1ML LIQD nasal spray kit SMARTSIG:Both Nares     NIFEdipine (ADALAT CC) 90 MG 24 hr tablet Take 90 mg by mouth daily.     omeprazole (PRILOSEC) 20 MG capsule Take 20 mg by mouth daily.   4   ONETOUCH VERIO test strip USE TO CHECK BLOOD SUGAR TWICE DAILY     oxyCODONE-acetaminophen (PERCOCET) 10-325 MG per tablet Take 1 tablet by mouth every 6 (six) hours as needed for pain. (Patient taking differently: Take 1 tablet by mouth 4 (four) times daily.) 120 tablet 0   OZEMPIC, 0.25 OR 0.5 MG/DOSE, 2 MG/1.5ML SOPN SMARTSIG:0.25 Milligram(s) SUB-Q Once a Week     Vitamin D, Ergocalciferol, (DRISDOL) 1.25 MG (50000 UT) CAPS capsule Take 50,000 Units by mouth. Every Friday.     No current facility-administered medications for this visit.   Allergies  Allergen Reactions   Azilsartan Swelling    Avoid ARB and ACEI per MD   Advil [Ibuprofen]     Feels "jittery"    VITALS: BP 121/74 (BP Location: Left Arm, Patient Position: Sitting, Cuff Size: Large)    Pulse 74    Temp 98.1 F (36.7 C) (Oral)    ASSESSMENT: Completely edentulous    PROCEDURES: Maxillary and mandibular complete denture adjustment.   Maxillary and mandibular dentures seated.  Applied PIP paste and reseated maxillary and mandibular dentures. Noted heavy spots and adjusted with acrylic bur as needed. Guided patient's mandible into CR and verified occlusion using Accufilm. Adjusted occlusion as needed until bilateral balanced occlusion achieved. Polished areas where adjustments were made. Good esthetics, phonetics, overall retention and function noted. The patient reported that dentures fit much better after adjustments and no longer felt sore areas.   PLAN: Denture adjustment in 2 weeks. Call should any questions or concerns arise.  All questions and concerns were invited and addressed.  The patient tolerated today's visit well  and departed in stable condition.  Charlaine Dalton, D.M.D.

## 2021-03-20 ENCOUNTER — Encounter (HOSPITAL_COMMUNITY): Payer: Self-pay | Admitting: Dentistry

## 2021-03-20 ENCOUNTER — Ambulatory Visit (INDEPENDENT_AMBULATORY_CARE_PROVIDER_SITE_OTHER): Payer: Dental | Admitting: Dentistry

## 2021-03-20 ENCOUNTER — Other Ambulatory Visit: Payer: Self-pay

## 2021-03-20 VITALS — BP 118/76 | HR 73 | Temp 98.5°F

## 2021-03-20 DIAGNOSIS — Z972 Presence of dental prosthetic device (complete) (partial): Secondary | ICD-10-CM

## 2021-03-20 DIAGNOSIS — K08109 Complete loss of teeth, unspecified cause, unspecified class: Secondary | ICD-10-CM

## 2021-03-20 DIAGNOSIS — K0889 Other specified disorders of teeth and supporting structures: Secondary | ICD-10-CM

## 2021-03-20 NOTE — Progress Notes (Signed)
Department of Dental Medicine   Service Date:    03/20/2021  Patient Name:   Melvin Hudson Date of Birth:    January 02, 1956 Medical Record Number:  [id number]   TODAY'S VISIT: DENTURE ADJUSTMENT   PROCEDURES: C/C denture adjustment PLAN: Next visit:  1 month denture adjustment or adj as needed       03/20/2021 PROGRESS NOTE:    COVID-19 SCREENING:  The patient denies symptoms concerning for COVID-19 infection including fever, chills, cough, or newly developed shortness of breath.   HISTORY OF PRESENT ILLNESS: Melvin Hudson presents today for upper and lower complete denture adjustment.  Delivery of denture was on 02/05/21.   Medical and dental history reviewed with the patient.  No changes reported.   CHIEF COMPLAINT:   The patient reports that his new dentures have been doing much better.  He says that the suction is so good on his upper denture that it is difficult to remove sometimes.  He does report one sore area on the top right, but otherwise he is getting used to them and is happy with how they look and fit.  He is still adapting to chewing and eating but understands that it will just take some time and more practice.   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   Ill-fitting dentures 12/05/2020   Former smoker 11/11/2019   Healthcare maintenance 11/11/2019   Bilateral leg edema 04/21/2018   Heart murmur 04/20/2018   Erectile dysfunction 04/20/2018   Pure hypercholesterolemia 04/20/2018   Obesity, Class III, BMI 40-49.9 (morbid obesity) ([place])    BRBPR (bright red blood per rectum) 08/30/2016   Chronic pain syndrome    History of GI diverticular bleed 04/18/2014   Hematochezia 04/10/2014   Symptomatic anemia 04/08/2014   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   Mouth pain 07/18/2013   Weight loss 07/18/2013   Malignant neoplasm of  tonsillar fossa ([place]) 06/29/2013   OSA (obstructive sleep apnea) 11/19/2011   Angio-edema 09/17/2011   HTN (hypertension) 09/17/2011   Gout 09/17/2011   Past Medical History:  Diagnosis Date   Angioedema 09/17/11   tongue and lips   Arthritis    "both of my legs and feet" (01/05/2014)   CHF (congestive heart failure) (HCC)    Diabetes mellitus without complication ([place])    Erectile dysfunction 04/20/2018   GI bleed    Heart murmur 04/20/2018   Hx of gout    Hypertension    Obesity    OSA on CPAP    Pure hypercholesterolemia 04/20/2018   S/P radiation therapy 07/26/2013-09/15/2013   Right tonsil/bilateral neck/ 7000 cGy   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      aspirin 81 MG tablet Take 81 mg by mouth daily.      atorvastatin (LIPITOR) 40 MG tablet Take 1 tablet (40 mg total) by mouth at bedtime. 90 tablet 1   BIDIL 20-37.5 MG tablet TAKE 1 TABLET BY MOUTH THREE TIMES DAILY 90 tablet 0   Blood Glucose Monitoring Suppl (ONETOUCH VERIO FLEX SYSTEM) w/Device KIT USE TO CHECK BLOOD SUGAR TWICE DAILY     Blood Glucose Monitoring Suppl ([place]) w/Device KIT USE TO CHECK BLOOD SUGAR TWICE  DAILY     calcium citrate-vitamin D 500-500 MG-UNIT chewable tablet Chew 1 tablet by mouth daily. 1/2 627m at bedtime     carvedilol (COREG) 25 MG tablet Take 25 mg by mouth 2 (two) times daily with a meal.     diphenhydrAMINE (BENADRYL) 25 MG tablet Take 1 tablet (25 mg total) by mouth every 6 (six) hours. 20 tablet 0   enalapril (VASOTEC) 5 MG tablet TAKE 1 TABLET(5 MG) BY MOUTH IN THE MORNING 90 tablet 0   eplerenone (INSPRA) 50 MG tablet Take 50 mg by mouth daily.  2   furosemide (LASIX) 40 MG tablet Take 40 mg by mouth 2 (two) times daily.  2   gabapentin (NEURONTIN) 300 MG capsule Take 300 mg by mouth 3 (three) times daily.    2   hydrocortisone-pramoxine (PROCTOFOAM-HC) rectal foam Place 1 applicator rectally 2 (two) times daily. (Patient taking differently: Place 1 applicator rectally as needed.) 10 g 0   metFORMIN (GLUCOPHAGE) 500 MG tablet Take 500 mg by mouth 2 (two) times daily with a meal.      NARCAN 4 MG/0.1ML LIQD nasal spray kit SMARTSIG:Both Nares     NIFEdipine (ADALAT CC) 90 MG 24 hr tablet Take 90 mg by mouth daily.     omeprazole (PRILOSEC) 20 MG capsule Take 20 mg by mouth daily.   4   ONETOUCH VERIO test strip USE TO CHECK BLOOD SUGAR TWICE DAILY     oxyCODONE-acetaminophen (PERCOCET) 10-325 MG per tablet Take 1 tablet by mouth every 6 (six) hours as needed for pain. (Patient taking differently: Take 1 tablet by mouth 4 (four) times daily.) 120 tablet 0   OZEMPIC, 0.25 OR 0.5 MG/DOSE, 2 MG/1.5ML SOPN SMARTSIG:0.25 Milligram(s) SUB-Q Once a Week     Vitamin D, Ergocalciferol, (DRISDOL) 1.25 MG (50000 UT) CAPS capsule Take 50,000 Units by mouth. Every Friday.     No current facility-administered medications for this visit.   Allergies  Allergen Reactions   Azilsartan Swelling    Avoid ARB and ACEI per MD   Advil [Ibuprofen]     Feels "jittery"    VITALS: BP 118/76 (BP Location: Left Arm, Patient Position: Sitting, Cuff Size: Large)    Pulse 73    Temp 98.5 F (36.9 C) (Oral)    ASSESSMENT: Completely edentulous Ill-fitting dentures   PROCEDURES: Maxillary and mandibular complete denture adjustment.  Maxillary complete denture seated.  Applied PIP paste and reseated maxillary denture.  Noted heavy spots and adjusted with acrylic bur as needed.  Guided patient's mandible into CR and verified occlusion using Accufilm.  Adjusted occlusion as needed until bilateral balanced occlusion achieved.  Polished areas where adjustments were made.  Good esthetics, phonetics, overall retention and function noted. The patient reported that his dentures felt better after adjustments and is  satisfied.   PLAN: 1 month denture adjustment or adjustments as needed. 1 year edentulous/denture checks (NEXT RECALL DUE: 11/2021)  All questions and concerns were invited and addressed.  The patient tolerated today's visit well and departed in stable condition.  MCharlaine Dalton D.M.D.

## 2021-03-30 ENCOUNTER — Other Ambulatory Visit: Payer: Self-pay | Admitting: Cardiology

## 2021-03-30 DIAGNOSIS — I10 Essential (primary) hypertension: Secondary | ICD-10-CM

## 2021-04-10 ENCOUNTER — Ambulatory Visit: Payer: Medicare HMO | Admitting: Cardiology

## 2021-04-10 ENCOUNTER — Encounter: Payer: Self-pay | Admitting: Cardiology

## 2021-04-10 ENCOUNTER — Other Ambulatory Visit: Payer: Self-pay

## 2021-04-10 VITALS — BP 127/84 | HR 78 | Temp 98.0°F | Resp 17 | Ht 74.0 in | Wt 308.4 lb

## 2021-04-10 DIAGNOSIS — I10 Essential (primary) hypertension: Secondary | ICD-10-CM

## 2021-04-10 DIAGNOSIS — E1165 Type 2 diabetes mellitus with hyperglycemia: Secondary | ICD-10-CM

## 2021-04-10 DIAGNOSIS — Z87891 Personal history of nicotine dependence: Secondary | ICD-10-CM

## 2021-04-10 DIAGNOSIS — I5032 Chronic diastolic (congestive) heart failure: Secondary | ICD-10-CM

## 2021-04-10 DIAGNOSIS — G4733 Obstructive sleep apnea (adult) (pediatric): Secondary | ICD-10-CM

## 2021-04-10 DIAGNOSIS — Z6839 Body mass index (BMI) 39.0-39.9, adult: Secondary | ICD-10-CM

## 2021-04-10 DIAGNOSIS — E782 Mixed hyperlipidemia: Secondary | ICD-10-CM

## 2021-04-10 MED ORDER — ENALAPRIL MALEATE 5 MG PO TABS: 5.0000 mg | ORAL_TABLET | Freq: Every day | ORAL | 0 refills | Status: DC

## 2021-04-10 MED ORDER — DAPAGLIFLOZIN PROPANEDIOL 10 MG PO TABS: 10.0000 mg | ORAL_TABLET | Freq: Every day | ORAL | 0 refills | Status: DC

## 2021-04-10 MED ORDER — DAPAGLIFLOZIN PROPANEDIOL 10 MG PO TABS: 10.0000 mg | ORAL_TABLET | Freq: Every day | ORAL | 0 refills | Status: AC

## 2021-04-10 NOTE — Progress Notes (Signed)
Melvin Hudson Date of Birth: [birth year] MRN: [id number] Primary Care Provider:Reese, Zadie Cleverly, MD Former Cardiology Providers: Jeri Lager, APRN, FNP-C Primary Cardiologist: Rex Kras, DO, [facility] (established care 09/13/2019)  Date: 04/10/21 Last Office Visit: 10/08/2021  Chief Complaint  Patient presents with   Follow-up    6 MONTH   Congestive Heart Failure   Hyperlipidemia    HPI  Melvin Hudson is a 66 y.o.  male who presents to the office with a chief complaint of "36-monthfollow-up heart failure/hyperlipidemia." Patient's past medical history and cardiovascular risk factors include: Hypertension, diabetes mellitus, former tobacco use, history of throat cancer diagnosis in 2015, no significant epicardial coronary disease per angiography in 2012, chronic heart failure with preserved EF, OSA on CPAP, advanced age.  Patient presents today for 636-monthollow-up visit for management of congestive heart failure and hyperlipidemia.  Since last office visit patient is doing well from a cardiovascular standpoint with no recurrent hospitalizations for angina pectoris or heart failure.  Since last office visit he has not implemented lifestyle changes and has lost approximately 14 pounds for which he is congratulated for today's office visit.  Since last office visit he had labs in September 2022 which were independently reviewed at today's office visit and noted below for further reference.  Kidney function and electrolytes are within acceptable range.  Total cholesterol is less than 100 and direct LDL was 43 mg/dL.  FUNCTIONAL STATUS: No structured exercise program or daily routine.    ALLERGIES: Allergies  Allergen Reactions   Azilsartan Itching    Avoid ARB and ACEI per MD   Advil [Ibuprofen]     Feels "jittery"    MEDICATION LIST PRIOR TO VISIT: Current Outpatient Medications on File Prior to Visit  Medication Sig Dispense Refill   allopurinol (ZYLOPRIM) 300 MG tablet Take 300  mg by mouth daily as needed.      aspirin 81 MG tablet Take 81 mg by mouth daily.      atorvastatin (LIPITOR) 40 MG tablet Take 1 tablet (40 mg total) by mouth at bedtime. 90 tablet 1   BIDIL 20-37.5 MG tablet TAKE 1 TABLET BY MOUTH THREE TIMES DAILY 90 tablet 0   Blood Glucose Monitoring Suppl (ONETOUCH VERIO FLEX SYSTEM) w/Device KIT USE TO CHECK BLOOD SUGAR TWICE DAILY     Blood Glucose Monitoring Suppl (ONETOUCH VERIO FLEX SYSTEM) w/Device KIT USE TO CHECK BLOOD SUGAR TWICE DAILY     calcium citrate-vitamin D 500-500 MG-UNIT chewable tablet Chew 1 tablet by mouth daily. 1/2 60027mt bedtime     carvedilol (COREG) 25 MG tablet Take 25 mg by mouth 2 (two) times daily with a meal.     diphenhydrAMINE (BENADRYL) 25 MG tablet Take 1 tablet (25 mg total) by mouth every 6 (six) hours. 20 tablet 0   eplerenone (INSPRA) 50 MG tablet Take 50 mg by mouth daily.  2   gabapentin (NEURONTIN) 300 MG capsule Take 300 mg by mouth 3 (three) times daily.   2   metFORMIN (GLUCOPHAGE) 500 MG tablet Take 500 mg by mouth 2 (two) times daily with a meal.      NARCAN 4 MG/0.1ML LIQD nasal spray kit SMARTSIG:Both Nares     NIFEdipine (ADALAT CC) 90 MG 24 hr tablet Take 90 mg by mouth daily.     omeprazole (PRILOSEC) 20 MG capsule Take 20 mg by mouth daily.   4   ONETOUCH VERIO test strip USE TO CHECK BLOOD SUGAR TWICE DAILY  oxyCODONE-acetaminophen (PERCOCET) 10-325 MG per tablet Take 1 tablet by mouth every 6 (six) hours as needed for pain. (Patient taking differently: Take 1 tablet by mouth 4 (four) times daily.) 120 tablet 0   OZEMPIC, 0.25 OR 0.5 MG/DOSE, 2 MG/1.5ML SOPN SMARTSIG:0.25 Milligram(s) SUB-Q Once a Week     Vitamin D, Ergocalciferol, (DRISDOL) 1.25 MG (50000 UT) CAPS capsule Take 50,000 Units by mouth. Every Friday.     No current facility-administered medications on file prior to visit.    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    PAST SURGICAL HISTORY: Past Surgical History:  Procedure Laterality Date   COLONOSCOPY Left 04/11/2014   Procedure: COLONOSCOPY;  Surgeon: Arta Silence, MD;  Location: [facility] ENDOSCOPY;  Service: Endoscopy;  Laterality: Left;   COLONOSCOPY N/A 04/20/2014   Procedure: COLONOSCOPY;  Surgeon: Lear Ng, MD;  Location: [facility] ENDOSCOPY;  Service: Endoscopy;  Laterality: N/A;   COLONOSCOPY W/ BIOPSIES AND POLYPECTOMY     benign   DIRECT LARYNGOSCOPY  01/05/2014   DIRECT LARYNGOSCOPY N/A 01/05/2014   Procedure: DIRECT LARYNGOSCOPY AND BIOPSY;  Surgeon: [name], MD;  Location: [facility];  Service: ENT;  Laterality: N/A;   KNEE ARTHROSCOPY Right ~ [place] CORONARY/GRAFT ANGIOGRAM N/A 12/31/2010   Procedure: LEFT AND RIGHT HEART CATHETERIZATION WITH Beatrix Fetters;  Surgeon: Laverda Page, MD;  Location: [facility] CATH LAB;  Service: Cardiovascular;  Laterality: N/A;   MULTIPLE EXTRACTIONS WITH ALVEOLOPLASTY N/A 07/08/2013   Procedure: Extraction of tooth #'s 22, 27 with alveoloplasty and bilateal mandibular facial exostoses reductions;  Surgeon: Lenn Cal, DDS;  Location: [facility] ORS;  Service: Oral Surgery;  Laterality: N/A;   MULTIPLE TOOTH EXTRACTIONS  ~ 2008    FAMILY HISTORY: The patient's family history includes Aneurysm in his father; Cancer in his mother and sister; Diabetes in his father; Hypertension in his mother; Stroke in his mother.   SOCIAL HISTORY:  The patient  reports that he quit  smoking about 7 years ago. His smoking use included cigarettes and cigars. He has a 84.00 pack-year smoking history. He has never used smokeless tobacco. He reports that he does not drink alcohol and does not use drugs.  Review of Systems  Cardiovascular:  Negative for chest pain, dyspnea on exertion, leg swelling, near-syncope, orthopnea, palpitations, paroxysmal nocturnal dyspnea and syncope.  Respiratory:  Negative for shortness of breath.   PHYSICAL EXAM: Vitals with BMI 04/10/2021 04/10/2021 03/20/2021  Height - 6' 2"  -  Weight - 308 lbs 6 oz -  BMI - 37.62 -  Systolic 831 517 616  Diastolic 84 88 76  Pulse 78 72 73    CONSTITUTIONAL: Well-developed and well-nourished. No acute distress.  SKIN: Skin is warm and dry. No rash noted. No cyanosis. No pallor. No jaundice HEAD: Normocephalic and atraumatic.  EYES: No scleral icterus, arcus senilis MOUTH/THROAT:  Moist oral membranes.  NECK: No JVD present. No thyromegaly noted. No carotid bruits  LYMPHATIC: No visible cervical adenopathy.                                                                                                     CHEST Normal respiratory effort. No intercostal retractions  LUNGS: Clear to auscultation bilaterally. No stridor. No wheezes. No rales.  CARDIOVASCULAR: Regular rate and rhythm, positive S1-S2, no murmurs rubs or gallops appreciated. ABDOMINAL:  soft, nontender, nondistended, positive bowel sounds all 4 quadrants. No apparent ascites.  EXTREMITIES:   Warm to touch bilaterally, trace bilateral peripheral edema.  HEMATOLOGIC: No significant bruising NEUROLOGIC: Oriented to person, place, and time. Nonfocal. Normal muscle tone.  PSYCHIATRIC: Normal mood and affect. Normal behavior. Cooperative  No significant change in physical examination since last office encounter.  CARDIAC DATABASE: EKG: 04/10/2021: Normal sinus rhythm, first-degree AV block, incomplete right bundle branch block, left axis, left anterior  fascicular block, nonspecific T wave changes.    Echocardiogram: 07/26/2018: LVEF 56%, moderate LVH, grade 1 diastolic impairment, normal left atrial pressure, mildly dilated left atrium.  Stress Testing:  Lexiscan Myoview stress test 06/28/2018: 1. Lexiscan stress test was performed. Exercise capacity was not assessed. Stress symptoms included dyspnea, dizziness.  Resting blood pressure was 132/90 mmHg and peak effect blood pressure was 138/82 mmHg. The resting and stress electrocardiogram demonstrated sinus tachycardia, left anterior fascicular block, possible old posterior infarct, no resting arrhythmias and normal repolarization.  Stress EKG is non diagnostic for ischemia as it is a pharmacologic stress.  2. The overall quality of the study is fair.  Left ventricular cavity is noted to be normal on the rest and stress studies.  Gated SPECT images reveal normal myocardial thickening and wall motion.  The left ventricular ejection fraction was calculated or visually estimated to be 54%.  REST and STRESS images demonstrate decreased tracer uptake in the basal inferior and mid inferior segments of the left ventricle, likely due to tissue attenuation artifact (BMI 42). Ischemia in this region cannot be excluded. Recommend clinical correlation.  3. Low risk study.  Heart Catheterization: Coronary Angiogram  [12/31/2010]: Heart cath 12/31/10: Noral LVEF. Slow flow in coronary arteries without stenosis. Mild pulmonary HTN.   LABORATORY DATA: CBC Latest Ref Rng & Units 09/03/2016 09/03/2016 09/03/2016  WBC 4.0 - 10.5 K/uL - - -  Hemoglobin 13.0 - 17.0 g/dL 10.4(L) 9.7(L) 9.9(L)  Hematocrit 39.0 - 52.0 % 30.3(L) 28.1(L) 28.4(L)  Platelets 150 - 400 K/uL - - -    CMP Latest Ref Rng & Units 11/13/2020 04/10/2020 09/14/2019  Glucose 70 - 99 mg/dL 99 90 108(H)  BUN 8 - 27 mg/dL 9 11 11   Creatinine 0.76 - 1.27 mg/dL 0.89 0.86 0.95  Sodium 134 - 144 mmol/L 142 143 140  Potassium 3.5 - 5.2 mmol/L 4.3 4.3 4.1   Chloride 96 - 106 mmol/L 102 103 101  CO2 20 - 29 mmol/L 26 25 26   Calcium 8.6 - 10.2 mg/dL 9.1 9.4 9.4  Total Protein 6.0 - 8.5 g/dL 7.1 6.7 -  Total Bilirubin 0.0 - 1.2 mg/dL 0.6 0.6 -  Alkaline Phos 44 - 121 IU/L 181(H) 165(H) -  AST 0 - 40 IU/L 18 15 -  ALT 0 - 44 IU/L 12 16 -    Lipid Panel     Component Value Date/Time   CHOL 97 (L) 11/13/2020 0841   TRIG 54 11/13/2020 0841   HDL 42 11/13/2020 0841   LDLCALC 42 11/13/2020 0841   LDLDIRECT 43 11/13/2020 0841   LABVLDL 13 11/13/2020 0841    No results found for: HGBA1C No components found for: NTPROBNP Lab Results  Component Value Date   TSH 1.160 04/16/2018   TSH 1.588 04/10/2017   TSH 1.089 04/11/2016    Cardiac Panel (last 3 results) No results for input(s): CKTOTAL, CKMB, TROPONINIHS, RELINDX in the last 72 hours.  IMPRESSION:    ICD-10-CM   1. Chronic heart failure with preserved ejection fraction (HCC)  I50.32 enalapril (VASOTEC) 5 MG tablet    dapagliflozin propanediol (FARXIGA) 10 MG TABS tablet    Basic metabolic panel    Magnesium    2. Mixed hyperlipidemia  E78.2     3. Essential hypertension  I10 EKG 12-Lead    4. Former smoker  Z87.891     5. Type 2 diabetes mellitus with hyperglycemia, without long-term current use of insulin (HCC)  E11.65     6. OSA on CPAP  G47.33    Z99.89     7. Class 2 severe obesity due to excess calories with serious comorbidity and body mass index (BMI) of 39.0 to 39.9 in adult [facility])  E66.01    Z68.39        RECOMMENDATIONS: Melvin Hudson is a 66 y.o. male whose past medical history and cardiovascular risk factors include: Hypertension, diabetes mellitus, former tobacco use, history of throat cancer diagnosis in 2015, no significant epicardial coronary disease per angiography in 2012, chronic heart failure with preserved EF, OSA on CPAP, advanced age.  Chronic heart failure with preserved ejection fraction (HCC) Euvolemic. No hospitalizations since last office  visit. Has implemented lifestyle changes and has lost 14 pounds, for which she is congratulated for today's office visit. Medications reconciled. He is currently on Vasotec and the goal is to transition him to [place].  However he is allergic to ARB's and therefore we will hold off at the current time. Discontinue Lasix. Start Farxiga 10 mg p.o. daily. Blood work in 1 week to evaluate kidney function and electrolytes.  Mixed hyperlipidemia Currently on atorvastatin.   He denies myalgia or other side effects. Most recent lipids dated 10/2020 reviewed as noted above. LDL currently at goal.  Essential hypertension Office blood pressures are very well controlled. Medications reconciled. Reemphasized the importance of a low-salt diet. Discontinue Lasix and transition to [place].  Type 2 diabetes mellitus with hyperglycemia, without long-term current use of insulin (HCC) Educated him on the importance of glycemic control given his chronic comorbid conditions. Currently management primary team.  FINAL MEDICATION LIST END OF ENCOUNTER: Meds ordered this encounter  Medications   enalapril (VASOTEC) 5 MG tablet    Sig: Take 1 tablet (5 mg total) by mouth daily.    Dispense:  90 tablet    Refill:  0   dapagliflozin propanediol (FARXIGA) 10 MG TABS tablet    Sig: Take 1 tablet (10 mg total) by mouth daily before breakfast.    Dispense:  90 tablet    Refill:  0     Current Outpatient Medications:  allopurinol (ZYLOPRIM) 300 MG tablet, Take 300 mg by mouth daily as needed. , Disp: , Rfl:    aspirin 81 MG tablet, Take 81 mg by mouth daily. , Disp: , Rfl:    atorvastatin (LIPITOR) 40 MG tablet, Take 1 tablet (40 mg total) by mouth at bedtime., Disp: 90 tablet, Rfl: 1   BIDIL 20-37.5 MG tablet, TAKE 1 TABLET BY MOUTH THREE TIMES DAILY, Disp: 90 tablet, Rfl: 0   Blood Glucose Monitoring Suppl (ONETOUCH VERIO FLEX SYSTEM) w/Device KIT, USE TO CHECK BLOOD SUGAR TWICE DAILY, Disp: , Rfl:     Blood Glucose Monitoring Suppl (ONETOUCH VERIO FLEX SYSTEM) w/Device KIT, USE TO CHECK BLOOD SUGAR TWICE DAILY, Disp: , Rfl:    calcium citrate-vitamin D 500-500 MG-UNIT chewable tablet, Chew 1 tablet by mouth daily. 1/2 649m at bedtime, Disp: , Rfl:    carvedilol (COREG) 25 MG tablet, Take 25 mg by mouth 2 (two) times daily with a meal., Disp: , Rfl:    dapagliflozin propanediol (FARXIGA) 10 MG TABS tablet, Take 1 tablet (10 mg total) by mouth daily before breakfast., Disp: 90 tablet, Rfl: 0   diphenhydrAMINE (BENADRYL) 25 MG tablet, Take 1 tablet (25 mg total) by mouth every 6 (six) hours., Disp: 20 tablet, Rfl: 0   enalapril (VASOTEC) 5 MG tablet, Take 1 tablet (5 mg total) by mouth daily., Disp: 90 tablet, Rfl: 0   eplerenone (INSPRA) 50 MG tablet, Take 50 mg by mouth daily., Disp: , Rfl: 2   gabapentin (NEURONTIN) 300 MG capsule, Take 300 mg by mouth 3 (three) times daily. , Disp: , Rfl: 2   metFORMIN (GLUCOPHAGE) 500 MG tablet, Take 500 mg by mouth 2 (two) times daily with a meal. , Disp: , Rfl:    NARCAN 4 MG/0.1ML LIQD nasal spray kit, SMARTSIG:Both Nares, Disp: , Rfl:    NIFEdipine (ADALAT CC) 90 MG 24 hr tablet, Take 90 mg by mouth daily., Disp: , Rfl:    omeprazole (PRILOSEC) 20 MG capsule, Take 20 mg by mouth daily. , Disp: , Rfl: 4   ONETOUCH VERIO test strip, USE TO CHECK BLOOD SUGAR TWICE DAILY, Disp: , Rfl:    oxyCODONE-acetaminophen (PERCOCET) 10-325 MG per tablet, Take 1 tablet by mouth every 6 (six) hours as needed for pain. (Patient taking differently: Take 1 tablet by mouth 4 (four) times daily.), Disp: 120 tablet, Rfl: 0   OZEMPIC, 0.25 OR 0.5 MG/DOSE, 2 MG/1.5ML SOPN, SMARTSIG:0.25 Milligram(s) SUB-Q Once a Week, Disp: , Rfl:    Vitamin D, Ergocalciferol, (DRISDOL) 1.25 MG (50000 UT) CAPS capsule, Take 50,000 Units by mouth. Every Friday., Disp: , Rfl:   Orders Placed This Encounter  Procedures   Basic metabolic panel   Magnesium   EKG 12-Lead   --Continue cardiac  medications as reconciled in final medication list. --Return in about 6 months (around 10/08/2021) for Follow up, heart failure management.. Or sooner if needed. --Continue follow-up with your primary care physician regarding the management of your other chronic comorbid conditions.  Patient's questions and concerns were addressed to his satisfaction. He voices understanding of the instructions provided during this encounter.   This note was created using a voice recognition software as a result there may be grammatical errors inadvertently enclosed that do not reflect the nature of this encounter. Every attempt is made to correct such errors.  SRex Kras DNevada FThe Center For Specialized Surgery LP Pager: [phone]: [phone]

## 2021-04-24 ENCOUNTER — Other Ambulatory Visit: Payer: Self-pay | Admitting: Cardiology

## 2021-04-24 ENCOUNTER — Encounter (HOSPITAL_COMMUNITY): Payer: Medicare HMO | Admitting: Dentistry

## 2021-04-25 LAB — BASIC METABOLIC PANEL
BUN/Creatinine Ratio: 13 (ref 10–24)
BUN: 14 mg/dL (ref 8–27)
CO2: 25 mmol/L (ref 20–29)
Calcium: 9.5 mg/dL (ref 8.6–10.2)
Chloride: 102 mmol/L (ref 96–106)
Creatinine, Ser: 1.12 mg/dL (ref 0.76–1.27)
Glucose: 104 mg/dL — ABNORMAL HIGH (ref 70–99)
Potassium: 4.9 mmol/L (ref 3.5–5.2)
Sodium: 139 mmol/L (ref 134–144)
eGFR: 72 mL/min/{1.73_m2} (ref >59)

## 2021-04-25 LAB — MAGNESIUM: Magnesium: 2.3 mg/dL (ref 1.6–2.3)

## 2021-04-26 ENCOUNTER — Other Ambulatory Visit: Payer: Self-pay

## 2021-04-26 DIAGNOSIS — I5032 Chronic diastolic (congestive) heart failure: Secondary | ICD-10-CM

## 2021-04-26 NOTE — Progress Notes (Signed)
Called pt to inform him about his results. Pt understood   

## 2021-06-27 ENCOUNTER — Other Ambulatory Visit: Payer: Self-pay | Admitting: Cardiology

## 2021-06-27 DIAGNOSIS — I5032 Chronic diastolic (congestive) heart failure: Secondary | ICD-10-CM

## 2021-07-17 ENCOUNTER — Ambulatory Visit (INDEPENDENT_AMBULATORY_CARE_PROVIDER_SITE_OTHER): Payer: Medicare HMO | Admitting: Dentistry

## 2021-07-17 DIAGNOSIS — K08109 Complete loss of teeth, unspecified cause, unspecified class: Secondary | ICD-10-CM

## 2021-07-17 NOTE — Progress Notes (Unsigned)
Department of Dental Medicine   Service Date:    07/17/2021  Patient Name:   Melvin Hudson Date of Birth:    1955/11/26 Medical Record Number:  [id number]   TODAY'S VISIT: DENTURE ADJUSTMENT   PROCEDURES: C/C denture adjustment PLAN: Next visit:  Adjustment(s) as needed       07/17/2021 PROGRESS NOTE:    COVID-19 SCREENING:  The patient denies symptoms concerning for COVID-19 infection including fever, chills, cough, or newly developed shortness of breath.   HISTORY OF PRESENT ILLNESS: Melvin Hudson presents today for maxillary and mandibular complete denture adjustment. Medical and dental history reviewed with the patient.   CHIEF COMPLAINT:   The patient reports pain on chewing in the posterior upper left and right hamular notch region.  Otherwise, he has no other complaints.   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   Ill-fitting dentures 12/05/2020   Former smoker 11/11/2019   Healthcare maintenance 11/11/2019   Bilateral leg edema 04/21/2018   Heart murmur 04/20/2018   Erectile dysfunction 04/20/2018   Pure hypercholesterolemia 04/20/2018   Obesity, Class III, BMI 40-49.9 (morbid obesity) ([place])    BRBPR (bright red blood per rectum) 08/30/2016   Chronic pain syndrome    History of GI diverticular bleed 04/18/2014   Hematochezia 04/10/2014   Symptomatic anemia 04/08/2014   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   Mouth pain 07/18/2013   Weight loss 07/18/2013   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   OSA (obstructive sleep apnea) 11/19/2011   Angio-edema 09/17/2011   HTN (hypertension) 09/17/2011   Gout 09/17/2011   Past Medical History:  Diagnosis Date   Angioedema 09/17/11   tongue and lips   Arthritis    "both of my legs and feet" (01/05/2014)   CHF (congestive heart failure) (HCC)     Diabetes mellitus without complication ([place])    Erectile dysfunction 04/20/2018   GI bleed    Heart murmur 04/20/2018   Hx of gout    Hypertension    Obesity    OSA on CPAP    Pure hypercholesterolemia 04/20/2018   S/P radiation therapy 07/26/2013-09/15/2013   Right tonsil/bilateral neck/ 7000 cGy   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      aspirin 81 MG tablet Take 81 mg by mouth daily.      atorvastatin (LIPITOR) 40 MG tablet Take 1 tablet (40 mg total) by mouth at bedtime. 90 tablet 1   BIDIL 20-37.5 MG tablet TAKE 1 TABLET BY MOUTH THREE TIMES DAILY 90 tablet 0   Blood Glucose Monitoring Suppl (ONETOUCH VERIO FLEX SYSTEM) w/Device KIT USE TO CHECK BLOOD SUGAR TWICE DAILY     Blood Glucose Monitoring Suppl (ONETOUCH VERIO FLEX SYSTEM) w/Device KIT USE TO CHECK BLOOD SUGAR TWICE DAILY     calcium citrate-vitamin D 500-500 MG-UNIT chewable tablet Chew 1 tablet by mouth daily. 1/2 669m at bedtime     carvedilol (COREG) 25 MG tablet Take 25 mg by mouth 2 (two) times daily with a meal.     diphenhydrAMINE (BENADRYL) 25 MG tablet Take 1 tablet (25 mg total) by mouth every 6 (six) hours. 20 tablet 0   enalapril (VASOTEC) 5 MG tablet TAKE 1  TABLET(5 MG) BY MOUTH IN THE MORNING 90 tablet 0   eplerenone (INSPRA) 50 MG tablet Take 50 mg by mouth daily.  2   gabapentin (NEURONTIN) 300 MG capsule Take 300 mg by mouth 3 (three) times daily.   2   metFORMIN (GLUCOPHAGE) 500 MG tablet Take 500 mg by mouth 2 (two) times daily with a meal.      NARCAN 4 MG/0.1ML LIQD nasal spray kit SMARTSIG:Both Nares     NIFEdipine (ADALAT CC) 90 MG 24 hr tablet Take 90 mg by mouth daily.     omeprazole (PRILOSEC) 20 MG capsule Take 20 mg by mouth daily.   4   ONETOUCH VERIO test strip USE TO CHECK BLOOD SUGAR TWICE DAILY     oxyCODONE-acetaminophen (PERCOCET)  10-325 MG per tablet Take 1 tablet by mouth every 6 (six) hours as needed for pain. (Patient taking differently: Take 1 tablet by mouth 4 (four) times daily.) 120 tablet 0   OZEMPIC, 0.25 OR 0.5 MG/DOSE, 2 MG/1.5ML SOPN SMARTSIG:0.25 Milligram(s) SUB-Q Once a Week     Vitamin D, Ergocalciferol, (DRISDOL) 1.25 MG (50000 UT) CAPS capsule Take 50,000 Units by mouth. Every Friday.     No current facility-administered medications for this visit.   Allergies  Allergen Reactions   Azilsartan Itching    Avoid ARB and ACEI per MD   Advil [Ibuprofen]     Feels "jittery"     VITALS: BP 109/72 (BP Location: Right Arm, Patient Position: Sitting, Cuff Size: Large)   Pulse 66   Temp 98.1 F (36.7 C) (Oral)    ASSESSMENT: Completely edentulous    PROCEDURES: Denture adjustment of maxillary complete denture.   Maxillary and mandibular dentures seated.   Applied PIP paste and seated maxillary denture.  Noted heavy spots and adjusted with acrylic bur as needed.  Guided patient's mandible into CR and verified occlusion using Accufilm.  Adjusted occlusion as needed until bilateral balanced occlusion achieved.  Polished areas where adjustments were made.  Good esthetics, phonetics, overall retention and function noted.   PLAN: Follow-up as needed for denture adjustment. 1 year edentulous check Call should any questions or concerns arise.  All questions and concerns were invited and addressed.  The patient tolerated today's visit well and departed in stable condition.  Charlaine Dalton, D.M.D.

## 2021-07-24 ENCOUNTER — Ambulatory Visit (INDEPENDENT_AMBULATORY_CARE_PROVIDER_SITE_OTHER): Payer: Medicare HMO | Admitting: Dentistry

## 2021-07-24 ENCOUNTER — Encounter (HOSPITAL_COMMUNITY): Payer: Self-pay | Admitting: Dentistry

## 2021-07-24 VITALS — BP 110/70 | HR 73 | Temp 98.0°F

## 2021-07-24 DIAGNOSIS — K08109 Complete loss of teeth, unspecified cause, unspecified class: Secondary | ICD-10-CM

## 2021-07-24 NOTE — Progress Notes (Signed)
Department of Dental Medicine   Service Date:    07/24/2021  Patient Name:   Melvin Hudson Date of Birth:    02-08-1956 Medical Record Number:  [id number]   TODAY'S VISIT: DENTURE ADJUSTMENT   PROCEDURES: C/C denture adjustment PLAN: Adjustment(s) as needed   07/24/2021   HISTORY OF PRESENT ILLNESS: Melvin Hudson presents today for upper and lower complete denture adjustment.  Medical and dental history reviewed with the patient.  No reported changes.   CHIEF COMPLAINT:   The patient reports that he only has one or two very small areas that are bothering him now.  He says that his dentures are much improved and he is overall happy with them.   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   Ill-fitting dentures 12/05/2020   Former smoker 11/11/2019   Healthcare maintenance 11/11/2019   Bilateral leg edema 04/21/2018   Heart murmur 04/20/2018   Erectile dysfunction 04/20/2018   Pure hypercholesterolemia 04/20/2018   Obesity, Class III, BMI 40-49.9 (morbid obesity) ([place])    BRBPR (bright red blood per rectum) 08/30/2016   Chronic pain syndrome    History of GI diverticular bleed 04/18/2014   Hematochezia 04/10/2014   Symptomatic anemia 04/08/2014   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   Mouth pain 07/18/2013   Weight loss 07/18/2013   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[place]) 06/29/2013   OSA (obstructive sleep apnea) 11/19/2011   Angio-edema 09/17/2011   HTN (hypertension) 09/17/2011   Gout 09/17/2011   Past Medical History:  Diagnosis Date   Angioedema 09/17/11   tongue and lips   Arthritis    "both of my legs and feet" (01/05/2014)   CHF (congestive heart failure) (HCC)    Diabetes mellitus without complication ([place])    Erectile dysfunction 04/20/2018   GI bleed    Heart murmur 04/20/2018   Hx of gout    Hypertension     Obesity    OSA on CPAP    Pure hypercholesterolemia 04/20/2018   S/P radiation therapy 07/26/2013-09/15/2013   Right tonsil/bilateral neck/ 7000 cGy   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      aspirin 81 MG tablet Take 81 mg by mouth daily.      atorvastatin (LIPITOR) 40 MG tablet Take 1 tablet (40 mg total) by mouth at bedtime. 90 tablet 1   BIDIL 20-37.5 MG tablet TAKE 1 TABLET BY MOUTH THREE TIMES DAILY 90 tablet 0   Blood Glucose Monitoring Suppl (ONETOUCH VERIO FLEX SYSTEM) w/Device KIT USE TO CHECK BLOOD SUGAR TWICE DAILY     Blood Glucose Monitoring Suppl (ONETOUCH VERIO FLEX SYSTEM) w/Device KIT USE TO CHECK BLOOD SUGAR TWICE DAILY     calcium citrate-vitamin D 500-500 MG-UNIT chewable tablet Chew 1 tablet by mouth daily. 1/2 665m at bedtime     carvedilol (COREG) 25 MG tablet Take 25 mg by mouth 2 (two) times daily with a meal.     diphenhydrAMINE (BENADRYL) 25 MG tablet Take 1 tablet (25 mg total) by mouth every 6 (six) hours. 20 tablet 0   enalapril (VASOTEC) 5 MG tablet TAKE 1 TABLET(5 MG) BY MOUTH IN THE MORNING 90 tablet 0   eplerenone (INSPRA) 50 MG tablet  Take 50 mg by mouth daily.  2   gabapentin (NEURONTIN) 300 MG capsule Take 300 mg by mouth 3 (three) times daily.   2   metFORMIN (GLUCOPHAGE) 500 MG tablet Take 500 mg by mouth 2 (two) times daily with a meal.      NARCAN 4 MG/0.1ML LIQD nasal spray kit SMARTSIG:Both Nares     NIFEdipine (ADALAT CC) 90 MG 24 hr tablet Take 90 mg by mouth daily.     omeprazole (PRILOSEC) 20 MG capsule Take 20 mg by mouth daily.   4   ONETOUCH VERIO test strip USE TO CHECK BLOOD SUGAR TWICE DAILY     oxyCODONE-acetaminophen (PERCOCET) 10-325 MG per tablet Take 1 tablet by mouth every 6 (six) hours as needed for pain. (Patient taking differently: Take 1 tablet by mouth 4 (four) times  daily.) 120 tablet 0   OZEMPIC, 0.25 OR 0.5 MG/DOSE, 2 MG/1.5ML SOPN SMARTSIG:0.25 Milligram(s) SUB-Q Once a Week     Vitamin D, Ergocalciferol, (DRISDOL) 1.25 MG (50000 UT) CAPS capsule Take 50,000 Units by mouth. Every Friday.     No current facility-administered medications for this visit.   Allergies  Allergen Reactions   Azilsartan Itching    Avoid ARB and ACEI per MD   Advil [Ibuprofen]     Feels "jittery"    VITALS: BP 110/70 (BP Location: Right Arm, Patient Position: Sitting, Cuff Size: Normal)   Pulse 73   Temp 98 F (36.7 C) (Oral)    ASSESSMENT: Completely edentulous    PROCEDURES: Denture adjustment of removable Maxillary and Mandibular complete dentures.   Maxillary and mandibular dentures seated.  Applied PIP paste and seated maxillary and mandibular dentures.  Noted heavy spots and adjusted with acrylic bur as needed.  Guided patient's mandible into CR and verified occlusion using Accufilm.  Adjusted occlusion as needed until bilateral balanced occlusion achieved.  Polished areas where adjustments were made.  Good esthetics, phonetics, overall retention and function noted.   PLAN: Follow-up as needed for denture adjustment(s). 1 year edentulous checks/recall visits Call should any questions or concerns arise before next visit.  All questions and concerns were invited and addressed.  The patient tolerated today's visit well and departed in stable condition.  Charlaine Dalton, D.M.D.

## 2021-09-05 ENCOUNTER — Other Ambulatory Visit: Payer: Self-pay | Admitting: Cardiology

## 2021-09-05 ENCOUNTER — Telehealth: Payer: Self-pay | Admitting: Cardiology

## 2021-09-05 DIAGNOSIS — I5032 Chronic diastolic (congestive) heart failure: Secondary | ICD-10-CM

## 2021-09-05 NOTE — Telephone Encounter (Signed)
Melvin Hudson w/[facility] pharmacy calling in regards to patient's refill. Says they received the 90 day supply, but insurance is saying it needs to be a 100 day supply instead. Please call her back at [phone].

## 2021-09-06 ENCOUNTER — Other Ambulatory Visit: Payer: Self-pay

## 2021-09-06 MED ORDER — FARXIGA 10 MG PO TABS: 10.0000 mg | ORAL_TABLET | Freq: Every day | ORAL | 0 refills | Status: DC

## 2021-09-06 NOTE — Telephone Encounter (Signed)
Refill sent.

## 2021-10-08 ENCOUNTER — Ambulatory Visit: Payer: Medicare HMO | Admitting: Cardiology

## 2021-10-08 ENCOUNTER — Encounter: Payer: Self-pay | Admitting: Cardiology

## 2021-10-08 VITALS — BP 107/70 | HR 81 | Temp 98.7°F | Resp 16 | Ht 74.0 in | Wt 302.0 lb

## 2021-10-08 DIAGNOSIS — Z87891 Personal history of nicotine dependence: Secondary | ICD-10-CM

## 2021-10-08 DIAGNOSIS — I5032 Chronic diastolic (congestive) heart failure: Secondary | ICD-10-CM

## 2021-10-08 DIAGNOSIS — E1165 Type 2 diabetes mellitus with hyperglycemia: Secondary | ICD-10-CM

## 2021-10-08 DIAGNOSIS — G4733 Obstructive sleep apnea (adult) (pediatric): Secondary | ICD-10-CM

## 2021-10-08 DIAGNOSIS — I10 Essential (primary) hypertension: Secondary | ICD-10-CM

## 2021-10-08 DIAGNOSIS — E782 Mixed hyperlipidemia: Secondary | ICD-10-CM

## 2021-10-08 NOTE — Progress Notes (Signed)
Melvin Hudson Date of Birth: 1955/03/07 MRN: [id number] Primary Care Provider:Reese, Zadie Cleverly, MD Former Cardiology Providers: Jeri Lager, APRN, FNP-C Primary Cardiologist: Rex Kras, DO, [facility] (established care 09/13/2019)  Date: 10/08/21 Last Office Visit: 04/10/2021  Chief Complaint  Patient presents with  . heart failure management  . Follow-up    HPI  Melvin Hudson is a 66 y.o.  male who presents to the office with a chief complaint of "76-monthfollow-up heart failure/hyperlipidemia." Patient's past medical history and cardiovascular risk factors include: Hypertension, diabetes mellitus, former tobacco use, history of throat cancer diagnosis in 2015, no significant epicardial coronary disease per angiography in 2012, chronic heart failure with preserved EF, OSA on CPAP, advanced age.  Patient presents today for 636-monthollow-up visit for management of HFpEF and hyperlipidemia.  Since last office visit he is doing well from a cardiovascular standpoint.  No hospitalizations or urgent care visits for cardiac symptoms.  He has increase his physical activity and now walking at least 2 miles 5 days a week and has resulted in weight loss for which she is congratulated for today's office visit.  Patient is taking his current medical therapy without fail.  Requesting samples for FaWilder Gladef possible.  He also had labs with PCP in the recent past I do not have those records for review.   FUNCTIONAL STATUS: Walks at least 2 miles 5 days a week.  ALLERGIES: Allergies  Allergen Reactions  . Azilsartan Itching    Avoid ARB and ACEI per MD  . Advil [Ibuprofen]     Feels "jittery"    MEDICATION LIST PRIOR TO VISIT: Current Outpatient Medications on File Prior to Visit  Medication Sig Dispense Refill  . allopurinol (ZYLOPRIM) 300 MG tablet Take 300 mg by mouth daily as needed.     . Marland Kitchenspirin 81 MG tablet Take 81 mg by mouth daily.     . Marland Kitchentorvastatin (LIPITOR) 40 MG tablet Take 1 tablet  (40 mg total) by mouth at bedtime. 90 tablet 1  . BIDIL 20-37.5 MG tablet TAKE 1 TABLET BY MOUTH THREE TIMES DAILY 90 tablet 0  . Blood Glucose Monitoring Suppl (ONETOUCH VERIO FLEX SYSTEM) w/Device KIT USE TO CHECK BLOOD SUGAR TWICE DAILY    . Blood Glucose Monitoring Suppl (ONETOUCH VERIO FLEX SYSTEM) w/Device KIT USE TO CHECK BLOOD SUGAR TWICE DAILY    . calcium citrate-vitamin D 500-500 MG-UNIT chewable tablet Chew 1 tablet by mouth daily. 1/2 60079mt bedtime    . carvedilol (COREG) 25 MG tablet Take 25 mg by mouth 2 (two) times daily with a meal.    . diphenhydrAMINE (BENADRYL) 25 MG tablet Take 1 tablet (25 mg total) by mouth every 6 (six) hours. 20 tablet 0  . enalapril (VASOTEC) 5 MG tablet TAKE 1 TABLET(5 MG) BY MOUTH IN THE MORNING 90 tablet 0  . eplerenone (INSPRA) 50 MG tablet Take 50 mg by mouth daily.  2  . FARXIGA 10 MG TABS tablet Take 1 tablet (10 mg total) by mouth daily. 100 tablet 0  . gabapentin (NEURONTIN) 300 MG capsule Take 300 mg by mouth 3 (three) times daily.   2  . metFORMIN (GLUCOPHAGE) 500 MG tablet Take 500 mg by mouth 2 (two) times daily with a meal.     . NARCAN 4 MG/0.1ML LIQD nasal spray kit SMARTSIG:Both Nares    . NIFEdipine (ADALAT CC) 90 MG 24 hr tablet Take 90 mg by mouth daily.    . oMarland Kitcheneprazole (PRILOSEC) 20 MG capsule Take  20 mg by mouth daily.   4  . ONETOUCH VERIO test strip USE TO CHECK BLOOD SUGAR TWICE DAILY    . oxyCODONE-acetaminophen (PERCOCET) 10-325 MG per tablet Take 1 tablet by mouth every 6 (six) hours as needed for pain. (Patient taking differently: Take 1 tablet by mouth 4 (four) times daily.) 120 tablet 0  . OZEMPIC, 0.25 OR 0.5 MG/DOSE, 2 MG/1.5ML SOPN SMARTSIG:0.25 Milligram(s) SUB-Q Once a Week    . Vitamin D, Ergocalciferol, (DRISDOL) 1.25 MG (50000 UT) CAPS capsule Take 50,000 Units by mouth. Every Friday.     No current facility-administered medications on file prior to visit.    PAST MEDICAL HISTORY: Past Medical History:   Diagnosis Date  . Angioedema 09/17/11   tongue and lips  . Arthritis    "both of my legs and feet" (01/05/2014)  . CHF (congestive heart failure) ([place])   . Diabetes mellitus without complication ([place])   . Erectile dysfunction 04/20/2018  . GI bleed   . Heart murmur 04/20/2018  . Hx of gout   . Hypertension   . Obesity   . OSA on CPAP   . Pure hypercholesterolemia 04/20/2018  . S/P radiation therapy 07/26/2013-09/15/2013   Right tonsil/bilateral neck/ 7000 cGy  . 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 COLONOSCOPY Left 04/11/2014   Procedure: COLONOSCOPY;  Surgeon: Arta Silence, MD;  Location: [facility] ENDOSCOPY;  Service: Endoscopy;  Laterality: Left;  . COLONOSCOPY N/A 04/20/2014   Procedure: COLONOSCOPY;  Surgeon: Lear Ng, MD;  Location: [facility] ENDOSCOPY;  Service: Endoscopy;  Laterality: N/A;  . COLONOSCOPY W/ BIOPSIES AND POLYPECTOMY     benign  . DIRECT LARYNGOSCOPY  01/05/2014  . DIRECT LARYNGOSCOPY N/A 01/05/2014   Procedure: DIRECT LARYNGOSCOPY AND BIOPSY;  Surgeon: [name], MD;  Location: [facility];  Service: ENT;  Laterality: N/A;  . KNEE ARTHROSCOPY Right ~ 1994  . LEFT AND RIGHT HEART CATHETERIZATION WITH CORONARY/GRAFT ANGIOGRAM N/A 12/31/2010   Procedure: LEFT AND RIGHT HEART CATHETERIZATION WITH Beatrix Fetters;  Surgeon: Laverda Page, MD;  Location: [facility] CATH LAB;  Service: Cardiovascular;  Laterality: N/A;  . MULTIPLE EXTRACTIONS WITH ALVEOLOPLASTY N/A 07/08/2013   Procedure: Extraction of tooth #'s 22, 27 with alveoloplasty and bilateal mandibular facial exostoses reductions;  Surgeon: Lenn Cal, DDS;  Location: [facility] ORS;  Service: Oral Surgery;  Laterality: N/A;  . MULTIPLE TOOTH EXTRACTIONS  ~ 2008    FAMILY HISTORY: The patient's family history includes Aneurysm in his father; Cancer in his mother and sister; Diabetes in his father; Hypertension in his  mother; Stroke in his mother.   SOCIAL HISTORY:  The patient  reports that he quit smoking about 8 years ago. His smoking use included cigarettes and cigars. He has a 84.00 pack-year smoking history. He has never used smokeless tobacco. He reports that he does not drink alcohol and does not use drugs.  Review of Systems  Cardiovascular:  Negative for chest pain, dyspnea on exertion, leg swelling, near-syncope, orthopnea, palpitations, paroxysmal nocturnal dyspnea and syncope.  Respiratory:  Negative for shortness of breath.    PHYSICAL EXAM:    10/08/2021    9:45 AM 07/24/2021    1:05 PM 07/17/2021    9:23 AM  Vitals with BMI  Height 6' 2"     Weight 302 lbs    BMI 26.33    Systolic 354 562 563  Diastolic 70 70 72  Pulse 81 73 66   Physical Exam  Constitutional: No distress.  Age appropriate, hemodynamically stable.   Neck: No JVD present.  Cardiovascular: Normal rate, regular rhythm, S1 normal, S2 normal, intact distal pulses and normal pulses. Exam reveals no gallop, no S3 and no S4.  No murmur heard. Pulmonary/Chest: Effort normal and breath sounds normal. No stridor. He has no wheezes. He has no rales.  Abdominal: Soft. Bowel sounds are normal. He exhibits no distension. There is no abdominal tenderness.  Musculoskeletal:        General: No edema.     Cervical back: Neck supple.  Neurological: He is alert and oriented to person, place, and time. He has intact cranial nerves (2-12).  Skin: Skin is warm and moist.   CARDIAC DATABASE: EKG: 10/08/2021: Normal sinus rhythm, 80 bpm, right bundle branch block, left anterior fascicular block, left axis, left atrial enlargement, without underlying injury pattern.   Echocardiogram: 07/26/2018: LVEF 56%, moderate LVH, grade 1 diastolic impairment, normal left atrial pressure, mildly dilated left atrium.  Stress Testing:  Lexiscan Myoview stress test 06/28/2018: 1. Lexiscan stress test was performed. Exercise capacity was not assessed.  Stress symptoms included dyspnea, dizziness.  Resting blood pressure was 132/90 mmHg and peak effect blood pressure was 138/82 mmHg. The resting and stress electrocardiogram demonstrated sinus tachycardia, left anterior fascicular block, possible old posterior infarct, no resting arrhythmias and normal repolarization.  Stress EKG is non diagnostic for ischemia as it is a pharmacologic stress.  2. The overall quality of the study is fair.  Left ventricular cavity is noted to be normal on the rest and stress studies.  Gated SPECT images reveal normal myocardial thickening and wall motion.  The left ventricular ejection fraction was calculated or visually estimated to be 54%.  REST and STRESS images demonstrate decreased tracer uptake in the basal inferior and mid inferior segments of the left ventricle, likely due to tissue attenuation artifact (BMI 42). Ischemia in this region cannot be excluded. Recommend clinical correlation.  3. Low risk study.  Heart Catheterization: Coronary Angiogram  [12/31/2010]: Heart cath 12/31/10: Noral LVEF. Slow flow in coronary arteries without stenosis. Mild pulmonary HTN.   LABORATORY DATA:    Latest Ref Rng & Units 09/03/2016   11:40 AM 09/03/2016    5:31 AM 09/03/2016   12:05 AM  CBC  Hemoglobin 13.0 - 17.0 g/dL 10.4  9.7  9.9   Hematocrit 39.0 - 52.0 % 30.3  28.1  28.4        Latest Ref Rng & Units 04/24/2021    9:12 AM 11/13/2020    8:41 AM 04/10/2020    8:24 AM  CMP  Glucose 70 - 99 mg/dL 104  99  90   BUN 8 - 27 mg/dL 14  9  11    Creatinine 0.76 - 1.27 mg/dL 1.12  0.89  0.86   Sodium 134 - 144 mmol/L 139  142  143   Potassium 3.5 - 5.2 mmol/L 4.9  4.3  4.3   Chloride 96 - 106 mmol/L 102  102  103   CO2 20 - 29 mmol/L 25  26  25    Calcium 8.6 - 10.2 mg/dL 9.5  9.1  9.4   Total Protein 6.0 - 8.5 g/dL  7.1  6.7   Total Bilirubin 0.0 - 1.2 mg/dL  0.6  0.6   Alkaline Phos 44 - 121 IU/L  181  165   AST 0 - 40 IU/L  18  15  ALT 0 - 44 IU/L  12  16      Lipid Panel     Component Value Date/Time   CHOL 97 (L) 11/13/2020 0841   TRIG 54 11/13/2020 0841   HDL 42 11/13/2020 0841   LDLCALC 42 11/13/2020 0841   LDLDIRECT 43 11/13/2020 0841   LABVLDL 13 11/13/2020 0841    No results found for: "HGBA1C" No components found for: "NTPROBNP" Lab Results  Component Value Date   TSH 1.160 04/16/2018   TSH 1.588 04/10/2017   TSH 1.089 04/11/2016    Cardiac Panel (last 3 results) No results for input(s): "CKTOTAL", "CKMB", "TROPONINIHS", "RELINDX" in the last 72 hours.  IMPRESSION:    ICD-10-CM   1. Chronic heart failure with preserved ejection fraction (HCC)  I50.32 EKG 12-Lead    2. Mixed hyperlipidemia  E78.2     3. Essential hypertension  I10     4. Type 2 diabetes mellitus with hyperglycemia, without long-term current use of insulin (HCC)  E11.65     5. Former smoker  Z87.891     76. OSA on CPAP  G47.33    Z99.89     7. Class 2 severe obesity due to excess calories with serious comorbidity and body mass index (BMI) of 38.0 to 38.9 in adult [facility])  E66.01    Z68.38        RECOMMENDATIONS: Buel Molder is a 66 y.o. male whose past medical history and cardiovascular risk factors include: Hypertension, diabetes mellitus, former tobacco use, history of throat cancer diagnosis in 2015, no significant epicardial coronary disease per angiography in 2012, chronic heart failure with preserved EF, OSA on CPAP, advanced age.  Chronic heart failure with preserved ejection fraction (HCC) Euvolemic. No hospitalizations since last office visit. Patient is started to ambulate daily and has not had any exertional chest pain or heart failure symptoms. Medications reconciled. Started on Farxiga at the last office visit which she is tolerated well. Reemphasized importance of a low-salt diet, strict I's and O's and daily weights.  Mixed hyperlipidemia Currently on atorvastatin.   He denies myalgia or other side effects. Had labs with  PCP but unaware if he has had a lipid profile.  Patient is asked to check if a lipid profile was done and to provide [place] a copy for reference otherwise we will check fasting lipid profile  Essential hypertension Office blood pressures are well controlled. Medications reconciled. No changes warranted at this time.  Type 2 diabetes mellitus with hyperglycemia, without long-term current use of insulin ([place]) Educated him on the importance of glycemic control. Currently on ACE inhibitor, statin therapy, Farxiga, Ozempic  Class 2 severe obesity due to excess calories with serious comorbidity and body mass index (BMI) of 38.0 to 38.9 in adult [facility]) Body mass index is 38.77 kg/m. I reviewed with the patient the importance of diet, regular physical activity/exercise, weight loss.   Patient is educated on increasing physical activity gradually as tolerated.  With the goal of moderate intensity exercise for 30 minutes a day 5 days a week. Patient has lost approximately 6 pounds since the last office visit for which she is congratulated for.    She is still cardiac rehab red urine and lab data is based on what the CT doctor status is is high because it being in here exercise he is like his he is like that the salt says says that it was a really close and that its leg greater to the point of the  cut off to have surgery so they are like we just have to wait till he sees a CT people so tell the wife and she is she has called late nearly every day I called her last week referred for Friday afternoon She has a the order and the other doctors at and cardiac said that they told her that is what she needed to be is trying to get the CT if they can see him soon the CT to the hospital he is now ambulating at least 2 miles 5 days a week. I have asked him to discuss considering weight loss management program with PCP for further guidance during his weight loss journey.   FINAL MEDICATION LIST END OF ENCOUNTER: No  orders of the defined types were placed in this encounter.    Current Outpatient Medications:  .  allopurinol (ZYLOPRIM) 300 MG tablet, Take 300 mg by mouth daily as needed. , Disp: , Rfl:  .  aspirin 81 MG tablet, Take 81 mg by mouth daily. , Disp: , Rfl:  .  atorvastatin (LIPITOR) 40 MG tablet, Take 1 tablet (40 mg total) by mouth at bedtime., Disp: 90 tablet, Rfl: 1 .  BIDIL 20-37.5 MG tablet, TAKE 1 TABLET BY MOUTH THREE TIMES DAILY, Disp: 90 tablet, Rfl: 0 .  Blood Glucose Monitoring Suppl ([place]) w/Device KIT, USE TO CHECK BLOOD SUGAR TWICE DAILY, Disp: , Rfl:  .  Blood Glucose Monitoring Suppl (ONETOUCH VERIO FLEX SYSTEM) w/Device KIT, USE TO CHECK BLOOD SUGAR TWICE DAILY, Disp: , Rfl:  .  calcium citrate-vitamin D 500-500 MG-UNIT chewable tablet, Chew 1 tablet by mouth daily. 1/2 633m at bedtime, Disp: , Rfl:  .  carvedilol (COREG) 25 MG tablet, Take 25 mg by mouth 2 (two) times daily with a meal., Disp: , Rfl:  .  diphenhydrAMINE (BENADRYL) 25 MG tablet, Take 1 tablet (25 mg total) by mouth every 6 (six) hours., Disp: 20 tablet, Rfl: 0 .  enalapril (VASOTEC) 5 MG tablet, TAKE 1 TABLET(5 MG) BY MOUTH IN THE MORNING, Disp: 90 tablet, Rfl: 0 .  eplerenone (INSPRA) 50 MG tablet, Take 50 mg by mouth daily., Disp: , Rfl: 2 .  FARXIGA 10 MG TABS tablet, Take 1 tablet (10 mg total) by mouth daily., Disp: 100 tablet, Rfl: 0 .  gabapentin (NEURONTIN) 300 MG capsule, Take 300 mg by mouth 3 (three) times daily. , Disp: , Rfl: 2 .  metFORMIN (GLUCOPHAGE) 500 MG tablet, Take 500 mg by mouth 2 (two) times daily with a meal. , Disp: , Rfl:  .  NARCAN 4 MG/0.1ML LIQD nasal spray kit, SMARTSIG:Both Nares, Disp: , Rfl:  .  NIFEdipine (ADALAT CC) 90 MG 24 hr tablet, Take 90 mg by mouth daily., Disp: , Rfl:  .  omeprazole (PRILOSEC) 20 MG capsule, Take 20 mg by mouth daily. , Disp: , Rfl: 4 .  ONETOUCH VERIO test strip, USE TO CHECK BLOOD SUGAR TWICE DAILY, Disp: , Rfl:  .   oxyCODONE-acetaminophen (PERCOCET) 10-325 MG per tablet, Take 1 tablet by mouth every 6 (six) hours as needed for pain. (Patient taking differently: Take 1 tablet by mouth 4 (four) times daily.), Disp: 120 tablet, Rfl: 0 .  OZEMPIC, 0.25 OR 0.5 MG/DOSE, 2 MG/1.5ML SOPN, SMARTSIG:0.25 Milligram(s) SUB-Q Once a Week, Disp: , Rfl:  .  Vitamin D, Ergocalciferol, (DRISDOL) 1.25 MG (50000 UT) CAPS capsule, Take 50,000 Units by mouth. Every Friday., Disp: , Rfl:   Orders Placed This Encounter  Procedures  . EKG 12-Lead   --Continue cardiac medications as reconciled in final medication list. --Return in about 6 months (around 04/10/2022) for Follow up, heart failure management.. Or sooner if needed. --Continue follow-up with your primary care physician regarding the management of your other chronic comorbid conditions.  Patient's questions and concerns were addressed to his satisfaction. He voices understanding of the instructions provided during this encounter.   This note was created using a voice recognition software as a result there may be grammatical errors inadvertently enclosed that do not reflect the nature of this encounter. Every attempt is made to correct such errors.  Rex Kras, [place], [facility]  Pager: [phone] Office: [phone]

## 2021-12-11 ENCOUNTER — Encounter (HOSPITAL_COMMUNITY): Payer: Medicare HMO | Admitting: Dentistry

## 2021-12-12 ENCOUNTER — Other Ambulatory Visit: Payer: Self-pay | Admitting: Cardiology

## 2021-12-12 DIAGNOSIS — I5032 Chronic diastolic (congestive) heart failure: Secondary | ICD-10-CM

## 2022-01-01 ENCOUNTER — Ambulatory Visit (INDEPENDENT_AMBULATORY_CARE_PROVIDER_SITE_OTHER): Payer: Medicare HMO | Admitting: Dentistry

## 2022-01-01 VITALS — BP 126/83 | HR 77 | Temp 97.8°F

## 2022-01-01 DIAGNOSIS — K08109 Complete loss of teeth, unspecified cause, unspecified class: Secondary | ICD-10-CM

## 2022-01-01 DIAGNOSIS — C099 Malignant neoplasm of tonsil, unspecified: Secondary | ICD-10-CM | POA: Diagnosis not present

## 2022-01-01 DIAGNOSIS — Z923 Personal history of irradiation: Secondary | ICD-10-CM | POA: Diagnosis not present

## 2022-01-01 DIAGNOSIS — Z0120 Encounter for dental examination and cleaning without abnormal findings: Secondary | ICD-10-CM

## 2022-01-01 DIAGNOSIS — Z452 Encounter for adjustment and management of vascular access device: Secondary | ICD-10-CM | POA: Insufficient documentation

## 2022-01-01 NOTE — Progress Notes (Signed)
[facility]     TODAY'S VISIT:   RECALL   PROCEDURES: Periodic exam/routine recall appointment ASSESSMENT: Soft tissue:  WNL Completely edentulous  PLAN/RECOMMENDATIONS: Continue with 1 year recall visits Denture adjustments as needed Call if any questions or concerns arise before next visit.   NEXT VISIT:  1 year edentulous recall    Service Date:   01/01/2022  Patient Name:   Melvin Hudson Date of Birth:   [birth year] Medical Record Number: [id number]   HISTORY OF PRESENT ILLNESS: Melvin Hudson is a very pleasant 66 y.o. 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  The patient presents today for a periodic exam.   Medical and dental history were reviewed with the patient.  The patient's last dental visit was on 07/24/2021 for a denture adjustment.   CHIEF COMPLAINT: Here for annual recall visit;  patient with no complaints.   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   Ill-fitting dentures 12/05/2020   Former smoker 11/11/2019   Healthcare maintenance 11/11/2019   Bilateral leg edema 04/21/2018   Heart murmur 04/20/2018   Erectile dysfunction 04/20/2018   Pure hypercholesterolemia 04/20/2018   Obesity, Class III, BMI 40-49.9 (morbid obesity) ([place])    BRBPR (bright red blood per rectum) 08/30/2016   Chronic pain syndrome    History of GI diverticular bleed 04/18/2014   Hematochezia 04/10/2014   Symptomatic anemia 04/08/2014   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   Mouth pain 07/18/2013   Weight loss 07/18/2013   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[place]) 06/29/2013   OSA (obstructive sleep apnea) 11/19/2011   Angio-edema 09/17/2011    HTN (hypertension) 09/17/2011   Gout 09/17/2011   Past Medical History:  Diagnosis Date   Angioedema 09/17/11   tongue and lips   Arthritis    "both of my legs and feet" (01/05/2014)   CHF (congestive heart failure) ([facility])    Diabetes mellitus without complication ([place])    Erectile dysfunction 04/20/2018   GI bleed    Heart murmur 04/20/2018   Hx of gout    Hypertension    Obesity    OSA on CPAP    Pure hypercholesterolemia 04/20/2018   S/P radiation therapy 07/26/2013-09/15/2013   Right tonsil/bilateral neck/ 7000 cGy   Squamous cell carcinoma of right tonsil ([place]) 06/14/13   Past Surgical History:  Procedure Laterality Date   COLONOSCOPY Left 04/11/2014   Procedure: COLONOSCOPY;  Surgeon: Arta Silence, MD;  Location[facility] ENDOSCOPY;  Service: Endoscopy;  Laterality: Left;   COLONOSCOPY N/A 04/20/2014   Procedure: COLONOSCOPY;  Surgeon: Lear Ng, MD;  Location: [facility] ENDOSCOPY;  Service: Endoscopy;  Laterality: N/A;   COLONOSCOPY W/ BIOPSIES AND POLYPECTOMY     benign   DIRECT LARYNGOSCOPY  01/05/2014   DIRECT LARYNGOSCOPY N/A 01/05/2014   Procedure: DIRECT LARYNGOSCOPY AND BIOPSY;  Surgeon: [name], MD;  Location: [place];  Service: ENT;  Laterality: N/A;   KNEE ARTHROSCOPY Right ~ [place] CORONARY/GRAFT ANGIOGRAM N/A 12/31/2010   Procedure: LEFT AND RIGHT HEART CATHETERIZATION WITH Beatrix Fetters;  Surgeon: Laverda Page, MD;  Location: [facility] CATH LAB;  Service: Cardiovascular;  Laterality: N/A;   MULTIPLE  EXTRACTIONS WITH ALVEOLOPLASTY N/A 07/08/2013   Procedure: Extraction of tooth #'s 22, 27 with alveoloplasty and bilateal mandibular facial exostoses reductions;  Surgeon: Lenn Cal, DDS;  Location: [facility] ORS;  Service: Oral Surgery;  Laterality: N/A;   MULTIPLE TOOTH EXTRACTIONS  ~ 2008   Allergies  Allergen Reactions   Azilsartan Itching    Avoid ARB and ACEI per MD   Advil [Ibuprofen]     Feels "jittery"    Current Outpatient Medications  Medication Sig Dispense Refill   allopurinol (ZYLOPRIM) 300 MG tablet Take 300 mg by mouth daily as needed.      aspirin 81 MG tablet Take 81 mg by mouth daily.      atorvastatin (LIPITOR) 40 MG tablet Take 1 tablet (40 mg total) by mouth at bedtime. 90 tablet 1   BIDIL 20-37.5 MG tablet TAKE 1 TABLET BY MOUTH THREE TIMES DAILY 90 tablet 0   Blood Glucose Monitoring Suppl (ONETOUCH VERIO FLEX SYSTEM) w/Device KIT USE TO CHECK BLOOD SUGAR TWICE DAILY     Blood Glucose Monitoring Suppl (ONETOUCH VERIO FLEX SYSTEM) w/Device KIT USE TO CHECK BLOOD SUGAR TWICE DAILY     calcium citrate-vitamin D 500-500 MG-UNIT chewable tablet Chew 1 tablet by mouth daily. 1/2 6108m at bedtime     carvedilol (COREG) 25 MG tablet Take 25 mg by mouth 2 (two) times daily with a meal.     diphenhydrAMINE (BENADRYL) 25 MG tablet Take 1 tablet (25 mg total) by mouth every 6 (six) hours. 20 tablet 0   enalapril (VASOTEC) 5 MG tablet TAKE 1 TABLET(5 MG) BY MOUTH IN THE MORNING 90 tablet 0   eplerenone (INSPRA) 50 MG tablet Take 50 mg by mouth daily.  2   FARXIGA 10 MG TABS tablet Take 1 tablet (10 mg total) by mouth daily. 100 tablet 0   gabapentin (NEURONTIN) 300 MG capsule Take 300 mg by mouth 3 (three) times daily.   2   metFORMIN (GLUCOPHAGE) 500 MG tablet Take 500 mg by mouth 2 (two) times daily with a meal.      NARCAN 4 MG/0.1ML LIQD nasal spray kit SMARTSIG:Both Nares     NIFEdipine (ADALAT CC) 90 MG 24 hr tablet Take 90 mg by mouth daily.     omeprazole (PRILOSEC) 20 MG capsule Take 20 mg by mouth daily.   4   ONETOUCH VERIO test strip USE TO CHECK BLOOD SUGAR TWICE DAILY     oxyCODONE-acetaminophen (PERCOCET) 10-325 MG per tablet Take 1 tablet by mouth every 6 (six) hours as needed for pain. (Patient taking differently: Take 1 tablet by mouth 4 (four) times daily.) 120 tablet 0   OZEMPIC, 0.25 OR 0.5 MG/DOSE, 2 MG/1.5ML SOPN SMARTSIG:0.25 Milligram(s) SUB-Q Once a Week      Vitamin D, Ergocalciferol, (DRISDOL) 1.25 MG (50000 UT) CAPS capsule Take 50,000 Units by mouth. Every Friday.     No current facility-administered medications for this visit.    LABS: Lab Results  Component Value Date   WBC 6.7 08/31/2016   HGB 10.4 (L) 09/03/2016   HCT 30.3 (L) 09/03/2016   MCV 83.0 08/31/2016   PLT 216 08/31/2016      Component Value Date/Time   NA 139 04/24/2021 0912   NA 139 08/15/2013 1428   K 4.9 04/24/2021 0912   K 4.0 08/15/2013 1428   CL 102 04/24/2021 0912   CO2 25 04/24/2021 0912   CO2 29 08/15/2013 1428   GLUCOSE 104 (H) 04/24/2021 0912  GLUCOSE 129 (H) 08/31/2016 1320   GLUCOSE 155 (H) 08/15/2013 1428   BUN 14 04/24/2021 0912   BUN 14.3 08/15/2013 1428   CREATININE 1.12 04/24/2021 0912   CREATININE 1.2 08/15/2013 1428   CALCIUM 9.5 04/24/2021 0912   CALCIUM 9.8 08/15/2013 1428   GFRNONAA 90 04/10/2020 0824   GFRAA 104 04/10/2020 0824   Lab Results  Component Value Date   INR 1.12 08/30/2016   INR 1.25 04/18/2014   INR 1.39 04/11/2014   No results found for: "PTT"  Social History   Socioeconomic History   Marital status: Single    Spouse name: Not on file   Number of children: 0   Years of education: Not on file   Highest education level: Not on file  Occupational History   Not on file  Tobacco Use   Smoking status: Former    Packs/day: 2.00    Years: 42.00    Total pack years: 84.00    Types: Cigarettes, Cigars    Quit date: 06/17/2013    Years since quitting: 8.5   Smokeless tobacco: Never  Vaping Use   Vaping Use: Never used  Substance and Sexual Activity   Alcohol use: Never   Drug use: No   Sexual activity: Not Currently  Other Topics Concern   Not on file  Social History Narrative   Not on file   Social Determinants of Health   Financial Resource Strain: Not on file  Food Insecurity: Not on file  Transportation Needs: No Transportation Needs (04/16/2018)   PRAPARE - Transportation    Lack of  Transportation (Medical): No    Lack of Transportation (Non-Medical): No  Physical Activity: Not on file  Stress: Not on file  Social Connections: Not on file  Intimate Partner Violence: Not At Risk (04/16/2018)   Humiliation, Afraid, Rape, and Kick questionnaire    Fear of Current or Ex-Partner: No    Emotionally Abused: No    Physically Abused: No    Sexually Abused: No   Family History  Problem Relation Age of Onset   Cancer Mother    Stroke Mother    Hypertension Mother    Diabetes Father    Aneurysm Father    Cancer Sister        breast ca     REVIEW OF SYSTEMS:  Reviewed with the patient as per HPI. PSYCH: Patient denies having dental phobia.   VITAL SIGNS: There were no vitals taken for this visit.   PHYSICAL EXAM:  Soft tissue exam completed and all updates were charted.  GENERAL:  Well-developed, comfortable and in no apparent distress. NEUROLOGICAL:  Alert and oriented to person, place and  time. EXTRAORAL:  Head size, head shape, facial symmetry, skin, complexion, pigmentation, conjunctiva, oral labia, parotid gland, submandibular gland, thyroid, cervical and preauricular lymph nodes, submandibular and submental lymph nodes, tonsillar and occipital lymph nodes and supraclavicular lymph nodes WNL. No swelling or lymphadenopathy.  TMJ asymptomatic without clicks or crepitations. INTRAORAL:  Soft tissues appear well-perfused and mucous membranes moist.  FOM and vestibules soft and not raised. Oral cavity without mass or lesion. No signs of infection, parulis, sinus tract, edema or erythema evident upon exam.   DENTAL EXAM:  Hard tissue and periodontal exam were completed and all updates were charted.  HARD TISSUE:  MISSING:  Completely edentulous maxilla and mandible REMOVABLE PROSTHODONTICS:  Existing complete maxillary and mandibular dentures. The patient reports good retention, esthetics and function.  Exam was performed by [facility]  Benson Norway, DMD with Leta Speller, DAII acting as scribe.   RADIOGRAPHIC EXAM:  None taken at today's visit.   ASSESSMENT:  Periodic dental exam History of head and neck radiation therapy Complete edentulism   PLAN AND RECOMMENDATIONS: I explained all significant findings of the dental exam with the patient and the recommended care including 1 year recall visit/oral cancer screening or denture adjustments as needed.  The patient verbalized understanding of all findings, discussion, and recommendations and is agreeable to this plan.  Continue with 1 year recall interval. Denture adjustments as needed. Call if questions or concerns arise before next visit.   NEXT VISIT:  1 year recall  All questions and concerns were invited and addressed.  The patient tolerated today's visit well and departed in stable condition.  -Sandi Mariscal, DMD

## 2022-03-28 ENCOUNTER — Ambulatory Visit: Payer: Medicare HMO

## 2022-03-28 DIAGNOSIS — I5032 Chronic diastolic (congestive) heart failure: Secondary | ICD-10-CM

## 2022-04-08 ENCOUNTER — Ambulatory Visit: Payer: Medicare HMO | Admitting: Internal Medicine

## 2022-04-08 ENCOUNTER — Encounter: Payer: Self-pay | Admitting: Cardiology

## 2022-04-08 ENCOUNTER — Ambulatory Visit: Payer: Medicare HMO | Admitting: Cardiology

## 2022-04-08 VITALS — BP 115/74 | HR 70 | Ht 74.0 in | Wt 311.0 lb

## 2022-04-08 DIAGNOSIS — I10 Essential (primary) hypertension: Secondary | ICD-10-CM

## 2022-04-08 DIAGNOSIS — E1165 Type 2 diabetes mellitus with hyperglycemia: Secondary | ICD-10-CM

## 2022-04-08 DIAGNOSIS — Z87891 Personal history of nicotine dependence: Secondary | ICD-10-CM

## 2022-04-08 DIAGNOSIS — I5032 Chronic diastolic (congestive) heart failure: Secondary | ICD-10-CM

## 2022-04-08 DIAGNOSIS — E782 Mixed hyperlipidemia: Secondary | ICD-10-CM

## 2022-04-08 DIAGNOSIS — G4733 Obstructive sleep apnea (adult) (pediatric): Secondary | ICD-10-CM

## 2022-04-08 MED ORDER — FARXIGA 10 MG PO TABS: 10.0000 mg | ORAL_TABLET | Freq: Every morning | ORAL | 0 refills | Status: AC

## 2022-04-08 NOTE — Progress Notes (Signed)
Melvin Hudson Date of Birth: 1955/03/05 MRN: [id number] Primary Care Provider:Reese, Zadie Cleverly, MD Former Cardiology Providers: Jeri Lager, APRN, FNP-C Primary Cardiologist: Melvin Kras, DO, [facility] (established care 09/13/2019)  Date: 04/08/22 Last Office Visit: 10/08/2021  Chief Complaint  Patient presents with   Chronic heart failure with preserved ejection fraction   Follow-up    6 month    HPI  Melvin Hudson is a 67 y.o.  male whose past medical history and cardiovascular risk factors include: Hypertension, diabetes mellitus, former tobacco use, history of throat cancer diagnosis in 2015, no significant epicardial coronary disease per angiography in 2012, chronic heart failure with preserved EF, OSA on CPAP, advanced age.  Patient presents today for 44-monthfollow-up visit given his underlying HFpEF and hyperlipidemia.  Since last office visit he is doing well from a cardiovascular standpoint.  His physical activity remains stable.  Most recent echocardiogram results reviewed and noted below.  Clinically doing well.  Overall functional capacity has reduced due to cold weather and he no longer is taking Ozempic as he ran out of prescription both of which have contributed to weight gain.  But he is very motivated to increase physical activity once the weather gets warmer.  FUNCTIONAL STATUS: Walks at least 2 miles 5 days a week.  ALLERGIES: Allergies  Allergen Reactions   Azilsartan Itching    Avoid ARB and ACEI per MD   Advil [Ibuprofen]     Feels "jittery"    MEDICATION LIST PRIOR TO VISIT: Current Outpatient Medications on File Prior to Visit  Medication Sig Dispense Refill   allopurinol (ZYLOPRIM) 300 MG tablet Take 300 mg by mouth daily.     aspirin 81 MG tablet Take 81 mg by mouth daily.      atorvastatin (LIPITOR) 40 MG tablet Take 1 tablet (40 mg total) by mouth at bedtime. 90 tablet 1   BIDIL 20-37.5 MG tablet TAKE 1 TABLET BY MOUTH THREE TIMES DAILY 90 tablet 0    Blood Glucose Monitoring Suppl (ONETOUCH VERIO FLEX SYSTEM) w/Device KIT USE TO CHECK BLOOD SUGAR TWICE DAILY     calcium citrate-vitamin D 500-500 MG-UNIT chewable tablet Chew 1 tablet by mouth daily. 1/2 6050mat bedtime     carvedilol (COREG) 25 MG tablet Take 25 mg by mouth 2 (two) times daily with a meal.     diphenhydrAMINE (BENADRYL) 25 MG tablet Take 1 tablet (25 mg total) by mouth every 6 (six) hours. 20 tablet 0   enalapril (VASOTEC) 5 MG tablet TAKE 1 TABLET(5 MG) BY MOUTH IN THE MORNING 90 tablet 0   eplerenone (INSPRA) 50 MG tablet Take 50 mg by mouth daily.  2   gabapentin (NEURONTIN) 300 MG capsule Take 300 mg by mouth 3 (three) times daily.   2   metFORMIN (GLUCOPHAGE) 500 MG tablet Take 500 mg by mouth 2 (two) times daily with a meal.      NARCAN 4 MG/0.1ML LIQD nasal spray kit SMARTSIG:Both Nares     NIFEdipine (ADALAT CC) 90 MG 24 hr tablet Take 90 mg by mouth daily.     omeprazole (PRILOSEC) 20 MG capsule Take 20 mg by mouth daily.   4   ONETOUCH VERIO test strip USE TO CHECK BLOOD SUGAR TWICE DAILY     oxyCODONE-acetaminophen (PERCOCET) 10-325 MG per tablet Take 1 tablet by mouth every 6 (six) hours as needed for pain. (Patient taking differently: Take 1 tablet by mouth 4 (four) times daily.) 120 tablet 0   OZEMPIC, 0.25  OR 0.5 MG/DOSE, 2 MG/1.5ML SOPN SMARTSIG:0.25 Milligram(s) SUB-Q Once a Week     Vitamin D, Ergocalciferol, (DRISDOL) 1.25 MG (50000 UT) CAPS capsule Take 50,000 Units by mouth. Every Friday.     No current facility-administered medications on file prior to visit.    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    PAST SURGICAL HISTORY: Past Surgical History:  Procedure Laterality Date   COLONOSCOPY Left 04/11/2014   Procedure: COLONOSCOPY;  Surgeon: Arta Silence, MD;  Location: [facility] ENDOSCOPY;  Service: Endoscopy;  Laterality: Left;   COLONOSCOPY N/A 04/20/2014   Procedure: COLONOSCOPY;  Surgeon: Lear Ng, MD;  Location: [facility] ENDOSCOPY;  Service: Endoscopy;  Laterality: N/A;   COLONOSCOPY W/ BIOPSIES AND POLYPECTOMY     benign   DIRECT LARYNGOSCOPY  01/05/2014   DIRECT LARYNGOSCOPY N/A 01/05/2014   Procedure: DIRECT LARYNGOSCOPY AND BIOPSY;  Surgeon: [name], MD;  Location: [place];  Service: ENT;  Laterality: N/A;   KNEE ARTHROSCOPY Right ~ [place] CORONARY/GRAFT ANGIOGRAM N/A 12/31/2010   Procedure: LEFT AND RIGHT HEART CATHETERIZATION WITH Beatrix Fetters;  Surgeon: Laverda Page, MD;  Location: [facility] CATH LAB;  Service: Cardiovascular;  Laterality: N/A;   MULTIPLE EXTRACTIONS WITH ALVEOLOPLASTY N/A 07/08/2013   Procedure: Extraction of tooth #'s 22, 27 with alveoloplasty and bilateal mandibular facial exostoses reductions;  Surgeon: Lenn Cal, DDS;  Location: [facility] ORS;  Service: Oral Surgery;  Laterality: N/A;   MULTIPLE TOOTH EXTRACTIONS  ~ 2008    FAMILY HISTORY: The patient's family history includes Aneurysm in his father; Cancer in his mother and sister; Diabetes in his father; Hypertension in his mother; Stroke in his mother.   SOCIAL HISTORY:  The patient  reports that he quit smoking about 8 years ago. His smoking use included cigarettes and cigars. He has a 84.00 pack-year smoking history. He has never used smokeless tobacco. He reports that he does not drink alcohol and does not use drugs.  Review of Systems  Cardiovascular:  Negative for chest  pain, dyspnea on exertion, leg swelling, near-syncope, orthopnea, palpitations, paroxysmal nocturnal dyspnea and syncope.  Respiratory:  Negative for shortness of breath.    PHYSICAL EXAM:    04/08/2022    9:32 AM 01/01/2022    9:28 AM 10/08/2021    9:45 AM  Vitals with BMI  Height 6' 2"$   6' 2"$   Weight 311 lbs  302 lbs  BMI 0000000  [id number]  Systolic [id number]  Diastolic 74 83 70  Pulse 70 77 81   Physical Exam  Constitutional: No distress.  Age appropriate, hemodynamically stable.   Neck: No JVD present.  Cardiovascular: Normal rate, regular rhythm, S1 normal, S2 normal, intact distal pulses and normal pulses. Exam reveals no gallop, no S3 and no S4.  No murmur heard. Pulmonary/Chest: Effort normal and breath sounds normal. No  stridor. He has no wheezes. He has no rales.  Abdominal: Soft. Bowel sounds are normal. He exhibits no distension. There is no abdominal tenderness.  Musculoskeletal:        General: No edema.     Cervical back: Neck supple.  Neurological: He is alert and oriented to person, place, and time. He has intact cranial nerves (2-12).  Skin: Skin is warm and moist.   CARDIAC DATABASE: EKG: 04/08/2022: Sinus rhythm, 60 bpm, left axis, left anterior fascicular block, right bundle branch block no significant change compared to prior ECG.  Echocardiogram: 03/28/2022: Left ventricle cavity is normal in size. Mild eccentric hypertrophy of the left ventricle. Normal global wall motion. Normal LV systolic function with visual EF 50-55%. Doppler evidence of grade I (impaired) diastolic dysfunction, normal LAP. The aortic root is mildly dilated at 3.7 cm. Mildly dilated ascending aorta at 4.0 cm. Ascending aorta was not well visualized on previous study in 2020. LA was reported mildly dilated then.   Stress Testing:  Lexiscan Myoview stress test 06/28/2018: 1. Lexiscan stress test was performed. Exercise capacity was not assessed. Stress symptoms included dyspnea,  dizziness.  Resting blood pressure was 132/90 mmHg and peak effect blood pressure was 138/82 mmHg. The resting and stress electrocardiogram demonstrated sinus tachycardia, left anterior fascicular block, possible old posterior infarct, no resting arrhythmias and normal repolarization.  Stress EKG is non diagnostic for ischemia as it is a pharmacologic stress.  2. The overall quality of the study is fair.  Left ventricular cavity is noted to be normal on the rest and stress studies.  Gated SPECT images reveal normal myocardial thickening and wall motion.  The left ventricular ejection fraction was calculated or visually estimated to be 54%.  REST and STRESS images demonstrate decreased tracer uptake in the basal inferior and mid inferior segments of the left ventricle, likely due to tissue attenuation artifact (BMI 42). Ischemia in this region cannot be excluded. Recommend clinical correlation.  3. Low risk study.  Heart Catheterization: Coronary Angiogram  [12/31/2010]: Heart cath 12/31/10: Noral LVEF. Slow flow in coronary arteries without stenosis. Mild pulmonary HTN.   LABORATORY DATA:    Latest Ref Rng & Units 09/03/2016   11:40 AM 09/03/2016    5:31 AM 09/03/2016   12:05 AM  CBC  Hemoglobin 13.0 - 17.0 g/dL 10.4  9.7  9.9   Hematocrit 39.0 - 52.0 % 30.3  28.1  28.4        Latest Ref Rng & Units 04/24/2021    9:12 AM 11/13/2020    8:41 AM 04/10/2020    8:24 AM  CMP  Glucose 70 - 99 mg/dL 104  99  90   BUN 8 - 27 mg/dL 14  9  11   $ Creatinine 0.76 - 1.27 mg/dL 1.12  0.89  0.86   Sodium 134 - 144 mmol/L 139  142  143   Potassium 3.5 - 5.2 mmol/L 4.9  4.3  4.3   Chloride 96 - 106 mmol/L 102  102  103   CO2 20 - 29 mmol/L 25  26  25   $ Calcium 8.6 - 10.2 mg/dL 9.5  9.1  9.4   Total Protein 6.0 - 8.5 g/dL  7.1  6.7   Total Bilirubin 0.0 - 1.2 mg/dL  0.6  0.6   Alkaline Phos 44 - 121 IU/L  181  165   AST 0 - 40 IU/L  18  15   ALT 0 - 44 IU/L  12  16     Lipid Panel     Component Value  Date/Time   CHOL 97 (L) 11/13/2020 0841   TRIG 54 11/13/2020 0841   HDL 42 11/13/2020 0841   LDLCALC 42 11/13/2020 0841   LDLDIRECT 43 11/13/2020 0841   LABVLDL 13 11/13/2020 0841    No results found for: "HGBA1C" No components found for: "NTPROBNP" Lab Results  Component Value Date   TSH 1.160 04/16/2018   TSH 1.588 04/10/2017   TSH 1.089 04/11/2016    Cardiac Panel (last 3 results) No results for input(s): "CKTOTAL", "CKMB", "TROPONINIHS", "RELINDX" in the last 72 hours.  IMPRESSION:    ICD-10-CM   1. Chronic heart failure with preserved ejection fraction (HCC)  I50.32 EKG 12-Lead    FARXIGA 10 MG TABS tablet    2. Mixed hyperlipidemia  E78.2     3. Essential hypertension  I10     4. Type 2 diabetes mellitus with hyperglycemia, without long-term current use of insulin (HCC)  E11.65     5. Former smoker  Z87.891     61. OSA on CPAP  G47.33     7. Class 2 severe obesity due to excess calories with serious comorbidity and body mass index (BMI) of 39.0 to 39.9 in adult [facility])  E66.01    Z68.39        RECOMMENDATIONS: Melvin Hudson is a 67 y.o. male whose past medical history and cardiovascular risk factors include: Hypertension, diabetes mellitus, former tobacco use, history of throat cancer diagnosis in 2015, no significant epicardial coronary disease per angiography in 2012, chronic heart failure with preserved EF, OSA on CPAP, advanced age.  Chronic heart failure with preserved ejection fraction (HCC) Euvolemic. No hospitalizations since last office visit. Weight gain due to decreased physical activity due to cold weather and currently not on Ozempic. Medications reconciled. Samples for Farxiga provided and refill sent to pharmacy Echocardiogram notes preserved EF with grade 1 diastolic impairment and no significant valvular heart disease. Reemphasized importance of a low-salt diet, strict I's and O's and daily weights.  Mixed hyperlipidemia Currently on  atorvastatin.   He denies myalgia or other side effects. No recent lipid profiles available for review.  Essential hypertension Office blood pressures are well controlled. Medications reconciled. No changes warranted at this time.  Type 2 diabetes mellitus with hyperglycemia, without long-term current use of insulin ([place]) Educated him on the importance of glycemic control. Currently on ACE inhibitor, statin therapy, Farxiga, Ozempic. Patient is out of his Ozempic.  He is not sure if he ran out of a refill, it is cost prohibitive, or needs prior authorization.  He will look into this a little more in detail with his pharmacy and reach out to either his PCP or our office for further guidance.   FINAL MEDICATION LIST END OF ENCOUNTER: Meds ordered this encounter  Medications   FARXIGA 10 MG TABS tablet    Sig: Take 1 tablet (10 mg total) by mouth every morning.    Dispense:  90 tablet    Refill:  0     Current Outpatient Medications:    allopurinol (ZYLOPRIM) 300 MG tablet, Take 300 mg by mouth daily., Disp: , Rfl:    aspirin 81 MG tablet, Take 81 mg by mouth daily. , Disp: , Rfl:    atorvastatin (LIPITOR) 40 MG tablet, Take 1 tablet (40 mg total) by mouth at bedtime., Disp: 90 tablet, Rfl: 1   BIDIL 20-37.5 MG tablet, TAKE 1 TABLET BY MOUTH  THREE TIMES DAILY, Disp: 90 tablet, Rfl: 0   Blood Glucose Monitoring Suppl (ONETOUCH VERIO FLEX SYSTEM) w/Device KIT, USE TO CHECK BLOOD SUGAR TWICE DAILY, Disp: , Rfl:    calcium citrate-vitamin D 500-500 MG-UNIT chewable tablet, Chew 1 tablet by mouth daily. 1/2 654m at bedtime, Disp: , Rfl:    carvedilol (COREG) 25 MG tablet, Take 25 mg by mouth 2 (two) times daily with a meal., Disp: , Rfl:    diphenhydrAMINE (BENADRYL) 25 MG tablet, Take 1 tablet (25 mg total) by mouth every 6 (six) hours., Disp: 20 tablet, Rfl: 0   enalapril (VASOTEC) 5 MG tablet, TAKE 1 TABLET(5 MG) BY MOUTH IN THE MORNING, Disp: 90 tablet, Rfl: 0   eplerenone (INSPRA) 50  MG tablet, Take 50 mg by mouth daily., Disp: , Rfl: 2   gabapentin (NEURONTIN) 300 MG capsule, Take 300 mg by mouth 3 (three) times daily. , Disp: , Rfl: 2   metFORMIN (GLUCOPHAGE) 500 MG tablet, Take 500 mg by mouth 2 (two) times daily with a meal. , Disp: , Rfl:    NARCAN 4 MG/0.1ML LIQD nasal spray kit, SMARTSIG:Both Nares, Disp: , Rfl:    NIFEdipine (ADALAT CC) 90 MG 24 hr tablet, Take 90 mg by mouth daily., Disp: , Rfl:    omeprazole (PRILOSEC) 20 MG capsule, Take 20 mg by mouth daily. , Disp: , Rfl: 4   ONETOUCH VERIO test strip, USE TO CHECK BLOOD SUGAR TWICE DAILY, Disp: , Rfl:    oxyCODONE-acetaminophen (PERCOCET) 10-325 MG per tablet, Take 1 tablet by mouth every 6 (six) hours as needed for pain. (Patient taking differently: Take 1 tablet by mouth 4 (four) times daily.), Disp: 120 tablet, Rfl: 0   OZEMPIC, 0.25 OR 0.5 MG/DOSE, 2 MG/1.5ML SOPN, SMARTSIG:0.25 Milligram(s) SUB-Q Once a Week, Disp: , Rfl:    Vitamin D, Ergocalciferol, (DRISDOL) 1.25 MG (50000 UT) CAPS capsule, Take 50,000 Units by mouth. Every Friday., Disp: , Rfl:    FARXIGA 10 MG TABS tablet, Take 1 tablet (10 mg total) by mouth every morning., Disp: 90 tablet, Rfl: 0  Orders Placed This Encounter  Procedures   EKG 12-Lead   --Continue cardiac medications as reconciled in final medication list. --Return in about 6 months (around 10/07/2022) for Follow up, heart failure management.. Or sooner if needed. --Continue follow-up with your primary care physician regarding the management of your other chronic comorbid conditions.  Patient's questions and concerns were addressed to his satisfaction. He voices understanding of the instructions provided during this encounter.   This note was created using a voice recognition software as a result there may be grammatical errors inadvertently enclosed that do not reflect the nature of this encounter. Every attempt is made to correct such errors.  SRex Hudson DNevada FTexas Gi Endoscopy Center Pager:  [phone]: [phone]

## 2022-04-13 ENCOUNTER — Inpatient Hospital Stay (HOSPITAL_COMMUNITY): Payer: Medicare Other

## 2022-04-13 ENCOUNTER — Other Ambulatory Visit: Payer: Self-pay

## 2022-04-13 ENCOUNTER — Encounter (HOSPITAL_COMMUNITY): Payer: Self-pay

## 2022-04-13 ENCOUNTER — Inpatient Hospital Stay (HOSPITAL_COMMUNITY)
Admission: EM | Admit: 2022-04-13 | Discharge: 2022-04-15 | DRG: 378 | Disposition: A | Discharge status: Home / Self Care | Payer: Medicare Other | Attending: Internal Medicine | Admitting: Internal Medicine

## 2022-04-13 ENCOUNTER — Emergency Department (HOSPITAL_COMMUNITY): Payer: Medicare Other

## 2022-04-13 DIAGNOSIS — I5032 Chronic diastolic (congestive) heart failure: Secondary | ICD-10-CM | POA: Diagnosis not present

## 2022-04-13 DIAGNOSIS — Z79899 Other long term (current) drug therapy: Secondary | ICD-10-CM | POA: Diagnosis not present

## 2022-04-13 DIAGNOSIS — E119 Type 2 diabetes mellitus without complications: Secondary | ICD-10-CM | POA: Diagnosis not present

## 2022-04-13 DIAGNOSIS — Z7985 Long-term (current) use of injectable non-insulin antidiabetic drugs: Secondary | ICD-10-CM

## 2022-04-13 DIAGNOSIS — Z8249 Family history of ischemic heart disease and other diseases of the circulatory system: Secondary | ICD-10-CM

## 2022-04-13 DIAGNOSIS — I959 Hypotension, unspecified: Secondary | ICD-10-CM | POA: Diagnosis not present

## 2022-04-13 DIAGNOSIS — E669 Obesity, unspecified: Secondary | ICD-10-CM | POA: Diagnosis not present

## 2022-04-13 DIAGNOSIS — Z85818 Personal history of malignant neoplasm of other sites of lip, oral cavity, and pharynx: Secondary | ICD-10-CM | POA: Diagnosis not present

## 2022-04-13 DIAGNOSIS — F419 Anxiety disorder, unspecified: Secondary | ICD-10-CM | POA: Diagnosis not present

## 2022-04-13 DIAGNOSIS — K5731 Diverticulosis of large intestine without perforation or abscess with bleeding: Principal | ICD-10-CM | POA: Diagnosis present

## 2022-04-13 DIAGNOSIS — D509 Iron deficiency anemia, unspecified: Secondary | ICD-10-CM | POA: Diagnosis present

## 2022-04-13 DIAGNOSIS — E7800 Pure hypercholesterolemia, unspecified: Secondary | ICD-10-CM | POA: Diagnosis present

## 2022-04-13 DIAGNOSIS — Z7984 Long term (current) use of oral hypoglycemic drugs: Secondary | ICD-10-CM | POA: Diagnosis not present

## 2022-04-13 DIAGNOSIS — Z888 Allergy status to other drugs, medicaments and biological substances status: Secondary | ICD-10-CM | POA: Diagnosis not present

## 2022-04-13 DIAGNOSIS — M109 Gout, unspecified: Secondary | ICD-10-CM | POA: Diagnosis not present

## 2022-04-13 DIAGNOSIS — D61818 Other pancytopenia: Secondary | ICD-10-CM | POA: Diagnosis present

## 2022-04-13 DIAGNOSIS — Z7982 Long term (current) use of aspirin: Secondary | ICD-10-CM | POA: Diagnosis not present

## 2022-04-13 DIAGNOSIS — Z923 Personal history of irradiation: Secondary | ICD-10-CM

## 2022-04-13 DIAGNOSIS — I110 Hypertensive heart disease with heart failure: Secondary | ICD-10-CM | POA: Diagnosis present

## 2022-04-13 DIAGNOSIS — G4733 Obstructive sleep apnea (adult) (pediatric): Secondary | ICD-10-CM | POA: Diagnosis not present

## 2022-04-13 DIAGNOSIS — K922 Gastrointestinal hemorrhage, unspecified: Secondary | ICD-10-CM | POA: Diagnosis present

## 2022-04-13 DIAGNOSIS — Z87891 Personal history of nicotine dependence: Secondary | ICD-10-CM

## 2022-04-13 DIAGNOSIS — Z1152 Encounter for screening for COVID-19: Secondary | ICD-10-CM

## 2022-04-13 DIAGNOSIS — Z833 Family history of diabetes mellitus: Secondary | ICD-10-CM

## 2022-04-13 DIAGNOSIS — Z6838 Body mass index (BMI) 38.0-38.9, adult: Secondary | ICD-10-CM

## 2022-04-13 LAB — CBC WITH DIFFERENTIAL/PLATELET
Abs Immature Granulocytes: 0.03 10*3/uL (ref 0.00–0.07)
Abs Immature Granulocytes: 0.08 10*3/uL — ABNORMAL HIGH (ref 0.00–0.07)
Basophils Absolute: 0 10*3/uL (ref 0.0–0.1)
Basophils Absolute: 0 10*3/uL (ref 0.0–0.1)
Basophils Relative: 1 %
Basophils Relative: 0 %
Eosinophils Absolute: 0 10*3/uL (ref 0.0–0.5)
Eosinophils Absolute: 0.1 10*3/uL (ref 0.0–0.5)
Eosinophils Relative: 1 %
Eosinophils Relative: 1 %
HCT: 28.4 % — ABNORMAL LOW (ref 39.0–52.0)
HCT: 29.2 % — ABNORMAL LOW (ref 39.0–52.0)
Hemoglobin: 9.1 g/dL — ABNORMAL LOW (ref 13.0–17.0)
Hemoglobin: 10 g/dL — ABNORMAL LOW (ref 13.0–17.0)
Immature Granulocytes: 2 %
Immature Granulocytes: 1 %
Lymphocytes Relative: 31 %
Lymphocytes Relative: 15 %
Lymphs Abs: 0.6 10*3/uL — ABNORMAL LOW (ref 0.7–4.0)
Lymphs Abs: 1.6 10*3/uL (ref 0.7–4.0)
MCH: 25.1 pg — ABNORMAL LOW (ref 26.0–34.0)
MCH: 26 pg (ref 26.0–34.0)
MCHC: 32 g/dL (ref 30.0–36.0)
MCHC: 34.2 g/dL (ref 30.0–36.0)
MCV: 78.2 fL — ABNORMAL LOW (ref 80.0–100.0)
MCV: 76 fL — ABNORMAL LOW (ref 80.0–100.0)
Monocytes Absolute: 0.1 10*3/uL (ref 0.1–1.0)
Monocytes Absolute: 0.8 10*3/uL (ref 0.1–1.0)
Monocytes Relative: 4 %
Monocytes Relative: 7 %
Neutro Abs: 1.3 10*3/uL — ABNORMAL LOW (ref 1.7–7.7)
Neutro Abs: 8 10*3/uL — ABNORMAL HIGH (ref 1.7–7.7)
Neutrophils Relative %: 61 %
Neutrophils Relative %: 76 %
Platelets: 7 10*3/uL — CL (ref 150–400)
Platelets: 259 10*3/uL (ref 150–400)
RBC: 3.63 MIL/uL — ABNORMAL LOW (ref 4.22–5.81)
RBC: 3.84 MIL/uL — ABNORMAL LOW (ref 4.22–5.81)
RDW: 18.1 % — ABNORMAL HIGH (ref 11.5–15.5)
RDW: 18.2 % — ABNORMAL HIGH (ref 11.5–15.5)
WBC: 2 10*3/uL — ABNORMAL LOW (ref 4.0–10.5)
WBC: 10.6 10*3/uL — ABNORMAL HIGH (ref 4.0–10.5)
nRBC: 3 % — ABNORMAL HIGH (ref 0.0–0.2)
nRBC: 0.2 % (ref 0.0–0.2)

## 2022-04-13 LAB — RESP PANEL BY RT-PCR (RSV, FLU A&B, COVID)  RVPGX2
Influenza A by PCR: NEGATIVE
Influenza B by PCR: NEGATIVE
Resp Syncytial Virus by PCR: NEGATIVE
SARS Coronavirus 2 by RT PCR: NEGATIVE

## 2022-04-13 LAB — COMPREHENSIVE METABOLIC PANEL
ALT: 17 U/L (ref 0–44)
AST: 30 U/L (ref 15–41)
Albumin: 3.5 g/dL (ref 3.5–5.0)
Alkaline Phosphatase: 121 U/L (ref 38–126)
Anion gap: 7 (ref 5–15)
BUN: 13 mg/dL (ref 8–23)
CO2: 26 mmol/L (ref 22–32)
Calcium: 8.7 mg/dL — ABNORMAL LOW (ref 8.9–10.3)
Chloride: 104 mmol/L (ref 98–111)
Creatinine, Ser: 1.21 mg/dL (ref 0.61–1.24)
GFR, Estimated: >60 mL/min (ref >60)
Glucose, Bld: 138 mg/dL — ABNORMAL HIGH (ref 70–99)
Potassium: 5.2 mmol/L — ABNORMAL HIGH (ref 3.5–5.1)
Sodium: 137 mmol/L (ref 135–145)
Total Bilirubin: 0.9 mg/dL (ref 0.3–1.2)
Total Protein: 6 g/dL — ABNORMAL LOW (ref 6.5–8.1)

## 2022-04-13 LAB — I-STAT CHEM 8, ED
BUN: 12 mg/dL (ref 8–23)
Calcium, Ion: 1.12 mmol/L — ABNORMAL LOW (ref 1.15–1.40)
Chloride: 105 mmol/L (ref 98–111)
Creatinine, Ser: 1.1 mg/dL (ref 0.61–1.24)
Glucose, Bld: 146 mg/dL — ABNORMAL HIGH (ref 70–99)
HCT: 31 % — ABNORMAL LOW (ref 39.0–52.0)
Hemoglobin: 10.5 g/dL — ABNORMAL LOW (ref 13.0–17.0)
Potassium: 4.6 mmol/L (ref 3.5–5.1)
Sodium: 140 mmol/L (ref 135–145)
TCO2: 26 mmol/L (ref 22–32)

## 2022-04-13 LAB — BASIC METABOLIC PANEL
Anion gap: 8 (ref 5–15)
BUN: 13 mg/dL (ref 8–23)
CO2: 25 mmol/L (ref 22–32)
Calcium: 8.8 mg/dL — ABNORMAL LOW (ref 8.9–10.3)
Chloride: 106 mmol/L (ref 98–111)
Creatinine, Ser: 0.99 mg/dL (ref 0.61–1.24)
GFR, Estimated: >60 mL/min (ref >60)
Glucose, Bld: 90 mg/dL (ref 70–99)
Potassium: 4.4 mmol/L (ref 3.5–5.1)
Sodium: 139 mmol/L (ref 135–145)

## 2022-04-13 LAB — TECHNOLOGIST SMEAR REVIEW

## 2022-04-13 LAB — POC OCCULT BLOOD, ED: Fecal Occult Bld: POSITIVE — AB

## 2022-04-13 LAB — PROTIME-INR
INR: 1.6 — ABNORMAL HIGH (ref 0.8–1.2)
Prothrombin Time: 18.8 seconds — ABNORMAL HIGH (ref 11.4–15.2)

## 2022-04-13 LAB — APTT: aPTT: 28 seconds (ref 24–36)

## 2022-04-13 LAB — HIV ANTIBODY (ROUTINE TESTING W REFLEX): HIV Screen 4th Generation wRfx: NONREACTIVE

## 2022-04-13 LAB — VITAMIN B12: Vitamin B-12: 405 pg/mL (ref 180–914)

## 2022-04-13 LAB — GLUCOSE, CAPILLARY: Glucose-Capillary: 96 mg/dL (ref 70–99)

## 2022-04-13 LAB — FOLATE: Folate: 26.6 ng/mL (ref >5.9)

## 2022-04-13 LAB — PREPARE RBC (CROSSMATCH)

## 2022-04-13 LAB — LACTATE DEHYDROGENASE: LDH: 133 U/L (ref 98–192)

## 2022-04-13 MED ORDER — SODIUM CHLORIDE 0.9 % IV BOLUS
1000.0000 mL | Freq: Once | INTRAVENOUS | Status: AC
  Administered 2022-04-13: 1000 mL via INTRAVENOUS

## 2022-04-13 MED ORDER — ACETAMINOPHEN 325 MG PO TABS: 650.0000 mg | ORAL_TABLET | Freq: Four times a day (QID) | ORAL | Status: DC | PRN

## 2022-04-13 MED ORDER — LACTATED RINGERS IV BOLUS
1000.0000 mL | Freq: Once | INTRAVENOUS | Status: AC
  Administered 2022-04-13: 1000 mL via INTRAVENOUS

## 2022-04-13 MED ORDER — SODIUM CHLORIDE 0.9% IV SOLUTION: Freq: Once | INTRAVENOUS | Status: AC

## 2022-04-13 MED ORDER — IOHEXOL 350 MG/ML SOLN
75.0000 mL | Freq: Once | INTRAVENOUS | Status: AC | PRN
  Administered 2022-04-13: 75 mL via INTRAVENOUS

## 2022-04-13 MED ORDER — SODIUM CHLORIDE 0.9% IV SOLUTION: Freq: Once | INTRAVENOUS | Status: DC

## 2022-04-13 MED ORDER — ACETAMINOPHEN 650 MG RE SUPP: 650.0000 mg | Freq: Four times a day (QID) | RECTAL | Status: DC | PRN

## 2022-04-13 NOTE — H&P (Signed)
Date: 04/13/2022               Patient Name:  Melvin Hudson MRN: [id number]  DOB: 10/24/1955 Age / Sex: 67 y.o., male   PCP: [name], MD         Medical Service: Internal Medicine Teaching Service         Attending Physician: Dr. Charise Killian, MD      First Contact: Dr. Johny Blamer, DO Pager [phone]    Second Contact: Dr. 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  He states that about midnight last night he woke up and had a large bloody bowel movement without any pain.  Since then he has had bowel movements about every 15-20 minutes.  He has not had any in the ED.  He has not had any abdominal pain or rectal pain.  He has had severe fatigue but no syncope, chest pain, shortness of breath, falls, weakness.  He denies any known inciting factor or any changes in his medicines recently.  He does take a daily aspirin 81 mg but otherwise is not on any antiplatelets or blood thinners.  He has not had any other abnormal bleeding including small cuts that will stop bleeding, mucosal bleeding, or any others.  Of note in 2016 he had 2 admissions for lower GI bleed that required blood transfusion.  Colonoscopy at that time found diverticulosis and internal hemorrhoids.  ED Course: Patient presented as above with initial blood pressures normotensive but subsequently became hypotensive at 84/58.  Otherwise hemodynamically stable without oxygen needs.  In the ED did not have any bloody bowel movements.  Initial labs showed hemoglobin of 9.1 with platelet  count of 7 with a BUN of 13 and creatinine of 1.21.  INR 1.6.  He was given a unit of blood and platelet transfusion.  GI was paged in the ED.  IMTS was paged for admission.  Past Medical History Hypertension Type 2 diabetes HFpEF History of squamous cell tonsillar cancer s/p radiation in 2016 Diverticulosis with prior diverticular bleeds Gout OSA Former tobacco use disorder  Meds:  Aspirin 81 mg daily Metformin 500 mg twice daily Atorvastatin 40 mg daily Enalapril 2.5 mg twice daily Omeprazole 20 mg daily BiDil 20-37.5 mg daily Carvedilol 25 mg twice daily Eplerenone 50 mg daily Allopurinol 300 mg daily Farxiga 10 mg daily Gabapentin 300 mg 3 times daily Clonazepam 0.5-1 mg daily as needed Nifedipine 90 mg daily Ozempic 0.5 mg weekly Vitamin D2 50K  Past Surgical History Past Surgical History:  Procedure Laterality Date   COLONOSCOPY Left 04/11/2014   Procedure: COLONOSCOPY;  Surgeon: Arta Silence, MD;  Location: [facility] ENDOSCOPY;  Service: Endoscopy;  Laterality: Left;   COLONOSCOPY N/A 04/20/2014   Procedure: COLONOSCOPY;  Surgeon: Lear Ng, MD;  Location: [facility] ENDOSCOPY;  Service: Endoscopy;  Laterality: N/A;   COLONOSCOPY W/ BIOPSIES AND POLYPECTOMY     benign   DIRECT LARYNGOSCOPY  01/05/2014   DIRECT LARYNGOSCOPY N/A 01/05/2014   Procedure: DIRECT LARYNGOSCOPY AND BIOPSY;  Surgeon: [name],  MD;  Location: [place] OR;  Service: ENT;  Laterality: N/A;   KNEE ARTHROSCOPY Right ~ [place] CORONARY/GRAFT ANGIOGRAM N/A 12/31/2010   Procedure: LEFT AND RIGHT HEART CATHETERIZATION WITH Beatrix Fetters;  Surgeon: Laverda Page, MD;  Location: [facility] CATH LAB;  Service: Cardiovascular;  Laterality: N/A;   MULTIPLE EXTRACTIONS WITH ALVEOLOPLASTY N/A 07/08/2013   Procedure: Extraction of tooth #'s 22, 27 with alveoloplasty and bilateal mandibular facial exostoses reductions;  Surgeon: Lenn Cal, DDS;  Location: [facility] ORS;   Service: Oral Surgery;  Laterality: N/A;   MULTIPLE TOOTH EXTRACTIONS  ~ 2008    Social:  Lives alone at home Support: Family in the area Level of Function: Independent in IADLs and ADLs PCP: [name], MD Substances: 60-pack-year history, quit smoking 9 years ago.  Remote history of heavy drinking, sober for 35 years.  No current or prior IV drug use.  Family History:  Family History  Problem Relation Age of Onset   Cancer Mother    Stroke Mother    Hypertension Mother    Diabetes Father    Aneurysm Father    Cancer Sister        breast ca    Allergies: Allergies as of 04/13/2022 - Review Complete 04/13/2022  Allergen Reaction Noted   Azilsartan Itching 09/18/2011   Advil [ibuprofen]  09/17/2011    Review of Systems: A complete ROS was negative except as per HPI.   OBJECTIVE:   Physical Exam: Blood pressure 101/86, pulse 73, temperature 97.6 F (36.4 C), temperature source Oral, resp. rate 18, height '6\' 2"'$  (1.88 m), weight (!) 137.4 kg, SpO2 97 %.  Constitutional: Elderly male laying in bed appears slightly cold but comfortable. In no acute distress. HENT: Normocephalic, atraumatic,  Eyes: Sclera non-icteric, no conjunctival pallor, EOM intact Cardio:Regular rate and rhythm. 2+ bilateral radial and dorsalis pedis  pulses. Pulm:Clear to auscultation bilaterally. Normal work of breathing on room air. Abdomen: Soft, non-tender, non-distended, positive bowel sounds. [id number] for extremity edema. Skin:Warm and dry.  No pallor. Neuro:Alert and oriented x3. No focal deficit noted. Psych:Pleasant mood and affect.  Labs: CBC    Component Value Date/Time   WBC 2.0 (L) 04/13/2022 0931   RBC 3.63 (L) 04/13/2022 0931   HGB 10.5 (L) 04/13/2022 1026   HGB 12.3 (L) 06/30/2013 1437   HCT 31.0 (L) 04/13/2022 1026   HCT 35.4 (L) 06/30/2013 1437   PLT 7 (LL) 04/13/2022 0931   PLT 334 06/30/2013 1437   MCV 78.2 (L) 04/13/2022 0931   MCV 83.1 06/30/2013 1437   MCH  25.1 (L) 04/13/2022 0931   MCHC 32.0 04/13/2022 0931   RDW 18.1 (H) 04/13/2022 0931   RDW 16.8 (H) 06/30/2013 1437   LYMPHSABS 0.6 (L) 04/13/2022 0931   LYMPHSABS 2.3 06/30/2013 1437   MONOABS 0.1 04/13/2022 0931   MONOABS 0.8 06/30/2013 1437   EOSABS 0.0 04/13/2022 0931   EOSABS 0.4 06/30/2013 1437   BASOSABS 0.0 04/13/2022 0931   BASOSABS 0.0 06/30/2013 1437     CMP     Component Value Date/Time   NA 137 04/13/2022 1131   NA 139 04/24/2021 0912   NA 139 08/15/2013 1428   K 5.2 (H) 04/13/2022 1131   K 4.0 08/15/2013 1428   CL 104 04/13/2022 1131   CO2 26 04/13/2022 1131   CO2 29 08/15/2013 1428   GLUCOSE 138 (H) 04/13/2022 1131   GLUCOSE 155 (H) 08/15/2013 1428   BUN  13 04/13/2022 1131   BUN 14 04/24/2021 0912   BUN 14.3 08/15/2013 1428   CREATININE 1.21 04/13/2022 1131   CREATININE 1.2 08/15/2013 1428   CALCIUM 8.7 (L) 04/13/2022 1131   CALCIUM 9.8 08/15/2013 1428   PROT 6.0 (L) 04/13/2022 1131   PROT 7.1 11/13/2020 0841   PROT 7.3 06/30/2013 1438   ALBUMIN 3.5 04/13/2022 1131   ALBUMIN 4.7 11/13/2020 0841   ALBUMIN 3.9 06/30/2013 1438   AST 30 04/13/2022 1131   AST 13 06/30/2013 1438   ALT 17 04/13/2022 1131   ALT 11 06/30/2013 1438   ALKPHOS 121 04/13/2022 1131   ALKPHOS 147 06/30/2013 1438   BILITOT 0.9 04/13/2022 1131   BILITOT 0.6 11/13/2020 0841   BILITOT 0.59 06/30/2013 1438   GFRNONAA >60 04/13/2022 1131   GFRAA 104 04/10/2020 0824    Imaging: Chest x-ray without acute abnormalities. CTA of the abdomen and pelvis pending.  EKG: personally reviewed my interpretation is sinus rhythm with a short PR (54 ms) and right bundle branch block. Prior EKG from 2016 without right bundle branch block.  ASSESSMENT & PLAN:   Assessment & Plan by Problem: Active Problems:   GI bleed   Melvin Hudson is a 67 y.o. male with pertinent PMH of diverticulosis with prior bleed in 2016, hypertension, T2DM, history of squamous cell tonsillar cancer in 2015 s/p  radiation completed in 2016, HFpEF, former tobacco use disorder, and OSA who presents with hematochezia and is admitted for GI bleed.  GI bleed, most likely diverticular Presented with painless hematochezia with hemoglobin down to 9.1.  No bowel movements in the ED and received 1 unit PRBCs and platelets.  He is on aspirin at home but not any other antiplatelets or blood thinners.  GI was consulted and plan for possible scope.  He was initially hypotensive but had taken all of his antihypertensives this morning.  Pressure has been stable while getting blood and fluids with MAP above 70.  Otherwise hemodynamically stable.  CT of the abdomen and pelvis showed no active extravasation but did show severe diverticulosis with acute inflammatory changes. - Posttransfusion CBC, transfuse for hemoglobin less than 7 - Pending GI recommendations, n.p.o. at midnight for possible scope - Telemetry with close BP monitoring  Thrombocytopenia Pancytopenia Platelets here of 7000 without any history of thrombocytopenia.  White blood cell count of 2000. All cell lines down on CBC.  No abnormal bleeding other than hematochezia.  He received a unit of platelets in the ED and we will check a peripheral smear on his initial lab draw prior to platelet transfusion. - Peripheral smear - Posttransfusion CBC  Hypertension HFpEF Last echo from 03/28/2022 with LVEF 50 to 55% with mild eccentric LVH.  Patient on multiple medications for blood pressure and heart failure.  He did take all his medications this morning.  Medications include BiDil, carvedilol, enalapril, eplerenone, nifedipine, Farxiga, and atorvastatin.  Will hold his medications in the setting of hypotension secondary to acute blood loss.  No signs of heart failure exacerbation. - Hold home medications and add back as indicated  Type 2 diabetes No A1c is noted on history.  Patient takes metformin 500 mg twice daily, Farxiga 10 mg daily, and Ozempic 0.5 mg weekly at  home. - Will plan to resume home medications in the morning pending possible scope per GI  History of squamous cell tonsillar cancer s/p radiation in 2016 Patient notes that he has stopped following up with his oncologist due to stability after  radiation treatment.  No current signs of recurrence but will continue to follow-up with his primary care.  OSA CPAP  Diet: NPO VTE: SCDs IVF: None, Code: Full  Dispo: Admit patient to Inpatient with expected length of stay greater than 2 midnights.  Signed: Johny Blamer, DO Internal Medicine Resident PGY-1  04/13/2022, 6:03 PM   Dr. Johny Blamer, DO Pager [phone]

## 2022-04-13 NOTE — ED Notes (Signed)
ED TO INPATIENT HANDOFF REPORT  ED Nurse Name and Phone #: [phone]  S Name/Age/Gender Melvin Hudson 67 y.o. male Room/Bed: 032C/032C  Code Status   Code Status: Full Code  Home/SNF/Other Home Patient oriented to: self, place, time, and situation Is this baseline? Yes   Triage Complete: Triage complete  Chief Complaint GI bleed [K92.2]  Triage Note No notes on file   Allergies Allergies  Allergen Reactions   Azilsartan Itching    Avoid ARB and ACEI per MD   Advil [Ibuprofen]     Feels "jittery"    Level of Care/Admitting Diagnosis ED Disposition     ED Disposition  Admit   Condition  --   [place]: [place] [id number]  Level of Care: Progressive [102]  Admit to Progressive based on following criteria: GI, ENDOCRINE disease patients with GI bleeding, acute liver failure or pancreatitis, stable with diabetic ketoacidosis or thyrotoxicosis (hypothyroid) state.  May admit patient to [facility] or [facility] if equivalent level of care is available:: Yes  Covid Evaluation: Asymptomatic - no recent exposure (last 10 days) testing not required  Diagnosis: GI bleed [id number]  Admitting Physician: Charise Killian [id number]  Attending Physician: Charise Killian [id number]  Certification:: I certify this patient will need inpatient services for at least 2 midnights  Estimated Length of Stay: 3          B Medical/Surgery History Past Medical History:  Diagnosis Date   Angioedema 09/17/11   tongue and lips   Arthritis    "both of my legs and feet" (01/05/2014)   CHF (congestive heart failure) ([place])    Diabetes mellitus without complication ([place])    Erectile dysfunction 04/20/2018   GI bleed    Heart murmur 04/20/2018   Hx of gout    Hypertension    Obesity    OSA on CPAP    Pure hypercholesterolemia 04/20/2018   S/P radiation therapy 07/26/2013-09/15/2013   Right tonsil/bilateral neck/ 7000 cGy   Squamous cell carcinoma of right tonsil  ([place]) 06/14/13   Past Surgical History:  Procedure Laterality Date   COLONOSCOPY Left 04/11/2014   Procedure: COLONOSCOPY;  Surgeon: Arta Silence, MD;  Location: [facility] ENDOSCOPY;  Service: Endoscopy;  Laterality: Left;   COLONOSCOPY N/A 04/20/2014   Procedure: COLONOSCOPY;  Surgeon: Lear Ng, MD;  Location: [facility] ENDOSCOPY;  Service: Endoscopy;  Laterality: N/A;   COLONOSCOPY W/ BIOPSIES AND POLYPECTOMY     benign   DIRECT LARYNGOSCOPY  01/05/2014   DIRECT LARYNGOSCOPY N/A 01/05/2014   Procedure: DIRECT LARYNGOSCOPY AND BIOPSY;  Surgeon: [name], MD;  Location: [place];  Service: ENT;  Laterality: N/A;   KNEE ARTHROSCOPY Right ~ [place] CORONARY/GRAFT ANGIOGRAM N/A 12/31/2010   Procedure: LEFT AND RIGHT HEART CATHETERIZATION WITH Beatrix Fetters;  Surgeon: Laverda Page, MD;  Location: [facility] CATH LAB;  Service: Cardiovascular;  Laterality: N/A;   MULTIPLE EXTRACTIONS WITH ALVEOLOPLASTY N/A 07/08/2013   Procedure: Extraction of tooth #'s 22, 27 with alveoloplasty and bilateal mandibular facial exostoses reductions;  Surgeon: Lenn Cal, DDS;  Location: [facility] ORS;  Service: Oral Surgery;  Laterality: N/A;   MULTIPLE TOOTH EXTRACTIONS  ~ 2008     A IV Location/Drains/Wounds Patient Lines/Drains/Airways Status     Active Line/Drains/Airways     Name Placement date Placement time Site Days   Peripheral IV 04/13/22 22 G 2.5" Anterior;Left Forearm 04/13/22  1130  Forearm  less than 1  Peripheral IV 04/13/22 22 G 1.75" Anterior;Right Forearm 04/13/22  1227  Forearm  less than 1   Peripheral IV 04/13/22 20 G Right Antecubital 04/13/22  1645  Antecubital  less than 1            Intake/Output Last 24 hours  Intake/Output Summary (Last 24 hours) at 04/13/2022 1659 Last data filed at 04/13/2022 1519 Gross per 24 hour  Intake 1536 ml  Output --  Net 1536 ml    Labs/Imaging Results for orders placed or performed during the  hospital encounter of 04/13/22 (from the past 48 hour(s))  CBC with Differential     Status: Abnormal   Collection Time: 04/13/22  9:31 AM  Result Value Ref Range   WBC 2.0 (L) 4.0 - 10.5 K/uL   RBC 3.63 (L) 4.22 - 5.81 MIL/uL   Hemoglobin 9.1 (L) 13.0 - 17.0 g/dL   HCT 28.4 (L) 39.0 - 52.0 %   MCV 78.2 (L) 80.0 - 100.0 fL   MCH 25.1 (L) 26.0 - 34.0 pg   MCHC 32.0 30.0 - 36.0 g/dL   RDW 18.1 (H) 11.5 - 15.5 %   Platelets 7 (LL) 150 - 400 K/uL    Comment: Immature Platelet Fraction may be clinically indicated, consider ordering this additional test [id number] REPEATED TO VERIFY PLATELET COUNT CONFIRMED BY SMEAR THIS CRITICAL RESULT HAS VERIFIED AND BEEN CALLED TO S [place] RN BY DANIELLE LONG ON 02 25 2024 AT 1137, AND HAS BEEN READ BACK.     nRBC 3.0 (H) 0.0 - 0.2 %   Neutrophils Relative % 61 %   Neutro Abs 1.3 (L) 1.7 - 7.7 K/uL   Lymphocytes Relative 31 %   Lymphs Abs 0.6 (L) 0.7 - 4.0 K/uL   Monocytes Relative 4 %   Monocytes Absolute 0.1 0.1 - 1.0 K/uL   Eosinophils Relative 1 %   Eosinophils Absolute 0.0 0.0 - 0.5 K/uL   Basophils Relative 1 %   Basophils Absolute 0.0 0.0 - 0.1 K/uL   Immature Granulocytes 2 %   Abs Immature Granulocytes 0.03 0.00 - 0.07 K/uL    Comment: Performed at [facility] [address], [place], [place] [address]  Protime-INR     Status: Abnormal   Collection Time: 04/13/22  9:31 AM  Result Value Ref Range   Prothrombin Time 18.8 (H) 11.4 - 15.2 seconds   INR 1.6 (H) 0.8 - 1.2    Comment: (NOTE) INR goal varies based on device and disease states. Performed at [facility], [place] [address] [place], [place] [address]   APTT     Status: None   Collection Time: 04/13/22  9:31 AM  Result Value Ref Range   aPTT 28 24 - 36 seconds    Comment: Performed at [facility] [address] [place], [place] [address]  Type and screen [place]     Status: None (Preliminary result)   Collection Time: 04/13/22  10:14 AM  Result Value Ref Range   ABO/RH(D) O POS    Antibody Screen NEG    Sample Expiration 04/16/2022,2359    Unit Number [id number]    Blood Component Type RBC LR PHER2    Unit division 00    Status of Unit ISSUED    Transfusion Status OK TO TRANSFUSE    Crossmatch Result      Compatible Performed at [facility], [place] [address] [place], [place] [address]   I-Stat  Chem 8, ED     Status: Abnormal   Collection Time: 04/13/22 10:26 AM  Result Value Ref Range   Sodium 140 135 - 145 mmol/L   Potassium 4.6 3.5 - 5.1 mmol/L   Chloride 105 98 - 111 mmol/L   BUN 12 8 - 23 mg/dL   Creatinine, Ser 1.10 0.61 - 1.24 mg/dL   Glucose, Bld 146 (H) 70 - 99 mg/dL    Comment: Glucose reference range applies only to samples taken after fasting for at least 8 hours.   Calcium, Ion 1.12 (L) 1.15 - 1.40 mmol/L   TCO2 26 22 - 32 mmol/L   Hemoglobin 10.5 (L) 13.0 - 17.0 g/dL   HCT 31.0 (L) 39.0 - 52.0 %  Comprehensive metabolic panel     Status: Abnormal   Collection Time: 04/13/22 11:31 AM  Result Value Ref Range   Sodium 137 135 - 145 mmol/L   Potassium 5.2 (H) 3.5 - 5.1 mmol/L   Chloride 104 98 - 111 mmol/L   CO2 26 22 - 32 mmol/L   Glucose, Bld 138 (H) 70 - 99 mg/dL    Comment: Glucose reference range applies only to samples taken after fasting for at least 8 hours.   BUN 13 8 - 23 mg/dL   Creatinine, Ser 1.21 0.61 - 1.24 mg/dL   Calcium 8.7 (L) 8.9 - 10.3 mg/dL   Total Protein 6.0 (L) 6.5 - 8.1 g/dL   Albumin 3.5 3.5 - 5.0 g/dL   AST 30 15 - 41 U/L   ALT 17 0 - 44 U/L   Alkaline Phosphatase 121 38 - 126 U/L   Total Bilirubin 0.9 0.3 - 1.2 mg/dL   GFR, Estimated >60 >60 mL/min    Comment: (NOTE) Calculated using the CKD-EPI Creatinine Equation (2021)    Anion gap 7 5 - 15    Comment: Performed at [facility], [place] [address], [place], [place] [address]  Prepare platelet pheresis     Status: None (Preliminary result)   Collection Time: 04/13/22 11:40 AM  Result  Value Ref Range   Unit Number [id number]    Blood Component Type PLTP2 PSORALEN TREATED    Unit division 00    Status of Unit ISSUED    Transfusion Status      OK TO TRANSFUSE Performed at [facility] [address], [place], [place] [address]   Prepare RBC (crossmatch)     Status: None   Collection Time: 04/13/22  1:00 PM  Result Value Ref Range   Order Confirmation      ORDER PROCESSED BY BLOOD BANK Performed at [facility], [place] [address] [place], [place] [address]   POC occult blood, ED Provider will collect     Status: Abnormal   Collection Time: 04/13/22  1:37 PM  Result Value Ref Range   Fecal Occult Bld POSITIVE (A) NEGATIVE   DG CHEST PORT 1 VIEW  Result Date: 04/13/2022 CLINICAL DATA:  GI bleeding. EXAM: PORTABLE CHEST 1 VIEW COMPARISON:  04/13/2014 FINDINGS: The heart size and mediastinal contours are within normal limits. Both lungs are clear. IMPRESSION: No active disease. Electronically Signed   By: Marlaine Hind M.D.   On: 04/13/2022 10:16    Pending Labs Unresulted Labs (From admission, onward)     Start     Ordered   04/14/22 0500  Comprehensive metabolic panel  Tomorrow morning,   R       Question:  Specimen collection  method  Answer:  IV Team=IV Team collect   04/13/22 1608   04/14/22 0500  CBC  Tomorrow morning,   R       Question:  Specimen collection method  Answer:  IV Team=IV Team collect   04/13/22 1608   04/13/22 1621  Technologist smear review  (Technologist smear review)  Add-on,   AD       Question:  Clinical information:  Answer:  plts 7   04/13/22 1621   04/13/22 1621  Differential  (Technologist smear review)  Add-on,   AD       Question:  Specimen collection method  Answer:  IV Team=IV Team collect   04/13/22 1621   04/13/22 1604  HIV Antibody (routine testing w rflx)  (HIV Antibody (Routine testing w reflex) panel)  Once,   R       Question:  Specimen collection method  Answer:  IV Team=IV Team collect   04/13/22 1608    04/13/22 1538  Resp panel by RT-PCR (RSV, Flu A&B, Covid) Anterior Nasal Swab  (Resp panel by RT-PCR (RSV, Flu A&B, Covid))  Once,   URGENT        04/13/22 1537            Vitals/Pain Today's Vitals   04/13/22 1545 04/13/22 1615 04/13/22 1630 04/13/22 1645  BP: 93/65 102/64 114/70 101/86  Pulse: 79 68 73   Resp: (!) '28 14 18   '$ Temp:      TempSrc:      SpO2: 97% 95% 97%   Weight:      Height:      PainSc:        Isolation Precautions Airborne and Contact precautions  Medications Medications  0.9 %  sodium chloride infusion (Manually program via Guardrails IV Fluids) ( Intravenous Not Given 04/13/22 1508)  acetaminophen (TYLENOL) tablet 650 mg (has no administration in time range)    Or  acetaminophen (TYLENOL) suppository 650 mg (has no administration in time range)  lactated ringers bolus 1,000 mL (has no administration in time range)  sodium chloride 0.9 % bolus 1,000 mL (0 mLs Intravenous Stopped 04/13/22 1247)  0.9 %  sodium chloride infusion (Manually program via Guardrails IV Fluids) (0 mLs Intravenous Stopped 04/13/22 1521)    Mobility walks with device (uses a walker or cane)     Focused Assessments GI bleed, anemia   R Recommendations: See Admitting Provider Note  Report given to:   Additional Notes: Patient will be sent to the unit when bed is marked ready/clean. In the main time, please call me if you have a question.

## 2022-04-13 NOTE — ED Notes (Signed)
Provided pt BSC

## 2022-04-13 NOTE — ED Provider Notes (Signed)
[facility] Provider Note   CSN: [id number] Arrival date & time: 04/13/22  [id number]     History  No chief complaint on file.   Leeanthony Mcbane is a 67 y.o. male history includes CHF, diabetes, hypertension, gout, obesity, diverticular bleed.  On aspirin no other anticoagulants.  Patient presented via EMS today for evaluation of bright red blood in his stool that began this morning.  He reports that he was in his normal state of health yesterday and had no concerns when he went up to use the bathroom this morning and noticed a large amount of blood in the toilet.  EMS was called.  He reports since that time he has been feeling generally weak but no other complaints.  He denies chest pain, shortness of breath, fever, chills, nausea, vomiting, diarrhea or any additional concerns.  HPI     Home Medications Prior to Admission medications   Medication Sig Start Date End Date Taking? Authorizing Provider  allopurinol (ZYLOPRIM) 300 MG tablet Take 300 mg by mouth daily.    [provider]  aspirin 81 MG tablet Take 81 mg by mouth daily.     [provider]  atorvastatin (LIPITOR) 40 MG tablet Take 1 tablet (40 mg total) by mouth at bedtime. 10/07/19 04/08/22  [name], [name], DO  BIDIL 20-37.5 MG tablet TAKE 1 TABLET BY MOUTH THREE TIMES DAILY 11/03/19   [name], [name], DO  Blood Glucose Monitoring Suppl ([place]) w/Device KIT USE TO CHECK BLOOD SUGAR TWICE DAILY 04/21/19   [provider]  calcium citrate-vitamin D 500-500 MG-UNIT chewable tablet Chew 1 tablet by mouth daily. 1/2 '600mg'$  at bedtime    [provider]  carvedilol (COREG) 25 MG tablet Take 25 mg by mouth 2 (two) times daily with a meal.    [provider]  diphenhydrAMINE (BENADRYL) 25 MG tablet Take 1 tablet (25 mg total) by mouth every 6 (six) hours. 10/29/13   Dorie Rank, MD  enalapril (VASOTEC) 5 MG tablet TAKE 1 TABLET(5 MG) BY  MOUTH IN THE MORNING 12/12/21   [name], [name], DO  eplerenone (INSPRA) 50 MG tablet Take 50 mg by mouth daily. 08/29/16   [provider]  FARXIGA 10 MG TABS tablet Take 1 tablet (10 mg total) by mouth every morning. 04/08/22 07/07/22  [name], [name], DO  gabapentin (NEURONTIN) 300 MG capsule Take 300 mg by mouth 3 (three) times daily.  01/19/18   [provider]  metFORMIN (GLUCOPHAGE) 500 MG tablet Take 500 mg by mouth 2 (two) times daily with a meal.  03/29/18   [provider]  NARCAN 4 MG/0.1ML LIQD nasal spray kit SMARTSIG:Both Nares 12/29/19   [provider]  NIFEdipine (ADALAT CC) 90 MG 24 hr tablet Take 90 mg by mouth daily.    [provider]  omeprazole (PRILOSEC) 20 MG capsule Take 20 mg by mouth daily.  06/04/16   [provider]  ONETOUCH VERIO test strip USE TO [place] DAILY 07/20/19   [provider]  oxyCODONE-acetaminophen (PERCOCET) 10-325 MG per tablet Take 1 tablet by mouth every 6 (six) hours as needed for pain. Patient taking differently: Take 1 tablet by mouth 4 (four) times daily. 11/11/13   Eppie Gibson, MD  OZEMPIC, 0.25 OR 0.5 MG/DOSE, 2 MG/1.5ML SOPN SMARTSIG:0.25 Milligram(s) SUB-Q Once a Week 12/29/19   [provider]  Vitamin D, Ergocalciferol, (DRISDOL) 1.25 MG (50000 UT) CAPS capsule Take 50,000  Units by mouth. Every Friday.    [provider]      Allergies    Azilsartan and Advil [ibuprofen]    Review of Systems   Review of Systems Ten systems are reviewed and are negative for acute change except as noted in the HPI  Physical Exam Updated Vital Signs BP 103/77   Pulse 91   Temp (!) 97.5 F (36.4 C) (Oral)   Resp 14   Ht '6\' 2"'$  (1.88 m)   Wt (!) 137.4 kg   SpO2 100%   BMI 38.90 kg/m  Physical Exam Constitutional:      General: He is not in acute distress.    Appearance: Normal appearance. He is well-developed. He is not ill-appearing or diaphoretic.  HENT:      Head: Normocephalic and atraumatic.  Eyes:     General: Vision grossly intact. Gaze aligned appropriately.     Pupils: Pupils are equal, round, and reactive to light.  Neck:     Trachea: Trachea and phonation normal.  Cardiovascular:     Rate and Rhythm: Normal rate and regular rhythm.  Pulmonary:     Effort: Pulmonary effort is normal. No respiratory distress.     Breath sounds: Normal breath sounds.  Abdominal:     General: There is no distension.     Palpations: Abdomen is soft.     Tenderness: There is no abdominal tenderness. There is no guarding or rebound.  Genitourinary:    Comments: Rectal examination chaperoned by Tresa Moore.  No external hemorrhoids or source of bleeding.  Normal rectal tone.  No palpable internal hemorrhoids.  Scant amount bright red blood on exam. Musculoskeletal:        General: Normal range of motion.     Cervical back: Normal range of motion.  Skin:    General: Skin is warm and dry.  Neurological:     Mental Status: He is alert.     GCS: GCS eye subscore is 4. GCS verbal subscore is 5. GCS motor subscore is 6.     Comments: Speech is clear and goal oriented, follows commands Major Cranial nerves without deficit, no facial droop Moves extremities without ataxia, coordination intact  Psychiatric:        Behavior: Behavior normal.     ED Results / Procedures / Treatments   Labs (all labs ordered are listed, but only abnormal results are displayed) Labs Reviewed  COMPREHENSIVE METABOLIC PANEL  CBC WITH DIFFERENTIAL/PLATELET  PROTIME-INR  APTT  I-STAT CHEM 8, ED  POC OCCULT BLOOD, ED  TYPE AND SCREEN    EKG None  Radiology No results found.  Procedures Ultrasound ED Peripheral IV (Provider)  Date/Time: 04/13/2022 10:48 AM  Performed by: Deliah Boston, PA-C Authorized by: Deliah Boston, PA-C   Procedure details:    Indications: hypotension, multiple failed IV attempts and poor IV access     Skin Prep: chlorhexidine  gluconate     Location:  Right AC   Angiocath:  18 G   Bedside Ultrasound Guided: Yes     Patient tolerated procedure without complications: Yes     Dressing applied: Yes   Comments:     Risk benefits discussed w/ pt. Attempted [place] VI Right AC. Unable to flush. [place] RN attempting. .Critical Care  Performed by: Deliah Boston, PA-C Authorized by: Deliah Boston, PA-C   Critical care provider statement:    Critical care time (minutes):  45   Critical care was necessary to  treat or prevent imminent or life-threatening deterioration of the following conditions:  Circulatory failure   Critical care was time spent personally by me on the following activities:  Development of treatment plan with patient or surrogate, discussions with consultants, evaluation of patient's response to treatment, examination of patient, ordering and review of laboratory studies, ordering and review of radiographic studies, ordering and performing treatments and interventions, pulse oximetry, re-evaluation of patient's condition, review of old charts, blood draw for specimens and obtaining history from patient or surrogate   Care discussed with comment:  Attending physician and gastroenterology     Medications Ordered in ED Medications - No data to display  ED Course/ Medical Decision Making/ A&P                              Medical Decision Making 68 year old male history as above presented for hematochezia onset this morning.  Normal state of health yesterday.  Reports some generalized weakness since onset of symptoms.  He has on aspirin denies anticoagulants.  He denies any associated chest pain or shortness of breath, no abdominal pain, no additional concerns.  No tachycardia on arrival, blood pressure borderline low systolics low [id number].  Will obtain labs along with type and screen.  Patient reports similar episode several years ago which required multiple transfusions after GI bleed.  Patient is agreeable  to blood products today if needed.  Amount and/or Complexity of Data Reviewed External Data Reviewed: notes.    Details: Reviewed medical record patient's most recent colonoscopy was on 04/30/2014 performed by Dr. Michail Sermon, indication was hematochezia.  Patient was found to have diffuse left-sided diverticulosis, medium size internal hemorrhoids and no active bleeding.  Most recent echo 03/28/2022 showed EF of 50-55%. Labs: ordered.    Details: CBC shows significant thrombocytopenia of 7.  Anemia of 9.1 and leukopenia of 2.0. INR elevated at 1.6.  aPTT within normal limits. Type and screen O+. CMP shows mild hyperkalemia 5.2.  No emergent electrolyte derangement, AKI, LFT elevations or gap. Hemoccult positive Radiology: ordered.    Details: I personally reviewed and interpreted patient's 1 view chest x-ray I do not appreciate any obvious PTX, PNA, effusion or other acute cardiopulmonary process. ECG/medicine tests:     Details: I have personally reviewed and interpreted patient's twelve-lead EKG.  I do not appreciate any obvious acute ischemic changes.  Shows normal sinus rhythm rate 75. Discussion of management or test interpretation with external provider(s):   3:44 PM: Consult with [facility] who has accepted patient for admission.  Risk Prescription drug management. Decision regarding hospitalization. Risk Details: Patient found to be significantly thrombocytopenic today, after risk versus benefits were discussed and consent obtained patient agreed to receive fresh frozen platelets.  Patient's blood pressure was borderline throughout visit occasionally low into the 80s, he received 1 L IV fluids, additional fluids were avoided given history of heart failure; blood products were given for suspected hypotension and GI bleed.  Critical Care Total time providing critical care: 45 minutes  Care handoff given to Theodis Blaze PA-C at shift change.  Patient has been admitted to the IM TS.  We are  still waiting callback from GI, question for them would be if they feel a CTA of the abdomen pelvis is indicated or if they would see patient first.  If they do recommend CT the IV team will need to be called back to place a larger bore IV.  Patient is stable  at this time, mentating well and in no acute distress.  He states understanding of care plan and is agreeable for admission.  Note: Portions of this report may have been transcribed using voice recognition software. Every effort was made to ensure accuracy; however, inadvertent computerized transcription errors may still be present.         Final Clinical Impression(s) / ED Diagnoses Final diagnoses:  None    Rx / DC Orders ED Discharge Orders     None         Gari Crown 04/13/22 1601    Isla Pence, MD 04/17/22 1610

## 2022-04-13 NOTE — ED Notes (Signed)
IV team order placed for second line.  Pt BP still trending low after 1000 ml Bolus. (84/58)  Pt currently denies any pain and AOx4

## 2022-04-13 NOTE — ED Provider Notes (Signed)
Was given follow-up by previous provider in ED to contact GI. Spoke with Dr. Therisa Doyne, with [facility] who will evaluate the patient. She wanted to ensure that hematology/oncology was being contacted given his pancytopenia that is new. I have contacted the admitted team regarding her concerns.    Mickie Hillier, PA-C 04/13/22 1657    Tegeler, Gwenyth Allegra, MD 04/13/22 [phone]

## 2022-04-13 NOTE — Hospital Course (Addendum)
Melvin Hudson is a 67 y.o. 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     GI bleed, most likely diverticular Presented with painless hematochezia with hemoglobin down to 9.1.  No bowel movements in the ED and received 1 unit PRBCs and platelets.  He is on aspirin at home but not any other antiplatelets or blood thinners.  GI was consulted in ED.  He was initially hypotensive but had taken all of his antihypertensives that morning.  Pressure has been stable while getting blood and fluids with MAP above 70.  Otherwise hemodynamically stable.  CT of the abdomen and pelvis showed no active extravasation but did show severe diverticulosis with acute inflammatory changes. Bleeding most likely resolved just before presentation to the ED.  He did not had any more bloody bowel movements but did have a normal bowel movement here.  GI have seen the patient again and do not plan for colonoscopy since he is not bleeding and stable. Posttransfusion hemoglobin of 10.0 and hemoglobin of 8.6 the following morning and stable the next day at 8.4. We held his antihypertensives during admission with plans to restart them at PCP follow up as tolerated. Advised to not resume aspirin. He will need a colonoscopy in 2026.   Iron deficiency anemia Thrombocytopenia Pancytopenia Platelets here of 7000 without any history of thrombocytopenia. White blood cell count of 2000. All cell lines down on CBC. No abnormal bleeding other than hematochezia. He received a unit of platelets in the ED. These abnormalities resolved on repeat draw with platelets around 250,000 and white count up to normal.  Peripheral smear did show ovalocytes but normal platelet and white blood cell morphology.  LDH also  within normal limits.  B12 and folate within normal limits.  Iron 27 with saturation of 9 and ferritin level of 10.  Iron deficit of 1500, 1 unit of blood is 200 mg of iron. IV iron 250 mg twice while admitted and discharged on ferrous sulfate 325 mg 3 times weekly.    Hypertension HFpEF Last echo from 03/28/2022 with LVEF 50 to 55% with mild eccentric LVH.  Patient on multiple medications for blood pressure and heart failure. Medications include BiDil, carvedilol, enalapril, eplerenone, nifedipine, Farxiga, and atorvastatin.  Held antihypertensive agents as above. Resumed Farxiga and atorvastatin during admission.   Type 2 diabetes A1c 4.9 here.  Patient takes metformin 500 mg twice daily, Farxiga 10 mg daily, and Ozempic 0.5 mg weekly at home. Resumed metformin 500 mg twice daily and Farxiga 10 mg daily.  We recommended that he discuss his medications with his primary care due to his low A1c.   History of squamous cell tonsillar cancer s/p radiation in 2016 Patient notes that he has stopped following up with his oncologist due to stability after radiation treatment.  No current signs of recurrence but will continue to follow-up with his primary care.   OSA CPAP while admitted.   Anxiety Patient takes clonazepam 1 mg 3 times daily as needed at home and we continued this during admission.

## 2022-04-13 NOTE — ED Notes (Signed)
Pt states he has a hx of GI bleeds. He says last time he had to get a rapid blood infusion through his neck and he felt extremely weak.  Pt states now he just feel tired and not as weak as before.

## 2022-04-13 NOTE — Progress Notes (Signed)
Report and care received for PT. Pt is assessed vitals retaken and plan of care discussed with PT. Pt given urinal to void. BP is currently 105/68 HR 72 resp even and unlabored. No acute distress noted. Personal items within reach and call bell is with PT. Call bell active for fall precautions.

## 2022-04-13 NOTE — ED Notes (Signed)
Patient transported to CT 

## 2022-04-14 DIAGNOSIS — K5731 Diverticulosis of large intestine without perforation or abscess with bleeding: Secondary | ICD-10-CM | POA: Diagnosis not present

## 2022-04-14 DIAGNOSIS — K922 Gastrointestinal hemorrhage, unspecified: Secondary | ICD-10-CM

## 2022-04-14 DIAGNOSIS — D61818 Other pancytopenia: Secondary | ICD-10-CM

## 2022-04-14 DIAGNOSIS — D696 Thrombocytopenia, unspecified: Secondary | ICD-10-CM | POA: Diagnosis not present

## 2022-04-14 DIAGNOSIS — D509 Iron deficiency anemia, unspecified: Secondary | ICD-10-CM | POA: Diagnosis not present

## 2022-04-14 LAB — COMPREHENSIVE METABOLIC PANEL
ALT: 13 U/L (ref 0–44)
AST: 15 U/L (ref 15–41)
Albumin: 3 g/dL — ABNORMAL LOW (ref 3.5–5.0)
Alkaline Phosphatase: 98 U/L (ref 38–126)
Anion gap: 9 (ref 5–15)
BUN: 13 mg/dL (ref 8–23)
CO2: 24 mmol/L (ref 22–32)
Calcium: 8.4 mg/dL — ABNORMAL LOW (ref 8.9–10.3)
Chloride: 104 mmol/L (ref 98–111)
Creatinine, Ser: 0.99 mg/dL (ref 0.61–1.24)
GFR, Estimated: >60 mL/min (ref >60)
Glucose, Bld: 89 mg/dL (ref 70–99)
Potassium: 4 mmol/L (ref 3.5–5.1)
Sodium: 137 mmol/L (ref 135–145)
Total Bilirubin: 1 mg/dL (ref 0.3–1.2)
Total Protein: 5.1 g/dL — ABNORMAL LOW (ref 6.5–8.1)

## 2022-04-14 LAB — CBC
HCT: 25.3 % — ABNORMAL LOW (ref 39.0–52.0)
Hemoglobin: 8.6 g/dL — ABNORMAL LOW (ref 13.0–17.0)
MCH: 26 pg (ref 26.0–34.0)
MCHC: 34 g/dL (ref 30.0–36.0)
MCV: 76.4 fL — ABNORMAL LOW (ref 80.0–100.0)
Platelets: 241 10*3/uL (ref 150–400)
RBC: 3.31 MIL/uL — ABNORMAL LOW (ref 4.22–5.81)
RDW: 18.1 % — ABNORMAL HIGH (ref 11.5–15.5)
WBC: 9.9 10*3/uL (ref 4.0–10.5)
nRBC: 0.2 % (ref 0.0–0.2)

## 2022-04-14 LAB — FERRITIN: Ferritin: 10 ng/mL — ABNORMAL LOW (ref 24–336)

## 2022-04-14 LAB — BPAM PLATELET PHERESIS
Blood Product Expiration Date: 202402272359
ISSUE DATE / TIME: 202402251235
Unit Type and Rh: 6200

## 2022-04-14 LAB — PREPARE PLATELET PHERESIS: Unit division: 0

## 2022-04-14 LAB — IRON AND TIBC
Iron: 27 ug/dL — ABNORMAL LOW (ref 45–182)
Saturation Ratios: 9 % — ABNORMAL LOW (ref 17.9–39.5)
TIBC: 304 ug/dL (ref 250–450)
UIBC: 277 ug/dL

## 2022-04-14 LAB — GLUCOSE, CAPILLARY: Glucose-Capillary: 95 mg/dL (ref 70–99)

## 2022-04-14 MED ORDER — METFORMIN HCL 500 MG PO TABS: 500.0000 mg | ORAL_TABLET | Freq: Two times a day (BID) | ORAL | Status: DC

## 2022-04-14 MED ORDER — CLONAZEPAM 0.25 MG PO TBDP: 1.0000 mg | ORAL_TABLET | Freq: Three times a day (TID) | ORAL | Status: DC | PRN

## 2022-04-14 MED ORDER — PSYLLIUM 95 % PO PACK
1.0000 | PACK | Freq: Every day | ORAL | Status: DC
  Administered 2022-04-15: 1 via ORAL
  Filled 2022-04-14 (×2): qty 1

## 2022-04-14 MED ORDER — ATORVASTATIN CALCIUM 40 MG PO TABS
40.0000 mg | ORAL_TABLET | Freq: Every day | ORAL | Status: DC
  Administered 2022-04-14: 40 mg via ORAL
  Filled 2022-04-14: qty 1

## 2022-04-14 MED ORDER — SODIUM CHLORIDE 0.9 % IV SOLN
250.0000 mg | INTRAVENOUS | Status: DC
  Filled 2022-04-14: qty 20

## 2022-04-14 MED ORDER — SODIUM CHLORIDE 0.9 % IV SOLN
250.0000 mg | Freq: Every day | INTRAVENOUS | Status: DC
  Administered 2022-04-14: 250 mg via INTRAVENOUS
  Filled 2022-04-14 (×2): qty 20

## 2022-04-14 MED ORDER — GABAPENTIN 300 MG PO CAPS
300.0000 mg | ORAL_CAPSULE | Freq: Three times a day (TID) | ORAL | Status: DC
  Administered 2022-04-14 – 2022-04-15 (×2): 300 mg via ORAL
  Filled 2022-04-14 (×2): qty 1

## 2022-04-14 MED ORDER — PANTOPRAZOLE SODIUM 40 MG PO TBEC
40.0000 mg | DELAYED_RELEASE_TABLET | Freq: Every day | ORAL | Status: DC
  Administered 2022-04-14 – 2022-04-15 (×2): 40 mg via ORAL
  Filled 2022-04-14 (×2): qty 1

## 2022-04-14 MED ORDER — ALLOPURINOL 300 MG PO TABS
300.0000 mg | ORAL_TABLET | Freq: Every day | ORAL | Status: DC
  Administered 2022-04-15: 300 mg via ORAL
  Filled 2022-04-14: qty 1

## 2022-04-14 MED ORDER — DAPAGLIFLOZIN PROPANEDIOL 10 MG PO TABS
10.0000 mg | ORAL_TABLET | Freq: Every morning | ORAL | Status: DC
  Administered 2022-04-15: 10 mg via ORAL
  Filled 2022-04-14: qty 1

## 2022-04-14 NOTE — Evaluation (Signed)
Physical Therapy Evaluation/ Discharge Patient Details Name: Melvin Hudson MRN: [id number] DOB: 09-25-1955 Today's Date: 04/14/2022  History of Present Illness  67 yo male admitted 2/25 with GIB, CT angio (-). PMHx: diverticulosis, DM, CHF, HTN, gout, obesity  Clinical Impression  Pt pleasant, lives home alone and uses cane at times. Pt reports his sister comes over on the weekends to help with cleaning at times. Pt drives and performs all ADLs/IADLs without hx of fall. Pt able to perform transfers and gait without LOB or fatigue and demonstrates baseline functional status. No futher acute therapy needs with pt aware and agreeable. Will sign off.        Recommendations for follow up therapy are one component of a multi-disciplinary discharge planning process, led by the attending physician.  Recommendations may be updated based on patient status, additional functional criteria and insurance authorization.  Follow Up Recommendations No PT follow up      Assistance Recommended at Discharge PRN  Patient can return home with the following       Equipment Recommendations None recommended by PT  Recommendations for Other Services       Functional Status Assessment Patient has not had a recent decline in their functional status     Precautions / Restrictions Precautions Precautions: None      Mobility  Bed Mobility               General bed mobility comments: EOB on arrival and in chair end of session    Transfers Overall transfer level: Modified independent                      Ambulation/Gait Ambulation/Gait assistance: Modified independent (Device/Increase time) Gait Distance (Feet): 600 Feet Assistive device: None, Straight cane Gait Pattern/deviations: WFL(Within Functional Limits)       General Gait Details: pt with good stride and stability with and without use of cane. pt reports baseline speed and gait without hx of falls  Stairs             Wheelchair Mobility    Modified Rankin (Stroke Patients Only)       Balance Overall balance assessment: No apparent balance deficits (not formally assessed)                                           Pertinent Vitals/Pain Pain Assessment Pain Assessment: No/denies pain    Home Living Family/patient expects to be discharged to:: Private residence Living Arrangements: Alone Available Help at Discharge: Family;Available PRN/intermittently Type of Home: Apartment Home Access: Level entry       Home Layout: One level Home Equipment: Cane - single point      Prior Function Prior Level of Function : Independent/Modified Independent;Driving                     Hand Dominance        Extremity/Trunk Assessment   Upper Extremity Assessment Upper Extremity Assessment: Overall WFL for tasks assessed    Lower Extremity Assessment Lower Extremity Assessment: Overall WFL for tasks assessed    Cervical / Trunk Assessment Cervical / Trunk Assessment: Normal  Communication   Communication: No difficulties  Cognition Arousal/Alertness: Awake/alert Behavior During Therapy: WFL for tasks assessed/performed Overall Cognitive Status: Within Functional Limits for tasks assessed  General Comments      Exercises     Assessment/Plan    PT Assessment Patient does not need any further PT services  PT Problem List         PT Treatment Interventions      PT Goals (Current goals can be found in the Care Plan section)  Acute Rehab PT Goals PT Goal Formulation: All assessment and education complete, DC therapy    Frequency       Co-evaluation               AM-PAC PT "6 Clicks" Mobility  Outcome Measure Help needed turning from your back to your side while in a flat bed without using bedrails?: None Help needed moving from lying on your back to sitting on the side of a flat bed  without using bedrails?: None Help needed moving to and from a bed to a chair (including a wheelchair)?: None Help needed standing up from a chair using your arms (e.g., wheelchair or bedside chair)?: None Help needed to walk in hospital room?: None Help needed climbing 3-5 steps with a railing? : A Little 6 Click Score: 23    End of Session   Activity Tolerance: Patient tolerated treatment well Patient left: in chair;with call bell/phone within reach;with nursing/sitter in room Nurse Communication: Mobility status PT Visit Diagnosis: Other abnormalities of gait and mobility (R26.89)    Time: [id number] PT Time Calculation (min) (ACUTE ONLY): 16 min   Charges:   PT Evaluation $PT Eval Low Complexity: 1 Low          Kyani Simkin P, PT Acute Rehabilitation Services Office: [phone]   Lamarr Lulas 04/14/2022, 12:43 PM

## 2022-04-14 NOTE — Progress Notes (Signed)
HD#1 Subjective:   Summary: Melvin Hudson is a 67 y.o. 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  Overnight Events: None  Patient is doing well this morning without any bloody bowel movements.  He did have 1 normal bowel movement overnight.  He denies any new abdominal pain.  He is eating well without any nausea or vomiting.  Denies any symptoms of orthostatic here or at home.  Objective:  Vital signs in last 24 hours: Vitals:   04/13/22 1931 04/13/22 2300 04/14/22 0334 04/14/22 0400  BP: 1'00/74 94/61 99/61 '$ 99/61  Pulse: 67 70 81 81  Resp: '16 18 11 14  '$ Temp: 97.9 F (36.6 C) 98.1 F (36.7 C) 98.3 F (36.8 C) 98.3 F (36.8 C)  TempSrc: Oral Oral Oral Oral  SpO2: 93% 93% 99% 99%  Weight:      Height:       Supplemental O2: Room Air SpO2: 99 % FiO2 (%): 21 %   Physical Exam:  Constitutional: Elderly male sitting in bed. In no acute distress. Cardio:Regular rate and rhythm Pulm:Clear to auscultation bilaterally. Normal work of breathing on room air. Abdomen: Soft, non-tender, non-distended, positive bowel sounds. [id number] for extremity edema. Skin:Warm and dry.  No pallor. Neuro:Alert and oriented x3. No focal deficit noted.  Filed Weights   04/13/22 0956  Weight: (!) 137.4 kg      Intake/Output Summary (Last 24 hours) at 04/14/2022 0615 Last data filed at 04/14/2022 0334 Gross per 24 hour  Intake 1846 ml  Output 1375 ml  Net 471 ml   Net IO Since Admission: 471 mL [04/14/22 0615]  Pertinent Labs:    Latest Ref Rng & Units 04/14/2022    1:11 AM 04/13/2022    7:48 PM 04/13/2022   10:26 AM  CBC  WBC 4.0 - 10.5 K/uL 9.9  10.6    Hemoglobin 13.0 - 17.0 g/dL 8.6  10.0  10.5   Hematocrit 39.0 - 52.0 % 25.3  29.2   31.0   Platelets 150 - 400 K/uL 241  259         Latest Ref Rng & Units 04/14/2022    1:11 AM 04/13/2022    9:02 PM 04/13/2022   11:31 AM  CMP  Glucose 70 - 99 mg/dL 89  90  138   BUN 8 - 23 mg/dL '13  13  13   '$ Creatinine 0.61 - 1.24 mg/dL 0.99  0.99  1.21   Sodium 135 - 145 mmol/L 137  139  137   Potassium 3.5 - 5.1 mmol/L 4.0  4.4  5.2   Chloride 98 - 111 mmol/L 104  106  104   CO2 22 - 32 mmol/L '24  25  26   '$ Calcium 8.9 - 10.3 mg/dL 8.4  8.8  8.7   Total Protein 6.5 - 8.1 g/dL 5.1   6.0   Total Bilirubin 0.3 - 1.2 mg/dL 1.0   0.9   Alkaline Phos 38 - 126 U/L 98   121   AST 15 - 41 U/L 15   30   ALT 0 - 44 U/L 13   17     Assessment/Plan:   Active Problems:   GI bleed   Patient Summary: Melvin Hudson is a 67 y.o. 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  He has not had any more bloody bowel movements and has only had 1 bowel movement here.  GI have seen the patient again and do not plan for colonoscopy since he is not bleeding and stable.  Posttransfusion hemoglobin of 10.0 and hemoglobin of 8.6 this morning.  Blood pressures remain borderline low today but patient notes that his blood pressure usually runs this low at home.  He denies any orthostatic symptoms at home.  We are still holding his antihypertensives. - Full liquid diet this morning and advance as tolerated - DC aspirin and confirm with PCP follow-up - Start Metamucil and continue at home - Will need colonoscopy in 2026   Iron deficiency anemia Thrombocytopenia Pancytopenia These abnormalities resolved on repeat draw with platelets around  250,000 and white count up to normal.  Peripheral smear did show ovalocytes but normal platelet and white blood cell morphology.  LDH also within normal limits.  B12 and folate within normal limits.  Iron 27 with saturation of 9 and ferritin level of 10.  Iron deficit of 1500, 1 unit of blood is 200 mg of iron. - IV iron 250 mg once today and tomorrow, plan on oral iron replacement outpatient   Hypertension HFpEF Last echo from 03/28/2022 with LVEF 50 to 55% with mild eccentric LVH.  Patient on multiple medications for blood pressure and heart failure.  He did take all his medications this morning.  Medications include BiDil, carvedilol, enalapril, eplerenone, nifedipine, Farxiga, and atorvastatin.  We will continue to hold his medications and add them back as his pressure tolerates. - Hold home medications and add back as indicated   Type 2 diabetes No A1c is noted on history.  Patient takes metformin 500 mg twice daily, Farxiga 10 mg daily, and Ozempic 0.5 mg weekly at home. - Resume metformin 500 mg twice daily and Farxiga 10 mg daily   History of squamous cell tonsillar cancer s/p radiation in 2016 Patient notes that he has stopped following up with his oncologist due to stability after radiation treatment.  No current signs of recurrence but will continue to follow-up with his primary care.   OSA CPAP  Anxiety Patient takes clonazepam 1 mg 3 times daily as needed at home and will resume this.  Diet:  ADAT to regular IVF: None, VTE: None Code: Full PT/OT recs: None, .   Dispo: Anticipated discharge to Home in 1 days pending stability.   Johny Blamer, DO Internal Medicine Resident PGY-1 Pager: [phone]  Please contact the on call pager after 5 pm and on weekends at [phone].

## 2022-04-14 NOTE — Consult Note (Signed)
[facility] Consult  Referring Provider: Rectal bleeding Primary Care Physician:  [name], MD Primary Gastroenterologist: Dr. Michail Sermon  Reason for Consultation: Hematochezia  HPI: Melvin Hudson is a 67 y.o. male with past medical history of left-sided diverticulosis presented to the ER with several episodes of painless bright red blood per rectum.  Patient states that at midnight yesterday he woke up with the sensation to have a bowel movement and noticed passage of large amount of bright red blood per rectum.  He continued to have several of those episodes and presented to the ED for further evaluation. Since coming to the ER he has not had any further rectal bleeding. Patient denies abdominal or rectal pain. He takes a baby aspirin at home, otherwise is not on any other antiplatelets or NSAIDs or anticoagulation. Patient's last colonoscopy was in 2016, on 04/11/2014 he was found to have active bleeding from left-sided diverticulosis. Colonoscopy from 04/20/2014 showed left-sided diverticulosis, internal hemorrhoids, otherwise the prep was good, rest of the colon and terminal ileum appeared unremarkable. Patient reports about 40 pound weight loss in the last 1 year which she believes is related to taking Ozempic for diabetes. He denies early satiety, nausea, vomiting, acid reflux, difficulty swallowing or pain on swallowing. Since 2016, few months ago he had 1 episode of rectal bleeding and none since then except yesterday. There is no family history of colon cancer.   Past Medical History:  Diagnosis Date   Angioedema 09/17/11   tongue and lips   Arthritis    "both of my legs and feet" (01/05/2014)   CHF (congestive heart failure) (HCC)    Diabetes mellitus without complication ([place])    Erectile dysfunction 04/20/2018   GI bleed    Heart murmur 04/20/2018   Hx of gout    Hypertension    Obesity    OSA on CPAP    Pure hypercholesterolemia 04/20/2018   S/P radiation therapy  07/26/2013-09/15/2013   Right tonsil/bilateral neck/ 7000 cGy   Squamous cell carcinoma of right tonsil ([place]) 06/14/13    Past Surgical History:  Procedure Laterality Date   COLONOSCOPY Left 04/11/2014   Procedure: COLONOSCOPY;  Surgeon: Arta Silence, MD;  Location: [facility];  Service: Endoscopy;  Laterality: Left;   COLONOSCOPY N/A 04/20/2014   Procedure: COLONOSCOPY;  Surgeon: Lear Ng, MD;  Location: [facility] ENDOSCOPY;  Service: Endoscopy;  Laterality: N/A;   COLONOSCOPY W/ BIOPSIES AND POLYPECTOMY     benign   DIRECT LARYNGOSCOPY  01/05/2014   DIRECT LARYNGOSCOPY N/A 01/05/2014   Procedure: DIRECT LARYNGOSCOPY AND BIOPSY;  Surgeon: [name], MD;  Location: [facility];  Service: ENT;  Laterality: N/A;   KNEE ARTHROSCOPY Right ~ [place] CORONARY/GRAFT ANGIOGRAM N/A 12/31/2010   Procedure: LEFT AND RIGHT HEART CATHETERIZATION WITH Beatrix Fetters;  Surgeon: Laverda Page, MD;  Location: [facility] CATH LAB;  Service: Cardiovascular;  Laterality: N/A;   MULTIPLE EXTRACTIONS WITH ALVEOLOPLASTY N/A 07/08/2013   Procedure: Extraction of tooth #'s 22, 27 with alveoloplasty and bilateal mandibular facial exostoses reductions;  Surgeon: Lenn Cal, DDS;  Location: [facility]S;  Service: Oral Surgery;  Laterality: N/A;   MULTIPLE TOOTH EXTRACTIONS  ~ 2008    Prior to Admission medications   Medication Sig Start Date End Date Taking? Authorizing Provider  allopurinol (ZYLOPRIM) 300 MG tablet Take 300 mg by mouth daily.    [provider]  aspirin 81 MG tablet Take 81 mg by mouth daily.  [provider]  atorvastatin (LIPITOR) 40 MG tablet Take 1 tablet (40 mg total) by mouth at bedtime. 10/07/19 04/08/22  [name], [name], DO  BIDIL 20-37.5 MG tablet TAKE 1 TABLET BY MOUTH THREE TIMES DAILY 11/03/19   [name], [name], DO  Blood Glucose Monitoring Suppl (Triana) w/Device KIT USE TO CHECK BLOOD SUGAR TWICE DAILY  04/21/19   [provider]  calcium citrate-vitamin D 500-500 MG-UNIT chewable tablet Chew 1 tablet by mouth daily. 1/2 '600mg'$  at bedtime    [provider]  carvedilol (COREG) 25 MG tablet Take 25 mg by mouth 2 (two) times daily with a meal.    [provider]  diphenhydrAMINE (BENADRYL) 25 MG tablet Take 1 tablet (25 mg total) by mouth every 6 (six) hours. 10/29/13   Dorie Rank, MD  enalapril (VASOTEC) 5 MG tablet TAKE 1 TABLET(5 MG) BY MOUTH IN THE MORNING 12/12/21   [name], [name], DO  eplerenone (INSPRA) 50 MG tablet Take 50 mg by mouth daily. 08/29/16   [provider]  FARXIGA 10 MG TABS tablet Take 1 tablet (10 mg total) by mouth every morning. 04/08/22 07/07/22  [name], [name], DO  gabapentin (NEURONTIN) 300 MG capsule Take 300 mg by mouth 3 (three) times daily.  01/19/18   [provider]  metFORMIN (GLUCOPHAGE) 500 MG tablet Take 500 mg by mouth 2 (two) times daily with a meal.  03/29/18   [provider]  NARCAN 4 MG/0.1ML LIQD nasal spray kit SMARTSIG:Both Nares 12/29/19   [provider]  NIFEdipine (ADALAT CC) 90 MG 24 hr tablet Take 90 mg by mouth daily.    [provider]  omeprazole (PRILOSEC) 20 MG capsule Take 20 mg by mouth daily.  06/04/16   [provider]  ONETOUCH VERIO test strip USE TO [place] DAILY 07/20/19   [provider]  oxyCODONE-acetaminophen (PERCOCET) 10-325 MG per tablet Take 1 tablet by mouth every 6 (six) hours as needed for pain. Patient taking differently: Take 1 tablet by mouth 4 (four) times daily. 11/11/13   Eppie Gibson, MD  OZEMPIC, 0.25 OR 0.5 MG/DOSE, 2 MG/1.5ML SOPN SMARTSIG:0.25 Milligram(s) SUB-Q Once a Week 12/29/19   [provider]  Vitamin D, Ergocalciferol, (DRISDOL) 1.25 MG (50000 UT) CAPS capsule Take 50,000 Units by mouth. Every Friday.    [provider]    Current Facility-Administered Medications  Medication Dose Route Frequency  Provider Last Rate Last Admin   0.9 %  sodium chloride infusion (Manually program via Guardrails IV Fluids)   Intravenous Once Nuala Alpha A, PA-C       acetaminophen (TYLENOL) tablet 650 mg  650 mg Oral Q6H PRN Delene Ruffini, MD       Or   acetaminophen (TYLENOL) suppository 650 mg  650 mg Rectal Q6H PRN Delene Ruffini, MD        Allergies as of 04/13/2022 - Review Complete 04/13/2022  Allergen Reaction Noted   Azilsartan Itching 09/18/2011   Advil [ibuprofen]  09/17/2011    Family History  Problem Relation Age of Onset   Cancer Mother    Stroke Mother    Hypertension Mother    Diabetes Father    Aneurysm Father    Cancer Sister        breast ca    Social History   Socioeconomic History   Marital status: Widowed    Spouse name: Not on file   Number of children: 0   Years of education:  Not on file   Highest education level: Not on file  Occupational History   Not on file  Tobacco Use   Smoking status: Former    Packs/day: 2.00    Years: 42.00    Total pack years: 84.00    Types: Cigarettes, Cigars    Quit date: 06/17/2013    Years since quitting: 8.8   Smokeless tobacco: Never  Vaping Use   Vaping Use: Never used  Substance and Sexual Activity   Alcohol use: Never   Drug use: No   Sexual activity: Not Currently  Other Topics Concern   Not on file  Social History Narrative   Not on file   Social Determinants of Health   Financial Resource Strain: Not on file  Food Insecurity: No Food Insecurity (04/13/2022)   Hunger Vital Sign    Worried About Running Out of Food in the Last Year: Never true    Ran Out of Food in the Last Year: Never true  Transportation Needs: No Transportation Needs (04/13/2022)   PRAPARE - Hydrologist (Medical): No    Lack of Transportation (Non-Medical): No  Physical Activity: Not on file  Stress: Not on file  Social Connections: Not on file  Intimate Partner Violence: Not At Risk (04/13/2022)    Humiliation, Afraid, Rape, and Kick questionnaire    Fear of Current or Ex-Partner: No    Emotionally Abused: No    Physically Abused: No    Sexually Abused: No    Review of Systems: As per HPI  Physical Exam: Vital signs in last 24 hours: Temp:  [97.5 F (36.4 C)-98.3 F (36.8 C)] 98.1 F (36.7 C) (02/26 0736) Pulse Rate:  [67-100] 80 (02/26 0736) Resp:  [11-44] 17 (02/26 0736) BP: (83-119)/(56-103) 99/80 (02/26 0736) SpO2:  [91 %-100 %] 100 % (02/26 0736) FiO2 (%):  [21 %] 21 % (02/25 2231) Weight:  [137.4 kg] 137.4 kg (02/25 0956) Last BM Date : 04/13/22  General:   Alert,  Well-developed, overweight, pleasant and cooperative in NAD Head:  Normocephalic and atraumatic. Eyes:  Sclera clear, no icterus.  Mild pallor  ears:  Normal auditory acuity. Nose:  No deformity, discharge,  or lesions. Mouth:  No deformity or lesions.  Oropharynx pink & moist. Neck:  Supple; no masses or thyromegaly. Lungs:  Clear throughout to auscultation.   No wheezes, crackles, or rhonchi. No acute distress. Heart:  Regular rate and rhythm; no murmurs, clicks, rubs,  or gallops. Extremities:  Without clubbing or edema. Neurologic:  Alert and  oriented x4;  grossly normal neurologically. Skin:  Intact without significant lesions or rashes. Psych:  Alert and cooperative. Normal mood and affect. Abdomen:  Soft, nontender and nondistended. No masses, hepatosplenomegaly or hernias noted. Normal bowel sounds, without guarding, and without rebound.         Lab Results: Recent Labs    04/13/22 0931 04/13/22 1026 04/13/22 1948 04/14/22 0111  WBC 2.0*  --  10.6* 9.9  HGB 9.1* 10.5* 10.0* 8.6*  HCT 28.4* 31.0* 29.2* 25.3*  PLT 7*  --  259 241   BMET Recent Labs    04/13/22 1131 04/13/22 2102 04/14/22 0111  NA 137 139 137  K 5.2* 4.4 4.0  CL 104 106 104  CO2 '26 25 24  '$ GLUCOSE 138* 90 89  BUN '13 13 13  '$ CREATININE 1.21 0.99 0.99  CALCIUM 8.7* 8.8* 8.4*   LFT Recent Labs     04/14/22 0111  PROT 5.1*  ALBUMIN 3.0*  AST 15  ALT 13  ALKPHOS 98  BILITOT 1.0   PT/INR Recent Labs    04/13/22 0931  LABPROT 18.8*  INR 1.6*    Studies/Results: CT Angio Abd/Pel W and/or Wo Contrast  Result Date: 04/13/2022 CLINICAL DATA:  Lower gastrointestinal hemorrhage EXAM: CTA ABDOMEN AND PELVIS WITHOUT AND WITH CONTRAST TECHNIQUE: Multidetector CT imaging of the abdomen and pelvis was performed using the standard protocol during bolus administration of intravenous contrast. Multiplanar reconstructed images and MIPs were obtained and reviewed to evaluate the vascular anatomy. RADIATION DOSE REDUCTION: This exam was performed according to the departmental dose-optimization program which includes automated exposure control, adjustment of the mA and/or kV according to patient size and/or use of iterative reconstruction technique. CONTRAST:  22m OMNIPAQUE IOHEXOL 350 MG/ML SOLN COMPARISON:  PET CT 01/19/2014 FINDINGS: VASCULAR Aorta: Normal caliber aorta without aneurysm, dissection, vasculitis or significant stenosis. 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 Distally widely patent. SMA: Patent without evidence of aneurysm, dissection, vasculitis or significant stenosis. Renals: Dual renal arteries bilaterally. No evidence of hemodynamically significant stenosis. Normal vascular morphology. No aneurysm or dissection. IMA: Patent without evidence of aneurysm, dissection, vasculitis or significant stenosis. Inflow: Patent without evidence of aneurysm, dissection, vasculitis or significant stenosis. Proximal Outflow: Bilateral common femoral and visualized portions of the superficial and profunda femoral arteries are patent without evidence of aneurysm, dissection, vasculitis or significant stenosis. Veins: Unremarkable Review of the MIP images confirms the above findings. NON-VASCULAR Lower chest: No  acute abnormality. Hepatobiliary: No focal liver abnormality is seen. No gallstones, gallbladder wall thickening, or biliary dilatation. Pancreas: Unremarkable Spleen: Unremarkable Adrenals/Urinary Tract: Adrenal glands are unremarkable. Kidneys are normal, without renal calculi, focal lesion, or hydronephrosis. Bladder is unremarkable. Stomach/Bowel: No active gastrointestinal hemorrhage identified. Severe descending and sigmoid colonic diverticulosis without superimposed acute inflammatory change. The stomach, small bowel, and large bowel are otherwise unremarkable. Appendix normal. No free intraperitoneal gas or fluid. Lymphatic: No pathologic adenopathy within the abdomen and pelvis. Reproductive: Prostate is unremarkable. Other: No abdominal wall hernia or abnormality. No abdominopelvic ascites. Musculoskeletal: Trabecular coarsening and cortical thickening involving the left ilium, L2, and L5 vertebral bodies suggests Paget's disease of the bone. No acute bone abnormality. No focal lytic or blastic bone lesion. IMPRESSION: 1. No active gastrointestinal hemorrhage identified. 2. 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 3. Severe distal colonic diverticulosis without superimposed acute inflammatory change. 4. 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 Aortic Atherosclerosis (ICD10-I70.0). Electronically Signed   By: AFidela SalisburyM.D.   On: 04/13/2022 18:35   DG CHEST PORT 1 VIEW  Result Date: 04/13/2022 CLINICAL DATA:  GI bleeding. EXAM: PORTABLE CHEST 1 VIEW COMPARISON:  04/13/2014 FINDINGS: The heart size and mediastinal contours are within normal limits. Both lungs are clear. IMPRESSION: No active disease. Electronically Signed   By: JMarlaine HindM.D.   On: 04/13/2022 10:16    Impression: Painless hematochezia CT angio negative for gastrointestinal hemorrhage Severe distal  colonic diverticulosis without inflammation noted Coincidentally, patient noted to have high-grade, more than 75% stenosis of celiac axis(however patient denies postprandial abdominal pain or unintentional weight loss except that associated with Ozempic). Hemoglobin 9.1 on admission, repeat labs showed 10.5/10 and 8.6 today Surprisingly platelet count was 7 on admission, however has normalized at  259 and 241. Patient received 1 unit PRBC and 1 unit of platelet.  Labs compatible with iron deficiency, saturation 9%, ferritin 10.  Plan: Painless hematochezia likely diverticular in origin, seems to have resolved. Recommend advancing diet to full liquid, if tolerated and no further rectal bleeding, okay to resume regular diet and evening and discharged home today evening or tomorrow morning. Recommend holding aspirin for 5 days. Recommend starting Metamucil while in the hospital and taking fiber supplement such as Metamucil/Benefiber/psyllium husk on a regular basis. Ideally patient is due for colonoscopy in 2026 for screening purposes. Please recall GI if needed.   LOS: 1 day   Ronnette Juniper, MD  04/14/2022, 9:19 AM

## 2022-04-15 ENCOUNTER — Inpatient Hospital Stay (HOSPITAL_COMMUNITY)
Admission: EM | Admit: 2022-04-15 | Discharge: 2022-04-18 | DRG: 378 | Disposition: A | Discharge status: Home / Self Care | Payer: Medicare HMO | Attending: Internal Medicine | Admitting: Internal Medicine

## 2022-04-15 ENCOUNTER — Telehealth: Payer: Self-pay

## 2022-04-15 ENCOUNTER — Emergency Department (HOSPITAL_COMMUNITY): Payer: Medicare HMO

## 2022-04-15 ENCOUNTER — Other Ambulatory Visit (HOSPITAL_COMMUNITY): Payer: Self-pay

## 2022-04-15 DIAGNOSIS — Z823 Family history of stroke: Secondary | ICD-10-CM

## 2022-04-15 DIAGNOSIS — Z79899 Other long term (current) drug therapy: Secondary | ICD-10-CM

## 2022-04-15 DIAGNOSIS — I951 Orthostatic hypotension: Secondary | ICD-10-CM | POA: Diagnosis present

## 2022-04-15 DIAGNOSIS — D500 Iron deficiency anemia secondary to blood loss (chronic): Secondary | ICD-10-CM | POA: Diagnosis not present

## 2022-04-15 DIAGNOSIS — I110 Hypertensive heart disease with heart failure: Secondary | ICD-10-CM | POA: Diagnosis present

## 2022-04-15 DIAGNOSIS — Z85818 Personal history of malignant neoplasm of other sites of lip, oral cavity, and pharynx: Secondary | ICD-10-CM

## 2022-04-15 DIAGNOSIS — G4733 Obstructive sleep apnea (adult) (pediatric): Secondary | ICD-10-CM | POA: Diagnosis present

## 2022-04-15 DIAGNOSIS — R55 Syncope and collapse: Secondary | ICD-10-CM | POA: Insufficient documentation

## 2022-04-15 DIAGNOSIS — I5032 Chronic diastolic (congestive) heart failure: Secondary | ICD-10-CM | POA: Insufficient documentation

## 2022-04-15 DIAGNOSIS — Z7951 Long term (current) use of inhaled steroids: Secondary | ICD-10-CM

## 2022-04-15 DIAGNOSIS — K5731 Diverticulosis of large intestine without perforation or abscess with bleeding: Principal | ICD-10-CM | POA: Diagnosis present

## 2022-04-15 DIAGNOSIS — K922 Gastrointestinal hemorrhage, unspecified: Secondary | ICD-10-CM | POA: Diagnosis not present

## 2022-04-15 DIAGNOSIS — E119 Type 2 diabetes mellitus without complications: Secondary | ICD-10-CM | POA: Diagnosis present

## 2022-04-15 DIAGNOSIS — Z6838 Body mass index (BMI) 38.0-38.9, adult: Secondary | ICD-10-CM

## 2022-04-15 DIAGNOSIS — M109 Gout, unspecified: Secondary | ICD-10-CM | POA: Diagnosis present

## 2022-04-15 DIAGNOSIS — Z87891 Personal history of nicotine dependence: Secondary | ICD-10-CM

## 2022-04-15 DIAGNOSIS — Z923 Personal history of irradiation: Secondary | ICD-10-CM

## 2022-04-15 DIAGNOSIS — Z7984 Long term (current) use of oral hypoglycemic drugs: Secondary | ICD-10-CM

## 2022-04-15 DIAGNOSIS — Z888 Allergy status to other drugs, medicaments and biological substances status: Secondary | ICD-10-CM

## 2022-04-15 DIAGNOSIS — R9431 Abnormal electrocardiogram [ECG] [EKG]: Secondary | ICD-10-CM | POA: Insufficient documentation

## 2022-04-15 DIAGNOSIS — Z8249 Family history of ischemic heart disease and other diseases of the circulatory system: Secondary | ICD-10-CM

## 2022-04-15 DIAGNOSIS — M8888 Osteitis deformans of other bones: Secondary | ICD-10-CM | POA: Diagnosis present

## 2022-04-15 DIAGNOSIS — G894 Chronic pain syndrome: Secondary | ICD-10-CM | POA: Diagnosis present

## 2022-04-15 DIAGNOSIS — I10 Essential (primary) hypertension: Secondary | ICD-10-CM | POA: Diagnosis present

## 2022-04-15 DIAGNOSIS — N179 Acute kidney failure, unspecified: Secondary | ICD-10-CM | POA: Insufficient documentation

## 2022-04-15 DIAGNOSIS — E669 Obesity, unspecified: Secondary | ICD-10-CM | POA: Diagnosis present

## 2022-04-15 DIAGNOSIS — Z833 Family history of diabetes mellitus: Secondary | ICD-10-CM

## 2022-04-15 DIAGNOSIS — F411 Generalized anxiety disorder: Secondary | ICD-10-CM | POA: Insufficient documentation

## 2022-04-15 LAB — CBC
HCT: 25.3 % — ABNORMAL LOW (ref 39.0–52.0)
Hemoglobin: 8.4 g/dL — ABNORMAL LOW (ref 13.0–17.0)
MCH: 25.6 pg — ABNORMAL LOW (ref 26.0–34.0)
MCHC: 33.2 g/dL (ref 30.0–36.0)
MCV: 77.1 fL — ABNORMAL LOW (ref 80.0–100.0)
Platelets: 229 10*3/uL (ref 150–400)
RBC: 3.28 MIL/uL — ABNORMAL LOW (ref 4.22–5.81)
RDW: 18.2 % — ABNORMAL HIGH (ref 11.5–15.5)
WBC: 8.6 10*3/uL (ref 4.0–10.5)
nRBC: 0.3 % — ABNORMAL HIGH (ref 0.0–0.2)

## 2022-04-15 LAB — BASIC METABOLIC PANEL
Anion gap: 10 (ref 5–15)
BUN: 11 mg/dL (ref 8–23)
CO2: 24 mmol/L (ref 22–32)
Calcium: 8.4 mg/dL — ABNORMAL LOW (ref 8.9–10.3)
Chloride: 105 mmol/L (ref 98–111)
Creatinine, Ser: 1.31 mg/dL — ABNORMAL HIGH (ref 0.61–1.24)
GFR, Estimated: 59 mL/min — ABNORMAL LOW (ref >60)
Glucose, Bld: 180 mg/dL — ABNORMAL HIGH (ref 70–99)
Potassium: 3.7 mmol/L (ref 3.5–5.1)
Sodium: 139 mmol/L (ref 135–145)

## 2022-04-15 LAB — CBC WITH DIFFERENTIAL/PLATELET
Abs Immature Granulocytes: 0.1 10*3/uL — ABNORMAL HIGH (ref 0.00–0.07)
Basophils Absolute: 0 10*3/uL (ref 0.0–0.1)
Basophils Relative: 1 %
Eosinophils Absolute: 0.4 10*3/uL (ref 0.0–0.5)
Eosinophils Relative: 4 %
HCT: 26.3 % — ABNORMAL LOW (ref 39.0–52.0)
Hemoglobin: 8.2 g/dL — ABNORMAL LOW (ref 13.0–17.0)
Immature Granulocytes: 1 %
Lymphocytes Relative: 16 %
Lymphs Abs: 1.3 10*3/uL (ref 0.7–4.0)
MCH: 24.7 pg — ABNORMAL LOW (ref 26.0–34.0)
MCHC: 31.2 g/dL (ref 30.0–36.0)
MCV: 79.2 fL — ABNORMAL LOW (ref 80.0–100.0)
Monocytes Absolute: 1 10*3/uL (ref 0.1–1.0)
Monocytes Relative: 11 %
Neutro Abs: 5.7 10*3/uL (ref 1.7–7.7)
Neutrophils Relative %: 67 %
Platelets: 284 10*3/uL (ref 150–400)
RBC: 3.32 MIL/uL — ABNORMAL LOW (ref 4.22–5.81)
RDW: 18.5 % — ABNORMAL HIGH (ref 11.5–15.5)
WBC: 8.5 10*3/uL (ref 4.0–10.5)
nRBC: 2.1 % — ABNORMAL HIGH (ref 0.0–0.2)

## 2022-04-15 LAB — I-STAT CHEM 8, ED
BUN: 13 mg/dL (ref 8–23)
Calcium, Ion: 0.89 mmol/L — CL (ref 1.15–1.40)
Chloride: 107 mmol/L (ref 98–111)
Creatinine, Ser: 1.3 mg/dL — ABNORMAL HIGH (ref 0.61–1.24)
Glucose, Bld: 172 mg/dL — ABNORMAL HIGH (ref 70–99)
HCT: 23 % — ABNORMAL LOW (ref 39.0–52.0)
Hemoglobin: 7.8 g/dL — ABNORMAL LOW (ref 13.0–17.0)
Potassium: 3.7 mmol/L (ref 3.5–5.1)
Sodium: 138 mmol/L (ref 135–145)
TCO2: 23 mmol/L (ref 22–32)

## 2022-04-15 LAB — HEMOGLOBIN A1C
Hgb A1c MFr Bld: 4.9 % (ref 4.8–5.6)
Mean Plasma Glucose: 94 mg/dL

## 2022-04-15 LAB — TYPE AND SCREEN
ABO/RH(D): O POS
Antibody Screen: NEGATIVE
Unit division: 0

## 2022-04-15 LAB — PREPARE RBC (CROSSMATCH)

## 2022-04-15 LAB — BPAM RBC
Blood Product Expiration Date: 202403272359
ISSUE DATE / TIME: 202402251448
Unit Type and Rh: 5100

## 2022-04-15 MED ORDER — OXYCODONE-ACETAMINOPHEN 10-325 MG PO TABS: 1.0000 | ORAL_TABLET | Freq: Four times a day (QID) | ORAL | Status: DC | PRN

## 2022-04-15 MED ORDER — ATORVASTATIN CALCIUM 40 MG PO TABS
40.0000 mg | ORAL_TABLET | Freq: Every day | ORAL | Status: DC
  Administered 2022-04-16 – 2022-04-17 (×2): 40 mg via ORAL
  Filled 2022-04-15 (×2): qty 1

## 2022-04-15 MED ORDER — CLONAZEPAM 0.5 MG PO TBDP: 1.0000 mg | ORAL_TABLET | Freq: Three times a day (TID) | ORAL | Status: DC | PRN

## 2022-04-15 MED ORDER — IOHEXOL 350 MG/ML SOLN
100.0000 mL | Freq: Once | INTRAVENOUS | Status: AC | PRN
  Administered 2022-04-15: 100 mL via INTRAVENOUS

## 2022-04-15 MED ORDER — ACETAMINOPHEN 650 MG RE SUPP: 650.0000 mg | Freq: Four times a day (QID) | RECTAL | Status: DC | PRN

## 2022-04-15 MED ORDER — SODIUM CHLORIDE 0.9 % IV BOLUS
1000.0000 mL | Freq: Once | INTRAVENOUS | Status: AC
  Administered 2022-04-15: 1000 mL via INTRAVENOUS

## 2022-04-15 MED ORDER — SODIUM CHLORIDE 0.9% IV SOLUTION: Freq: Once | INTRAVENOUS | Status: DC

## 2022-04-15 MED ORDER — ALLOPURINOL 300 MG PO TABS
300.0000 mg | ORAL_TABLET | Freq: Every day | ORAL | Status: DC
  Administered 2022-04-16 – 2022-04-18 (×3): 300 mg via ORAL
  Filled 2022-04-15: qty 3
  Filled 2022-04-15 (×2): qty 1

## 2022-04-15 MED ORDER — LACTATED RINGERS IV BOLUS
1000.0000 mL | Freq: Once | INTRAVENOUS | Status: AC
  Administered 2022-04-16: 1000 mL via INTRAVENOUS

## 2022-04-15 MED ORDER — GABAPENTIN 300 MG PO CAPS
300.0000 mg | ORAL_CAPSULE | Freq: Three times a day (TID) | ORAL | Status: DC
  Administered 2022-04-16 – 2022-04-18 (×7): 300 mg via ORAL
  Filled 2022-04-15 (×8): qty 1

## 2022-04-15 MED ORDER — OXYCODONE HCL 5 MG PO TABS
5.0000 mg | ORAL_TABLET | Freq: Four times a day (QID) | ORAL | Status: DC | PRN
  Administered 2022-04-16 – 2022-04-18 (×4): 5 mg via ORAL
  Filled 2022-04-15 (×4): qty 1

## 2022-04-15 MED ORDER — PSYLLIUM 95 % PO PACK
1.0000 | PACK | Freq: Every day | ORAL | Status: DC
  Administered 2022-04-16 – 2022-04-18 (×3): 1 via ORAL
  Filled 2022-04-15 (×3): qty 1

## 2022-04-15 MED ORDER — OXYCODONE-ACETAMINOPHEN 5-325 MG PO TABS
1.0000 | ORAL_TABLET | Freq: Four times a day (QID) | ORAL | Status: DC | PRN
  Administered 2022-04-16 – 2022-04-18 (×3): 1 via ORAL
  Filled 2022-04-15 (×3): qty 1

## 2022-04-15 MED ORDER — FERROUS SULFATE 325 (65 FE) MG PO TABS
325.0000 mg | ORAL_TABLET | ORAL | 1 refills | Status: DC
  Filled 2022-04-15: qty 60, 140d supply, fill #0

## 2022-04-15 MED ORDER — PANTOPRAZOLE SODIUM 40 MG PO TBEC
40.0000 mg | DELAYED_RELEASE_TABLET | Freq: Every day | ORAL | Status: DC
  Administered 2022-04-16 – 2022-04-18 (×3): 40 mg via ORAL
  Filled 2022-04-15 (×3): qty 1

## 2022-04-15 MED ORDER — PSYLLIUM 95 % PO PACK
1.0000 | PACK | Freq: Every day | ORAL | 0 refills | Status: AC
  Filled 2022-04-15: qty 240, 240d supply, fill #0

## 2022-04-15 MED ORDER — SODIUM CHLORIDE 0.9 % IV SOLN
250.0000 mg | Freq: Every day | INTRAVENOUS | Status: AC
  Administered 2022-04-15: 250 mg via INTRAVENOUS
  Filled 2022-04-15: qty 20

## 2022-04-15 MED ORDER — ACETAMINOPHEN 325 MG PO TABS: 650.0000 mg | ORAL_TABLET | Freq: Four times a day (QID) | ORAL | Status: DC | PRN

## 2022-04-15 NOTE — Discharge Instructions (Addendum)
Melvin Hudson,  You were recently admitted to [facility] for a lower gastrointestinal bleed most likely from a diverticulum that fortunately resolved on its own.   Continue taking your home medications with the following changes  Start taking Ferrous sulfate (iron pill) 325 mg every Monday, Wednesday, and Friday Metamucil daily Stop taking Aspirin Do not resume until you talk to your primary care doctor BiDil (isosorbide dinitrate/hydralazine) Carvedilol Enalapril Eplerenone Nifedipine   You should seek further medical care if you notice any more bloody bowel movements, have any abdominal pain, feels severely fatigued or weak, or any other bothersome symptoms.  We recommend that you see your primary care doctor in about a week to make sure that you continue to improve. We are so glad that you are feeling better.  Sincerely, Johny Blamer, DO

## 2022-04-15 NOTE — TOC Transition Note (Signed)
Transition of Care (TOC) - CM/SW Discharge Note Marvetta Gibbons RN, BSN Transitions of Care Unit 4E- RN Case Manager See Treatment Team for direct phone #   Patient Details  Name: Melvin Hudson MRN: [id number] Date of Birth: 1955-09-07  Transition of Care [facility]) CM/SW Contact:  Dawayne Patricia, RN Phone Number: 04/15/2022, 12:44 PM   Clinical Narrative:    Pt stable for transition home today, patient from home, Transition of Care Department [facility]) has reviewed patient and no TOC needs have been identified at this time. Pt has transportation home and will follow up as per AVS instructions.    Final next level of care: Home/Self Care Barriers to Discharge: No Barriers Identified   Patient Goals and CMS Choice CMS Medicare.gov Compare Post Acute Care list provided to:: Patient Choice offered to / list presented to : NA  Discharge Placement                 Home        Discharge Plan and Services Additional resources added to the After Visit Summary for       Post Acute Care Choice: NA                               Social Determinants of Health (SDOH) Interventions SDOH Screenings   Food Insecurity: No Food Insecurity (04/13/2022)  Housing: Low Risk  (04/13/2022)  Transportation Needs: No Transportation Needs (04/13/2022)  Utilities: Not At Risk (04/13/2022)  Tobacco Use: Medium Risk (04/13/2022)     Readmission Risk Interventions     No data to display

## 2022-04-15 NOTE — Telephone Encounter (Signed)
Thank you Gladys. I agree with him returning to ED. He had pretty significant bleeding. He had been doing well for two days and we discharged him earlier this morning. Unfortunate that this recurred so quickly.

## 2022-04-15 NOTE — ED Provider Notes (Addendum)
[place] EMERGENCY DEPARTMENT AT [facility] Provider Note   CSN: [id number] Arrival date & time: 04/15/22  1546     History  Chief Complaint  Patient presents with   GI Bleeding    Melvin Hudson is a 67 y.o. male with past medical history significant for CHF, hypertension, obesity presents to the ED via EMS from home with complaint of GI bleeding.  Patient was just discharged this morning and when at home, had a huge bloody bowel movement with loose stool.  While awaiting room assignment, EMS reports patient went unresponsive briefly in the hallway.  Patient states he felt very sweaty and like he just wanted to "take a nap" before his syncopal episode.  Denies abdominal pain, nausea, vomiting, headache, lightheadedness, dizziness.  He does not take blood thinners.      Home Medications Prior to Admission medications   Medication Sig Start Date End Date Taking? Authorizing Provider  allopurinol (ZYLOPRIM) 300 MG tablet Take 300 mg by mouth daily.    [provider]  atorvastatin (LIPITOR) 40 MG tablet Take 1 tablet (40 mg total) by mouth at bedtime. 10/07/19 04/14/22  [name], [name], DO  Blood Glucose Monitoring Suppl (ONETOUCH VERIO FLEX SYSTEM) w/Device KIT USE TO CHECK BLOOD SUGAR TWICE DAILY 04/21/19   [provider]  calcium citrate-vitamin D 500-500 MG-UNIT chewable tablet Chew 0.5-1 tablets by mouth daily. Take 1 tablet by mouth in the morning and a half tablet at bedtime.    [provider]  clonazePAM (KLONOPIN) 1 MG disintegrating tablet Take 1 mg by mouth 3 (three) times daily as needed (Anxiety). 03/10/22   [provider]  diphenhydrAMINE (BENADRYL) 25 MG tablet Take 1 tablet (25 mg total) by mouth every 6 (six) hours. Patient taking differently: Take 25 mg by mouth every 6 (six) hours as needed for allergies or sleep. 10/29/13   Linwood Dibbles, MD  FARXIGA 10 MG TABS tablet Take 1 tablet (10 mg total) by mouth every morning. 04/08/22  07/07/22  Tessa Lerner, DO  ferrous sulfate 325 (65 FE) MG tablet Take 1 tablet (325 mg total) by mouth every Monday, Wednesday, and Friday. 04/16/22 04/16/23  Rocky Morel, DO  gabapentin (NEURONTIN) 300 MG capsule Take 300 mg by mouth 3 (three) times daily.  01/19/18   [provider]  metFORMIN (GLUCOPHAGE) 500 MG tablet Take 500 mg by mouth 2 (two) times daily with a meal.  03/29/18   [provider]  NARCAN 4 MG/0.1ML LIQD nasal spray kit Place 1 spray into the nose once as needed (Overdose). 12/29/19   [provider]  omeprazole (PRILOSEC) 20 MG capsule Take 20 mg by mouth daily.  06/04/16   [provider]  ONETOUCH VERIO test strip USE TO CHECK BLOOD SUGAR TWICE DAILY 07/20/19   [provider]  oxyCODONE-acetaminophen (PERCOCET) 10-325 MG per tablet Take 1 tablet by mouth every 6 (six) hours as needed for pain. Patient taking differently: Take 1 tablet by mouth 4 (four) times daily as needed for pain. 11/11/13   Lonie Peak, MD  OZEMPIC, 0.25 OR 0.5 MG/DOSE, 2 MG/1.5ML SOPN Inject 0.25 mg into the skin once a week. Sunday's 12/29/19   [provider]  psyllium (HYDROCIL/METAMUCIL) 95 % PACK Take 1 packet by mouth daily. 04/15/22   Rocky Morel, DO  Vitamin D, Ergocalciferol, (DRISDOL) 1.25 MG (50000 UT) CAPS capsule Take 50,000 Units by mouth. Every Friday.    [provider]      Allergies  Azilsartan and Advil [ibuprofen]    Review of Systems   Review of Systems  Physical Exam Updated Vital Signs BP 108/73 (BP Location: Right Arm)   Pulse 82   Temp 98 F (36.7 C) (Oral)   Resp 18   SpO2 99%  Physical Exam Vitals and nursing note reviewed.  Constitutional:      General: He is not in acute distress.    Appearance: He is ill-appearing. He is not toxic-appearing or diaphoretic.  HENT:     Mouth/Throat:     Mouth: Mucous membranes are moist.     Pharynx: Oropharynx is clear.  Cardiovascular:     Rate and  Rhythm: Regular rhythm. Tachycardia present.     Pulses: Normal pulses.     Heart sounds: Normal heart sounds.     Comments: Mild tachycardia, HR in the low 100s Pulmonary:     Effort: Pulmonary effort is normal. No respiratory distress.     Breath sounds: Normal breath sounds and air entry.  Abdominal:     General: Abdomen is flat. Bowel sounds are normal. There is no distension.     Palpations: Abdomen is soft.     Tenderness: There is no abdominal tenderness.  Skin:    General: Skin is warm and dry.     Capillary Refill: Capillary refill takes less than 2 seconds.  Neurological:     Mental Status: He is alert. Mental status is at baseline.  Psychiatric:        Mood and Affect: Mood normal.        Behavior: Behavior normal.     ED Results / Procedures / Treatments   Labs (all labs ordered are listed, but only abnormal results are displayed) Labs Reviewed  BASIC METABOLIC PANEL - Abnormal; Notable for the following components:      Result Value   Glucose, Bld 180 (*)    Creatinine, Ser 1.31 (*)    Calcium 8.4 (*)    GFR, Estimated 59 (*)    All other components within normal limits  CBC WITH DIFFERENTIAL/PLATELET - Abnormal; Notable for the following components:   RBC 3.32 (*)    Hemoglobin 8.2 (*)    HCT 26.3 (*)    MCV 79.2 (*)    MCH 24.7 (*)    RDW 18.5 (*)    nRBC 2.1 (*)    Abs Immature Granulocytes 0.10 (*)    All other components within normal limits  I-STAT CHEM 8, ED - Abnormal; Notable for the following components:   Creatinine, Ser 1.30 (*)    Glucose, Bld 172 (*)    Calcium, Ion 0.89 (*)    Hemoglobin 7.8 (*)    HCT 23.0 (*)    All other components within normal limits  TYPE AND SCREEN  PREPARE RBC (CROSSMATCH)    EKG None  Radiology CT Angio Abd/Pel W and/or Wo Contrast  Result Date: 04/15/2022 CLINICAL DATA:  Lower gastrointestinal hemorrhage EXAM: CTA ABDOMEN AND PELVIS WITHOUT AND WITH CONTRAST TECHNIQUE: Multidetector CT imaging of the  abdomen and pelvis was performed using the standard protocol during bolus administration of intravenous contrast. Multiplanar reconstructed images and MIPs were obtained and reviewed to evaluate the vascular anatomy. RADIATION DOSE REDUCTION: This exam was performed according to the departmental dose-optimization program which includes automated exposure control, adjustment of the mA and/or kV according to patient size and/or use of iterative reconstruction technique. CONTRAST:  OMNIPAQUE IOHEXOL 350 MG/ML SOLN COMPARISON:  None Available. FINDINGS: VASCULAR Aorta:  Normal caliber aorta without aneurysm, dissection, vasculitis or significant stenosis. 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 Distally widely patent. SMA: Patent without evidence of aneurysm, dissection, vasculitis or significant stenosis. Renals: Both renal arteries are patent without evidence of aneurysm, dissection, vasculitis, fibromuscular dysplasia or significant stenosis. IMA: Patent without evidence of aneurysm, dissection, vasculitis or significant stenosis. Inflow: Patent without evidence of aneurysm, dissection, vasculitis or significant stenosis. Mild atherosclerotic calcification. Internal iliac arteries are patent bilaterally. Proximal Outflow: Bilateral common femoral and visualized portions of the superficial and profunda femoral arteries are patent without evidence of aneurysm, dissection, vasculitis or significant stenosis. Veins: Unremarkable Review of the MIP images confirms the above findings. NON-VASCULAR Lower chest: No acute abnormality. Hepatobiliary: No focal liver abnormality is seen. No gallstones, gallbladder wall thickening, or biliary dilatation. Pancreas: Unremarkable Spleen: Unremarkable Adrenals/Urinary Tract: Adrenal glands are unremarkable. Kidneys are normal, without renal calculi, focal lesion,  or hydronephrosis. Bladder is unremarkable. Stomach/Bowel: Severe descending and sigmoid colonic diverticulosis without superimposed acute inflammatory change. Stomach, small bowel, and large bowel are otherwise unremarkable. Appendix normal. No free intraperitoneal gas or fluid. No evidence of active gastrointestinal hemorrhage. Lymphatic: No pathologic adenopathy within the abdomen and pelvis. Reproductive: Prostate is unremarkable. Other: No abdominal wall hernia or abnormality. No abdominopelvic ascites. Musculoskeletal: No acute bone abnormality. 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 IMPRESSION: 1. No evidence of active gastrointestinal hemorrhage. 2. 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 This can be seen in the setting of median arcuate ligament syndrome. 3. Severe distal colonic diverticulosis without superimposed acute inflammatory change. 4. 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 Aortic Atherosclerosis (ICD10-I70.0). Electronically Signed   By: Helyn Numbers M.D.   On: 04/15/2022 19:01    Procedures .Critical Care  Performed by: Lenard Simmer, PA Authorized by: Lenard Simmer, PA   Critical care provider statement:    Critical care time (minutes):  42   Critical care was necessary to treat or prevent imminent or life-threatening deterioration of the following conditions:  Shock   Critical care was time spent personally by me on the following activities:  Development of treatment plan with patient or surrogate, discussions with consultants, evaluation of patient's response to treatment, examination of patient, obtaining history from patient or surrogate, ordering and performing treatments and interventions, ordering and review of laboratory studies, ordering and review of radiographic studies, pulse oximetry,  re-evaluation of patient's condition and review of old charts   Care discussed with: admitting provider       Medications Ordered in ED Medications  0.9 %  sodium chloride infusion (Manually program via Guardrails IV Fluids) (has no administration in time range)  0.9 %  sodium chloride infusion (Manually program via Guardrails IV Fluids) (has no administration in time range)  sodium chloride 0.9 % bolus 1,000 mL (0 mLs Intravenous Stopped 04/15/22 1900)  sodium chloride 0.9 % bolus 1,000 mL (0 mLs Intravenous Stopped 04/15/22 1900)  iohexol (OMNIPAQUE) 350 MG/ML injection 100 mL (100 mLs Intravenous Contrast Given 04/15/22 1839)    ED Course/ Medical Decision Making/ A&P Clinical Course as of 04/15/22 1952  Tue Apr 15, 2022  1919 CT Angio Abd/Pel W and/or Wo Contrast [MC]    Clinical Course User Index [MC] Lenard Simmer, [place]  Medical Decision Making Amount and/or Complexity of Data Reviewed Labs: ordered. Radiology: ordered. Decision-making details documented in ED Course.  Risk Prescription drug management. Decision regarding hospitalization.   This patient presents to the ED with chief complaint(s) of GI bleed (bright red blood per rectum) with pertinent past medical history of GI bleed, HTN, HLD, obesity, CHF.  The complaint involves an extensive differential diagnosis and also carries with it a high risk of complications and morbidity.    The differential diagnosis includes lower GI bleed, diverticulosis, diverticulitis, colorectal cancer    The initial plan is to transfuse emergent blood, give IVF, and obtain baseline labs  Additional history obtained: Additional history obtained from EMS  who report patient is hypotensive and had syncopal episode while awaiting bed assignment  Records reviewed previous admission documents; patient was seen by internal medicine and GI with no active bleeding at discharge from hospital earlier today  Initial  Assessment:   Exam significant for an ill-appearing patient who is not in acute distress.  Sheet under patient is covered in a large amount of bright red blood.  Abdomen is soft and non tender to palpation.  Normal bowel sounds.  Heart rate is mildly tachycardic with regular rhythm.  Lungs are clear to auscultation bilaterally.  Skin is warm and dry with good color.    Independent ECG/labs interpretation:  The following labs were independently interpreted:  I-state chem 8 reveals worsening anemia, elevated Cr compared to yesterday, and critically low ionized calcium.  CBC confirms worsening anemia when compared to earlier values from today.    Independent visualization and interpretation of imaging: I independently visualized the following imaging with scope of interpretation limited to determining acute life threatening conditions related to emergency care: CTA Abd/Pel, which revealed no evidence of active gastrointestinal hemorrhage.  I agree with radiologist interpretation.    Treatment and Reassessment: Patient was treated with IVF boluses and blood with improvement in blood pressure.  Patient has not had any further episodes of syncope and reports feeling better.  Patient is not actively bleeding at time of reassessment.  There is a large blood clot on the pad underneath patient.    Disposition:   I requested consultation with on-call [facility] provider, spoke with Dr. Marca Ancona, who agrees to see patient.  I requested consultation with internal medicine to have patient admitted to hospital.  Spoke with IM resident, patient will be admitted to hospital with GI following for lower GI bleed.         Final Clinical Impression(s) / ED Diagnoses Final diagnoses:  Lower GI bleed  Syncope, unspecified syncope type  Blood loss anemia    Rx / DC Orders ED Discharge Orders     None         Lenard Simmer, PA 04/15/22 1952    Lenard Simmer, [place] 04/15/22 2055    Charlynne Pander, MD 04/16/22 [phone]

## 2022-04-15 NOTE — Hospital Course (Addendum)
Melvin Hudson is a 67 y.o. 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   Recurrent Diverticular Bleed Severe colonic diverticulosis Presented again same day of discharge with painless hematochezia with no active extravasation on CTA of the abdomen pelvis.  Hemoglobin was 8.2 on presentation to the ED which is down from 8.4 from discharge earlier that morning which was likely a likely lab value.  He was hypotensive and tachycardic and required 3 units of PRBCs and IV fluid rehydration.  Hemoglobin dropped to 7.5 the following morning.  GI saw the patient again but without any active bleed no further intervention.  He did not have another bowel movement during admission until the morning of discharge when he had 2 bowel movements without fecal urgency that had a little bit of old blood.  Orthostatics were checked after these bowel movements and were negative.  H&H was rechecked in the afternoon with stable hemoglobin at 8.3.  He was discharged with close outpatient follow-up on 04/21/2022.  He was also instructed to continue to take Metamucil and add MiraLAX and Senokot for soft bowel movements.  He will need a colonoscopy in 2026.   Syncope, likely vasovagal On arrival to the ED with his low blood pressures he was falling asleep or passing out on the stretcher.  No syncope with ground-level falls.  No further symptoms after receiving 3 units of blood and IV fluids he did not have any more orthostatic dizziness or syncope.  No chest pain, postictal period, bowel or bladder incontinence, or arrhythmias noted during admission or associated with his syncope.  Orthostatics were positive during admission without symptoms  but on the final day orthostatics were negative.  His antihypertensives were held with instructions to discuss them with his PCP before resuming.    Iron deficiency anemia Recent Iron 27 with saturation of 9 and ferritin level of 10.  Iron deficit of 1500, 1 unit of blood is 200 mg of iron. Received 3 units of pRBCs this admission 1 unit last admission.  Also got IV iron 250 mg twice at last admission.  He got 3 more doses of IV iron 250 mg and will continue oral iron replacement outpatient.   Hypocalcemia Presented with low calcium of 8 on metabolic panel which was confirmed with ionized calcium (taken prior to blood transfusion) that was low at 0.87.  This level was taken prior to blood transfusions. 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  Workup showed supra therapeutic vitamin D, normal alk phos, normal phosphate, and normal PTH.  He was put on calcium carbonate 1000 mg (400 mg elemental calcium) 3 times daily with meals.   Hypertension HFpEF Last echo from 03/28/2022 with LVEF 50 to 55% with mild eccentric LVH. Medications include BiDil, carvedilol, enalapril, eplerenone, nifedipine, Farxiga, and atorvastatin. Antihypertensives were held with low blood pressures and he will discuss restarting that with his PCP on Monday.   Type 2 diabetes A1c 4.9. Patient takes metformin 500 mg twice daily, Farxiga 10 mg daily, and Ozempic 0.5 mg weekly at home. Continued metformin 500 mg twice daily and Farxiga 10 mg daily.

## 2022-04-15 NOTE — ED Triage Notes (Signed)
Patient bib [facility] from home with complaints of GI bleeding. Per ems patient just got discharged this morning went home had a huge bloody bowel movement. Patient did go unresponsive in hallway waiting on room. He is currently A&Ox4. BP is 95/71

## 2022-04-15 NOTE — H&P (Cosign Needed Addendum)
Date: 04/15/2022         Patient Name:  Melvin Hudson MRN: [id number]  DOB: 10/12/1955 Age / Sex: 67 y.o., male   PCP: [name], MD         Medical Service: Internal Medicine Teaching Service         Attending Physician: Dr. Charise Killian, MD    First Contact: Dr. Nikki Dom Pager: [phone]  Second Contact: Dr. 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  He was discharged earlier today after hospitalization for GI bleeding. He went to eat some french fries and ginger ale on his way home. As soon as he got home, he went to the bathroom at home and noticed blood in his stool. Quantity was enough to turn the bowl pink. This happened one additional time prior to calling EMS. These episodes were associated with weakness. He did not have blood in his stool for a couple days while he was in the hospital, but he was constipated and did not have a bowel movement. He did pass a little bit of diarrhea before he was discharged, but there was no blood at that time.  Denies any abdominal pain or pain when he has bowel movements.  Prior to rooming in ED, the patient had 2 syncopal episodes. Both were preceded by sweating. Denied any dizziness, chest pain, palpitations, shortness of breath prior to syncope, and also denies those symptoms right now. He never had a syncopal episode the last hospitalization.   3 units of blood administered in ED. GI called and recommended CTA abdomen, which showed no active extravasation.  Review of Systems  Respiratory:  Negative  for shortness of breath.   Cardiovascular:  Negative for chest pain and palpitations.  Gastrointestinal:  Positive for blood in stool. Negative for abdominal pain.  Neurological:  Negative for dizziness.   Current Outpatient Medications  Medication Instructions   allopurinol (ZYLOPRIM) 300 mg, Oral, Daily   atorvastatin (LIPITOR) 40 mg, Oral, Daily at bedtime   Blood Glucose Monitoring Suppl (ONETOUCH VERIO FLEX SYSTEM) w/Device KIT USE TO CHECK BLOOD SUGAR TWICE DAILY   calcium citrate-vitamin D 500-500 MG-UNIT chewable tablet 0.5-1 tablets, Oral, Daily, Take 1 tablet by mouth in the morning and a half tablet at bedtime.    clonazePAM (KLONOPIN) 1 mg, Oral, 3 times daily PRN   diphenhydrAMINE (BENADRYL) 25 mg, Oral, Every 6 hours   Farxiga 10 mg, Oral, Every morning   [START ON 04/16/2022] FeroSul 325 mg, Oral, Every M-W-F   gabapentin (NEURONTIN) 300 mg, Oral, 3 times daily   metFORMIN (GLUCOPHAGE) 500 mg, Oral, 2 times daily with meals   NARCAN 4 MG/0.1ML LIQD nasal spray kit 1 spray, Nasal, Once PRN   omeprazole (PRILOSEC) 20 mg, Oral, Daily   ONETOUCH VERIO test strip USE TO CHECK BLOOD SUGAR TWICE DAILY   oxyCODONE-acetaminophen (PERCOCET) 10-325 MG per tablet 1 tablet, Oral, Every 6 hours PRN   Ozempic (0.25 or 0.5 MG/DOSE) 0.25 mg, Subcutaneous, Weekly, Sunday's   psyllium (HYDROCIL/METAMUCIL) 95 % PACK 1  packet, Oral, Daily   Vitamin D (Ergocalciferol) (DRISDOL) 50,000 Units, Oral, Every Friday.   Allergies: ACEi and ARB  Past Medical History: Notable for recent GI bleeding, thought to be diverticular HTN Diabetes, not insulin dependent  Surgical History: Last colonoscopy 2016  Family History:  Hypertension and stroke in mother Diabetes in father   Social History:  Lives alone at home Support: Family in the area Level of Function: Independent in IADLs and ADLs PCP: [name], MD Substances: 60-pack-year history, quit smoking 9 years ago.  Remote history of  heavy drinking, sober for 35 years.  No current or prior IV drug use.  Physical Exam: Blood pressure 98/72, pulse 82, temperature 97.9 F (36.6 C), temperature source Oral, resp. rate (!) 22, SpO2 100 %.  Non-distressed, comfortable-appearing Heart rate normal and regular, bilateral radial pulses 2+, trace lower extremity edema Breathing is regular and unlabored, anterior lung fields clear to auscultation Abdomen is soft and non-tender Skin is warm and dry Blood clots from anus, otherwise normal to visual inspection without external hemorrhoids No gross neurologic deficits Pleasant, mood and affect concordant  EKG: Sinus rhythm, alternating wide QRS complex preceded by P wave, no AV block, no ST elevation or depression  Labs:  Creatinine 1.3 up from 1 Hemoglobin 7.8 down from 8.4 Calcium, ionized 0.89  Images and other studies: CTA abdomen/pelvis shows no evidence of active GI hemorrhage, notable for severe distal colonic diverticulosis, 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 with recent history of GI bleed thought to be due diverticular bleeding who presents with recurrent bleeding from rectum, currently hemodynamically stable without evidence of active bleed on imaging.   Principal Problem:   GI bleed Active Problems:   HTN (hypertension)   Gout   OSA (obstructive sleep apnea)   Chronic pain syndrome   Anxiety state   AKI (acute kidney injury) (HCC)   Chronic diastolic heart failure (HCC)   Syncope   Nonspecific abnormal electrocardiogram (ECG) (EKG)  GI bleed Recurrent bright red blood filling toilet bowl despite no evidence of bleeding for approximately 2 days prior to discharge. Visualized passage of clots on exam. Hemodynamically stable without bleeding on CTA. Hemoglobin only mildly decreased from last value. Bleeding episode for which he was recently hospitalized thought to be  diverticular. Wonder if a clot may have dislodged, leading to re-bleeding. Given his other symptoms including syncope, mild AKI, and PVCs, I don't think this is passage of old blood. PT/INR somewhat elevated during last admission--will repeat in AM. Status post 2 u PRBC for symptomatic anemia. Can defer further pending repeat CBC. Admit for observation and NPO status, anticipate repeat GI evaluation in the morning. - Holding home BP and antiplatelet - PT/INR AM - Transfuse for hemoglobin < 7 - NPO except for sips with meds - Continue psyllium  Syncope Patient describes a prodrome of diaphoresis. Not associated with positional changes. No chest pain or palpitations. Impression is for reflex syncope, likely vasovagal in setting of bleeding. Lower concern for cardiogenic or orthostatic syncope. Will monitor on telemetry for 24 hours.  - Telemetry  EKG changes Bigeminy pattern on telemetry. Effective, sending pulses distally. Review of EKG shows alternating wide QRS complexes, all QRS complexes preceded by P waves. EKG from 04/13/22 notable for absence of this finding. No known history of cardiac arrhythmia. Low concern for ischemic event. Suspect a benign process in setting of acute illness. Follows with  [facility] for chronic diastolic heart failure. - Telemetry  AKI Mild, with serum creatinine elevated to 1.3 from baseline of 1. Impression is for prerenal etiology, likely due to mild volume contraction in setting of GI bleed and acute illness without hemodynamic instability. - LR 1000 mL bolus  Gout Restart home allopurinol in AM.  Chronic pain syndrome Chart review suggests this is prescribed for leg pain. Home oxycodone continued, but consider assessing impact of this therapy on constipation and diverticulosis. - Oxycodone-acetaminophen 10-325 mg PO q6h PRN for severe pain - Gabapentin 300 mg PO TID  Anxiety state Continued home clonazepam.  Hypertension Chronic diastolic  heart failure Well-compensated. Euvolemic. Holding home anti-hypertensive in setting of GI bleeding.  OSA - CPAP QHS  Diet: NPO sips with meds IVF: LR bolus VTE: SCD Code: Full  Admit patient to Observation with expected length of stay less than 2 midnights.  Signed: Nani Gasser MD 04/15/2022, 10:25 PM  Pager: [phone] After 5pm on weekdays and 1pm on weekends: [phone]

## 2022-04-15 NOTE — Telephone Encounter (Signed)
RTC to patient states was discharged from the hospital today went to the [facility]  and is bleeding bright red blood from his rectum.  Patient ws advised to come to the ER as soon as possible.  Patient has someone with him to drive him over to the ER as soon as possible.

## 2022-04-15 NOTE — Progress Notes (Signed)
Mobility Specialist Progress Note   04/15/22 1030  Mobility  Activity Ambulated with assistance in hallway;Ambulated with assistance to bathroom  Level of Assistance Modified independent, requires aide device or extra time  Assistive Device [employer] Ambulated (ft) 1000 ft (+)  Range of Motion/Exercises Active;All extremities  Activity Response Tolerated well   Patient received in supine and agreeable to participate. Ambulated in to bathroom and in hallway mod I with steady gait. Returned to room without complaint or incident. Was left in chair with all needs met, call bell in reach.   [place] Primrose Oler, BS EXP Mobility Specialist Please contact via SecureChat or Rehab office at [phone]

## 2022-04-15 NOTE — Care Management Important Message (Signed)
Important Message  Patient Details  Name: Melvin Hudson MRN: [id number] Date of Birth: December 03, 1955   Medicare Important Message Given:  Yes     [name] 04/15/2022, 10:46 AM

## 2022-04-15 NOTE — Telephone Encounter (Signed)
Patient called he stated he is currently bleeding from his rectum please give patient  call.

## 2022-04-15 NOTE — Discharge Summary (Addendum)
Name: Melvin Hudson MRN: [id number] DOB: 31-May-1955 67 y.o. PCP: [name], MD  Date of Admission: 04/13/2022  9:23 AM Date of Discharge: 04/15/2022 Attending Physician: Dr.  Saverio Danker  Discharge Diagnosis: Active Problems:   GI bleed    Discharge Medications: Allergies as of 04/15/2022       Reactions   Azilsartan Itching   Avoid ARB and ACEI per MD   Advil [ibuprofen]    Feels "jittery"        Medication List     STOP taking these medications    aspirin 81 MG tablet   BiDil 20-37.5 MG tablet Generic drug: isosorbide-hydrALAZINE   carvedilol 25 MG tablet Commonly known as: COREG   enalapril 2.5 MG tablet Commonly known as: VASOTEC   eplerenone 50 MG tablet Commonly known as: INSPRA   NIFEdipine 90 MG 24 hr tablet Commonly known as: ADALAT CC       TAKE these medications    allopurinol 300 MG tablet Commonly known as: ZYLOPRIM Take 300 mg by mouth daily.   atorvastatin 40 MG tablet Commonly known as: LIPITOR Take 1 tablet (40 mg total) by mouth at bedtime.   calcium citrate-vitamin D 500-500 MG-UNIT chewable tablet Chew 0.5-1 tablets by mouth daily. Take 1 tablet by mouth in the morning and a half tablet at bedtime.   clonazePAM 1 MG disintegrating tablet Commonly known as: KLONOPIN Take 1 mg by mouth 3 (three) times daily as needed (Anxiety).   diphenhydrAMINE 25 MG tablet Commonly known as: BENADRYL Take 1 tablet (25 mg total) by mouth every 6 (six) hours. What changed:  when to take this reasons to take this   Farxiga 10 MG Tabs tablet Generic drug: dapagliflozin propanediol Take 1 tablet (10 mg total) by mouth every morning.   FeroSul 325 (65 FE) MG tablet Generic drug: ferrous sulfate Take 1 tablet (325 mg total) by mouth every Monday, Wednesday, and Friday. Start taking on: April 16, 2022   gabapentin 300 MG capsule Commonly known as: NEURONTIN Take 300 mg by mouth 3 (three) times daily.   metFORMIN 500 MG tablet Commonly  known as: GLUCOPHAGE Take 500 mg by mouth 2 (two) times daily with a meal.   Narcan 4 MG/0.1ML Liqd nasal spray kit Generic drug: naloxone Place 1 spray into the nose once as needed (Overdose).   omeprazole 20 MG capsule Commonly known as: PRILOSEC Take 20 mg by mouth daily.   OneTouch Verio Flex System w/Device Kit USE TO CHECK BLOOD SUGAR TWICE DAILY   OneTouch Verio test strip Generic drug: glucose blood USE TO CHECK BLOOD SUGAR TWICE DAILY   oxyCODONE-acetaminophen 10-325 MG tablet Commonly known as: PERCOCET Take 1 tablet by mouth every 6 (six) hours as needed for pain. What changed: when to take this   Ozempic (0.25 or 0.5 MG/DOSE) 2 MG/1.5ML Sopn Generic drug: Semaglutide(0.25 or 0.'5MG'$ /DOS) Inject 0.25 mg into the skin once a week. Sunday's   psyllium 95 % Pack Commonly known as: HYDROCIL/METAMUCIL Take 1 packet by mouth daily.   Vitamin D (Ergocalciferol) 1.25 MG (50000 UNIT) Caps capsule Commonly known as: DRISDOL Take 50,000 Units by mouth. Every Friday.        Disposition and follow-up:   [name] was discharged from [facility] in Good condition.  At the hospital follow up visit please address:  1.  Follow-up:  a.  Please follow-up on hemoglobin levels to ensure that he is not still losing blood.  Also ensure that he may  resume his antihypertensives and heart failure medications.  They were held on discharge due to normotension on discharge w/ hypotension on admission.    b.  Reevaluate the need for metformin with A1c of 4.9.  2.  Labs / imaging needed at time of follow-up: CBC  3.  Pending labs/ test needing follow-up: None  Follow-up Appointments:  Follow-up Information     [name], MD. Call.   Specialty: Family Medicine Contact information: [place] [address] [address] Hospital Course by problem list: Melvin Hudson is a 67 y.o. 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     GI bleed, most likely diverticular Presented with painless hematochezia with hemoglobin down to 9.1.  No bowel movements in the ED and received 1 unit PRBCs and platelets.  He is on aspirin at home but not any other antiplatelets or blood thinners.  GI was consulted in ED.  He was initially hypotensive but had taken all of his antihypertensives that morning.  Pressure has been stable while getting blood and fluids with MAP above 70.  Otherwise hemodynamically stable.  CT of the abdomen and pelvis showed no active extravasation but did show severe diverticulosis with acute inflammatory changes. Bleeding most likely resolved just before presentation to the ED.  He did not had any more bloody bowel movements for ~48 hours but did have a normal bowel movement here.  GI have seen the patient again and did not pursue colonoscopy since he is not bleeding and stable. Posttransfusion hemoglobin of 10.0 and hemoglobin of 8.6 the following morning and stable the next day at 8.4. We held his antihypertensives during admission with plans to restart them at PCP follow up as tolerated. Advised to not resume aspirin. He will need a colonoscopy in 2026.   Iron deficiency anemia ?Thrombocytopenia ?Pancytopenia Platelets on admission of 7000 without any history of thrombocytopenia. White blood cell count of 2000. All cell lines down on CBC. No abnormal bleeding other than hematochezia. He received a unit of platelets in the ED. These abnormalities resolved on repeat draw with platelets around 250,000 and white count up to normal which was suspicious for laboratory error.  Peripheral smear did show ovalocytes but normal platelet and white blood cell morphology.   LDH also within normal limits.  B12 and folate within normal limits.  Iron 27 with saturation of 9 and ferritin level of 10.  Iron deficit of 1500, 1 unit of blood is 200 mg of iron. IV iron 250 mg twice while admitted and discharged on ferrous sulfate 325 mg 3 times weekly.    Hypertension HFpEF Last echo from 03/28/2022 with LVEF 50 to 55% with mild eccentric LVH.  Patient on multiple medications for blood pressure and heart failure. Medications include BiDil, carvedilol, enalapril, eplerenone, nifedipine, Farxiga, and atorvastatin.  Held antihypertensive agents as above. Resumed Farxiga and atorvastatin during admission.   Type 2 diabetes A1c 4.9 here.  Patient takes metformin 500 mg twice daily, Farxiga 10 mg daily, and Ozempic 0.5 mg weekly at home. Resumed metformin 500 mg twice daily and Farxiga 10 mg daily.  We recommended that he discuss his medications with his primary care due to  his low A1c.   History of squamous cell tonsillar cancer s/p radiation in 2016 Patient notes that he has stopped following up with his oncologist due to stability after radiation treatment.  No current signs of recurrence but will continue to follow-up with his primary care.   OSA CPAP while admitted.   Anxiety Patient takes clonazepam 1 mg 3 times daily as needed at home and we continued this during admission.   Discharge Subjective: Patient is doing well this morning without any new or worsening complaints.  He did not have a bowel movement yesterday.  He is ready to go home.  Discharge Exam:   BP 128/67 (BP Location: Right Arm)   Pulse 77   Temp 98.2 F (36.8 C) (Oral)   Resp 17   Ht '6\' 2"'$  (1.88 m)   Wt (!) 137.4 kg   SpO2 98%   BMI 38.90 kg/m  Constitutional: Elderly male sitting in bed. In no acute distress. Cardio:Regular rate and rhythm Pulm:Clear to auscultation bilaterally. Normal work of breathing on room air. Abdomen: Soft, non-tender, non-distended, positive bowel sounds. [id number]  for extremity edema. Skin:Warm and dry.  No pallor. Neuro:Alert and oriented x3. No focal deficit noted.  Pertinent Labs, Studies, and Procedures:     Latest Ref Rng & Units 04/15/2022    1:05 AM 04/14/2022    1:11 AM 04/13/2022    7:48 PM  CBC  WBC 4.0 - 10.5 K/uL 8.6  9.9  10.6   Hemoglobin 13.0 - 17.0 g/dL 8.4  8.6  10.0   Hematocrit 39.0 - 52.0 % 25.3  25.3  29.2   Platelets 150 - 400 K/uL 229  241  259        Latest Ref Rng & Units 04/14/2022    1:11 AM 04/13/2022    9:02 PM 04/13/2022   11:31 AM  CMP  Glucose 70 - 99 mg/dL 89  90  138   BUN 8 - 23 mg/dL '13  13  13   '$ Creatinine 0.61 - 1.24 mg/dL 0.99  0.99  1.21   Sodium 135 - 145 mmol/L 137  139  137   Potassium 3.5 - 5.1 mmol/L 4.0  4.4  5.2   Chloride 98 - 111 mmol/L 104  106  104   CO2 22 - 32 mmol/L '24  25  26   '$ Calcium 8.9 - 10.3 mg/dL 8.4  8.8  8.7   Total Protein 6.5 - 8.1 g/dL 5.1   6.0   Total Bilirubin 0.3 - 1.2 mg/dL 1.0   0.9   Alkaline Phos 38 - 126 U/L 98   121   AST 15 - 41 U/L 15   30   ALT 0 - 44 U/L 13   17     CT Angio Abd/Pel W and/or Wo Contrast  Result Date: 04/13/2022 CLINICAL DATA:  Lower gastrointestinal hemorrhage EXAM: CTA ABDOMEN AND PELVIS WITHOUT AND WITH CONTRAST TECHNIQUE: Multidetector CT imaging of the abdomen and pelvis was performed using the standard protocol during bolus administration of intravenous contrast. Multiplanar reconstructed images and MIPs were obtained and reviewed to evaluate the vascular anatomy. RADIATION DOSE REDUCTION: This exam was performed according to the departmental dose-optimization program which includes automated exposure control, adjustment of the mA and/or kV according to patient size and/or use of iterative reconstruction technique. CONTRAST:  38m OMNIPAQUE IOHEXOL 350 MG/ML SOLN COMPARISON:  PET CT 01/19/2014 FINDINGS: VASCULAR Aorta: Normal caliber aorta without aneurysm, dissection, vasculitis or significant stenosis. Celiac: High-grade (  greater than 75%)  stenosis of the a celiac axis proximally secondary to mass effect by the median arcuate ligament. Distally widely patent. SMA: Patent without evidence of aneurysm, dissection, vasculitis or significant stenosis. Renals: Dual renal arteries bilaterally. No evidence of hemodynamically significant stenosis. Normal vascular morphology. No aneurysm or dissection. IMA: Patent without evidence of aneurysm, dissection, vasculitis or significant stenosis. Inflow: Patent without evidence of aneurysm, dissection, vasculitis or significant stenosis. Proximal Outflow: Bilateral common femoral and visualized portions of the superficial and profunda femoral arteries are patent without evidence of aneurysm, dissection, vasculitis or significant stenosis. Veins: Unremarkable Review of the MIP images confirms the above findings. NON-VASCULAR Lower chest: No acute abnormality. Hepatobiliary: No focal liver abnormality is seen. No gallstones, gallbladder wall thickening, or biliary dilatation. Pancreas: Unremarkable Spleen: Unremarkable Adrenals/Urinary Tract: Adrenal glands are unremarkable. Kidneys are normal, without renal calculi, focal lesion, or hydronephrosis. Bladder is unremarkable. Stomach/Bowel: No active gastrointestinal hemorrhage identified. Severe descending and sigmoid colonic diverticulosis without superimposed acute inflammatory change. The stomach, small bowel, and large bowel are otherwise unremarkable. Appendix normal. No free intraperitoneal gas or fluid. Lymphatic: No pathologic adenopathy within the abdomen and pelvis. Reproductive: Prostate is unremarkable. Other: No abdominal wall hernia or abnormality. No abdominopelvic ascites. Musculoskeletal: Trabecular coarsening and cortical thickening involving the left ilium, L2, and L5 vertebral bodies suggests Paget's disease of the bone. No acute bone abnormality. No focal lytic or blastic bone lesion. IMPRESSION: 1. No active gastrointestinal hemorrhage identified.  2. 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 3. Severe distal colonic diverticulosis without superimposed acute inflammatory change. 4. 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 Aortic Atherosclerosis (ICD10-I70.0). Electronically Signed   By: Fidela [name] M.D.   On: 04/13/2022 18:35   DG CHEST PORT 1 VIEW  Result Date: 04/13/2022 CLINICAL DATA:  GI bleeding. EXAM: PORTABLE CHEST 1 VIEW COMPARISON:  04/13/2014 FINDINGS: The heart size and mediastinal contours are within normal limits. Both lungs are clear. IMPRESSION: No active disease. Electronically Signed   By: Marlaine Hind M.D.   On: 04/13/2022 10:16     Discharge Instructions: Discharge Instructions     Call MD for:  difficulty breathing, headache or visual disturbances   Complete by: As directed    Call MD for:  extreme fatigue   Complete by: As directed    Call MD for:  persistant dizziness or light-headedness   Complete by: As directed    Call MD for:  persistant nausea and vomiting   Complete by: As directed    Call MD for:  severe uncontrolled pain   Complete by: As directed    Diet - low sodium heart healthy   Complete by: As directed    Increase activity slowly   Complete by: As directed       Melvin Hudson,  You were recently admitted to [facility] for a lower gastrointestinal bleed most likely from a diverticulum that fortunately resolved on its own.   Continue taking your home medications with the following changes  Start taking Ferrous sulfate 325 mg every Monday, Wednesday, and Friday Metamucil daily Stop taking Aspirin Do not resume until you talk to your primary care doctor BiDil (isosorbide dinitrate/hydralazine) Carvedilol Enalapril Eplerenone Nifedipine   You should seek further medical care if you notice any more bloody bowel movements, have any abdominal pain,  feels severely fatigued or weak, or any other  bothersome symptoms.  We recommend that you see your primary care doctor in about a week to make sure that you continue to improve. We are so glad that you are feeling better.  Sincerely, Johny Blamer, DO Signed: Johny Blamer, DO 04/15/2022, 3:49 PM   Pager: [phone]

## 2022-04-16 ENCOUNTER — Encounter (HOSPITAL_COMMUNITY): Payer: Self-pay | Admitting: Internal Medicine

## 2022-04-16 ENCOUNTER — Other Ambulatory Visit: Payer: Self-pay

## 2022-04-16 DIAGNOSIS — K5791 Diverticulosis of intestine, part unspecified, without perforation or abscess with bleeding: Secondary | ICD-10-CM

## 2022-04-16 DIAGNOSIS — R55 Syncope and collapse: Secondary | ICD-10-CM

## 2022-04-16 DIAGNOSIS — F411 Generalized anxiety disorder: Secondary | ICD-10-CM | POA: Diagnosis present

## 2022-04-16 DIAGNOSIS — Z7951 Long term (current) use of inhaled steroids: Secondary | ICD-10-CM | POA: Diagnosis not present

## 2022-04-16 DIAGNOSIS — Z79899 Other long term (current) drug therapy: Secondary | ICD-10-CM | POA: Diagnosis not present

## 2022-04-16 DIAGNOSIS — D509 Iron deficiency anemia, unspecified: Secondary | ICD-10-CM

## 2022-04-16 DIAGNOSIS — M8888 Osteitis deformans of other bones: Secondary | ICD-10-CM | POA: Diagnosis present

## 2022-04-16 DIAGNOSIS — Z85818 Personal history of malignant neoplasm of other sites of lip, oral cavity, and pharynx: Secondary | ICD-10-CM | POA: Diagnosis not present

## 2022-04-16 DIAGNOSIS — D500 Iron deficiency anemia secondary to blood loss (chronic): Secondary | ICD-10-CM | POA: Diagnosis present

## 2022-04-16 DIAGNOSIS — N179 Acute kidney failure, unspecified: Secondary | ICD-10-CM | POA: Diagnosis present

## 2022-04-16 DIAGNOSIS — G4733 Obstructive sleep apnea (adult) (pediatric): Secondary | ICD-10-CM | POA: Diagnosis present

## 2022-04-16 DIAGNOSIS — I951 Orthostatic hypotension: Secondary | ICD-10-CM | POA: Diagnosis present

## 2022-04-16 DIAGNOSIS — Z8249 Family history of ischemic heart disease and other diseases of the circulatory system: Secondary | ICD-10-CM | POA: Diagnosis not present

## 2022-04-16 DIAGNOSIS — M109 Gout, unspecified: Secondary | ICD-10-CM | POA: Diagnosis present

## 2022-04-16 DIAGNOSIS — K5731 Diverticulosis of large intestine without perforation or abscess with bleeding: Secondary | ICD-10-CM | POA: Diagnosis present

## 2022-04-16 DIAGNOSIS — G894 Chronic pain syndrome: Secondary | ICD-10-CM | POA: Diagnosis present

## 2022-04-16 DIAGNOSIS — I110 Hypertensive heart disease with heart failure: Secondary | ICD-10-CM | POA: Diagnosis present

## 2022-04-16 DIAGNOSIS — Z7984 Long term (current) use of oral hypoglycemic drugs: Secondary | ICD-10-CM | POA: Diagnosis not present

## 2022-04-16 DIAGNOSIS — Z833 Family history of diabetes mellitus: Secondary | ICD-10-CM | POA: Diagnosis not present

## 2022-04-16 DIAGNOSIS — Z923 Personal history of irradiation: Secondary | ICD-10-CM | POA: Diagnosis not present

## 2022-04-16 DIAGNOSIS — Z823 Family history of stroke: Secondary | ICD-10-CM | POA: Diagnosis not present

## 2022-04-16 DIAGNOSIS — E119 Type 2 diabetes mellitus without complications: Secondary | ICD-10-CM | POA: Diagnosis present

## 2022-04-16 DIAGNOSIS — I5032 Chronic diastolic (congestive) heart failure: Secondary | ICD-10-CM | POA: Diagnosis present

## 2022-04-16 DIAGNOSIS — E669 Obesity, unspecified: Secondary | ICD-10-CM | POA: Diagnosis present

## 2022-04-16 DIAGNOSIS — Z87891 Personal history of nicotine dependence: Secondary | ICD-10-CM | POA: Diagnosis not present

## 2022-04-16 DIAGNOSIS — Z6838 Body mass index (BMI) 38.0-38.9, adult: Secondary | ICD-10-CM | POA: Diagnosis not present

## 2022-04-16 LAB — CBC
HCT: 25.5 % — ABNORMAL LOW (ref 39.0–52.0)
Hemoglobin: 8.4 g/dL — ABNORMAL LOW (ref 13.0–17.0)
MCH: 26.8 pg (ref 26.0–34.0)
MCHC: 32.9 g/dL (ref 30.0–36.0)
MCV: 81.5 fL (ref 80.0–100.0)
Platelets: 196 10*3/uL (ref 150–400)
RBC: 3.13 MIL/uL — ABNORMAL LOW (ref 4.22–5.81)
RDW: 18.6 % — ABNORMAL HIGH (ref 11.5–15.5)
WBC: 11 10*3/uL — ABNORMAL HIGH (ref 4.0–10.5)
nRBC: 0.8 % — ABNORMAL HIGH (ref 0.0–0.2)

## 2022-04-16 LAB — PROTIME-INR
INR: 1.3 — ABNORMAL HIGH (ref 0.8–1.2)
Prothrombin Time: 16.1 seconds — ABNORMAL HIGH (ref 11.4–15.2)

## 2022-04-16 LAB — BPAM RBC
Blood Product Expiration Date: 202403182359
Blood Product Expiration Date: 202403282359
Blood Product Expiration Date: 202403282359
ISSUE DATE / TIME: 202402271631
ISSUE DATE / TIME: 202402271843
ISSUE DATE / TIME: 202402272138
Unit Type and Rh: 5100
Unit Type and Rh: 5100
Unit Type and Rh: 5100

## 2022-04-16 LAB — BASIC METABOLIC PANEL
Anion gap: 4 — ABNORMAL LOW (ref 5–15)
BUN: 11 mg/dL (ref 8–23)
CO2: 27 mmol/L (ref 22–32)
Calcium: 8 mg/dL — ABNORMAL LOW (ref 8.9–10.3)
Chloride: 107 mmol/L (ref 98–111)
Creatinine, Ser: 0.98 mg/dL (ref 0.61–1.24)
GFR, Estimated: >60 mL/min (ref >60)
Glucose, Bld: 96 mg/dL (ref 70–99)
Potassium: 3.6 mmol/L (ref 3.5–5.1)
Sodium: 138 mmol/L (ref 135–145)

## 2022-04-16 LAB — TYPE AND SCREEN
ABO/RH(D): O POS
Antibody Screen: NEGATIVE
Unit division: 0
Unit division: 0
Unit division: 0

## 2022-04-16 LAB — PHOSPHORUS: Phosphorus: 2.9 mg/dL (ref 2.5–4.6)

## 2022-04-16 LAB — ALKALINE PHOSPHATASE: Alkaline Phosphatase: 91 U/L (ref 38–126)

## 2022-04-16 LAB — HEMOGLOBIN AND HEMATOCRIT, BLOOD
HCT: 26.1 % — ABNORMAL LOW (ref 39.0–52.0)
Hemoglobin: 9 g/dL — ABNORMAL LOW (ref 13.0–17.0)

## 2022-04-16 LAB — BLOOD PRODUCT ORDER (VERBAL) VERIFICATION

## 2022-04-16 LAB — VITAMIN D 25 HYDROXY (VIT D DEFICIENCY, FRACTURES): Vit D, 25-Hydroxy: 108.38 ng/mL — ABNORMAL HIGH (ref 30–100)

## 2022-04-16 LAB — MAGNESIUM: Magnesium: 2 mg/dL (ref 1.7–2.4)

## 2022-04-16 MED ORDER — SODIUM CHLORIDE 0.9 % IV SOLN
250.0000 mg | Freq: Every day | INTRAVENOUS | Status: DC
  Administered 2022-04-16: 250 mg via INTRAVENOUS
  Filled 2022-04-16 (×2): qty 20

## 2022-04-16 MED ORDER — DAPAGLIFLOZIN PROPANEDIOL 10 MG PO TABS
10.0000 mg | ORAL_TABLET | Freq: Every morning | ORAL | Status: DC
  Administered 2022-04-16 – 2022-04-18 (×3): 10 mg via ORAL
  Filled 2022-04-16 (×3): qty 1

## 2022-04-16 MED ORDER — CLONAZEPAM 0.5 MG PO TABS: 1.0000 mg | ORAL_TABLET | Freq: Three times a day (TID) | ORAL | Status: DC | PRN

## 2022-04-16 MED ORDER — METFORMIN HCL 500 MG PO TABS
500.0000 mg | ORAL_TABLET | Freq: Two times a day (BID) | ORAL | Status: DC
  Administered 2022-04-17 – 2022-04-18 (×2): 500 mg via ORAL
  Filled 2022-04-16 (×3): qty 1

## 2022-04-16 NOTE — Progress Notes (Signed)
Subjective: Patient was seen yesterday and discharged.  However he got home and had 4 episodes of painless bloody bowel movement.  He called the ambulance and had 2 more episodes of bloody bowel movement in the ER.  None since yesterday.  Has required 3 units of PRBC transfusion.  Objective: Vital signs in last 24 hours: Temp:  [97.7 F (36.5 C)-98.7 F (37.1 C)] 97.7 F (36.5 C) (02/28 1211) Pulse Rate:  [64-102] 64 (02/28 1211) Resp:  [12-25] 19 (02/28 1211) BP: (84-128)/(57-94) 128/69 (02/28 1211) SpO2:  [76 %-100 %] 100 % (02/28 1211) Weight:  [135.5 kg] 135.5 kg (02/28 1209) Weight change:  Last BM Date : 04/15/22  PE: Obese GENERAL: Mild pallor, not in distress ABDOMEN: Nondistended, soft, nontender abdomen, normoactive bowel sounds EXTREMITIES: No deformity  Lab Results: Results for orders placed or performed during the hospital encounter of 04/15/22 (from the past 48 hour(s))  I-stat chem 8, ED (not at [facility], [facility] or [facility])     Status: Abnormal   Collection Time: 04/15/22  4:19 PM  Result Value Ref Range   Sodium 138 135 - 145 mmol/L   Potassium 3.7 3.5 - 5.1 mmol/L   Chloride 107 98 - 111 mmol/L   BUN 13 8 - 23 mg/dL   Creatinine, Ser 1.30 (H) 0.61 - 1.24 mg/dL   Glucose, Bld 172 (H) 70 - 99 mg/dL    Comment: Glucose reference range applies only to samples taken after fasting for at least 8 hours.   Calcium, Ion 0.89 (LL) 1.15 - 1.40 mmol/L   TCO2 23 22 - 32 mmol/L   Hemoglobin 7.8 (L) 13.0 - 17.0 g/dL   HCT 23.0 (L) 39.0 - 52.0 %   Comment NOTIFIED PHYSICIAN   Basic metabolic panel     Status: Abnormal   Collection Time: 04/15/22  4:21 PM  Result Value Ref Range   Sodium 139 135 - 145 mmol/L   Potassium 3.7 3.5 - 5.1 mmol/L   Chloride 105 98 - 111 mmol/L   CO2 24 22 - 32 mmol/L   Glucose, Bld 180 (H) 70 - 99 mg/dL    Comment: Glucose reference range applies only to samples taken after fasting for at least 8 hours.   BUN 11 8 - 23 mg/dL   Creatinine, Ser 1.31 (H)  0.61 - 1.24 mg/dL   Calcium 8.4 (L) 8.9 - 10.3 mg/dL   GFR, Estimated 59 (L) >60 mL/min    Comment: (NOTE) Calculated using the CKD-EPI Creatinine Equation (2021)    Anion gap 10 5 - 15    Comment: Performed at [facility] [address] [place], [place] [address]  CBC with Differential     Status: Abnormal   Collection Time: 04/15/22  4:21 PM  Result Value Ref Range   WBC 8.5 4.0 - 10.5 K/uL   RBC 3.32 (L) 4.22 - 5.81 MIL/uL   Hemoglobin 8.2 (L) 13.0 - 17.0 g/dL   HCT 26.3 (L) 39.0 - 52.0 %   MCV 79.2 (L) 80.0 - 100.0 fL   MCH 24.7 (L) 26.0 - 34.0 pg   MCHC 31.2 30.0 - 36.0 g/dL   RDW 18.5 (H) 11.5 - 15.5 %   Platelets 284 150 - 400 K/uL   nRBC 2.1 (H) 0.0 - 0.2 %   Neutrophils Relative % 67 %   Neutro Abs 5.7 1.7 - 7.7 K/uL   Lymphocytes Relative 16 %   Lymphs Abs 1.3 0.7 - 4.0 K/uL  Monocytes Relative 11 %   Monocytes Absolute 1.0 0.1 - 1.0 K/uL   Eosinophils Relative 4 %   Eosinophils Absolute 0.4 0.0 - 0.5 K/uL   Basophils Relative 1 %   Basophils Absolute 0.0 0.0 - 0.1 K/uL   Immature Granulocytes 1 %   Abs Immature Granulocytes 0.10 (H) 0.00 - 0.07 K/uL    Comment: Performed at [facility] [address] [place], [place] [address]  Type and screen     Status: None   Collection Time: 04/15/22  5:19 PM  Result Value Ref Range   ABO/RH(D) O POS    Antibody Screen NEG    Sample Expiration 04/18/2022,2359    Unit Number [id number]    Blood Component Type RBC LR PHER1    Unit division 00    Status of Unit ISSUED,FINAL    Unit tag comment VERBAL ORDERS PER DR DR. YAO    Transfusion Status OK TO TRANSFUSE    Crossmatch Result COMPATIBLE    Unit Number [id number]    Blood Component Type RBC LR PHER1    Unit division 00    Status of Unit ISSUED,FINAL    Transfusion Status OK TO TRANSFUSE    Crossmatch Result Compatible    Unit Number [id number]    Blood Component Type RED CELLS,LR    Unit division 00    Status of Unit ISSUED,FINAL     Transfusion Status OK TO TRANSFUSE    Crossmatch Result Compatible   Prepare RBC (crossmatch)     Status: None   Collection Time: 04/15/22  5:19 PM  Result Value Ref Range   Order Confirmation      ORDER PROCESSED BY BLOOD BANK Performed at [facility], [place] [address], [place], [place] [id number]   Basic metabolic panel     Status: Abnormal   Collection Time: 04/16/22  4:36 AM  Result Value Ref Range   Sodium 138 135 - 145 mmol/L   Potassium 3.6 3.5 - 5.1 mmol/L   Chloride 107 98 - 111 mmol/L   CO2 27 22 - 32 mmol/L   Glucose, Bld 96 70 - 99 mg/dL    Comment: Glucose reference range applies only to samples taken after fasting for at least 8 hours.   BUN 11 8 - 23 mg/dL   Creatinine, Ser 0.98 0.61 - 1.24 mg/dL   Calcium 8.0 (L) 8.9 - 10.3 mg/dL   GFR, Estimated >60 >60 mL/min    Comment: (NOTE) Calculated using the CKD-EPI Creatinine Equation (2021)    Anion gap 4 (L) 5 - 15    Comment: Performed at [facility] [address], [place], [place] [address]  CBC     Status: Abnormal   Collection Time: 04/16/22  4:36 AM  Result Value Ref Range   WBC 11.0 (H) 4.0 - 10.5 K/uL   RBC 3.13 (L) 4.22 - 5.81 MIL/uL   Hemoglobin 8.4 (L) 13.0 - 17.0 g/dL   HCT 25.5 (L) 39.0 - 52.0 %   MCV 81.5 80.0 - 100.0 fL   MCH 26.8 26.0 - 34.0 pg   MCHC 32.9 30.0 - 36.0 g/dL   RDW 18.6 (H) 11.5 - 15.5 %   Platelets 196 150 - 400 K/uL   nRBC 0.8 (H) 0.0 - 0.2 %    Comment: Performed at [facility] [address], [place],  [address]  Protime-INR     Status: Abnormal   Collection Time: 04/16/22  4:36 AM  Result Value Ref Range   Prothrombin Time 16.1 (H) 11.4 - 15.2 seconds   INR 1.3 (H) 0.8 - 1.2    Comment: (NOTE) INR goal varies based on device and disease states. Performed at [facility], [place] [address] [place], [place] [address]   Magnesium     Status: None   Collection Time: 04/16/22  4:36 AM  Result Value Ref Range   Magnesium 2.0 1.7 - 2.4 mg/dL     Comment: Performed at [facility] [address] [place], [place] [address]  Provider-confirm verbal Blood Bank order - RBC; 1 Unit; Order taken: 04/15/2022; 4:31 PM; Emergency Release     Status: None   Collection Time: 04/16/22  9:07 AM  Result Value Ref Range   Blood product order confirm      MD AUTHORIZATION REQUESTED DR Darl Householder Performed at [facility] [address] [place], [place] [address]   Hemoglobin and hematocrit, blood     Status: Abnormal   Collection Time: 04/16/22 11:55 AM  Result Value Ref Range   Hemoglobin 9.0 (L) 13.0 - 17.0 g/dL   HCT 26.1 (L) 39.0 - 52.0 %    Comment: Performed at [facility], [place] [address] [place], [place] [address]  Phosphorus     Status: None   Collection Time: 04/16/22 11:55 AM  Result Value Ref Range   Phosphorus 2.9 2.5 - 4.6 mg/dL    Comment: Performed at [facility] [address], [place], [place] [address]  VITAMIN D 25 Hydroxy (Vit-D Deficiency, Fractures)     Status: Abnormal   Collection Time: 04/16/22 11:55 AM  Result Value Ref Range   Vit D, 25-Hydroxy 108.38 (H) 30 - 100 ng/mL    Comment: (NOTE) Vitamin D deficiency has been defined by the [place] practice guideline as a level of serum 25-OH  vitamin D less than 20 ng/mL (1,2). The Endocrine Society went on to  further define vitamin D insufficiency as a level between 21 and 29  ng/mL (2).  1. IOM (Institute of Medicine). 2010. Dietary reference intakes for  calcium and D. [place]: The [employer]. 2. Holick MF, [place], Bischoff-Ferrari HA, et al. Evaluation,  treatment, and prevention of vitamin D deficiency: an Endocrine  Society clinical practice guideline, JCEM. 2011 Jul; 96(7): 1911-30.  Performed at [facility], [place] [address], [place], [place] [address]   Alkaline phosphatase     Status: None   Collection Time: 04/16/22 11:55 AM  Result Value Ref Range   Alkaline  Phosphatase 91 38 - 126 U/L    Comment: Performed at [facility] [address] [place],  [address]    Studies/Results: CT Angio Abd/Pel W and/or Wo Contrast  Result Date: 04/15/2022 CLINICAL DATA:  Lower gastrointestinal hemorrhage EXAM: CTA ABDOMEN AND PELVIS WITHOUT AND WITH CONTRAST TECHNIQUE: Multidetector CT imaging of the abdomen and pelvis was performed using the standard protocol during bolus administration of intravenous contrast. Multiplanar reconstructed images and MIPs were obtained and reviewed to evaluate the vascular anatomy. RADIATION DOSE REDUCTION: This exam was performed according to the departmental dose-optimization program which includes automated exposure control, adjustment of the mA and/or kV according to patient size and/or use of iterative reconstruction technique. CONTRAST:  148m OMNIPAQUE IOHEXOL 350 MG/ML SOLN COMPARISON:  None Available. FINDINGS: VASCULAR Aorta: Normal caliber aorta without aneurysm, dissection, vasculitis  or significant stenosis. 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 Distally widely patent. SMA: Patent without evidence of aneurysm, dissection, vasculitis or significant stenosis. Renals: Both renal arteries are patent without evidence of aneurysm, dissection, vasculitis, fibromuscular dysplasia or significant stenosis. IMA: Patent without evidence of aneurysm, dissection, vasculitis or significant stenosis. Inflow: Patent without evidence of aneurysm, dissection, vasculitis or significant stenosis. Mild atherosclerotic calcification. Internal iliac arteries are patent bilaterally. Proximal Outflow: Bilateral common femoral and visualized portions of the superficial and profunda femoral arteries are patent without evidence of aneurysm, dissection, vasculitis or significant stenosis. Veins: Unremarkable Review of the MIP images  confirms the above findings. NON-VASCULAR Lower chest: No acute abnormality. Hepatobiliary: No focal liver abnormality is seen. No gallstones, gallbladder wall thickening, or biliary dilatation. Pancreas: Unremarkable Spleen: Unremarkable Adrenals/Urinary Tract: Adrenal glands are unremarkable. Kidneys are normal, without renal calculi, focal lesion, or hydronephrosis. Bladder is unremarkable. Stomach/Bowel: Severe descending and sigmoid colonic diverticulosis without superimposed acute inflammatory change. Stomach, small bowel, and large bowel are otherwise unremarkable. Appendix normal. No free intraperitoneal gas or fluid. No evidence of active gastrointestinal hemorrhage. Lymphatic: No pathologic adenopathy within the abdomen and pelvis. Reproductive: Prostate is unremarkable. Other: No abdominal wall hernia or abnormality. No abdominopelvic ascites. Musculoskeletal: No acute bone abnormality. 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 IMPRESSION: 1. No evidence of active gastrointestinal hemorrhage. 2. 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 This can be seen in the setting of median arcuate ligament syndrome. 3. Severe distal colonic diverticulosis without superimposed acute inflammatory change. 4. 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 Aortic Atherosclerosis (ICD10-I70.0). Electronically Signed   By: Fidela [name] M.D.   On: 04/15/2022 19:01    Medications: I have reviewed the patient's current medications.  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  Low suspicion for other colonic etiology for hematochezia, especially with 2 recent examination with CAT scan.  Recommend monitoring, H&H and transfuse if hemoglobin is less than 7.  Expectant management.  Currently on full liquid diet, okay to advance to regular diet and continue fiber supplements from GI standpoint.  Please recall GI if needed.  Ronnette Juniper, MD 04/16/2022, 3:59 PM

## 2022-04-16 NOTE — ED Notes (Signed)
ED TO INPATIENT HANDOFF REPORT  ED Nurse Name and Phone #: Altha Harm [phone]  S Name/Age/Gender Melvin Hudson 67 y.o. male Room/Bed: 025C/025C  Code Status   Code Status: Full Code  Home/SNF/Other Home Patient oriented to: self, place, time, and situation Is this baseline? Yes   Triage Complete: Triage complete  Chief Complaint GI bleed [K92.2]  Triage Note Patient bib [facility] from home with complaints of GI bleeding. Per ems patient just got discharged this morning went home had a huge bloody bowel movement. Patient did go unresponsive in hallway waiting on room. He is currently A&Ox4. BP is 95/71   Allergies Allergies  Allergen Reactions   Azilsartan Itching    Avoid ARB and ACEI per MD   Advil [Ibuprofen]     Feels "jittery"    Level of Care/Admitting Diagnosis ED Disposition     ED Disposition  Admit   Condition  --   [place]: [place] [[id number]]  Level of Care: Telemetry Medical [104]  May place patient in observation at [facility] or [place] if equivalent level of care is available:: No  Covid Evaluation: Asymptomatic - no recent exposure (last 10 days) testing not required  Diagnosis: GI bleed [id number]  Admitting Physician: Charise Killian [id number]  Attending Physician: Charise Killian [id number]          B Medical/Surgery History Past Medical History:  Diagnosis Date   Angioedema 09/17/11   tongue and lips   Arthritis    "both of my legs and feet" (01/05/2014)   CHF (congestive heart failure) ([place])    Diabetes mellitus without complication ([place])    Erectile dysfunction 04/20/2018   GI bleed    Heart murmur 04/20/2018   Hx of gout    Hypertension    Obesity    OSA on CPAP    Pure hypercholesterolemia 04/20/2018   S/P radiation therapy 07/26/2013-09/15/2013   Right tonsil/bilateral neck/ 7000 cGy   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   Past Surgical History:  Procedure Laterality Date   COLONOSCOPY  Left 04/11/2014   Procedure: COLONOSCOPY;  Surgeon: Arta Silence, MD;  Location: [facility] ENDOSCOPY;  Service: Endoscopy;  Laterality: Left;   COLONOSCOPY N/A 04/20/2014   Procedure: COLONOSCOPY;  Surgeon: Lear Ng, MD;  Location[facility] ENDOSCOPY;  Service: Endoscopy;  Laterality: N/A;   COLONOSCOPY W/ BIOPSIES AND POLYPECTOMY     benign   DIRECT LARYNGOSCOPY  01/05/2014   DIRECT LARYNGOSCOPY N/A 01/05/2014   Procedure: DIRECT LARYNGOSCOPY AND BIOPSY;  Surgeon: [name], MD;  Location: [facility];  Service: ENT;  Laterality: N/A;   KNEE ARTHROSCOPY Right ~ [place] CORONARY/GRAFT ANGIOGRAM N/A 12/31/2010   Procedure: LEFT AND RIGHT HEART CATHETERIZATION WITH Beatrix Fetters;  Surgeon: Laverda Page, MD;  Location: [facility] CATH LAB;  Service: Cardiovascular;  Laterality: N/A;   MULTIPLE EXTRACTIONS WITH ALVEOLOPLASTY N/A 07/08/2013   Procedure: Extraction of tooth #'s 22, 27 with alveoloplasty and bilateal mandibular facial exostoses reductions;  Surgeon: Lenn Cal, DDS;  Location: [facility] ORS;  Service: Oral Surgery;  Laterality: N/A;   MULTIPLE TOOTH EXTRACTIONS  ~ 2008     A IV Location/Drains/Wounds Patient Lines/Drains/Airways Status     Active Line/Drains/Airways     Name Placement date Placement time Site Days   Peripheral IV 04/15/22 18 G Anterior;Distal;Left;Upper Arm 04/15/22  1622  Arm  1   Peripheral IV 04/15/22 20 G Right Antecubital 04/15/22  1614  Antecubital  1            Intake/Output Last 24 hours  Intake/Output Summary (Last 24 hours) at 04/16/2022 1043 Last data filed at 04/16/2022 0522 Gross per 24 hour  Intake 1712.9 ml  Output 850 ml  Net 862.9 ml    Labs/Imaging Results for orders placed or performed during the hospital encounter of 04/15/22 (from the past 48 hour(s))  I-stat chem 8, ED (not at [facility], [facility] or [facility])     Status: Abnormal   Collection Time: 04/15/22  4:19 PM  Result Value Ref Range    Sodium 138 135 - 145 mmol/L   Potassium 3.7 3.5 - 5.1 mmol/L   Chloride 107 98 - 111 mmol/L   BUN 13 8 - 23 mg/dL   Creatinine, Ser 1.30 (H) 0.61 - 1.24 mg/dL   Glucose, Bld 172 (H) 70 - 99 mg/dL    Comment: Glucose reference range applies only to samples taken after fasting for at least 8 hours.   Calcium, Ion 0.89 (LL) 1.15 - 1.40 mmol/L   TCO2 23 22 - 32 mmol/L   Hemoglobin 7.8 (L) 13.0 - 17.0 g/dL   HCT 23.0 (L) 39.0 - 52.0 %   Comment NOTIFIED PHYSICIAN   Basic metabolic panel     Status: Abnormal   Collection Time: 04/15/22  4:21 PM  Result Value Ref Range   Sodium 139 135 - 145 mmol/L   Potassium 3.7 3.5 - 5.1 mmol/L   Chloride 105 98 - 111 mmol/L   CO2 24 22 - 32 mmol/L   Glucose, Bld 180 (H) 70 - 99 mg/dL    Comment: Glucose reference range applies only to samples taken after fasting for at least 8 hours.   BUN 11 8 - 23 mg/dL   Creatinine, Ser 1.31 (H) 0.61 - 1.24 mg/dL   Calcium 8.4 (L) 8.9 - 10.3 mg/dL   GFR, Estimated 59 (L) >60 mL/min    Comment: (NOTE) Calculated using the CKD-EPI Creatinine Equation (2021)    Anion gap 10 5 - 15    Comment: Performed at [facility] [address], [place], [place] [address]  CBC with Differential     Status: Abnormal   Collection Time: 04/15/22  4:21 PM  Result Value Ref Range   WBC 8.5 4.0 - 10.5 K/uL   RBC 3.32 (L) 4.22 - 5.81 MIL/uL   Hemoglobin 8.2 (L) 13.0 - 17.0 g/dL   HCT 26.3 (L) 39.0 - 52.0 %   MCV 79.2 (L) 80.0 - 100.0 fL   MCH 24.7 (L) 26.0 - 34.0 pg   MCHC 31.2 30.0 - 36.0 g/dL   RDW 18.5 (H) 11.5 - 15.5 %   Platelets 284 150 - 400 K/uL   nRBC 2.1 (H) 0.0 - 0.2 %   Neutrophils Relative % 67 %   Neutro Abs 5.7 1.7 - 7.7 K/uL   Lymphocytes Relative 16 %   Lymphs Abs 1.3 0.7 - 4.0 K/uL   Monocytes Relative 11 %   Monocytes Absolute 1.0 0.1 - 1.0 K/uL   Eosinophils Relative 4 %   Eosinophils Absolute 0.4 0.0 - 0.5 K/uL   Basophils Relative 1 %   Basophils Absolute 0.0 0.0 - 0.1 K/uL   Immature  Granulocytes 1 %   Abs Immature Granulocytes 0.10 (H) 0.00 - 0.07 K/uL    Comment: Performed at [facility], [place] [address], [place], [place] [address]  Type and screen  Status: None   Collection Time: 04/15/22  5:19 PM  Result Value Ref Range   ABO/RH(D) O POS    Antibody Screen NEG    Sample Expiration 04/18/2022,2359    Unit Number [id number]    Blood Component Type RBC LR PHER1    Unit division 00    Status of Unit ISSUED,FINAL    Unit tag comment VERBAL ORDERS PER DR DR. YAO    Transfusion Status OK TO TRANSFUSE    Crossmatch Result COMPATIBLE    Unit Number [id number]    Blood Component Type RBC LR PHER1    Unit division 00    Status of Unit ISSUED,FINAL    Transfusion Status OK TO TRANSFUSE    Crossmatch Result Compatible    Unit Number [id number]    Blood Component Type RED CELLS,LR    Unit division 00    Status of Unit ISSUED,FINAL    Transfusion Status OK TO TRANSFUSE    Crossmatch Result Compatible   Prepare RBC (crossmatch)     Status: None   Collection Time: 04/15/22  5:19 PM  Result Value Ref Range   Order Confirmation      ORDER PROCESSED BY BLOOD BANK Performed at [facility], [place] [address], [place], [place] [id number]   Basic metabolic panel     Status: Abnormal   Collection Time: 04/16/22  4:36 AM  Result Value Ref Range   Sodium 138 135 - 145 mmol/L   Potassium 3.6 3.5 - 5.1 mmol/L   Chloride 107 98 - 111 mmol/L   CO2 27 22 - 32 mmol/L   Glucose, Bld 96 70 - 99 mg/dL    Comment: Glucose reference range applies only to samples taken after fasting for at least 8 hours.   BUN 11 8 - 23 mg/dL   Creatinine, Ser 0.98 0.61 - 1.24 mg/dL   Calcium 8.0 (L) 8.9 - 10.3 mg/dL   GFR, Estimated >60 >60 mL/min    Comment: (NOTE) Calculated using the CKD-EPI Creatinine Equation (2021)    Anion gap 4 (L) 5 - 15    Comment: Performed at [facility] [address] [place], [place] [address]  CBC     Status: Abnormal   Collection  Time: 04/16/22  4:36 AM  Result Value Ref Range   WBC 11.0 (H) 4.0 - 10.5 K/uL   RBC 3.13 (L) 4.22 - 5.81 MIL/uL   Hemoglobin 8.4 (L) 13.0 - 17.0 g/dL   HCT 25.5 (L) 39.0 - 52.0 %   MCV 81.5 80.0 - 100.0 fL   MCH 26.8 26.0 - 34.0 pg   MCHC 32.9 30.0 - 36.0 g/dL   RDW 18.6 (H) 11.5 - 15.5 %   Platelets 196 150 - 400 K/uL   nRBC 0.8 (H) 0.0 - 0.2 %    Comment: Performed at [facility] [address], [place], [place] [address]  Protime-INR     Status: Abnormal   Collection Time: 04/16/22  4:36 AM  Result Value Ref Range   Prothrombin Time 16.1 (H) 11.4 - 15.2 seconds   INR 1.3 (H) 0.8 - 1.2    Comment: (NOTE) INR goal varies based on device and disease states. Performed at [facility], [place] [address], [place],  [address]   Magnesium     Status: None   Collection Time: 04/16/22  4:36 AM  Result Value Ref Range   Magnesium 2.0 1.7 - 2.4 mg/dL  Comment: Performed at [facility], [place] [address], [place], [place] [address]  Provider-confirm verbal Blood Bank order - RBC; 1 Unit; Order taken: 04/15/2022; 4:31 PM; Emergency Release     Status: None   Collection Time: 04/16/22  9:07 AM  Result Value Ref Range   Blood product order confirm      MD AUTHORIZATION REQUESTED DR Darl Householder Performed at [facility] [address], [place], [place] [address]    CT Angio Abd/Pel W and/or Wo Contrast  Result Date: 04/15/2022 CLINICAL DATA:  Lower gastrointestinal hemorrhage EXAM: CTA ABDOMEN AND PELVIS WITHOUT AND WITH CONTRAST TECHNIQUE: Multidetector CT imaging of the abdomen and pelvis was performed using the standard protocol during bolus administration of intravenous contrast. Multiplanar reconstructed images and MIPs were obtained and reviewed to evaluate the vascular anatomy. RADIATION DOSE REDUCTION: This exam was performed according to the departmental dose-optimization program which includes automated exposure control, adjustment of the mA and/or kV  according to patient size and/or use of iterative reconstruction technique. CONTRAST:  19m OMNIPAQUE IOHEXOL 350 MG/ML SOLN COMPARISON:  None Available. FINDINGS: VASCULAR Aorta: Normal caliber aorta without aneurysm, dissection, vasculitis or significant stenosis. 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 Distally widely patent. SMA: Patent without evidence of aneurysm, dissection, vasculitis or significant stenosis. Renals: Both renal arteries are patent without evidence of aneurysm, dissection, vasculitis, fibromuscular dysplasia or significant stenosis. IMA: Patent without evidence of aneurysm, dissection, vasculitis or significant stenosis. Inflow: Patent without evidence of aneurysm, dissection, vasculitis or significant stenosis. Mild atherosclerotic calcification. Internal iliac arteries are patent bilaterally. Proximal Outflow: Bilateral common femoral and visualized portions of the superficial and profunda femoral arteries are patent without evidence of aneurysm, dissection, vasculitis or significant stenosis. Veins: Unremarkable Review of the MIP images confirms the above findings. NON-VASCULAR Lower chest: No acute abnormality. Hepatobiliary: No focal liver abnormality is seen. No gallstones, gallbladder wall thickening, or biliary dilatation. Pancreas: Unremarkable Spleen: Unremarkable Adrenals/Urinary Tract: Adrenal glands are unremarkable. Kidneys are normal, without renal calculi, focal lesion, or hydronephrosis. Bladder is unremarkable. Stomach/Bowel: Severe descending and sigmoid colonic diverticulosis without superimposed acute inflammatory change. Stomach, small bowel, and large bowel are otherwise unremarkable. Appendix normal. No free intraperitoneal gas or fluid. No evidence of active gastrointestinal hemorrhage. Lymphatic: No pathologic adenopathy within the abdomen and  pelvis. Reproductive: Prostate is unremarkable. Other: No abdominal wall hernia or abnormality. No abdominopelvic ascites. Musculoskeletal: No acute bone abnormality. 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 IMPRESSION: 1. No evidence of active gastrointestinal hemorrhage. 2. 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 This can be seen in the setting of median arcuate ligament syndrome. 3. Severe distal colonic diverticulosis without superimposed acute inflammatory change. 4. 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 Aortic Atherosclerosis (ICD10-I70.0). Electronically Signed   By: AFidela SalisburyM.D.   On: 04/15/2022 19:01    Pending Labs Unresulted Labs (From admission, onward)     Start     Ordered   04/16/22 1200  Hemoglobin and hematocrit, blood  Once,   R        04/16/22 0653   04/16/22 1028  Parathyroid hormone, intact (no Ca)  Once,   R        04/16/22 1027   04/16/22 1028  Phosphorus  Once,   R  04/16/22 1027   04/16/22 1028  VITAMIN D 25 Hydroxy (Vit-D Deficiency, Fractures)  Once,   R        04/16/22 1027   04/16/22 1028  Alkaline phosphatase  Once,   R        04/16/22 1027            Vitals/Pain Today's Vitals   04/16/22 0900 04/16/22 0930 04/16/22 0933 04/16/22 0935  BP: 103/67 109/66    Pulse: 71 71    Resp: (!) 21 17    Temp:   98.1 F (36.7 C)   TempSrc:   Oral   SpO2: 100% 93%    PainSc:    6     Isolation Precautions No active isolations  Medications Medications  0.9 %  sodium chloride infusion (Manually program via Guardrails IV Fluids) ( Intravenous Not Given 04/15/22 2251)  0.9 %  sodium chloride infusion (Manually program via Guardrails IV Fluids) ( Intravenous Not Given 04/16/22 0027)  acetaminophen (TYLENOL) tablet 650 mg (has no administration in time range)    Or   acetaminophen (TYLENOL) suppository 650 mg (has no administration in time range)  clonazepam (KLONOPIN) disintegrating tablet 1 mg (has no administration in time range)  allopurinol (ZYLOPRIM) tablet 300 mg (300 mg Oral Given 04/16/22 0934)  atorvastatin (LIPITOR) tablet 40 mg (has no administration in time range)  gabapentin (NEURONTIN) capsule 300 mg (300 mg Oral Given 04/16/22 0934)  pantoprazole (PROTONIX) EC tablet 40 mg (40 mg Oral Given 04/16/22 0934)  psyllium (HYDROCIL/METAMUCIL) 1 packet (1 packet Oral Given 04/16/22 1033)  oxyCODONE-acetaminophen (PERCOCET/ROXICET) 5-325 MG per tablet 1 tablet (has no administration in time range)    And  oxyCODONE (Oxy IR/ROXICODONE) immediate release tablet 5 mg (5 mg Oral Given 04/16/22 0936)  sodium chloride 0.9 % bolus 1,000 mL (0 mLs Intravenous Stopped 04/15/22 1900)  sodium chloride 0.9 % bolus 1,000 mL (0 mLs Intravenous Stopped 04/15/22 1900)  iohexol (OMNIPAQUE) 350 MG/ML injection 100 mL (100 mLs Intravenous Contrast Given 04/15/22 1839)  lactated ringers bolus 1,000 mL (0 mLs Intravenous Stopped 04/16/22 0231)    Mobility walks with device     Focused Assessments    R Recommendations: See Admitting Provider Note  Report given to:   Additional Notes: Repeat H&H due at noon

## 2022-04-16 NOTE — Progress Notes (Signed)
RT placed pt on CPAP, pt tolerating well with stable v/s.

## 2022-04-16 NOTE — ED Notes (Signed)
Respiratory called regarding cpap machine.

## 2022-04-16 NOTE — ED Notes (Signed)
Pt reports he has not had a bloody BM since arriving to the ED.

## 2022-04-16 NOTE — Progress Notes (Signed)
HD#0 Subjective:   Summary: Melvin Hudson is a 67 y.o. 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  Overnight Events: None  Patient is doing well this morning without any bowel movements overnight or fecal urgency.  He states his syncopal episodes were when he was in the bed before being put in a room in the ED.  He is not have any abdominal pain but did have some mild back pain this morning that has resolved.  Objective:  Vital signs in last 24 hours: Vitals:   04/16/22 1000 04/16/22 1030 04/16/22 1209 04/16/22 1211  BP: 107/83 (!) 128/94  128/69  Pulse: 74 72  64  Resp: (!) '22 12  19  '$ Temp:    97.7 F (36.5 C)  TempSrc:    Oral  SpO2: 100% 100%  100%  Weight:   135.5 kg   Height:   '6\' 2"'$  (1.88 m)    Supplemental O2: Room Air SpO2: 100 %   Physical Exam:  Constitutional: Elderly male sitting in bed. In no acute distress. Cardio:Regular rate and rhythm Pulm:Clear to auscultation bilaterally. Normal work of breathing on room air. Abdomen: Soft, non-tender, non-distended, positive bowel sounds. [id number] for extremity edema. Skin:Warm and dry.  No pallor. Neuro:Alert and oriented x3. No focal deficit noted.  Filed Weights   04/16/22 1209  Weight: 135.5 kg       Intake/Output Summary (Last 24 hours) at 04/16/2022 1332 Last data filed at 04/16/2022 1230 Gross per 24 hour  Intake 1712.9 ml  Output 850 ml  Net 862.9 ml   Net IO Since Admission: 862.9 mL [04/16/22 1332]  Pertinent Labs:    Latest Ref Rng & Units 04/16/2022   11:55 AM 04/16/2022    4:36 AM 04/15/2022    4:21 PM  CBC  WBC 4.0 - 10.5 K/uL  11.0  8.5   Hemoglobin 13.0 - 17.0 g/dL 9.0  8.4  8.2    Hematocrit 39.0 - 52.0 % 26.1  25.5  26.3   Platelets 150 - 400 K/uL  196  284        Latest Ref Rng & Units 04/16/2022   11:55 AM 04/16/2022    4:36 AM 04/15/2022    4:21 PM  CMP  Glucose 70 - 99 mg/dL  96  180   BUN 8 - 23 mg/dL  11  11   Creatinine 0.61 - 1.24 mg/dL  0.98  1.31   Sodium 135 - 145 mmol/L  138  139   Potassium 3.5 - 5.1 mmol/L  3.6  3.7   Chloride 98 - 111 mmol/L  107  105   CO2 22 - 32 mmol/L  27  24   Calcium 8.9 - 10.3 mg/dL  8.0  8.4   Alkaline Phos 38 - 126 U/L 91       Assessment/Plan:   Principal Problem:   GI bleed Active Problems:   HTN (hypertension)   Gout   OSA (obstructive sleep apnea)   Chronic pain syndrome   Anxiety state   AKI (acute kidney injury) (HCC)   Chronic diastolic heart failure (HCC)   Syncope   Nonspecific abnormal electrocardiogram (ECG) (EKG)   Patient Summary: Melvin Hudson is a 67  y.o. 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  He has not had any more bloody bowel movements overnight.  GI do not plan for colonoscopy since he is not bleeding and stable.  Hemoglobin stable at 8.4-9 this morning.  Blood pressures are stable but still borderline low.  Orthostatics were positive from sitting to standing without symptoms. - Full liquid diet this morning and advance as tolerated - Metamucil - Will need colonoscopy in 2026  Syncope Patient 2 episodes of syncope which he describes more like just falling asleep while in the bed before getting a room in the ED.  They are preceded  by some sweating.  Low suspicion for cardiac syncope, seizure, or neurologic.  Consistent with with orthostatic/vasovagal secondary to hemorrhage and hypotension. - Orthostatics positive without symptoms, will repeat later today or tomorrow morning   Iron deficiency anemia Recent Iron 27 with saturation of 9 and ferritin level of 10.  Iron deficit of 1500, 1 unit of blood is 200 mg of iron. Received 3 units of pRBCs this admission 1 unit last admission.  Also got IV iron 250 mg twice this week. - IV iron 250 mg once today and tomorrow, plan on oral iron replacement outpatient   Hypertension HFpEF Last echo from 03/28/2022 with LVEF 50 to 55% with mild eccentric LVH. Medications include BiDil, carvedilol, enalapril, eplerenone, nifedipine, Farxiga, and atorvastatin.  We will continue to hold his medications and add them back as his pressure tolerates. - Hold home medications and add back as indicated   Type 2 diabetes A1c 4.9.  Patient takes metformin 500 mg twice daily, Farxiga 10 mg daily, and Ozempic 0.5 mg weekly at home. - Resume metformin 500 mg twice daily and Farxiga 10 mg daily   History of squamous cell tonsillar cancer s/p radiation in 2016 Patient notes that he has stopped following up with his oncologist due to stability after radiation treatment.  No current signs of recurrence but will continue to follow-up with his primary care.   OSA CPAP  Anxiety Patient takes clonazepam 1 mg 3 times daily as needed at home and will resume this.  Diet:  ADAT to regular IVF: None, VTE: None Code: Full PT/OT recs: None, .   Dispo: Anticipated discharge to Home in 1 days pending stability.   Johny Blamer, DO Internal Medicine Resident PGY-1 Pager: [phone]  Please contact the on call pager after 5 pm and on weekends at [phone].

## 2022-04-16 NOTE — ED Notes (Signed)
Pt repositioned to sit on the side of his bed.  NAD noted.

## 2022-04-17 DIAGNOSIS — K5731 Diverticulosis of large intestine without perforation or abscess with bleeding: Secondary | ICD-10-CM

## 2022-04-17 LAB — CBC
HCT: 22.7 % — ABNORMAL LOW (ref 39.0–52.0)
Hemoglobin: 7.5 g/dL — ABNORMAL LOW (ref 13.0–17.0)
MCH: 26.6 pg (ref 26.0–34.0)
MCHC: 33 g/dL (ref 30.0–36.0)
MCV: 80.5 fL (ref 80.0–100.0)
Platelets: 177 10*3/uL (ref 150–400)
RBC: 2.82 MIL/uL — ABNORMAL LOW (ref 4.22–5.81)
RDW: 19.6 % — ABNORMAL HIGH (ref 11.5–15.5)
WBC: 8.8 10*3/uL (ref 4.0–10.5)
nRBC: 1.5 % — ABNORMAL HIGH (ref 0.0–0.2)

## 2022-04-17 LAB — BASIC METABOLIC PANEL
Anion gap: 8 (ref 5–15)
BUN: 10 mg/dL (ref 8–23)
CO2: 24 mmol/L (ref 22–32)
Calcium: 8.2 mg/dL — ABNORMAL LOW (ref 8.9–10.3)
Chloride: 106 mmol/L (ref 98–111)
Creatinine, Ser: 1.07 mg/dL (ref 0.61–1.24)
GFR, Estimated: >60 mL/min (ref >60)
Glucose, Bld: 103 mg/dL — ABNORMAL HIGH (ref 70–99)
Potassium: 3.7 mmol/L (ref 3.5–5.1)
Sodium: 138 mmol/L (ref 135–145)

## 2022-04-17 LAB — GLUCOSE, CAPILLARY: Glucose-Capillary: 153 mg/dL — ABNORMAL HIGH (ref 70–99)

## 2022-04-17 LAB — PARATHYROID HORMONE, INTACT (NO CA): PTH: 45 pg/mL (ref 15–65)

## 2022-04-17 MED ORDER — SENNOSIDES-DOCUSATE SODIUM 8.6-50 MG PO TABS
1.0000 | ORAL_TABLET | Freq: Two times a day (BID) | ORAL | Status: DC
  Administered 2022-04-17 (×2): 1 via ORAL
  Filled 2022-04-17 (×2): qty 1

## 2022-04-17 MED ORDER — SODIUM CHLORIDE 0.9 % IV SOLN
250.0000 mg | Freq: Every day | INTRAVENOUS | Status: AC
  Administered 2022-04-17 – 2022-04-18 (×2): 250 mg via INTRAVENOUS
  Filled 2022-04-17: qty 20

## 2022-04-17 MED ORDER — CALCIUM CARBONATE ANTACID 500 MG PO CHEW
400.0000 mg | CHEWABLE_TABLET | Freq: Three times a day (TID) | ORAL | Status: DC
  Administered 2022-04-17 (×2): 400 mg via ORAL
  Filled 2022-04-17 (×3): qty 2

## 2022-04-17 MED ORDER — POLYETHYLENE GLYCOL 3350 17 G PO PACK
17.0000 g | PACK | Freq: Every day | ORAL | Status: DC
  Administered 2022-04-17: 17 g via ORAL
  Filled 2022-04-17: qty 1

## 2022-04-17 NOTE — Progress Notes (Addendum)
HD#1 Subjective:   Summary: Melvin Hudson is a 67 y.o. 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  Overnight Events: None  Patient is doing well this morning without any bowel movements overnight or fecal urgency.  He denies any dizziness when getting up.  He is worried about bleeding again but has not had any since admission.  Objective:  Vital signs in last 24 hours: Vitals:   04/16/22 1211 04/16/22 2039 04/16/22 2107 04/17/22 0451  BP: 128/69 (!) 109/59  131/69  Pulse: 64 72 72 70  Resp: '19 20 19 20  '$ Temp: 97.7 F (36.5 C) 98.2 F (36.8 C)  97.7 F (36.5 C)  TempSrc: Oral Oral  Oral  SpO2: 100% 99% 100% 100%  Weight:      Height:       Supplemental O2: Room Air SpO2: 100 %   Physical Exam:  Constitutional: Elderly male sitting in bed. In no acute distress. Cardio:Regular rate and rhythm Pulm: Normal work of breathing on room air. Abdomen: Soft, non-tender, non-distended, positive bowel sounds. [id number] for extremity edema. Skin:Warm and dry.  No pallor. Neuro:Alert and oriented x3. No focal deficit noted.  Filed Weights   04/16/22 1209  Weight: 135.5 kg       Intake/Output Summary (Last 24 hours) at 04/17/2022 1041 Last data filed at 04/17/2022 0900 Gross per 24 hour  Intake 1712.67 ml  Output 200 ml  Net 1512.67 ml   Net IO Since Admission: 2,375.57 mL [04/17/22 1041]  Pertinent Labs:    Latest Ref Rng & Units 04/17/2022    4:34 AM 04/16/2022   11:55 AM 04/16/2022    4:36 AM  CBC  WBC 4.0 - 10.5 K/uL 8.8   11.0   Hemoglobin 13.0 - 17.0 g/dL 7.5  9.0  8.4   Hematocrit 39.0 - 52.0 % 22.7  26.1  25.5   Platelets 150 - 400 K/uL 177   196         Latest Ref Rng & Units 04/16/2022   11:55 AM 04/16/2022    4:36 AM 04/15/2022    4:21 PM  CMP  Glucose 70 - 99 mg/dL  96  180   BUN 8 - 23 mg/dL  11  11   Creatinine 0.61 - 1.24 mg/dL  0.98  1.31   Sodium 135 - 145 mmol/L  138  139   Potassium 3.5 - 5.1 mmol/L  3.6  3.7   Chloride 98 - 111 mmol/L  107  105   CO2 22 - 32 mmol/L  27  24   Calcium 8.9 - 10.3 mg/dL  8.0  8.4   Alkaline Phos 38 - 126 U/L 91       Assessment/Plan:   Principal Problem:   GI bleed Active Problems:   HTN (hypertension)   Gout   OSA (obstructive sleep apnea)   Chronic pain syndrome   Anxiety state   AKI (acute kidney injury) (HCC)   Chronic diastolic heart failure (HCC)   Syncope   Nonspecific abnormal electrocardiogram (ECG) (EKG)   Patient Summary: Melvin Hudson is a 67 y.o. male with pertinent PMH of diverticulosis with prior bleed in 2016, hypertension, T2DM, history of squamous cell tonsillar cancer in 2015  s/p radiation completed in 2016, HFpEF, former tobacco use disorder, and OSA who presents with hematochezia after being discharged for LGIB the same morning and is admitted for recurrent LGIB likely diverticular, on hospital day 1   GI bleed, recurrent, most likely diverticular Severe colonic diverticulosis He has not had any more bowel movements overnight.  Hemoglobin stable at 7.5 this morning. GI do not plan for colonoscopy since he is not bleeding and stable. Orthostatics were positive from sitting to standing without symptoms.  We would like to see him have a bowel movement today to ensure no further bleeding and if stable tomorrow we can again plan for discharge with close outpatient follow-up and strict return precautions. - Metamucil, MiraLAX, Senokot - Will need colonoscopy in 2026  Syncope, likely vasovagal No more syncope and no presyncope or dizziness with orthostatics. - Orthostatics positive without symptoms, will repeat later today or tomorrow morning   Iron  deficiency anemia Recent Iron 27 with saturation of 9 and ferritin level of 10.  Iron deficit of 1500, 1 unit of blood is 200 mg of iron. Received 3 units of pRBCs this admission 1 unit last admission.  Also got IV iron 250 mg twice this week. - IV iron 250 mg daily while here for a total of 3 doses here after 2 last admission, plan on oral iron replacement outpatient  Hypocalcemia Presented with low calcium of 8 which corrected to 8.8.  Confirmed with ionized calcium that was low at 0.87.  This level was taken prior to blood transfusions.  Workup so far showing super therapeutic vitamin D, normal alk phos, normal phosphate, and PTH is pending.  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 - Calcium carbonate 400 mg elemental calcium 3 times daily   Hypertension HFpEF Last echo from 03/28/2022 with LVEF 50 to 55% with mild eccentric LVH. Medications include BiDil, carvedilol, enalapril, eplerenone, nifedipine, Farxiga, and atorvastatin.  We will continue to hold his medications and add them back as his pressure tolerates. - Hold home medications and add back as indicated   Type 2 diabetes A1c 4.9.  Patient takes metformin 500 mg twice daily, Farxiga 10 mg daily, and Ozempic 0.5 mg weekly at home. - Continue metformin 500 mg twice daily and Farxiga 10 mg daily   History of squamous cell tonsillar cancer s/p radiation in 2016 Patient notes that he has stopped following up with his oncologist due to stability after radiation treatment.  No current signs of recurrence but will continue to follow-up with his primary care.   OSA CPAP  Anxiety Patient takes clonazepam 1 mg 3 times daily as needed at home and will resume this.  Diet:  ADAT to regular IVF: None, VTE: None Code: Full PT/OT recs: None, .   Dispo: Anticipated discharge to Home in 1 days pending stability.   Johny Blamer, DO Internal Medicine Resident  PGY-1 Pager: [phone]  Please contact the on call pager after 5 pm and on weekends at [phone].

## 2022-04-17 NOTE — Discharge Instructions (Addendum)
[name],   You were recently admitted to [facility] for a lower gastrointestinal bleed most likely from a diverticulum that fortunately resolved on its own.    Continue taking your home medications with the following changes   Start taking Ferrous sulfate 325 mg every Monday, Wednesday, and Friday Metamucil daily MiraLAX daily Senokot 1 pill twice daily Calcium carbonate 1000 mg 3 times a day with meals Stop taking  Aspirin Vitamin D weekly supplement, your vitamin D level is above normal Do not resume until you talk to your primary care doctor BiDil (isosorbide dinitrate/hydralazine) Carvedilol Enalapril Eplerenone Nifedipine     You should seek further medical care if you notice any more bloody bowel movements, have any abdominal pain, feels severely fatigued or weak, or any other bothersome symptoms.   We recommend that you see your primary care doctor in about a week to make sure that you continue to improve. We are so glad that you are feeling better.   Sincerely, Johny Blamer, DO

## 2022-04-17 NOTE — TOC Initial Note (Signed)
Transition of Care [facility]) - Initial/Assessment Note    Patient Details  Name: Melvin Hudson MRN: [id number] Date of Birth: 09-11-1955  [facility]) CM/SW Contact:    Tom-Johnson, Renea Ee, RN Phone Number: 04/17/2022, 12:56 PM  Clinical Narrative:                  CM spoke with patient at bedside about needs for post hospital transition. Admitted for GI Bleed, was discharged the day prior for same diagnosis. Hgb today is 7.5, has received 3U PRBC this admission and on IV Iron. GI following. Positive for Orthostatics.   From home alone, has nephews, nieces and a sister that are supportive with his care. His sister comes around every Sunday and assists with chores. Drives self to and from appointments.  Has a cane and CPAP from [employer].  PCP is [name], MD and uses [facility] on [address].  No TOC needs or recommendations noted at this time. CM will continue to follow as patient progresses with care towards discharge.          Expected Discharge Plan: Home/Self Care Barriers to Discharge: Continued Medical Work up   Patient Goals and CMS Choice Patient states their goals for this hospitalization and ongoing recovery are:: To return home CMS Medicare.gov Compare Post Acute Care list provided to:: Patient Choice offered to / list presented to : NA      Expected Discharge Plan and Services   Discharge Planning Services: CM Consult   Living arrangements for the past 2 months: Apartment                 DME Arranged: N/A DME Agency: NA       HH Arranged: NA [place] Agency: NA        Prior Living Arrangements/Services Living arrangements for the past 2 months: Apartment Lives with:: Self Patient language and need for interpreter reviewed:: Yes Do you feel safe going back to the place where you live?: Yes      Need for Family Participation in Patient Care: Yes (Comment) Care giver support system in place?: Yes (comment) Current  home services: DME (Cane, CPAP) Criminal Activity/Legal Involvement Pertinent to Current Situation/Hospitalization: No - Comment as needed  Activities of Daily Living Home Assistive Devices/Equipment: Blood pressure cuff, Dentures (specify type), Eyeglasses, Cane (specify quad or straight) ADL Screening (condition at time of admission) Patient's cognitive ability adequate to safely complete daily activities?: Yes Is the patient deaf or have difficulty hearing?: No Does the patient have difficulty seeing, even when wearing glasses/contacts?: No Does the patient have difficulty concentrating, remembering, or making decisions?: No Patient able to express need for assistance with ADLs?: Yes Does the patient have difficulty dressing or bathing?: No Independently performs ADLs?: Yes (appropriate for developmental age) Does the patient have difficulty walking or climbing stairs?: Yes Weakness of Legs: None Weakness of Arms/Hands: None  Permission Sought/Granted Permission sought to share information with : Case Manager, Family Supports Permission granted to share information with : Yes, Verbal Permission Granted              Emotional Assessment Appearance:: Appears stated age Attitude/Demeanor/Rapport: Engaged, Gracious Affect (typically observed): Accepting, Appropriate, Calm, Hopeful, Pleasant Orientation: : Oriented to Self, Oriented to Place, Oriented to  Time, Oriented to Situation Alcohol / Substance Use: Not Applicable Psych Involvement: No (comment)  Admission diagnosis:  Blood loss anemia [D50.0] GI bleed [K92.2] Lower GI bleed [K92.2] Syncope, unspecified syncope type [R55] Patient  Active Problem List   Diagnosis Date Noted   Hypocalcemia 04/17/2022   Anxiety state 04/15/2022   AKI (acute kidney injury) ([place]) 04/15/2022   Chronic diastolic heart failure ([place]) 04/15/2022   Syncope 04/15/2022   Nonspecific abnormal electrocardiogram (ECG) (EKG) 04/15/2022   GI bleed  04/13/2022   Encounter for dental examination 01/01/2022   Status post radiation therapy 12/05/2020   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   Edentulous 12/05/2020   Ill-fitting dentures 12/05/2020   Former smoker 11/11/2019   Healthcare maintenance 11/11/2019   Bilateral leg edema 04/21/2018   Heart murmur 04/20/2018   Erectile dysfunction 04/20/2018   Pure hypercholesterolemia 04/20/2018   Obesity, Class III, BMI 40-49.9 (morbid obesity) ([place])    BRBPR (bright red blood per rectum) 08/30/2016   Chronic pain syndrome    History of GI diverticular bleed 04/18/2014   Hematochezia 04/10/2014   Symptomatic anemia 04/08/2014   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   Mouth pain 07/18/2013   Weight loss 07/18/2013   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[place]) 06/29/2013   OSA (obstructive sleep apnea) 11/19/2011   Angio-edema 09/17/2011   HTN (hypertension) 09/17/2011   Gout 09/17/2011   PCP:  [name], MD Pharmacy:   [facility] [address] [place] [address] Phone: [phone] Fax: [phone]  [facility], [place] [address] Phone: [phone] Fax: [place] [phone] [place], [place] [facility] [place] [facility] [address] [place] [address] Phone: [phone] Fax: [phone]  [facility] [address] [address] Phone: [phone] Fax: [phone]     Social Determinants of Health (SDOH) Social History: SDOH Screenings   Food Insecurity: No Food Insecurity (04/16/2022)  Housing: Low Risk  (04/16/2022)  Transportation Needs: No Transportation Needs (04/16/2022)  Utilities: Not At Risk (04/16/2022)  Tobacco Use: Medium Risk (04/16/2022)   SDOH Interventions: Transportation Interventions: Intervention Not Indicated,  Inpatient TOC, Patient Resources (Friends/Family)   Readmission Risk Interventions     No data to display

## 2022-04-18 DIAGNOSIS — K5731 Diverticulosis of large intestine without perforation or abscess with bleeding: Secondary | ICD-10-CM | POA: Diagnosis not present

## 2022-04-18 LAB — BASIC METABOLIC PANEL
Anion gap: 10 (ref 5–15)
BUN: 5 mg/dL — ABNORMAL LOW (ref 8–23)
CO2: 24 mmol/L (ref 22–32)
Calcium: 8.3 mg/dL — ABNORMAL LOW (ref 8.9–10.3)
Chloride: 105 mmol/L (ref 98–111)
Creatinine, Ser: 1.14 mg/dL (ref 0.61–1.24)
GFR, Estimated: >60 mL/min (ref >60)
Glucose, Bld: 135 mg/dL — ABNORMAL HIGH (ref 70–99)
Potassium: 3.5 mmol/L (ref 3.5–5.1)
Sodium: 139 mmol/L (ref 135–145)

## 2022-04-18 LAB — CBC
HCT: 26.4 % — ABNORMAL LOW (ref 39.0–52.0)
Hemoglobin: 8.3 g/dL — ABNORMAL LOW (ref 13.0–17.0)
MCH: 26.3 pg (ref 26.0–34.0)
MCHC: 31.4 g/dL (ref 30.0–36.0)
MCV: 83.8 fL (ref 80.0–100.0)
Platelets: 224 10*3/uL (ref 150–400)
RBC: 3.15 MIL/uL — ABNORMAL LOW (ref 4.22–5.81)
RDW: 21.3 % — ABNORMAL HIGH (ref 11.5–15.5)
WBC: 9.8 10*3/uL (ref 4.0–10.5)
nRBC: 1.5 % — ABNORMAL HIGH (ref 0.0–0.2)

## 2022-04-18 LAB — HEMOGLOBIN AND HEMATOCRIT, BLOOD
HCT: 24.4 % — ABNORMAL LOW (ref 39.0–52.0)
Hemoglobin: 8.3 g/dL — ABNORMAL LOW (ref 13.0–17.0)

## 2022-04-18 LAB — GLUCOSE, CAPILLARY: Glucose-Capillary: 135 mg/dL — ABNORMAL HIGH (ref 70–99)

## 2022-04-18 MED ORDER — CALCIUM CARBONATE ANTACID 500 MG PO CHEW: 400.0000 mg | CHEWABLE_TABLET | Freq: Three times a day (TID) | ORAL | 1 refills | Status: AC

## 2022-04-18 MED ORDER — POLYETHYLENE GLYCOL 3350 17 G PO PACK: 17.0000 g | PACK | Freq: Every day | ORAL | 0 refills | Status: AC

## 2022-04-18 MED ORDER — SENNOSIDES-DOCUSATE SODIUM 8.6-50 MG PO TABS: 1.0000 | ORAL_TABLET | Freq: Two times a day (BID) | ORAL | 1 refills | Status: DC

## 2022-04-18 NOTE — TOC Transition Note (Signed)
Transition of Care [facility]) - CM/SW Discharge Note   Patient Details  Name: Melvin Hudson MRN: [id number] Date of Birth: [birth year]  [facility] CM/SW Contact:  Pollie Friar, RN Phone Number: 04/18/2022, 4:47 PM   Clinical Narrative:    Pt is discharging home with self care. No needs per TOC.   Final next level of care: Home/Self Care Barriers to Discharge: No Barriers Identified   Patient Goals and CMS Choice CMS Medicare.gov Compare Post Acute Care list provided to:: Patient Choice offered to / list presented to : NA  Discharge Placement                         Discharge Plan and Services Additional resources added to the After Visit Summary for     Discharge Planning Services: CM Consult            DME Arranged: N/A DME Agency: NA       HH Arranged: NA HH Agency: NA        Social Determinants of Health (SDOH) Interventions SDOH Screenings   Food Insecurity: No Food Insecurity (04/16/2022)  Housing: Low Risk  (04/16/2022)  Transportation Needs: No Transportation Needs (04/16/2022)  Utilities: Not At Risk (04/16/2022)  Tobacco Use: Medium Risk (04/16/2022)     Readmission Risk Interventions     No data to display

## 2022-04-18 NOTE — Care Management Important Message (Signed)
Important Message  Patient Details  Name: Melvin Hudson MRN: [id number] Date of Birth: 08-12-1955   Medicare Important Message Given:  Yes     Eisha Chatterjee Montine Circle 04/18/2022, 3:05 PM

## 2022-04-18 NOTE — Progress Notes (Signed)
Mobility Specialist Progress Note   04/18/22 1207  Mobility  Activity Ambulated independently in room  Level of Assistance Modified independent, requires aide device or extra time  Assistive Device Cane  Distance Ambulated (ft) 1000 ft  Activity Response Tolerated well  Mobility Referral Yes  $Mobility charge 1 Mobility   Received pt in doorway having no complaints and agreeable to mobility. Pt was asymptomatic throughout ambulation and returned to room w/o fault. Left in chair w/ call bell in reach and all needs met.  Holland Falling Mobility Specialist Please contact via SecureChat or  Rehab office at [phone]

## 2022-04-18 NOTE — Discharge Summary (Addendum)
Name: Melvin Hudson MRN: [id number] DOB: January 01, 1956 67 y.o. PCP: [name], MD  Date of Admission: 04/15/2022  3:46 PM Date of Discharge: 04/18/2022 Attending Physician: Dr.  Saverio Danker  Discharge Diagnosis: Principal Problem:   GI bleed Active Problems:   HTN (hypertension)   Gout   OSA (obstructive sleep apnea)   Chronic pain syndrome   Anxiety state   AKI (acute kidney injury) ([place])   Chronic diastolic heart failure (HCC)   Syncope   Nonspecific abnormal electrocardiogram (ECG) (EKG)   Hypocalcemia    Discharge Medications: Allergies as of 04/18/2022       Reactions   Azilsartan Itching   Avoid ARB and ACEI per MD   Advil [ibuprofen]    Feels "jittery"        Medication List     STOP taking these medications    aspirin EC 81 MG tablet   carvedilol 25 MG tablet Commonly known as: COREG   enalapril 2.5 MG tablet Commonly known as: VASOTEC   eplerenone 50 MG tablet Commonly known as: INSPRA   isosorbide-hydrALAZINE 20-37.5 MG tablet Commonly known as: BIDIL   NIFEdipine 90 MG 24 hr tablet Commonly known as: ADALAT CC   Vitamin D (Ergocalciferol) 1.25 MG (50000 UNIT) Caps capsule Commonly known as: DRISDOL       TAKE these medications    allopurinol 300 MG tablet Commonly known as: ZYLOPRIM Take 300 mg by mouth daily.   atorvastatin 40 MG tablet Commonly known as: LIPITOR Take 1 tablet (40 mg total) by mouth at bedtime.   calcium carbonate 500 MG chewable tablet Commonly known as: TUMS - dosed in mg elemental calcium Chew 2 tablets (400 mg of elemental calcium total) by mouth 3 (three) times daily with meals.   calcium citrate-vitamin D 500-500 MG-UNIT chewable tablet Chew 0.5-1 tablets by mouth daily. Take 1 tablet by mouth in the morning and a half tablet at bedtime.   clonazePAM 1 MG disintegrating tablet Commonly known as: KLONOPIN Take 1 mg by mouth 3 (three) times daily as needed (Anxiety).   diphenhydrAMINE 25 MG tablet Commonly  known as: BENADRYL Take 1 tablet (25 mg total) by mouth every 6 (six) hours. What changed:  when to take this reasons to take this   Farxiga 10 MG Tabs tablet Generic drug: dapagliflozin propanediol Take 1 tablet (10 mg total) by mouth every morning.   FeroSul 325 (65 FE) MG tablet Generic drug: ferrous sulfate Take 1 tablet (325 mg total) by mouth every Monday, Wednesday, and Friday.   gabapentin 300 MG capsule Commonly known as: NEURONTIN Take 300 mg by mouth 3 (three) times daily.   metFORMIN 500 MG tablet Commonly known as: GLUCOPHAGE Take 500 mg by mouth 2 (two) times daily with a meal.   Narcan 4 MG/0.1ML Liqd nasal spray kit Generic drug: naloxone Place 1 spray into the nose once as needed (Overdose).   omeprazole 20 MG capsule Commonly known as: PRILOSEC Take 20 mg by mouth daily.   OneTouch Verio Flex System w/Device Kit USE TO CHECK BLOOD SUGAR TWICE DAILY   OneTouch Verio test strip Generic drug: glucose blood USE TO CHECK BLOOD SUGAR TWICE DAILY   oxyCODONE-acetaminophen 10-325 MG tablet Commonly known as: PERCOCET Take 1 tablet by mouth every 6 (six) hours as needed for pain. What changed: when to take this   Ozempic (0.25 or 0.5 MG/DOSE) 2 MG/1.5ML Sopn Generic drug: Semaglutide(0.25 or 0.'5MG'$ /DOS) Inject 0.25 mg into the skin once a week. Sunday's  polyethylene glycol 17 g packet Commonly known as: MIRALAX / GLYCOLAX Take 17 g by mouth daily. Start taking on: April 19, 2022   psyllium 95 % Pack Commonly known as: HYDROCIL/METAMUCIL Take 1 packet by mouth daily.   senna-docusate 8.6-50 MG tablet Commonly known as: Senokot-S Take 1 tablet by mouth 2 (two) times daily.        Disposition and follow-up:   Melvin Hudson was discharged from [facility] in Good condition.  At the hospital follow up visit please address:  1.  Follow-up:             a.  Please follow-up on hemoglobin levels to ensure that he is not still  losing blood.  Also ensure that he may resume his antihypertensives and heart failure medications.  They were held on discharge due to normotension on discharge w/ hypotension on admission.                          b.  Reevaluate the need for metformin with A1c of 4.9.   C. Recheck calcium levels and need for continued calcium supplementation.   2.  Labs / imaging needed at time of follow-up: CBC, Ca   3.  Pending labs/ test needing follow-up: None  Follow-up Appointments: Patient states he has a follow-up with his PCP on 04/21/2022.  Hospital Course by problem list: Thuan Sheldrick is a 67 y.o. 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   Recurrent Diverticular Bleed Severe colonic diverticulosis Presented again same day of discharge with painless hematochezia with no active extravasation on CTA of the abdomen pelvis.  Hemoglobin was 8.2 on presentation to the ED which is down from 8.4 from discharge earlier that morning which was likely a likely lab value.  He was hypotensive and tachycardic and required 3 units of PRBCs and IV fluid rehydration.  Hemoglobin dropped to 7.5 the following morning.  GI saw the patient again but without any active bleed no further intervention.  He did not have another bowel movement during admission until the morning of discharge when he had 2 bowel movements without fecal urgency that had a little bit of old blood.  Orthostatics were checked after these bowel movements and were negative.  H&H was rechecked in the afternoon with stable hemoglobin at 8.3.  He was discharged with close outpatient follow-up on 04/21/2022.  He was also instructed to continue to take Metamucil  and add MiraLAX and Senokot for soft bowel movements.  He will need a colonoscopy in 2026.   Syncope, likely vasovagal On arrival to the ED with his low blood pressures he was falling asleep or passing out on the stretcher.  No syncope with ground-level falls.  No further symptoms after receiving 3 units of blood and IV fluids he did not have any more orthostatic dizziness or syncope.  No chest pain, postictal period, bowel or bladder incontinence, or arrhythmias noted during admission or associated with his syncope.  Orthostatics were positive during admission without symptoms but on the final day orthostatics were negative.  His antihypertensives were held with instructions to discuss them with his PCP before resuming.    Iron deficiency anemia Recent Iron 27  with saturation of 9 and ferritin level of 10.  Iron deficit of 1500, 1 unit of blood is 200 mg of iron. Received 3 units of pRBCs this admission 1 unit last admission.  Also got IV iron 250 mg twice at last admission.  He got 3 more doses of IV iron 250 mg and will continue oral iron replacement outpatient.   Hypocalcemia Presented with low calcium of 8 on metabolic panel which was confirmed with ionized calcium (taken prior to blood transfusion) that was low at 0.87.  This level was taken prior to blood transfusions. 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  Workup showed supra therapeutic vitamin D, normal alk phos, normal phosphate, and normal PTH.  He was put on calcium carbonate 1000 mg (400 mg elemental calcium) 3 times daily with meals.   Hypertension HFpEF Last echo from 03/28/2022 with LVEF 50 to 55% with mild eccentric LVH. Medications include BiDil, carvedilol, enalapril, eplerenone, nifedipine, Farxiga, and atorvastatin. Antihypertensives were held with low blood pressures and he will discuss restarting that with his PCP on Monday.   Type 2 diabetes A1c 4.9. Patient  takes metformin 500 mg twice daily, Farxiga 10 mg daily, and Ozempic 0.5 mg weekly at home. Continued metformin 500 mg twice daily and Farxiga 10 mg daily.   Discharge Subjective: Patient is feeling well this morning and had 2 bowel movements that did have some blood in them but he thinks it is old blood.  He states does not look as bloody as it did before he got to the hospital.  He is not having any dizziness and is not have any fecal urgency like he does whenever he has blood in the past 2 times.  Discharge Exam:   BP (!) 151/88 (BP Location: Left Arm)   Pulse 84   Temp 98 F (36.7 C) (Oral)   Resp 17   Ht '6\' 2"'$  (1.88 m)   Wt 135.5 kg   SpO2 100%   BMI 38.35 kg/m  Constitutional: Obese elderly male sitting in bed. In no acute distress. Pulm: Normal work of breathing on room air. Abdomen: Soft, non-tender, non-distended, positive bowel sounds. [id number] for extremity edema. Skin:Warm and dry.  No pallor. Neuro:Alert and oriented x3. No focal deficit noted.  Pertinent Labs, Studies, and Procedures:     Latest Ref Rng & Units 04/18/2022    3:07 PM 04/18/2022    8:08 AM 04/17/2022    4:34 AM  CBC  WBC 4.0 - 10.5 K/uL  9.8  8.8   Hemoglobin 13.0 - 17.0 g/dL 8.3  8.3  7.5   Hematocrit 39.0 - 52.0 % 24.4  26.4  22.7   Platelets 150 - 400 K/uL  224  177        Latest Ref Rng & Units 04/18/2022    8:08 AM 04/17/2022   12:45 PM 04/16/2022   11:55 AM  CMP  Glucose 70 - 99 mg/dL 135  103    BUN 8 - 23 mg/dL 5  10    Creatinine 0.61 - 1.24 mg/dL 1.14  1.07    Sodium 135 - 145 mmol/L 139  138    Potassium 3.5 - 5.1 mmol/L 3.5  3.7    Chloride 98 - 111 mmol/L 105  106    CO2 22 - 32 mmol/L 24  24    Calcium 8.9 - 10.3 mg/dL 8.3  8.2    Alkaline  Phos 38 - 126 U/L   91     CT Angio Abd/Pel W and/or Wo Contrast  Result Date: 04/15/2022 CLINICAL DATA:  Lower gastrointestinal hemorrhage EXAM: CTA ABDOMEN AND PELVIS WITHOUT AND WITH CONTRAST TECHNIQUE: Multidetector CT imaging of the  abdomen and pelvis was performed using the standard protocol during bolus administration of intravenous contrast. Multiplanar reconstructed images and MIPs were obtained and reviewed to evaluate the vascular anatomy. RADIATION DOSE REDUCTION: This exam was performed according to the departmental dose-optimization program which includes automated exposure control, adjustment of the mA and/or kV according to patient size and/or use of iterative reconstruction technique. CONTRAST:  137m OMNIPAQUE IOHEXOL 350 MG/ML SOLN COMPARISON:  None Available. FINDINGS: VASCULAR Aorta: Normal caliber aorta without aneurysm, dissection, vasculitis or significant stenosis. 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 Distally widely patent. SMA: Patent without evidence of aneurysm, dissection, vasculitis or significant stenosis. Renals: Both renal arteries are patent without evidence of aneurysm, dissection, vasculitis, fibromuscular dysplasia or significant stenosis. IMA: Patent without evidence of aneurysm, dissection, vasculitis or significant stenosis. Inflow: Patent without evidence of aneurysm, dissection, vasculitis or significant stenosis. Mild atherosclerotic calcification. Internal iliac arteries are patent bilaterally. Proximal Outflow: Bilateral common femoral and visualized portions of the superficial and profunda femoral arteries are patent without evidence of aneurysm, dissection, vasculitis or significant stenosis. Veins: Unremarkable Review of the MIP images confirms the above findings. NON-VASCULAR Lower chest: No acute abnormality. Hepatobiliary: No focal liver abnormality is seen. No gallstones, gallbladder wall thickening, or biliary dilatation. Pancreas: Unremarkable Spleen: Unremarkable Adrenals/Urinary Tract: Adrenal glands are unremarkable. Kidneys are normal, without renal calculi, focal lesion,  or hydronephrosis. Bladder is unremarkable. Stomach/Bowel: Severe descending and sigmoid colonic diverticulosis without superimposed acute inflammatory change. Stomach, small bowel, and large bowel are otherwise unremarkable. Appendix normal. No free intraperitoneal gas or fluid. No evidence of active gastrointestinal hemorrhage. Lymphatic: No pathologic adenopathy within the abdomen and pelvis. Reproductive: Prostate is unremarkable. Other: No abdominal wall hernia or abnormality. No abdominopelvic ascites. Musculoskeletal: No acute bone abnormality. 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 IMPRESSION: 1. No evidence of active gastrointestinal hemorrhage. 2. 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 This can be seen in the setting of median arcuate ligament syndrome. 3. Severe distal colonic diverticulosis without superimposed acute inflammatory change. 4. 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 Aortic Atherosclerosis (ICD10-I70.0). Electronically Signed   By: AFidela SalisburyM.D.   On: 04/15/2022 19:01     Discharge Instructions: Discharge Instructions     Call MD for:  difficulty breathing, headache or visual disturbances   Complete by: As directed    Call MD for:  extreme fatigue   Complete by: As directed    Call MD for:  persistant dizziness or light-headedness   Complete by: As directed    Call MD for:  persistant nausea and vomiting   Complete by: As directed    Call MD for:  severe uncontrolled pain   Complete by: As directed    Diet - low sodium heart healthy   Complete by: As directed    Increase activity slowly   Complete by: As directed        Signed: GJohny Blamer DO 04/18/2022, 6:08 PM   Pager: [phone]

## 2022-06-16 ENCOUNTER — Other Ambulatory Visit (HOSPITAL_COMMUNITY): Payer: Self-pay

## 2022-08-18 ENCOUNTER — Other Ambulatory Visit: Payer: Self-pay | Admitting: Family Medicine

## 2022-10-07 ENCOUNTER — Ambulatory Visit: Payer: Medicare HMO | Admitting: Cardiology

## 2022-10-16 ENCOUNTER — Ambulatory Visit: Payer: Medicare HMO | Admitting: Cardiology

## 2022-10-16 ENCOUNTER — Encounter: Payer: Self-pay | Admitting: Cardiology

## 2022-10-16 VITALS — BP 113/68 | HR 86 | Resp 16 | Ht 74.0 in | Wt 300.0 lb

## 2022-10-16 DIAGNOSIS — E782 Mixed hyperlipidemia: Secondary | ICD-10-CM

## 2022-10-16 DIAGNOSIS — G4733 Obstructive sleep apnea (adult) (pediatric): Secondary | ICD-10-CM

## 2022-10-16 DIAGNOSIS — Z87891 Personal history of nicotine dependence: Secondary | ICD-10-CM

## 2022-10-16 DIAGNOSIS — E1165 Type 2 diabetes mellitus with hyperglycemia: Secondary | ICD-10-CM

## 2022-10-16 DIAGNOSIS — I77819 Aortic ectasia, unspecified site: Secondary | ICD-10-CM

## 2022-10-16 DIAGNOSIS — I10 Essential (primary) hypertension: Secondary | ICD-10-CM

## 2022-10-16 DIAGNOSIS — I5032 Chronic diastolic (congestive) heart failure: Secondary | ICD-10-CM

## 2022-10-16 NOTE — Progress Notes (Signed)
Melvin Hudson Date of Birth: [birth year] MRN: [id number] Primary Care Provider:Reese, Jocelyn Lamer, MD Former Cardiology Providers: Altamese Windom, APRN, FNP-C Primary Cardiologist: Tessa Lerner, DO, [facility] (established care 09/13/2019)  Date: 10/16/22 Last Office Visit: 04/08/2022  Chief Complaint  Patient presents with   Chronic heart failure with preserved ejection fraction [facility])   Follow-up    HPI  Melvin Hudson is a 67 y.o.  male whose past medical history and cardiovascular risk factors include: GI bleed (February and March 2024), hypertension, diabetes mellitus, former tobacco use, history of throat cancer diagnosis in 2015, no significant epicardial coronary disease per angiography in 2012, chronic heart failure with preserved EF, OSA on CPAP, advanced age.  Patient being followed by the practice for HFpEF and hyperlipidemia.  He presents today for 87-month follow-up visit.  Over the last 6 months patient states that he has had 2 hospitalizations for GI bleeds requiring blood transfusions.  In the interim his blood pressure medications were held due to soft blood pressures but slowly restarted by PCP.  Medications reconciled.  Clinically denies anginal chest pain or heart failure symptoms.  He is back on Ozempic and has lost 11 pounds in the last visit.  FUNCTIONAL STATUS: Walks at least 2 miles 5 days a week.  ALLERGIES: Allergies  Allergen Reactions   Azilsartan Itching    Avoid ARB and ACEI per MD   Advil [Ibuprofen]     Feels "jittery"    MEDICATION LIST PRIOR TO VISIT: Current Outpatient Medications on File Prior to Visit  Medication Sig Dispense Refill   allopurinol (ZYLOPRIM) 300 MG tablet Take 300 mg by mouth daily.     atorvastatin (LIPITOR) 40 MG tablet Take 1 tablet (40 mg total) by mouth at bedtime. 90 tablet 1   Blood Glucose Monitoring Suppl (ONETOUCH VERIO FLEX SYSTEM) w/Device KIT USE TO CHECK BLOOD SUGAR TWICE DAILY     carvedilol (COREG) 25 MG tablet Take 25  mg by mouth 2 (two) times daily with a meal.     clonazePAM (KLONOPIN) 1 MG disintegrating tablet Take 1 mg by mouth 3 (three) times daily as needed (Anxiety).     dapagliflozin propanediol (FARXIGA) 10 MG TABS tablet Take 10 mg by mouth daily.     diphenhydrAMINE (BENADRYL) 25 MG tablet Take 1 tablet (25 mg total) by mouth every 6 (six) hours. (Patient taking differently: Take 25 mg by mouth every 6 (six) hours as needed for allergies or sleep.) 20 tablet 0   ferrous sulfate 325 (65 FE) MG tablet Take 1 tablet (325 mg total) by mouth every Monday, Wednesday, and Friday. 60 tablet 1   gabapentin (NEURONTIN) 300 MG capsule Take 300 mg by mouth 3 (three) times daily.   2   isosorbide-hydrALAZINE (BIDIL) 20-37.5 MG tablet Take 1 tablet by mouth 3 (three) times daily.     metFORMIN (GLUCOPHAGE) 500 MG tablet Take 500 mg by mouth 2 (two) times daily with a meal.      NARCAN 4 MG/0.1ML LIQD nasal spray kit Place 1 spray into the nose once as needed (Overdose).     NIFEdipine (PROCARDIA XL/NIFEDICAL-XL) 90 MG 24 hr tablet Take 90 mg by mouth daily.     omeprazole (PRILOSEC) 20 MG capsule Take 20 mg by mouth daily.   4   ONETOUCH VERIO test strip USE TO CHECK BLOOD SUGAR TWICE DAILY     oxyCODONE-acetaminophen (PERCOCET) 10-325 MG per tablet Take 1 tablet by mouth every 6 (six) hours as needed for pain. (Patient  taking differently: Take 1 tablet by mouth 4 (four) times daily as needed for pain.) 120 tablet 0   OZEMPIC, 0.25 OR 0.5 MG/DOSE, 2 MG/1.5ML SOPN Inject 0.25 mg into the skin once a week. Sunday's     polyethylene glycol (MIRALAX / GLYCOLAX) 17 g packet Take 17 g by mouth daily. 14 each 0   psyllium (HYDROCIL/METAMUCIL) 95 % PACK Take 1 packet by mouth daily. 240 each 0   senna-docusate (SENOKOT-S) 8.6-50 MG tablet Take 1 tablet by mouth 2 (two) times daily. 60 tablet 1   spironolactone (ALDACTONE) 50 MG tablet Take 50 mg by mouth daily.     calcium carbonate (TUMS - DOSED IN MG ELEMENTAL CALCIUM)  500 MG chewable tablet Chew 2 tablets (400 mg of elemental calcium total) by mouth 3 (three) times daily with meals. (Patient not taking: Reported on 10/16/2022) 180 tablet 1   No current facility-administered medications on file prior to visit.    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    PAST SURGICAL HISTORY: Past Surgical History:  Procedure Laterality Date   COLONOSCOPY Left 04/11/2014   Procedure: COLONOSCOPY;  Surgeon: Willis Modena, MD;  Location: [facility] ENDOSCOPY;  Service: Endoscopy;  Laterality: Left;   COLONOSCOPY N/A 04/20/2014   Procedure: COLONOSCOPY;  Surgeon: Shirley Friar, MD;  Location: [facility] ENDOSCOPY;  Service: Endoscopy;  Laterality: N/A;   COLONOSCOPY W/ BIOPSIES AND POLYPECTOMY     benign   DIRECT LARYNGOSCOPY  01/05/2014   DIRECT LARYNGOSCOPY N/A 01/05/2014   Procedure: DIRECT LARYNGOSCOPY AND BIOPSY;  Surgeon: [name], MD;  Location: [facility] OR;  Service: ENT;  Laterality: N/A;   KNEE ARTHROSCOPY Right ~ 1994   LEFT AND RIGHT HEART CATHETERIZATION WITH CORONARY/GRAFT ANGIOGRAM N/A 12/31/2010   Procedure: LEFT AND RIGHT HEART CATHETERIZATION WITH Isabel Caprice;  Surgeon: Pamella Pert, MD;  Location: [facility] CATH LAB;  Service: Cardiovascular;  Laterality: N/A;   MULTIPLE EXTRACTIONS WITH ALVEOLOPLASTY N/A 07/08/2013   Procedure: Extraction of tooth #'s 22, 27 with alveoloplasty and bilateal mandibular facial exostoses reductions;  Surgeon: Charlynne Pander, DDS;  Location: [facility] ORS;  Service: Oral Surgery;  Laterality: N/A;   MULTIPLE TOOTH EXTRACTIONS  ~ 2008    FAMILY HISTORY: The patient's family history includes Aneurysm in his father; Cancer in his mother and sister; Diabetes in his father; Hypertension in his mother; Stroke in his mother.   SOCIAL HISTORY:  The patient  reports that he quit smoking about 9 years ago. His smoking use included cigarettes and cigars. He started smoking about 51 years ago. He has a 84 pack-year smoking history. He has never used smokeless tobacco. He reports that he does not drink alcohol and does not use drugs.  Review of Systems  Cardiovascular:  Negative for chest pain, dyspnea on exertion, leg swelling, near-syncope, orthopnea, palpitations, paroxysmal nocturnal dyspnea and syncope.  Respiratory:  Negative for shortness  of breath.    PHYSICAL EXAM:    10/16/2022   11:12 AM 04/18/2022    8:17 AM 04/18/2022    4:30 AM  Vitals with BMI  Height 6\' 2"     Weight 300 lbs    BMI 38.5    Systolic 113 151 295  Diastolic 68 88 60  Pulse 86 84 79   Physical Exam  Constitutional: No distress.  Age appropriate, hemodynamically stable.   Neck: No JVD present.  Cardiovascular: Normal rate, regular rhythm, S1 normal, S2 normal, intact distal pulses and normal pulses. Exam reveals no gallop, no S3 and no S4.  No murmur heard. Pulmonary/Chest: Effort normal and breath sounds normal. No stridor. He has no wheezes. He has no rales.  Abdominal: Soft. Bowel sounds are normal. He exhibits no distension. There is no abdominal tenderness.  Musculoskeletal:        General: No edema.     Cervical back: Neck supple.  Neurological: He is alert and oriented to person, place, and time. He has intact cranial nerves (2-12).  Skin: Skin is warm and moist.   CARDIAC DATABASE: EKG: 10/16/2022: Sinus rhythm, 83 bpm, left axis, left anterior fascicular block, right bundle branch block, without underlying injury  pattern.  Echocardiogram: 03/28/2022: Left ventricle cavity is normal in size. Mild eccentric hypertrophy of the left ventricle. Normal global wall motion. Normal LV systolic function with visual EF 50-55%. Doppler evidence of grade I (impaired) diastolic dysfunction, normal LAP. The aortic root is mildly dilated at 3.7 cm. Mildly dilated ascending aorta at 4.0 cm. Ascending aorta was not well visualized on previous study in 2020. LA was reported mildly dilated then.   Stress Testing:  Lexiscan Myoview stress test 06/28/2018: 1. Lexiscan stress test was performed. Exercise capacity was not assessed. Stress symptoms included dyspnea, dizziness.  Resting blood pressure was 132/90 mmHg and peak effect blood pressure was 138/82 mmHg. The resting and stress electrocardiogram demonstrated sinus tachycardia, left anterior fascicular block, possible old posterior infarct, no resting arrhythmias and normal repolarization.  Stress EKG is non diagnostic for ischemia as it is a pharmacologic stress.  2. The overall quality of the study is fair.  Left ventricular cavity is noted to be normal on the rest and stress studies.  Gated SPECT images reveal normal myocardial thickening and wall motion.  The left ventricular ejection fraction was calculated or visually estimated to be 54%.  REST and STRESS images demonstrate decreased tracer uptake in the basal inferior and mid inferior segments of the left ventricle, likely due to tissue attenuation artifact (BMI 42). Ischemia in this region cannot be excluded. Recommend clinical correlation.  3. Low risk study.  Heart Catheterization: Coronary Angiogram  [12/31/2010]: Heart cath 12/31/10: Noral LVEF. Slow flow in coronary arteries without stenosis. Mild pulmonary HTN.   LABORATORY DATA:    Latest Ref Rng & Units 04/18/2022    3:07 PM 04/18/2022    8:08 AM 04/17/2022    4:34 AM  CBC  WBC 4.0 - 10.5 K/uL  9.8  8.8   Hemoglobin 13.0 - 17.0 g/dL 8.3  8.3  7.5    Hematocrit 39.0 - 52.0 % 24.4  26.4  22.7   Platelets 150 - 400 K/uL  224  177        Latest Ref Rng & Units 04/18/2022    8:08 AM 04/17/2022   12:45 PM 04/16/2022   11:55 AM  CMP  Glucose 70 - 99 mg/dL 621  308    BUN  8 - 23 mg/dL 5  10    Creatinine 2.95 - 1.24 mg/dL 6.21  3.08    Sodium 657 - 145 mmol/L 139  138    Potassium 3.5 - 5.1 mmol/L 3.5  3.7    Chloride 98 - 111 mmol/L 105  106    CO2 22 - 32 mmol/L 24  24    Calcium 8.9 - 10.3 mg/dL 8.3  8.2    Alkaline Phos 38 - 126 U/L   91     Lipid Panel     Component Value Date/Time   CHOL 97 (L) 11/13/2020 0841   TRIG 54 11/13/2020 0841   HDL 42 11/13/2020 0841   LDLCALC 42 11/13/2020 0841   LDLDIRECT 43 11/13/2020 0841   LABVLDL 13 11/13/2020 0841    Lab Results  Component Value Date   HGBA1C 4.9 04/13/2022   No components found for: "NTPROBNP" Lab Results  Component Value Date   TSH 1.160 04/16/2018   TSH 1.588 04/10/2017   TSH 1.089 04/11/2016    Cardiac Panel (last 3 results) No results for input(s): "CKTOTAL", "CKMB", "TROPONINIHS", "RELINDX" in the last 72 hours.  IMPRESSION:    ICD-10-CM   1. Chronic heart failure with preserved ejection fraction (HCC)  I50.32 EKG 12-Lead    2. Dilatation of aorta (HCC)  I77.819 CT CHEST WO CONTRAST    3. Mixed hyperlipidemia  E78.2     4. Essential hypertension  I10     5. Type 2 diabetes mellitus with hyperglycemia, without long-term current use of insulin (HCC)  E11.65     6. Former smoker  Z87.891     7. OSA on CPAP  G47.33     8. Class 2 severe obesity due to excess calories with serious comorbidity and body mass index (BMI) of 38.0 to 38.9 in adult [facility])  E66.01    Z68.38        RECOMMENDATIONS: Melvin Hudson is a 67 y.o. male whose past medical history and cardiovascular risk factors include: GI bleed (February and March 2024), hypertension, diabetes mellitus, former tobacco use, history of throat cancer diagnosis in 2015, no significant epicardial  coronary disease per angiography in 2012, chronic heart failure with preserved EF, OSA on CPAP, advanced age.  Chronic heart failure with preserved ejection fraction (HCC) Currently euvolemic. No hospitalizations for congestive heart failure since last office visit. Medications reconciled. Has lost 11 pounds since last visit likely due to Ozempic. Reemphasized the importance of secondary prevention with focus on improving her modifiable cardiovascular risk factors such as glycemic control, lipid management, blood pressure control, weight loss.  Dilatation of aorta (HCC) Echocardiogram in February 2024 noted aortic to be 40 mm.   Ordered a CT of the chest without contrast as a 1 year follow-up study to reevaluate aortic dimensions. This study was ordered after the office visit; however, I did ask medical staff to reach out to him to update the plan of care  Mixed hyperlipidemia Currently on atorvastatin. Does not endorse myalgias. Lipids are currently being checked by PCP.  He has an appointment in the next week.  Have asked him to send [place] a copy for reference  Essential hypertension Office blood pressures are well-controlled. Medications reconciled. No changes warranted  Type 2 diabetes mellitus with hyperglycemia, without long-term current use of insulin (HCC) Reemphasized importance of glycemic control  OSA on CPAP Patient endorses compliance with CPAP  I have asked him to follow up w/ his GI provider given two episodes  of GI Bleed. Call if questions arise.  FINAL MEDICATION LIST END OF ENCOUNTER: No orders of the defined types were placed in this encounter.    Current Outpatient Medications:    allopurinol (ZYLOPRIM) 300 MG tablet, Take 300 mg by mouth daily., Disp: , Rfl:    atorvastatin (LIPITOR) 40 MG tablet, Take 1 tablet (40 mg total) by mouth at bedtime., Disp: 90 tablet, Rfl: 1   Blood Glucose Monitoring Suppl (ONETOUCH VERIO FLEX SYSTEM) w/Device KIT, USE TO CHECK  BLOOD SUGAR TWICE DAILY, Disp: , Rfl:    carvedilol (COREG) 25 MG tablet, Take 25 mg by mouth 2 (two) times daily with a meal., Disp: , Rfl:    clonazePAM (KLONOPIN) 1 MG disintegrating tablet, Take 1 mg by mouth 3 (three) times daily as needed (Anxiety)., Disp: , Rfl:    dapagliflozin propanediol (FARXIGA) 10 MG TABS tablet, Take 10 mg by mouth daily., Disp: , Rfl:    diphenhydrAMINE (BENADRYL) 25 MG tablet, Take 1 tablet (25 mg total) by mouth every 6 (six) hours. (Patient taking differently: Take 25 mg by mouth every 6 (six) hours as needed for allergies or sleep.), Disp: 20 tablet, Rfl: 0   ferrous sulfate 325 (65 FE) MG tablet, Take 1 tablet (325 mg total) by mouth every Monday, Wednesday, and Friday., Disp: 60 tablet, Rfl: 1   gabapentin (NEURONTIN) 300 MG capsule, Take 300 mg by mouth 3 (three) times daily. , Disp: , Rfl: 2   isosorbide-hydrALAZINE (BIDIL) 20-37.5 MG tablet, Take 1 tablet by mouth 3 (three) times daily., Disp: , Rfl:    metFORMIN (GLUCOPHAGE) 500 MG tablet, Take 500 mg by mouth 2 (two) times daily with a meal. , Disp: , Rfl:    NARCAN 4 MG/0.1ML LIQD nasal spray kit, Place 1 spray into the nose once as needed (Overdose)., Disp: , Rfl:    NIFEdipine (PROCARDIA XL/NIFEDICAL-XL) 90 MG 24 hr tablet, Take 90 mg by mouth daily., Disp: , Rfl:    omeprazole (PRILOSEC) 20 MG capsule, Take 20 mg by mouth daily. , Disp: , Rfl: 4   ONETOUCH VERIO test strip, USE TO CHECK BLOOD SUGAR TWICE DAILY, Disp: , Rfl:    oxyCODONE-acetaminophen (PERCOCET) 10-325 MG per tablet, Take 1 tablet by mouth every 6 (six) hours as needed for pain. (Patient taking differently: Take 1 tablet by mouth 4 (four) times daily as needed for pain.), Disp: 120 tablet, Rfl: 0   OZEMPIC, 0.25 OR 0.5 MG/DOSE, 2 MG/1.5ML SOPN, Inject 0.25 mg into the skin once a week. Sunday's, Disp: , Rfl:    polyethylene glycol (MIRALAX / GLYCOLAX) 17 g packet, Take 17 g by mouth daily., Disp: 14 each, Rfl: 0   psyllium  (HYDROCIL/METAMUCIL) 95 % PACK, Take 1 packet by mouth daily., Disp: 240 each, Rfl: 0   senna-docusate (SENOKOT-S) 8.6-50 MG tablet, Take 1 tablet by mouth 2 (two) times daily., Disp: 60 tablet, Rfl: 1   spironolactone (ALDACTONE) 50 MG tablet, Take 50 mg by mouth daily., Disp: , Rfl:    calcium carbonate (TUMS - DOSED IN MG ELEMENTAL CALCIUM) 500 MG chewable tablet, Chew 2 tablets (400 mg of elemental calcium total) by mouth 3 (three) times daily with meals. (Patient not taking: Reported on 10/16/2022), Disp: 180 tablet, Rfl: 1  Orders Placed This Encounter  Procedures   CT CHEST WO CONTRAST   EKG 12-Lead   --Continue cardiac medications as reconciled in final medication list. --Return in about 6 months (around 04/17/2023) for Follow up HFpEF. Or  sooner if needed. --Continue follow-up with your primary care physician regarding the management of your other chronic comorbid conditions.  Patient's questions and concerns were addressed to his satisfaction. He voices understanding of the instructions provided during this encounter.   This note was created using a voice recognition software as a result there may be grammatical errors inadvertently enclosed that do not reflect the nature of this encounter. Every attempt is made to correct such errors.  Tessa Lerner, [place], [facility]  Pager:  [phone] Office: [phone]

## 2022-11-18 ENCOUNTER — Ambulatory Visit: Payer: Medicare HMO | Admitting: Adult Health

## 2022-11-21 ENCOUNTER — Telehealth: Payer: Self-pay | Admitting: Adult Health

## 2022-11-21 NOTE — Telephone Encounter (Signed)
Patient is calling because he needs a new CPAP Machine. He is currently scheduled to be seen in November. Please call back and advise.

## 2022-11-21 NOTE — Telephone Encounter (Signed)
Patient was last seen for sleep 11/11/2019 and will need to have office visit. Appt needs to be within the last 30 days to get new machine

## 2022-12-22 ENCOUNTER — Encounter (HOSPITAL_BASED_OUTPATIENT_CLINIC_OR_DEPARTMENT_OTHER): Payer: Self-pay

## 2022-12-23 ENCOUNTER — Telehealth: Payer: Self-pay | Admitting: Adult Health

## 2022-12-23 DIAGNOSIS — G4733 Obstructive sleep apnea (adult) (pediatric): Secondary | ICD-10-CM

## 2022-12-23 NOTE — Telephone Encounter (Signed)
PT calling saying he wonder if he can change from masks to a nose connection for his CPAP. He had throat cancer and it makes his throat sore. He is also worried it will put him at risk since his cancer scare. Please call to advise.  His Number is [phone]

## 2022-12-31 NOTE — Telephone Encounter (Signed)
Patient states he currently is using a mask that covers his nose and mouth.  It is making his throat sore.  He has not used his CPAP in over a week due to this.  He is worried also about his hx of throat cancer.  Patient would like to try a nasal pillow or other device that does not cover his mouth.    Order states mask of choice, so will send in DME order to have mask changed to nasal pillow. (Did ask Tammy's nurse, Herbert Seta, to make sure this was okay).

## 2023-01-06 ENCOUNTER — Ambulatory Visit: Payer: Medicare HMO | Admitting: Adult Health

## 2023-01-07 ENCOUNTER — Other Ambulatory Visit (HOSPITAL_COMMUNITY): Payer: Medicare HMO | Admitting: Dentistry

## 2023-02-03 ENCOUNTER — Ambulatory Visit (INDEPENDENT_AMBULATORY_CARE_PROVIDER_SITE_OTHER): Payer: Medicare HMO | Admitting: Podiatry

## 2023-02-03 DIAGNOSIS — B351 Tinea unguium: Secondary | ICD-10-CM | POA: Diagnosis not present

## 2023-02-03 DIAGNOSIS — M79675 Pain in left toe(s): Secondary | ICD-10-CM | POA: Diagnosis not present

## 2023-02-03 DIAGNOSIS — M216X2 Other acquired deformities of left foot: Secondary | ICD-10-CM | POA: Diagnosis not present

## 2023-02-03 DIAGNOSIS — M216X1 Other acquired deformities of right foot: Secondary | ICD-10-CM

## 2023-02-03 DIAGNOSIS — M79674 Pain in right toe(s): Secondary | ICD-10-CM

## 2023-02-03 DIAGNOSIS — E1151 Type 2 diabetes mellitus with diabetic peripheral angiopathy without gangrene: Secondary | ICD-10-CM

## 2023-02-03 NOTE — Progress Notes (Unsigned)
Subjective:  Patient ID: Melvin Hudson, male    DOB: [birth year],  MRN: [id number]  Melvin Hudson presents to clinic today for:  Chief Complaint  Patient presents with   Diabetes    Patient is here for routine [facility]   Patient notes nails are thick, discolored, elongated and painful in shoegear when trying to ambulate.  Patient has been diabetic for the past 4 years.  States his last HbA1c is 5.7.  Notes his blood sugar usually runs around 110 mg/dL.  He denies numbness in his feet.  Denies any painful calluses.  PCP is Sharmon Revere, MD.  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    Past Surgical History:  Procedure Laterality Date   COLONOSCOPY Left 04/11/2014   Procedure: COLONOSCOPY;  Surgeon: Willis Modena, MD;  Location: [facility] ENDOSCOPY;  Service: Endoscopy;  Laterality: Left;   COLONOSCOPY N/A 04/20/2014   Procedure: COLONOSCOPY;  Surgeon: Shirley Friar, MD;  Location: [facility] ENDOSCOPY;  Service: Endoscopy;  Laterality: N/A;   COLONOSCOPY W/ BIOPSIES AND POLYPECTOMY     benign   DIRECT LARYNGOSCOPY  01/05/2014   DIRECT LARYNGOSCOPY N/A 01/05/2014   Procedure: DIRECT LARYNGOSCOPY AND BIOPSY;  Surgeon: [name], MD;  Location: [facility] OR;  Service: ENT;  Laterality: N/A;   KNEE ARTHROSCOPY Right ~ 1994   LEFT AND RIGHT HEART CATHETERIZATION WITH CORONARY/GRAFT ANGIOGRAM N/A 12/31/2010   Procedure: LEFT AND RIGHT HEART CATHETERIZATION WITH Isabel Caprice;  Surgeon: Pamella Pert,  MD;  Location: [facility] CATH LAB;  Service: Cardiovascular;  Laterality: N/A;   MULTIPLE EXTRACTIONS WITH ALVEOLOPLASTY N/A 07/08/2013   Procedure: Extraction of tooth #'s 22, 27 with alveoloplasty and bilateal mandibular facial exostoses reductions;  Surgeon: Charlynne Pander, DDS;  Location: [facility] ORS;  Service: Oral Surgery;  Laterality: N/A;   MULTIPLE TOOTH EXTRACTIONS  ~ 2008    Allergies  Allergen Reactions   Azilsartan Itching    Avoid ARB and ACEI per MD   Advil [Ibuprofen]     Feels "jittery"    Review of Systems: Negative except as noted in the HPI.  Objective:  Melvin Hudson is a pleasant 67 y.o. male in NAD. AAO x 3.  Vascular Examination: Capillary refill time is 3-5 seconds to toes bilateral. Trace palpable pedal pulses b/l LE. Digital hair sparse b/l.  Skin temperature gradient WNL b/l. No varicosities b/l. No cyanosis noted b/l.   Dermatological Examination: Pedal skin with normal turgor, texture and tone b/l. No open wounds. No interdigital macerations b/l. Toenails x10 are 3mm thick, discolored, dystrophic with subungual debris. There is pain with compression of the nail plates.  They are elongated x10  Neurological Examination: Protective sensation intact bilateral LE. Vibratory sensation intact bilateral LE.     Latest Ref Rng & Units 04/13/2022    7:48 PM  Hemoglobin A1C  Hemoglobin-A1c 4.8 - 5.6 % 4.9    Assessment/Plan: 1. Pain due  to onychomycosis of toenails of both feet   2. Type II diabetes mellitus with peripheral circulatory disorder (HCC)   3. Pronation deformity of left foot   4. Pronation deformity of right foot     The mycotic toenails were sharply debrided x10 with sterile nail nippers and a power debriding burr to decrease bulk/thickness and length.    Discussed the diabetic shoe program with the patient today.  He would be interested in having a diabetic shoe consult with our pedorthist.  This will get scheduled today.  He would benefit from  custom diabetic inserts due to his significant flatfoot deformity.  Return in about 3 months (around 05/04/2023) for [facility].   Clerance Lav, DPM, FACFAS [facility]     [address] [place], [place] [address]                Office [phone]  Fax [phone]

## 2023-02-09 ENCOUNTER — Other Ambulatory Visit: Payer: Self-pay | Admitting: Otolaryngology

## 2023-02-09 DIAGNOSIS — R1314 Dysphagia, pharyngoesophageal phase: Secondary | ICD-10-CM

## 2023-02-10 ENCOUNTER — Ambulatory Visit: Payer: Medicare HMO | Admitting: Adult Health

## 2023-02-24 ENCOUNTER — Ambulatory Visit: Payer: 59

## 2023-02-24 DIAGNOSIS — M216X1 Other acquired deformities of right foot: Secondary | ICD-10-CM

## 2023-02-24 DIAGNOSIS — M216X2 Other acquired deformities of left foot: Secondary | ICD-10-CM

## 2023-02-24 DIAGNOSIS — M2141 Flat foot [pes planus] (acquired), right foot: Secondary | ICD-10-CM

## 2023-02-24 DIAGNOSIS — E1151 Type 2 diabetes mellitus with diabetic peripheral angiopathy without gangrene: Secondary | ICD-10-CM

## 2023-02-24 NOTE — Progress Notes (Signed)
 Patient presents to the office today for diabetic shoe and insole measuring.  Patient was measured with brannock device to determine size and width for 1 pair of extra depth shoes and foam casted for 3 pair of insoles.   Documentation of medical necessity will be sent to patient's treating diabetic doctor to verify and sign.   Patient's diabetic provider: Himanshu PaliwalMD  Shoes and insoles will be ordered at that time and patient will be notified for an appointment for fitting when they arrive.   Shoe size (per patient): 12.5-*13 Brannock measurement: 12.5 Patient shoe selection- Shoe choice:   [id number] and [id number] Shoe size ordered: 13XWD  ABN and Financials signed  Lolita Schultze Cped, CFo, CFm

## 2023-03-01 ENCOUNTER — Encounter (HOSPITAL_BASED_OUTPATIENT_CLINIC_OR_DEPARTMENT_OTHER): Payer: Self-pay | Admitting: Emergency Medicine

## 2023-03-01 ENCOUNTER — Emergency Department (HOSPITAL_BASED_OUTPATIENT_CLINIC_OR_DEPARTMENT_OTHER)
Admission: EM | Admit: 2023-03-01 | Discharge: 2023-03-01 | Disposition: A | Discharge status: Home / Self Care | Payer: 59 | Attending: Emergency Medicine | Admitting: Emergency Medicine

## 2023-03-01 ENCOUNTER — Other Ambulatory Visit: Payer: Self-pay

## 2023-03-01 DIAGNOSIS — R1310 Dysphagia, unspecified: Secondary | ICD-10-CM | POA: Diagnosis present

## 2023-03-01 MED ORDER — DEXAMETHASONE SODIUM PHOSPHATE 10 MG/ML IJ SOLN
10.0000 mg | Freq: Once | INTRAMUSCULAR | Status: AC
  Administered 2023-03-01: 10 mg via INTRAMUSCULAR
  Filled 2023-03-01: qty 1

## 2023-03-01 NOTE — ED Provider Notes (Signed)
 [place] EMERGENCY DEPARTMENT AT [facility] Provider Note   CSN: [id number] Arrival date & time: 03/01/23  0857     History  Chief Complaint  Patient presents with   Oral Swelling    Melvin Hudson is a 68 y.o. male.  Patient with history of pharyngeal cancer presents to the emergency department with dysphagia.  Patient follows with Dr. Jesus of ENT.  He has a barium swallow scheduled for 03/05/23 for ongoing difficulty swallowing.  He was seen in urgent care about a month ago and given a shot of Decadron .  He states that this helped for a little while.  He had a follow-up with Dr. Jesus on 01/29/24.  Today, he presents with ongoing difficulty swallowing.  He states that there are certain things that he was eating and drinking that were going down, however his symptoms continued to gradually worsen.  This morning was having difficulty swallowing pills.  He feels like intake gets stuck in his throat.  Sometimes it will come back up.  Other times it will work itself down.  He has had cough recently, but not over the past 24 hours.  No respiratory distress.  Patient is frustrated that his workup is taking so long and he is concerned that he has another cancer.  He did have a CT scan at the beginning of November in the [facility] system which reportedly noted something abnormal.  Per ENT note, they were working to obtain the images in the interim.  Patient has not heard back about this.       Home Medications Prior to Admission medications   Medication Sig Start Date End Date Taking? Authorizing Provider  allopurinol  (ZYLOPRIM ) 300 MG tablet Take 300 mg by mouth daily.    [provider]  atorvastatin  (LIPITOR) 40 MG tablet Take 1 tablet (40 mg total) by mouth at bedtime. 10/07/19 10/16/22  [name], [name], DO  Blood Glucose Monitoring Suppl (ONETOUCH VERIO FLEX SYSTEM) w/Device KIT USE TO CHECK BLOOD SUGAR TWICE DAILY 04/21/19   [provider]  calcium  carbonate (TUMS -  DOSED IN MG ELEMENTAL CALCIUM ) 500 MG chewable tablet Chew 2 tablets (400 mg of elemental calcium  total) by mouth 3 (three) times daily with meals. 04/18/22   [name], DO  carvedilol  (COREG ) 25 MG tablet Take 25 mg by mouth 2 (two) times daily with a meal.    [provider]  clonazePAM  (KLONOPIN ) 1 MG disintegrating tablet Take 1 mg by mouth 3 (three) times daily as needed (Anxiety). 03/10/22   [provider]  dapagliflozin  propanediol (FARXIGA ) 10 MG TABS tablet Take 10 mg by mouth daily.    [provider]  diphenhydrAMINE  (BENADRYL ) 25 MG tablet Take 1 tablet (25 mg total) by mouth every 6 (six) hours. Patient taking differently: Take 25 mg by mouth every 6 (six) hours as needed for allergies or sleep. 10/29/13   [name], MD  ferrous sulfate  325 (65 FE) MG tablet Take 1 tablet (325 mg total) by mouth every Monday, Wednesday, and Friday. 04/16/22 04/16/23  [name], DO  gabapentin  (NEURONTIN ) 300 MG capsule Take 300 mg by mouth 3 (three) times daily.  01/19/18   [provider]  isosorbide -hydrALAZINE  (BIDIL ) 20-37.5 MG tablet Take 1 tablet by mouth 3 (three) times daily.    [provider]  metFORMIN  (GLUCOPHAGE ) 500 MG tablet Take 500 mg by mouth 2 (two) times daily with a meal.  03/29/18   [provider]  NARCAN  4 MG/0.1ML LIQD  nasal spray kit Place 1 spray into the nose once as needed (Overdose). 12/29/19   [provider]  NIFEdipine  (PROCARDIA  XL/NIFEDICAL-XL) 90 MG 24 hr tablet Take 90 mg by mouth daily.    [provider]  omeprazole  (PRILOSEC) 20 MG capsule Take 20 mg by mouth daily.  06/04/16   [provider]  ONETOUCH VERIO test strip USE TO CHECK BLOOD SUGAR TWICE DAILY 07/20/19   [provider]  oxyCODONE -acetaminophen  (PERCOCET) 10-325 MG per tablet Take 1 tablet by mouth every 6 (six) hours as needed for pain. Patient taking differently: Take 1 tablet by mouth 4 (four) times daily as  needed for pain. 11/11/13   [name], MD  OZEMPIC , 0.25 OR 0.5 MG/DOSE, 2 MG/1.5ML SOPN Inject 0.25 mg into the skin once a week. Sunday's 12/29/19   [provider]  polyethylene glycol (MIRALAX  / GLYCOLAX ) 17 g packet Take 17 g by mouth daily. 04/19/22   [name], DO  psyllium (HYDROCIL/METAMUCIL) 95 % PACK Take 1 packet by mouth daily. 04/15/22   [name], DO  senna-docusate (SENOKOT-S) 8.6-50 MG tablet Take 1 tablet by mouth 2 (two) times daily. 04/18/22   [name], DO  spironolactone  (ALDACTONE ) 50 MG tablet Take 50 mg by mouth daily.    [provider]      Allergies    Azilsartan and Advil [ibuprofen]    Review of Systems   Review of Systems  Physical Exam Updated Vital Signs BP 134/81 (BP Location: Right Arm)   Pulse 84   Temp 97.7 F (36.5 C) (Oral)   Resp 16   Ht 6' 2 (1.88 m)   Wt 129.3 kg   SpO2 94% Comment: RA  BMI 36.59 kg/m  Physical Exam Vitals and nursing note reviewed.  Constitutional:      Appearance: He is well-developed.  HENT:     Head: Normocephalic and atraumatic.     Right Ear: External ear normal.     Left Ear: External ear normal.     Nose: Nose normal.     Mouth/Throat:     Mouth: Mucous membranes are moist.     Pharynx: No oropharyngeal exudate or posterior oropharyngeal erythema.     Comments: Oropharynx appears clear, no masses. Eyes:     Conjunctiva/sclera: Conjunctivae normal.  Cardiovascular:     Rate and Rhythm: Normal rate.  Pulmonary:     Effort: No respiratory distress.     Breath sounds: No wheezing, rhonchi or rales.  Musculoskeletal:     Cervical back: Normal range of motion and neck supple.  Skin:    General: Skin is warm and dry.  Neurological:     Mental Status: He is alert.     ED Results / Procedures / Treatments   Labs (all labs ordered are listed, but only abnormal results are displayed) Labs Reviewed - No data to display  EKG None  Radiology No results  found.  Procedures Procedures    Medications Ordered in ED Medications  dexamethasone  (DECADRON ) injection 10 mg (has no administration in time range)    ED Course/ Medical Decision Making/ A&P    Patient seen and examined. History obtained directly from patient.  And wife at bedside.  I reviewed recent urgent care and ENT notes.  I am unable to find a report from the CT that the patient had in early November, in epic or Care Everywhere.  Labs/EKG: None ordered  Imaging: None ordered  Medications/Fluids: IM Decadron   Most recent  vital signs reviewed and are as follows: BP 134/81 (BP Location: Right Arm)   Pulse 84   Temp 97.7 F (36.5 C) (Oral)   Resp 16   Ht 6' 2 (1.88 m)   Wt 129.3 kg   SpO2 94% Comment: RA  BMI 36.59 kg/m   Initial impression: Dysphagia  Home treatment plan: Continue focus on hydration and thickened liquids such as boost  Return instructions discussed with patient: Inability to swallow anything  Follow-up instructions discussed with patient: Keep appointment for barium swallow on 1/16.  Follow-up as needed.                                Medical Decision Making  Patient with progressive dysphagia over the past 1 month.  History of pharyngeal cancer.  He has seen ENT.  He does have a barium swallow scheduled in 4 days.  He presented to the ED today due to his progressive symptoms.  He does report some regurgitation, but is handling secretions and looks comfortable at time of visit today.  He did receive a dose of IM Decadron  at the beginning of December which he felt helped at least temporarily.  Certainly, there is a concern for esophageal obstruction or mass leading to his dysphagia, given progressive nature of symptoms.  I however do not think that he emergently requires admission or specialist consultation today, given well appearance.  Will try another dose of IM Decadron .  Hopefully the patient will have his study done later this week which will  help move the follow-up along.        Final Clinical Impression(s) / ED Diagnoses Final diagnoses:  Dysphagia, unspecified type    Rx / DC Orders ED Discharge Orders     None         Desiderio Chew, PA-C 03/01/23 [id number]    Geraldene Hamilton, MD 03/01/23 [phone]

## 2023-03-01 NOTE — ED Triage Notes (Signed)
 Pt Melvin Hudson, ambulatory, NAD c/o swollen throat for approx 2-3 mo stating is has been getting worse to swallow food and meds. Pt has been seen for same c/o. PMH throat cancer.

## 2023-03-01 NOTE — Discharge Instructions (Signed)
 You were given a shot of Decadron  today.  Hopefully this will help your symptoms.  It is very important that you keep your appointment on Thursday for your barium swallow.  Please contact Dr. Jesus after that test to determine appropriate follow-up.  Return to the emergency department if you find that you are unable to swallow anything.

## 2023-03-01 NOTE — ED Notes (Signed)
 Dc instructions reviewed with patient. Patient voiced understanding. Dc with belongings.

## 2023-03-02 ENCOUNTER — Encounter: Payer: Self-pay | Admitting: Adult Health

## 2023-03-02 ENCOUNTER — Ambulatory Visit (INDEPENDENT_AMBULATORY_CARE_PROVIDER_SITE_OTHER): Payer: 59 | Admitting: Adult Health

## 2023-03-02 VITALS — BP 112/78 | HR 87 | Temp 97.5°F | Ht 74.0 in | Wt 288.4 lb

## 2023-03-02 DIAGNOSIS — I5032 Chronic diastolic (congestive) heart failure: Secondary | ICD-10-CM | POA: Diagnosis not present

## 2023-03-02 DIAGNOSIS — G4733 Obstructive sleep apnea (adult) (pediatric): Secondary | ICD-10-CM | POA: Diagnosis not present

## 2023-03-02 DIAGNOSIS — C099 Malignant neoplasm of tonsil, unspecified: Secondary | ICD-10-CM | POA: Diagnosis not present

## 2023-03-02 DIAGNOSIS — R1310 Dysphagia, unspecified: Secondary | ICD-10-CM | POA: Diagnosis not present

## 2023-03-02 NOTE — Progress Notes (Signed)
 @Patient  ID: Melvin Hudson, male    DOB: 1956/01/10, 68 y.o.   MRN: [id number]  Chief Complaint  Patient presents with   Follow-up    Referring provider: Ilah Crigler, MD  HPI: 68 year old male former smoker followed for severe obstructive sleep apnea Medical history significant for throat cancer s/p XRT , CHF with preserved EF  TEST/EVENTS :  PSG 12/01/11 >> AHI 43   LDCT chest January 16, 2020 mild emphysema, subpleural nodule right upper lobe 3.5 mm.  Lung RADS 2 with benign appearance  03/02/2023 Follow up ; OSA  Patient presents for a follow-up visit.  Patient was last seen September 2021.  Patient has severe obstructive sleep apnea is on nocturnal CPAP.  Patient says he typically wears his CPAP every single night.  However over the last few weeks he has been having difficulty swallowing.  He is being evaluated by ENT.  Has an upcoming barium swallow.  Patient has a history of previous throat cancer with previous radiation therapy.  ENT notes from December 2024 report no abnormal findings on indirect laryngoscopy. Seen in ER yesterday for dysphagia. Has had nausea and intermittent dry heaves, dysphagia, decreased appetite . Has trouble swallowing pills and foods. Received steroid shot, is feeling better. Able to eat now.  Patient says when he wears his CPAP sometimes causes dryness in throat and mouth. .  CPAP download shows 80% compliance with daily average usage at 7 hours.  Patient is on CPAP 11 cm H2O.  AHI 0.5/hour. He loves the CPAP does not want to sleep without it. Feels he benefits from CPAP.  Using a full face mask. Wants to change to nasal mask. Full face mask is leaking and causing dry mouth to be worse. Blowing into eyes.  Has upcoming CT chest scheduled next month.  Following aortic dilation by cardiology      Allergies  Allergen Reactions   Azilsartan Itching    Avoid ARB and ACEI per MD   Advil [Ibuprofen]     Feels jittery    Immunization History   Administered Date(s) Administered   Influenza Inj Mdck Quad Pf 11/26/2017   Influenza Split 11/12/2011, 11/08/2012, 11/17/2016   Influenza,inj,Quad PF,6+ Mos 11/18/2015, 10/24/2016, 10/13/2018   Influenza-Unspecified 11/24/2013   PFIZER(Purple Top)SARS-COV-2 Vaccination 04/15/2019, 05/13/2019, 10/26/2019    Past Medical History:  Diagnosis Date   Angioedema 09/17/11   tongue and lips   Arthritis    both of my legs and feet (01/05/2014)   CHF (congestive heart failure) (HCC)    Diabetes mellitus without complication (HCC)    Erectile dysfunction 04/20/2018   GI bleed    Heart murmur 04/20/2018   Hx of gout    Hypertension    Obesity    OSA on CPAP    Pure hypercholesterolemia 04/20/2018   S/P radiation therapy 07/26/2013-09/15/2013   Right tonsil/bilateral neck/ 7000 cGy   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     Blood Glucose Monitoring Suppl [facility]  VERIO FLEX SYSTEM) w/Device KIT USE TO CHECK BLOOD SUGAR TWICE DAILY     calcium  carbonate (TUMS - DOSED IN MG ELEMENTAL CALCIUM ) 500 MG chewable tablet Chew 2 tablets (400 mg of elemental calcium  total) by mouth 3 (three) times daily with meals. 180 tablet 1   carvedilol  (COREG ) 25 MG tablet Take 25 mg by mouth 2 (two) times daily with a meal.     clonazePAM  (KLONOPIN ) 1 MG disintegrating tablet Take 1 mg by mouth 3 (three) times daily as needed (Anxiety).     dapagliflozin  propanediol (FARXIGA ) 10 MG TABS tablet Take 10 mg by mouth daily.      diphenhydrAMINE  (BENADRYL ) 25 MG tablet Take 1 tablet (25 mg total) by mouth every 6 (six) hours. (Patient taking differently: Take 25 mg by mouth every 6 (six) hours as needed for allergies or sleep.) 20 tablet 0   gabapentin  (NEURONTIN ) 300 MG capsule Take 300 mg by mouth 3 (three) times daily.   2   isosorbide -hydrALAZINE  (BIDIL ) 20-37.5 MG tablet Take 1 tablet by mouth 3 (three) times daily.     NARCAN  4 MG/0.1ML LIQD nasal spray kit Place 1 spray into the nose once as needed (Overdose).     NIFEdipine  (PROCARDIA  XL/NIFEDICAL-XL) 90 MG 24 hr tablet Take 90 mg by mouth daily.     omeprazole  (PRILOSEC) 20 MG capsule Take 20 mg by mouth daily.   4   ONETOUCH VERIO test strip USE TO CHECK BLOOD SUGAR TWICE DAILY     oxyCODONE -acetaminophen  (PERCOCET) 10-325 MG per tablet Take 1 tablet by mouth every 6 (six) hours as needed for pain. (Patient taking differently: Take 1 tablet by mouth 4 (four) times daily as needed for pain.) 120 tablet 0   OZEMPIC , 0.25 OR 0.5 MG/DOSE, 2 MG/1.5ML SOPN Inject 0.25 mg into the skin once a week. Sunday's     polyethylene glycol (MIRALAX  / GLYCOLAX ) 17 g packet Take 17 g by mouth daily. 14 each 0   psyllium (HYDROCIL/METAMUCIL) 95 % PACK Take 1 packet by mouth daily. 240 each 0   senna-docusate (SENOKOT-S) 8.6-50 MG tablet Take 1 tablet by mouth 2 (two) times daily. 60 tablet 1   spironolactone  (ALDACTONE ) 50 MG tablet Take 50 mg by mouth daily.     atorvastatin  (LIPITOR) 40 MG tablet Take 1 tablet (40 mg total) by mouth at bedtime. 90 tablet 1   ferrous sulfate  325 (65 FE) MG tablet Take 1 tablet (325 mg total) by mouth every Monday, Wednesday, and Friday. (Patient not taking: Reported on 03/02/2023) 60 tablet 1   metFORMIN  (GLUCOPHAGE ) 500 MG tablet Take 500 mg by mouth 2 (two) times daily with a meal.  (Patient not taking: Reported on 03/02/2023)     No facility-administered medications prior to visit.     Review of Systems:   Constitutional:   No  weight loss,  night sweats,  Fevers, chills, +fatigue, or  lassitude.  HEENT:   No headaches,  Difficulty swallowing,  Tooth/dental problems, or  Sore throat,                No sneezing, itching, ear ache, nasal congestion, post nasal drip,   CV:  No chest pain,  Orthopnea, PND, swelling in lower extremities, anasarca, dizziness, palpitations, syncope.   GI  bloody stools.   Resp:   No chest wall deformity  Skin: no rash or lesions.  GU: no dysuria, change in color of urine, no urgency or frequency.  No flank pain, no hematuria  MS:  No joint pain or swelling.  No decreased range of motion.  No back pain.    Physical Exam  BP 112/78 (BP Location: Left Arm, Patient Position: Sitting, Cuff Size: Large)   Pulse 87   Temp (!) 97.5 F (36.4 C)   Ht 6' 2 (1.88 m)   Wt 288 lb 6.4 oz (130.8 kg)   SpO2 99%   BMI 37.03 kg/m   GEN: A/Ox3; pleasant , NAD, well nourished    HEENT:  [place]/AT,   NOSE-clear, THROAT-clear, no lesions, no postnasal drip or exudate noted, edentulous, class 2-3 MP airway   NECK:  Supple w/ fair ROM; no JVD; normal carotid impulses w/o bruits; no thyromegaly or nodules palpated; no lymphadenopathy.    RESP  Clear  P & A; w/o, wheezes/ rales/ or rhonchi. no accessory muscle use, no dullness to percussion  CARD:  RRR, no m/r/g, no peripheral edema, pulses intact, no cyanosis or clubbing.  GI:   Soft & nt; nml bowel sounds; no organomegaly or masses detected.   Musco: Warm bil, no deformities or joint swelling noted.   Neuro: alert, no focal deficits noted.    Skin: Warm, no lesions or rashes    Lab Results:      BNP No results found for: BNP    Imaging: No results found.  Administration History     None           No data to display          No results found for: NITRICOXIDE      Assessment & Plan:   OSA (obstructive sleep apnea) Excellent control and compliance on nocturnal CPAP Order for new CPAP Change to DreamWear nasal  mask  Plan  Patient Instructions  Continue on CPAP at bedtime-try to wear each night for at least 6hr  Order for new CPAP machine .  Order for dreamwear nasal .  Saline nasal spray Twice daily   Saline nasal gel At bedtime   Work on healthy weight Do not drive sleepy .  Follow up with ENT as planned .  Follow-up in 1 year with Dr.Olalere or Duvall Comes NP  And As needed      Chronic diastolic heart failure (HCC) Appears euvolemic exam continue follow-up with cardiology  History of squamous cell carcinoma the right tonsil History of throat cancer status post radiation. Patient is having ongoing difficulty with dysphagia.  Continue follow-up with ENT.  Barium swallow is pending  Dysphagia Intermittent dysphagia questionable etiology.  Continue follow-up with ENT.  Barium swallow is pending.     Madelin Stank, NP 03/02/2023

## 2023-03-02 NOTE — Addendum Note (Signed)
 Addended by: Delrae Rend on: 03/02/2023 03:01 PM   Modules accepted: Orders

## 2023-03-02 NOTE — Assessment & Plan Note (Signed)
 Intermittent dysphagia questionable etiology.  Continue follow-up with ENT.  Barium swallow is pending.

## 2023-03-02 NOTE — Patient Instructions (Addendum)
 Continue on CPAP at bedtime-try to wear each night for at least 6hr  Order for new CPAP machine .  Order for dreamwear nasal .  Saline nasal spray Twice daily   Saline nasal gel At bedtime   Work on healthy weight Do not drive sleepy .  Follow up with ENT as planned .  Follow-up in 1 year with Dr.Olalere or Ayannah Faddis NP  And As needed

## 2023-03-02 NOTE — Assessment & Plan Note (Signed)
 History of throat cancer status post radiation. Patient is having ongoing difficulty with dysphagia.  Continue follow-up with ENT.  Barium swallow is pending

## 2023-03-02 NOTE — Assessment & Plan Note (Signed)
 Appears euvolemic exam continue follow-up with cardiology

## 2023-03-02 NOTE — Addendum Note (Signed)
 Addended by: Delrae Rend on: 03/02/2023 02:43 PM   Modules accepted: Orders

## 2023-03-02 NOTE — Assessment & Plan Note (Addendum)
 Excellent control and compliance on nocturnal CPAP Order for new CPAP Change to DreamWear nasal mask  Plan  Patient Instructions  Continue on CPAP at bedtime-try to wear each night for at least 6hr  Order for new CPAP machine .  Order for dreamwear nasal .  Saline nasal spray Twice daily   Saline nasal gel At bedtime   Work on healthy weight Do not drive sleepy .  Follow up with ENT as planned .  Follow-up in 1 year with Dr.Olalere or Nakaya Mishkin NP  And As needed

## 2023-03-03 ENCOUNTER — Other Ambulatory Visit: Payer: Medicare HMO

## 2023-03-04 ENCOUNTER — Encounter: Payer: Self-pay | Admitting: Acute Care

## 2023-03-05 ENCOUNTER — Ambulatory Visit
Admission: RE | Admit: 2023-03-05 | Discharge: 2023-03-05 | Disposition: A | Discharge status: Home / Self Care | Payer: 59 | Source: Ambulatory Visit | Attending: Otolaryngology | Admitting: Otolaryngology

## 2023-03-05 DIAGNOSIS — R1314 Dysphagia, pharyngoesophageal phase: Secondary | ICD-10-CM

## 2023-04-01 ENCOUNTER — Telehealth: Payer: Self-pay | Admitting: Adult Health

## 2023-04-01 DIAGNOSIS — G4733 Obstructive sleep apnea (adult) (pediatric): Secondary | ICD-10-CM

## 2023-04-01 NOTE — Telephone Encounter (Signed)
PT calling because he'd like his pressure turned down. He can not sleep and can barely keep it on it is blowing so hard. His # is [phone]

## 2023-04-01 NOTE — Telephone Encounter (Signed)
   Spoke to patient. He feels that CPAP pressure is too strong.   TP, please advise. Thanks

## 2023-04-02 NOTE — Telephone Encounter (Signed)
Sorry to hear that can decrease CPAP pressure to auto CPAP 7 to 10 cm H2O Check CPAP download in 4 weeks.

## 2023-04-02 NOTE — Telephone Encounter (Signed)
Order has been placed and I have notified the patient and scheduled him an appt for 3/13 at 9:30am.  Nothing further needed.

## 2023-04-17 ENCOUNTER — Ambulatory Visit: Payer: Self-pay | Admitting: Cardiology

## 2023-04-20 ENCOUNTER — Ambulatory Visit: Payer: 59 | Attending: Cardiology | Admitting: Cardiology

## 2023-04-20 ENCOUNTER — Telehealth: Payer: Self-pay | Admitting: Cardiology

## 2023-04-20 ENCOUNTER — Encounter: Payer: Self-pay | Admitting: Cardiology

## 2023-04-20 VITALS — BP 100/66 | HR 77 | Resp 16 | Ht 74.0 in | Wt 267.4 lb

## 2023-04-20 DIAGNOSIS — I5032 Chronic diastolic (congestive) heart failure: Secondary | ICD-10-CM | POA: Diagnosis not present

## 2023-04-20 DIAGNOSIS — E6609 Other obesity due to excess calories: Secondary | ICD-10-CM

## 2023-04-20 DIAGNOSIS — I77819 Aortic ectasia, unspecified site: Secondary | ICD-10-CM | POA: Diagnosis not present

## 2023-04-20 DIAGNOSIS — E1165 Type 2 diabetes mellitus with hyperglycemia: Secondary | ICD-10-CM

## 2023-04-20 DIAGNOSIS — Z87891 Personal history of nicotine dependence: Secondary | ICD-10-CM

## 2023-04-20 DIAGNOSIS — E782 Mixed hyperlipidemia: Secondary | ICD-10-CM | POA: Diagnosis not present

## 2023-04-20 DIAGNOSIS — G4733 Obstructive sleep apnea (adult) (pediatric): Secondary | ICD-10-CM

## 2023-04-20 DIAGNOSIS — Z6834 Body mass index (BMI) 34.0-34.9, adult: Secondary | ICD-10-CM

## 2023-04-20 DIAGNOSIS — E66811 Obesity, class 1: Secondary | ICD-10-CM

## 2023-04-20 DIAGNOSIS — I10 Essential (primary) hypertension: Secondary | ICD-10-CM

## 2023-04-20 MED ORDER — LOSARTAN POTASSIUM 25 MG PO TABS: 25.0000 mg | ORAL_TABLET | Freq: Every evening | ORAL | 3 refills | Status: DC

## 2023-04-20 MED ORDER — ISOSORB DINITRATE-HYDRALAZINE 20-37.5 MG PO TABS: 1.0000 | ORAL_TABLET | Freq: Two times a day (BID) | ORAL | 3 refills | Status: DC

## 2023-04-20 MED ORDER — SPIRONOLACTONE 25 MG PO TABS: 25.0000 mg | ORAL_TABLET | Freq: Every morning | ORAL | 3 refills | Status: DC

## 2023-04-20 NOTE — Patient Instructions (Addendum)
 Medication Instructions:  Your physician has recommended you make the following change in your medication:   START Spironolactone 25 mg once daily in the morning  START Losartan 25 mg once daily in the evening   CHANGE how you take Isosorbide-Hydralazine (Bidil) - take twice daily (once in the morning and once in the evening)  CHANGE how you take Nifedipine- take in the evening      *If you need a refill on your cardiac medications before your next appointment, please call your pharmacy*  Lab Work: To be completed in 1 week: FASTING lipid panel, CMP, direct LDL  If you have labs (blood work) drawn today and your tests are completely normal, you will receive your results only by: MyChart Message (if you have MyChart) OR A paper copy in the mail If you have any lab test that is abnormal or we need to change your treatment, we will call you to review the results.  Testing/Procedures: None ordered today.  Follow-Up: At [facility], you and your health needs are our priority.  As part of our continuing mission to provide you with exceptional heart care, we have created designated Provider Care Teams.  These Care Teams include your primary Cardiologist (physician) and Advanced Practice Providers (APPs -  Physician Assistants and Nurse Practitioners) who all work together to provide you with the care you need, when you need it.   We recommend signing up for the patient portal called "MyChart".  Sign up information is provided on this After Visit Summary.  MyChart is used to connect with patients for Virtual Visits (Telemedicine).  Patients are able to view lab/test results, encounter notes, upcoming appointments, etc.  Non-urgent messages can be sent to your provider as well.   To learn more about what you can do with MyChart, go to [email or web address].    Your next appointment:   6 month(s)  The format for your next appointment:   In Person  Provider:   Tessa Lerner, DO  {  Schedule your Chest CT without Contrast at checkout.

## 2023-04-20 NOTE — Telephone Encounter (Signed)
 Pharmacy called and the Losartan prescription has been canceled due to pt allergy. Medication list has been updated accordingly.

## 2023-04-20 NOTE — Progress Notes (Addendum)
 Cardiology Office Note:  .   Date:  04/20/2023  ID:  Melvin Hudson, DOB 04/25/1955, MRN [id number] PCP:  Sharmon Revere, MD  Former Cardiology Providers: Altamese Nambe, APRN, FNP-C  [facility] Providers Cardiologist:  Tessa Lerner, DO , [facility] (established care 09/13/2019 ) Electrophysiologist:  None  Click to update primary MD,subspecialty MD or APP then REFRESH:1}    Chief Complaint  Patient presents with   Chronic heart failure with preserved ejection fraction   Follow-up    History of Present Illness: .   Melvin Hudson is a 68 y.o. African-American male whose past medical history and cardiovascular risk factors includes: GI bleed (February and March 2024), history of PRBCs, hypertension, diabetes mellitus, former tobacco use, history of throat cancer diagnosis in 2015, no significant epicardial coronary disease per angiography in 2012, chronic heart failure with preserved EF, OSA on CPAP, advanced age.   Patient is being followed to the practice given his history of HFpEF and hyperlipidemia.  He presents today for 69-month follow-up visit.  Patient denies anginal chest pain or heart failure symptoms.  He has lost 22 pounds in the last office visit secondary to being on pharmacological therapy as well as dysphagia.  He is currently working with GI and has a procedure coming up according to him.  His blood pressures are also quite soft due to decreased oral intake as well as weight loss.  Denies near-syncope or syncopal events.  Patient remains anemic but denies any recurrent GI bleeds.  Of note he has had GI bleeds in the past requiring blood transfusions.  Review of Systems: .   Review of Systems  Constitutional: Positive for weight loss (22#).  Cardiovascular:  Negative for chest pain, dyspnea on exertion, leg swelling, near-syncope, orthopnea, palpitations, paroxysmal nocturnal dyspnea and syncope.  Respiratory:  Negative for shortness of breath.   Gastrointestinal:   Positive for dysphagia.    Studies Reviewed:   EKG: EKG Interpretation Date/Time:  Monday April 20 2023 09:22:47 EST Ventricular Rate:  77 PR Interval:  218 QRS Duration:  170 QT Interval:  444 QTC Calculation: 502 R Axis:   265  Text Interpretation: Sinus rhythm with 1st degree A-V block Premature ventricular complexes Right bundle branch block When compared with ECG of 16-Apr-2022 14:18, Nonspecific T wave abnormality has replaced inverted T waves in Inferior leads Confirmed by Tessa Lerner [phone]) on 04/20/2023 9:27:09 AM  Echocardiogram: 03/28/2022: Left ventricle cavity is normal in size. Mild eccentric hypertrophy of the left ventricle. Normal global wall motion. Normal LV systolic function with visual EF 50-55%. Doppler evidence of grade I (impaired) diastolic dysfunction, normal LAP. The aortic root is mildly dilated at 3.7 cm. Mildly dilated ascending aorta at 4.0 cm. Ascending aorta was not well visualized on previous study in 2020. LA was reported mildly dilated then.    Stress Testing:  Lexiscan Myoview stress test 06/28/2018: Low risk study  Coronary Angiogram  [12/31/2010]: Heart cath 12/31/10: Noral LVEF. Slow flow in coronary arteries without stenosis. Mild pulmonary HTN.    RADIOLOGY: NA  Risk Assessment/Calculations:   NA   Labs:       Latest Ref Rng & Units 04/18/2022    3:07 PM 04/18/2022    8:08 AM 04/17/2022    4:34 AM  CBC  WBC 4.0 - 10.5 K/uL  9.8  8.8   Hemoglobin 13.0 - 17.0 g/dL 8.3  8.3  7.5   Hematocrit 39.0 - 52.0 % 24.4  26.4  22.7   Platelets 150 -  400 K/uL  224  177        Latest Ref Rng & Units 04/18/2022    8:08 AM 04/17/2022   12:45 PM 04/16/2022    4:36 AM  BMP  Glucose 70 - 99 mg/dL 096  045  96   BUN 8 - 23 mg/dL 5  10  11    Creatinine 0.61 - 1.24 mg/dL 4.09  8.11  9.14   Sodium 135 - 145 mmol/L 139  138  138   Potassium 3.5 - 5.1 mmol/L 3.5  3.7  3.6   Chloride 98 - 111 mmol/L 105  106  107   CO2 22 - 32 mmol/L 24  24  27     Calcium 8.9 - 10.3 mg/dL 8.3  8.2  8.0       Latest Ref Rng & Units 04/18/2022    8:08 AM 04/17/2022   12:45 PM 04/16/2022   11:55 AM  CMP  Glucose 70 - 99 mg/dL 782  956    BUN 8 - 23 mg/dL 5  10    Creatinine 2.13 - 1.24 mg/dL 0.86  5.78    Sodium 469 - 145 mmol/L 139  138    Potassium 3.5 - 5.1 mmol/L 3.5  3.7    Chloride 98 - 111 mmol/L 105  106    CO2 22 - 32 mmol/L 24  24    Calcium 8.9 - 10.3 mg/dL 8.3  8.2    Alkaline Phos 38 - 126 U/L   91     Lab Results  Component Value Date   CHOL 97 (L) 11/13/2020   HDL 42 11/13/2020   LDLCALC 42 11/13/2020   LDLDIRECT 43 11/13/2020   TRIG 54 11/13/2020   No results for input(s): "LIPOA" in the last 8760 hours. No components found for: "NTPROBNP" No results for input(s): "PROBNP" in the last 8760 hours. No results for input(s): "TSH" in the last 8760 hours.   Physical Exam:    Today's Vitals   04/20/23 0919  BP: 100/66  Pulse: 77  Resp: 16  SpO2: 99%  Weight: 267 lb 6.4 oz (121.3 kg)  Height: 6\' 2"  (1.88 m)   Body mass index is 34.33 kg/m. Wt Readings from Last 3 Encounters:  04/20/23 267 lb 6.4 oz (121.3 kg)  03/02/23 288 lb 6.4 oz (130.8 kg)  03/01/23 285 lb (129.3 kg)    Physical Exam  Constitutional: No distress.  hemodynamically stable  Neck: No JVD present.  Cardiovascular: Normal rate, regular rhythm, S1 normal and S2 normal. Exam reveals no gallop, no S3 and no S4.  No murmur heard. Pulmonary/Chest: Effort normal and breath sounds normal. No stridor. He has no wheezes. He has no rales.  Musculoskeletal:        General: No edema.     Cervical back: Neck supple.  Skin: Skin is warm.     Impression & Recommendation(s):  Impression:   ICD-10-CM   1. Chronic heart failure with preserved ejection fraction (HCC)  I50.32 EKG 12-Lead    2. Dilatation of aorta (HCC)  I77.819     3. Mixed hyperlipidemia  E78.2 Comprehensive metabolic panel    Lipid panel    LDL cholesterol, direct    LDL cholesterol,  direct    Lipid panel    Comprehensive metabolic panel    4. Essential hypertension  I10     5. Type 2 diabetes mellitus with hyperglycemia, without long-term current use of insulin (HCC)  E11.65  6. OSA on CPAP  G47.33     7. Former smoker  Z87.891     8. Class 1 obesity due to excess calories with serious comorbidity and body mass index (BMI) of 34.0 to 34.9 in adult  E66.811    E66.09    Z68.34        Recommendation(s):  Chronic heart failure with preserved ejection fraction (HCC) Overall euvolemic. No hospitalizations for congestive heart failure since last office visit. At the last office visit he had lost 11 pounds and since last office visit he has lost an additional 22 pounds. Since his blood pressures are soft with systolics hovering around 100 mmHg would like to change his GDMT. Patient informs that PCP stopped enalapril secondary to dry cough. Currently he takes the following: Carvedilol 25 mg p.o. twice daily. Farxiga 10 mg p.o. every morning. BiDil 20/37.5 mg p.o. 3 times daily. Procardia 90 mg p.o. every morning  Spironolactone 50 mg p.o. every morning  Recommended the following changes: Decrease spironolactone to 25 mg p.o. every morning Add losartan 25 mg p.o. every afternoon Decrease BiDil to twice a day dosing And recommended taking Procardia at night  Will have labs in 1 week to reevaluate kidney function  Dilatation of aorta (HCC) Echocardiogram in February 2024 noted to have ascending aorta at 40 mm. CT of the chest without contrast was ordered at last office visit but this is still pending.  Mixed hyperlipidemia Currently on Lipitor 40 mg p.o. daily.   He denies myalgia or other side effects. No recent labs available for review.  Will check fasting lipids.     Essential hypertension Blood pressures are quite soft. Likely secondary to underlying anemia, decreased oral intake, weight loss and reduced hydration. Medication changes as  discussed above  Type 2 diabetes mellitus with hyperglycemia, without long-term current use of insulin (HCC) Reemphasized importance of glycemic control. Follow-up with PCP  OSA on CPAP Patient endorses compliance with CPAP.  Orders Placed:  Orders Placed This Encounter  Procedures   Comprehensive metabolic panel    Standing Status:   Future    Number of Occurrences:   1    Expected Date:   04/27/2023    Expiration Date:   04/19/2024   Lipid panel    Standing Status:   Future    Number of Occurrences:   1    Expected Date:   04/27/2023    Expiration Date:   04/19/2024   LDL cholesterol, direct    Standing Status:   Future    Number of Occurrences:   1    Expected Date:   04/27/2023    Expiration Date:   04/19/2024   EKG 12-Lead   Final Medication List:    Meds ordered this encounter  Medications   losartan (COZAAR) 25 MG tablet    Sig: Take 1 tablet (25 mg total) by mouth every evening.    Dispense:  30 tablet    Refill:  3   spironolactone (ALDACTONE) 25 MG tablet    Sig: Take 1 tablet (25 mg total) by mouth in the morning.    Dispense:  90 tablet    Refill:  3   isosorbide-hydrALAZINE (BIDIL) 20-37.5 MG tablet    Sig: Take 1 tablet by mouth in the morning and at bedtime.    Dispense:  90 tablet    Refill:  3    Medications Discontinued During This Encounter  Medication Reason   oxyCODONE-acetaminophen (PERCOCET) 10-325 MG per tablet  Patient Preference   NARCAN 4 MG/0.1ML LIQD nasal spray kit Patient Preference   spironolactone (ALDACTONE) 50 MG tablet Discontinued by provider   isosorbide-hydrALAZINE (BIDIL) 20-37.5 MG tablet      Current Outpatient Medications:    allopurinol (ZYLOPRIM) 300 MG tablet, Take 300 mg by mouth daily., Disp: , Rfl:    atorvastatin (LIPITOR) 40 MG tablet, Take 1 tablet (40 mg total) by mouth at bedtime., Disp: 90 tablet, Rfl: 1   Blood Glucose Monitoring Suppl (ONETOUCH VERIO FLEX SYSTEM) w/Device KIT, USE TO CHECK BLOOD SUGAR TWICE DAILY,  Disp: , Rfl:    calcium carbonate (TUMS - DOSED IN MG ELEMENTAL CALCIUM) 500 MG chewable tablet, Chew 2 tablets (400 mg of elemental calcium total) by mouth 3 (three) times daily with meals. (Patient taking differently: Chew 400 mg of elemental calcium by mouth daily as needed.), Disp: 180 tablet, Rfl: 1   carvedilol (COREG) 25 MG tablet, Take 25 mg by mouth 2 (two) times daily with a meal., Disp: , Rfl:    clonazePAM (KLONOPIN) 1 MG disintegrating tablet, Take 1 mg by mouth 3 (three) times daily as needed (Anxiety)., Disp: , Rfl:    dapagliflozin propanediol (FARXIGA) 10 MG TABS tablet, Take 10 mg by mouth daily., Disp: , Rfl:    diphenhydrAMINE (BENADRYL) 25 MG tablet, Take 1 tablet (25 mg total) by mouth every 6 (six) hours. (Patient taking differently: Take 25 mg by mouth every 6 (six) hours as needed for allergies or sleep.), Disp: 20 tablet, Rfl: 0   gabapentin (NEURONTIN) 300 MG capsule, Take 300 mg by mouth 3 (three) times daily. , Disp: , Rfl: 2   isosorbide-hydrALAZINE (BIDIL) 20-37.5 MG tablet, Take 1 tablet by mouth in the morning and at bedtime., Disp: 90 tablet, Rfl: 3   losartan (COZAAR) 25 MG tablet, Take 1 tablet (25 mg total) by mouth every evening., Disp: 30 tablet, Rfl: 3   NIFEdipine (PROCARDIA XL/NIFEDICAL-XL) 90 MG 24 hr tablet, Take 90 mg by mouth every evening., Disp: , Rfl:    omeprazole (PRILOSEC) 20 MG capsule, Take 20 mg by mouth daily. , Disp: , Rfl: 4   ONETOUCH VERIO test strip, USE TO CHECK BLOOD SUGAR TWICE DAILY, Disp: , Rfl:    OZEMPIC, 0.25 OR 0.5 MG/DOSE, 2 MG/1.5ML SOPN, Inject 0.25 mg into the skin once a week. Sunday's, Disp: , Rfl:    polyethylene glycol (MIRALAX / GLYCOLAX) 17 g packet, Take 17 g by mouth daily. (Patient taking differently: Take 17 g by mouth daily as needed.), Disp: 14 each, Rfl: 0   psyllium (HYDROCIL/METAMUCIL) 95 % PACK, Take 1 packet by mouth daily., Disp: 240 each, Rfl: 0   senna-docusate (SENOKOT-S) 8.6-50 MG tablet, Take 1 tablet by  mouth 2 (two) times daily. (Patient taking differently: Take 1 tablet by mouth daily as needed.), Disp: 60 tablet, Rfl: 1   spironolactone (ALDACTONE) 25 MG tablet, Take 1 tablet (25 mg total) by mouth in the morning., Disp: 90 tablet, Rfl: 3  Consent:   NA  Disposition:   6 months sooner if needed  Patient may be asked to follow-up sooner based on the results of the above-mentioned testing.  His questions and concerns were addressed to his satisfaction. He voices understanding of the recommendations provided during this encounter.    Signed, Tessa Lerner, DO, [facility]  [facility]  [address] [place], [place] [address] 04/20/2023 11:11 AM  Addendum: As stated above tried to transition him from ACE inhibitor's  to ARB given his back. He was on enalapril which was causing him to have ended losartan as an alternative.  However patient states he has tried ARB's in the past and would like to hold off on a retrial at this time.  Natasha Burda [place], DO, [facility]

## 2023-04-20 NOTE — Telephone Encounter (Signed)
 Pt c/o medication issue:  1. Name of Medication:   losartan (COZAAR) 25 MG tablet   2. How are you currently taking this medication (dosage and times per day)?   3. Are you having a reaction (difficulty breathing--STAT)?   4. What is your medication issue?   Caller Tamala Julian) stated patient said he is allergic to medication (Azilsartan) and wants a confirm if patient can take Losartan.

## 2023-04-27 LAB — COMPREHENSIVE METABOLIC PANEL
ALT: 10 IU/L (ref 0–44)
AST: 15 IU/L (ref 0–40)
Albumin: 4.3 g/dL (ref 3.9–4.9)
Alkaline Phosphatase: 177 IU/L — ABNORMAL HIGH (ref 44–121)
BUN/Creatinine Ratio: 7 — ABNORMAL LOW (ref 10–24)
BUN: 6 mg/dL — ABNORMAL LOW (ref 8–27)
Bilirubin Total: 1.1 mg/dL (ref 0.0–1.2)
CO2: 24 mmol/L (ref 20–29)
Calcium: 9.7 mg/dL (ref 8.6–10.2)
Chloride: 100 mmol/L (ref 96–106)
Creatinine, Ser: 0.92 mg/dL (ref 0.76–1.27)
Globulin, Total: 2.8 g/dL (ref 1.5–4.5)
Glucose: 90 mg/dL (ref 70–99)
Potassium: 4.4 mmol/L (ref 3.5–5.2)
Sodium: 139 mmol/L (ref 134–144)
Total Protein: 7.1 g/dL (ref 6.0–8.5)
eGFR: 90 mL/min/{1.73_m2} (ref >59)

## 2023-04-27 LAB — LIPID PANEL
Chol/HDL Ratio: 2.6 ratio (ref 0.0–5.0)
Cholesterol, Total: 85 mg/dL — ABNORMAL LOW (ref 100–199)
HDL: 33 mg/dL — ABNORMAL LOW (ref >39)
LDL Chol Calc (NIH): 37 mg/dL (ref 0–99)
Triglycerides: 64 mg/dL (ref 0–149)
VLDL Cholesterol Cal: 15 mg/dL (ref 5–40)

## 2023-04-27 LAB — LDL CHOLESTEROL, DIRECT: LDL Direct: 39 mg/dL (ref 0–99)

## 2023-04-28 ENCOUNTER — Ambulatory Visit (HOSPITAL_COMMUNITY)
Admission: RE | Admit: 2023-04-28 | Discharge: 2023-04-28 | Disposition: A | Discharge status: Home / Self Care | Source: Ambulatory Visit | Attending: Cardiology | Admitting: Cardiology

## 2023-04-28 DIAGNOSIS — I77819 Aortic ectasia, unspecified site: Secondary | ICD-10-CM | POA: Insufficient documentation

## 2023-04-30 ENCOUNTER — Telehealth (INDEPENDENT_AMBULATORY_CARE_PROVIDER_SITE_OTHER): Payer: 59 | Admitting: Adult Health

## 2023-04-30 DIAGNOSIS — G4733 Obstructive sleep apnea (adult) (pediatric): Secondary | ICD-10-CM | POA: Diagnosis not present

## 2023-04-30 DIAGNOSIS — Z87891 Personal history of nicotine dependence: Secondary | ICD-10-CM

## 2023-04-30 DIAGNOSIS — R1310 Dysphagia, unspecified: Secondary | ICD-10-CM | POA: Diagnosis not present

## 2023-04-30 NOTE — Patient Instructions (Addendum)
 Continue on CPAP at bedtime-try to wear each night for at least 6hr  Order for new full face mask.  Saline nasal spray Twice daily   Saline nasal gel At bedtime   Work on healthy weight Do not drive sleepy .  Follow up with ENT as planned .  Follow-up in 1 year with Dr.Olalere or Michaelanthony Kempton NP  And As needed

## 2023-04-30 NOTE — Progress Notes (Signed)
 Virtual Visit via Video Note  I connected with Melvin Hudson on 04/30/23 at  9:30 AM EDT by a video enabled telemedicine application and verified that I am speaking with the correct person using two identifiers.  Location: Patient: Home  Provider: Office    I discussed the limitations of evaluation and management by telemedicine and the availability of in person appointments. The patient expressed understanding and agreed to proceed.  History of Present Illness: 68 yo male former smoker followed for Severe OSA  Medical history for significant throat cancer s/p XRT, CHF with preserved EF  Patient presents for a 79-month follow-up.  Patient has severe obstructive sleep apnea on nocturnal CPAP.  Was having trouble last visit tolerating CPAP.  Patient changed over to a nasal mask but does not like it. Wants to go back to full face mask. We adjusted his CPAP pressure to auto CPAP 7 cm H2O.  CPAP download shows improved compliance at 89%.  Daily average usage at 7 hours.  Patient is on auto CPAP 7 to 10 cmH2O. AHI 1.6/hour. Got new CPAP machine last month.  DME is [employer].  Feels he is benefiting from CPAP with decreased daytime sleepiness.   Patient has been having difficulty with dysphagia he is being evaluated by ENT.  Patient has a history of throat cancer with previous radiation therapy.Going for Esophagoscopy with Dilation next month on 05/27/23. Esophagram /Barium swallow showed moderate narrowing of the esophagus at the cervicothoracic junction. CT chest recommended with results pending.    Observations/Objective: PSG 12/01/11 >> AHI 43    LDCT chest January 16, 2020 mild emphysema, subpleural nodule right upper lobe 3.5 mm.  Lung RADS 2 with benign appearance  Assessment and Plan: Severe OSA -excellent control and compliance on CPAP with perceived benefit . Continue current settings.  Change to full face mask.   Plan Patient Instructions  Continue on CPAP at bedtime-try to wear each  night for at least 6hr  Order for new full face mask.  Saline nasal spray Twice daily   Saline nasal gel At bedtime   Work on healthy weight Do not drive sleepy .  Follow up with ENT as planned .  Follow-up in 1 year with Dr.Olalere or Bill Mcvey NP  And As needed     Dysphagia with history of throat cancer s/p XRT -following with ENT. Plans for upcoming endoscopy with dilation. CT chest results pending.  Keep ENT follow up .   Follow Up Instructions:    I discussed the assessment and treatment plan with the patient. The patient was provided an opportunity to ask questions and all were answered. The patient agreed with the plan and demonstrated an understanding of the instructions.   The patient was advised to call back or seek an in-person evaluation if the symptoms worsen or if the condition fails to improve as anticipated.  I provided 22 minutes of non-face-to-face time during this encounter.   Rubye Oaks, NP

## 2023-05-04 ENCOUNTER — Ambulatory Visit: Payer: Medicare HMO | Admitting: Podiatry

## 2023-05-15 NOTE — H&P (Signed)
 Melvin Hudson is an 68 y.o. male.   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  Having increasing difficulty swallowing.  Imaging shows stenosis of the upper esophagus.  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    Past Surgical History:  Procedure Laterality Date   COLONOSCOPY Left 04/11/2014   Procedure: COLONOSCOPY;  Surgeon: Willis Modena, MD;  Location: [facility] ENDOSCOPY;  Service: Endoscopy;  Laterality: Left;   COLONOSCOPY N/A 04/20/2014   Procedure: COLONOSCOPY;  Surgeon: Shirley Friar, MD;  Location: [facility] ENDOSCOPY;  Service: Endoscopy;  Laterality: N/A;   COLONOSCOPY W/ BIOPSIES AND POLYPECTOMY     benign   DIRECT LARYNGOSCOPY  01/05/2014   DIRECT LARYNGOSCOPY N/A 01/05/2014   Procedure: DIRECT LARYNGOSCOPY AND BIOPSY;  Surgeon: [name], MD;  Location[facility] OR;  Service: ENT;  Laterality: N/A;   KNEE ARTHROSCOPY Right ~ 1994   LEFT AND RIGHT HEART CATHETERIZATION WITH CORONARY/GRAFT ANGIOGRAM N/A 12/31/2010   Procedure: LEFT AND RIGHT HEART CATHETERIZATION WITH Isabel Caprice;  Surgeon: Pamella Pert, MD;  Location: [facility] C[facility] LAB;  Service: Cardiovascular;  Laterality: N/A;   MULTIPLE EXTRACTIONS WITH ALVEOLOPLASTY N/A 07/08/2013   Procedure: Extraction of tooth #'s 22, 27 with alveoloplasty and bilateal mandibular facial exostoses reductions;  Surgeon: Charlynne Pander, DDS;  Location: [facility] ORS;  Service: Oral Surgery;  Laterality: N/A;    MULTIPLE TOOTH EXTRACTIONS  ~ 2008    Family History  Problem Relation Age of Onset   Cancer Mother    Stroke Mother    Hypertension Mother    Diabetes Father    Aneurysm Father    Cancer Sister        breast ca   Social History:  reports that he quit smoking about 9 years ago. His smoking use included cigarettes and cigars. He started smoking about 51 years ago. He has a 84 pack-year smoking history. He has never used smokeless tobacco. He reports that he does not drink alcohol and does not use drugs.  Allergies:  Allergies  Allergen Reactions   Azilsartan Itching    Avoid ARB and ACEI per MD   Advil [Ibuprofen]     Feels "jittery"    No medications prior to admission.    No results found for this or any previous visit (from the past 48 hours). No results found.  ROS: otherwise negative  There were no vitals taken for this visit.  PHYSICAL EXAM: Overall appearance:  Healthy appearing, in no distress Head:  Normocephalic, atraumatic. Ears: External auditory canals are clear; tympanic membranes are intact and the middle ears are free of any effusion. Nose: External nose is healthy in appearance. Internal nasal exam free of any lesions or obstruction. Oral Cavity/pharynx:  There are no mucosal lesions or masses identified. Hypopharynx/Larynx: no signs of any mucosal lesions or masses identified. Vocal cords move normally. Neuro:  No identifiable neurologic deficits.  Neck: No palpable neck masses.  Studies Reviewed: none    Assessment/Plan Esophageal stenosis postradiation.  Proceed with esophagoscopy and dilatation.  [name] 05/15/2023, 11:14 AM

## 2023-05-19 ENCOUNTER — Other Ambulatory Visit: Payer: Self-pay

## 2023-05-19 DIAGNOSIS — I77819 Aortic ectasia, unspecified site: Secondary | ICD-10-CM

## 2023-05-25 ENCOUNTER — Ambulatory Visit

## 2023-05-25 DIAGNOSIS — M216X1 Other acquired deformities of right foot: Secondary | ICD-10-CM

## 2023-05-25 DIAGNOSIS — E1151 Type 2 diabetes mellitus with diabetic peripheral angiopathy without gangrene: Secondary | ICD-10-CM

## 2023-05-25 DIAGNOSIS — M2141 Flat foot [pes planus] (acquired), right foot: Secondary | ICD-10-CM | POA: Diagnosis not present

## 2023-05-25 DIAGNOSIS — M216X2 Other acquired deformities of left foot: Secondary | ICD-10-CM | POA: Diagnosis not present

## 2023-05-25 DIAGNOSIS — M2142 Flat foot [pes planus] (acquired), left foot: Secondary | ICD-10-CM | POA: Diagnosis not present

## 2023-05-25 NOTE — Progress Notes (Signed)

## 2023-05-26 ENCOUNTER — Encounter (HOSPITAL_COMMUNITY): Payer: Self-pay | Admitting: Otolaryngology

## 2023-05-26 ENCOUNTER — Other Ambulatory Visit: Payer: Self-pay

## 2023-05-26 NOTE — Anesthesia Preprocedure Evaluation (Signed)
 Anesthesia Evaluation  Patient identified by MRN, date of birth, ID band Patient awake    Reviewed: Allergy & Precautions, NPO status , Patient's Chart, lab work & pertinent test results  Airway Mallampati: III  TM Distance: >3 FB Neck ROM: Full    Dental   Pulmonary sleep apnea and Continuous Positive Airway Pressure Ventilation , former smoker   breath sounds clear to auscultation       Cardiovascular hypertension, Pt. on medications +CHF  + dysrhythmias  Rhythm:Regular Rate:Normal     Neuro/Psych negative neurological ROS     GI/Hepatic negative GI ROS, Neg liver ROS,,,  Endo/Other  diabetes, Type 2    Renal/GU Renal disease     Musculoskeletal  (+) Arthritis ,    Abdominal   Peds  Hematology  (+) Blood dyscrasia, anemia   Anesthesia Other Findings   Reproductive/Obstetrics                             Anesthesia Physical Anesthesia Plan  ASA: 3  Anesthesia Plan: General   Post-op Pain Management: Ofirmev IV (intra-op)*   Induction: Intravenous and Rapid sequence  PONV Risk Score and Plan: 2 and Dexamethasone, Ondansetron and Treatment may vary due to age or medical condition  Airway Management Planned: Oral ETT  Additional Equipment:   Intra-op Plan:   Post-operative Plan: Extubation in OR  Informed Consent: I have reviewed the patients History and Physical, chart, labs and discussed the procedure including the risks, benefits and alternatives for the proposed anesthesia with the patient or authorized representative who has indicated his/her understanding and acceptance.     Dental advisory given  Plan Discussed with: CRNA  Anesthesia Plan Comments: (  )       Anesthesia Quick Evaluation

## 2023-05-26 NOTE — Progress Notes (Signed)
 SDW CALL  Patient was given pre-op instructions over the phone. The opportunity was given for the patient to ask questions. No further questions asked. Patient verbalized understanding of instructions given.   PCP - Paliwal Himanshu,MD Cardiologist - [name] [name],DO- last office visit 04/20/23  PPM/ICD - denies Device Orders -  Rep Notified -   Chest x-ray - CT- 04/28/23 EKG - 04/20/23 Stress Test - 06/28/18-Lexican Myoview ECHO - 03/28/22 Cardiac Cath - .12/31/10  Sleep Study - +OSA CPAP - yes  Fasting Blood Sugar - pt states he rarely checks his blood sugar since he has had a problem with his throat. He checked it once last week and it was in the 140's after he ate. He agrees to check his sugar the morning of surgery. Checks Blood Sugar _____ times a day  Blood Thinner Instructions:na Aspirin Instructions:na  ERAS Protcol -no PRE-SURGERY Ensure or G2-   COVID TEST- na   Anesthesia review: yes- hx CHF,DM,HTN,MI.  Pt last took Ozempic 05/23/23  Patient denies shortness of breath, fever, cough and chest pain over the phone call    Surgical Instructions    Your procedure is scheduled on April 9  Report to [facility] Main Entrance "A" at 0630 A.M., then check in with the Admitting office.  Call this number if you have problems the morning of surgery:  [phone]    Remember:  Do not eat or drink anything after midnight the night before your surgery     Take these medicines the morning of surgery with A SIP OF WATER: allopurinol,coreg,klonopin,gabapentin,omeprazole. PRN- Albuterol inhaler- bring to the hospital   As of today, STOP taking any Aspirin (unless otherwise instructed by your surgeon) Aleve, Naproxen, Ibuprofen, Motrin, Advil, Goody's, BC's, all herbal medications, fish oil, and all vitamins.  WHAT DO I DO ABOUT MY DIABETES MEDICATION?   Do not take oral diabetes medicines (pills) the morning of surgery. Do not take Farxiga the morning of surgery.  Do not  take Ozempic prior to surgery. Pt reports last dose was 05/23/23. Annice Needy, PA notified.   Check your blood sugar the morning of your surgery when you wake up and every 2 hours until you get to the Short Stay unit.  If your blood sugar is less than 70 mg/dL, you will need to treat for low blood sugar: Do not take insulin. Treat a low blood sugar (less than 70 mg/dL) with  cup of clear juice (cranberry or apple), 4 glucose tablets, OR glucose gel. Recheck blood sugar in 15 minutes after treatment (to make sure it is greater than 70 mg/dL). If your blood sugar is not greater than 70 mg/dL on recheck, call [phone] for further instructions. Report your blood sugar to the short stay nurse when you get to Short Stay.   is not responsible for any belongings or valuables. .   Do NOT Smoke (Tobacco/Vaping)  24 hours prior to your procedure  If you use a CPAP at night, you may bring your mask for your overnight stay.   Contacts, glasses, hearing aids, dentures or partials may not be worn into surgery, please bring cases for these belongings   Patients discharged the day of surgery will not be allowed to drive home, and someone needs to stay with them for 24 hours. Pt states he has ride to and from hospital but does not have anyone to stay with him for 24 hours. Informed patient that he should try to find someone to stay with him  and that he needs to let Dr. Lucky Rathke office know that he doesn't have anyone to stay with him. I spoke with Elease Hashimoto in Dr. Lucky Rathke office and informed her of patient's potential discharge issues. Also informed Doug Sou, RN.    Special instructions:    Oral Hygiene is also important to reduce your risk of infection.  Remember - BRUSH YOUR TEETH THE MORNING OF SURGERY WITH YOUR REGULAR TOOTHPASTE   Day of Surgery:  Take a shower the day of or night before with antibacterial soap. Wear Clean/Comfortable clothing the morning of surgery Do not  apply any deodorants/lotions.   Do not wear jewelry or makeup Do not wear lotions, powders, perfumes/colognes, or deodorant. Do not shave 48 hours prior to surgery.  Men may shave face and neck. Do not bring valuables to the hospital. Do not wear nail polish, gel polish, artificial nails, or any other type of covering on natural nails (fingers and toes) If you have artificial nails or gel coating that need to be removed by a nail salon, please have this removed prior to surgery. Artificial nails or gel coating may interfere with anesthesia's ability to adequately monitor your vital signs. Remember to brush your teeth WITH YOUR REGULAR TOOTHPASTE.

## 2023-05-26 NOTE — Progress Notes (Signed)
 Anesthesia Chart Review: Melvin Hudson  Case: [id number] Date/Time: 05/27/23 0815   Procedure: ESOPHAGOSCOPY, WITH DILATION   Anesthesia type: General   Pre-op diagnosis:      History of pharyngeal cancer      Pharyngoesophageal dysphagia   [facility]OM 09 / [facility]   Surgeons: [name], MD       DISCUSSION: Patient is a 68 year old male scheduled for the above procedure.  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  Last evaluated by pulmonology on 04/30/2023 by Rubye Oaks, NP for severe OSA follow-up *(AHI 43 on 12/01/11). He has good compliance with CPAP. She noted pending ENT procedure for dysphagia. One year follow-up planned.  Last cardiology evaluation was on 04/20/2023 with Dr. Odis Hollingshead for HFpEF follow-up. Euvolemic at that time. Some adjustments made in GDMT due to soft BP readiings. No new testing ordered.  63-month follow-up planned.  He reported last Ozempic is 05/23/2023. Case is posted for general anesthesia.  Discussed with anesthesiologist Marcene Duos, MD. Anesthesia team to evaluate on the day of surgery.     VS:  BP Readings from Last 3 Encounters:  04/20/23 100/66  03/02/23 112/78  03/01/23 134/81   Pulse Readings from Last 3 Encounters:  04/20/23 77  03/02/23 87  03/01/23 84     PROVIDERS: Sharmon Revere, MD is PCP Tessa Lerner, DO is cardiologist Rubye Oaks, NP is pulmonology provider Charlott Rakes, MD is GI Lonie Peak, MD is RAD-ONC   LABS: He is for updated labs on arrival.  Most recent results in [facility] include: Lab Results  Component Value Date   WBC 9.8 04/18/2022   HGB 8.3 (L) 04/18/2022   HCT 24.4 (L) 04/18/2022   PLT 224 04/18/2022   GLUCOSE 90 04/27/2023   CHOL 85 (L) 04/27/2023   TRIG 64 04/27/2023   HDL 33 (L) 04/27/2023   LDLDIRECT 39 04/27/2023   LDLCALC 37 04/27/2023   ALT 10  04/27/2023   AST 15 04/27/2023   NA 139 04/27/2023   K 4.4 04/27/2023   CL 100 04/27/2023   CREATININE 0.92 04/27/2023   BUN 6 (L) 04/27/2023   CO2 24 04/27/2023   TSH 1.160 04/16/2018   INR 1.3 (H) 04/16/2022   HGBA1C 4.9 04/13/2022     IMAGES: CT Chest 04/28/2023: IMPRESSION: 1. Marked esophageal wall thickening of the included cervical and upper thoracic esophagus, extending from thoracic inlet to the T5 level. Recommend endoscopy for further assessment. If there is history of radiation this may represent post radiation change, although neoplasm could have a similar appearance. 2. Ascending aorta is upper normal at 4 cm. Junction of the transverse and descending aorta is dilated at 4.1 cm. Recommend annual imaging followup by CTA or MRA. This recommendation follows 2010 ACCF/AHA/AATS/ACR/ASA/SCA/SCAI/SIR/STS/SVM Guidelines for the Diagnosis and Management of Patients with Thoracic Aortic Disease. Circulation. 2010; 121: K440-N027. Aortic aneurysm NOS (ICD10-I71.9) 3. Stable right upper lobe pulmonary nodule, unchanged from 2021 exam considered benign.  - Aortic Atherosclerosis (ICD10-I70.0) and Emphysema (ICD10-J43.9).   EKG: 04/20/2023: Sinus rhythm with 1st degree A-V block Premature ventricular complexes Right bundle branch block When compared with ECG of 16-Apr-2022 14:18, Nonspecific T wave abnormality has replaced inverted T waves in Inferior leads Confirmed by Tessa Lerner [phone]) on 04/20/2023 9:27:09 AM   CV: Echocardiogram 03/28/2022: Left ventricle cavity is normal in size. Mild eccentric hypertrophy of the left ventricle. Normal global wall motion.  Normal LV systolic function with visual EF 50-55%. Doppler evidence of grade I (impaired) diastolic dysfunction, normal LAP. The aortic root is mildly dilated at 3.7 cm. Mildly dilated ascending aorta at 4.0 cm. Ascending aorta was not well visualized on previous study in 2020. LA was reported mildly dilated then.     Lexiscan Myoview stress test 06/28/2018:  Low risk study   Coronary Angiogram 12/31/2010: Normal LVEF. Slow flow in coronary arteries without stenosis. Mild pulmonary HTN.      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    Past Surgical History:  Procedure Laterality Date   COLONOSCOPY Left 04/11/2014   Procedure: COLONOSCOPY;  Surgeon: Willis Modena, MD;  Location: [facility] ENDOSCOPY;  Service: Endoscopy;  Laterality: Left;   COLONOSCOPY N/A 04/20/2014   Procedure: COLONOSCOPY;  Surgeon: Shirley Friar, MD;  Location: [facility] ENDOSCOPY;  Service: Endoscopy;  Laterality: N/A;   COLONOSCOPY W/ BIOPSIES AND POLYPECTOMY     benign   DIRECT LARYNGOSCOPY  01/05/2014   DIRECT LARYNGOSCOPY N/A 01/05/2014   Procedure: DIRECT LARYNGOSCOPY AND BIOPSY;  Surgeon: [name], MD;  Location: [facility] OR;  Service: ENT;  Laterality: N/A;   KNEE ARTHROSCOPY Right ~ 1994   LEFT AND RIGHT HEART CATHETERIZATION WITH CORONARY/GRAFT ANGIOGRAM N/A 12/31/2010   Procedure: LEFT AND RIGHT HEART CATHETERIZATION WITH Isabel Caprice;  Surgeon: Pamella Pert, MD;  Location: [facility] CATH LAB;  Service: Cardiovascular;  Laterality: N/A;   MULTIPLE EXTRACTIONS WITH ALVEOLOPLASTY N/A 07/08/2013   Procedure: Extraction of tooth #'s 22, 27 with alveoloplasty and bilateal mandibular facial exostoses reductions;  Surgeon: Charlynne Pander, DDS;  Location: [facility] ORS;  Service: Oral Surgery;  Laterality: N/A;   MULTIPLE TOOTH EXTRACTIONS  ~ 2008     MEDICATIONS: No current facility-administered medications for this encounter.    albuterol (VENTOLIN HFA) 108 (90 Base) MCG/ACT inhaler   allopurinol (ZYLOPRIM) 300 MG tablet   atorvastatin (LIPITOR) 40 MG tablet   calcium carbonate (TUMS - DOSED IN MG ELEMENTAL CALCIUM) 500 MG chewable tablet   carvedilol (COREG) 25 MG tablet   clonazePAM (KLONOPIN) 1 MG disintegrating tablet   dapagliflozin propanediol (FARXIGA) 10 MG TABS tablet   diphenhydrAMINE (BENADRYL) 25 MG tablet   gabapentin (NEURONTIN) 300 MG capsule   hydrocortisone (ANUSOL-HC) 2.5 % rectal cream   isosorbide-hydrALAZINE (BIDIL) 20-37.5 MG tablet   NIFEdipine (PROCARDIA XL/NIFEDICAL-XL) 90 MG 24 hr tablet   omeprazole (PRILOSEC) 20 MG capsule   OZEMPIC, 0.25 OR 0.5 MG/DOSE, 2 MG/1.5ML SOPN   polyethylene glycol (MIRALAX / GLYCOLAX) 17 g packet   psyllium (HYDROCIL/METAMUCIL) 95 % PACK   senna-docusate (SENOKOT-S) 8.6-50 MG tablet   spironolactone (ALDACTONE) 25 MG tablet   Blood Glucose Monitoring Suppl (ONETOUCH VERIO FLEX SYSTEM) w/Device KIT   ONETOUCH VERIO test strip    Shonna Chock, PA-C Surgical Short Stay/Anesthesiology [facility] Phone [phone] [facility] Phone [phone] 05/26/2023 2:26 PM

## 2023-05-27 ENCOUNTER — Ambulatory Visit (HOSPITAL_COMMUNITY): Admitting: Vascular Surgery

## 2023-05-27 ENCOUNTER — Encounter (HOSPITAL_COMMUNITY): Payer: Self-pay | Admitting: Otolaryngology

## 2023-05-27 ENCOUNTER — Encounter (HOSPITAL_COMMUNITY)
Admission: RE | Disposition: A | Discharge status: Home / Self Care | Payer: Self-pay | Source: Home / Self Care | Attending: Otolaryngology

## 2023-05-27 ENCOUNTER — Ambulatory Visit (HOSPITAL_COMMUNITY)
Admission: RE | Admit: 2023-05-27 | Discharge: 2023-05-27 | Disposition: A | Discharge status: Home / Self Care | Payer: 59 | Attending: Otolaryngology | Admitting: Otolaryngology

## 2023-05-27 ENCOUNTER — Ambulatory Visit (HOSPITAL_BASED_OUTPATIENT_CLINIC_OR_DEPARTMENT_OTHER): Admitting: Vascular Surgery

## 2023-05-27 ENCOUNTER — Other Ambulatory Visit: Payer: Self-pay

## 2023-05-27 DIAGNOSIS — I110 Hypertensive heart disease with heart failure: Secondary | ICD-10-CM

## 2023-05-27 DIAGNOSIS — E119 Type 2 diabetes mellitus without complications: Secondary | ICD-10-CM | POA: Diagnosis not present

## 2023-05-27 DIAGNOSIS — I5032 Chronic diastolic (congestive) heart failure: Secondary | ICD-10-CM | POA: Diagnosis not present

## 2023-05-27 DIAGNOSIS — I700 Atherosclerosis of aorta: Secondary | ICD-10-CM | POA: Diagnosis not present

## 2023-05-27 DIAGNOSIS — C153 Malignant neoplasm of upper third of esophagus: Secondary | ICD-10-CM | POA: Insufficient documentation

## 2023-05-27 DIAGNOSIS — E669 Obesity, unspecified: Secondary | ICD-10-CM | POA: Diagnosis not present

## 2023-05-27 DIAGNOSIS — Z923 Personal history of irradiation: Secondary | ICD-10-CM | POA: Diagnosis not present

## 2023-05-27 DIAGNOSIS — G4733 Obstructive sleep apnea (adult) (pediatric): Secondary | ICD-10-CM | POA: Insufficient documentation

## 2023-05-27 DIAGNOSIS — I509 Heart failure, unspecified: Secondary | ICD-10-CM | POA: Diagnosis not present

## 2023-05-27 DIAGNOSIS — Z87891 Personal history of nicotine dependence: Secondary | ICD-10-CM | POA: Insufficient documentation

## 2023-05-27 DIAGNOSIS — I493 Ventricular premature depolarization: Secondary | ICD-10-CM | POA: Diagnosis not present

## 2023-05-27 DIAGNOSIS — Z6832 Body mass index (BMI) 32.0-32.9, adult: Secondary | ICD-10-CM | POA: Diagnosis not present

## 2023-05-27 DIAGNOSIS — J439 Emphysema, unspecified: Secondary | ICD-10-CM | POA: Insufficient documentation

## 2023-05-27 DIAGNOSIS — Z85818 Personal history of malignant neoplasm of other sites of lip, oral cavity, and pharynx: Secondary | ICD-10-CM | POA: Insufficient documentation

## 2023-05-27 DIAGNOSIS — K222 Esophageal obstruction: Secondary | ICD-10-CM | POA: Diagnosis not present

## 2023-05-27 DIAGNOSIS — R1314 Dysphagia, pharyngoesophageal phase: Secondary | ICD-10-CM | POA: Diagnosis not present

## 2023-05-27 DIAGNOSIS — I272 Pulmonary hypertension, unspecified: Secondary | ICD-10-CM | POA: Diagnosis not present

## 2023-05-27 HISTORY — PX: ESOPHAGOSCOPY WITH DILITATION: SHX5618

## 2023-05-27 LAB — CBC
HCT: 41.9 % (ref 39.0–52.0)
Hemoglobin: 14.6 g/dL (ref 13.0–17.0)
MCH: 29.6 pg (ref 26.0–34.0)
MCHC: 34.8 g/dL (ref 30.0–36.0)
MCV: 85 fL (ref 80.0–100.0)
Platelets: 330 10*3/uL (ref 150–400)
RBC: 4.93 MIL/uL (ref 4.22–5.81)
RDW: 19.2 % — ABNORMAL HIGH (ref 11.5–15.5)
WBC: 9.3 10*3/uL (ref 4.0–10.5)
nRBC: 0 % (ref 0.0–0.2)

## 2023-05-27 LAB — BASIC METABOLIC PANEL WITH GFR
Anion gap: 11 (ref 5–15)
BUN: 6 mg/dL — ABNORMAL LOW (ref 8–23)
CO2: 25 mmol/L (ref 22–32)
Calcium: 9.2 mg/dL (ref 8.9–10.3)
Chloride: 102 mmol/L (ref 98–111)
Creatinine, Ser: 0.9 mg/dL (ref 0.61–1.24)
GFR, Estimated: >60 mL/min (ref >60)
Glucose, Bld: 88 mg/dL (ref 70–99)
Potassium: 4 mmol/L (ref 3.5–5.1)
Sodium: 138 mmol/L (ref 135–145)

## 2023-05-27 LAB — GLUCOSE, CAPILLARY
Glucose-Capillary: 88 mg/dL (ref 70–99)
Glucose-Capillary: 93 mg/dL (ref 70–99)

## 2023-05-27 SURGERY — ESOPHAGOSCOPY, WITH DILATION
Anesthesia: General

## 2023-05-27 MED ORDER — LIDOCAINE 2% (20 MG/ML) 5 ML SYRINGE
INTRAMUSCULAR | Status: AC
  Filled 2023-05-27: qty 5

## 2023-05-27 MED ORDER — DEXAMETHASONE SODIUM PHOSPHATE 10 MG/ML IJ SOLN
INTRAMUSCULAR | Status: AC
  Filled 2023-05-27: qty 1

## 2023-05-27 MED ORDER — FENTANYL CITRATE (PF) 250 MCG/5ML IJ SOLN
INTRAMUSCULAR | Status: AC
  Filled 2023-05-27: qty 5

## 2023-05-27 MED ORDER — ORAL CARE MOUTH RINSE: 15.0000 mL | Freq: Once | OROMUCOSAL | Status: AC

## 2023-05-27 MED ORDER — PHENYLEPHRINE 80 MCG/ML (10ML) SYRINGE FOR IV PUSH (FOR BLOOD PRESSURE SUPPORT)
PREFILLED_SYRINGE | INTRAVENOUS | Status: AC
Start: 2023-05-27 — End: ?
  Filled 2023-05-27: qty 10

## 2023-05-27 MED ORDER — LACTATED RINGERS IV SOLN: INTRAVENOUS | Status: DC

## 2023-05-27 MED ORDER — CHLORHEXIDINE GLUCONATE 0.12 % MT SOLN
15.0000 mL | Freq: Once | OROMUCOSAL | Status: AC
  Administered 2023-05-27: 15 mL via OROMUCOSAL
  Filled 2023-05-27: qty 15

## 2023-05-27 MED ORDER — SUCCINYLCHOLINE CHLORIDE 200 MG/10ML IV SOSY
PREFILLED_SYRINGE | INTRAVENOUS | Status: AC
  Filled 2023-05-27: qty 10

## 2023-05-27 MED ORDER — SUGAMMADEX SODIUM 200 MG/2ML IV SOLN
INTRAVENOUS | Status: DC | PRN
  Administered 2023-05-27: 400 mg via INTRAVENOUS

## 2023-05-27 MED ORDER — ONDANSETRON HCL 4 MG/2ML IJ SOLN
INTRAMUSCULAR | Status: DC | PRN
  Administered 2023-05-27: 4 mg via INTRAVENOUS

## 2023-05-27 MED ORDER — ACETAMINOPHEN 10 MG/ML IV SOLN
INTRAVENOUS | Status: DC | PRN
  Administered 2023-05-27: 1000 mg via INTRAVENOUS

## 2023-05-27 MED ORDER — ONDANSETRON HCL 4 MG/2ML IJ SOLN
INTRAMUSCULAR | Status: AC
  Filled 2023-05-27: qty 2

## 2023-05-27 MED ORDER — DROPERIDOL 2.5 MG/ML IJ SOLN: 0.6250 mg | Freq: Once | INTRAMUSCULAR | Status: DC | PRN

## 2023-05-27 MED ORDER — PROPOFOL 10 MG/ML IV BOLUS
INTRAVENOUS | Status: AC
  Filled 2023-05-27: qty 20

## 2023-05-27 MED ORDER — INSULIN ASPART 100 UNIT/ML IJ SOLN: 0.0000 [IU] | INTRAMUSCULAR | Status: DC | PRN

## 2023-05-27 MED ORDER — EPINEPHRINE HCL (NASAL) 0.1 % NA SOLN
NASAL | Status: AC
  Filled 2023-05-27: qty 30

## 2023-05-27 MED ORDER — PROPOFOL 10 MG/ML IV BOLUS
INTRAVENOUS | Status: DC | PRN
  Administered 2023-05-27: 200 mg via INTRAVENOUS

## 2023-05-27 MED ORDER — FENTANYL CITRATE (PF) 250 MCG/5ML IJ SOLN
INTRAMUSCULAR | Status: DC | PRN
  Administered 2023-05-27 (×2): 50 ug via INTRAVENOUS

## 2023-05-27 MED ORDER — SUCCINYLCHOLINE CHLORIDE 200 MG/10ML IV SOSY
PREFILLED_SYRINGE | INTRAVENOUS | Status: DC | PRN
  Administered 2023-05-27: 140 mg via INTRAVENOUS

## 2023-05-27 MED ORDER — PHENYLEPHRINE HCL-NACL 20-0.9 MG/250ML-% IV SOLN
INTRAVENOUS | Status: DC | PRN
  Administered 2023-05-27: 40 ug via INTRAVENOUS

## 2023-05-27 MED ORDER — ACETAMINOPHEN 10 MG/ML IV SOLN
INTRAVENOUS | Status: AC
  Filled 2023-05-27: qty 100

## 2023-05-27 MED ORDER — FENTANYL CITRATE (PF) 100 MCG/2ML IJ SOLN: 25.0000 ug | INTRAMUSCULAR | Status: DC | PRN

## 2023-05-27 MED ORDER — LIDOCAINE 2% (20 MG/ML) 5 ML SYRINGE
INTRAMUSCULAR | Status: DC | PRN
Start: 2023-05-27 — End: 2023-05-27
  Administered 2023-05-27: 40 mg via INTRAVENOUS

## 2023-05-27 MED ORDER — ROCURONIUM BROMIDE 10 MG/ML (PF) SYRINGE
PREFILLED_SYRINGE | INTRAVENOUS | Status: AC
  Filled 2023-05-27: qty 10

## 2023-05-27 MED ORDER — ROCURONIUM BROMIDE 10 MG/ML (PF) SYRINGE
PREFILLED_SYRINGE | INTRAVENOUS | Status: DC | PRN
  Administered 2023-05-27: 40 mg via INTRAVENOUS

## 2023-05-27 MED ORDER — DEXAMETHASONE SODIUM PHOSPHATE 4 MG/ML IJ SOLN
INTRAMUSCULAR | Status: DC | PRN
  Administered 2023-05-27: 5 mg via INTRAVENOUS

## 2023-05-27 MED ORDER — ACETAMINOPHEN 500 MG PO TABS
1000.0000 mg | ORAL_TABLET | Freq: Once | ORAL | Status: DC
  Filled 2023-05-27: qty 2

## 2023-05-27 SURGICAL SUPPLY — 24 items
BAG COUNTER SPONGE SURGICOUNT (BAG) ×1 IMPLANT
BALLN PULM 15 16.5 18X75 (BALLOONS) ×1 IMPLANT
BALLOON PULM 15 16.5 18X75 (BALLOONS) ×1 IMPLANT
BLADE SURG 15 STRL LF DISP TIS (BLADE) IMPLANT
CANISTER SUCT 3000ML PPV (MISCELLANEOUS) ×1 IMPLANT
CNTNR URN SCR LID CUP LEK RST (MISCELLANEOUS) IMPLANT
COVER BACK TABLE 60X90IN (DRAPES) ×1 IMPLANT
DRAPE HALF SHEET 40X57 (DRAPES) ×1 IMPLANT
GAUZE SPONGE 4X4 12PLY STRL (GAUZE/BANDAGES/DRESSINGS) IMPLANT
GLOVE BIO SURGEON STRL SZ7.5 (GLOVE) ×1 IMPLANT
GUARD TEETH (MISCELLANEOUS) IMPLANT
KIT BASIN OR (CUSTOM PROCEDURE TRAY) ×1 IMPLANT
KIT TURNOVER KIT B (KITS) ×1 IMPLANT
NDL PRECISIONGLIDE 27X1.5 (NEEDLE) IMPLANT
NEEDLE PRECISIONGLIDE 27X1.5 (NEEDLE) IMPLANT
NS IRRIG 1000ML POUR BTL (IV SOLUTION) ×1 IMPLANT
PAD ARMBOARD POSITIONER FOAM (MISCELLANEOUS) ×2 IMPLANT
PATTIES SURGICAL .5 X3 (DISPOSABLE) IMPLANT
POSITIONER HEAD DONUT 9IN (MISCELLANEOUS) IMPLANT
SOL ANTI FOG 6CC (MISCELLANEOUS) IMPLANT
SURGILUBE 2OZ TUBE FLIPTOP (MISCELLANEOUS) ×1 IMPLANT
TOWEL GREEN STERILE FF (TOWEL DISPOSABLE) ×2 IMPLANT
TUBE CONNECTING 12X1/4 (SUCTIONS) ×1 IMPLANT
WATER STERILE IRR 1000ML POUR (IV SOLUTION) ×1 IMPLANT

## 2023-05-27 NOTE — Interval H&P Note (Signed)
 History and Physical Interval Note:  05/27/2023 8:04 AM  Melvin Hudson  has presented today for surgery, with the diagnosis of History of pharyngeal cancer  Pharyngoesophageal dysphagia.  The various methods of treatment have been discussed with the patient and family. After consideration of risks, benefits and other options for treatment, the patient has consented to  Procedure(s): ESOPHAGOSCOPY, WITH DILATION (N/A) as a surgical intervention.  The patient's history has been reviewed, patient examined, no change in status, stable for surgery.  I have reviewed the patient's chart and labs.  Questions were answered to the patient's satisfaction.     [name]

## 2023-05-27 NOTE — Discharge Instructions (Signed)
 Resume diet as tolerated.  Dr. Pollyann Kennedy will call with the biopsy results when available in a few days.

## 2023-05-27 NOTE — Transfer of Care (Signed)
 Immediate Anesthesia Transfer of Care Note  Patient: Melvin Hudson  Procedure(s) Performed: ESOPHAGOSCOPY  Patient Location: PACU  Anesthesia Type:General  Level of Consciousness: awake, alert , and patient cooperative  Airway & Oxygen Therapy: Patient Spontanous Breathing and Patient connected to face mask oxygen  Post-op Assessment: Report given to RN and Post -op Vital signs reviewed and stable  Post vital signs: Reviewed and stable  Last Vitals:  Vitals Value Taken Time  BP 124/77 05/27/23 0857  Temp    Pulse 76 05/27/23 0900  Resp 18 05/27/23 0900  SpO2 99 % 05/27/23 0900  Vitals shown include unfiled device data.  Last Pain:  Vitals:   05/27/23 0702  PainSc: 0-No pain      Patients Stated Pain Goal: 0 (05/27/23 0702)  Complications: No notable events documented.

## 2023-05-27 NOTE — Anesthesia Procedure Notes (Addendum)
 Procedure Name: Intubation Date/Time: 05/27/2023 8:29 AM  Performed by: Carolynne Edouard, RNPre-anesthesia Checklist: Patient identified, Emergency Drugs available, Suction available and Patient being monitored Patient Re-evaluated:Patient Re-evaluated prior to induction Oxygen Delivery Method: Circle system utilized Preoxygenation: Pre-oxygenation with 100% oxygen Induction Type: IV induction Laryngoscope Size: 4 and Glidescope Grade View: Grade I Tube type: Oral Tube size: 7.5 mm Number of attempts: 1 Airway Equipment and Method: Stylet and Oral airway Placement Confirmation: ETT inserted through vocal cords under direct vision, positive ETCO2 and breath sounds checked- equal and bilateral Secured at: 22 cm Tube secured with: Tape Dental Injury: Teeth and Oropharynx as per pre-operative assessment  Comments: RSI; Initial attempt via DL with poor view; Glidescope used with Grade I view and successful ETT placement; Atraumatic

## 2023-05-27 NOTE — Op Note (Signed)
 OPERATIVE REPORT  DATE OF SURGERY: 05/27/2023  PATIENT:  Melvin Hudson,  68 y.o. male  PRE-OPERATIVE DIAGNOSIS:  History of pharyngeal cancer  Pharyngoesophageal dysphagia, esophageal stricture  POST-OPERATIVE DIAGNOSIS:  History of pharyngeal cancer Pharyngoesophageal dysphagia, esophageal mass  PROCEDURE:  Procedure(s): ESOPHAGOSCOPY, WITH biopsy  SURGEON:  Susy Frizzle, MD  ASSISTANTS: None  ANESTHESIA:   General   EBL: Less than 10 ml  DRAINS: None  LOCAL MEDICATIONS USED:  None  SPECIMEN: Biopsy, proximal esophagus  COUNTS:  Correct  PROCEDURE DETAILS: The patient was taken to the operating room and placed on the operating table in the supine position. Following induction of general endotracheal anesthesia, the table was turned 90 degrees and the patient was draped in the standard fashion for upper endoscopy.  A rigid cervical esophagoscope was entered into the oral cavity passed down to the esophageal introitus and entered into the upper cervical esophagus.  Just beneath the introitus approximately 2 or 3 cm below a tumor was encountered.  The seem to arise from the right posterolateral wall.  There was obvious obstruction of the esophageal lumen.  The mass was hard and friable on the surface.  Multiple biopsies were taken.  The scope was removed.  Patient was awakened extubated and transferred to recovery in stable condition.    PATIENT DISPOSITION:  To PACU, stable

## 2023-05-28 ENCOUNTER — Encounter (HOSPITAL_COMMUNITY): Payer: Self-pay | Admitting: Otolaryngology

## 2023-05-28 LAB — SURGICAL PATHOLOGY

## 2023-05-28 NOTE — Anesthesia Postprocedure Evaluation (Signed)
 Anesthesia Post Note  Patient: Melvin Hudson  Procedure(s) Performed: ESOPHAGOSCOPY     Patient location during evaluation: PACU Anesthesia Type: General Level of consciousness: awake and alert Pain management: pain level controlled Vital Signs Assessment: post-procedure vital signs reviewed and stable Respiratory status: spontaneous breathing, nonlabored ventilation, respiratory function stable and patient connected to nasal cannula oxygen Cardiovascular status: stable and blood pressure returned to baseline Postop Assessment: no apparent nausea or vomiting Anesthetic complications: no   No notable events documented.  Last Vitals:  Vitals:   05/27/23 0900 05/27/23 0915  BP: 128/86 117/84  Pulse: 71 76  Resp: 19 16  Temp: 36.6 C   SpO2: 100% 95%    Last Pain:  Vitals:   05/27/23 0930  PainSc: 0-No pain                 Kennieth Rad

## 2023-06-01 NOTE — Progress Notes (Signed)
 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 Says at one point he was having trouble swallowing and could only eat soups.  04/28/2023 CT Chest WO Contrast  Biopsies revealed:    Nutrition Status Yes No Comments  Weight changes? [x]  []  Patient lost 60 pounds over the past 4-5 months. Was having trouble swallowing and would go to urgent care to have a steroid so it could be more easier for him to eat.  Swallowing concerns? [x]  []  Patient is unable to swallow pills, now just eats soups. More difficult for him to eat solid foods.  PEG? []  [x]     Referrals Yes No Comments  Social Work? [x]  []    Dentistry? [x]  []    Swallowing therapy? [x]  []    Nutrition? [x]  []    Med/Onc? [x]  []     Safety Issues Yes No Comments  Prior radiation? [x]  []    Pacemaker/ICD? []  [x]    Possible current pregnancy? []  [x]    Is the patient on methotrexate? []  [x]     Tobacco/Marijuana/Snuff/ETOH use:  05/15/2023 Donalee Fruits, MD Patient reports he quit smoking 9 years ago. His smoking use included cigarettes and cigars. He started smoking about 51 years ago. He has a 84 pack year smoking history.  Past/Anticipated interventions by otolaryngology, if any:  05/27/2023 Dr. Donalee Fruits  Esophagoscopy with Dilation  Past/Anticipated interventions by medical oncology, if any:  Radiation Therapy    Current Complaints / other details:   None Wt Readings from Last 3 Encounters:  06/08/23 248 lb 3.2 oz (112.6 kg)  05/27/23 252 lb (114.3 kg)  04/20/23 267 lb 6.4 oz (121.3 kg)

## 2023-06-06 NOTE — Progress Notes (Signed)
 Radiation Oncology         [phone] ________________________________  Initial Outpatient Consultation  Name: Melvin Hudson MRN: [id number]  Date: 06/08/2023  DOB: 1955/08/07  MV:HQIONGE, Darnelle Elders, MD  Janita Mellow, MD   REFERRING PHYSICIAN: Janita Mellow, MD  DIAGNOSIS: No diagnosis found.   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 male who is known to me for his history of tonsillar cancer diagnosed in 2015, s/p tonsillectomy followed by adjuvant radiation therapy (detailed below). He was last seen for routine follow-up by myself in February of 2020, and has continued to meet with Dr. Donalee Fruits for routine surveillance visits since that time. He had remained without recurrent disease until his present history, which is detailed as follows.   The patient initially presented to Dr. Donalee Fruits on 11/18/22 with c/o some mouth a throat irritation which he attributed to his CPAP not being humidified properly. Fiberoptic and physical examinations performed at that time showed NED.   Despite fixing the humidity in his CPAP, he developed increasing dysphagia which led to him presenting to an UC facility on 01/19/23. He was given a decadron  injection for management and was instructed to follow up with ENT for further management. Per encounter notes, he had a CT of neck performed around that time which showed an abnormal finding, however there is not record of this report on his EMR.   He then returned to the ED on 03/01/23 with c/o persistent dysphagia which had  progressed to the point where he had trouble swallowing his pills that morning, as well as some regurgitation. Given that his was overall well-appearing by the time of his ED evaluation, admission was deferred and was instructed to follow-up with Dr. Donalee Fruits for further management.  (He was given another injection of decadron  for short term management).   He accordingly followed up with Dr. Donalee Fruits and a barium swallow study was performed on 03/05/23 which demonstrated moderate persistent narrowing of the esophagus at the cervicothoracic junction. It was reportedly unclear if this was due to external compression, stricture, or mass.   It is unclear why there was such a delay in the patient's work-up. He however eventually presented for a chest CT on 04/28/23 (which was also indicated by a history of HFpEF and hyperlipidemia) which demonstrated marked esophageal wall thickening of the included cervical and upper thoracic esophagus, extending from the thoracic inlet to the T5 level. 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   Based on his persistent symptoms and imaging findings, an EGD was performed on 05/27/23 under Dr. Donalee Fruits. Procedural findings revealed a mass located approximately 2-3 cm beneath the introitus arising from the posterolateral wall, resulting in obvious obstruction of the esophageal lumen. Biopsies of the proximal esophagus were accordingly collected at that time which unfortunately showed findings consistent with squamous cell carcinoma.   Swallowing issues, if any: progressive  dysphagia   Weight Changes: ***  Pain status: related to dysphagia  Other symptoms: ***  Tobacco history, if any: former smoker, quit in 2015 with 80+ pack year smoking history (participated in  the lung cancer screening program)  ETOH abuse, if any: none  Prior cancers, if any: Yes as noted below  PREVIOUS RADIATION THERAPY: Yes   Diagnosis:   T3N2bM0 Stage IVA Right tonsil squamous cell carcinoma    Indication for treatment:  curative      Radiation treatment dates:   07/26/2013-09/15/2013 Site/dose:   Right tonsil and bilateral neck / 70 Gy in 35 fractions to gross disease, 63 Gy in 35 fractions to high risk nodal echelons, and 56 Gy in 35 fractions to intermediate risk nodal echelons Beams/energy:   Helical IMRT / 6 MV photons  PAST MEDICAL HISTORY:  has a past medical history of Angioedema (09/17/11), Arthritis, CHF (congestive heart failure) (HCC), Diabetes mellitus without complication (HCC), Erectile dysfunction (04/20/2018), GI bleed, Heart murmur (04/20/2018), gout, Hypertension, Obesity, OSA on CPAP, Pure hypercholesterolemia (04/20/2018), S/P radiation therapy (07/26/2013-09/15/2013), and Squamous cell carcinoma of right tonsil (HCC) (06/14/13).    PAST SURGICAL HISTORY: Past Surgical History:  Procedure Laterality Date   COLONOSCOPY Left 04/11/2014   Procedure: COLONOSCOPY;  Surgeon: Evangeline Hilts, MD;  Location: [facility] ENDOSCOPY;  Service: Endoscopy;  Laterality: Left;   COLONOSCOPY N/A 04/20/2014   Procedure: COLONOSCOPY;  Surgeon: Yvetta Herbert, MD;  Location: [facility] ENDOSCOPY;  Service: Endoscopy;  Laterality: N/A;   COLONOSCOPY W/ BIOPSIES AND POLYPECTOMY     benign   DIRECT LARYNGOSCOPY  01/05/2014   DIRECT LARYNGOSCOPY N/A 01/05/2014   Procedure: DIRECT LARYNGOSCOPY AND BIOPSY;  Surgeon: Janita Mellow, MD;  Location: [facility] OR;  Service: ENT;  Laterality: N/A;   ESOPHAGOSCOPY WITH DILITATION N/A 05/27/2023   Procedure: ESOPHAGOSCOPY;  Surgeon: Janita Mellow, MD;  Location: [facility] OR;  Service: ENT;  Laterality: N/A;   KNEE ARTHROSCOPY Right ~ 1994   LEFT AND RIGHT HEART CATHETERIZATION WITH CORONARY/GRAFT ANGIOGRAM N/A 12/31/2010   Procedure: LEFT AND RIGHT HEART CATHETERIZATION WITH  Estella Helling;  Surgeon: Jessica Morn, MD;  Location: [facility] CATH LAB;  Service: Cardiovascular;  Laterality: N/A;   MULTIPLE EXTRACTIONS WITH ALVEOLOPLASTY N/A 07/08/2013   Procedure: Extraction of tooth #'s 22, 27 with alveoloplasty and bilateal mandibular facial exostoses reductions;  Surgeon: Carol Chroman, DDS;  Location: [facility] ORS;  Service: Oral Surgery;  Laterality: N/A;   MULTIPLE TOOTH EXTRACTIONS  ~ 2008    FAMILY HISTORY: family history includes Aneurysm in his father; Cancer in his mother and sister; Diabetes in his father; Hypertension in his mother; Stroke in his mother.  SOCIAL HISTORY:  reports that he quit smoking about 9 years ago. His smoking use included cigarettes and cigars. He started smoking about 52 years ago. He has a 84 pack-year smoking history. He has never used smokeless tobacco. He reports that he does not drink alcohol  and does not use drugs.  ALLERGIES: Azilsartan and Advil [ibuprofen]  MEDICATIONS:  Current Outpatient Medications  Medication Sig Dispense Refill   albuterol  (VENTOLIN  HFA) 108 (90 Base) MCG/ACT inhaler Inhale 2 puffs into the lungs every 4 (four) hours as needed for wheezing (Cough).     allopurinol  (ZYLOPRIM ) 300 MG tablet Take 300 mg by mouth daily.     atorvastatin  (LIPITOR) 40 MG tablet Take 1 tablet (40 mg total) by mouth at bedtime. 90 tablet 1   Blood Glucose Monitoring Suppl (ONETOUCH VERIO FLEX SYSTEM) w/Device KIT USE  TO CHECK BLOOD SUGAR TWICE DAILY     calcium  carbonate (TUMS - DOSED IN MG ELEMENTAL CALCIUM ) 500 MG chewable tablet Chew 2 tablets (400 mg of elemental calcium  total) by mouth 3 (three) times daily with meals. (Patient taking differently: Chew 400 mg of elemental calcium  by mouth daily as needed.) 180 tablet 1   carvedilol  (COREG ) 25 MG tablet Take 25 mg by mouth 2 (two) times daily with a meal.     clonazePAM  (KLONOPIN ) 1 MG disintegrating tablet Take 1 mg by mouth daily.     dapagliflozin  propanediol  (FARXIGA ) 10 MG TABS tablet Take 10 mg by mouth daily.     diphenhydrAMINE  (BENADRYL ) 25 MG tablet Take 1 tablet (25 mg total) by mouth every 6 (six) hours. (Patient taking differently: Take 25 mg by mouth every 6 (six) hours as needed for allergies or sleep.) 20 tablet 0   gabapentin  (NEURONTIN ) 300 MG capsule Take 300 mg by mouth 3 (three) times daily.   2   hydrocortisone  (ANUSOL -HC) 2.5 % rectal cream Place 1 Application rectally daily as needed for hemorrhoids or anal itching.     isosorbide -hydrALAZINE  (BIDIL ) 20-37.5 MG tablet Take 1 tablet by mouth in the morning and at bedtime. 90 tablet 3   NIFEdipine  (PROCARDIA  XL/NIFEDICAL-XL) 90 MG 24 hr tablet Take 90 mg by mouth at bedtime.     omeprazole  (PRILOSEC) 20 MG capsule Take 20 mg by mouth daily.   4   ONETOUCH VERIO test strip USE TO CHECK BLOOD SUGAR TWICE DAILY     OZEMPIC , 0.25 OR 0.5 MG/DOSE, 2 MG/1.5ML SOPN Inject 0.5 mg into the skin once a week. Sunday's     polyethylene glycol (MIRALAX  / GLYCOLAX ) 17 g packet Take 17 g by mouth daily. (Patient taking differently: Take 17 g by mouth daily as needed.) 14 each 0   psyllium (HYDROCIL/METAMUCIL) 95 % PACK Take 1 packet by mouth daily. (Patient taking differently: Take 1 packet by mouth daily as needed for mild constipation or moderate constipation.) 240 each 0   senna-docusate (SENOKOT-S) 8.6-50 MG tablet Take 1 tablet by mouth 2 (two) times daily. (Patient taking differently: Take 1 tablet by mouth daily as needed.) 60 tablet 1   spironolactone  (ALDACTONE ) 25 MG tablet Take 1 tablet (25 mg total) by mouth in the morning. 90 tablet 3   No current facility-administered medications for this encounter.    REVIEW OF SYSTEMS:  Notable for that above.   PHYSICAL EXAM:  vitals were not taken for this visit.   General: Alert and oriented, in no acute distress HEENT: Head is normocephalic. Extraocular movements are intact. Oropharynx is notable for ***. Neck: Neck is notable for *** Heart:  Regular in rate and rhythm with no murmurs, rubs, or gallops. Chest: Clear to auscultation bilaterally, with no rhonchi, wheezes, or rales. Abdomen: Soft, nontender, nondistended, with no rigidity or guarding. Extremities: No cyanosis or edema. Lymphatics: see Neck Exam Skin: No concerning lesions. Musculoskeletal: symmetric strength and muscle tone throughout. Neurologic: Cranial nerves II through XII are grossly intact. No obvious focalities. Speech is fluent. Coordination is intact. Psychiatric: Judgment and insight are intact. Affect is appropriate.   ECOG = ***  0 - Asymptomatic (Fully active, able to carry on all predisease activities without restriction)  1 - Symptomatic but completely ambulatory (Restricted in physically strenuous activity but ambulatory and able to carry out work of a light or sedentary nature. For example, light housework, office work)  2 - Symptomatic, <50% in bed  during the day (Ambulatory and capable of all self care but unable to carry out any work activities. Up and about more than 50% of waking hours)  3 - Symptomatic, >50% in bed, but not bedbound (Capable of only limited self-care, confined to bed or chair 50% or more of waking hours)  4 - Bedbound (Completely disabled. Cannot carry on any self-care. Totally confined to bed or chair)  5 - Death   Aurea Blossom MM, Creech RH, Tormey DC, et al. [phone]). "Toxicity and response criteria of the [facility] [employer]". Am. Hillard Lowes. Oncol. 5 (6): 649-55   LABORATORY DATA:  Lab Results  Component Value Date   WBC 9.3 05/27/2023   HGB 14.6 05/27/2023   HCT 41.9 05/27/2023   MCV 85.0 05/27/2023   PLT 330 05/27/2023   CMP     Component Value Date/Time   NA 138 05/27/2023 0649   NA 139 04/27/2023 0829   NA 139 08/15/2013 1428   K 4.0 05/27/2023 0649   K 4.0 08/15/2013 1428   CL 102 05/27/2023 0649   CO2 25 05/27/2023 0649   CO2 29 08/15/2013 1428   GLUCOSE 88 05/27/2023 0649   GLUCOSE 155 (H)  08/15/2013 1428   BUN 6 (L) 05/27/2023 0649   BUN 6 (L) 04/27/2023 0829   BUN 14.3 08/15/2013 1428   CREATININE 0.90 05/27/2023 0649   CREATININE 1.2 08/15/2013 1428   CALCIUM  9.2 05/27/2023 0649   CALCIUM  9.8 08/15/2013 1428   PROT 7.1 04/27/2023 0829   PROT 7.3 06/30/2013 1438   ALBUMIN 4.3 04/27/2023 0829   ALBUMIN 3.9 06/30/2013 1438   AST 15 04/27/2023 0829   AST 13 06/30/2013 1438   ALT 10 04/27/2023 0829   ALT 11 06/30/2013 1438   ALKPHOS 177 (H) 04/27/2023 0829   ALKPHOS 147 06/30/2013 1438   BILITOT 1.1 04/27/2023 0829   BILITOT 0.59 06/30/2013 1438   GFRNONAA >60 05/27/2023 0649   GFRAA 104 04/10/2020 0824      Lab Results  Component Value Date   TSH 1.160 04/16/2018     RADIOGRAPHY: No results found.    IMPRESSION/PLAN:  This is a delightful patient with head and neck cancer. I *** recommend radiotherapy for this patient.  We discussed the potential risks, benefits, and side effects of radiotherapy. We talked in detail about acute and late effects. We discussed that some of the most bothersome acute effects may be mucositis, dysgeusia, salivary changes, skin irritation, hair loss, dehydration, weight loss and fatigue. We talked about late effects which include but are not necessarily limited to dysphagia, hypothyroidism, nerve injury, vascular injury, spinal cord injury, xerostomia, trismus, neck edema, dental issues, non-healing wound, and potentially fatal injury to any of the tissues in the head and neck region. No guarantees of treatment were given. A consent form was signed and placed in the patient's medical record. The patient is enthusiastic about proceeding with treatment. I look forward to participating in the patient's care.    Simulation (treatment planning) will take place ***  We also discussed that the treatment of head and neck cancer is a multidisciplinary process to maximize treatment outcomes and quality of life. For this reason the following  referrals have been or will be made:  *** Medical oncology to discuss chemotherapy   *** Dentistry for dental evaluation, possible extractions in the radiation fields, and /or advice on reducing risk of cavities, osteoradionecrosis, or other oral issues.  *** Nutritionist for nutrition support during and after  treatment.  *** Speech language pathology for swallowing and/or speech therapy.  *** Social work for social support.   *** Physical therapy due to risk of lymphedema in neck and deconditioning.  *** Baseline labs including TSH.  On date of service, in total, I spent *** minutes on this encounter. Patient was seen in person.  __________________________________________   Colie Dawes, MD  This document serves as a record of services personally performed by Colie Dawes, MD. It was created on her behalf by Aleta Anda, a trained medical scribe. The creation of this record is based on the scribe's personal observations and the provider's statements to them. This document has been checked and approved by the attending provider.

## 2023-06-08 ENCOUNTER — Ambulatory Visit
Admission: RE | Admit: 2023-06-08 | Discharge: 2023-06-08 | Disposition: A | Discharge status: Home / Self Care | Source: Ambulatory Visit | Attending: Radiation Oncology | Admitting: Radiation Oncology

## 2023-06-08 ENCOUNTER — Encounter: Payer: Self-pay | Admitting: Radiation Oncology

## 2023-06-08 ENCOUNTER — Other Ambulatory Visit: Payer: Self-pay

## 2023-06-08 VITALS — BP 111/76 | HR 83 | Temp 97.0°F | Resp 20 | Ht 74.0 in | Wt 248.2 lb

## 2023-06-08 DIAGNOSIS — I110 Hypertensive heart disease with heart failure: Secondary | ICD-10-CM | POA: Insufficient documentation

## 2023-06-08 DIAGNOSIS — Z7985 Long-term (current) use of injectable non-insulin antidiabetic drugs: Secondary | ICD-10-CM | POA: Insufficient documentation

## 2023-06-08 DIAGNOSIS — C090 Malignant neoplasm of tonsillar fossa: Secondary | ICD-10-CM | POA: Insufficient documentation

## 2023-06-08 DIAGNOSIS — Z809 Family history of malignant neoplasm, unspecified: Secondary | ICD-10-CM | POA: Insufficient documentation

## 2023-06-08 DIAGNOSIS — R1310 Dysphagia, unspecified: Secondary | ICD-10-CM | POA: Insufficient documentation

## 2023-06-08 DIAGNOSIS — I5032 Chronic diastolic (congestive) heart failure: Secondary | ICD-10-CM | POA: Insufficient documentation

## 2023-06-08 DIAGNOSIS — Z7984 Long term (current) use of oral hypoglycemic drugs: Secondary | ICD-10-CM | POA: Insufficient documentation

## 2023-06-08 DIAGNOSIS — F17290 Nicotine dependence, other tobacco product, uncomplicated: Secondary | ICD-10-CM | POA: Insufficient documentation

## 2023-06-08 DIAGNOSIS — Z79899 Other long term (current) drug therapy: Secondary | ICD-10-CM | POA: Insufficient documentation

## 2023-06-08 DIAGNOSIS — E785 Hyperlipidemia, unspecified: Secondary | ICD-10-CM | POA: Insufficient documentation

## 2023-06-08 DIAGNOSIS — C153 Malignant neoplasm of upper third of esophagus: Secondary | ICD-10-CM

## 2023-06-08 DIAGNOSIS — I509 Heart failure, unspecified: Secondary | ICD-10-CM | POA: Insufficient documentation

## 2023-06-08 DIAGNOSIS — R911 Solitary pulmonary nodule: Secondary | ICD-10-CM | POA: Insufficient documentation

## 2023-06-08 DIAGNOSIS — Z923 Personal history of irradiation: Secondary | ICD-10-CM | POA: Insufficient documentation

## 2023-06-08 DIAGNOSIS — C099 Malignant neoplasm of tonsil, unspecified: Secondary | ICD-10-CM

## 2023-06-08 MED ORDER — LIDOCAINE VISCOUS HCL 2 % MT SOLN: OROMUCOSAL | 3 refills | Status: DC

## 2023-06-08 NOTE — Progress Notes (Signed)
 Oncology Nurse Navigator Documentation   I met with Melvin Hudson today before his consult with Melvin Hudson. He is being seen for a new diagnosis of esophageal cancer but was treated with radiation in 2015 for a head and neck cancer. I provided him with my card and encouraged him to call me if I could help him with any questions. At Melvin Hudson request I ordered a PET, TSH, and referrals to nutrition and SLP therapy. I scheduled the PET for 06/11/23 and made the patient aware of the date/time. He will see Melvin Hudson on 4/28.   Melvin Saran RN, BSN, OCN Head & Neck Oncology Nurse Navigator [facility] at [facility] Phone # [phone]  Fax # [phone]

## 2023-06-10 ENCOUNTER — Encounter: Payer: Self-pay | Admitting: Radiation Oncology

## 2023-06-10 ENCOUNTER — Other Ambulatory Visit: Payer: Self-pay

## 2023-06-10 DIAGNOSIS — C090 Malignant neoplasm of tonsillar fossa: Secondary | ICD-10-CM

## 2023-06-10 NOTE — Progress Notes (Signed)
 I was informed that the patient is having significant issues with swallowing and regurgitation.  He is feeling weaker and concerned about his ability to come in for his PET scan tomorrow.  I just called him and he is going to find a friend or family member to bring him in for his PET scan.  I also updated him on the fact that Dr.  Maryalice Smaller is going to try to arrange a direct admission so he can get a PEG feeding tube placed as soon as possible.  He is pleased with this plan.    -----------------------------------   Colie Dawes MD

## 2023-06-11 ENCOUNTER — Encounter (HOSPITAL_COMMUNITY): Payer: Self-pay

## 2023-06-11 ENCOUNTER — Inpatient Hospital Stay: Admitting: Licensed Clinical Social Worker

## 2023-06-11 ENCOUNTER — Inpatient Hospital Stay (HOSPITAL_BASED_OUTPATIENT_CLINIC_OR_DEPARTMENT_OTHER): Admitting: Nurse Practitioner

## 2023-06-11 ENCOUNTER — Encounter (HOSPITAL_COMMUNITY)
Admission: RE | Admit: 2023-06-11 | Discharge: 2023-06-11 | Disposition: A | Discharge status: Home / Self Care | Source: Ambulatory Visit | Attending: Radiation Oncology | Admitting: Radiation Oncology

## 2023-06-11 ENCOUNTER — Encounter (HOSPITAL_COMMUNITY): Payer: Self-pay | Admitting: Internal Medicine

## 2023-06-11 ENCOUNTER — Other Ambulatory Visit: Payer: Self-pay

## 2023-06-11 ENCOUNTER — Inpatient Hospital Stay

## 2023-06-11 ENCOUNTER — Inpatient Hospital Stay (HOSPITAL_COMMUNITY)
Admission: AD | Admit: 2023-06-11 | Discharge: 2023-06-17 | DRG: 375 | Disposition: A | Discharge status: Home Health | Source: Ambulatory Visit | Attending: Internal Medicine | Admitting: Internal Medicine

## 2023-06-11 VITALS — BP 138/88 | HR 74 | Temp 97.7°F | Resp 17 | Wt 248.0 lb

## 2023-06-11 DIAGNOSIS — R1310 Dysphagia, unspecified: Principal | ICD-10-CM

## 2023-06-11 DIAGNOSIS — Z803 Family history of malignant neoplasm of breast: Secondary | ICD-10-CM | POA: Diagnosis not present

## 2023-06-11 DIAGNOSIS — Z7985 Long-term (current) use of injectable non-insulin antidiabetic drugs: Secondary | ICD-10-CM

## 2023-06-11 DIAGNOSIS — E7800 Pure hypercholesterolemia, unspecified: Secondary | ICD-10-CM | POA: Diagnosis present

## 2023-06-11 DIAGNOSIS — C153 Malignant neoplasm of upper third of esophagus: Secondary | ICD-10-CM | POA: Insufficient documentation

## 2023-06-11 DIAGNOSIS — Z5982 Transportation insecurity: Secondary | ICD-10-CM | POA: Diagnosis not present

## 2023-06-11 DIAGNOSIS — Z85818 Personal history of malignant neoplasm of other sites of lip, oral cavity, and pharynx: Secondary | ICD-10-CM

## 2023-06-11 DIAGNOSIS — Z923 Personal history of irradiation: Secondary | ICD-10-CM | POA: Insufficient documentation

## 2023-06-11 DIAGNOSIS — E669 Obesity, unspecified: Secondary | ICD-10-CM | POA: Diagnosis present

## 2023-06-11 DIAGNOSIS — I82452 Acute embolism and thrombosis of left peroneal vein: Secondary | ICD-10-CM | POA: Diagnosis present

## 2023-06-11 DIAGNOSIS — Z85828 Personal history of other malignant neoplasm of skin: Secondary | ICD-10-CM | POA: Diagnosis not present

## 2023-06-11 DIAGNOSIS — Z6831 Body mass index (BMI) 31.0-31.9, adult: Secondary | ICD-10-CM | POA: Diagnosis not present

## 2023-06-11 DIAGNOSIS — Z833 Family history of diabetes mellitus: Secondary | ICD-10-CM | POA: Diagnosis not present

## 2023-06-11 DIAGNOSIS — Z886 Allergy status to analgesic agent status: Secondary | ICD-10-CM

## 2023-06-11 DIAGNOSIS — E440 Moderate protein-calorie malnutrition: Secondary | ICD-10-CM | POA: Diagnosis present

## 2023-06-11 DIAGNOSIS — I82412 Acute embolism and thrombosis of left femoral vein: Secondary | ICD-10-CM | POA: Diagnosis present

## 2023-06-11 DIAGNOSIS — Z8249 Family history of ischemic heart disease and other diseases of the circulatory system: Secondary | ICD-10-CM | POA: Diagnosis not present

## 2023-06-11 DIAGNOSIS — Z888 Allergy status to other drugs, medicaments and biological substances status: Secondary | ICD-10-CM

## 2023-06-11 DIAGNOSIS — C099 Malignant neoplasm of tonsil, unspecified: Secondary | ICD-10-CM | POA: Insufficient documentation

## 2023-06-11 DIAGNOSIS — R1319 Other dysphagia: Secondary | ICD-10-CM

## 2023-06-11 DIAGNOSIS — M19071 Primary osteoarthritis, right ankle and foot: Secondary | ICD-10-CM | POA: Diagnosis present

## 2023-06-11 DIAGNOSIS — K219 Gastro-esophageal reflux disease without esophagitis: Secondary | ICD-10-CM | POA: Diagnosis present

## 2023-06-11 DIAGNOSIS — G4733 Obstructive sleep apnea (adult) (pediatric): Secondary | ICD-10-CM | POA: Diagnosis present

## 2023-06-11 DIAGNOSIS — Z608 Other problems related to social environment: Secondary | ICD-10-CM | POA: Diagnosis present

## 2023-06-11 DIAGNOSIS — Z87891 Personal history of nicotine dependence: Secondary | ICD-10-CM | POA: Diagnosis not present

## 2023-06-11 DIAGNOSIS — E119 Type 2 diabetes mellitus without complications: Secondary | ICD-10-CM | POA: Diagnosis present

## 2023-06-11 DIAGNOSIS — Z79899 Other long term (current) drug therapy: Secondary | ICD-10-CM

## 2023-06-11 DIAGNOSIS — I110 Hypertensive heart disease with heart failure: Secondary | ICD-10-CM | POA: Diagnosis present

## 2023-06-11 DIAGNOSIS — I10 Essential (primary) hypertension: Secondary | ICD-10-CM | POA: Diagnosis not present

## 2023-06-11 DIAGNOSIS — I82442 Acute embolism and thrombosis of left tibial vein: Secondary | ICD-10-CM | POA: Diagnosis present

## 2023-06-11 DIAGNOSIS — Z823 Family history of stroke: Secondary | ICD-10-CM | POA: Diagnosis not present

## 2023-06-11 DIAGNOSIS — C329 Malignant neoplasm of larynx, unspecified: Secondary | ICD-10-CM | POA: Diagnosis present

## 2023-06-11 DIAGNOSIS — I82462 Acute embolism and thrombosis of left calf muscular vein: Secondary | ICD-10-CM | POA: Diagnosis present

## 2023-06-11 DIAGNOSIS — I82432 Acute embolism and thrombosis of left popliteal vein: Secondary | ICD-10-CM | POA: Diagnosis present

## 2023-06-11 DIAGNOSIS — Z602 Problems related to living alone: Secondary | ICD-10-CM | POA: Diagnosis present

## 2023-06-11 DIAGNOSIS — I5032 Chronic diastolic (congestive) heart failure: Secondary | ICD-10-CM | POA: Diagnosis present

## 2023-06-11 DIAGNOSIS — Z7984 Long term (current) use of oral hypoglycemic drugs: Secondary | ICD-10-CM

## 2023-06-11 DIAGNOSIS — M19072 Primary osteoarthritis, left ankle and foot: Secondary | ICD-10-CM | POA: Diagnosis present

## 2023-06-11 DIAGNOSIS — R911 Solitary pulmonary nodule: Secondary | ICD-10-CM | POA: Diagnosis present

## 2023-06-11 DIAGNOSIS — M79605 Pain in left leg: Secondary | ICD-10-CM | POA: Diagnosis not present

## 2023-06-11 LAB — HIV ANTIBODY (ROUTINE TESTING W REFLEX): HIV Screen 4th Generation wRfx: NONREACTIVE

## 2023-06-11 LAB — GLUCOSE, CAPILLARY
Glucose-Capillary: 113 mg/dL — ABNORMAL HIGH (ref 70–99)
Glucose-Capillary: 82 mg/dL (ref 70–99)
Glucose-Capillary: 82 mg/dL (ref 70–99)
Glucose-Capillary: 88 mg/dL (ref 70–99)

## 2023-06-11 MED ORDER — HYDRALAZINE HCL 20 MG/ML IJ SOLN
5.0000 mg | Freq: Four times a day (QID) | INTRAMUSCULAR | Status: DC | PRN
Start: 2023-06-11 — End: 2023-06-17
  Administered 2023-06-12: 5 mg via INTRAVENOUS
  Filled 2023-06-11: qty 1

## 2023-06-11 MED ORDER — ACETAMINOPHEN 650 MG RE SUPP: 650.0000 mg | Freq: Four times a day (QID) | RECTAL | Status: DC | PRN

## 2023-06-11 MED ORDER — ISOSORB DINITRATE-HYDRALAZINE 20-37.5 MG PO TABS
1.0000 | ORAL_TABLET | Freq: Two times a day (BID) | ORAL | Status: DC
Start: 2023-06-11 — End: 2023-06-11
  Filled 2023-06-11: qty 1

## 2023-06-11 MED ORDER — ALBUTEROL SULFATE (2.5 MG/3ML) 0.083% IN NEBU: 2.5000 mg | INHALATION_SOLUTION | RESPIRATORY_TRACT | Status: DC | PRN

## 2023-06-11 MED ORDER — PANTOPRAZOLE SODIUM 40 MG IV SOLR
40.0000 mg | INTRAVENOUS | Status: DC
  Administered 2023-06-11 – 2023-06-12 (×2): 40 mg via INTRAVENOUS
  Filled 2023-06-11 (×2): qty 10

## 2023-06-11 MED ORDER — FLUDEOXYGLUCOSE F - 18 (FDG) INJECTION
11.5700 | Freq: Once | INTRAVENOUS | Status: AC
  Administered 2023-06-11: 11.57 via INTRAVENOUS

## 2023-06-11 MED ORDER — ONDANSETRON HCL 4 MG/2ML IJ SOLN: 4.0000 mg | Freq: Four times a day (QID) | INTRAMUSCULAR | Status: DC | PRN

## 2023-06-11 MED ORDER — POLYETHYLENE GLYCOL 3350 17 G PO PACK: 17.0000 g | PACK | Freq: Every day | ORAL | Status: DC | PRN

## 2023-06-11 MED ORDER — INSULIN ASPART 100 UNIT/ML IJ SOLN
0.0000 [IU] | INTRAMUSCULAR | Status: DC
  Administered 2023-06-13 (×2): 3 [IU] via SUBCUTANEOUS
  Administered 2023-06-13 – 2023-06-14 (×6): 2 [IU] via SUBCUTANEOUS
  Administered 2023-06-14: 3 [IU] via SUBCUTANEOUS
  Administered 2023-06-15: 2 [IU] via SUBCUTANEOUS
  Administered 2023-06-15 (×2): 3 [IU] via SUBCUTANEOUS
  Administered 2023-06-15: 2 [IU] via SUBCUTANEOUS
  Administered 2023-06-15: 3 [IU] via SUBCUTANEOUS
  Administered 2023-06-16 (×2): 2 [IU] via SUBCUTANEOUS
  Administered 2023-06-16: 3 [IU] via SUBCUTANEOUS
  Administered 2023-06-16: 2 [IU] via SUBCUTANEOUS
  Administered 2023-06-16 (×2): 3 [IU] via SUBCUTANEOUS
  Administered 2023-06-17: 2 [IU] via SUBCUTANEOUS
  Administered 2023-06-17: 8 [IU] via SUBCUTANEOUS

## 2023-06-11 MED ORDER — HYDROCODONE-ACETAMINOPHEN 5-325 MG PO TABS: 1.0000 | ORAL_TABLET | ORAL | Status: DC | PRN

## 2023-06-11 MED ORDER — ONDANSETRON HCL 4 MG PO TABS: 4.0000 mg | ORAL_TABLET | Freq: Four times a day (QID) | ORAL | Status: DC | PRN

## 2023-06-11 MED ORDER — SENNOSIDES-DOCUSATE SODIUM 8.6-50 MG PO TABS: 1.0000 | ORAL_TABLET | Freq: Every day | ORAL | Status: DC | PRN

## 2023-06-11 MED ORDER — PANTOPRAZOLE SODIUM 40 MG PO TBEC: 40.0000 mg | DELAYED_RELEASE_TABLET | Freq: Every day | ORAL | Status: DC

## 2023-06-11 MED ORDER — ACETAMINOPHEN 325 MG PO TABS: 650.0000 mg | ORAL_TABLET | Freq: Four times a day (QID) | ORAL | Status: DC | PRN

## 2023-06-11 MED ORDER — SODIUM CHLORIDE 0.9 % IV SOLN: INTRAVENOUS | Status: DC

## 2023-06-11 MED ORDER — SPIRONOLACTONE 25 MG PO TABS: 25.0000 mg | ORAL_TABLET | Freq: Every morning | ORAL | Status: DC

## 2023-06-11 NOTE — Plan of Care (Signed)

## 2023-06-11 NOTE — H&P (Signed)
 History and Physical  [name] ZOX:[id number] DOB: Jun 07, 1955 DOA: 06/11/2023  PCP: Lorella Roles, MD   Chief Complaint: Dysphagia  HPI: Melvin Hudson is a 68 y.o. 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  He has had progressive dyspnea, intolerance of diet, and has been losing a significant amount of weight.  He had a PET scan today to help with staging of his newly diagnosed esophageal squamous cell carcinoma, and his oncology team has requested direct admission to the hospital for prompt feeding tube insertion.  Review of Systems: Please see HPI for pertinent positives and negatives. A complete 10 system review of systems are otherwise negative.  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   Past Surgical History:  Procedure Laterality Date   COLONOSCOPY Left 04/11/2014   Procedure: COLONOSCOPY;  Surgeon: Evangeline Hilts, MD;  Location: [facility] ENDOSCOPY;  Service: Endoscopy;  Laterality: Left;   COLONOSCOPY N/A 04/20/2014   Procedure: COLONOSCOPY;  Surgeon: Yvetta Herbert, MD;  Location: [facility] ENDOSCOPY;  Service: Endoscopy;  Laterality: N/A;   COLONOSCOPY W/ BIOPSIES AND POLYPECTOMY     benign   DIRECT LARYNGOSCOPY  01/05/2014   DIRECT LARYNGOSCOPY N/A 01/05/2014   Procedure: DIRECT  LARYNGOSCOPY AND BIOPSY;  Surgeon: Janita Mellow, MD;  Location: [facility] OR;  Service: ENT;  Laterality: N/A;   ESOPHAGOSCOPY WITH DILITATION N/A 05/27/2023   Procedure: ESOPHAGOSCOPY;  Surgeon: Janita Mellow, MD;  Location: [facility] OR;  Service: ENT;  Laterality: N/A;   KNEE ARTHROSCOPY Right ~ 1994   LEFT AND RIGHT HEART CATHETERIZATION WITH CORONARY/GRAFT ANGIOGRAM N/A 12/31/2010   Procedure: LEFT AND RIGHT HEART CATHETERIZATION WITH Estella Helling;  Surgeon: Jessica Morn, MD;  Location: [facility] CATH LAB;  Service: Cardiovascular;  Laterality: N/A;   MULTIPLE EXTRACTIONS WITH ALVEOLOPLASTY N/A 07/08/2013   Procedure: Extraction of tooth #'s 22, 27 with alveoloplasty and bilateal mandibular facial exostoses reductions;  Surgeon: Carol Chroman, DDS;  Location: [facility] ORS;  Service: Oral Surgery;  Laterality: N/A;   MULTIPLE TOOTH EXTRACTIONS  ~ 2008   Social History:  reports that he quit smoking about 9 years ago. His smoking use included cigarettes and cigars. He started smoking about 52 years ago. He has a 84 pack-year smoking history. He has never used smokeless tobacco. He reports that he does not drink alcohol  and does not use drugs.  Allergies  Allergen Reactions   Azilsartan Itching    Avoid ARB and ACEI per MD   Advil [Ibuprofen]     Feels "jittery"    Family History  Problem Relation Age of Onset   Cancer Mother    Stroke Mother    Hypertension Mother    Diabetes Father    Aneurysm Father    Cancer Sister  breast ca     Prior to Admission medications   Medication Sig Start Date End Date Taking? Authorizing Provider  albuterol  (VENTOLIN  HFA) 108 (90 Base) MCG/ACT inhaler Inhale 2 puffs into the lungs every 4 (four) hours as needed for wheezing (Cough). 04/02/23   [provider]  allopurinol  (ZYLOPRIM ) 300 MG tablet Take 300 mg by mouth daily.    [provider]  atorvastatin  (LIPITOR) 40 MG tablet Take 1 tablet (40 mg total) by mouth at bedtime. 10/07/19  05/21/23  [name], [name], DO  Blood Glucose Monitoring Suppl (ONETOUCH VERIO FLEX SYSTEM) w/Device KIT USE TO CHECK BLOOD SUGAR TWICE DAILY 04/21/19   [provider]  calcium  carbonate (TUMS - DOSED IN MG ELEMENTAL CALCIUM ) 500 MG chewable tablet Chew 2 tablets (400 mg of elemental calcium  total) by mouth 3 (three) times daily with meals. Patient taking differently: Chew 400 mg of elemental calcium  by mouth daily as needed. 04/18/22   Cleven Dallas, DO  carvedilol  (COREG ) 25 MG tablet Take 25 mg by mouth 2 (two) times daily with a meal.    [provider]  clonazePAM  (KLONOPIN ) 1 MG disintegrating tablet Take 1 mg by mouth daily. 03/10/22   [provider]  dapagliflozin  propanediol (FARXIGA ) 10 MG TABS tablet Take 10 mg by mouth daily.    [provider]  diphenhydrAMINE  (BENADRYL ) 25 MG tablet Take 1 tablet (25 mg total) by mouth every 6 (six) hours. Patient taking differently: Take 25 mg by mouth every 6 (six) hours as needed for allergies or sleep. 10/29/13   Trish Furl, MD  gabapentin  (NEURONTIN ) 300 MG capsule Take 300 mg by mouth 3 (three) times daily.  01/19/18   [provider]  hydrocortisone  (ANUSOL -HC) 2.5 % rectal cream Place 1 Application rectally daily as needed for hemorrhoids or anal itching. 04/29/23   [provider]  isosorbide -hydrALAZINE  (BIDIL ) 20-37.5 MG tablet Take 1 tablet by mouth in the morning and at bedtime. 04/20/23   [name], [name], DO  lidocaine  (XYLOCAINE ) 2 % solution Patient: Mix 1part 2% viscous lidocaine , 1part H20. Swallow 10mL of diluted mixture, before meals and at bedtime, up to QID 06/08/23   Colie Dawes, MD  NIFEdipine  (PROCARDIA  XL/NIFEDICAL-XL) 90 MG 24 hr tablet Take 90 mg by mouth at bedtime.    [provider]  omeprazole  (PRILOSEC) 20 MG capsule Take 20 mg by mouth daily.  06/04/16   [provider]  ONETOUCH VERIO test strip USE TO CHECK BLOOD SUGAR TWICE DAILY 07/20/19   [provider]  OZEMPIC , 0.25 OR 0.5 MG/DOSE, 2 MG/1.5ML SOPN Inject 0.5 mg into the skin once a week. Sunday's 12/29/19   [provider]  polyethylene glycol (MIRALAX  / GLYCOLAX ) 17 g packet Take 17 g by mouth daily. Patient taking differently: Take 17 g by mouth daily as needed. 04/19/22   Cleven Dallas, DO  psyllium (HYDROCIL/METAMUCIL) 95 % PACK Take 1 packet by mouth daily. Patient taking differently: Take 1 packet by mouth daily as needed for mild constipation or moderate constipation. 04/15/22   Cleven Dallas, DO  senna-docusate (SENOKOT-S) 8.6-50 MG tablet Take 1 tablet by mouth 2 (two) times daily. Patient taking differently: Take 1 tablet by mouth daily as needed. 04/18/22   Cleven Dallas, DO  spironolactone  (ALDACTONE ) 25 MG tablet Take 1 tablet (25 mg total) by mouth in the morning. 04/20/23   Olinda Bertrand, DO    Physical Exam: BP 112/84 (BP Location: Left Arm)   Pulse 71  Temp 97.7 F (36.5 C) (Oral)   Resp 16   SpO2 100%  General:  Alert, oriented, calm, in no acute distress  Eyes: EOMI, clear conjuctivae, white sclerea Neck: supple, no masses, trachea mildline  Cardiovascular: RRR, no murmurs or rubs, no peripheral edema  Respiratory: clear to auscultation bilaterally, no wheezes, no crackles  Abdomen: soft, nontender, nondistended, normal bowel tones heard  Skin: dry, no rashes  Musculoskeletal: no joint effusions, normal range of motion  Psychiatric: appropriate affect, normal speech  Neurologic: extraocular muscles intact, clear speech, moving all extremities with intact sensorium         Labs on Admission:  Basic Metabolic Panel: No results for input(s): "NA", "K", "CL", "CO2", "GLUCOSE", "BUN", "CREATININE", "CALCIUM ", "MG", "PHOS" in the last 168 hours. Liver Function Tests: No results for input(s): "AST", "ALT", "ALKPHOS", "BILITOT", "PROT", "ALBUMIN" in the last 168 hours. No results for input(s): "LIPASE", "AMYLASE" in the last 168 hours. No  results for input(s): "AMMONIA" in the last 168 hours. CBC: No results for input(s): "WBC", "NEUTROABS", "HGB", "HCT", "MCV", "PLT" in the last 168 hours. Cardiac Enzymes: No results for input(s): "CKTOTAL", "CKMB", "CKMBINDEX", "TROPONINI" in the last 168 hours. BNP (last 3 results) No results for input(s): "BNP" in the last 8760 hours.  ProBNP (last 3 results) No results for input(s): "PROBNP" in the last 8760 hours.  CBG: Recent Labs  Lab 06/11/23 1240  GLUCAP 113*    Radiological Exams on Admission: No results found. Assessment/Plan Melvin Hudson is a 68 y.o. 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   Progressive dysphagia-due to recently diagnosed esophageal squamous cell carcinoma.  At this time, he is barely able to tolerate some soups.  Able to swallow some pills. -Inpatient admission -Gentle IV fluids -Full liquid diet today, with n.p.o. after midnight -IR consultation placed for evaluation and management, requesting feeding tube  GERD-Protonix   Hypertension-Aldactone   Discussed with bedside RN, medications can be changed to IV form if necessary.  DVT prophylaxis: SCDs     Code Status: Full Code  Consults called: IR  Admission status: The appropriate patient status for this patient is INPATIENT. Inpatient status is judged to be reasonable and necessary in order to provide the required intensity of service to ensure the patient's safety. The patient's presenting symptoms, physical exam findings, and initial radiographic and laboratory data in the context of their chronic comorbidities is felt to place them at high risk for further clinical deterioration. Furthermore, it is not anticipated that the patient will be medically stable for discharge from the  hospital within 2 midnights of admission.    I certify that at the point of admission it is my clinical judgment that the patient will require inpatient hospital care spanning beyond 2 midnights from the point of admission due to high intensity of service, high risk for further deterioration and high frequency of surveillance required  Time spent: 59 minutes  Shanica Castellanos Rickey Charm MD [facility] Pager [phone]  If 7PM-7AM, please contact night-coverage [email or web address] Password [facility]  06/11/2023, 3:58 PM

## 2023-06-11 NOTE — Progress Notes (Addendum)
   06/11/23 1513  TOC Brief Assessment  Insurance and Status Reviewed  Patient has primary care physician Yes (Himanshu Paliwal at [facility])  Home environment has been reviewed home alone  Prior level of function: Independent  Prior/Current Home Services No current home services  Social Drivers of Health Review SDOH reviewed no interventions necessary  Readmission risk has been reviewed Yes  Transition of care needs no transition of care needs at this time

## 2023-06-11 NOTE — Progress Notes (Addendum)
 This nurse administered 3mL of water to pt so swallow test. Pt unable to to swallow fluids without choking. This nurse encouraged pt not to intake anything PO.

## 2023-06-11 NOTE — Progress Notes (Addendum)
 [facility]  [phone]   HEMATOLOGY ONCOLOGY CONSULTATION   Melvin Hudson  DOB: 21-Dec-1955  MR#: [id number]  CSN#: [id number]    Patient Care Team: Lorella Roles, MD as PCP - General (Family Medicine) Olinda Bertrand, DO as PCP - Cardiology (Cardiology) Clance, Deena Farrier, MD as Referring Physician (Pulmonary Disease) Almeda Jacobs, MD as Consulting Physician (Hematology and Oncology) Colie Dawes, MD as Attending Physician (Radiation Oncology)  Reason for consult: new diagnosis esophageal cancer   History of present illness:   This the patient has history of pharyngeal cancer, 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  He was able to achieve remission.  He states in December 2024, he started having difficulty swallowing.  This became progressively worse.  At this point, he vomits if he tries to eat and gets "choked."  He does get some pain in the throat when he vomits.  He denies nausea and vomiting.  He is able to have normal bowel movements.  Denies constipation or diarrhea.  Has gradually become unable to tolerate solid food by mouth, even thickened liquids such as milkshakes or applesauce.  Sometimes, he is able to sip on Boost protein supplement shakes.  He reports a 40 to 50 pound weight loss since dysphagia started in December 2024. He saw Dr. Donalee Fruits, ENT.  Patient underwent endoscopy procedure.  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  He denies other physical complaints. He denies chest pain, chest pressure, or shortness of breath. He denies headaches or visual disturbances. He denies abdominal pain, nausea, or changes in bowel or bladder habits.   Socially, the patient lives alone.  States his sister lives upstairs.  He is not married and does not have children.  He states his sister would help him with household chores if needed.  He is able to complete ADLs without assistance at this time.  He states he does not smoke.  He denies alcohol   consumption.  He does not use illegal or illicit drugs.  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    SURGICAL HISTORY: Past Surgical History:  Procedure Laterality Date   COLONOSCOPY Left 04/11/2014   Procedure: COLONOSCOPY;  Surgeon: Evangeline Hilts, MD;  Location: [facility] ENDOSCOPY;  Service: Endoscopy;  Laterality: Left;   COLONOSCOPY N/A 04/20/2014   Procedure: COLONOSCOPY;  Surgeon: Yvetta Herbert, MD;  Location: [facility] ENDOSCOPY;  Service: Endoscopy;  Laterality: N/A;   COLONOSCOPY W/ BIOPSIES AND POLYPECTOMY     benign   DIRECT LARYNGOSCOPY  01/05/2014   DIRECT LARYNGOSCOPY N/A 01/05/2014   Procedure: DIRECT LARYNGOSCOPY AND BIOPSY;  Surgeon: Janita Mellow, MD;  Location: [facility] OR;  Service: ENT;  Laterality: N/A;   ESOPHAGOSCOPY WITH DILITATION N/A 05/27/2023   Procedure: ESOPHAGOSCOPY;  Surgeon: Janita Mellow, MD;  Location: [facility] OR;  Service: ENT;  Laterality: N/A;   KNEE ARTHROSCOPY Right ~ 1994   LEFT  AND RIGHT HEART CATHETERIZATION WITH CORONARY/GRAFT ANGIOGRAM N/A 12/31/2010   Procedure: LEFT AND RIGHT HEART CATHETERIZATION WITH Estella Helling;  Surgeon: Jessica Morn, MD;  Location: [facility] CATH LAB;  Service: Cardiovascular;  Laterality: N/A;   MULTIPLE EXTRACTIONS WITH ALVEOLOPLASTY N/A 07/08/2013   Procedure: Extraction of tooth #'s 22, 27 with alveoloplasty and bilateal mandibular facial exostoses reductions;  Surgeon: Carol Chroman, DDS;  Location: [facility] ORS;  Service: Oral  Surgery;  Laterality: N/A;   MULTIPLE TOOTH EXTRACTIONS  ~ 2008    SOCIAL HISTORY: Social History   Socioeconomic History   Marital status: Widowed    Spouse name: Not on file   Number of children: 0   Years of education: Not on file   Highest education level: Not on file  Occupational History   Not on file  Tobacco Use   Smoking status: Former    Current packs/day: 0.00    Average packs/day: 2.0 packs/day for 42.0 years (84.0 ttl pk-yrs)    Types: Cigarettes, Cigars    Start date: 06/18/1971    Quit date: 06/17/2013    Years since quitting: 9.9   Smokeless tobacco: Never  Vaping Use   Vaping status: Never Used  Substance and Sexual Activity   Alcohol  use: Never   Drug use: No   Sexual activity: Not Currently  Other Topics Concern   Not on file  Social History Narrative   Not on file   Social Drivers of Health   Financial Resource Strain: Not on file  Food Insecurity: No Food Insecurity (06/11/2023)   Hunger Vital Sign    Worried About Running Out of Food in the Last Year: Never true    Ran Out of Food in the Last Year: Never true  Transportation Needs: Unmet Transportation Needs (06/11/2023)   PRAPARE - Administrator, Civil Service (Medical): Yes    Lack of Transportation (Non-Medical): Yes  Physical Activity: Not on file  Stress: Not on file  Social Connections: Socially Isolated (06/11/2023)   Social Connection and Isolation Panel [NHANES]    Frequency of Communication with Friends and Family: More than three times a week    Frequency of Social Gatherings with Friends and Family: More than three times a week    Attends Religious Services: Never    Database administrator or Organizations: No    Attends Banker Meetings: Never    Marital Status: Widowed  Intimate Partner Violence: Not At Risk (06/11/2023)   Humiliation, Afraid, Rape, and Kick questionnaire    Fear of Current or Ex-Partner: No    Emotionally Abused: No    Physically Abused:  No    Sexually Abused: No    FAMILY HISTORY: Family History  Problem Relation Age of Onset   Cancer Mother    Stroke Mother    Hypertension Mother    Diabetes Father    Aneurysm Father    Cancer Sister        breast ca    ALLERGIES:  is allergic to azilsartan and advil [ibuprofen].  MEDICATIONS:  No current facility-administered medications for this visit.   No current outpatient medications on file.   Facility-Administered Medications Ordered in Other Visits  Medication Dose Route Frequency Provider Last Rate Last Admin   acetaminophen  (TYLENOL ) tablet 650 mg  650 mg Oral Q6H PRN Jannette Mend, Mir M, MD       Or   acetaminophen  (TYLENOL ) suppository 650 mg  650 mg Rectal Q6H PRN Jannette Mend,  Mir M, MD       albuterol  (PROVENTIL ) (2.5 MG/3ML) 0.083% nebulizer solution 2.5 mg  2.5 mg Nebulization Q2H PRN Jannette Mend, Mir M, MD       dextrose  5 % in lactated ringers  infusion   Intravenous Continuous Chavez, Abigail, NP 75 mL/hr at 06/12/23 0409 New Bag at 06/12/23 0409   hydrALAZINE  (APRESOLINE ) injection 5 mg  5 mg Intravenous Q6H PRN Jannette Mend, Mir M, MD       insulin  aspart (novoLOG ) injection 0-15 Units  0-15 Units Subcutaneous Q4H Jannette Mend, Mir M, MD       ondansetron  (ZOFRAN ) tablet 4 mg  4 mg Oral Q6H PRN Jannette Mend, Mir M, MD       Or   ondansetron  (ZOFRAN ) injection 4 mg  4 mg Intravenous Q6H PRN Jannette Mend, Mir M, MD       pantoprazole  (PROTONIX ) injection 40 mg  40 mg Intravenous Q24H Jannette Mend, Mir M, MD   40 mg at 06/11/23 1811   polyethylene glycol (MIRALAX  / GLYCOLAX ) packet 17 g  17 g Oral Daily PRN Jannette Mend, Mir M, MD       senna-docusate (Senokot-S) tablet 1 tablet  1 tablet Oral Daily PRN Jannette Mend, Mir M, MD        REVIEW OF SYSTEMS:   Constitutional: Denies fevers, chills or abnormal night sweats.  He has had a 40 to 50 pound weight loss since December 2024 due to difficulty swallowing. Eyes: Denies blurriness of vision, double vision or watery  eyes Ears, nose, mouth, throat, and face: Denies mucositis or sore throat Respiratory: Denies cough, dyspnea or wheezes Cardiovascular: Denies palpitation, chest discomfort or lower extremity swelling Gastrointestinal:  Denies nausea, heartburn or change in bowel habits.  Will vomit if he eats something that causes him to feel "choked." Skin: Denies abnormal skin rashes Lymphatics: Denies new lymphadenopathy or easy bruising Neurological:Denies numbness, tingling or new weaknesses Behavioral/Psych: Mood is stable, no new changes  All other systems were reviewed with the patient and are negative.  PHYSICAL EXAMINATION: ECOG PERFORMANCE STATUS: 1 - Symptomatic but completely ambulatory  Vitals:   06/11/23 1132  BP: 138/88  Pulse: 74  Resp: 17  Temp: 97.7 F (36.5 C)  SpO2: 97%   Filed Weights   06/11/23 1132  Weight: 248 lb (112.5 kg)    GENERAL:alert, no distress and comfortable SKIN: skin color, texture, turgor are normal, no rashes or significant lesions EYES: normal, conjunctiva are pink and non-injected, sclera clear OROPHARYNX:no exudate, no erythema and lips, buccal mucosa, and tongue normal  NECK: supple, thyroid  normal size, non-tender, without nodularity LYMPH:  no palpable lymphadenopathy in the cervical, axillary or inguinal LUNGS: clear to auscultation and percussion with normal breathing effort HEART: regular rate & rhythm and no murmurs and no lower extremity edema ABDOMEN:abdomen soft, non-tender and normal bowel sounds Musculoskeletal:no cyanosis of digits and no clubbing  PSYCH: alert & oriented x 3 with fluent speech NEURO: no focal motor/sensory deficits  LABORATORY DATA:  I have reviewed the data as listed Lab Results  Component Value Date   WBC 7.9 06/12/2023   HGB 13.5 06/12/2023   HCT 39.2 06/12/2023   MCV 87.3 06/12/2023   PLT 269 06/12/2023   Recent Labs    04/27/23 0829 05/27/23 0649 06/12/23 0505  NA 139 138 136  K 4.4 4.0 3.7  CL  100 102 101  CO2 24 25 26   GLUCOSE 90 88 85  BUN 6* 6* 9  CREATININE 0.92 0.90 0.58*  CALCIUM  9.7  9.2 8.7*  GFRNONAA  --  >60 >60  PROT 7.1  --   --   ALBUMIN 4.3  --   --   AST 15  --   --   ALT 10  --   --   ALKPHOS 177*  --   --   BILITOT 1.1  --   --    Assessment and Plan Malignant neoplasm of upper third esophagus [facility]) Assessment & Plan: history of pharyngeal cancer, 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  He was able to achieve remission.  He states in December 2024, he started having difficulty swallowing.  This became progressively worse.  At this point, he vomits if he tries to eat and gets "choked."  He does get some pain in the throat when he vomits.  He denies nausea and vomiting.  He is able to have normal bowel movements.  Denies constipation or diarrhea.  Has gradually become unable to tolerate solid food by mouth, even thickened liquids such as milkshakes or applesauce.  Sometimes, he is able to sip on Boost protein supplement shakes.  He reports a 40 to 50 pound weight loss since dysphagia started in December 2024. He saw Dr. Donalee Fruits, ENT.  Patient underwent endoscopy procedure.  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  The patient is to be admitted for urgent PEG tube placement and will begin to receive parenteral nutrition through PEG tube.  Will get PET scan or CT CAP to determine distant metastases which will aid in determining appropriate treatment.  Discussed with the patient that treatment will likely consist of chemotherapy and possibly radiation.  Surgery is not likely to be recommended due to location of malignancy.  He states he would like to proceed with treatment, what ever treatment is recommended at this time.  Orders: -     CBC with Differential (Cancer Center Only); Future -     CMP (Cancer Center only); Future -     Magnesium; Future    The patient was seen along with Dr. Maryalice Smaller today. All questions were answered. The patient knows to  call the clinic with any problems, questions or concerns. Time spent with the patient was approximately 30 minutes. This time included reviewing progress notes, labs, imaging studies, and discussing plan for follow up.        Sharyon Deis, NP 06/11/2023 10:17 AM  Addendum I have seen the patient, examined him. I agree with the assessment and and plan and have edited the notes.   68 year old gentleman with past medical history of tonsillar cancer diagnosed in 2015, status post tonsillectomy and adjuvant radiation therapy, now presented with progressive dysphagia since December 2024.  He has been followed by his ENT Dr. Donalee Fruits and eventually had EGD on 05/27/2023 which showed a mass in the proximal esophagus, 2 to 3 cm below introitus.  Biopsy confirmed invasive squamous cell carcinoma.  Patient will have PET scan today for staging.  Due to his progressive dysphagia and significant weight loss, that is why and I feel he needs urgent EGD.  Will admit him to [facility] for the procedure and start him on tube feeds.  If PET scan is negative for distant metastasis, we will plan to proceed with concurrent chemoradiation with weekly carboplatin  and paclitaxel .  Due to the location, surgical resection is not feasible. Will offer immunotherapy if he has residual disease after concurrent chemoradiation.  All questions were answered.  I spent a total of 45 minutes  for his visit today, more than 50% time on face-to-face counseling.  Patient has limited social support, he also met our social worker Amalia Badder today. I spent a total of 60 mins for his visit today and >50% on face-to-face counseling.   Sonja Milledgeville MD 06/11/2023

## 2023-06-12 ENCOUNTER — Inpatient Hospital Stay (HOSPITAL_COMMUNITY)

## 2023-06-12 DIAGNOSIS — E440 Moderate protein-calorie malnutrition: Secondary | ICD-10-CM | POA: Insufficient documentation

## 2023-06-12 DIAGNOSIS — R1319 Other dysphagia: Secondary | ICD-10-CM | POA: Diagnosis not present

## 2023-06-12 DIAGNOSIS — I10 Essential (primary) hypertension: Secondary | ICD-10-CM | POA: Diagnosis not present

## 2023-06-12 HISTORY — PX: IR GASTROSTOMY TUBE MOD SED: IMG625

## 2023-06-12 LAB — BASIC METABOLIC PANEL WITH GFR
Anion gap: 9 (ref 5–15)
BUN: 9 mg/dL (ref 8–23)
CO2: 26 mmol/L (ref 22–32)
Calcium: 8.7 mg/dL — ABNORMAL LOW (ref 8.9–10.3)
Chloride: 101 mmol/L (ref 98–111)
Creatinine, Ser: 0.58 mg/dL — ABNORMAL LOW (ref 0.61–1.24)
GFR, Estimated: >60 mL/min (ref >60)
Glucose, Bld: 85 mg/dL (ref 70–99)
Potassium: 3.7 mmol/L (ref 3.5–5.1)
Sodium: 136 mmol/L (ref 135–145)

## 2023-06-12 LAB — CBC
HCT: 39.2 % (ref 39.0–52.0)
Hemoglobin: 13.5 g/dL (ref 13.0–17.0)
MCH: 30.1 pg (ref 26.0–34.0)
MCHC: 34.4 g/dL (ref 30.0–36.0)
MCV: 87.3 fL (ref 80.0–100.0)
Platelets: 269 10*3/uL (ref 150–400)
RBC: 4.49 MIL/uL (ref 4.22–5.81)
RDW: 18.6 % — ABNORMAL HIGH (ref 11.5–15.5)
WBC: 7.9 10*3/uL (ref 4.0–10.5)
nRBC: 0 % (ref 0.0–0.2)

## 2023-06-12 LAB — PROTIME-INR
INR: 1.2 (ref 0.8–1.2)
Prothrombin Time: 15 s (ref 11.4–15.2)

## 2023-06-12 LAB — GLUCOSE, CAPILLARY
Glucose-Capillary: 80 mg/dL (ref 70–99)
Glucose-Capillary: 86 mg/dL (ref 70–99)
Glucose-Capillary: 91 mg/dL (ref 70–99)
Glucose-Capillary: 129 mg/dL — ABNORMAL HIGH (ref 70–99)
Glucose-Capillary: 103 mg/dL — ABNORMAL HIGH (ref 70–99)

## 2023-06-12 LAB — HEMOGLOBIN A1C
Hgb A1c MFr Bld: 4.6 % — ABNORMAL LOW (ref 4.8–5.6)
Mean Plasma Glucose: 85.32 mg/dL

## 2023-06-12 MED ORDER — OSMOLITE 1.5 CAL PO LIQD
1000.0000 mL | ORAL | Status: AC
  Administered 2023-06-12 – 2023-06-14 (×2): 1000 mL
  Filled 2023-06-12 (×6): qty 1000

## 2023-06-12 MED ORDER — ONDANSETRON HCL 4 MG PO TABS: 4.0000 mg | ORAL_TABLET | Freq: Four times a day (QID) | ORAL | Status: DC | PRN

## 2023-06-12 MED ORDER — DEXTROSE IN LACTATED RINGERS 5 % IV SOLN: INTRAVENOUS | Status: AC

## 2023-06-12 MED ORDER — MORPHINE SULFATE (PF) 2 MG/ML IV SOLN
1.0000 mg | INTRAVENOUS | Status: DC | PRN
  Administered 2023-06-12 – 2023-06-13 (×4): 1 mg via INTRAVENOUS
  Filled 2023-06-12 (×4): qty 1

## 2023-06-12 MED ORDER — MIDAZOLAM HCL 2 MG/2ML IJ SOLN
INTRAMUSCULAR | Status: AC
  Filled 2023-06-12: qty 2

## 2023-06-12 MED ORDER — ISOSORB DINITRATE-HYDRALAZINE 20-37.5 MG PO TABS
1.0000 | ORAL_TABLET | Freq: Two times a day (BID) | ORAL | Status: DC
  Filled 2023-06-12 (×2): qty 1

## 2023-06-12 MED ORDER — GLUCAGON HCL RDNA (DIAGNOSTIC) 1 MG IJ SOLR
INTRAMUSCULAR | Status: AC
  Filled 2023-06-12: qty 1

## 2023-06-12 MED ORDER — PROSOURCE TF20 ENFIT COMPATIBL EN LIQD
60.0000 mL | Freq: Two times a day (BID) | ENTERAL | Status: DC
  Administered 2023-06-13 – 2023-06-17 (×9): 60 mL
  Filled 2023-06-12 (×10): qty 60

## 2023-06-12 MED ORDER — FENTANYL CITRATE (PF) 100 MCG/2ML IJ SOLN
INTRAMUSCULAR | Status: AC | PRN
  Administered 2023-06-12: 50 ug via INTRAVENOUS

## 2023-06-12 MED ORDER — ISOSORB DINITRATE-HYDRALAZINE 20-37.5 MG PO TABS
1.0000 | ORAL_TABLET | Freq: Two times a day (BID) | ORAL | Status: DC
  Administered 2023-06-12 – 2023-06-17 (×10): 1
  Filled 2023-06-12 (×11): qty 1

## 2023-06-12 MED ORDER — FENTANYL CITRATE (PF) 100 MCG/2ML IJ SOLN
INTRAMUSCULAR | Status: AC
  Filled 2023-06-12: qty 2

## 2023-06-12 MED ORDER — LIDOCAINE-EPINEPHRINE 1 %-1:100000 IJ SOLN
20.0000 mL | Freq: Once | INTRAMUSCULAR | Status: AC
  Administered 2023-06-12: 8 mL via INTRADERMAL
  Filled 2023-06-12: qty 20

## 2023-06-12 MED ORDER — IOHEXOL 300 MG/ML  SOLN
50.0000 mL | Freq: Once | INTRAMUSCULAR | Status: AC | PRN
  Administered 2023-06-12: 20 mL

## 2023-06-12 MED ORDER — CEFAZOLIN SODIUM-DEXTROSE 2-4 GM/100ML-% IV SOLN
INTRAVENOUS | Status: AC
  Filled 2023-06-12: qty 100

## 2023-06-12 MED ORDER — ONDANSETRON HCL 4 MG/2ML IJ SOLN: 4.0000 mg | Freq: Four times a day (QID) | INTRAMUSCULAR | Status: DC | PRN

## 2023-06-12 MED ORDER — POLYETHYLENE GLYCOL 3350 17 G PO PACK
17.0000 g | PACK | Freq: Every day | ORAL | Status: DC | PRN
  Filled 2023-06-12: qty 1

## 2023-06-12 MED ORDER — MIDAZOLAM HCL 2 MG/2ML IJ SOLN
INTRAMUSCULAR | Status: AC | PRN
  Administered 2023-06-12: 1 mg via INTRAVENOUS

## 2023-06-12 MED ORDER — GLUCAGON HCL (RDNA) 1 MG IJ SOLR
INTRAMUSCULAR | Status: AC | PRN
  Administered 2023-06-12: 1 mg via INTRAVENOUS

## 2023-06-12 MED ORDER — CEFAZOLIN SODIUM-DEXTROSE 2-4 GM/100ML-% IV SOLN
2.0000 g | Freq: Once | INTRAVENOUS | Status: AC
  Administered 2023-06-12: 2 g via INTRAVENOUS

## 2023-06-12 MED ORDER — LIDOCAINE HCL URETHRAL/MUCOSAL 2 % EX GEL
1.0000 | Freq: Once | CUTANEOUS | Status: AC
  Administered 2023-06-12: 1 via TOPICAL
  Filled 2023-06-12: qty 5

## 2023-06-12 MED ORDER — ACETAMINOPHEN 325 MG PO TABS
650.0000 mg | ORAL_TABLET | Freq: Four times a day (QID) | ORAL | Status: DC | PRN
Start: 2023-06-12 — End: 2023-06-17
  Administered 2023-06-14 – 2023-06-16 (×2): 650 mg
  Filled 2023-06-12 (×3): qty 2

## 2023-06-12 MED ORDER — SENNOSIDES-DOCUSATE SODIUM 8.6-50 MG PO TABS
1.0000 | ORAL_TABLET | Freq: Every day | ORAL | Status: DC | PRN
  Filled 2023-06-12: qty 1

## 2023-06-12 MED ORDER — ACETAMINOPHEN 650 MG RE SUPP: 650.0000 mg | Freq: Four times a day (QID) | RECTAL | Status: DC | PRN

## 2023-06-12 MED ORDER — SODIUM CHLORIDE 0.9 % IV SOLN
INTRAVENOUS | Status: AC | PRN
  Administered 2023-06-12: 50 mL/h via INTRAVENOUS

## 2023-06-12 NOTE — Plan of Care (Signed)

## 2023-06-12 NOTE — Procedures (Signed)
 Interventional Radiology Procedure Note  Procedure: Placement of percutaneous 62F push-through balloon retention gastrostomy tube.  Complications: None  Recommendations: - Tube ready for use tomorrow am   Signed,  Roxie Cord, MD

## 2023-06-12 NOTE — Plan of Care (Signed)

## 2023-06-12 NOTE — Sedation Documentation (Signed)
 OG placed by Dr Marne Sings.

## 2023-06-12 NOTE — Assessment & Plan Note (Addendum)
 history of pharyngeal cancer, 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  He was able to achieve remission.  He states in December 2024, he started having difficulty swallowing.  This became progressively worse.  At this point, he vomits if he tries to eat and gets "choked."  He does get some pain in the throat when he vomits.  He denies nausea and vomiting.  He is able to have normal bowel movements.  Denies constipation or diarrhea.  Has gradually become unable to tolerate solid food by mouth, even thickened liquids such as milkshakes or applesauce.  Sometimes, he is able to sip on Boost protein supplement shakes.  He reports a 40 to 50 pound weight loss since dysphagia started in December 2024. He saw Dr. Donalee Fruits, ENT.  Patient underwent endoscopy procedure.  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  The patient is to be admitted for urgent PEG tube placement and will begin to receive parenteral nutrition through PEG tube.  Will get PET scan or CT CAP to determine distant metastases which will aid in determining appropriate treatment.  Discussed with the patient that treatment will likely consist of chemotherapy and possibly radiation.  Surgery is not likely to be recommended due to location of malignancy.  He states he would like to proceed with treatment, what ever treatment is recommended at this time.

## 2023-06-12 NOTE — Plan of Care (Addendum)
 Osmolite 1.5 initiated via kangaroo pump continuously per dietician order. Tube is patent, clean, dry, intact with split gauze and tape in place. Patient alert and oriented, educated on tube feeds and kangaroo pump, tube feeds, and water flushes. No concerns atm.  Problem: Education: Goal: Knowledge of General Education information will improve Description: Including pain rating scale, medication(s)/side effects and non-pharmacologic comfort measures 06/12/2023 1805 by Mozqueda Jaramillo, Moody Appl, RN Outcome: Progressing 06/12/2023 1014 by Mozqueda Jaramillo, Moody Appl, RN Outcome: Progressing   Problem: Health Behavior/Discharge Planning: Goal: Ability to manage health-related needs will improve 06/12/2023 1805 by Mozqueda Jaramillo, Moody Appl, RN Outcome: Progressing 06/12/2023 1014 by Mozqueda Jaramillo, Moody Appl, RN Outcome: Progressing   Problem: Clinical Measurements: Goal: Ability to maintain clinical measurements within normal limits will improve 06/12/2023 1805 by Uvaldo Garfinkel, Moody Appl, RN Outcome: Progressing 06/12/2023 1014 by Mozqueda Jaramillo, Setareh Rom Alondra, RN Outcome: Progressing Goal: Will remain free from infection 06/12/2023 1805 by Mozqueda Jaramillo, Moody Appl, RN Outcome: Progressing 06/12/2023 1014 by Mozqueda Jaramillo, Moody Appl, RN Outcome: Progressing Goal: Diagnostic test results will improve 06/12/2023 1805 by Mozqueda Jaramillo, Moody Appl, RN Outcome: Progressing 06/12/2023 1014 by Mozqueda Jaramillo, Jazmyne Beauchesne Alondra, RN Outcome: Progressing Goal: Respiratory complications will improve 06/12/2023 1805 by Mozqueda Jaramillo, Dimple Bastyr Alondra, RN Outcome: Progressing 06/12/2023 1014 by Mozqueda Jaramillo, Pawan Knechtel Alondra, RN Outcome: Progressing Goal: Cardiovascular complication will be avoided 06/12/2023 1805 by Mozqueda Jaramillo, Yanci Bachtell Alondra, RN Outcome: Progressing 06/12/2023 1014 by Mozqueda Jaramillo,  Lyssa Hackley Alondra, RN Outcome: Progressing   Problem: Activity: Goal: Risk for activity intolerance will decrease 06/12/2023 1805 by Mozqueda Jaramillo, Moody Appl, RN Outcome: Progressing 06/12/2023 1014 by Mozqueda Jaramillo, Moody Appl, RN Outcome: Progressing   Problem: Nutrition: Goal: Adequate nutrition will be maintained 06/12/2023 1805 by Mozqueda Jaramillo, Moody Appl, RN Outcome: Progressing 06/12/2023 1014 by Mozqueda Jaramillo, Moody Appl, RN Outcome: Progressing   Problem: Coping: Goal: Level of anxiety will decrease 06/12/2023 1805 by Mozqueda Jaramillo, Moody Appl, RN Outcome: Progressing 06/12/2023 1014 by Mozqueda Jaramillo, Moody Appl, RN Outcome: Progressing   Problem: Elimination: Goal: Will not experience complications related to bowel motility 06/12/2023 1805 by Mozqueda Jaramillo, Moody Appl, RN Outcome: Progressing 06/12/2023 1014 by Mozqueda Jaramillo, Moody Appl, RN Outcome: Progressing Goal: Will not experience complications related to urinary retention 06/12/2023 1805 by Mozqueda Jaramillo, Moody Appl, RN Outcome: Progressing 06/12/2023 1014 by Mozqueda Jaramillo, Moody Appl, RN Outcome: Progressing   Problem: Pain Managment: Goal: General experience of comfort will improve and/or be controlled 06/12/2023 1805 by Uvaldo Garfinkel, Moody Appl, RN Outcome: Progressing 06/12/2023 1014 by Mozqueda Jaramillo, Moody Appl, RN Outcome: Progressing   Problem: Safety: Goal: Ability to remain free from injury will improve 06/12/2023 1805 by Uvaldo Garfinkel, Moody Appl, RN Outcome: Progressing 06/12/2023 1014 by Mozqueda Jaramillo, Reverie Vaquera Alondra, RN Outcome: Progressing   Problem: Skin Integrity: Goal: Risk for impaired skin integrity will decrease 06/12/2023 1805 by Uvaldo Garfinkel, Moody Appl, RN Outcome: Progressing 06/12/2023 1014 by Mozqueda Jaramillo, Moody Appl, RN Outcome: Progressing    Problem: Education: Goal: Ability to describe self-care measures that may prevent or decrease complications (Diabetes Survival Skills Education) will improve 06/12/2023 1805 by Uvaldo Garfinkel, Moody Appl, RN Outcome: Progressing 06/12/2023 1014 by Mozqueda Jaramillo, Zayonna Ayuso Alondra, RN Outcome: Progressing Goal: Individualized Educational Video(s) 06/12/2023 1805 by Mozqueda Jaramillo, Moody Appl, RN Outcome: Progressing 06/12/2023 1014 by Mozqueda Jaramillo, Lelania Bia Alondra, RN Outcome: Progressing   Problem: Coping: Goal: Ability to adjust to condition or change in health will improve 06/12/2023 1805 by Mozqueda Jaramillo,  Moody Appl, RN Outcome: Progressing 06/12/2023 1014 by Mozqueda Jaramillo, Moody Appl, RN Outcome: Progressing   Problem: Fluid Volume: Goal: Ability to maintain a balanced intake and output will improve 06/12/2023 1805 by Uvaldo Garfinkel, Moody Appl, RN Outcome: Progressing 06/12/2023 1014 by Mozqueda Jaramillo, Moody Appl, RN Outcome: Progressing   Problem: Health Behavior/Discharge Planning: Goal: Ability to identify and utilize available resources and services will improve 06/12/2023 1805 by Uvaldo Garfinkel, Moody Appl, RN Outcome: Progressing 06/12/2023 1014 by Mozqueda Jaramillo, Moody Appl, RN Outcome: Progressing Goal: Ability to manage health-related needs will improve 06/12/2023 1805 by Uvaldo Garfinkel, Moody Appl, RN Outcome: Progressing 06/12/2023 1014 by Mozqueda Jaramillo, Moody Appl, RN Outcome: Progressing   Problem: Metabolic: Goal: Ability to maintain appropriate glucose levels will improve 06/12/2023 1805 by Uvaldo Garfinkel, Moody Appl, RN Outcome: Progressing 06/12/2023 1014 by Mozqueda Jaramillo, Akhila Mahnken Alondra, RN Outcome: Progressing   Problem: Nutritional: Goal: Maintenance of adequate nutrition will improve 06/12/2023 1805 by Mozqueda Jaramillo, Moody Appl, RN Outcome:  Progressing 06/12/2023 1014 by Mozqueda Jaramillo, Moody Appl, RN Outcome: Progressing Goal: Progress toward achieving an optimal weight will improve 06/12/2023 1805 by Mozqueda Jaramillo, Diona Peregoy Alondra, RN Outcome: Progressing 06/12/2023 1014 by Mozqueda Jaramillo, Nicolet Griffy Alondra, RN Outcome: Progressing   Problem: Skin Integrity: Goal: Risk for impaired skin integrity will decrease 06/12/2023 1805 by Mozqueda Jaramillo, Ashonti Leandro Alondra, RN Outcome: Progressing 06/12/2023 1014 by Mozqueda Jaramillo, Ailish Prospero Alondra, RN Outcome: Progressing   Problem: Tissue Perfusion: Goal: Adequacy of tissue perfusion will improve 06/12/2023 1805 by Mozqueda Jaramillo, Kamarie Palma Alondra, RN Outcome: Progressing 06/12/2023 1014 by Mozqueda Jaramillo, Perez Dirico Alondra, RN Outcome: Progressing

## 2023-06-12 NOTE — Progress Notes (Signed)
 Pt placed on hospital Resmed CPAP machine at this time. Pt is tolerating well. No distress noted. Will continue to monitor    06/12/23 2304  BiPAP/CPAP/SIPAP  $ Non-Invasive Home Ventilator  Initial  $ Face Mask Medium Yes  BiPAP/CPAP/SIPAP Pt Type Adult  BiPAP/CPAP/SIPAP Resmed  Mask Type Full face mask  Dentures removed? Not applicable  Mask Size Medium  Patient Home Machine No  Patient Home Mask No  Patient Home Tubing No  Auto Titrate Yes  CPAP/SIPAP surface wiped down Yes  Device Plugged into RED Power Outlet Yes  BiPAP/CPAP /SiPAP Vitals  Pulse Rate 88  Resp 20  SpO2 95 %  Bilateral Breath Sounds Clear;Diminished  MEWS Score/Color  MEWS Score 0  MEWS Score Color Marrie Sizer

## 2023-06-12 NOTE — Hospital Course (Addendum)
 Melvin Hudson is a 68 y.o. male with a history of CHF, diabetes mellitus, OSA, primary hypertension, progressive dysphagia, squamous cell carcinoma of the esophagus.  Patient presented secondary to need for PEG tube placement. PEG tube placed on 4/25.

## 2023-06-12 NOTE — Progress Notes (Signed)
 Initial Nutrition Assessment  INTERVENTION:   Monitor magnesium, potassium, and phosphorus  for at least 3 days, MD to replete as needed, as pt is at risk for refeeding syndrome. -Recommend 100 mg Thiamine daily x 5 days via IV or tube  Once tube is placed and deemed ready to use: -Initiate Osmolite 1.5 @ 20 ml/hr, advance by 10 ml every 12 hours to goal rate of 70 ml/hr. -60 ml Prosource TF 20 BID -Provides 2680 kcals, 145g protein and 1280 ml H2O -Free water recommendations: 150 ml every 4 hours (900 ml)  Once ready to transition to bolus gravity feeding: -Provide 1 carton (237 ml) Osmolite 1.5 QID -Increase once tolerating to 7.5 cartons total daily of Osmolite 1.5 -Providing 2662 kcals, 111g protein and 1357 ml H2O   NUTRITION DIAGNOSIS:   Moderate Malnutrition related to chronic illness, cancer and cancer related treatments, dysphagia as evidenced by mild muscle depletion, energy intake < or equal to 75% for > or equal to 1 month, percent weight loss.  GOAL:   Patient will meet greater than or equal to 90% of their needs  MONITOR:   Labs, TF tolerance  REASON FOR ASSESSMENT:   Consult Assessment of nutrition requirement/status, Enteral/tube feeding initiation and management  ASSESSMENT:   68 y.o. male with a history of CHF, diabetes mellitus, OSA, primary hypertension, progressive dysphagia, squamous cell carcinoma of the esophagus.  Patient presented secondary to need for PEG tube placement.  Received consult to start tube feeds with this patient. Per RN, pt still awaiting IR placement of PEG tube.  Pt with progressively worsening dysphagia r/t esophageal cancer. Pt reports subsisting on at most 1 Boost, some tomato or mushroom soups. Symptoms began in December 2024. Progressed where pt would choke and vomit on intakes. Per oncology plan will be to start chemoradiation if negative for mets. Will await plan. Discussed tube feeding initiation with MD, RN and pt at  bedside. Will plan to initiate continuous feeds until goal reached then will transition to bolus gravity regimen.   Per weight records, pt has lost 51 lbs since 10/16/22( 17% wt loss x 8 months, significant for time frame).  Medications: D5 infusion  Lab reviewed: CBGs: 80-91   NUTRITION - FOCUSED PHYSICAL EXAM:  Flowsheet Row Most Recent Value  Orbital Region Mild depletion  Upper Arm Region No depletion  Thoracic and Lumbar Region No depletion  Buccal Region No depletion  Temple Region Mild depletion  Clavicle Bone Region No depletion  Clavicle and Acromion Bone Region No depletion  Scapular Bone Region No depletion  Dorsal Hand Mild depletion  Patellar Region No depletion  Anterior Thigh Region No depletion  Posterior Calf Region No depletion  Edema (RD Assessment) None  Hair Reviewed  Eyes Reviewed  Mouth Reviewed  [missing teeth]  Skin Reviewed  Nails Reviewed       Diet Order:   Diet Order             Diet NPO time specified  Diet effective now                   EDUCATION NEEDS:   Education needs have been addressed  Skin:  Skin Assessment: Reviewed RN Assessment  Last BM:  4/22  Height:   Ht Readings from Last 1 Encounters:  06/12/23 6\' 2"  (1.88 m)    Weight:   Wt Readings from Last 1 Encounters:  06/12/23 112.5 kg    BMI:  Body mass index is 31.84 kg/m.  Estimated Nutritional Needs:   Kcal:  2500-2700  Protein:  130-145g  Fluid:  >2L/day   Arna Better, MS, RD, LDN Inpatient Clinical Dietitian Contact via Secure chat

## 2023-06-12 NOTE — Consult Note (Addendum)
 Chief Complaint: Patient was seen in consultation today for dysphagia, weight loss  at the request of Boscia,Heather E  Referring Physician(s): Boscia,Heather E  Supervising Physician: Fernando Hoyer  Patient Status: [facility] - In-pt  Full Code  History of Present Illness: Melvin Hudson is a 68 y.o. male with history of CHF, diabetes, hypertension, OSA and previous SCC of the right tonsil-achieved remission. Patient is currently admitted to [facility] long hospital for c/o dysphagia and significant weight loss.  Oncology requested direct admission to the hospital due to need for a feeding tube. Patient has evidence of newly diagnosed esophageal squamous cell carcinoma from a recent PET scan 06/11/23.  Patient reports that he has experience weight loss and difficulty eating. Denies: chest pain, shortness of breath, nausea, vomiting, abdominal pain, diarrhea, and/or fever.      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    Past Surgical History:  Procedure Laterality Date   COLONOSCOPY Left 04/11/2014   Procedure: COLONOSCOPY;  Surgeon: Evangeline Hilts, MD;  Location: [facility] ENDOSCOPY;  Service: Endoscopy;  Laterality: Left;   COLONOSCOPY N/A 04/20/2014   Procedure: COLONOSCOPY;  Surgeon: Yvetta Herbert, MD;  Location: [facility] ENDOSCOPY;  Service: Endoscopy;  Laterality: N/A;   COLONOSCOPY W/ BIOPSIES AND POLYPECTOMY     benign   DIRECT LARYNGOSCOPY  01/05/2014   DIRECT LARYNGOSCOPY N/A 01/05/2014    Procedure: DIRECT LARYNGOSCOPY AND BIOPSY;  Surgeon: Janita Mellow, MD;  Location: [facility] OR;  Service: ENT;  Laterality: N/A;   ESOPHAGOSCOPY WITH DILITATION N/A 05/27/2023   Procedure: ESOPHAGOSCOPY;  Surgeon: Janita Mellow, MD;  Location: [facility] OR;  Service: ENT;  Laterality: N/A;   KNEE ARTHROSCOPY Right ~ 1994   LEFT AND RIGHT HEART CATHETERIZATION WITH CORONARY/GRAFT ANGIOGRAM N/A 12/31/2010   Procedure: LEFT AND RIGHT HEART CATHETERIZATION WITH Estella Helling;  Surgeon: Jessica Morn, MD;  Location: [facility] CATH LAB;  Service: Cardiovascular;  Laterality: N/A;   MULTIPLE EXTRACTIONS WITH ALVEOLOPLASTY N/A 07/08/2013   Procedure: Extraction of tooth #'s 22, 27 with alveoloplasty and bilateal mandibular facial exostoses reductions;  Surgeon: Carol Chroman, DDS;  Location: [facility] ORS;  Service: Oral Surgery;  Laterality: N/A;   MULTIPLE TOOTH EXTRACTIONS  ~ 2008    Allergies: Azilsartan and Advil [ibuprofen]  Medications: Prior to Admission medications   Medication Sig Start Date End Date Taking? Authorizing Provider  albuterol  (VENTOLIN  HFA) 108 (90 Base) MCG/ACT inhaler Inhale 2 puffs into the lungs every 4 (four) hours as needed for wheezing (Cough). 04/02/23  Yes [provider]  allopurinol  (ZYLOPRIM ) 300 MG tablet Take 300 mg by mouth every other day.   Yes [provider]  atorvastatin  (LIPITOR) 40 MG tablet Take 1 tablet (40 mg total) by mouth at bedtime. Patient taking differently: Take 40 mg by mouth in the morning. 10/07/19 06/11/23 Yes [name], [name], DO  bismuth subsalicylate (PEPTO BISMOL) 262 MG/15ML suspension Take 30 mLs by mouth every  6 (six) hours as needed for indigestion or diarrhea or loose stools.   Yes [provider]  calcium  carbonate (TUMS - DOSED IN MG ELEMENTAL CALCIUM ) 500 MG chewable tablet Chew 2 tablets (400 mg of elemental calcium  total) by mouth 3 (three) times daily with meals. Patient taking differently: Chew 400 mg of elemental  calcium  by mouth daily as needed. 04/18/22  Yes Cleven Dallas, DO  carvedilol  (COREG ) 25 MG tablet Take 25 mg by mouth 2 (two) times daily with a meal.   Yes [provider]  clonazePAM  (KLONOPIN ) 1 MG disintegrating tablet Take 1 mg by mouth daily. 03/10/22  Yes [provider]  dapagliflozin  propanediol (FARXIGA ) 10 MG TABS tablet Take 10 mg by mouth in the morning.   Yes [provider]  diphenhydrAMINE  (BENADRYL ) 25 MG tablet Take 1 tablet (25 mg total) by mouth every 6 (six) hours. Patient taking differently: Take 25 mg by mouth every 6 (six) hours as needed for allergies or sleep. 10/29/13  Yes Trish Furl, MD  gabapentin  (NEURONTIN ) 300 MG capsule Take 300 mg by mouth 3 (three) times daily.  01/19/18  Yes [provider]  hydrocortisone  (ANUSOL -HC) 2.5 % rectal cream Place 1 Application rectally daily as needed for hemorrhoids or anal itching. 04/29/23  Yes [provider]  isosorbide -hydrALAZINE  (BIDIL ) 20-37.5 MG tablet Take 1 tablet by mouth in the morning and at bedtime. 04/20/23  Yes [name], [name], DO  lactose free nutrition (BOOST) LIQD Take 237 mLs by mouth daily.   Yes [provider]  lidocaine  (XYLOCAINE ) 2 % solution Patient: Mix 1part 2% viscous lidocaine , 1part H20. Swallow 10mL of diluted mixture, before meals and at bedtime, up to QID 06/08/23  Yes Colie Dawes, MD  NIFEdipine  (PROCARDIA  XL/NIFEDICAL-XL) 90 MG 24 hr tablet Take 90 mg by mouth at bedtime.   Yes [provider]  omeprazole  (PRILOSEC) 20 MG capsule Take 20 mg by mouth daily.  06/04/16  Yes [provider]  polyethylene glycol (MIRALAX  / GLYCOLAX ) 17 g packet Take 17 g by mouth daily. Patient taking differently: Take 17 g by mouth daily as needed. 04/19/22  Yes Cleven Dallas, DO  psyllium (HYDROCIL/METAMUCIL) 95 % PACK Take 1 packet by mouth daily. Patient taking differently: Take 1 packet by mouth daily as needed for mild constipation or moderate  constipation. 04/15/22  Yes Cleven Dallas, DO  senna-docusate (SENOKOT-S) 8.6-50 MG tablet Take 1 tablet by mouth 2 (two) times daily. Patient taking differently: Take 1 tablet by mouth daily as needed for mild constipation or moderate constipation. 04/18/22  Yes Cleven Dallas, DO  spironolactone  (ALDACTONE ) 25 MG tablet Take 1 tablet (25 mg total) by mouth in the morning. 04/20/23  Yes [name], [name], DO  Blood Glucose Monitoring Suppl (ONETOUCH VERIO FLEX SYSTEM) w/Device KIT USE TO CHECK BLOOD SUGAR TWICE DAILY 04/21/19   [provider]  ONETOUCH VERIO test strip USE TO CHECK BLOOD SUGAR TWICE DAILY 07/20/19   [provider]     Family History  Problem Relation Age of Onset   Cancer Mother    Stroke Mother    Hypertension Mother    Diabetes Father    Aneurysm Father    Cancer Sister        breast ca    Social History   Socioeconomic History   Marital status: Widowed    Spouse name: Not on file   Number of children: 0   Years of education: Not on file   Highest  education level: Not on file  Occupational History   Not on file  Tobacco Use   Smoking status: Former    Current packs/day: 0.00    Average packs/day: 2.0 packs/day for 42.0 years (84.0 ttl pk-yrs)    Types: Cigarettes, Cigars    Start date: 06/18/1971    Quit date: 06/17/2013    Years since quitting: 9.9   Smokeless tobacco: Never  Vaping Use   Vaping status: Never Used  Substance and Sexual Activity   Alcohol  use: Never   Drug use: No   Sexual activity: Not Currently  Other Topics Concern   Not on file  Social History Narrative   Not on file   Social Drivers of Health   Financial Resource Strain: Not on file  Food Insecurity: No Food Insecurity (06/11/2023)   Hunger Vital Sign    Worried About Running Out of Food in the Last Year: Never true    Ran Out of Food in the Last Year: Never true  Transportation Needs: Unmet Transportation Needs (06/11/2023)   PRAPARE - Scientist, research (physical sciences) (Medical): Yes    Lack of Transportation (Non-Medical): Yes  Physical Activity: Not on file  Stress: Not on file  Social Connections: Socially Isolated (06/11/2023)   Social Connection and Isolation Panel [NHANES]    Frequency of Communication with Friends and Family: More than three times a week    Frequency of Social Gatherings with Friends and Family: More than three times a week    Attends Religious Services: Never    Database administrator or Organizations: No    Attends Banker Meetings: Never    Marital Status: Widowed    Review of Systems: A 12 point ROS discussed and pertinent positives are indicated in the HPI above.  All other systems are negative.  Review of Systems  Constitutional:  Negative for fever.  Respiratory:  Negative for shortness of breath.   Cardiovascular:  Negative for chest pain.  Gastrointestinal:  Negative for abdominal pain, diarrhea, nausea and vomiting.  Psychiatric/Behavioral:  Negative for confusion.     Vital Signs: BP (!) 142/104 (BP Location: Left Arm)   Pulse 62   Temp 97.8 F (36.6 C) (Oral)   Resp 20   Ht 6\' 2"  (1.88 m)   Wt 247 lb 15.9 oz (112.5 kg)   SpO2 98%   BMI 31.84 kg/m   Advance Care Plan: The advanced care plan/surrogate decision maker was discussed at the time of visit and documented in the medical record.    Physical Exam HENT:     Head: Normocephalic and atraumatic.     Mouth/Throat:     Mouth: Mucous membranes are moist.     Pharynx: Oropharynx is clear.  Cardiovascular:     Rate and Rhythm: Normal rate and regular rhythm.  Pulmonary:     Effort: Pulmonary effort is normal.     Breath sounds: Normal breath sounds.  Abdominal:     General: Abdomen is flat.  Skin:    General: Skin is warm.  Neurological:     General: No focal deficit present.     Mental Status: He is alert and oriented to person, place, and time.  Psychiatric:        Mood and Affect: Mood normal.        Thought  Content: Thought content normal.     Imaging: No results found.  Labs:  CBC: Recent Labs    05/27/23 [phone]  06/12/23 0505  WBC 9.3 7.9  HGB 14.6 13.5  HCT 41.9 39.2  PLT 330 269    COAGS: Recent Labs    06/12/23 0505  INR 1.2    BMP: Recent Labs    04/27/23 0829 05/27/23 0649 06/12/23 0505  NA 139 138 136  K 4.4 4.0 3.7  CL 100 102 101  CO2 24 25 26   GLUCOSE 90 88 85  BUN 6* 6* 9  CALCIUM  9.7 9.2 8.7*  CREATININE 0.92 0.90 0.58*  GFRNONAA  --  >60 >60    LIVER FUNCTION TESTS: Recent Labs    04/27/23 0829  BILITOT 1.1  AST 15  ALT 10  ALKPHOS 177*  PROT 7.1  ALBUMIN 4.3    TUMOR MARKERS: No results for input(s): "AFPTM", "CEA", "CA199", "CHROMGRNA" in the last 8760 hours.  Assessment and Plan: Dysphagia, weight loss.  Patient is a 68 y/o male with with history of CHF, diabetes, hypertension, OSA and SCC of the right tonsil-achieved remission. Patient has newly diagnosed squamous cell carcinoma of the esophagus. IR requested for G tube insertion due to profound weight loss and dysphagia. Patient is NPO and is not taking anticoagulants.   Risks and benefits image guided gastrostomy tube placement was discussed with the patient including, but not limited to the need for a barium enema during the procedure, bleeding, infection, peritonitis and/or damage to adjacent structures.  All of the patient's questions were answered, patient is agreeable to proceed.  Consent signed and in chart.    Thank you for this interesting consult.  I greatly enjoyed meeting Melvin Hudson and look forward to participating in their care.  A copy of this report was sent to the requesting provider on this date.  Electronically Signed: Lawrance Presume, PA 06/12/2023, 3:07 PM   I spent a total of 20 Minutes    in face to face in clinical consultation, greater than 50% of which was counseling/coordinating care for dysphagia, weight loss.

## 2023-06-12 NOTE — Progress Notes (Signed)
   PROGRESS NOTE    [name]  WGN:[id number] DOB: 06-30-1955 DOA: 06/11/2023 PCP: Lorella Roles, MD   Brief Narrative: Melvin Hudson is a 68 y.o. male with a history of CHF, diabetes mellitus, OSA, primary hypertension, progressive dysphagia, squamous cell carcinoma of the esophagus.  Patient presented secondary to need for PEG tube placement.   Assessment and Plan:  Progressive dysphagia Secondary to esophageal squamous cell carcinoma. Patient admitted for PEG tube placement. -Continue IV fluids -Follow-up IR for PEG tube -Dietitian consultation  GERD -Continue Protonix   Primary hypertension -Resume home Imdur   DVT prophylaxis: SCDs Code Status:   Code Status: Full Code Family Communication: Sister at bedside Disposition Plan: Discharge home likely in 4-5 days pending PEG placement and toleration of tube feeds   Consultants:  Interventional radiology  Procedures:  None  Antimicrobials: None    Subjective: Patient without new concerns. Continued dysphagia.  Objective: BP (!) 167/92 (BP Location: Left Arm) Comment: MD Notified  Pulse 62   Temp 97.8 F (36.6 C) (Oral)   Resp 20   Ht 6\' 2"  (1.88 m)   Wt 112.5 kg   SpO2 98%   BMI 31.84 kg/m   Examination:  General exam: Appears calm and comfortable Respiratory system: Clear to auscultation. Respiratory effort normal. Cardiovascular system: S1 & S2 heard, RRR. No murmurs, rubs, gallops or clicks. Gastrointestinal system: Abdomen is nondistended, soft. Normal bowel sounds heard. Central nervous system: Alert and oriented. No focal neurological deficits. Psychiatry: Judgement and insight appear normal. Mood & affect appropriate.    Data Reviewed: I have personally reviewed following labs and imaging studies  CBC Lab Results  Component Value Date   WBC 7.9 06/12/2023   RBC 4.49 06/12/2023   HGB 13.5 06/12/2023   HCT 39.2 06/12/2023   MCV 87.3 06/12/2023   MCH 30.1 06/12/2023   PLT 269  06/12/2023   MCHC 34.4 06/12/2023   RDW 18.6 (H) 06/12/2023   LYMPHSABS 1.3 04/15/2022   MONOABS 1.0 04/15/2022   EOSABS 0.4 04/15/2022   BASOSABS 0.0 04/15/2022     Last metabolic panel Lab Results  Component Value Date   NA 136 06/12/2023   K 3.7 06/12/2023   CL 101 06/12/2023   CO2 26 06/12/2023   BUN 9 06/12/2023   CREATININE 0.58 (L) 06/12/2023   GLUCOSE 85 06/12/2023   GFRNONAA >60 06/12/2023   GFRAA 104 04/10/2020   CALCIUM  8.7 (L) 06/12/2023   PHOS 2.9 04/16/2022   PROT 7.1 04/27/2023   ALBUMIN 4.3 04/27/2023   LABGLOB 2.8 04/27/2023   AGRATIO 2.0 11/13/2020   BILITOT 1.1 04/27/2023   ALKPHOS 177 (H) 04/27/2023   AST 15 04/27/2023   ALT 10 04/27/2023   ANIONGAP 9 06/12/2023    GFR: Estimated Creatinine Clearance: 116.2 mL/min (A) (by C-G formula based on SCr of 0.58 mg/dL (L)).  No results found for this or any previous visit (from the past 240 hours).    Radiology Studies: No results found.    LOS: 1 day    Aneita Keens, MD [facility] 06/12/2023, 1:45 PM   If 7PM-7AM, please contact night-coverage [email or web address]

## 2023-06-13 DIAGNOSIS — R1319 Other dysphagia: Secondary | ICD-10-CM | POA: Diagnosis not present

## 2023-06-13 DIAGNOSIS — I10 Essential (primary) hypertension: Secondary | ICD-10-CM | POA: Diagnosis not present

## 2023-06-13 LAB — MAGNESIUM: Magnesium: 2.3 mg/dL (ref 1.7–2.4)

## 2023-06-13 LAB — GLUCOSE, CAPILLARY
Glucose-Capillary: 117 mg/dL — ABNORMAL HIGH (ref 70–99)
Glucose-Capillary: 121 mg/dL — ABNORMAL HIGH (ref 70–99)
Glucose-Capillary: 131 mg/dL — ABNORMAL HIGH (ref 70–99)
Glucose-Capillary: 159 mg/dL — ABNORMAL HIGH (ref 70–99)
Glucose-Capillary: 163 mg/dL — ABNORMAL HIGH (ref 70–99)
Glucose-Capillary: 89 mg/dL (ref 70–99)

## 2023-06-13 LAB — PHOSPHORUS: Phosphorus: 2.1 mg/dL — ABNORMAL LOW (ref 2.5–4.6)

## 2023-06-13 MED ORDER — K PHOS MONO-SOD PHOS DI & MONO 155-852-130 MG PO TABS
500.0000 mg | ORAL_TABLET | Freq: Three times a day (TID) | ORAL | Status: AC
  Administered 2023-06-13 – 2023-06-14 (×3): 500 mg
  Filled 2023-06-13 (×3): qty 2

## 2023-06-13 MED ORDER — OXYCODONE HCL 5 MG/5ML PO SOLN
5.0000 mg | ORAL | Status: DC | PRN
  Administered 2023-06-13 – 2023-06-16 (×6): 5 mg
  Filled 2023-06-13 (×6): qty 5

## 2023-06-13 MED ORDER — FAMOTIDINE 40 MG/5ML PO SUSR
20.0000 mg | Freq: Every day | ORAL | Status: DC
  Administered 2023-06-13 – 2023-06-17 (×5): 20 mg
  Filled 2023-06-13 (×5): qty 2.5

## 2023-06-13 MED ORDER — MORPHINE SULFATE (PF) 2 MG/ML IV SOLN
2.0000 mg | INTRAVENOUS | Status: DC | PRN
  Administered 2023-06-13 – 2023-06-16 (×6): 2 mg via INTRAVENOUS
  Filled 2023-06-13 (×6): qty 1

## 2023-06-13 NOTE — Plan of Care (Signed)
  Problem: Education: Goal: Knowledge of General Education information will improve Description: Including pain rating scale, medication(s)/side effects and non-pharmacologic comfort measures Outcome: Progressing   Problem: Activity: Goal: Risk for activity intolerance will decrease Outcome: Progressing   Problem: Nutrition: Goal: Adequate nutrition will be maintained Outcome: Progressing   Problem: Elimination: Goal: Will not experience complications related to bowel motility Outcome: Progressing   Problem: Skin Integrity: Goal: Risk for impaired skin integrity will decrease Outcome: Progressing

## 2023-06-13 NOTE — Progress Notes (Signed)
 Referring Physician(s): Boscia,Heather E  Supervising Physician: Art Largo  Patient Status:  [facility] - In-pt  Chief Complaint: Esophageal carcinoma  Subjective: Underwent gastrostomy tube palcement yesterday with Dr. Marne Sings.  Reports mild abdominal pain, particularly with movement.  TFs infusing without issue.   Allergies: Azilsartan and Advil [ibuprofen]  Medications: Prior to Admission medications   Medication Sig Start Date End Date Taking? Authorizing Provider  albuterol  (VENTOLIN  HFA) 108 (90 Base) MCG/ACT inhaler Inhale 2 puffs into the lungs every 4 (four) hours as needed for wheezing (Cough). 04/02/23  Yes [provider]  allopurinol  (ZYLOPRIM ) 300 MG tablet Take 300 mg by mouth every other day.   Yes [provider]  atorvastatin  (LIPITOR) 40 MG tablet Take 1 tablet (40 mg total) by mouth at bedtime. Patient taking differently: Take 40 mg by mouth in the morning. 10/07/19 06/11/23 Yes [name], [name], DO  bismuth subsalicylate (PEPTO BISMOL) 262 MG/15ML suspension Take 30 mLs by mouth every 6 (six) hours as needed for indigestion or diarrhea or loose stools.   Yes [provider]  calcium  carbonate (TUMS - DOSED IN MG ELEMENTAL CALCIUM ) 500 MG chewable tablet Chew 2 tablets (400 mg of elemental calcium  total) by mouth 3 (three) times daily with meals. Patient taking differently: Chew 400 mg of elemental calcium  by mouth daily as needed. 04/18/22  Yes Cleven Dallas, DO  carvedilol  (COREG ) 25 MG tablet Take 25 mg by mouth 2 (two) times daily with a meal.   Yes [provider]  clonazePAM  (KLONOPIN ) 1 MG disintegrating tablet Take 1 mg by mouth daily. 03/10/22  Yes [provider]  dapagliflozin  propanediol (FARXIGA ) 10 MG TABS tablet Take 10 mg by mouth in the morning.   Yes [provider]  diphenhydrAMINE  (BENADRYL ) 25 MG tablet Take 1 tablet (25 mg total) by mouth every 6 (six) hours. Patient taking differently: Take  25 mg by mouth every 6 (six) hours as needed for allergies or sleep. 10/29/13  Yes Trish Furl, MD  gabapentin  (NEURONTIN ) 300 MG capsule Take 300 mg by mouth 3 (three) times daily.  01/19/18  Yes [provider]  hydrocortisone  (ANUSOL -HC) 2.5 % rectal cream Place 1 Application rectally daily as needed for hemorrhoids or anal itching. 04/29/23  Yes [provider]  isosorbide -hydrALAZINE  (BIDIL ) 20-37.5 MG tablet Take 1 tablet by mouth in the morning and at bedtime. 04/20/23  Yes [name], [name], DO  lactose free nutrition (BOOST) LIQD Take 237 mLs by mouth daily.   Yes [provider]  lidocaine  (XYLOCAINE ) 2 % solution Patient: Mix 1part 2% viscous lidocaine , 1part H20. Swallow 10mL of diluted mixture, before meals and at bedtime, up to QID 06/08/23  Yes Colie Dawes, MD  NIFEdipine  (PROCARDIA  XL/NIFEDICAL-XL) 90 MG 24 hr tablet Take 90 mg by mouth at bedtime.   Yes [provider]  omeprazole  (PRILOSEC) 20 MG capsule Take 20 mg by mouth daily.  06/04/16  Yes [provider]  polyethylene glycol (MIRALAX  / GLYCOLAX ) 17 g packet Take 17 g by mouth daily. Patient taking differently: Take 17 g by mouth daily as needed. 04/19/22  Yes Cleven Dallas, DO  psyllium (HYDROCIL/METAMUCIL) 95 % PACK Take 1 packet by mouth daily. Patient taking differently: Take 1 packet by mouth daily as needed for mild constipation or moderate constipation. 04/15/22  Yes Cleven Dallas, DO  senna-docusate (SENOKOT-S) 8.6-50 MG tablet Take 1 tablet by mouth 2 (two) times daily. Patient taking differently: Take 1 tablet by mouth daily as needed  for mild constipation or moderate constipation. 04/18/22  Yes Cleven Dallas, DO  spironolactone  (ALDACTONE ) 25 MG tablet Take 1 tablet (25 mg total) by mouth in the morning. 04/20/23  Yes [name], [name], DO  Blood Glucose Monitoring Suppl (ONETOUCH VERIO FLEX SYSTEM) w/Device KIT USE TO CHECK BLOOD SUGAR TWICE DAILY 04/21/19   [provider]   ONETOUCH VERIO test strip USE TO CHECK BLOOD SUGAR TWICE DAILY 07/20/19   [provider]     Vital Signs: BP 118/85 (BP Location: Right Arm)   Pulse 72   Temp 97.9 F (36.6 C) (Oral)   Resp 16   Ht 6\' 2"  (1.88 m)   Wt 253 lb 15.5 oz (115.2 kg)   SpO2 95%   BMI 32.61 kg/m   Physical Exam NAD, alert Abdomen: soft, mild expected tenderness at insertion site.  Dried blood at site, but no fresh blood or oozing noted. T-Tacs in place.   Imaging: IR GASTROSTOMY TUBE MOD SED Result Date: 06/12/2023 INDICATION: 68 year old male with esophageal cancer and dysphagia. EXAM: Fluoroscopically guided placement of percutaneous push-in balloon retention gastrostomy tube Interventional Radiologist:  Roxie Cord, MD MEDICATIONS: 2 g Ancef ; Antibiotics were administered within 1 hour of the procedure. ANESTHESIA/SEDATION: Versed  3 mg IV; Fentanyl  100 mcg IV administered by the radiology nurse. Moderate Sedation Time:  11 minutes The patient's vital signs and level of consciousness were continuously monitored during the procedure by the interventional radiology nurse under my direct supervision. CONTRAST:  20mL OMNIPAQUE  IOHEXOL  300 MG/ML  SOLN FLUOROSCOPY: Radiation exposure index: 58 mGy, air kerma COMPLICATIONS: None immediate. PROCEDURE: Informed written consent was obtained from the patient after a thorough discussion of the procedural risks, benefits and alternatives. All questions were addressed. Maximal Sterile Barrier Technique was utilized including caps, mask, sterile gowns, sterile gloves, sterile drape, hand hygiene and skin antiseptic. A timeout was performed prior to the initiation of the procedure. Maximal barrier sterile technique utilized including caps, mask, sterile gowns, sterile gloves, large sterile drape, hand hygiene, and chlorhexadine skin prep. An angled catheter was advanced over a wire under fluoroscopic guidance through the nose, down the esophagus and into the body  of the stomach. The stomach was then insufflated with several 100 ml of air. Fluoroscopy confirms the location of the gastric bubble. The transverse colon is superior to the stomach and does not displace over the stomach or inferior to the stomach despite large volume inflation. The procedure was paused at this time and additional reference was made to the patient's prior CT scan of the abdomen and pelvis. This demonstrates that the colon does lie high above the stomach. However, the gastric antrum is in its usual position anterior and just deep to the abdominal wall. No evidence of colon, vessels or other obstruction between the anterior abdominal wall and the gastric body/antrum. Given the patient's clinical need for gastrostomy tube, the decision was made to proceed despite this atypical anatomic configuration. Under direct fluoroscopic guidance, a 2 absorbable T tacks were placed, and the anterior gastric wall drawn up against the anterior abdominal wall. Percutaneous access was then obtained into the mid gastric body with an 18 gauge sheath needle. Aspiration of air, and injection of contrast material under fluoroscopy confirmed needle placement. An Amplatz wire was advanced in the gastric body and the access needle exchanged for an assembly of a 10 x 80 mm non compliant athletics balloon placed coaxially through a 20 French balloon retention gastrostomy tube. The balloon was advanced across the trans  abdominal tract. The and carefully inflated to full effacement. At the balloon was deflated, the balloon and G tube assembly was advanced over the wire and into the stomach. The retention balloon was filled with 20 mL saline and pulled back against the anterior abdominal wall. The balloon and wire were removed. Contrast was injected through the gastrostomy tube confirming its location within the stomach. The tube was then flushed with saline. The tube was then secured with the external bumper and capped. The  patient will be observed for several hours with the newly placed tube on low wall suction to evaluate for any post procedure complication. The patient tolerated the procedure well, there is no immediate complication. IMPRESSION: Successful placement of a 20 French push-in balloon retention gastrostomy tube. Electronically Signed   By: Fernando Hoyer M.D.   On: 06/12/2023 16:47    Labs:  CBC: Recent Labs    05/27/23 0649 06/12/23 0505  WBC 9.3 7.9  HGB 14.6 13.5  HCT 41.9 39.2  PLT 330 269    COAGS: Recent Labs    06/12/23 0505  INR 1.2    BMP: Recent Labs    04/27/23 0829 05/27/23 0649 06/12/23 0505  NA 139 138 136  K 4.4 4.0 3.7  CL 100 102 101  CO2 24 25 26   GLUCOSE 90 88 85  BUN 6* 6* 9  CALCIUM  9.7 9.2 8.7*  CREATININE 0.92 0.90 0.58*  GFRNONAA  --  >60 >60    LIVER FUNCTION TESTS: Recent Labs    04/27/23 0829  BILITOT 1.1  AST 15  ALT 10  ALKPHOS 177*  PROT 7.1  ALBUMIN 4.3    Assessment and Plan: Esophageal cancer s/p gastrostomy tube placement 4/25 Patient gastrostomy tube assessed s/p placement yesterday.  Site intact.  Old dried blood, but no fresh oozing.  T-tacs in place.  TF infusing without issue.   IR will follow to ensure T-tac removal in 7-10 days (RN informed this may occur spontaneously which is ok and expected).   Electronically Signed: Joyel Chenette Sue-Ellen Magdeline Prange, PA 06/13/2023, 2:33 PM   I spent a total of 15 Minutes at the the patient's bedside AND on the patient's hospital floor or unit, greater than 50% of which was counseling/coordinating care for esophageal cancer.

## 2023-06-13 NOTE — Progress Notes (Signed)
 PROGRESS NOTE    Melvin Hudson  ZOX:[id number] DOB: 15-Nov-1955 DOA: 06/11/2023 PCP: Lorella Roles, MD   Brief Narrative: Melvin Hudson is a 68 y.o. male with a history of CHF, diabetes mellitus, OSA, primary hypertension, progressive dysphagia, squamous cell carcinoma of the esophagus.  Patient presented secondary to need for PEG tube placement.   Assessment and Plan:  Progressive dysphagia Secondary to esophageal squamous cell carcinoma. Patient admitted for PEG tube placement which was successfully performed on 4/25. Dietitian consulted and tube feeds started. -Continue tube feeds per dietitian recommendations -Check potassium, phosphorus and magnesium daily -Dietitian consultations (4/25) Monitor magnesium, potassium, and phosphorus  for at least 3 days, MD to replete as needed, as pt is at risk for refeeding syndrome. -Recommend 100 mg Thiamine daily x 5 days via IV or tube   Once tube is placed and deemed ready to use: -Initiate Osmolite 1.5 @ 20 ml/hr, advance by 10 ml every 12 hours to goal rate of 70 ml/hr. -60 ml Prosource TF 20 BID -Provides 2680 kcals, 145g protein and 1280 ml H2O -Free water recommendations: 150 ml every 4 hours (900 ml)   Once ready to transition to bolus gravity feeding: -Provide 1 carton (237 ml) Osmolite 1.5 QID -Increase once tolerating to 7.5 cartons total daily of Osmolite 1.5 -Providing 2662 kcals, 111g protein and 1357 ml H2O  GERD -Transition from Protonix  to Pepcid   Primary hypertension -Continue home BiDil    DVT prophylaxis: SCDs Code Status:   Code Status: Full Code Family Communication: None at bedside Disposition Plan: Discharge home likely in 4 days pending toleration of tube feeds   Consultants:  Interventional radiology  Procedures:  None  Antimicrobials: None    Subjective: No issues this morning. Tolerating tube feeds.  Objective: BP 114/81 (BP Location: Left Arm)   Pulse 79   Temp 98.9 F (37.2 C)  (Oral)   Resp 20   Ht 6\' 2"  (1.88 m)   Wt 115.2 kg   SpO2 95%   BMI 32.61 kg/m   Examination:  General exam: Appears calm and comfortable Respiratory system: Clear to auscultation. Respiratory effort normal. Cardiovascular system: S1 & S2 heard, RRR. No murmurs. Gastrointestinal system: Abdomen is mildly distended, soft and non-tender. Normal bowel sounds heard. Central nervous system: Alert and oriented. No focal neurological deficits. Psychiatry: Judgement and insight appear normal. Mood & affect appropriate.    Data Reviewed: I have personally reviewed following labs and imaging studies  CBC Lab Results  Component Value Date   WBC 7.9 06/12/2023   RBC 4.49 06/12/2023   HGB 13.5 06/12/2023   HCT 39.2 06/12/2023   MCV 87.3 06/12/2023   MCH 30.1 06/12/2023   PLT 269 06/12/2023   MCHC 34.4 06/12/2023   RDW 18.6 (H) 06/12/2023   LYMPHSABS 1.3 04/15/2022   MONOABS 1.0 04/15/2022   EOSABS 0.4 04/15/2022   BASOSABS 0.0 04/15/2022     Last metabolic panel Lab Results  Component Value Date   NA 136 06/12/2023   K 3.7 06/12/2023   CL 101 06/12/2023   CO2 26 06/12/2023   BUN 9 06/12/2023   CREATININE 0.58 (L) 06/12/2023   GLUCOSE 85 06/12/2023   GFRNONAA >60 06/12/2023   GFRAA 104 04/10/2020   CALCIUM  8.7 (L) 06/12/2023   PHOS 2.1 (L) 06/13/2023   PROT 7.1 04/27/2023   ALBUMIN 4.3 04/27/2023   LABGLOB 2.8 04/27/2023   AGRATIO 2.0 11/13/2020   BILITOT 1.1 04/27/2023   ALKPHOS 177 (H) 04/27/2023   AST  15 04/27/2023   ALT 10 04/27/2023   ANIONGAP 9 06/12/2023    GFR: Estimated Creatinine Clearance: 117.6 mL/min (A) (by C-G formula based on SCr of 0.58 mg/dL (L)).  No results found for this or any previous visit (from the past 240 hours).    Radiology Studies: IR GASTROSTOMY TUBE MOD SED Result Date: 06/12/2023 INDICATION: 68 year old male with esophageal cancer and dysphagia. EXAM: Fluoroscopically guided placement of percutaneous push-in balloon retention  gastrostomy tube Interventional Radiologist:  Roxie Cord, MD MEDICATIONS: 2 g Ancef ; Antibiotics were administered within 1 hour of the procedure. ANESTHESIA/SEDATION: Versed  3 mg IV; Fentanyl  100 mcg IV administered by the radiology nurse. Moderate Sedation Time:  11 minutes The patient's vital signs and level of consciousness were continuously monitored during the procedure by the interventional radiology nurse under my direct supervision. CONTRAST:  20mL OMNIPAQUE  IOHEXOL  300 MG/ML  SOLN FLUOROSCOPY: Radiation exposure index: 58 mGy, air kerma COMPLICATIONS: None immediate. PROCEDURE: Informed written consent was obtained from the patient after a thorough discussion of the procedural risks, benefits and alternatives. All questions were addressed. Maximal Sterile Barrier Technique was utilized including caps, mask, sterile gowns, sterile gloves, sterile drape, hand hygiene and skin antiseptic. A timeout was performed prior to the initiation of the procedure. Maximal barrier sterile technique utilized including caps, mask, sterile gowns, sterile gloves, large sterile drape, hand hygiene, and chlorhexadine skin prep. An angled catheter was advanced over a wire under fluoroscopic guidance through the nose, down the esophagus and into the body of the stomach. The stomach was then insufflated with several 100 ml of air. Fluoroscopy confirms the location of the gastric bubble. The transverse colon is superior to the stomach and does not displace over the stomach or inferior to the stomach despite large volume inflation. The procedure was paused at this time and additional reference was made to the patient's prior CT scan of the abdomen and pelvis. This demonstrates that the colon does lie high above the stomach. However, the gastric antrum is in its usual position anterior and just deep to the abdominal wall. No evidence of colon, vessels or other obstruction between the anterior abdominal wall and the gastric  body/antrum. Given the patient's clinical need for gastrostomy tube, the decision was made to proceed despite this atypical anatomic configuration. Under direct fluoroscopic guidance, a 2 absorbable T tacks were placed, and the anterior gastric wall drawn up against the anterior abdominal wall. Percutaneous access was then obtained into the mid gastric body with an 18 gauge sheath needle. Aspiration of air, and injection of contrast material under fluoroscopy confirmed needle placement. An Amplatz wire was advanced in the gastric body and the access needle exchanged for an assembly of a 10 x 80 mm non compliant athletics balloon placed coaxially through a 20 French balloon retention gastrostomy tube. The balloon was advanced across the trans abdominal tract. The and carefully inflated to full effacement. At the balloon was deflated, the balloon and G tube assembly was advanced over the wire and into the stomach. The retention balloon was filled with 20 mL saline and pulled back against the anterior abdominal wall. The balloon and wire were removed. Contrast was injected through the gastrostomy tube confirming its location within the stomach. The tube was then flushed with saline. The tube was then secured with the external bumper and capped. The patient will be observed for several hours with the newly placed tube on low wall suction to evaluate for any post procedure complication. The patient tolerated  the procedure well, there is no immediate complication. IMPRESSION: Successful placement of a 20 French push-in balloon retention gastrostomy tube. Electronically Signed   By: Fernando Hoyer M.D.   On: 06/12/2023 16:47      LOS: 2 days    Aneita Keens, MD [facility] 06/13/2023, 11:35 AM   If 7PM-7AM, please contact night-coverage [email or web address]

## 2023-06-13 NOTE — Progress Notes (Signed)
   06/13/23 2137  BiPAP/CPAP/SIPAP  BiPAP/CPAP/SIPAP Pt Type Adult  BiPAP/CPAP/SIPAP Resmed  Mask Type Full face mask  Dentures removed? Not applicable  Mask Size Medium  FiO2 (%) 21 %  Patient Home Machine No  Patient Home Mask No  Patient Home Tubing No  Auto Titrate Yes  Minimum cmH2O 7 cmH2O  Maximum cmH2O 10 cmH2O  Device Plugged into [employer] Power Outlet Yes

## 2023-06-13 NOTE — Progress Notes (Signed)
 Mobility Specialist - Progress Note   06/13/23 1309  Mobility  Activity Ambulated with assistance in hallway  Level of Assistance Modified independent, requires aide device or extra time  Assistive Device Front wheel walker  Distance Ambulated (ft) 500 ft  Activity Response Tolerated well  Mobility Referral Yes  Mobility visit 1 Mobility  Mobility Specialist Start Time (ACUTE ONLY) 1254  Mobility Specialist Stop Time (ACUTE ONLY) 1308  Mobility Specialist Time Calculation (min) (ACUTE ONLY) 14 min   Pt received in bed and agreeable to mobility. No complaints during session. Pt to bed after session with all needs met.    [facility]

## 2023-06-14 DIAGNOSIS — R1319 Other dysphagia: Secondary | ICD-10-CM | POA: Diagnosis not present

## 2023-06-14 DIAGNOSIS — I10 Essential (primary) hypertension: Secondary | ICD-10-CM | POA: Diagnosis not present

## 2023-06-14 LAB — GLUCOSE, CAPILLARY
Glucose-Capillary: 129 mg/dL — ABNORMAL HIGH (ref 70–99)
Glucose-Capillary: 130 mg/dL — ABNORMAL HIGH (ref 70–99)
Glucose-Capillary: 135 mg/dL — ABNORMAL HIGH (ref 70–99)
Glucose-Capillary: 146 mg/dL — ABNORMAL HIGH (ref 70–99)
Glucose-Capillary: 149 mg/dL — ABNORMAL HIGH (ref 70–99)
Glucose-Capillary: 151 mg/dL — ABNORMAL HIGH (ref 70–99)
Glucose-Capillary: 172 mg/dL — ABNORMAL HIGH (ref 70–99)

## 2023-06-14 LAB — MAGNESIUM: Magnesium: 2.2 mg/dL (ref 1.7–2.4)

## 2023-06-14 LAB — POTASSIUM: Potassium: 3.8 mmol/L (ref 3.5–5.1)

## 2023-06-14 LAB — PHOSPHORUS: Phosphorus: 2.8 mg/dL (ref 2.5–4.6)

## 2023-06-14 NOTE — Progress Notes (Signed)
 PROGRESS NOTE    Melvin Hudson  WGN:[id number] DOB: Sep 23, 1955 DOA: 06/11/2023 PCP: Lorella Roles, MD   Brief Narrative: Melvin Hudson is a 68 y.o. male with a history of CHF, diabetes mellitus, OSA, primary hypertension, progressive dysphagia, squamous cell carcinoma of the esophagus.  Patient presented secondary to need for PEG tube placement. PEG tube placed on 4/25.   Assessment and Plan:  Progressive dysphagia Secondary to esophageal squamous cell carcinoma. Patient admitted for PEG tube placement which was successfully performed on 4/25. Dietitian consulted and tube feeds started. -Continue tube feeds per dietitian recommendations -Check potassium, phosphorus and magnesium daily -Dietitian consultations (4/25) Monitor magnesium, potassium, and phosphorus  for at least 3 days, MD to replete as needed, as pt is at risk for refeeding syndrome. -Recommend 100 mg Thiamine daily x 5 days via IV or tube  Once tube is placed and deemed ready to use: -Initiate Osmolite 1.5 @ 20 ml/hr, advance by 10 ml every 12 hours to goal rate of 70 ml/hr. -60 ml Prosource TF 20 BID -Provides 2680 kcals, 145g protein and 1280 ml H2O -Free water recommendations: 150 ml every 4 hours (900 ml)  Once ready to transition to bolus gravity feeding: -Provide 1 carton (237 ml) Osmolite 1.5 QID -Increase once tolerating to 7.5 cartons total daily of Osmolite 1.5 -Providing 2662 kcals, 111g protein and 1357 ml H2O  GERD -Continue Pepcid   Primary hypertension -Continue home BiDil    DVT prophylaxis: SCDs Code Status:   Code Status: Full Code Family Communication: None at bedside Disposition Plan: Discharge home likely in 3-4 days pending toleration of tube feeds   Consultants:  Interventional radiology  Procedures:  None  Antimicrobials: None    Subjective: No issues today.  Objective: BP 109/77 (BP Location: Right Arm)   Pulse 85   Temp 97.7 F (36.5 C) (Oral)   Resp 20   Ht 6'  2" (1.88 m)   Wt 114.5 kg   SpO2 93%   BMI 32.41 kg/m   Examination:  General exam: Appears calm and comfortable    Data Reviewed: I have personally reviewed following labs and imaging studies  CBC Lab Results  Component Value Date   WBC 7.9 06/12/2023   RBC 4.49 06/12/2023   HGB 13.5 06/12/2023   HCT 39.2 06/12/2023   MCV 87.3 06/12/2023   MCH 30.1 06/12/2023   PLT 269 06/12/2023   MCHC 34.4 06/12/2023   RDW 18.6 (H) 06/12/2023   LYMPHSABS 1.3 04/15/2022   MONOABS 1.0 04/15/2022   EOSABS 0.4 04/15/2022   BASOSABS 0.0 04/15/2022     Last metabolic panel Lab Results  Component Value Date   NA 136 06/12/2023   K 3.8 06/14/2023   CL 101 06/12/2023   CO2 26 06/12/2023   BUN 9 06/12/2023   CREATININE 0.58 (L) 06/12/2023   GLUCOSE 85 06/12/2023   GFRNONAA >60 06/12/2023   GFRAA 104 04/10/2020   CALCIUM  8.7 (L) 06/12/2023   PHOS 2.8 06/14/2023   PROT 7.1 04/27/2023   ALBUMIN 4.3 04/27/2023   LABGLOB 2.8 04/27/2023   AGRATIO 2.0 11/13/2020   BILITOT 1.1 04/27/2023   ALKPHOS 177 (H) 04/27/2023   AST 15 04/27/2023   ALT 10 04/27/2023   ANIONGAP 9 06/12/2023    GFR: Estimated Creatinine Clearance: 117.2 mL/min (A) (by C-G formula based on SCr of 0.58 mg/dL (L)).  No results found for this or any previous visit (from the past 240 hours).    Radiology Studies: IR GASTROSTOMY TUBE  MOD SED Result Date: 06/12/2023 INDICATION: 68 year old male with esophageal cancer and dysphagia. EXAM: Fluoroscopically guided placement of percutaneous push-in balloon retention gastrostomy tube Interventional Radiologist:  Roxie Cord, MD MEDICATIONS: 2 g Ancef ; Antibiotics were administered within 1 hour of the procedure. ANESTHESIA/SEDATION: Versed  3 mg IV; Fentanyl  100 mcg IV administered by the radiology nurse. Moderate Sedation Time:  11 minutes The patient's vital signs and level of consciousness were continuously monitored during the procedure by the interventional  radiology nurse under my direct supervision. CONTRAST:  20mL OMNIPAQUE  IOHEXOL  300 MG/ML  SOLN FLUOROSCOPY: Radiation exposure index: 58 mGy, air kerma COMPLICATIONS: None immediate. PROCEDURE: Informed written consent was obtained from the patient after a thorough discussion of the procedural risks, benefits and alternatives. All questions were addressed. Maximal Sterile Barrier Technique was utilized including caps, mask, sterile gowns, sterile gloves, sterile drape, hand hygiene and skin antiseptic. A timeout was performed prior to the initiation of the procedure. Maximal barrier sterile technique utilized including caps, mask, sterile gowns, sterile gloves, large sterile drape, hand hygiene, and chlorhexadine skin prep. An angled catheter was advanced over a wire under fluoroscopic guidance through the nose, down the esophagus and into the body of the stomach. The stomach was then insufflated with several 100 ml of air. Fluoroscopy confirms the location of the gastric bubble. The transverse colon is superior to the stomach and does not displace over the stomach or inferior to the stomach despite large volume inflation. The procedure was paused at this time and additional reference was made to the patient's prior CT scan of the abdomen and pelvis. This demonstrates that the colon does lie high above the stomach. However, the gastric antrum is in its usual position anterior and just deep to the abdominal wall. No evidence of colon, vessels or other obstruction between the anterior abdominal wall and the gastric body/antrum. Given the patient's clinical need for gastrostomy tube, the decision was made to proceed despite this atypical anatomic configuration. Under direct fluoroscopic guidance, a 2 absorbable T tacks were placed, and the anterior gastric wall drawn up against the anterior abdominal wall. Percutaneous access was then obtained into the mid gastric body with an 18 gauge sheath needle. Aspiration of air,  and injection of contrast material under fluoroscopy confirmed needle placement. An Amplatz wire was advanced in the gastric body and the access needle exchanged for an assembly of a 10 x 80 mm non compliant athletics balloon placed coaxially through a 20 French balloon retention gastrostomy tube. The balloon was advanced across the trans abdominal tract. The and carefully inflated to full effacement. At the balloon was deflated, the balloon and G tube assembly was advanced over the wire and into the stomach. The retention balloon was filled with 20 mL saline and pulled back against the anterior abdominal wall. The balloon and wire were removed. Contrast was injected through the gastrostomy tube confirming its location within the stomach. The tube was then flushed with saline. The tube was then secured with the external bumper and capped. The patient will be observed for several hours with the newly placed tube on low wall suction to evaluate for any post procedure complication. The patient tolerated the procedure well, there is no immediate complication. IMPRESSION: Successful placement of a 20 French push-in balloon retention gastrostomy tube. Electronically Signed   By: Fernando Hoyer M.D.   On: 06/12/2023 16:47      LOS: 3 days    Aneita Keens, MD [facility] 06/14/2023, 12:18 PM  If 7PM-7AM, please contact night-coverage [email or web address]

## 2023-06-14 NOTE — Plan of Care (Signed)
  Problem: Education: Goal: Knowledge of General Education information will improve Description: Including pain rating scale, medication(s)/side effects and non-pharmacologic comfort measures Outcome: Progressing   Problem: Coping: Goal: Level of anxiety will decrease Outcome: Progressing   Problem: Elimination: Goal: Will not experience complications related to bowel motility Outcome: Progressing   Problem: Safety: Goal: Ability to remain free from injury will improve Outcome: Progressing   Problem: Skin Integrity: Goal: Risk for impaired skin integrity will decrease Outcome: Progressing   Problem: Skin Integrity: Goal: Risk for impaired skin integrity will decrease Outcome: Progressing

## 2023-06-14 NOTE — Plan of Care (Signed)
  Problem: Education: Goal: Knowledge of General Education information will improve Description: Including pain rating scale, medication(s)/side effects and non-pharmacologic comfort measures Outcome: Progressing   Problem: Health Behavior/Discharge Planning: Goal: Ability to manage health-related needs will improve Outcome: Progressing   Problem: Clinical Measurements: Goal: Ability to maintain clinical measurements within normal limits will improve Outcome: Progressing Goal: Will remain free from infection Outcome: Progressing Goal: Diagnostic test results will improve Outcome: Progressing Goal: Respiratory complications will improve Outcome: Progressing Goal: Cardiovascular complication will be avoided Outcome: Progressing   Problem: Activity: Goal: Risk for activity intolerance will decrease Outcome: Progressing   Problem: Nutrition: Goal: Adequate nutrition will be maintained Outcome: Progressing   Problem: Coping: Goal: Level of anxiety will decrease Outcome: Progressing   Problem: Elimination: Goal: Will not experience complications related to bowel motility Outcome: Progressing Goal: Will not experience complications related to urinary retention Outcome: Progressing   Problem: Pain Managment: Goal: General experience of comfort will improve and/or be controlled Outcome: Progressing   Problem: Safety: Goal: Ability to remain free from injury will improve Outcome: Progressing   Problem: Skin Integrity: Goal: Risk for impaired skin integrity will decrease Outcome: Progressing   Problem: Education: Goal: Ability to describe self-care measures that may prevent or decrease complications (Diabetes Survival Skills Education) will improve Outcome: Progressing   Problem: Fluid Volume: Goal: Ability to maintain a balanced intake and output will improve Outcome: Progressing   Problem: Health Behavior/Discharge Planning: Goal: Ability to identify and utilize  available resources and services will improve Outcome: Progressing Goal: Ability to manage health-related needs will improve Outcome: Progressing   Problem: Metabolic: Goal: Ability to maintain appropriate glucose levels will improve Outcome: Progressing   Problem: Nutritional: Goal: Maintenance of adequate nutrition will improve Outcome: Progressing Goal: Progress toward achieving an optimal weight will improve Outcome: Progressing   Problem: Skin Integrity: Goal: Risk for impaired skin integrity will decrease Outcome: Progressing   Problem: Tissue Perfusion: Goal: Adequacy of tissue perfusion will improve Outcome: Progressing

## 2023-06-14 NOTE — Progress Notes (Signed)
   06/14/23 2228  BiPAP/CPAP/SIPAP  BiPAP/CPAP/SIPAP Pt Type Adult  BiPAP/CPAP/SIPAP Resmed  Mask Type Full face mask  Dentures removed? Not applicable  Mask Size Medium  FiO2 (%) 21 %  Patient Home Machine No  Patient Home Mask No  Patient Home Tubing No  Auto Titrate Yes  Minimum cmH2O 7 cmH2O  Maximum cmH2O 10 cmH2O  Device Plugged into RED Power Outlet Yes

## 2023-06-15 ENCOUNTER — Inpatient Hospital Stay: Admitting: Hematology

## 2023-06-15 ENCOUNTER — Other Ambulatory Visit

## 2023-06-15 ENCOUNTER — Ambulatory Visit: Admitting: Nurse Practitioner

## 2023-06-15 ENCOUNTER — Other Ambulatory Visit: Payer: Self-pay

## 2023-06-15 ENCOUNTER — Inpatient Hospital Stay

## 2023-06-15 DIAGNOSIS — C153 Malignant neoplasm of upper third of esophagus: Secondary | ICD-10-CM

## 2023-06-15 DIAGNOSIS — I10 Essential (primary) hypertension: Secondary | ICD-10-CM | POA: Diagnosis not present

## 2023-06-15 DIAGNOSIS — R1319 Other dysphagia: Secondary | ICD-10-CM | POA: Diagnosis not present

## 2023-06-15 LAB — PHOSPHORUS: Phosphorus: 2.4 mg/dL — ABNORMAL LOW (ref 2.5–4.6)

## 2023-06-15 LAB — GLUCOSE, CAPILLARY
Glucose-Capillary: 171 mg/dL — ABNORMAL HIGH (ref 70–99)
Glucose-Capillary: 165 mg/dL — ABNORMAL HIGH (ref 70–99)
Glucose-Capillary: 130 mg/dL — ABNORMAL HIGH (ref 70–99)
Glucose-Capillary: 92 mg/dL (ref 70–99)
Glucose-Capillary: 129 mg/dL — ABNORMAL HIGH (ref 70–99)

## 2023-06-15 LAB — MAGNESIUM: Magnesium: 2.2 mg/dL (ref 1.7–2.4)

## 2023-06-15 LAB — POTASSIUM: Potassium: 3.7 mmol/L (ref 3.5–5.1)

## 2023-06-15 MED ORDER — K PHOS MONO-SOD PHOS DI & MONO 155-852-130 MG PO TABS
500.0000 mg | ORAL_TABLET | Freq: Three times a day (TID) | ORAL | Status: AC
  Administered 2023-06-15 (×3): 500 mg via ORAL
  Filled 2023-06-15 (×3): qty 2

## 2023-06-15 MED ORDER — OSMOLITE 1.5 CAL PO LIQD
120.0000 mL | Freq: Four times a day (QID) | ORAL | Status: DC
  Administered 2023-06-15 (×2): 120 mL
  Filled 2023-06-15 (×4): qty 237

## 2023-06-15 NOTE — Plan of Care (Signed)
   Problem: Activity: Goal: Risk for activity intolerance will decrease Outcome: Progressing   Problem: Nutrition: Goal: Adequate nutrition will be maintained Outcome: Progressing   Problem: Pain Managment: Goal: General experience of comfort will improve and/or be controlled Outcome: Progressing   Problem: Safety: Goal: Ability to remain free from injury will improve Outcome: Progressing

## 2023-06-15 NOTE — Progress Notes (Signed)
 RT offered to place pt on CPAP for the night and pt declined stating he did not want it and that he does not sleep well with it on. Machine in room and available. Pt instructed to have RN call if he changes his mind. Pt respiratory status stable at this time w/no distress noted.    06/15/23 2301  BiPAP/CPAP/SIPAP  $ Non-Invasive Home Ventilator  Subsequent  $ Face Mask Medium Yes  BiPAP/CPAP/SIPAP Pt Type Adult  BiPAP/CPAP/SIPAP Resmed  Reason BIPAP/CPAP not in use Non-compliant (pt states he does not want to wear it)  Mask Type Full face mask  Dentures removed? Not applicable  Mask Size Medium  FiO2 (%) 21 %  Patient Home Machine No  Patient Home Mask No  Patient Home Tubing No  Auto Titrate Yes  CPAP/SIPAP surface wiped down Yes  Device Plugged into RED Power Outlet Yes  BiPAP/CPAP /SiPAP Vitals  Pulse Rate 94  Resp 19  SpO2 98 %  Bilateral Breath Sounds Clear;Diminished  MEWS Score/Color  MEWS Score 0  MEWS Score Color Marrie Sizer

## 2023-06-15 NOTE — Progress Notes (Signed)
 Nutrition Follow-up  DOCUMENTATION CODES:   Non-severe (moderate) malnutrition in context of chronic illness  INTERVENTION:   -Continue Monitor magnesium, potassium, and phosphorus for at least 3 days, MD to replete as needed, as pt is at risk for refeeding syndrome.  Start transition to bolus feeds this afternoon via PEG: -Administer by gravity bolus 1/2 carton (120 ml) Osmolite 1.5 QID -Free water: 60 ml before and after each feeding -60 ml Prosource TF 20 BID -Advance as tolerated to 7.5 cartons daily -Provides 2822 kcals, 151g protein and 1837 ml H2O -Free water recommendation: 100 ml QID (400 ml)  NUTRITION DIAGNOSIS:   Moderate Malnutrition related to chronic illness, cancer and cancer related treatments, dysphagia as evidenced by mild muscle depletion, energy intake < or equal to 75% for > or equal to 1 month, percent weight loss.  Ongoing.  GOAL:   Patient will meet greater than or equal to 90% of their needs  Progressing with TF  MONITOR:   Labs, TF tolerance  ASSESSMENT:   68 y.o. male with a history of CHF, diabetes mellitus, OSA, primary hypertension, progressive dysphagia, squamous cell carcinoma of the esophagus.  Patient presented secondary to need for PEG tube placement.  Patient in room, states he is feeling fine, pt is at goal for his tube feeding. Pt open to transition to bolus feeds today, reports his sister will be at bedside later and would like to be educated on tube feeds.  Spoke with RN who states pt was advanced to goal rate this morning. Discussed bolus transition this afternoon with MD who agreed this was okay. Will start with 1/2 cartons and advance as tolerated to whole carton feeds.   Admission weight: 248 lbs Current weight: 252 lbs  Medications: Pepcid , K-Phos  Labs reviewed: CBGs: 129-171 Low Phos   Diet Order:   Diet Order             Diet clear liquid Room service appropriate? Yes; Fluid consistency: Thin  Diet effective now                    EDUCATION NEEDS:   Education needs have been addressed  Skin:  Skin Assessment: Reviewed RN Assessment  Last BM:  4/24  Height:   Ht Readings from Last 1 Encounters:  06/12/23 6\' 2"  (1.88 m)    Weight:   Wt Readings from Last 1 Encounters:  06/14/23 114.5 kg    BMI:  Body mass index is 32.41 kg/m.  Estimated Nutritional Needs:   Kcal:  2500-2700  Protein:  130-145g  Fluid:  >2L/day   Arna Better, MS, RD, LDN Inpatient Clinical Dietitian Contact via Secure chat

## 2023-06-15 NOTE — Progress Notes (Signed)
 [facility] Clinical Social Work  Initial Assessment   Melvin Hudson is a 68 y.o. year old male presenting alone. Clinical Social Work was referred by medical provider for assessment of psychosocial needs.   SDOH (Social Determinants of Health) assessments performed: Yes SDOH Interventions    Flowsheet Row ED to Hosp-Admission (Discharged) from [facility]/15/2022 in [facility]  SDOH Interventions   Transportation Interventions Intervention Not Indicated, Inpatient TOC, Patient Resources (Friends/Family)       SDOH Screenings   Food Insecurity: No Food Insecurity (06/11/2023)  Housing: Low Risk  (06/11/2023)  Transportation Needs: Unmet Transportation Needs (06/11/2023)  Utilities: Not At Risk (06/11/2023)  Social Connections: Socially Isolated (06/11/2023)  Tobacco Use: Medium Risk (06/11/2023)     Distress Screen completed: Yes    01/20/2014   11:21 AM  ONCBCN DISTRESS SCREENING  Screening Type Other (comment)  Distress experienced in past week (1-10) 0      Family/Social Information:  Housing Arrangement: patient lives alone Family members/support persons in your life? Pt reports his sister lives upstairs from him.  Pt also has a couple of friends that may be able to provide some limited support.  Pt's sister works through the week.  Pt requesting treatment on Mondays if possible as that would be the easiest day for his sister to assist w/ transportation. Transportation concerns: yes, pt states he will utilize Medicaid transport to get to appointments, but does not wish to use that services to get home as many times he needs to wait a couple of hours for the ride to show up.  Pt would like to arrange appointments on days and times that would be convenient for his sister to pick him up afterwards.    Employment: Retired .  Income source: Actor concerns: Yes, current concerns Type of concern: Utilities Food access concerns: no Religious  or spiritual practice: Yes-[employer] Advanced directives: Not known Services Currently in place:  none  Coping/ Adjustment to diagnosis: Patient understands treatment plan and what happens next? 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: pt's priority is to start treatment w/ the hope of positive results Patient enjoys  not addressed Current coping skills/ strengths: Capable of independent living , Motivation for treatment/growth , and Physical Health     SUMMARY: Current SDOH Barriers:  Financial constraints related to fixed income  Clinical Social Work Clinical Goal(s):  Explore community resource options for unmet needs related to:  Financial Strain   Interventions: Discussed common feeling and emotions when being diagnosed with cancer, and the importance of support during treatment Informed patient of the support team roles and support services at [facility] Provided CSW contact information and encouraged patient to call with any questions or concerns Provided patient with information about the [employer].  Pt does receive food stamps, so he would be eligible.  Pt aware he will need to start treatment before he is eligible to apply.   Medical team informed of pt's request for Monday treatment if possible.   Follow Up Plan: Patient will contact CSW with any support or resource needs Patient verbalizes understanding of plan: Yes    Quenton Bruns, LCSW Clinical Social Worker [facility]

## 2023-06-15 NOTE — Progress Notes (Signed)
 PROGRESS NOTE    Melvin Hudson  ZOX:[id number] DOB: 02-23-1955 DOA: 06/11/2023 PCP: Lorella Roles, MD   Brief Narrative: Melvin Hudson is a 68 y.o. male with a history of CHF, diabetes mellitus, OSA, primary hypertension, progressive dysphagia, squamous cell carcinoma of the esophagus.  Patient presented secondary to need for PEG tube placement. PEG tube placed on 4/25.   Assessment and Plan:  Progressive dysphagia Secondary to esophageal squamous cell carcinoma. Patient admitted for PEG tube placement which was successfully performed on 4/25. Dietitian consulted and tube feeds started. Patient advanced to a rate of 70 mL/hr -Continue tube feeds per dietitian recommendations -Check potassium, phosphorus and magnesium daily -Dietitian consultations (4/25) Monitor magnesium, potassium, and phosphorus  for at least 3 days, MD to replete as needed, as pt is at risk for refeeding syndrome. -Recommend 100 mg Thiamine daily x 5 days via IV or tube  Once tube is placed and deemed ready to use: -Initiate Osmolite 1.5 @ 20 ml/hr, advance by 10 ml every 12 hours to goal rate of 70 ml/hr. -60 ml Prosource TF 20 BID -Provides 2680 kcals, 145g protein and 1280 ml H2O -Free water recommendations: 150 ml every 4 hours (900 ml)  Once ready to transition to bolus gravity feeding: -Provide 1 carton (237 ml) Osmolite 1.5 QID -Increase once tolerating to 7.5 cartons total daily of Osmolite 1.5 -Providing 2662 kcals, 111g protein and 1357 ml H2O -Likely switch to bolus tube feeds in AM  GERD -Continue Pepcid   Primary hypertension -Continue home BiDil    DVT prophylaxis: SCDs Code Status:   Code Status: Full Code Family Communication: None at bedside Disposition Plan: Discharge home likely in 2 days pending toleration of tube feeds   Consultants:  Interventional radiology  Procedures:  None  Antimicrobials: None    Subjective: No concerns this mroning. Tolerating tube  feeds.  Objective: BP 124/82   Pulse 95   Temp 99.1 F (37.3 C) (Oral)   Resp 18   Ht 6\' 2"  (1.88 m)   Wt 114.5 kg   SpO2 94%   BMI 32.41 kg/m   Examination:  General: Well appearing, no distress   Data Reviewed: I have personally reviewed following labs and imaging studies  CBC Lab Results  Component Value Date   WBC 7.9 06/12/2023   RBC 4.49 06/12/2023   HGB 13.5 06/12/2023   HCT 39.2 06/12/2023   MCV 87.3 06/12/2023   MCH 30.1 06/12/2023   PLT 269 06/12/2023   MCHC 34.4 06/12/2023   RDW 18.6 (H) 06/12/2023   LYMPHSABS 1.3 04/15/2022   MONOABS 1.0 04/15/2022   EOSABS 0.4 04/15/2022   BASOSABS 0.0 04/15/2022     Last metabolic panel Lab Results  Component Value Date   NA 136 06/12/2023   K 3.7 06/15/2023   CL 101 06/12/2023   CO2 26 06/12/2023   BUN 9 06/12/2023   CREATININE 0.58 (L) 06/12/2023   GLUCOSE 85 06/12/2023   GFRNONAA >60 06/12/2023   GFRAA 104 04/10/2020   CALCIUM  8.7 (L) 06/12/2023   PHOS 2.4 (L) 06/15/2023   PROT 7.1 04/27/2023   ALBUMIN 4.3 04/27/2023   LABGLOB 2.8 04/27/2023   AGRATIO 2.0 11/13/2020   BILITOT 1.1 04/27/2023   ALKPHOS 177 (H) 04/27/2023   AST 15 04/27/2023   ALT 10 04/27/2023   ANIONGAP 9 06/12/2023    GFR: Estimated Creatinine Clearance: 117.2 mL/min (A) (by C-G formula based on SCr of 0.58 mg/dL (L)).  No results found for this or any  previous visit (from the past 240 hours).    Radiology Studies: No results found.     LOS: 4 days    Aneita Keens, MD [facility] 06/15/2023, 9:52 AM   If 7PM-7AM, please contact night-coverage [email or web address]

## 2023-06-15 NOTE — Progress Notes (Signed)
 Mobility Specialist - Progress Note   06/15/23 1125  Mobility  Activity Ambulated with assistance in hallway  Level of Assistance Modified independent, requires aide device or extra time  Assistive Device Front wheel walker  Distance Ambulated (ft) 500 ft  Range of Motion/Exercises Active  Activity Response Tolerated well  Mobility Referral Yes  Mobility visit 1 Mobility  Mobility Specialist Start Time (ACUTE ONLY) 1115  Mobility Specialist Stop Time (ACUTE ONLY) 1125  Mobility Specialist Time Calculation (min) (ACUTE ONLY) 10 min   Pt was found in bed and agreeable to ambulate. C/o LLE soreness. At EOS returned to sit EOB with all needs met. Call bell in reach.  Lorna Rose Mobility Specialist

## 2023-06-15 NOTE — Progress Notes (Signed)
 Melvin Hudson   DOB:1955-05-24   WJ#:[id number]   AOZ#:[id number]  Medical Oncology f/u   Subjective: Patient was admitted Maryalice Smaller my office last week due to worsening dysphagia, he underwent a G-tube placement by IR after admission, on tube feeds now. Still feels weak, has not been out of bed much.   Objective:  Vitals:   06/15/23 1258 06/15/23 2008  BP: 116/82 125/80  Pulse: 96 88  Resp: 16 18  Temp: 98.4 F (36.9 C) 98.4 F (36.9 C)  SpO2: 96% 97%    Body mass index is 32.41 kg/m. No intake or output data in the 24 hours ending 06/15/23 2202   Sclerae unicteric  Oropharynx clear  MSK no focal spinal tenderness, no peripheral edema, (+) left calf tenderness   Neuro nonfocal   CBG (last 3)  Recent Labs    06/15/23 1128 06/15/23 1607 06/15/23 2009  GLUCAP 130* 92 129*     Labs:  Lab Results  Component Value Date   WBC 7.9 06/12/2023   HGB 13.5 06/12/2023   HCT 39.2 06/12/2023   MCV 87.3 06/12/2023   PLT 269 06/12/2023   NEUTROABS 5.7 04/15/2022     Urine Studies No results for input(s): "UHGB", "CRYS" in the last 72 hours.  Invalid input(s): "UACOL", "UAPR", "USPG", "UPH", "UTP", "UGL", "UKET", "UBIL", "UNIT", "UROB", "ULEU", "UEPI", "UWBC", "URBC", "UBAC", "CAST", "UCOM", "BILUA"  Basic Metabolic Panel: Recent Labs  Lab 06/12/23 0505 06/13/23 0651 06/14/23 0853 06/15/23 0539  NA 136  --   --   --   K 3.7  --  3.8 3.7  CL 101  --   --   --   CO2 26  --   --   --   GLUCOSE 85  --   --   --   BUN 9  --   --   --   CREATININE 0.58*  --   --   --   CALCIUM  8.7*  --   --   --   MG  --  2.3 2.2 2.2  PHOS  --  2.1* 2.8 2.4*   GFR Estimated Creatinine Clearance: 117.2 mL/min (A) (by C-G formula based on SCr of 0.58 mg/dL (L)). Liver Function Tests: No results for input(s): "AST", "ALT", "ALKPHOS", "BILITOT", "PROT", "ALBUMIN" in the last 168 hours. No results for input(s): "LIPASE", "AMYLASE" in the last 168 hours. No results for input(s): "AMMONIA" in  the last 168 hours. Coagulation profile Recent Labs  Lab 06/12/23 0505  INR 1.2    CBC: Recent Labs  Lab 06/12/23 0505  WBC 7.9  HGB 13.5  HCT 39.2  MCV 87.3  PLT 269   Cardiac Enzymes: No results for input(s): "CKTOTAL", "CKMB", "CKMBINDEX", "TROPONINI" in the last 168 hours. BNP: Invalid input(s): "POCBNP" CBG: Recent Labs  Lab 06/15/23 0422 06/15/23 0730 06/15/23 1128 06/15/23 1607 06/15/23 2009  GLUCAP 171* 165* 130* 92 129*   D-Dimer No results for input(s): "DDIMER" in the last 72 hours. Hgb A1c No results for input(s): "HGBA1C" in the last 72 hours. Lipid Profile No results for input(s): "CHOL", "HDL", "LDLCALC", "TRIG", "CHOLHDL", "LDLDIRECT" in the last 72 hours. Thyroid  function studies No results for input(s): "TSH", "T4TOTAL", "T3FREE", "THYROIDAB" in the last 72 hours.  Invalid input(s): "FREET3" Anemia work up No results for input(s): "VITAMINB12", "FOLATE", "FERRITIN", "TIBC", "IRON", "RETICCTPCT" in the last 72 hours. Microbiology No results found for this or any previous visit (from the past 240 hours).    Studies:  No  results found.  Assessment: 68 y.o.  Newly diagnosed proximal esophageal squamous cell carcinoma Dysphagia due to #1, status post PEG, on tube feeds Malnutrition and weight loss History of tonsillary carcinoma Decondition  Plan:  - Will see if he tolerates bolus tube feeds - I encouraged him to participate in physical therapy to improve his performance status - He had a PET scan done last week, but has not been read yet, will call radiology for expedited reading tomorrow. -I will f/u as needed in hospital and plan to start chemo RT after discharge if no distant mets.  Will coordinated his care with radiation oncologist Dr. Dyann Glaser, MD 06/15/2023

## 2023-06-16 ENCOUNTER — Inpatient Hospital Stay (HOSPITAL_COMMUNITY)

## 2023-06-16 DIAGNOSIS — I10 Essential (primary) hypertension: Secondary | ICD-10-CM | POA: Diagnosis not present

## 2023-06-16 DIAGNOSIS — M79605 Pain in left leg: Secondary | ICD-10-CM

## 2023-06-16 DIAGNOSIS — R1319 Other dysphagia: Secondary | ICD-10-CM | POA: Diagnosis not present

## 2023-06-16 DIAGNOSIS — I82409 Acute embolism and thrombosis of unspecified deep veins of unspecified lower extremity: Secondary | ICD-10-CM

## 2023-06-16 HISTORY — DX: Acute embolism and thrombosis of unspecified deep veins of unspecified lower extremity: I82.409

## 2023-06-16 LAB — BASIC METABOLIC PANEL WITH GFR
Anion gap: 12 (ref 5–15)
BUN: 21 mg/dL (ref 8–23)
CO2: 26 mmol/L (ref 22–32)
Calcium: 8.6 mg/dL — ABNORMAL LOW (ref 8.9–10.3)
Chloride: 103 mmol/L (ref 98–111)
Creatinine, Ser: 0.53 mg/dL — ABNORMAL LOW (ref 0.61–1.24)
GFR, Estimated: >60 mL/min (ref >60)
Glucose, Bld: 118 mg/dL — ABNORMAL HIGH (ref 70–99)
Potassium: 3.7 mmol/L (ref 3.5–5.1)
Sodium: 141 mmol/L (ref 135–145)

## 2023-06-16 LAB — GLUCOSE, CAPILLARY
Glucose-Capillary: 102 mg/dL — ABNORMAL HIGH (ref 70–99)
Glucose-Capillary: 122 mg/dL — ABNORMAL HIGH (ref 70–99)
Glucose-Capillary: 123 mg/dL — ABNORMAL HIGH (ref 70–99)
Glucose-Capillary: 139 mg/dL — ABNORMAL HIGH (ref 70–99)
Glucose-Capillary: 172 mg/dL — ABNORMAL HIGH (ref 70–99)
Glucose-Capillary: 172 mg/dL — ABNORMAL HIGH (ref 70–99)
Glucose-Capillary: 177 mg/dL — ABNORMAL HIGH (ref 70–99)

## 2023-06-16 LAB — PHOSPHORUS: Phosphorus: 3.3 mg/dL (ref 2.5–4.6)

## 2023-06-16 LAB — MAGNESIUM: Magnesium: 2.1 mg/dL (ref 1.7–2.4)

## 2023-06-16 MED ORDER — APIXABAN 5 MG PO TABS
10.0000 mg | ORAL_TABLET | Freq: Two times a day (BID) | ORAL | Status: DC
  Administered 2023-06-16 – 2023-06-17 (×2): 10 mg
  Filled 2023-06-16 (×2): qty 2

## 2023-06-16 MED ORDER — APIXABAN 5 MG PO TABS: 5.0000 mg | ORAL_TABLET | Freq: Two times a day (BID) | ORAL | Status: DC

## 2023-06-16 MED ORDER — OSMOLITE 1.5 CAL PO LIQD
237.0000 mL | Freq: Four times a day (QID) | ORAL | Status: DC
  Administered 2023-06-16 (×2): 237 mL
  Filled 2023-06-16 (×4): qty 237

## 2023-06-16 MED ORDER — OSMOLITE 1.5 CAL PO LIQD
237.0000 mL | Freq: Four times a day (QID) | ORAL | Status: DC
  Administered 2023-06-16 – 2023-06-17 (×3): 237 mL
  Filled 2023-06-16 (×3): qty 237

## 2023-06-16 MED ORDER — APIXABAN 5 MG PO TABS
10.0000 mg | ORAL_TABLET | Freq: Two times a day (BID) | ORAL | Status: DC
  Administered 2023-06-16: 10 mg via ORAL
  Filled 2023-06-16: qty 2

## 2023-06-16 NOTE — Plan of Care (Signed)
 Pt tolerating bolus feeds well. Pt does participate in care and feedings. Peg dressing clean dry and intact. Pt reported abdominal pain wit ambulation, medicated as needed per MD orders. Pt agreeable to ambulate as tolerated.  Problem: Education: Goal: Knowledge of General Education information will improve Description: Including pain rating scale, medication(s)/side effects and non-pharmacologic comfort measures Outcome: Progressing   Problem: Activity: Goal: Risk for activity intolerance will decrease Outcome: Progressing   Problem: Nutrition: Goal: Adequate nutrition will be maintained Outcome: Progressing   Problem: Pain Managment: Goal: General experience of comfort will improve and/or be controlled Outcome: Progressing   Problem: Safety: Goal: Ability to remain free from injury will improve Outcome: Progressing

## 2023-06-16 NOTE — Discharge Instructions (Addendum)
 Information on my medicine - ELIQUIS (apixaban)  Why was Eliquis prescribed for you? Eliquis was prescribed to treat blood clots that may have been found in the veins of your legs (deep vein thrombosis) or in your lungs (pulmonary embolism) and to reduce the risk of them occurring again.  What do You need to know about Eliquis ? The starting dose is 10 mg (two 5 mg tablets) taken TWICE daily for the FIRST SEVEN (7) DAYS, then on 5/6  the dose is reduced to ONE 5 mg tablet taken TWICE daily.  Eliquis may be taken with or without food.   Try to take the dose about the same time in the morning and in the evening. If you have difficulty swallowing the tablet whole please discuss with your pharmacist how to take the medication safely.  Take Eliquis exactly as prescribed and DO NOT stop taking Eliquis without talking to the doctor who prescribed the medication.  Stopping may increase your risk of developing a new blood clot.  Refill your prescription before you run out.  After discharge, you should have regular check-up appointments with your healthcare provider that is prescribing your Eliquis.    What do you do if you miss a dose? If a dose of ELIQUIS is not taken at the scheduled time, take it as soon as possible on the same day and twice-daily administration should be resumed. The dose should not be doubled to make up for a missed dose.  Important Safety Information A possible side effect of Eliquis is bleeding. You should call your healthcare provider right away if you experience any of the following: Bleeding from an injury or your nose that does not stop. Unusual colored urine (red or dark brown) or unusual colored stools (red or black). Unusual bruising for unknown reasons. A serious fall or if you hit your head (even if there is no bleeding).  Some medicines may interact with Eliquis and might increase your risk of bleeding or clotting while on Eliquis. To help avoid this,  consult your healthcare provider or pharmacist prior to using any new prescription or non-prescription medications, including herbals, vitamins, non-steroidal anti-inflammatory drugs (NSAIDs) and supplements.  This website has more information on Eliquis (apixaban): http://[email or web address].com/eliquis/home

## 2023-06-16 NOTE — Progress Notes (Addendum)
 PROGRESS NOTE    Melvin Hudson  BJY:[id number] DOB: 1955-06-21 DOA: 06/11/2023 PCP: Lorella Roles, MD   Brief Narrative: Melvin Hudson is a 68 y.o. male with a history of CHF, diabetes mellitus, OSA, primary hypertension, progressive dysphagia, squamous cell carcinoma of the esophagus.  Patient presented secondary to need for PEG tube placement. PEG tube placed on 4/25.   Assessment and Plan:  Progressive dysphagia Secondary to esophageal squamous cell carcinoma. Patient admitted for PEG tube placement which was successfully performed on 4/25. Dietitian consulted and tube feeds started. Patient advanced to a rate of 70 mL/hr and then to bolus feeds -Continue tube feeds per dietitian recommendations -Dietitian consultations (4/28): Continue Monitor magnesium, potassium, and phosphorus for at least 3 days, MD to replete as needed, as pt is at risk for refeeding syndrome. Start transition to bolus feeds this afternoon via PEG: Administer by gravity bolus 1/2 carton (120 ml) Osmolite 1.5 QID Free water: 60 ml before and after each feeding 60 ml Prosource TF 20 BID Advance as tolerated to 7.5 cartons daily Provides 2822 kcals, 151g protein and 1837 ml H2O Free water recommendation: 100 ml QID (400 ml)  GERD -Continue Pepcid   Primary hypertension -Continue home BiDil   Left leg pain Present mostly when ambulating. No significant pain when at rest. Minimal swelling, if any. -Follow-up venous duplex -If venous duplex negative for DVT, will obtain ABIs  Addendum: Venous duplex scan significant for evidence of  an acute DVT involving the left femoral vein, left popliteal vein, left posterior tibial veins, left peroneal veins, and left soleal veins. Patient updated and Eliquis started.   DVT prophylaxis: SCDs Code Status:   Code Status: Full Code Family Communication: None at bedside Disposition Plan: Discharge home likely in 1 day if tolerates bolus tube  feeds   Consultants:  Interventional radiology  Procedures:  None  Antimicrobials: None    Subjective: Some left leg pain when ambulating. Venous duplex scan ordered.  Objective: BP 116/70 (BP Location: Right Arm)   Pulse 84   Temp 97.9 F (36.6 C) (Oral)   Resp 18   Ht 6\' 2"  (1.88 m)   Wt 114.5 kg   SpO2 94%   BMI 32.41 kg/m   Examination:  General exam: Appears calm and comfortable Respiratory system: Clear to auscultation. Respiratory effort normal. Cardiovascular system: S1 & S2 heard, RRR. Gastrointestinal system: Abdomen is nondistended, soft and nontender. Normal bowel sounds heard. Central nervous system: Alert and oriented. No focal neurological deficits. Musculoskeletal: Mild left leg swelling without overt edema. No calf tenderness Psychiatry: Judgement and insight appear normal. Mood & affect appropriate.    Data Reviewed: I have personally reviewed following labs and imaging studies  CBC Lab Results  Component Value Date   WBC 7.9 06/12/2023   RBC 4.49 06/12/2023   HGB 13.5 06/12/2023   HCT 39.2 06/12/2023   MCV 87.3 06/12/2023   MCH 30.1 06/12/2023   PLT 269 06/12/2023   MCHC 34.4 06/12/2023   RDW 18.6 (H) 06/12/2023   LYMPHSABS 1.3 04/15/2022   MONOABS 1.0 04/15/2022   EOSABS 0.4 04/15/2022   BASOSABS 0.0 04/15/2022     Last metabolic panel Lab Results  Component Value Date   NA 141 06/16/2023   K 3.7 06/16/2023   CL 103 06/16/2023   CO2 26 06/16/2023   BUN 21 06/16/2023   CREATININE 0.53 (L) 06/16/2023   GLUCOSE 118 (H) 06/16/2023   GFRNONAA >60 06/16/2023   GFRAA 104 04/10/2020   CALCIUM  8.6 (  L) 06/16/2023   PHOS 3.3 06/16/2023   PROT 7.1 04/27/2023   ALBUMIN 4.3 04/27/2023   LABGLOB 2.8 04/27/2023   AGRATIO 2.0 11/13/2020   BILITOT 1.1 04/27/2023   ALKPHOS 177 (H) 04/27/2023   AST 15 04/27/2023   ALT 10 04/27/2023   ANIONGAP 12 06/16/2023    GFR: Estimated Creatinine Clearance: 117.2 mL/min (A) (by C-G formula  based on SCr of 0.53 mg/dL (L)).  No results found for this or any previous visit (from the past 240 hours).    Radiology Studies: No results found.     LOS: 5 days    Aneita Keens, MD [facility] 06/16/2023, 12:28 PM   If 7PM-7AM, please contact night-coverage [email or web address]

## 2023-06-16 NOTE — Progress Notes (Signed)
   06/16/23 2127  BiPAP/CPAP/SIPAP  BiPAP/CPAP/SIPAP Pt Type Adult  Reason BIPAP/CPAP not in use Non-compliant (Patient refused cpap qhs.  He states he is unable to tolerate our machines.  Patient asked for machine to be removed from room.)

## 2023-06-16 NOTE — Progress Notes (Signed)
*  PRELIMINARY RESULTS* Vascular Ultrasound Left lower venous duplex has been completed.  Preliminary findings: See preliminary report under the CV tab.  Palmer Bobo 06/16/2023, 2:11 PM

## 2023-06-17 ENCOUNTER — Other Ambulatory Visit (HOSPITAL_COMMUNITY): Payer: Self-pay

## 2023-06-17 ENCOUNTER — Other Ambulatory Visit: Payer: Self-pay | Admitting: Hematology

## 2023-06-17 ENCOUNTER — Telehealth (HOSPITAL_COMMUNITY): Payer: Self-pay | Admitting: Pharmacy Technician

## 2023-06-17 DIAGNOSIS — R1319 Other dysphagia: Secondary | ICD-10-CM | POA: Diagnosis not present

## 2023-06-17 DIAGNOSIS — C153 Malignant neoplasm of upper third of esophagus: Secondary | ICD-10-CM

## 2023-06-17 LAB — GLUCOSE, CAPILLARY
Glucose-Capillary: 91 mg/dL (ref 70–99)
Glucose-Capillary: 252 mg/dL — ABNORMAL HIGH (ref 70–99)
Glucose-Capillary: 129 mg/dL — ABNORMAL HIGH (ref 70–99)

## 2023-06-17 MED ORDER — ONDANSETRON HCL 8 MG PO TABS
8.0000 mg | ORAL_TABLET | Freq: Three times a day (TID) | ORAL | 1 refills | Status: DC | PRN
Start: 2023-06-17 — End: 2023-11-12

## 2023-06-17 MED ORDER — ORAL CARE MOUTH RINSE
15.0000 mL | OROMUCOSAL | Status: DC
Start: 2023-06-17 — End: 2023-06-17
  Administered 2023-06-17 (×2): 15 mL via OROMUCOSAL

## 2023-06-17 MED ORDER — OXYCODONE HCL 5 MG/5ML PO SOLN
5.0000 mg | ORAL | 0 refills | Status: AC | PRN
  Filled 2023-06-17: qty 100, 4d supply, fill #0

## 2023-06-17 MED ORDER — PROSOURCE TF20 ENFIT COMPATIBL EN LIQD: 60.0000 mL | Freq: Two times a day (BID) | ENTERAL | Status: DC

## 2023-06-17 MED ORDER — ORAL CARE MOUTH RINSE: 15.0000 mL | OROMUCOSAL | Status: DC | PRN

## 2023-06-17 MED ORDER — FREE WATER: 100.0000 mL | Freq: Four times a day (QID) | Status: AC

## 2023-06-17 MED ORDER — OSMOLITE 1.5 CAL PO LIQD
237.0000 mL | Freq: Every day | ORAL | Status: DC
  Administered 2023-06-17 (×2): 237 mL
  Filled 2023-06-17 (×4): qty 237

## 2023-06-17 MED ORDER — PROCHLORPERAZINE MALEATE 10 MG PO TABS: 10.0000 mg | ORAL_TABLET | Freq: Four times a day (QID) | ORAL | 1 refills | Status: DC | PRN

## 2023-06-17 MED ORDER — APIXABAN (ELIQUIS) VTE STARTER PACK (10MG AND 5MG)
ORAL_TABLET | ORAL | 0 refills | Status: DC
  Filled 2023-06-17: qty 74, 30d supply, fill #0

## 2023-06-17 MED ORDER — FAMOTIDINE 40 MG/5ML PO SUSR
20.0000 mg | Freq: Every day | ORAL | 0 refills | Status: DC
  Filled 2023-06-17: qty 50, 20d supply, fill #0

## 2023-06-17 MED ORDER — FREE WATER
100.0000 mL | Freq: Four times a day (QID) | Status: DC
  Administered 2023-06-17: 100 mL

## 2023-06-17 NOTE — Progress Notes (Signed)
 Nutrition Follow-up  DOCUMENTATION CODES:   Non-severe (moderate) malnutrition in context of chronic illness  INTERVENTION:   Continue gravity bolus regimen via PEG: -Administer 1 carton (237 ml) Osmolite 1.5 five times daily -Free water: 60 ml before and after each feeding -60 ml Prosource TF 20 BID -Advance as tolerated to 7.5 cartons daily -Provides 2822 kcals, 151g protein and 1837 ml H2O -Free water recommendation: 100 ml QID (400 ml)  NUTRITION DIAGNOSIS:   Moderate Malnutrition related to chronic illness, cancer and cancer related treatments, dysphagia as evidenced by mild muscle depletion, energy intake < or equal to 75% for > or equal to 1 month, percent weight loss.  Ongoing.  GOAL:   Patient will meet greater than or equal to 90% of their needs  Progressing with TF  MONITOR:   Labs, TF tolerance  ASSESSMENT:   68 y.o. male with a history of CHF, diabetes mellitus, OSA, primary hypertension, progressive dysphagia, squamous cell carcinoma of the esophagus.  Patient presented secondary to need for PEG tube placement.  4/24: admitted 4/25: PEG placed  Advanced tube feedings to  1 carton Osmolite 1.5 five times daily. Pt tolerating 4 cartons yesterday. Will continue advancement daily to goal of 7.5 cartons Osmolite 1.5   Admission weight: 248 lbs Current weight: 252 lbs  Medications: Pepcid   Labs reviewed: CBGs: 91-252   Diet Order:   Diet Order             Diet clear liquid Room service appropriate? Yes; Fluid consistency: Thin  Diet effective now                   EDUCATION NEEDS:   Education needs have been addressed  Skin:  Skin Assessment: Reviewed RN Assessment  Last BM:  4/29  Height:   Ht Readings from Last 1 Encounters:  06/12/23 6\' 2"  (1.88 m)    Weight:   Wt Readings from Last 1 Encounters:  06/14/23 114.5 kg    BMI:  Body mass index is 32.41 kg/m.  Estimated Nutritional Needs:   Kcal:  2500-2700  Protein:   130-145g  Fluid:  >2L/day   Arna Better, MS, RD, LDN Inpatient Clinical Dietitian Contact via Secure chat

## 2023-06-17 NOTE — Discharge Summary (Signed)
 Physician Discharge Summary  Melvin Hudson HQI:[id number] DOB: 08/07/1955 DOA: 06/11/2023  PCP: Lorella Roles, MD  Admit date: 06/11/2023 Discharge date: 06/17/2023  Admitted From: Home Discharge disposition: Home with home health RN  Recommendations at discharge:  Use tube feeding as advised Cautious use of pain medicines The number of your blood pressure medicines have been reduced.  Currently on heart rate and blood pressure control only on BiDil  twice daily   Brief narrative: Melvin Hudson is a 68 y.o. 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  Patient recently had EGD and was diagnosed with squamous cell carcinoma of the esophagus. She was followed up 4/24, was seen by oncology Directly admitted with a plan of PEG tube placement. Admitted to [facility] Underwent PEG tube placement on 06/12/23  Subjective: Patient was seen and examined this afternoon. Chart reviewed. Afebrile, hemodynamically stable, breathing on room air Most recent labs from 4/20 elevated CBC with unremarkable BMP  Hospital course: Progressive dysphagia Moderate malnutrition Dysphagia and malnutrition secondary to esophageal squamous cell carcinoma.  S/p PEG tube placement 4/25 Currently on tube feeding, supplements and free water at the recommendation of dietitian. Discharged with recommendations   GERD Continue Pepcid    Essential hypertension PTA meds- BiDil , Coreg , Farxiga , nifedipine , Aldactone  Currently heart rate and blood pressure are controlled only on BiDil  twice daily.  Other cardiac meds have been hold for last 1 week.  I will continue to hold them at discharge.   Acute left lower extremity DVT  4/29, patient complained of left leg pain on ambulation.   Ultrasound duplex showed an acute DVT involving the left femoral vein, left popliteal vein, left posterior tibial veins,  left peroneal veins, and left soleal veins.  Eliquis has been started.  Goals of care   Code Status: Full Code   Diet:  Diet Order             Diet general           Diet clear liquid Room service appropriate? Yes; Fluid consistency: Thin  Diet effective now                   Nutritional status:  Body mass index is 32.41 kg/m.  Nutrition Problem: Moderate Malnutrition Etiology: chronic illness, cancer and cancer related treatments, dysphagia Signs/Symptoms: mild muscle depletion, energy intake < or equal to 75% for > or equal to 1 month, percent weight loss  Wounds:  -    Discharge Exam:   Vitals:   06/16/23 0449 06/16/23 1233 06/16/23 1948 06/17/23 0357  BP: 116/70 118/83 129/85 (!) 128/91  Pulse: 84 96 82 77  Resp: 18 20 18 18   Temp: 97.9 F (36.6 C) 98.3 F (36.8 C) 98.7 F (37.1 C) 98 F (36.7 C)  TempSrc: Oral Oral Oral Oral  SpO2: 94% 94% 96% 97%  Weight:      Height:        Body mass index is 32.41 kg/m.  General exam: Pleasant, elderly African-American male Skin: No rashes, lesions or ulcers. HEENT: Atraumatic, normocephalic, no obvious bleeding Lungs: Clear to auscultation bilaterally,  CVS: S1, S2, no murmur,   GI/Abd: Soft, nontender, nondistended, bowel sound present CNS: Alert, awake, oriented x 3 Psychiatry: Mood appropriate,  Extremities: No pedal edema, no calf tenderness,   Follow ups:    Follow-up Information     Paliwal, Himanshu, MD Follow up.   Specialty:  Family Medicine Contact information: [address] [place] [address] [phone]                 Discharge Instructions:   Discharge Instructions     Call MD for:  difficulty breathing, headache or visual disturbances   Complete by: As directed    Call MD for:  extreme fatigue   Complete by: As directed    Call MD for:  hives   Complete by: As directed    Call MD for:  persistant dizziness or light-headedness   Complete by: As directed    Call  MD for:  persistant nausea and vomiting   Complete by: As directed    Call MD for:  severe uncontrolled pain   Complete by: As directed    Call MD for:  temperature >100.4   Complete by: As directed    Diet general   Complete by: As directed    Discharge instructions   Complete by: As directed    Recommendations at discharge:   Use tube feeding as advised  Cautious use of pain medicines  The number of your blood pressure medicines have been reduced.  Currently on heart rate and blood pressure control only on BiDil  twice daily  PDMP reviewed this encounter.   Opioid taper instructions: It is important to wean off of your opioid medication as soon as possible. If you do not need pain medication after your surgery it is ok to stop day one. Opioids include: Codeine, Hydrocodone (Norco, Vicodin), Oxycodone (Percocet, oxycontin ) and hydromorphone  amongst others.  Long term and even short term use of opiods can cause: Increased pain response Dependence Constipation Depression Respiratory depression And more.  Withdrawal symptoms can include Flu like symptoms Nausea, vomiting And more Techniques to manage these symptoms Hydrate well Eat regular healthy meals Stay active Use relaxation techniques(deep breathing, meditating, yoga) Do Not substitute Alcohol  to help with tapering If you have been on opioids for less than two weeks and do not have pain than it is ok to stop all together.  Plan to wean off of opioids This plan should start within one week post op of your joint replacement. Maintain the same interval or time between taking each dose and first decrease the dose.  Cut the total daily intake of opioids by one tablet each day Next start to increase the time between doses. The last dose that should be eliminated is the evening dose.        General discharge instructions: Follow with Primary MD Lorella Roles, MD in 7 days  Please request your PCP  to go over your  hospital tests, procedures, radiology results at the follow up. Please get your medicines reviewed and adjusted.  Your PCP may decide to repeat certain labs or tests as needed. Do not drive, operate heavy machinery, perform activities at heights, swimming or participation in water activities or provide baby sitting services if your were admitted for syncope or siezures until you have seen by Primary MD or a Neurologist and advised to do so again. [place]  Controlled Substance Reporting System database was reviewed. Do not drive, operate heavy machinery, perform activities at heights, swim, participate in water activities or provide baby-sitting services while on medications for pain, sleep and mood until your outpatient physician has reevaluated you and advised to do so again.  You are strongly recommended to comply with the dose, frequency and duration of prescribed medications. Activity: As tolerated with Full fall precautions use walker/cane & assistance as  needed Avoid using any recreational substances like cigarette, tobacco, alcohol , or non-prescribed drug. If you experience worsening of your admission symptoms, develop shortness of breath, life threatening emergency, suicidal or homicidal thoughts you must seek medical attention immediately by calling 911 or calling your MD immediately  if symptoms less severe. You must read complete instructions/literature along with all the possible adverse reactions/side effects for all the medicines you take and that have been prescribed to you. Take any new medicine only after you have completely understood and accepted all the possible adverse reactions/side effects.  Wear Seat belts while driving. You were cared for by a hospitalist during your hospital stay. If you have any questions about your discharge medications or the care you received while you were in the hospital after you are discharged, you can call the unit and ask to speak with the  hospitalist or the covering physician. Once you are discharged, your primary care physician will handle any further medical issues. Please note that NO REFILLS for any discharge medications will be authorized once you are discharged, as it is imperative that you return to your primary care physician (or establish a relationship with a primary care physician if you do not have one).   Increase activity slowly   Complete by: As directed        Discharge Medications:   Allergies as of 06/17/2023       Reactions   Azilsartan Itching   Avoid ARB and ACEI per MD   Advil [ibuprofen]    Feels "jittery"        Medication List     STOP taking these medications    carvedilol  25 MG tablet Commonly known as: COREG    Farxiga  10 MG Tabs tablet Generic drug: dapagliflozin  propanediol   NIFEdipine  90 MG 24 hr tablet Commonly known as: PROCARDIA  XL/NIFEDICAL-XL   spironolactone  25 MG tablet Commonly known as: ALDACTONE        TAKE these medications    albuterol  108 (90 Base) MCG/ACT inhaler Commonly known as: VENTOLIN  HFA Inhale 2 puffs into the lungs every 4 (four) hours as needed for wheezing (Cough).   allopurinol  300 MG tablet Commonly known as: ZYLOPRIM  Take 300 mg by mouth every other day.   Apixaban Starter Pack (10mg  and 5mg ) Commonly known as: ELIQUIS STARTER PACK Take as directed on package: start with two-5mg  tablets twice daily for 7 days. On day 8, switch to one-5mg  tablet twice daily.   atorvastatin  40 MG tablet Commonly known as: LIPITOR Take 1 tablet (40 mg total) by mouth at bedtime. What changed: when to take this   bismuth subsalicylate 262 MG/15ML suspension Commonly known as: PEPTO BISMOL Take 30 mLs by mouth every 6 (six) hours as needed for indigestion or diarrhea or loose stools.   calcium  carbonate 500 MG chewable tablet Commonly known as: TUMS - dosed in mg elemental calcium  Chew 2 tablets (400 mg of elemental calcium  total) by mouth 3 (three)  times daily with meals. What changed:  when to take this reasons to take this   clonazePAM  1 MG disintegrating tablet Commonly known as: KLONOPIN  Take 1 mg by mouth daily.   diphenhydrAMINE  25 MG tablet Commonly known as: BENADRYL  Take 1 tablet (25 mg total) by mouth every 6 (six) hours. What changed:  when to take this reasons to take this   famotidine  40 MG/5ML suspension Commonly known as: PEPCID  Place 2.5 mLs (20 mg total) into feeding tube daily. Start taking on: Jun 18, 2023  feeding supplement (PROSource TF20) liquid Place 60 mLs into feeding tube 2 (two) times daily.   free water Soln Place 100 mLs into feeding tube every 6 (six) hours.   gabapentin  300 MG capsule Commonly known as: NEURONTIN  Take 300 mg by mouth 3 (three) times daily.   hydrocortisone  2.5 % rectal cream Commonly known as: ANUSOL -HC Place 1 Application rectally daily as needed for hemorrhoids or anal itching.   isosorbide -hydrALAZINE  20-37.5 MG tablet Commonly known as: BiDil  Take 1 tablet by mouth in the morning and at bedtime.   lactose free nutrition Liqd Take 237 mLs by mouth daily.   lidocaine  2 % solution Commonly known as: XYLOCAINE  Patient: Mix 1part 2% viscous lidocaine , 1part H20. Swallow 10mL of diluted mixture, before meals and at bedtime, up to QID   omeprazole  20 MG capsule Commonly known as: PRILOSEC Take 20 mg by mouth daily.   OneTouch Verio Flex System w/Device Kit USE TO CHECK BLOOD SUGAR TWICE DAILY   OneTouch Verio test strip Generic drug: glucose blood USE TO CHECK BLOOD SUGAR TWICE DAILY   oxyCODONE  5 MG/5ML solution Commonly known as: ROXICODONE  Place 5 mLs (5 mg total) into feeding tube every 4 (four) hours as needed for up to 3 days for severe pain (pain score 7-10).   polyethylene glycol 17 g packet Commonly known as: MIRALAX  / GLYCOLAX  Take 17 g by mouth daily. What changed:  when to take this reasons to take this   psyllium 95 %  Pack Commonly known as: HYDROCIL/METAMUCIL Take 1 packet by mouth daily. What changed:  when to take this reasons to take this   senna-docusate 8.6-50 MG tablet Commonly known as: Senokot-S Take 1 tablet by mouth 2 (two) times daily. What changed:  when to take this reasons to take this         The results of significant diagnostics from this hospitalization (including imaging, microbiology, ancillary and laboratory) are listed below for reference.    Procedures and Diagnostic Studies:   NM PET Image Restag (PS) Skull Base To Thigh Result Date: 06/16/2023 CLINICAL DATA:  Initial treatment strategy for proximal esophageal squamous cell carcinoma. History of tonsillar carcinoma. EXAM: NUCLEAR MEDICINE PET SKULL BASE TO THIGH TECHNIQUE: 11.6 mCi F-18 FDG was injected intravenously. Full-ring PET imaging was performed from the skull base to thigh after the radiotracer. CT data was obtained and used for attenuation correction and anatomic localization. Fasting blood glucose: 113 mg/dl COMPARISON:  PET-CT 78/29/5621 chest CT 04/28/2023 and esophagram 03/05/2023 FINDINGS: Mediastinal blood pool activity: SUV max 3.1 Liver activity: SUV max NA NECK: Proximal esophageal mass discussed in the chest section below. Otherwise unremarkable. Incidental CT findings: None. CHEST: Hypermetabolic circumferential esophageal mass involves the cervical esophagus and upper thoracic esophagus, extending over a 7.6 cm vertical excursion, maximum SUV 16.2, compatible with malignancy. Upper left paratracheal lymph node 0.8 cm in short axis on image 58 series 4 with maximum SUV 4.8. Incidental CT findings: Coronary, aortic arch, and branch vessel atherosclerotic vascular disease. ABDOMEN/PELVIS: No significant abnormal hypermetabolic activity in this region. Incidental CT findings: Atherosclerosis is present, including aortoiliac atherosclerotic disease. Sigmoid colon diverticulosis. SKELETON: No significant abnormal  hypermetabolic activity in this region. 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 IMPRESSION: 1. Hypermetabolic circumferential esophageal mass involving the cervical esophagus and upper thoracic esophagus, extending over a 7.6 cm vertical excursion, maximum SUV 16.2, compatible with malignancy. 2. Upper left paratracheal lymph node in close proximity to the esophageal mass measures 0.8  cm in short axis with maximum SUV 4.8, suspicious for metastatic disease. 3. No findings of distant metastatic disease. 4. Coronary, aortic arch, and branch vessel atherosclerotic vascular disease. Aortic Atherosclerosis (ICD10-I70.0). 5. Sigmoid colon diverticulosis. 6. 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 Electronically Signed   By: Freida Jes M.D.   On: 06/16/2023 12:28   IR GASTROSTOMY TUBE MOD SED Result Date: 06/12/2023 INDICATION: 68 year old male with esophageal cancer and dysphagia. EXAM: Fluoroscopically guided placement of percutaneous push-in balloon retention gastrostomy tube Interventional Radiologist:  Roxie Cord, MD MEDICATIONS: 2 g Ancef ; Antibiotics were administered within 1 hour of the procedure. ANESTHESIA/SEDATION: Versed  3 mg IV; Fentanyl  100 mcg IV administered by the radiology nurse. Moderate Sedation Time:  11 minutes The patient's vital signs and level of consciousness were continuously monitored during the procedure by the interventional radiology nurse under my direct supervision. CONTRAST:  20mL OMNIPAQUE  IOHEXOL  300 MG/ML  SOLN FLUOROSCOPY: Radiation exposure index: 58 mGy, air kerma COMPLICATIONS: None immediate. PROCEDURE: Informed written consent was obtained from the patient after a thorough discussion of the procedural risks, benefits and alternatives. All questions were addressed. Maximal Sterile Barrier Technique was utilized including caps, mask, sterile gowns, sterile gloves, sterile drape, hand hygiene and skin antiseptic.  A timeout was performed prior to the initiation of the procedure. Maximal barrier sterile technique utilized including caps, mask, sterile gowns, sterile gloves, large sterile drape, hand hygiene, and chlorhexadine skin prep. An angled catheter was advanced over a wire under fluoroscopic guidance through the nose, down the esophagus and into the body of the stomach. The stomach was then insufflated with several 100 ml of air. Fluoroscopy confirms the location of the gastric bubble. The transverse colon is superior to the stomach and does not displace over the stomach or inferior to the stomach despite large volume inflation. The procedure was paused at this time and additional reference was made to the patient's prior CT scan of the abdomen and pelvis. This demonstrates that the colon does lie high above the stomach. However, the gastric antrum is in its usual position anterior and just deep to the abdominal wall. No evidence of colon, vessels or other obstruction between the anterior abdominal wall and the gastric body/antrum. Given the patient's clinical need for gastrostomy tube, the decision was made to proceed despite this atypical anatomic configuration. Under direct fluoroscopic guidance, a 2 absorbable T tacks were placed, and the anterior gastric wall drawn up against the anterior abdominal wall. Percutaneous access was then obtained into the mid gastric body with an 18 gauge sheath needle. Aspiration of air, and injection of contrast material under fluoroscopy confirmed needle placement. An Amplatz wire was advanced in the gastric body and the access needle exchanged for an assembly of a 10 x 80 mm non compliant athletics balloon placed coaxially through a 20 French balloon retention gastrostomy tube. The balloon was advanced across the trans abdominal tract. The and carefully inflated to full effacement. At the balloon was deflated, the balloon and G tube assembly was advanced over the wire and into the  stomach. The retention balloon was filled with 20 mL saline and pulled back against the anterior abdominal wall. The balloon and wire were removed. Contrast was injected through the gastrostomy tube confirming its location within the stomach. The tube was then flushed with saline. The tube was then secured with the external bumper and capped. The patient will be observed for several hours with the newly placed tube on low wall suction to evaluate  for any post procedure complication. The patient tolerated the procedure well, there is no immediate complication. IMPRESSION: Successful placement of a 20 French push-in balloon retention gastrostomy tube. Electronically Signed   By: Fernando Hoyer M.D.   On: 06/12/2023 16:47     Labs:   Basic Metabolic Panel: Recent Labs  Lab 06/12/23 0505 06/13/23 0651 06/14/23 0853 06/15/23 0539 06/16/23 0536  NA 136  --   --   --  141  K 3.7  --  3.8 3.7 3.7  CL 101  --   --   --  103  CO2 26  --   --   --  26  GLUCOSE 85  --   --   --  118*  BUN 9  --   --   --  21  CREATININE 0.58*  --   --   --  0.53*  CALCIUM  8.7*  --   --   --  8.6*  MG  --  2.3 2.2 2.2 2.1  PHOS  --  2.1* 2.8 2.4* 3.3   GFR Estimated Creatinine Clearance: 117.2 mL/min (A) (by C-G formula based on SCr of 0.53 mg/dL (L)). Liver Function Tests: No results for input(s): "AST", "ALT", "ALKPHOS", "BILITOT", "PROT", "ALBUMIN" in the last 168 hours. No results for input(s): "LIPASE", "AMYLASE" in the last 168 hours. No results for input(s): "AMMONIA" in the last 168 hours. Coagulation profile Recent Labs  Lab 06/12/23 0505  INR 1.2    CBC: Recent Labs  Lab 06/12/23 0505  WBC 7.9  HGB 13.5  HCT 39.2  MCV 87.3  PLT 269   Cardiac Enzymes: No results for input(s): "CKTOTAL", "CKMB", "CKMBINDEX", "TROPONINI" in the last 168 hours. BNP: Invalid input(s): "POCBNP" CBG: Recent Labs  Lab 06/16/23 1951 06/16/23 2353 06/17/23 0359 06/17/23 0720 06/17/23 1124  GLUCAP  172* 122* 91 252* 129*   D-Dimer No results for input(s): "DDIMER" in the last 72 hours. Hgb A1c No results for input(s): "HGBA1C" in the last 72 hours. Lipid Profile No results for input(s): "CHOL", "HDL", "LDLCALC", "TRIG", "CHOLHDL", "LDLDIRECT" in the last 72 hours. Thyroid  function studies No results for input(s): "TSH", "T4TOTAL", "T3FREE", "THYROIDAB" in the last 72 hours.  Invalid input(s): "FREET3" Anemia work up No results for input(s): "VITAMINB12", "FOLATE", "FERRITIN", "TIBC", "IRON", "RETICCTPCT" in the last 72 hours. Microbiology No results found for this or any previous visit (from the past 240 hours).  Time coordinating discharge: 45 minutes  Signed: Jairon Ripberger  [facility] 06/17/2023, 2:07 PM

## 2023-06-17 NOTE — Progress Notes (Signed)
 Mobility Specialist - Progress Note   06/17/23 1247  Mobility  Activity Ambulated with assistance in hallway  Level of Assistance Modified independent, requires aide device or extra time  Assistive Device Front wheel walker  Distance Ambulated (ft) 500 ft  Activity Response Tolerated well  Mobility Referral Yes  Mobility visit 1 Mobility  Mobility Specialist Stop Time (ACUTE ONLY) 1248   Pt received in room standing and agreeable to mobility. No complaints during session. Pt to EOB after session with all needs met.    [facility]

## 2023-06-17 NOTE — Progress Notes (Signed)
 START ON PATHWAY REGIMEN - Gastroesophageal     A cycle is every 7 days, concurrent with RT:     Paclitaxel      Carboplatin   **Always confirm dose/schedule in your pharmacy ordering system**  Patient Characteristics: Esophageal & GE Junction, Squamous Cell, Preoperative or Nonsurgical Candidate, M0 (Clinical Staging), cT2 or Higher or cN+, Unresectable/Nonsurgical Candidate (Any cT) Therapeutic Status: Preoperative or Nonsurgical Candidate, M0 (Clinical Staging) Histology: Squamous Cell Disease Classification: Esophageal AJCC M Category: cM0 AJCC 8 Stage Grouping: Unknown AJCC Grade: G3 AJCC Location: Upper AJCC T Category: cTX AJCC N Category: cN1 Intent of Therapy: Curative Intent, Discussed with Patient

## 2023-06-17 NOTE — Progress Notes (Addendum)
 Melvin Hudson

## 2023-06-17 NOTE — TOC Transition Note (Signed)
 Transition of Care [facility]) - Discharge Note   Patient Details  Name: Panav Berst MRN: [id number] Date of Birth: 1955-07-08  [facility] CM/SW Contact:  Loreda Rodriguez, RN Phone [phone]  06/17/2023, 3:18 PM   Clinical Narrative:    HH set up with [facility]. Pam with [employer] to complete education. Delivery of tube feeds scheduled. Patient and bedside nurse have been updated. No further needs noted at this time.          Patient Goals and CMS Choice            Discharge Placement                       Discharge Plan and Services Additional resources added to the After Visit Summary for                                       Social Drivers of Health (SDOH) Interventions SDOH Screenings   Food Insecurity: No Food Insecurity (06/11/2023)  Housing: Low Risk  (06/11/2023)  Transportation Needs: Unmet Transportation Needs (06/11/2023)  Utilities: Not At Risk (06/11/2023)  Financial Resource Strain: High Risk (06/15/2023)  Social Connections: Socially Isolated (06/11/2023)  Tobacco Use: Medium Risk (06/11/2023)     Readmission Risk Interventions     No data to display

## 2023-06-17 NOTE — Telephone Encounter (Signed)

## 2023-06-17 NOTE — TOC Progression Note (Addendum)
  Transition of Care [facility]) - Progression Note    Patient Details  Name: Baldwin Kristiansen MRN: [id number] Date of Birth: 08/17/1955  Transition of Care [facility]) CM/SW Contact  Loreda Rodriguez, RN Phone Number:[phone]  06/17/2023, 12:46 PM  Clinical Narrative:    Referral has been sent to [facility] with [facility] for home tube feeds and supplies.   Patient with d/c orders. CM has informed MD that we are still in process of getting tube feeds set up and patient may not be ready. CM has followed up with Pam ([employer]) to determine is patient is ready from tube feed education and supply standpoint. Cm will update team when everything is in order. Patient currently is not ready for discharge.   1437 CM spoke with Pam with [employer] who states that patient will be good to discharge home later today. Pam will come to unit later today for patient education and first delivery is schedules for later today.  1515 CM at bedside to offer choice for home health RN. Patient has no preference. HH referral has been accepted by [place] with [facility]. AVS updated.         Expected Discharge Plan and Services                                               Social Determinants of Health (SDOH) Interventions SDOH Screenings   Food Insecurity: No Food Insecurity (06/11/2023)  Housing: Low Risk  (06/11/2023)  Transportation Needs: Unmet Transportation Needs (06/11/2023)  Utilities: Not At Risk (06/11/2023)  Financial Resource Strain: High Risk (06/15/2023)  Social Connections: Socially Isolated (06/11/2023)  Tobacco Use: Medium Risk (06/11/2023)    Readmission Risk Interventions     No data to display

## 2023-06-17 NOTE — Progress Notes (Signed)
 Mobility Specialist - Progress Note   06/17/23 1051  Mobility  Activity Ambulated with assistance in hallway;Ambulated with assistance to bathroom  Level of Assistance Modified independent, requires aide device or extra time  Assistive Device Front wheel walker  Distance Ambulated (ft) 500 ft  Activity Response Tolerated well  Mobility Referral Yes  Mobility visit 1 Mobility  Mobility Specialist Start Time (ACUTE ONLY) 1040  Mobility Specialist Stop Time (ACUTE ONLY) 1050  Mobility Specialist Time Calculation (min) (ACUTE ONLY) 10 min   Pt received in bed and agreeable to mobility. Prior to ambulating, pt requested assistance to the bathroom. Pt to recliner after session with all needs met.   [facility]

## 2023-06-17 NOTE — Plan of Care (Signed)

## 2023-06-18 ENCOUNTER — Telehealth: Payer: Self-pay | Admitting: Hematology

## 2023-06-18 ENCOUNTER — Other Ambulatory Visit: Payer: Self-pay

## 2023-06-18 DIAGNOSIS — C153 Malignant neoplasm of upper third of esophagus: Secondary | ICD-10-CM

## 2023-06-18 NOTE — Progress Notes (Signed)
 PATIENT NAVIGATOR PROGRESS NOTE  Name: Melvin Hudson Date: 06/18/2023 MRN: [id number]  DOB: 01/25/1956   Reason for visit:  Follow-up  Comments:   Urgent referral place per Dr. Candise Chambers request.  Message sent to Dietician team for follow-up.   Time spent counseling/coordinating care: 15-30 minutes

## 2023-06-18 NOTE — Progress Notes (Signed)
 ASSESSMENT & PLAN:  Esophageal squamous cell carcinoma, newly diagnosed -Patient admitted on 06/11/2023 with progressive dysphagia, dyspnea, inability to swallow and weight loss. -PET scan done 06/11/2023 shows hypermetabolic circumferential esophageal mass involving cervical esophagus and upper thoracic esophagus.  No findings of distant mets. - Plan to start chemo/RT after discharge. - Medical oncology/Dr. Maryalice Smaller following   Dysphagia Malnutrition Weight loss Generalized weakness - Likely due to malignancy - Status post G-tube for feedings - Continue PT/OT.  Encourage patient out of bed and to participate in physical therapy. 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  He is very pleasant.  Reports that he feels pretty much "the same", remains difficult to swallow.  Still with slight pain at insertion of G-tube site.  Legs beginning to feel a little better.  Still feels weak.  No other acute complaints offered.   Objective:      Vitals:    06/16/23 1948 06/17/23 0357  BP: 129/85 (!) 128/91  Pulse: 82 77  Resp: 18 18  Temp: 98.7 F (37.1 C) 98 F (36.7 C)  SpO2: 96% 97%      Intake/Output Summary (Last 24 hours) at 06/17/2023 1001 Last data filed at 06/17/2023 0559    Gross per 24 hour  Intake 120 ml  Output 300 ml  Net -180 ml        PHYSICAL EXAMINATION: ECOG PERFORMANCE STATUS: 3 - Symptomatic, >50% confined to bed       Vitals:    06/16/23 1948 06/17/23 0357  BP: 129/85 (!) 128/91  Pulse: 82 77  Resp: 18 18  Temp: 98.7 F (37.1 C) 98 F (36.7 C)  SpO2: 96% 97%         Filed Weights    06/12/23 0733 06/13/23 0433 06/14/23 0412  Weight: 247 lb 15.9 oz (112.5 kg) 253 lb 15.5 oz (115.2 kg) 252 lb 6.8 oz (114.5 kg)      GENERAL: alert, +  generalized weakness, no distress and comfortable SKIN: skin color, texture, turgor are normal, no rashes or significant lesions EYES: normal, conjunctiva are pink and non-injected, sclera clear OROPHARYNX: no exudate, no erythema and lips, buccal mucosa, and tongue normal  NECK: supple, thyroid  normal size, non-tender, without nodularity LYMPH: no palpable lymphadenopathy in the cervical, axillary or inguinal LUNGS: clear to auscultation and percussion with normal breathing effort HEART: regular rate & rhythm and no murmurs and no lower extremity edema ABDOMEN: abdomen soft, non-tender and normal bowel sounds MUSCULOSKELETAL: no cyanosis of digits and no clubbing  PSYCH: alert & oriented x 3 with fluent speech NEURO: no focal motor/sensory deficits     All questions were answered. The patient knows to call the clinic with any problems, questions or concerns.   The total time spent in the appointment was 40 minutes encounter with patient including review of chart and various tests results, discussions about plan of care and coordination of care plan   Jacqualin Mate, NP 06/17/2023 10:01 AM                Labs Reviewed:  Recent Labs       Lab Results  Component Value Date    WBC 7.9 06/12/2023    HGB 13.5 06/12/2023  HCT 39.2 06/12/2023    MCV 87.3 06/12/2023    PLT 269 06/12/2023      Recent Labs (within last 365 days)          Recent Labs    04/27/23 0829 05/27/23 0649 06/12/23 0505 06/14/23 0853 06/15/23 0539 06/16/23 0536  NA 139 138 136  --   --  141  K 4.4 4.0 3.7 3.8 3.7 3.7  CL 100 102 101  --   --  103  CO2 24 25 26   --   --  26  GLUCOSE 90 88 85  --   --  118*  BUN 6* 6* 9  --   --  21  CREATININE 0.92 0.90 0.58*  --   --  0.53*  CALCIUM  9.7 9.2 8.7*  --   --  8.6*  GFRNONAA  --  >60 >60  --   --  >60  PROT 7.1  --   --   --   --   --   ALBUMIN 4.3  --   --   --   --   --   AST 15  --   --   --   --   --   ALT 10  --   --   --   --   --   ALKPHOS 177*   --   --   --   --   --   BILITOT 1.1  --   --   --   --   --         Studies Reviewed:   Imaging Results  VAS US  LOWER EXTREMITY VENOUS (DVT) Result Date: 06/16/2023  Lower Venous DVT Study Patient Name:  LAYMON MAHALA  Date of Exam:   06/16/2023 Medical Rec #: [id number]       Accession #:    [id number] Date of Birth: [birth year]        Patient Gender: M Patient Age:   68 years Exam Location:  [facility] Procedure:      VAS US  LOWER EXTREMITY VENOUS (DVT) Referring Phys: Sonja [place] --------------------------------------------------------------------------------  Other Indications: Patient complains of left leg pain for three days. Denies                    SOB. Performing Technologist: Adelia Homestead RVT RCS  Examination Guidelines: A complete evaluation includes B-mode imaging, spectral Doppler, color Doppler, and power Doppler as needed of all accessible portions of each vessel. Bilateral testing is considered an integral part of a complete examination. Limited examinations for reoccurring indications may be performed as noted. The reflux portion of the exam is performed with the patient in reverse Trendelenburg.  +-----+---------------+---------+-----------+----------+--------------+ RIGHTCompressibilityPhasicitySpontaneityPropertiesThrombus Aging +-----+---------------+---------+-----------+----------+--------------+ CFV  Full           Yes      Yes                                 +-----+---------------+---------+-----------+----------+--------------+   +---------+---------------+---------+-----------+----------+--------------+ LEFT     CompressibilityPhasicitySpontaneityPropertiesThrombus Aging +---------+---------------+---------+-----------+----------+--------------+ CFV      Full           Yes      Yes                                 +---------+---------------+---------+-----------+----------+--------------+ SFJ      Full  Yes      Yes                                  +---------+---------------+---------+-----------+----------+--------------+ FV Prox  Partial        No       No                   Acute          +---------+---------------+---------+-----------+----------+--------------+ FV Mid   Partial        No       No                   Acute          +---------+---------------+---------+-----------+----------+--------------+ FV DistalPartial        No       No                   Acute          +---------+---------------+---------+-----------+----------+--------------+ PFV      Full           Yes      Yes                                 +---------+---------------+---------+-----------+----------+--------------+ POP      Partial        No       No                   Acute          +---------+---------------+---------+-----------+----------+--------------+ PTV      None           No       No                   Acute          +---------+---------------+---------+-----------+----------+--------------+ PERO     None           No       No                   Acute          +---------+---------------+---------+-----------+----------+--------------+ Soleal   None           No       No                   Acute          +---------+---------------+---------+-----------+----------+--------------+ Gastroc  Full                                                        +---------+---------------+---------+-----------+----------+--------------+ GSV      Full           Yes      Yes                                 +---------+---------------+---------+-----------+----------+--------------+ SSV      Partial        No       No  Acute          +---------+---------------+---------+-----------+----------+--------------+   Findings reported to Sonja Manchester at 2:10 pm.  Summary: RIGHT: - No evidence of common femoral vein obstruction.  LEFT: - Findings consistent with acute deep vein thrombosis involving the  left femoral vein, left popliteal vein, left posterior tibial veins, left peroneal veins, and left soleal veins.   *See table(s) above for measurements and observations.    Preliminary     NM PET Image Restag (PS) Skull Base To Thigh Result Date: 06/16/2023 CLINICAL DATA:  Initial treatment strategy for proximal esophageal squamous cell carcinoma. History of tonsillar carcinoma. EXAM: NUCLEAR MEDICINE PET SKULL BASE TO THIGH TECHNIQUE: 11.6 mCi F-18 FDG was injected intravenously. Full-ring PET imaging was performed from the skull base to thigh after the radiotracer. CT data was obtained and used for attenuation correction and anatomic localization. Fasting blood glucose: 113 mg/dl COMPARISON:  PET-CT 40/98/1191 chest CT 04/28/2023 and esophagram 03/05/2023 FINDINGS: Mediastinal blood pool activity: SUV max 3.1 Liver activity: SUV max NA NECK: Proximal esophageal mass discussed in the chest section below. Otherwise unremarkable. Incidental CT findings: None. CHEST: Hypermetabolic circumferential esophageal mass involves the cervical esophagus and upper thoracic esophagus, extending over a 7.6 cm vertical excursion, maximum SUV 16.2, compatible with malignancy. Upper left paratracheal lymph node 0.8 cm in short axis on image 58 series 4 with maximum SUV 4.8. Incidental CT findings: Coronary, aortic arch, and branch vessel atherosclerotic vascular disease. ABDOMEN/PELVIS: No significant abnormal hypermetabolic activity in this region. Incidental CT findings: Atherosclerosis is present, including aortoiliac atherosclerotic disease. Sigmoid colon diverticulosis. SKELETON: No significant abnormal hypermetabolic activity in this region. 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 IMPRESSION: 1. Hypermetabolic circumferential esophageal mass involving the cervical esophagus and upper thoracic esophagus, extending over a 7.6 cm vertical excursion, maximum SUV 16.2, compatible with  malignancy. 2. Upper left paratracheal lymph node in close proximity to the esophageal mass measures 0.8 cm in short axis with maximum SUV 4.8, suspicious for metastatic disease. 3. No findings of distant metastatic disease. 4. Coronary, aortic arch, and branch vessel atherosclerotic vascular disease. Aortic Atherosclerosis (ICD10-I70.0). 5. Sigmoid colon diverticulosis. 6. 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 Electronically Signed   By: Freida Jes M.D.   On: 06/16/2023 12:28    IR GASTROSTOMY TUBE MOD SED Result Date: 06/12/2023 INDICATION: 68 year old male with esophageal cancer and dysphagia. EXAM: Fluoroscopically guided placement of percutaneous push-in balloon retention gastrostomy tube Interventional Radiologist:  Roxie Cord, MD MEDICATIONS: 2 g Ancef ; Antibiotics were administered within 1 hour of the procedure. ANESTHESIA/SEDATION: Versed  3 mg IV; Fentanyl  100 mcg IV administered by the radiology nurse. Moderate Sedation Time:  11 minutes The patient's vital signs and level of consciousness were continuously monitored during the procedure by the interventional radiology nurse under my direct supervision. CONTRAST:  20mL OMNIPAQUE  IOHEXOL  300 MG/ML  SOLN FLUOROSCOPY: Radiation exposure index: 58 mGy, air kerma COMPLICATIONS: None immediate. PROCEDURE: Informed written consent was obtained from the patient after a thorough discussion of the procedural risks, benefits and alternatives. All questions were addressed. Maximal Sterile Barrier Technique was utilized including caps, mask, sterile gowns, sterile gloves, sterile drape, hand hygiene and skin antiseptic. A timeout was performed prior to the initiation of the procedure. Maximal barrier sterile technique utilized including caps, mask, sterile gowns, sterile gloves, large sterile drape, hand hygiene, and chlorhexadine skin prep. An angled catheter was advanced over a wire under fluoroscopic guidance through the nose,  down the esophagus and  into the body of the stomach. The stomach was then insufflated with several 100 ml of air. Fluoroscopy confirms the location of the gastric bubble. The transverse colon is superior to the stomach and does not displace over the stomach or inferior to the stomach despite large volume inflation. The procedure was paused at this time and additional reference was made to the patient's prior CT scan of the abdomen and pelvis. This demonstrates that the colon does lie high above the stomach. However, the gastric antrum is in its usual position anterior and just deep to the abdominal wall. No evidence of colon, vessels or other obstruction between the anterior abdominal wall and the gastric body/antrum. Given the patient's clinical need for gastrostomy tube, the decision was made to proceed despite this atypical anatomic configuration. Under direct fluoroscopic guidance, a 2 absorbable T tacks were placed, and the anterior gastric wall drawn up against the anterior abdominal wall. Percutaneous access was then obtained into the mid gastric body with an 18 gauge sheath needle. Aspiration of air, and injection of contrast material under fluoroscopy confirmed needle placement. An Amplatz wire was advanced in the gastric body and the access needle exchanged for an assembly of a 10 x 80 mm non compliant athletics balloon placed coaxially through a 20 French balloon retention gastrostomy tube. The balloon was advanced across the trans abdominal tract. The and carefully inflated to full effacement. At the balloon was deflated, the balloon and G tube assembly was advanced over the wire and into the stomach. The retention balloon was filled with 20 mL saline and pulled back against the anterior abdominal wall. The balloon and wire were removed. Contrast was injected through the gastrostomy tube confirming its location within the stomach. The tube was then flushed with saline. The tube was then secured with the  external bumper and capped. The patient will be observed for several hours with the newly placed tube on low wall suction to evaluate for any post procedure complication. The patient tolerated the procedure well, there is no immediate complication. IMPRESSION: Successful placement of a 20 French push-in balloon retention gastrostomy tube. Electronically Signed   By: Fernando Hoyer M.D.   On: 06/12/2023 16:47

## 2023-06-19 ENCOUNTER — Ambulatory Visit
Admission: RE | Admit: 2023-06-19 | Discharge: 2023-06-19 | Disposition: A | Discharge status: Home / Self Care | Source: Ambulatory Visit | Attending: Radiation Oncology | Admitting: Radiation Oncology

## 2023-06-19 ENCOUNTER — Inpatient Hospital Stay: Attending: Nurse Practitioner | Admitting: Nutrition

## 2023-06-19 ENCOUNTER — Other Ambulatory Visit: Payer: Self-pay

## 2023-06-19 ENCOUNTER — Other Ambulatory Visit: Payer: Self-pay | Admitting: Radiation Oncology

## 2023-06-19 ENCOUNTER — Encounter: Payer: Self-pay | Admitting: *Deleted

## 2023-06-19 VITALS — BP 140/90 | HR 95 | Temp 97.9°F | Resp 20 | Ht 74.0 in | Wt 250.4 lb

## 2023-06-19 DIAGNOSIS — C153 Malignant neoplasm of upper third of esophagus: Secondary | ICD-10-CM | POA: Insufficient documentation

## 2023-06-19 DIAGNOSIS — Z510 Encounter for antineoplastic radiation therapy: Secondary | ICD-10-CM | POA: Insufficient documentation

## 2023-06-19 DIAGNOSIS — Z79899 Other long term (current) drug therapy: Secondary | ICD-10-CM | POA: Insufficient documentation

## 2023-06-19 DIAGNOSIS — C090 Malignant neoplasm of tonsillar fossa: Secondary | ICD-10-CM

## 2023-06-19 DIAGNOSIS — Z923 Personal history of irradiation: Secondary | ICD-10-CM | POA: Diagnosis not present

## 2023-06-19 DIAGNOSIS — Z5111 Encounter for antineoplastic chemotherapy: Secondary | ICD-10-CM | POA: Diagnosis present

## 2023-06-19 DIAGNOSIS — B370 Candidal stomatitis: Secondary | ICD-10-CM | POA: Insufficient documentation

## 2023-06-19 MED ORDER — SODIUM CHLORIDE 0.9% FLUSH: 10.0000 mL | Freq: Once | INTRAVENOUS | Status: DC

## 2023-06-19 MED ORDER — LORAZEPAM 0.5 MG PO TABS: ORAL_TABLET | ORAL | 0 refills | Status: DC

## 2023-06-19 NOTE — Addendum Note (Signed)
 Encounter addended by: Manav Pierotti M, RN on: 06/19/2023 1:54 PM  Actions taken: Order list changed, Diagnosis association updated

## 2023-06-19 NOTE — Progress Notes (Signed)
 Has armband been applied?  Yes.    Does patient have an allergy to IV contrast dye?: No.   Has patient ever received premedication for IV contrast dye?: No.   Date of lab work: 06/16/2023 BUN: 21 CR: 0.53 eGFR: >60  Does patient take metformin ?: No.  IV site: Left Distal Forearm  Has IV site been added to flowsheet?  Yes.

## 2023-06-19 NOTE — Progress Notes (Signed)
 68 year old male diagnosed with esophageal cancer and followed by Dr. Maryalice Smaller and Dr. Lurena Sally.  Patient will receive concurrent Carbo Taxol and radiation treatments.  Past medical history includes CHF, DM, gout, hypertension, obesity, tonsil cancer status post radiation therapy.  Medications include Pepto-Bismol, calcium , Pepcid , Prilosec, Zofran , MiraLAX , Compazine , Senokot-S.  Labs on April 29 include glucose 118, creatinine 0.53, phosphorus 3.3, magnesium 2.1.  DME: [employer].  Estimated nutrition needs: 2200-2400 cal, 100-120 g protein, greater than 2.2 L fluid.  TF Goal: 6 cartons Nutren 1.5 provides 2250 cal, 102 gm pro, 1146 mL water . 60 mL water  flush before and after bolus feedings. Additional 240 mL water  2 times daily.  Height: 6 feet 2 inches. Weight: 250 pounds 6.4 ounces on May 2. Patient weighed 285 pounds January 2025. BMI: 32.15.  Patient with new esophageal cancer diagnosis.  Patient received PEG placement in the hospital.  He was recently discharged.  Patient appears to have a good understanding of how to use his feeding tube for nutrition.  He did have questions about how to clean his feeding tube site.  He is set up with home health already and has formula provided.  He is here with his sister.  Patient currently denies nutrition impact symptoms.  Confirms that he cannot swallow anything. Anticipate long term need for tube feeding.  Nutrition diagnosis: Food and nutrition related knowledge deficit related to cancer as evidenced by instructions needed for feeding tube care.  Intervention: Educated patient on how to clean feeding tube and provided written fact sheet. Educated on administering tube feeding using patient preference. Patient prefers to give 300 mL of Nutren 1.5-5 times daily with 60 mL free water  before and after boluses.  Patient will give additional 240 mL free water  twice daily between bolus feeds. Written instructions provided.  Contact information given.   All questions answered to patient's satisfaction.  Monitoring, evaluation, goals: Patient will tolerate tube feeding to provide greater than 90% estimated nutrition needs for weight maintenance.  Next visit: Wednesday, May 7 during infusion with Ottie Blonder.  **Disclaimer: This note was dictated with voice recognition software. Similar sounding words can inadvertently be transcribed and this note may contain transcription errors which may not have been corrected upon publication of note.**

## 2023-06-20 ENCOUNTER — Other Ambulatory Visit: Payer: Self-pay

## 2023-06-22 ENCOUNTER — Other Ambulatory Visit: Payer: Self-pay

## 2023-06-22 ENCOUNTER — Inpatient Hospital Stay

## 2023-06-23 DIAGNOSIS — Z510 Encounter for antineoplastic radiation therapy: Secondary | ICD-10-CM | POA: Diagnosis not present

## 2023-06-23 NOTE — Assessment & Plan Note (Signed)
-  [id number], invasive squamous cell carcinoma -Present with progressive dysphagia since December 2024. -EGD on 05/27/2023 which showed a mass in the proximal esophagus, 2 to 3 cm below introitus.  Biopsy confirmed invasive squamous cell carcinoma.  -PET scan showed hypermetabolic lesion in the proximal esophagus, with a positive peritracheal lymph node.  No distant metastasis -Status post PEG feeding tube placement  -plan to start chemoRT on 06/24/2023

## 2023-06-24 ENCOUNTER — Inpatient Hospital Stay

## 2023-06-24 ENCOUNTER — Inpatient Hospital Stay: Admitting: Dietician

## 2023-06-24 ENCOUNTER — Other Ambulatory Visit: Payer: Self-pay

## 2023-06-24 ENCOUNTER — Inpatient Hospital Stay (HOSPITAL_BASED_OUTPATIENT_CLINIC_OR_DEPARTMENT_OTHER): Admitting: Hematology

## 2023-06-24 ENCOUNTER — Ambulatory Visit
Admission: RE | Admit: 2023-06-24 | Discharge: 2023-06-19 | Discharge status: Home / Self Care | Source: Ambulatory Visit | Attending: Radiation Oncology | Admitting: Radiation Oncology

## 2023-06-24 VITALS — BP 126/80 | HR 80 | Temp 98.0°F | Resp 17 | Wt 246.8 lb

## 2023-06-24 DIAGNOSIS — C153 Malignant neoplasm of upper third of esophagus: Secondary | ICD-10-CM

## 2023-06-24 DIAGNOSIS — Z510 Encounter for antineoplastic radiation therapy: Secondary | ICD-10-CM | POA: Diagnosis not present

## 2023-06-24 LAB — CBC WITH DIFFERENTIAL (CANCER CENTER ONLY)
Abs Immature Granulocytes: 0.22 10*3/uL — ABNORMAL HIGH (ref 0.00–0.07)
Basophils Absolute: 0.1 10*3/uL (ref 0.0–0.1)
Basophils Relative: 1 %
Eosinophils Absolute: 0.5 10*3/uL (ref 0.0–0.5)
Eosinophils Relative: 5 %
HCT: 39.2 % (ref 39.0–52.0)
Hemoglobin: 13.8 g/dL (ref 13.0–17.0)
Immature Granulocytes: 2 %
Lymphocytes Relative: 15 %
Lymphs Abs: 1.4 10*3/uL (ref 0.7–4.0)
MCH: 29.1 pg (ref 26.0–34.0)
MCHC: 35.2 g/dL (ref 30.0–36.0)
MCV: 82.7 fL (ref 80.0–100.0)
Monocytes Absolute: 0.9 10*3/uL (ref 0.1–1.0)
Monocytes Relative: 9 %
Neutro Abs: 6.4 10*3/uL (ref 1.7–7.7)
Neutrophils Relative %: 68 %
Platelet Count: 425 10*3/uL — ABNORMAL HIGH (ref 150–400)
RBC: 4.74 MIL/uL (ref 4.22–5.81)
RDW: 18.6 % — ABNORMAL HIGH (ref 11.5–15.5)
WBC Count: 9.4 10*3/uL (ref 4.0–10.5)
nRBC: 0 % (ref 0.0–0.2)

## 2023-06-24 LAB — CMP (CANCER CENTER ONLY)
ALT: 24 U/L (ref 0–44)
AST: 22 U/L (ref 15–41)
Albumin: 4.1 g/dL (ref 3.5–5.0)
Alkaline Phosphatase: 127 U/L — ABNORMAL HIGH (ref 38–126)
Anion gap: 7 (ref 5–15)
BUN: 14 mg/dL (ref 8–23)
CO2: 31 mmol/L (ref 22–32)
Calcium: 9.4 mg/dL (ref 8.9–10.3)
Chloride: 100 mmol/L (ref 98–111)
Creatinine: 0.8 mg/dL (ref 0.61–1.24)
GFR, Estimated: >60 mL/min (ref >60)
Glucose, Bld: 131 mg/dL — ABNORMAL HIGH (ref 70–99)
Potassium: 4.2 mmol/L (ref 3.5–5.1)
Sodium: 138 mmol/L (ref 135–145)
Total Bilirubin: 0.9 mg/dL (ref 0.0–1.2)
Total Protein: 7.6 g/dL (ref 6.5–8.1)

## 2023-06-24 LAB — RAD ONC ARIA SESSION SUMMARY
Course Elapsed Days: 0
Plan Fractions Treated to Date: 1
Plan Prescribed Dose Per Fraction: 1.8 Gy
Plan Total Fractions Prescribed: 25
Plan Total Prescribed Dose: 45 Gy
Reference Point Dosage Given to Date: 1.8 Gy
Reference Point Session Dosage Given: 1.8 Gy
Session Number: 1

## 2023-06-24 MED ORDER — PALONOSETRON HCL INJECTION 0.25 MG/5ML
0.2500 mg | Freq: Once | INTRAVENOUS | Status: AC
  Administered 2023-06-24: 0.25 mg via INTRAVENOUS
  Filled 2023-06-24: qty 5

## 2023-06-24 MED ORDER — SODIUM CHLORIDE 0.9 % IV SOLN: INTRAVENOUS | Status: DC

## 2023-06-24 MED ORDER — FAMOTIDINE IN NACL 20-0.9 MG/50ML-% IV SOLN
20.0000 mg | Freq: Once | INTRAVENOUS | Status: AC
  Administered 2023-06-24: 20 mg via INTRAVENOUS
  Filled 2023-06-24: qty 50

## 2023-06-24 MED ORDER — DIPHENHYDRAMINE HCL 50 MG/ML IJ SOLN
25.0000 mg | Freq: Once | INTRAMUSCULAR | Status: AC
  Administered 2023-06-24: 25 mg via INTRAVENOUS
  Filled 2023-06-24: qty 1

## 2023-06-24 MED ORDER — SODIUM CHLORIDE 0.9 % IV SOLN
50.0000 mg/m2 | Freq: Once | INTRAVENOUS | Status: AC
  Administered 2023-06-24: 120 mg via INTRAVENOUS
  Filled 2023-06-24: qty 20

## 2023-06-24 MED ORDER — SODIUM CHLORIDE 0.9 % IV SOLN
275.8000 mg | Freq: Once | INTRAVENOUS | Status: AC
  Administered 2023-06-24: 280 mg via INTRAVENOUS
  Filled 2023-06-24: qty 28

## 2023-06-24 MED ORDER — DEXAMETHASONE SODIUM PHOSPHATE 10 MG/ML IJ SOLN
10.0000 mg | Freq: Once | INTRAMUSCULAR | Status: AC
  Administered 2023-06-24: 10 mg via INTRAVENOUS
  Filled 2023-06-24: qty 1

## 2023-06-24 NOTE — Progress Notes (Signed)
 [facility]   [phone] Fax:([phone]) [phone]   Clinic Follow up Note   Patient Care Team: Lorella Roles, MD as PCP - General (Family Medicine) Olinda Bertrand, DO as PCP - Cardiology (Cardiology) Clance, Deena Farrier, MD as Referring Physician (Pulmonary Disease) Almeda Jacobs, MD as Consulting Physician (Hematology and Oncology) Colie Dawes, MD as Attending Physician (Radiation Oncology) Sonja North York, MD as Consulting Physician (Hematology and Oncology)  Date of Service:  06/24/2023  CHIEF COMPLAINT: f/u of esophageal cancer  CURRENT THERAPY:  Concurrent chemoradiation with weekly carboplatin and paclitaxel  Oncology History   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 -EGD on 05/27/2023 which showed a mass in the proximal esophagus, 2 to 3 cm below introitus.  Biopsy confirmed invasive squamous cell carcinoma.  -PET scan showed hypermetabolic lesion in the proximal esophagus, with a positive peritracheal lymph node.  No distant metastasis -Status post PEG feeding tube placement  -plan to start chemoRT on 06/24/2023  Assessment & Plan Esophageal cancer Newly diagnosed esophageal cancer. Initiated chemotherapy and radiation therapy today. Experiencing mild pain around the feeding tube site with some dry discharge, but no bleeding. No nausea reported. Bowel movements have been irregular, with recent use of a laxative. Currently using a feeding tube for nutrition and medication administration. No oral intake due to choking on liquids. No cough or shortness of breath reported. Discussed potential need for a PICC line if venous access becomes difficult. Transportation for daily treatments has been arranged through [place] and a friend for return trips. - Continue chemotherapy and radiation therapy as planned. - Monitor feeding tube site for signs of infection or  complications. - Use Miralax  mixed with water  through the feeding tube to manage constipation. - Ensure adequate nutrition through the feeding tube. - Consider PICC line placement if venous access becomes problematic.  Anxiety Experiencing anxiety, previously managed with clonazepam  prescribed by primary care. Clonazepam  is effective but causes drowsiness. Discussed the use of dissolvable clonazepam  for ease of administration through the feeding tube. Transportation assistance is arranged to avoid driving due to drowsiness. - Use clonazepam  for anxiety management, ensuring it is dissolved for administration through the feeding tube. - Monitor for drowsiness and avoid driving.  Moderate protein and calorie malnutrition - Secondary to dysphagia and cancer - He is on tube feeds now, follow-up with with dietitian  Plan - Lab reviewed, adequate for treatment, will proceed to first cycle carboplatin and paclitaxel today, and continue weekly with radiation - Due to poor IV access, will place a PICC line next Monday - Follow-up in 1 week   SUMMARY OF ONCOLOGIC HISTORY: Oncology History  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HCC)  06/14/2013 Procedure   Biopsy of the tonsil confirmrf squamous cell carcinoma. 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  History of Present Illness Melvin Hudson is a 68 year old male with newly diagnosed esophageal cancer who presents for initiation of chemotherapy and radiation therapy.  He has a feeding tube with mild pain and dry discharge at the site, but no bleeding. He maintains regular cleaning and experiences no nausea. He had a bowel movement today after using a crushed laxative through the tube. He takes clonazepam  for anxiety, crushing the non-dissolvable form for sublingual use. He lives alone with his sister nearby for support, assisting with medication pickup. He receives tube feeding supplies at home and is learning to manage orders. Oral intake causes choking, so all nutrition is via the feeding tube. No issues with intravenous access in the past.     All other systems were reviewed with the patient and are negative.  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    SURGICAL HISTORY: Past Surgical History:  Procedure Laterality Date   COLONOSCOPY Left 04/11/2014   Procedure: COLONOSCOPY;  Surgeon: Evangeline Hilts, MD;  Location: [facility] ENDOSCOPY;  Service: Endoscopy;  Laterality: Left;   COLONOSCOPY N/A 04/20/2014   Procedure: COLONOSCOPY;  Surgeon: Yvetta Herbert, MD;  Location: [facility] ENDOSCOPY;  Service: Endoscopy;  Laterality: N/A;    COLONOSCOPY W/ BIOPSIES AND POLYPECTOMY     benign   DIRECT LARYNGOSCOPY  01/05/2014   DIRECT LARYNGOSCOPY N/A 01/05/2014   Procedure: DIRECT LARYNGOSCOPY AND BIOPSY;  Surgeon: Janita Mellow, MD;  Location: [facility] OR;  Service: ENT;  Laterality: N/A;   ESOPHAGOSCOPY WITH DILITATION N/A 05/27/2023   Procedure: ESOPHAGOSCOPY;  Surgeon: Janita Mellow, MD;  Location: [facility] OR;  Service: ENT;  Laterality: N/A;   IR GASTROSTOMY TUBE MOD SED  06/12/2023   KNEE ARTHROSCOPY Right ~ 1994   LEFT AND RIGHT HEART CATHETERIZATION WITH CORONARY/GRAFT ANGIOGRAM N/A 12/31/2010   Procedure: LEFT AND RIGHT HEART CATHETERIZATION WITH Estella Helling;  Surgeon: Jessica Morn, MD;  Location: [facility] CATH LAB;  Service: Cardiovascular;  Laterality: N/A;   MULTIPLE EXTRACTIONS WITH ALVEOLOPLASTY N/A 07/08/2013   Procedure: Extraction of tooth #'s 22, 27 with alveoloplasty and bilateal mandibular facial exostoses reductions;  Surgeon: Carol Chroman, DDS;  Location: [facility] ORS;  Service: Oral Surgery;  Laterality: N/A;   MULTIPLE TOOTH EXTRACTIONS  ~ 2008    I have reviewed the social history and family history with the patient and they are unchanged from previous note.  ALLERGIES:  is allergic to azilsartan and advil [ibuprofen].  MEDICATIONS:  Current Outpatient Medications  Medication Sig Dispense Refill   albuterol  (VENTOLIN  HFA) 108 (90 Base) MCG/ACT inhaler Inhale 2 puffs into the lungs every 4 (four) hours as needed for wheezing (Cough).  allopurinol  (ZYLOPRIM ) 300 MG tablet Take 300 mg by mouth every other day.     APIXABAN  (ELIQUIS ) VTE STARTER PACK (10MG  AND 5MG ) Take as directed on package: start with two-5mg  tablets twice daily for 7 days. On day 8, switch to one-5mg  tablet twice daily. 74 each 0   atorvastatin  (LIPITOR) 40 MG tablet Take 1 tablet (40 mg total) by mouth at bedtime. (Patient taking differently: Take 40 mg by mouth in the morning.) 90 tablet 1   bismuth subsalicylate (PEPTO BISMOL) 262  MG/15ML suspension Take 30 mLs by mouth every 6 (six) hours as needed for indigestion or diarrhea or loose stools.     Blood Glucose Monitoring Suppl (ONETOUCH VERIO FLEX SYSTEM) w/Device KIT USE TO CHECK BLOOD SUGAR TWICE DAILY     calcium  carbonate (TUMS - DOSED IN MG ELEMENTAL CALCIUM ) 500 MG chewable tablet Chew 2 tablets (400 mg of elemental calcium  total) by mouth 3 (three) times daily with meals. (Patient taking differently: Chew 400 mg of elemental calcium  by mouth daily as needed.) 180 tablet 1   clonazePAM  (KLONOPIN ) 1 MG disintegrating tablet Take 1 mg by mouth daily.     diphenhydrAMINE  (BENADRYL ) 25 MG tablet Take 1 tablet (25 mg total) by mouth every 6 (six) hours. (Patient taking differently: Take 25 mg by mouth every 6 (six) hours as needed for allergies or sleep.) 20 tablet 0   famotidine  (PEPCID ) 40 MG/5ML suspension Place 2.5 mLs (20 mg total) into feeding tube daily. 50 mL 0   gabapentin  (NEURONTIN ) 300 MG capsule Take 300 mg by mouth 3 (three) times daily.   2   hydrocortisone  (ANUSOL -HC) 2.5 % rectal cream Place 1 Application rectally daily as needed for hemorrhoids or anal itching.     isosorbide -hydrALAZINE  (BIDIL ) 20-37.5 MG tablet Take 1 tablet by mouth in the morning and at bedtime. 90 tablet 3   lactose free nutrition (BOOST) LIQD Take 237 mLs by mouth daily.     lidocaine  (XYLOCAINE ) 2 % solution Patient: Mix 1part 2% viscous lidocaine , 1part H20. Swallow 10mL of diluted mixture, before meals and at bedtime, up to QID 200 mL 3   LORazepam  (ATIVAN ) 0.5 MG tablet Take 1 tablet PRN claustrophobia 20 minutes prior to radiation. Will need a driver. Use only as needed. 5 tablet 0   omeprazole  (PRILOSEC) 20 MG capsule Take 20 mg by mouth daily.   4   ondansetron  (ZOFRAN ) 8 MG tablet Take 1 tablet (8 mg total) by mouth every 8 (eight) hours as needed for nausea or vomiting. Start on the third day after chemotherapy. 30 tablet 1   ONETOUCH VERIO test strip USE TO CHECK BLOOD  SUGAR TWICE DAILY     polyethylene glycol (MIRALAX  / GLYCOLAX ) 17 g packet Take 17 g by mouth daily. (Patient taking differently: Take 17 g by mouth daily as needed.) 14 each 0   prochlorperazine  (COMPAZINE ) 10 MG tablet Take 1 tablet (10 mg total) by mouth every 6 (six) hours as needed for nausea or vomiting. 30 tablet 1   Protein (FEEDING SUPPLEMENT, PROSOURCE TF20,) liquid Place 60 mLs into feeding tube 2 (two) times daily.     psyllium (HYDROCIL/METAMUCIL) 95 % PACK Take 1 packet by mouth daily. (Patient taking differently: Take 1 packet by mouth daily as needed for mild constipation or moderate constipation.) 240 each 0   senna-docusate (SENOKOT-S) 8.6-50 MG tablet Take 1 tablet by mouth 2 (two) times daily. (Patient taking differently: Take 1 tablet by mouth daily as needed  for mild constipation or moderate constipation.) 60 tablet 1   Water  For Irrigation, Sterile (FREE WATER ) SOLN Place 100 mLs into feeding tube every 6 (six) hours.     No current facility-administered medications for this visit.    PHYSICAL EXAMINATION: ECOG PERFORMANCE STATUS: 2 - Symptomatic, <50% confined to bed  There were no vitals filed for this visit. Wt Readings from Last 3 Encounters:  06/24/23 246 lb 12 oz (111.9 kg)  06/19/23 250 lb 6.4 oz (113.6 kg)  06/14/23 252 lb 6.8 oz (114.5 kg)     GENERAL:alert, no distress and comfortable SKIN: skin color, texture, turgor are normal, no rashes or significant lesions EYES: normal, Conjunctiva are pink and non-injected, sclera clear NECK: supple, thyroid  normal size, non-tender, without nodularity LYMPH:  no palpable lymphadenopathy in the cervical, axillary  LUNGS: clear to auscultation and percussion with normal breathing effort HEART: regular rate & rhythm and no murmurs and no lower extremity edema ABDOMEN:abdomen soft, non-tender and normal bowel sounds Musculoskeletal:no cyanosis of digits and no clubbing  NEURO: alert & oriented x 3 with fluent speech,  no focal motor/sensory deficits  Physical Exam   LABORATORY DATA:  I have reviewed the data as listed    Latest Ref Rng & Units 06/24/2023    9:20 AM 06/12/2023    5:05 AM 05/27/2023    6:49 AM  CBC  WBC 4.0 - 10.5 K/uL 9.4  7.9  9.3   Hemoglobin 13.0 - 17.0 g/dL 75.6  43.3  29.5   Hematocrit 39.0 - 52.0 % 39.2  39.2  41.9   Platelets 150 - 400 K/uL 425  269  330         Latest Ref Rng & Units 06/24/2023    9:20 AM 06/16/2023    5:36 AM 06/15/2023    5:39 AM  CMP  Glucose 70 - 99 mg/dL 188  416    BUN 8 - 23 mg/dL 14  21    Creatinine 6.06 - 1.24 mg/dL 3.01  6.01    Sodium 093 - 145 mmol/L 138  141    Potassium 3.5 - 5.1 mmol/L 4.2  3.7  3.7   Chloride 98 - 111 mmol/L 100  103    CO2 22 - 32 mmol/L 31  26    Calcium  8.9 - 10.3 mg/dL 9.4  8.6    Total Protein 6.5 - 8.1 g/dL 7.6     Total Bilirubin 0.0 - 1.2 mg/dL 0.9     Alkaline Phos 38 - 126 U/L 127     AST 15 - 41 U/L 22     ALT 0 - 44 U/L 24         RADIOGRAPHIC STUDIES: I have personally reviewed the radiological images as listed and agreed with the findings in the report. No results found.    No orders of the defined types were placed in this encounter.  All questions were answered. The patient knows to call the clinic with any problems, questions or concerns. No barriers to learning was detected. The total time spent in the appointment was 30 minutes.     Sonja Melmore, MD 06/24/2023

## 2023-06-24 NOTE — Progress Notes (Signed)
 Nutrition Follow-up:  Patient with esophageal cancer. Patient receiving concurrent chemoradiation therapy with weekly carboplatin + paclitaxel (first chemo 5/7).  S/p PEG 4/24 during hospital admission DME: [employer]  Met with patient in infusion. Patient reports not sleeping well last night secondary to anticipation of first chemo today. He is concerned about side effects. Patient lives alone. His sister comes to check on him after work in the evenings. Home health RN came over the weekend, scheduled for another visit 5/8 per pt. Patient reports tolerating tube feeds at goal (6 cartons Nutren 1.5). He splits this over 5 feedings. Patient reports giving 2-3 bottles of water  with feedings and additional flushes. Patient hoping to eat/drink orally again. He denies nausea, vomiting, diarrhea, constipation.    Medications: reviewed   Labs: glucose 131  Anthropometrics: Wt 246 lb 12 oz today   5/2 - 250 lb 6.4 oz 4/27 - 252 lb 6.87 oz   Estimated Energy Needs  Kcals: 2200-2400 Protein: 100-120 Fluid: > 2.2 L  NUTRITION DIAGNOSIS: Food and nutrition related knowledge deficit ongoing    INTERVENTION:  Continue giving 6 cartons Nutren 1.5 via tube split over 5 feedings. Flush with 60 ml water  before and after each bolus. Provide additional 240 ml [facility] BID HH RN to assist with enteral needs at home Contact information    MONITORING, EVALUATION, GOAL: wt trends, TF   NEXT VISIT: Wednesday May 14 during infusion

## 2023-06-24 NOTE — Patient Instructions (Addendum)
 [facility] - A DEPT OF [facility]  Discharge Instructions: Thank you for choosing [facility] to provide your oncology and hematology care.   If you have a lab appointment with the Cancer Center, please go directly to the Cancer Center and check in at the registration area.   Wear comfortable clothing and clothing appropriate for easy access to any Portacath or PICC line.   We strive to give you quality time with your provider. You may need to reschedule your appointment if you arrive late (15 or more minutes).  Arriving late affects you and other patients whose appointments are after yours.  Also, if you miss three or more appointments without notifying the office, you may be dismissed from the clinic at the provider's discretion.      For prescription refill requests, have your pharmacy contact our office and allow 72 hours for refills to be completed.    Today you received the following chemotherapy and/or immunotherapy agents Taxol & Carbo      To help prevent nausea and vomiting after your treatment, we encourage you to take your nausea medication as directed.  BELOW ARE SYMPTOMS THAT SHOULD BE REPORTED IMMEDIATELY: *FEVER GREATER THAN 100.4 F (38 C) OR HIGHER *CHILLS OR SWEATING *NAUSEA AND VOMITING THAT IS NOT CONTROLLED WITH YOUR NAUSEA MEDICATION *UNUSUAL SHORTNESS OF BREATH *UNUSUAL BRUISING OR BLEEDING *URINARY PROBLEMS (pain or burning when urinating, or frequent urination) *BOWEL PROBLEMS (unusual diarrhea, constipation, pain near the anus) TENDERNESS IN MOUTH AND THROAT WITH OR WITHOUT PRESENCE OF ULCERS (sore throat, sores in mouth, or a toothache) UNUSUAL RASH, SWELLING OR PAIN  UNUSUAL VAGINAL DISCHARGE OR ITCHING   Items with * indicate a potential emergency and should be followed up as soon as possible or go to the Emergency Department if any problems should occur.  Please show the CHEMOTHERAPY ALERT CARD or  IMMUNOTHERAPY ALERT CARD at check-in to the Emergency Department and triage nurse.  Should you have questions after your visit or need to cancel or reschedule your appointment, please contact [facility] - A DEPT OF [facility]  Dept: [phone]  and follow the prompts.  Office hours are 8:00 a.m. to 4:30 p.m. Monday - Friday. Please note that voicemails left after 4:00 p.m. may not be returned until the following business day.  We are closed weekends and major holidays. You have access to a nurse at all times for urgent questions. Please call the main number to the clinic Dept: [phone] and follow the prompts.   For any non-urgent questions, you may also contact your provider using MyChart. We now offer e-Visits for anyone 77 and older to request care online for non-urgent symptoms. For details visit [email or web address].   Also download the MyChart app! Go to the app store, search "MyChart", open the app, select [facility], and log in with your MyChart username and password.  Paclitaxel Injection What is this medication? PACLITAXEL (PAK li TAX el) treats some types of cancer. It works by slowing down the growth of cancer cells. This medicine may be used for other purposes; ask your health care provider or pharmacist if you have questions. COMMON BRAND NAME(S): Onxol, Taxol What should I tell my care team before I take this medication? They need to know if you have any of these conditions: Heart disease Liver disease Low white blood cell levels An unusual or allergic reaction to paclitaxel, other  medications, foods, dyes, or preservatives If you or your partner are pregnant or trying to get pregnant Breast-feeding How should I use this medication? This medication is injected into a vein. It is given by your care team in a hospital or clinic setting. Talk to your care team about the use of this medication in children. While it may be given to children  for selected conditions, precautions do apply. Overdosage: If you think you have taken too much of this medicine contact a poison control center or emergency room at once. NOTE: This medicine is only for you. Do not share this medicine with others. What if I miss a dose? Keep appointments for follow-up doses. It is important not to miss your dose. Call your care team if you are unable to keep an appointment. What may interact with this medication? Do not take this medication with any of the following: Live virus vaccines Other medications may affect the way this medication works. Talk with your care team about all of the medications you take. They may suggest changes to your treatment plan to lower the risk of side effects and to make sure your medications work as intended. This list may not describe all possible interactions. Give your health care provider a list of all the medicines, herbs, non-prescription drugs, or dietary supplements you use. Also tell them if you smoke, drink alcohol, or use illegal drugs. Some items may interact with your medicine. What should I watch for while using this medication? Your condition will be monitored carefully while you are receiving this medication. You may need blood work while taking this medication. This medication may make you feel generally unwell. This is not uncommon as chemotherapy can affect healthy cells as well as cancer cells. Report any side effects. Continue your course of treatment even though you feel ill unless your care team tells you to stop. This medication can cause serious allergic reactions. To reduce the risk, your care team may give you other medications to take before receiving this one. Be sure to follow the directions from your care team. This medication may increase your risk of getting an infection. Call your care team for advice if you get a fever, chills, sore throat, or other symptoms of a cold or flu. Do not treat yourself. Try  to avoid being around people who are sick. This medication may increase your risk to bruise or bleed. Call your care team if you notice any unusual bleeding. Be careful brushing or flossing your teeth or using a toothpick because you may get an infection or bleed more easily. If you have any dental work done, tell your dentist you are receiving this medication. Talk to your care team if you may be pregnant. Serious birth defects can occur if you take this medication during pregnancy. Talk to your care team before breastfeeding. Changes to your treatment plan may be needed. What side effects may I notice from receiving this medication? Side effects that you should report to your care team as soon as possible: Allergic reactions--skin rash, itching, hives, swelling of the face, lips, tongue, or throat Heart rhythm changes--fast or irregular heartbeat, dizziness, feeling faint or lightheaded, chest pain, trouble breathing Increase in blood pressure Infection--fever, chills, cough, sore throat, wounds that don't heal, pain or trouble when passing urine, general feeling of discomfort or being unwell Low blood pressure--dizziness, feeling faint or lightheaded, blurry vision Low red blood cell level--unusual weakness or fatigue, dizziness, headache, trouble breathing Painful swelling, warmth, or  redness of the skin, blisters or sores at the infusion site Pain, tingling, or numbness in the hands or feet Slow heartbeat--dizziness, feeling faint or lightheaded, confusion, trouble breathing, unusual weakness or fatigue Unusual bruising or bleeding Side effects that usually do not require medical attention (report to your care team if they continue or are bothersome): Diarrhea Hair loss Joint pain Loss of appetite Muscle pain Nausea Vomiting This list may not describe all possible side effects. Call your doctor for medical advice about side effects. You may report side effects to FDA at  1-800-FDA-1088. Where should I keep my medication? This medication is given in a hospital or clinic. It will not be stored at home. NOTE: This sheet is a summary. It may not cover all possible information. If you have questions about this medicine, talk to your doctor, pharmacist, or health care provider.  2024 Elsevier/Gold Standard (2021-06-25 00:00:00)  Carboplatin Injection What is this medication? CARBOPLATIN (KAR boe pla tin) treats some types of cancer. It works by slowing down the growth of cancer cells. This medicine may be used for other purposes; ask your health care provider or pharmacist if you have questions. COMMON BRAND NAME(S): Paraplatin What should I tell my care team before I take this medication? They need to know if you have any of these conditions: Blood disorders Hearing problems Kidney disease Recent or ongoing radiation therapy An unusual or allergic reaction to carboplatin, cisplatin, other medications, foods, dyes, or preservatives Pregnant or trying to get pregnant Breast-feeding How should I use this medication? This medication is injected into a vein. It is given by your care team in a hospital or clinic setting. Talk to your care team about the use of this medication in children. Special care may be needed. Overdosage: If you think you have taken too much of this medicine contact a poison control center or emergency room at once. NOTE: This medicine is only for you. Do not share this medicine with others. What if I miss a dose? Keep appointments for follow-up doses. It is important not to miss your dose. Call your care team if you are unable to keep an appointment. What may interact with this medication? Medications for seizures Some antibiotics, such as amikacin, gentamicin, neomycin, streptomycin, tobramycin Vaccines This list may not describe all possible interactions. Give your health care provider a list of all the medicines, herbs,  non-prescription drugs, or dietary supplements you use. Also tell them if you smoke, drink alcohol, or use illegal drugs. Some items may interact with your medicine. What should I watch for while using this medication? Your condition will be monitored carefully while you are receiving this medication. You may need blood work while taking this medication. This medication may make you feel generally unwell. This is not uncommon, as chemotherapy can affect healthy cells as well as cancer cells. Report any side effects. Continue your course of treatment even though you feel ill unless your care team tells you to stop. In some cases, you may be given additional medications to help with side effects. Follow all directions for their use. This medication may increase your risk of getting an infection. Call your care team for advice if you get a fever, chills, sore throat, or other symptoms of a cold or flu. Do not treat yourself. Try to avoid being around people who are sick. Avoid taking medications that contain aspirin, acetaminophen, ibuprofen, naproxen, or ketoprofen unless instructed by your care team. These medications may hide a fever. Be  careful brushing or flossing your teeth or using a toothpick because you may get an infection or bleed more easily. If you have any dental work done, tell your dentist you are receiving this medication. Talk to your care team if you wish to become pregnant or think you might be pregnant. This medication can cause serious birth defects. Talk to your care team about effective forms of contraception. Do not breast-feed while taking this medication. What side effects may I notice from receiving this medication? Side effects that you should report to your care team as soon as possible: Allergic reactions--skin rash, itching, hives, swelling of the face, lips, tongue, or throat Infection--fever, chills, cough, sore throat, wounds that don't heal, pain or trouble when passing  urine, general feeling of discomfort or being unwell Low red blood cell level--unusual weakness or fatigue, dizziness, headache, trouble breathing Pain, tingling, or numbness in the hands or feet, muscle weakness, change in vision, confusion or trouble speaking, loss of balance or coordination, trouble walking, seizures Unusual bruising or bleeding Side effects that usually do not require medical attention (report to your care team if they continue or are bothersome): Hair loss Nausea Unusual weakness or fatigue Vomiting This list may not describe all possible side effects. Call your doctor for medical advice about side effects. You may report side effects to FDA at 1-800-FDA-1088. Where should I keep my medication? This medication is given in a hospital or clinic. It will not be stored at home. NOTE: This sheet is a summary. It may not cover all possible information. If you have questions about this medicine, talk to your doctor, pharmacist, or health care provider.  2024 Elsevier/Gold Standard (2021-05-28 00:00:00)

## 2023-06-25 ENCOUNTER — Ambulatory Visit
Admission: RE | Admit: 2023-06-25 | Discharge: 2023-06-25 | Disposition: A | Discharge status: Home / Self Care | Source: Ambulatory Visit | Attending: Radiation Oncology | Admitting: Radiation Oncology

## 2023-06-25 ENCOUNTER — Other Ambulatory Visit: Payer: Self-pay

## 2023-06-25 ENCOUNTER — Telehealth: Payer: Self-pay

## 2023-06-25 DIAGNOSIS — Z510 Encounter for antineoplastic radiation therapy: Secondary | ICD-10-CM | POA: Diagnosis not present

## 2023-06-25 LAB — RAD ONC ARIA SESSION SUMMARY
Course Elapsed Days: 1
Plan Fractions Treated to Date: 2
Plan Prescribed Dose Per Fraction: 1.8 Gy
Plan Total Fractions Prescribed: 25
Plan Total Prescribed Dose: 45 Gy
Reference Point Dosage Given to Date: 3.6 Gy
Reference Point Session Dosage Given: 1.8 Gy
Session Number: 2

## 2023-06-25 NOTE — Telephone Encounter (Signed)
 LM for patient that this nurse was calling to see how they were doing after their treatment. Please call back to Dr. Latanya Maudlin nurse at [phone] if they have any questions or concerns regarding the treatment.

## 2023-06-25 NOTE — Telephone Encounter (Signed)
-----   Message from Nurse Isadore Marble sent at 06/24/2023  5:22 PM EDT ----- Regarding: FENG 1ST TIME TAXOL/CARBO Patient received 1st time taxol/carbo via PIV.  Tolerated well.  No s/s or c/o distress or discomfort.

## 2023-06-26 ENCOUNTER — Other Ambulatory Visit: Payer: Self-pay | Admitting: Hematology

## 2023-06-26 ENCOUNTER — Other Ambulatory Visit: Payer: Self-pay | Admitting: Radiation Oncology

## 2023-06-26 ENCOUNTER — Other Ambulatory Visit (HOSPITAL_COMMUNITY): Payer: Self-pay

## 2023-06-26 ENCOUNTER — Other Ambulatory Visit: Payer: Self-pay | Admitting: Nurse Practitioner

## 2023-06-26 ENCOUNTER — Ambulatory Visit: Admitting: Thoracic Surgery (Cardiothoracic Vascular Surgery)

## 2023-06-26 ENCOUNTER — Ambulatory Visit
Admission: RE | Admit: 2023-06-26 | Discharge: 2023-06-26 | Disposition: A | Discharge status: Home / Self Care | Source: Ambulatory Visit | Attending: Radiation Oncology

## 2023-06-26 ENCOUNTER — Other Ambulatory Visit: Payer: Self-pay

## 2023-06-26 ENCOUNTER — Ambulatory Visit
Admission: RE | Admit: 2023-06-26 | Discharge: 2023-06-26 | Disposition: A | Discharge status: Home / Self Care | Source: Ambulatory Visit | Attending: Radiation Oncology | Admitting: Radiation Oncology

## 2023-06-26 DIAGNOSIS — Z510 Encounter for antineoplastic radiation therapy: Secondary | ICD-10-CM | POA: Diagnosis not present

## 2023-06-26 LAB — RAD ONC ARIA SESSION SUMMARY
Course Elapsed Days: 2
Plan Fractions Treated to Date: 3
Plan Prescribed Dose Per Fraction: 1.8 Gy
Plan Total Fractions Prescribed: 25
Plan Total Prescribed Dose: 45 Gy
Reference Point Dosage Given to Date: 5.4 Gy
Reference Point Session Dosage Given: 1.8 Gy
Session Number: 3

## 2023-06-26 MED ORDER — HYDROCODONE-ACETAMINOPHEN 7.5-325 MG/15ML PO SOLN
10.0000 mL | Freq: Four times a day (QID) | ORAL | 0 refills | Status: DC | PRN
  Filled 2023-06-26: qty 473, 12d supply, fill #0

## 2023-06-26 MED ORDER — HYDROCODONE-ACETAMINOPHEN 7.5-325 MG/15ML PO SOLN
10.0000 mL | Freq: Four times a day (QID) | ORAL | 0 refills | Status: DC | PRN
Start: 2023-06-26 — End: 2023-06-26

## 2023-06-29 ENCOUNTER — Emergency Department (HOSPITAL_COMMUNITY)
Admission: EM | Admit: 2023-06-29 | Discharge: 2023-06-29 | Disposition: A | Discharge status: Home / Self Care | Attending: Emergency Medicine | Admitting: Emergency Medicine

## 2023-06-29 ENCOUNTER — Ambulatory Visit
Admission: RE | Admit: 2023-06-29 | Discharge: 2023-06-29 | Disposition: A | Discharge status: Home / Self Care | Source: Ambulatory Visit | Attending: Radiation Oncology

## 2023-06-29 ENCOUNTER — Other Ambulatory Visit: Payer: Self-pay

## 2023-06-29 ENCOUNTER — Emergency Department (HOSPITAL_COMMUNITY)

## 2023-06-29 ENCOUNTER — Encounter: Payer: Self-pay | Admitting: Radiation Oncology

## 2023-06-29 DIAGNOSIS — R079 Chest pain, unspecified: Secondary | ICD-10-CM | POA: Insufficient documentation

## 2023-06-29 DIAGNOSIS — C153 Malignant neoplasm of upper third of esophagus: Secondary | ICD-10-CM | POA: Insufficient documentation

## 2023-06-29 DIAGNOSIS — I110 Hypertensive heart disease with heart failure: Secondary | ICD-10-CM | POA: Insufficient documentation

## 2023-06-29 DIAGNOSIS — R61 Generalized hyperhidrosis: Secondary | ICD-10-CM | POA: Insufficient documentation

## 2023-06-29 DIAGNOSIS — Z8501 Personal history of malignant neoplasm of esophagus: Secondary | ICD-10-CM | POA: Diagnosis not present

## 2023-06-29 DIAGNOSIS — E119 Type 2 diabetes mellitus without complications: Secondary | ICD-10-CM | POA: Insufficient documentation

## 2023-06-29 DIAGNOSIS — I509 Heart failure, unspecified: Secondary | ICD-10-CM | POA: Insufficient documentation

## 2023-06-29 DIAGNOSIS — Z931 Gastrostomy status: Secondary | ICD-10-CM | POA: Insufficient documentation

## 2023-06-29 DIAGNOSIS — Z7901 Long term (current) use of anticoagulants: Secondary | ICD-10-CM | POA: Insufficient documentation

## 2023-06-29 LAB — COMPREHENSIVE METABOLIC PANEL WITH GFR
ALT: 19 U/L (ref 0–44)
AST: 24 U/L (ref 15–41)
Albumin: 3.8 g/dL (ref 3.5–5.0)
Alkaline Phosphatase: 114 U/L (ref 38–126)
Anion gap: 10 (ref 5–15)
BUN: 16 mg/dL (ref 8–23)
CO2: 28 mmol/L (ref 22–32)
Calcium: 8.9 mg/dL (ref 8.9–10.3)
Chloride: 98 mmol/L (ref 98–111)
Creatinine, Ser: 0.58 mg/dL — ABNORMAL LOW (ref 0.61–1.24)
GFR, Estimated: >60 mL/min (ref >60)
Glucose, Bld: 105 mg/dL — ABNORMAL HIGH (ref 70–99)
Potassium: 4.6 mmol/L (ref 3.5–5.1)
Sodium: 136 mmol/L (ref 135–145)
Total Bilirubin: 1.6 mg/dL — ABNORMAL HIGH (ref 0.0–1.2)
Total Protein: 7.3 g/dL (ref 6.5–8.1)

## 2023-06-29 LAB — URINALYSIS, ROUTINE W REFLEX MICROSCOPIC
Bacteria, UA: NONE SEEN
Bilirubin Urine: NEGATIVE
Glucose, UA: >=500 mg/dL — AB
Ketones, ur: 5 mg/dL — AB
Leukocytes,Ua: NEGATIVE
Nitrite: NEGATIVE
Protein, ur: NEGATIVE mg/dL
RBC / HPF: >50 RBC/hpf (ref 0–5)
Specific Gravity, Urine: 1.032 — ABNORMAL HIGH (ref 1.005–1.030)
pH: 6 (ref 5.0–8.0)

## 2023-06-29 LAB — RAD ONC ARIA SESSION SUMMARY
Course Elapsed Days: 5
Plan Fractions Treated to Date: 4
Plan Prescribed Dose Per Fraction: 1.8 Gy
Plan Total Fractions Prescribed: 25
Plan Total Prescribed Dose: 45 Gy
Reference Point Dosage Given to Date: 7.2 Gy
Reference Point Session Dosage Given: 1.8 Gy
Session Number: 4

## 2023-06-29 LAB — CBC
HCT: 38.7 % — ABNORMAL LOW (ref 39.0–52.0)
Hemoglobin: 12.9 g/dL — ABNORMAL LOW (ref 13.0–17.0)
MCH: 29.5 pg (ref 26.0–34.0)
MCHC: 33.3 g/dL (ref 30.0–36.0)
MCV: 88.4 fL (ref 80.0–100.0)
Platelets: 428 10*3/uL — ABNORMAL HIGH (ref 150–400)
RBC: 4.38 MIL/uL (ref 4.22–5.81)
RDW: 18.6 % — ABNORMAL HIGH (ref 11.5–15.5)
WBC: 7.9 10*3/uL (ref 4.0–10.5)
nRBC: 0.4 % — ABNORMAL HIGH (ref 0.0–0.2)

## 2023-06-29 LAB — TROPONIN I (HIGH SENSITIVITY)
Troponin I (High Sensitivity): 6 ng/L (ref <18)
Troponin I (High Sensitivity): 7 ng/L (ref <18)

## 2023-06-29 LAB — LIPASE, BLOOD: Lipase: 29 U/L (ref 11–51)

## 2023-06-29 NOTE — Discharge Instructions (Addendum)
 It was a pleasure caring for you today.  Results today were reassuring.  Please follow-up with your primary care provider.  Seek emergency care if experiencing any new or worsening symptoms.

## 2023-06-29 NOTE — Progress Notes (Signed)
 Patient had an acute episode of substernal chest pain and diaphoresis in clinic today.  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 EKG here was NSR but poor quality otherwise. -----------------------------------  Melvin Dawes, MD

## 2023-06-29 NOTE — ED Provider Triage Note (Signed)
 Emergency Medicine Provider Triage Evaluation Note  Melvin Hudson , a 68 y.o. male  was evaluated in triage.  Pt complains of chest pain.  Started this morning while waiting at the cancer clinic.  It is in the center of the chest does radiate to the epigastric region.  Denies shortness of breath.  Review of Systems  Positive: See above Negative: See above  Physical Exam  BP 116/85 (BP Location: Right Arm)   Pulse 81   Temp 97.6 F (36.4 C) (Oral)   Resp 16   SpO2 96%  Gen:   Awake, no distress   Resp:  Normal effort  MSK:   Moves extremities without difficulty  Other:    Medical Decision Making  Medically screening exam initiated at 11:20 AM.  Appropriate orders placed.  Psalm Rosco was informed that the remainder of the evaluation will be completed by another provider, this initial triage assessment does not replace that evaluation, and the importance of remaining in the ED until their evaluation is complete.  Work up started   Janalee Mcmurray, PA-C 06/29/23 1121

## 2023-06-29 NOTE — ED Provider Notes (Signed)
 [place] EMERGENCY DEPARTMENT AT [facility] Provider Note   CSN: [id number] Arrival date & time: 06/29/23  1043     History  Chief Complaint  Patient presents with   Chest Pain    Cancer patient comes in today after tx c c/o centralized chest pain. Unable to describe pain other than "it hurts." No sob at this time     Melvin Hudson is a 68 y.o. male with PMHx HLD, OA, CHF, DM, HTN, esophageal cancer s/p PEG and currently undergoing chemoRT treatment who presents to ED concerned for episode of substernal chest pain earlier today. Patient stating that he just finished his chemoRT treatment and had walked down to his provider's office for a check-up. Patient sat down and shortly after started having this central chest pain and diaphoresis which lasted 20 min. Patient has not taken any medications for the pain today.    Denies fever, dyspnea, nausea, vomiting, diarrhea, dysuria, hematuria, hematochezia. Denies lightheadedness or LOC.     Chest Pain      Home Medications Prior to Admission medications   Medication Sig Start Date End Date Taking? Authorizing Provider  albuterol  (VENTOLIN  HFA) 108 (90 Base) MCG/ACT inhaler Inhale 2 puffs into the lungs every 4 (four) hours as needed for wheezing (Cough). 04/02/23   [provider]  allopurinol  (ZYLOPRIM ) 300 MG tablet Take 300 mg by mouth every other day.    [provider]  APIXABAN  (ELIQUIS ) VTE STARTER PACK (10MG  AND 5MG ) Take as directed on package: start with two-5mg  tablets twice daily for 7 days. On day 8, switch to one-5mg  tablet twice daily. 06/17/23   Hoyt Macleod, MD  atorvastatin  (LIPITOR) 40 MG tablet Take 1 tablet (40 mg total) by mouth at bedtime. Patient taking differently: Take 40 mg by mouth in the morning. 10/07/19 06/11/23  [name], [name], DO  bismuth subsalicylate (PEPTO BISMOL) 262 MG/15ML suspension Take 30 mLs by mouth every 6 (six) hours as needed for indigestion or diarrhea or loose  stools.    [provider]  Blood Glucose Monitoring Suppl (ONETOUCH VERIO FLEX SYSTEM) w/Device KIT USE TO CHECK BLOOD SUGAR TWICE DAILY 04/21/19   [provider]  calcium  carbonate (TUMS - DOSED IN MG ELEMENTAL CALCIUM ) 500 MG chewable tablet Chew 2 tablets (400 mg of elemental calcium  total) by mouth 3 (three) times daily with meals. Patient taking differently: Chew 400 mg of elemental calcium  by mouth daily as needed. 04/18/22   Cleven Dallas, DO  clonazePAM  (KLONOPIN ) 1 MG disintegrating tablet Take 1 mg by mouth daily. 03/10/22   [provider]  diphenhydrAMINE  (BENADRYL ) 25 MG tablet Take 1 tablet (25 mg total) by mouth every 6 (six) hours. Patient taking differently: Take 25 mg by mouth every 6 (six) hours as needed for allergies or sleep. 10/29/13   Trish Furl, MD  famotidine  (PEPCID ) 40 MG/5ML suspension Place 2.5 mLs (20 mg total) into feeding tube daily. 06/18/23   Hoyt Macleod, MD  gabapentin  (NEURONTIN ) 300 MG capsule Take 300 mg by mouth 3 (three) times daily.  01/19/18   [provider]  HYDROcodone -acetaminophen  (HYCET) 7.5-325 mg/15 ml solution Take 10 mLs by mouth 4 (four) times daily as needed for moderate pain (pain score 4-6). 06/26/23   Sharyon Deis, NP  hydrocortisone  (ANUSOL -HC) 2.5 % rectal cream Place 1 Application rectally daily as needed for hemorrhoids or anal itching. 04/29/23   [provider]  isosorbide -hydrALAZINE  (BIDIL ) 20-37.5 MG tablet Take 1 tablet by mouth in the  morning and at bedtime. 04/20/23   [name], [name], DO  lactose free nutrition (BOOST) LIQD Take 237 mLs by mouth daily.    [provider]  lidocaine  (XYLOCAINE ) 2 % solution Patient: Mix 1part 2% viscous lidocaine , 1part H20. Swallow 10mL of diluted mixture, before meals and at bedtime, up to QID 06/08/23   Colie Dawes, MD  LORazepam  (ATIVAN ) 0.5 MG tablet Take 1 tablet PRN claustrophobia 20 minutes prior to radiation. Will need a driver. Use only  as needed. 06/19/23   Colie Dawes, MD  omeprazole  (PRILOSEC) 20 MG capsule Take 20 mg by mouth daily.  06/04/16   [provider]  ondansetron  (ZOFRAN ) 8 MG tablet Take 1 tablet (8 mg total) by mouth every 8 (eight) hours as needed for nausea or vomiting. Start on the third day after chemotherapy. 06/17/23   Sonja Philomath, MD  [facility] VERIO test strip USE TO CHECK BLOOD SUGAR TWICE DAILY 07/20/19   [provider]  polyethylene glycol (MIRALAX  / GLYCOLAX ) 17 g packet Take 17 g by mouth daily. Patient taking differently: Take 17 g by mouth daily as needed. 04/19/22   Cleven Dallas, DO  prochlorperazine  (COMPAZINE ) 10 MG tablet Take 1 tablet (10 mg total) by mouth every 6 (six) hours as needed for nausea or vomiting. 06/17/23   Sonja Brule, MD  Protein (FEEDING SUPPLEMENT, PROSOURCE TF20,) liquid Place 60 mLs into feeding tube 2 (two) times daily. 06/17/23   Dahal, Aminta Baldy, MD  psyllium (HYDROCIL/METAMUCIL) 95 % PACK Take 1 packet by mouth daily. Patient taking differently: Take 1 packet by mouth daily as needed for mild constipation or moderate constipation. 04/15/22   Cleven Dallas, DO  senna-docusate (SENOKOT-S) 8.6-50 MG tablet Take 1 tablet by mouth 2 (two) times daily. Patient taking differently: Take 1 tablet by mouth daily as needed for mild constipation or moderate constipation. 04/18/22   Cleven Dallas, DO  Water  For Irrigation, Sterile (FREE WATER ) SOLN Place 100 mLs into feeding tube every 6 (six) hours. 06/17/23   Hoyt Macleod, MD      Allergies    Azilsartan and Advil [ibuprofen]    Review of Systems   Review of Systems  Cardiovascular:  Positive for chest pain.    Physical Exam Updated Vital Signs BP 130/88   Pulse 73   Temp 97.9 F (36.6 C) (Oral)   Resp 20   SpO2 100%  Physical Exam Vitals and nursing note reviewed.  Constitutional:      General: He is not in acute distress.    Appearance: He is not ill-appearing, toxic-appearing or diaphoretic.  HENT:      Head: Normocephalic and atraumatic.     Mouth/Throat:     Mouth: Mucous membranes are moist.     Pharynx: No oropharyngeal exudate or posterior oropharyngeal erythema.  Eyes:     General: No scleral icterus.       Right eye: No discharge.        Left eye: No discharge.     Conjunctiva/sclera: Conjunctivae normal.  Cardiovascular:     Rate and Rhythm: Normal rate and regular rhythm.     Pulses: Normal pulses.     Heart sounds: Normal heart sounds. No murmur heard. Pulmonary:     Effort: Pulmonary effort is normal. No respiratory distress.     Breath sounds: Normal breath sounds. No wheezing, rhonchi or rales.  Abdominal:     Tenderness: There is no abdominal tenderness.     Comments: PEG tube intact without surrounding  erythema or swelling.  Musculoskeletal:     Right lower leg: No edema.     Left lower leg: No edema.  Skin:    General: Skin is warm and dry.     Findings: No rash.  Neurological:     General: No focal deficit present.     Mental Status: He is alert and oriented to person, place, and time. Mental status is at baseline.  Psychiatric:        Mood and Affect: Mood normal.        Behavior: Behavior normal.     ED Results / Procedures / Treatments   Labs (all labs ordered are listed, but only abnormal results are displayed) Labs Reviewed  CBC - Abnormal; Notable for the following components:      Result Value   Hemoglobin 12.9 (*)    HCT 38.7 (*)    RDW 18.6 (*)    Platelets 428 (*)    nRBC 0.4 (*)    All other components within normal limits  COMPREHENSIVE METABOLIC PANEL WITH GFR - Abnormal; Notable for the following components:   Glucose, Bld 105 (*)    Creatinine, Ser 0.58 (*)    Total Bilirubin 1.6 (*)    All other components within normal limits  URINALYSIS, ROUTINE W REFLEX MICROSCOPIC - Abnormal; Notable for the following components:   Specific Gravity, Urine 1.032 (*)    Glucose, UA >=500 (*)    Hgb urine dipstick SMALL (*)    Ketones, ur 5 (*)     All other components within normal limits  LIPASE, BLOOD  TROPONIN I (HIGH SENSITIVITY)  TROPONIN I (HIGH SENSITIVITY)    EKG EKG Interpretation Date/Time:  Monday Jun 29 2023 11:02:14 EDT Ventricular Rate:  81 PR Interval:  188 QRS Duration:  146 QT Interval:  432 QTC Calculation: 501 R Axis:   -83  Text Interpretation: Normal sinus rhythm Right bundle branch block Left anterior fascicular block Bifascicular block Confirmed by Jerilynn Montenegro [phone]) on 06/29/2023 5:03:20 PM  Radiology DG Chest Port 1 View Result Date: 06/29/2023 CLINICAL DATA:  Chest pain. EXAM: PORTABLE CHEST 1 VIEW COMPARISON:  PET-CT dated 06/11/2023. FINDINGS: Low lung volumes. Mild bibasilar atelectasis. Known hypermetabolic circumferential esophageal mass is not well appreciated on this exam and better evaluated on the prior PET-CT dated 06/11/2023. No focal consolidation, sizeable pleural effusion, or pneumothorax. No acute osseous abnormality. IMPRESSION: 1. Low lung volumes.  Mild bibasilar atelectasis. 2. Known hypermetabolic circumferential esophageal mass is not well appreciated on this exam and better evaluated on the prior PET-CT dated 06/11/2023. Electronically Signed   By: Mannie Seek M.D.   On: 06/29/2023 12:54    Procedures Procedures    Medications Ordered in ED Medications - No data to display  ED Course/ Medical Decision Making/ A&P                                 Medical Decision Making  This patient presents to the ED for concern of chest pain, this involves an extensive number of treatment options, and is a complaint that carries with it a high risk of complications and morbidity.  The differential diagnosis includes acute coronary syndrome, congestive heart failure, pericarditis, pneumonia, pulmonary embolism, tension pneumothorax, esophageal rupture, aortic dissection, cardiac tamponade, musculoskeletal   Co morbidities that complicate the patient evaluation  HLD, OA, CHF,  DM, HTN, esophageal cancer s/p PEG and currently undergoing radiation  treatment   Additional history obtained:  Additional history obtained from 5/12 Oncology note: Patient had an acute episode of substernal chest pain and diaphoresis in clinic today. 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 EKG here was NSR but poor quality otherwise.    Problem List / ED Course / Critical interventions / Medication management  Patient presented for chest pain. This episode of chest pain lasted 20 min. Patient denies any other recent episodes of chest pain.  Patient denies any recent infectious symptoms.  Physical exam reassuring.  Patient afebrile with stable vitals. I Ordered, and personally interpreted labs.  Lipase within normal limits.  CBC without leukocytosis.  There is mild anemia with hemoglobin at 12.9 today.  CMP reassuring.  Initial and repeat troponin within normal limits. UA not concerning for infection. The patient was maintained on a cardiac monitor.  I personally viewed and interpreted the EKG/cardiac monitored which showed an underlying rhythm of: Sinus rhythm-no acute changes. I ordered imaging studies including chest xray to assess for process contributing to patient's symptoms. I independently visualized and interpreted imaging which showed no acute cardiopulmonary disease. I agree with the radiologist interpretation. Shared all results with patient. Answered all questions. It appears that patient's chest pain was associated with his esophageal tumor as suspected by outpatient oncologist. Patient has been asymptomatic since this appointment earlier today. He still feels good and is ready to go home.  Staffed with Dr. Aldean Amass who agrees with plan.  I have reviewed the patients home medicines and have made adjustments as needed The patient has been appropriately medically screened and/or stabilized in the ED. I have low suspicion for any other  emergent medical condition which would require further screening, evaluation or treatment in the ED or require inpatient management. At time of discharge the patient is hemodynamically stable and in no acute distress. I have discussed work-up results and diagnosis with patient and answered all questions. Patient is agreeable with discharge plan. We discussed strict return precautions for returning to the emergency department and they verbalized understanding.     Social Determinants of Health:  none         Final Clinical Impression(s) / ED Diagnoses Final diagnoses:  Nonspecific chest pain    Rx / DC Orders ED Discharge Orders     None         Don Fritter 06/29/23 2040    Jerilynn Montenegro, MD 06/29/23 2330

## 2023-06-30 ENCOUNTER — Ambulatory Visit
Admission: RE | Admit: 2023-06-30 | Discharge: 2023-06-30 | Disposition: A | Discharge status: Home / Self Care | Source: Ambulatory Visit | Attending: Radiation Oncology

## 2023-06-30 ENCOUNTER — Other Ambulatory Visit: Payer: Self-pay

## 2023-06-30 ENCOUNTER — Telehealth: Payer: Self-pay | Admitting: Licensed Clinical Social Worker

## 2023-06-30 ENCOUNTER — Ambulatory Visit (HOSPITAL_COMMUNITY)
Admission: RE | Admit: 2023-06-30 | Discharge: 2023-06-30 | Disposition: A | Discharge status: Home / Self Care | Source: Ambulatory Visit | Attending: Hematology | Admitting: Hematology

## 2023-06-30 DIAGNOSIS — C159 Malignant neoplasm of esophagus, unspecified: Secondary | ICD-10-CM | POA: Diagnosis present

## 2023-06-30 DIAGNOSIS — Z510 Encounter for antineoplastic radiation therapy: Secondary | ICD-10-CM | POA: Diagnosis not present

## 2023-06-30 DIAGNOSIS — C153 Malignant neoplasm of upper third of esophagus: Secondary | ICD-10-CM

## 2023-06-30 LAB — RAD ONC ARIA SESSION SUMMARY
Course Elapsed Days: 6
Plan Fractions Treated to Date: 5
Plan Prescribed Dose Per Fraction: 1.8 Gy
Plan Total Fractions Prescribed: 25
Plan Total Prescribed Dose: 45 Gy
Reference Point Dosage Given to Date: 9 Gy
Reference Point Session Dosage Given: 1.8 Gy
Session Number: 5

## 2023-06-30 MED ORDER — HEPARIN SOD (PORK) LOCK FLUSH 100 UNIT/ML IV SOLN
INTRAVENOUS | Status: AC
  Filled 2023-06-30: qty 5

## 2023-06-30 MED ORDER — LIDOCAINE-EPINEPHRINE 1 %-1:100000 IJ SOLN
20.0000 mL | Freq: Once | INTRAMUSCULAR | Status: AC
  Administered 2023-06-30: 3 mL via INTRADERMAL

## 2023-06-30 MED ORDER — LIDOCAINE HCL 1 % IJ SOLN
INTRAMUSCULAR | Status: AC
  Filled 2023-06-30: qty 20

## 2023-06-30 MED ORDER — HEPARIN SOD (PORK) LOCK FLUSH 100 UNIT/ML IV SOLN
500.0000 [IU] | Freq: Once | INTRAVENOUS | Status: AC
  Administered 2023-06-30: 500 [IU] via INTRAVENOUS

## 2023-06-30 NOTE — Progress Notes (Unsigned)
 [facility]     [phone] [phone]    Patient Care Team: Lorella Roles, MD as PCP - General (Family Medicine) Olinda Bertrand, DO as PCP - Cardiology (Cardiology) Clance, Deena Farrier, MD as Referring Physician (Pulmonary Disease) Almeda Jacobs, MD as Consulting Physician (Hematology and Oncology) Colie Dawes, MD as Attending Physician (Radiation Oncology) Sonja Lakehills, MD as Consulting Physician (Hematology and Oncology)   CHIEF COMPLAINT: Follow up esophageal cancer   Oncology History  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HCC)  06/14/2013 Procedure   Biopsy of the tonsil confirmrf squamous cell carcinoma. 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 Bacani returns for follow up and treatment as scheduled. 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     Past Surgical History:  Procedure Laterality Date   COLONOSCOPY Left 04/11/2014   Procedure: COLONOSCOPY;  Surgeon: Evangeline Hilts, MD;  Location: [facility] ENDOSCOPY;  Service: Endoscopy;  Laterality: Left;   COLONOSCOPY N/A 04/20/2014   Procedure: COLONOSCOPY;  Surgeon: Yvetta Herbert, MD;  Location: [facility] ENDOSCOPY;  Service: Endoscopy;  Laterality: N/A;   COLONOSCOPY W/ BIOPSIES AND POLYPECTOMY     benign   DIRECT LARYNGOSCOPY  01/05/2014   DIRECT LARYNGOSCOPY N/A 01/05/2014   Procedure: DIRECT LARYNGOSCOPY AND BIOPSY;  Surgeon: Janita Mellow, MD;  Location: [facility] OR;  Service: ENT;  Laterality: N/A;   ESOPHAGOSCOPY WITH DILITATION N/A 05/27/2023   Procedure: ESOPHAGOSCOPY;  Surgeon: Janita Mellow, MD;  Location: [facility] OR;  Service: ENT;  Laterality: N/A;   IR GASTROSTOMY TUBE MOD SED  06/12/2023   KNEE ARTHROSCOPY Right ~ 1994   LEFT AND RIGHT HEART CATHETERIZATION WITH CORONARY/GRAFT ANGIOGRAM N/A 12/31/2010   Procedure: LEFT AND RIGHT HEART CATHETERIZATION WITH Estella Helling;  Surgeon: Jessica Morn, MD;  Location: [facility] CATH LAB;  Service: Cardiovascular;  Laterality: N/A;   MULTIPLE EXTRACTIONS WITH ALVEOLOPLASTY N/A 07/08/2013   Procedure: Extraction of tooth #'s 22, 27 with alveoloplasty and bilateal mandibular facial exostoses reductions;  Surgeon: Carol Chroman, DDS;  Location:  [facility] ORS;  Service: Oral Surgery;  Laterality: N/A;   MULTIPLE TOOTH EXTRACTIONS  ~ 2008     Outpatient Encounter Medications as of 07/01/2023  Medication Sig Note   albuterol  (VENTOLIN  HFA) 108 (90 Base) MCG/ACT inhaler Inhale 2 puffs into the lungs every 4 (four) hours as needed for wheezing (Cough).    allopurinol  (ZYLOPRIM ) 300 MG tablet Take 300 mg by mouth every other day.    APIXABAN  (ELIQUIS ) VTE STARTER PACK  (10MG  AND 5MG ) Take as directed on package: start with two-5mg  tablets twice daily for 7 days. On day 8, switch to one-5mg  tablet twice daily.    atorvastatin  (LIPITOR) 40 MG tablet Take 1 tablet (40 mg total) by mouth at bedtime. (Patient taking differently: Take 40 mg by mouth in the morning.)    bismuth subsalicylate (PEPTO BISMOL) 262 MG/15ML suspension Take 30 mLs by mouth every 6 (six) hours as needed for indigestion or diarrhea or loose stools.    Blood Glucose Monitoring Suppl (ONETOUCH VERIO FLEX SYSTEM) w/Device KIT USE TO CHECK BLOOD SUGAR TWICE DAILY    calcium  carbonate (TUMS - DOSED IN MG ELEMENTAL CALCIUM ) 500 MG chewable tablet Chew 2 tablets (400 mg of elemental calcium  total) by mouth 3 (three) times daily with meals. (Patient taking differently: Chew 400 mg of elemental calcium  by mouth daily as needed.)    clonazePAM  (KLONOPIN ) 1 MG disintegrating tablet Take 1 mg by mouth daily.    diphenhydrAMINE  (BENADRYL ) 25 MG tablet Take 1 tablet (25 mg total) by mouth every 6 (six) hours. (Patient taking differently: Take 25 mg by mouth every 6 (six) hours as needed for allergies or sleep.)    famotidine  (PEPCID ) 40 MG/5ML suspension Place 2.5 mLs (20 mg total) into feeding tube daily.    gabapentin  (NEURONTIN ) 300 MG capsule Take 300 mg by mouth 3 (three) times daily.     HYDROcodone -acetaminophen  (HYCET) 7.5-325 mg/15 ml solution Take 10 mLs by mouth 4 (four) times daily as needed for moderate pain (pain score 4-6).    hydrocortisone  (ANUSOL -HC) 2.5 % rectal cream Place 1 Application rectally daily as needed for hemorrhoids or anal itching.    isosorbide -hydrALAZINE  (BIDIL ) 20-37.5 MG tablet Take 1 tablet by mouth in the morning and at bedtime.    lactose free nutrition (BOOST) LIQD Take 237 mLs by mouth daily. 06/11/2023: Patient tries to drink if he can keep it down.   lidocaine  (XYLOCAINE ) 2 % solution Patient: Mix 1part 2% viscous lidocaine , 1part H20. Swallow 10mL of diluted mixture,  before meals and at bedtime, up to QID    LORazepam  (ATIVAN ) 0.5 MG tablet Take 1 tablet PRN claustrophobia 20 minutes prior to radiation. Will need a driver. Use only as needed.    omeprazole  (PRILOSEC) 20 MG capsule Take 20 mg by mouth daily.     ondansetron  (ZOFRAN ) 8 MG tablet Take 1 tablet (8 mg total) by mouth every 8 (eight) hours as needed for nausea or vomiting. Start on the third day after chemotherapy.    ONETOUCH VERIO test strip USE TO CHECK BLOOD SUGAR TWICE DAILY    polyethylene glycol (MIRALAX  / GLYCOLAX ) 17 g packet Take 17 g by mouth daily. (Patient taking differently: Take 17 g by mouth daily as needed.)    prochlorperazine  (COMPAZINE ) 10 MG tablet Take 1 tablet (10 mg total) by mouth every 6 (six) hours as needed for nausea or vomiting.    Protein (FEEDING SUPPLEMENT, PROSOURCE TF20,) liquid Place 60 mLs into feeding tube 2 (two)  times daily.    psyllium (HYDROCIL/METAMUCIL) 95 % PACK Take 1 packet by mouth daily. (Patient taking differently: Take 1 packet by mouth daily as needed for mild constipation or moderate constipation.)    senna-docusate (SENOKOT-S) 8.6-50 MG tablet Take 1 tablet by mouth 2 (two) times daily. (Patient taking differently: Take 1 tablet by mouth daily as needed for mild constipation or moderate constipation.)    Water  For Irrigation, Sterile (FREE WATER ) SOLN Place 100 mLs into feeding tube every 6 (six) hours.    No facility-administered encounter medications on file as of 07/01/2023.     There were no vitals filed for this visit. There is no height or weight on file to calculate BMI.   ECOG PERFORMANCE STATUS: {CHL ONC ECOG PS:[id number]}  PHYSICAL EXAM GENERAL:alert, no distress and comfortable SKIN: no rash  EYES: sclera clear NECK: without mass LYMPH:  no palpable cervical or supraclavicular lymphadenopathy  LUNGS: clear with normal breathing effort HEART: regular rate & rhythm, no lower extremity edema ABDOMEN: abdomen soft,  non-tender and normal bowel sounds NEURO: alert & oriented x 3 with fluent speech, no focal motor/sensory deficits Breast exam:  PAC without erythema    CBC    Latest Ref Rng & Units 06/29/2023   11:20 AM 06/24/2023    9:20 AM 06/12/2023    5:05 AM  CBC  WBC 4.0 - 10.5 K/uL 7.9  9.4  7.9   Hemoglobin 13.0 - 17.0 g/dL 40.9  81.1  91.4   Hematocrit 39.0 - 52.0 % 38.7  39.2  39.2   Platelets 150 - 400 K/uL 428  425  269       CMP     Latest Ref Rng & Units 06/29/2023   11:20 AM 06/24/2023    9:20 AM 06/16/2023    5:36 AM  CMP  Glucose 70 - 99 mg/dL 782  956  213   BUN 8 - 23 mg/dL 16  14  21    Creatinine 0.61 - 1.24 mg/dL 0.86  5.78  4.69   Sodium 135 - 145 mmol/L 136  138  141   Potassium 3.5 - 5.1 mmol/L 4.6  4.2  3.7   Chloride 98 - 111 mmol/L 98  100  103   CO2 22 - 32 mmol/L 28  31  26    Calcium  8.9 - 10.3 mg/dL 8.9  9.4  8.6   Total Protein 6.5 - 8.1 g/dL 7.3  7.6    Total Bilirubin 0.0 - 1.2 mg/dL 1.6  0.9    Alkaline Phos 38 - 126 U/L 114  127    AST 15 - 41 U/L 24  22    ALT 0 - 44 U/L 19  24        ASSESSMENT & PLAN:  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 -EGD on 05/27/2023 which showed a mass in the proximal esophagus, 2 to 3 cm below introitus.  Biopsy confirmed invasive squamous cell carcinoma.  -PET scan showed hypermetabolic lesion in the proximal esophagus, with a positive peritracheal lymph node.  No distant metastasis -Status post PEG feeding tube placement  -plan to start chemoRT on 06/24/2023    PLAN:  No orders of the defined types were placed in this encounter.     All questions were answered. The patient knows to call the clinic with any problems, questions or concerns. No barriers to learning were detected. I spent *** counseling the patient face  to face. The total time spent in the appointment was *** and more than 50% was on counseling, review of test results, and  coordination of care.   Ryaan Vanwagoner K Mayu Ronk, NP 06/30/2023 8:49 PM

## 2023-06-30 NOTE — Procedures (Signed)
 Right double-lumen brachial vein PICC placed.  Length 40 cm.  Tip SVC/right atrial junction.  Medication used-  1% lidocaine  to skin and subcutaneous tissue.  EBL less than 2 cc.  Okay to use.

## 2023-06-30 NOTE — Telephone Encounter (Signed)
 CSW received a voicemail from pt inquiring about getting a gas card. CSW attempted to call pt back w/ no answer. Voicemail left for pt w/ contact information for Roselyn Connor to apply for the [employer].

## 2023-07-01 ENCOUNTER — Inpatient Hospital Stay: Admitting: Dietician

## 2023-07-01 ENCOUNTER — Inpatient Hospital Stay

## 2023-07-01 ENCOUNTER — Other Ambulatory Visit: Payer: Self-pay

## 2023-07-01 ENCOUNTER — Ambulatory Visit
Admission: RE | Admit: 2023-07-01 | Discharge: 2023-07-01 | Disposition: A | Discharge status: Home / Self Care | Source: Ambulatory Visit | Attending: Radiation Oncology

## 2023-07-01 ENCOUNTER — Inpatient Hospital Stay: Admitting: Licensed Clinical Social Worker

## 2023-07-01 ENCOUNTER — Inpatient Hospital Stay (HOSPITAL_BASED_OUTPATIENT_CLINIC_OR_DEPARTMENT_OTHER): Admitting: Nurse Practitioner

## 2023-07-01 ENCOUNTER — Encounter: Payer: Self-pay | Admitting: Nurse Practitioner

## 2023-07-01 VITALS — BP 115/84 | HR 100 | Temp 97.0°F | Resp 17 | Wt 239.9 lb

## 2023-07-01 DIAGNOSIS — C153 Malignant neoplasm of upper third of esophagus: Secondary | ICD-10-CM

## 2023-07-01 DIAGNOSIS — Z510 Encounter for antineoplastic radiation therapy: Secondary | ICD-10-CM | POA: Diagnosis not present

## 2023-07-01 DIAGNOSIS — C099 Malignant neoplasm of tonsil, unspecified: Secondary | ICD-10-CM

## 2023-07-01 LAB — RAD ONC ARIA SESSION SUMMARY
Course Elapsed Days: 7
Plan Fractions Treated to Date: 6
Plan Prescribed Dose Per Fraction: 1.8 Gy
Plan Total Fractions Prescribed: 25
Plan Total Prescribed Dose: 45 Gy
Reference Point Dosage Given to Date: 10.8 Gy
Reference Point Session Dosage Given: 1.8 Gy
Session Number: 6

## 2023-07-01 LAB — CMP (CANCER CENTER ONLY)
ALT: 16 U/L (ref 0–44)
AST: 17 U/L (ref 15–41)
Albumin: 4.1 g/dL (ref 3.5–5.0)
Alkaline Phosphatase: 125 U/L (ref 38–126)
Anion gap: 6 (ref 5–15)
BUN: 17 mg/dL (ref 8–23)
CO2: 29 mmol/L (ref 22–32)
Calcium: 9.4 mg/dL (ref 8.9–10.3)
Chloride: 102 mmol/L (ref 98–111)
Creatinine: 0.85 mg/dL (ref 0.61–1.24)
GFR, Estimated: >60 mL/min (ref >60)
Glucose, Bld: 138 mg/dL — ABNORMAL HIGH (ref 70–99)
Potassium: 4 mmol/L (ref 3.5–5.1)
Sodium: 137 mmol/L (ref 135–145)
Total Bilirubin: 1.1 mg/dL (ref 0.0–1.2)
Total Protein: 7.6 g/dL (ref 6.5–8.1)

## 2023-07-01 LAB — CBC WITH DIFFERENTIAL (CANCER CENTER ONLY)
Abs Immature Granulocytes: 0.15 10*3/uL — ABNORMAL HIGH (ref 0.00–0.07)
Basophils Absolute: 0.1 10*3/uL (ref 0.0–0.1)
Basophils Relative: 1 %
Eosinophils Absolute: 0.4 10*3/uL (ref 0.0–0.5)
Eosinophils Relative: 5 %
HCT: 37.3 % — ABNORMAL LOW (ref 39.0–52.0)
Hemoglobin: 13.1 g/dL (ref 13.0–17.0)
Immature Granulocytes: 2 %
Lymphocytes Relative: 15 %
Lymphs Abs: 1.2 10*3/uL (ref 0.7–4.0)
MCH: 29 pg (ref 26.0–34.0)
MCHC: 35.1 g/dL (ref 30.0–36.0)
MCV: 82.5 fL (ref 80.0–100.0)
Monocytes Absolute: 0.5 10*3/uL (ref 0.1–1.0)
Monocytes Relative: 7 %
Neutro Abs: 5.8 10*3/uL (ref 1.7–7.7)
Neutrophils Relative %: 70 %
Platelet Count: 475 10*3/uL — ABNORMAL HIGH (ref 150–400)
RBC: 4.52 MIL/uL (ref 4.22–5.81)
RDW: 17.9 % — ABNORMAL HIGH (ref 11.5–15.5)
WBC Count: 8.1 10*3/uL (ref 4.0–10.5)
nRBC: 0.7 % — ABNORMAL HIGH (ref 0.0–0.2)

## 2023-07-01 LAB — MAGNESIUM: Magnesium: 2.2 mg/dL (ref 1.7–2.4)

## 2023-07-01 LAB — TSH: TSH: 0.992 u[IU]/mL (ref 0.350–4.500)

## 2023-07-01 MED ORDER — SODIUM CHLORIDE 0.9 % IV SOLN: INTRAVENOUS | Status: DC

## 2023-07-01 MED ORDER — PALONOSETRON HCL INJECTION 0.25 MG/5ML
0.2500 mg | Freq: Once | INTRAVENOUS | Status: AC
  Administered 2023-07-01: 0.25 mg via INTRAVENOUS
  Filled 2023-07-01: qty 5

## 2023-07-01 MED ORDER — APIXABAN 5 MG PO TABS: 5.0000 mg | ORAL_TABLET | Freq: Two times a day (BID) | ORAL | 1 refills | Status: DC

## 2023-07-01 MED ORDER — DIPHENHYDRAMINE HCL 50 MG/ML IJ SOLN
25.0000 mg | Freq: Once | INTRAMUSCULAR | Status: AC
  Administered 2023-07-01: 25 mg via INTRAVENOUS
  Filled 2023-07-01: qty 1

## 2023-07-01 MED ORDER — SODIUM CHLORIDE 0.9 % IV SOLN
275.8000 mg | Freq: Once | INTRAVENOUS | Status: AC
  Administered 2023-07-01: 280 mg via INTRAVENOUS
  Filled 2023-07-01: qty 28

## 2023-07-01 MED ORDER — DEXAMETHASONE SODIUM PHOSPHATE 10 MG/ML IJ SOLN
10.0000 mg | Freq: Once | INTRAMUSCULAR | Status: AC
  Administered 2023-07-01: 10 mg via INTRAVENOUS
  Filled 2023-07-01: qty 1

## 2023-07-01 MED ORDER — SODIUM CHLORIDE 0.9 % IV SOLN
50.0000 mg/m2 | Freq: Once | INTRAVENOUS | Status: AC
  Administered 2023-07-01: 120 mg via INTRAVENOUS
  Filled 2023-07-01: qty 20

## 2023-07-01 MED ORDER — FAMOTIDINE IN NACL 20-0.9 MG/50ML-% IV SOLN
20.0000 mg | Freq: Once | INTRAVENOUS | Status: AC
  Administered 2023-07-01: 20 mg via INTRAVENOUS
  Filled 2023-07-01: qty 50

## 2023-07-01 NOTE — Patient Instructions (Signed)
 [facility] - A DEPT OF [facility]  Discharge Instructions: Thank you for choosing [facility] to provide your oncology and hematology care.   If you have a lab appointment with the Cancer Center, please go directly to the Cancer Center and check in at the registration area.   Wear comfortable clothing and clothing appropriate for easy access to any Portacath or PICC line.   We strive to give you quality time with your provider. You may need to reschedule your appointment if you arrive late (15 or more minutes).  Arriving late affects you and other patients whose appointments are after yours.  Also, if you miss three or more appointments without notifying the office, you may be dismissed from the clinic at the provider's discretion.      For prescription refill requests, have your pharmacy contact our office and allow 72 hours for refills to be completed.    Today you received the following chemotherapy and/or immunotherapy agents paclitaxel, carboplatin      To help prevent nausea and vomiting after your treatment, we encourage you to take your nausea medication as directed.  BELOW ARE SYMPTOMS THAT SHOULD BE REPORTED IMMEDIATELY: *FEVER GREATER THAN 100.4 F (38 C) OR HIGHER *CHILLS OR SWEATING *NAUSEA AND VOMITING THAT IS NOT CONTROLLED WITH YOUR NAUSEA MEDICATION *UNUSUAL SHORTNESS OF BREATH *UNUSUAL BRUISING OR BLEEDING *URINARY PROBLEMS (pain or burning when urinating, or frequent urination) *BOWEL PROBLEMS (unusual diarrhea, constipation, pain near the anus) TENDERNESS IN MOUTH AND THROAT WITH OR WITHOUT PRESENCE OF ULCERS (sore throat, sores in mouth, or a toothache) UNUSUAL RASH, SWELLING OR PAIN  UNUSUAL VAGINAL DISCHARGE OR ITCHING   Items with * indicate a potential emergency and should be followed up as soon as possible or go to the Emergency Department if any problems should occur.  Please show the CHEMOTHERAPY ALERT CARD or  IMMUNOTHERAPY ALERT CARD at check-in to the Emergency Department and triage nurse.  Should you have questions after your visit or need to cancel or reschedule your appointment, please contact [facility] - A DEPT OF [facility]  Dept: [phone]  and follow the prompts.  Office hours are 8:00 a.m. to 4:30 p.m. Monday - Friday. Please note that voicemails left after 4:00 p.m. may not be returned until the following business day.  We are closed weekends and major holidays. You have access to a nurse at all times for urgent questions. Please call the main number to the clinic Dept: [phone] and follow the prompts.   For any non-urgent questions, you may also contact your provider using MyChart. We now offer e-Visits for anyone 44 and older to request care online for non-urgent symptoms. For details visit [email or web address].   Also download the MyChart app! Go to the app store, search "MyChart", open the app, select [facility], and log in with your MyChart username and password.

## 2023-07-01 NOTE — Progress Notes (Signed)
 [facility] CSW Progress Note  Visual merchandiser met with pt in infusion to answer questions regarding obtaining a gas card.  CSW provided pt w/ a business card for [employer] and explained the process to apply for the [employer] on the back of Shauna's card and instructed pt to call her to set up an appointment.  CSW to continue to follow as appropriate to provide support.        Quenton Bruns, LCSW Clinical Social Worker [facility]

## 2023-07-01 NOTE — Progress Notes (Signed)
 Nutrition Follow-up:   Patient with esophageal cancer. Patient receiving concurrent chemoradiation therapy with weekly carboplatin  + paclitaxel  (first chemo 5/7).   S/p PEG 4/24 during hospital admission DME: [employer]  Met with patient in infusion. He reports doing okay overall other than wt loss. Patient reports no intake for a day when seen in ED on 5/12. He feels this is why he lost. Patient reports giving 5-6 cartons of Nutren 1.5. Says he sometimes belches up feedings after bolus. Patient denies nausea, vomiting, constipation. He reports episode of diarrhea yesterday. This has resolved.    Medications: reviewed   Labs: glucose 138  Anthropometrics: Wt 239 lb 14.4 oz today - decreased 3% in 7 days which is severe for time frame  5/7 - 246 lb 12 oz 5/2 - 250 lb 6.4 oz 4/27 - 252 lb 6.8 oz   Estimated Energy Needs  Kcals: 2200-2400 Protein: 100-120 Fluid: >2.2 L  NUTRITION DIAGNOSIS: Food and nutrition related knowledge deficit ongoing   INTERVENTION:  Encourage meeting nutrition goals daily to minimize wt loss 6 cartons Nutren 1.5 split over 5 feedings. Flush with 60 ml water  before and after each bolus. Provide additional 240 ml [facility] BID HH RN assisting with enteral needs at home   MONITORING, EVALUATION, GOAL: wt trends, TF   NEXT VISIT: Wednesday May 21 during infusion

## 2023-07-02 ENCOUNTER — Ambulatory Visit
Admission: RE | Admit: 2023-07-02 | Discharge: 2023-07-02 | Disposition: A | Discharge status: Home / Self Care | Source: Ambulatory Visit | Attending: Radiation Oncology

## 2023-07-02 ENCOUNTER — Other Ambulatory Visit: Payer: Self-pay

## 2023-07-02 ENCOUNTER — Encounter: Payer: Self-pay | Admitting: Hematology

## 2023-07-02 DIAGNOSIS — Z510 Encounter for antineoplastic radiation therapy: Secondary | ICD-10-CM | POA: Diagnosis not present

## 2023-07-02 LAB — RAD ONC ARIA SESSION SUMMARY
Course Elapsed Days: 8
Plan Fractions Treated to Date: 7
Plan Prescribed Dose Per Fraction: 1.8 Gy
Plan Total Fractions Prescribed: 25
Plan Total Prescribed Dose: 45 Gy
Reference Point Dosage Given to Date: 12.6 Gy
Reference Point Session Dosage Given: 1.8 Gy
Session Number: 7

## 2023-07-02 NOTE — Progress Notes (Signed)
 Patient called to discuss how [employer] process works. Advised what is needed to apply and when he has information to provide to check in staff whom will scan and email to me and give him grant paperwork to complete. He verbalized understanding.  He has my card for any additional financial questions or concerns.

## 2023-07-03 ENCOUNTER — Ambulatory Visit
Admission: RE | Admit: 2023-07-03 | Discharge: 2023-07-03 | Disposition: A | Discharge status: Home / Self Care | Source: Ambulatory Visit | Attending: Radiation Oncology | Admitting: Radiation Oncology

## 2023-07-03 ENCOUNTER — Ambulatory Visit

## 2023-07-03 ENCOUNTER — Other Ambulatory Visit: Payer: Self-pay

## 2023-07-03 DIAGNOSIS — Z510 Encounter for antineoplastic radiation therapy: Secondary | ICD-10-CM | POA: Diagnosis not present

## 2023-07-03 LAB — RAD ONC ARIA SESSION SUMMARY
Course Elapsed Days: 9
Plan Fractions Treated to Date: 8
Plan Prescribed Dose Per Fraction: 1.8 Gy
Plan Total Fractions Prescribed: 25
Plan Total Prescribed Dose: 45 Gy
Reference Point Dosage Given to Date: 14.4 Gy
Reference Point Session Dosage Given: 1.8 Gy
Session Number: 8

## 2023-07-04 ENCOUNTER — Other Ambulatory Visit: Payer: Self-pay

## 2023-07-06 ENCOUNTER — Ambulatory Visit
Admission: RE | Admit: 2023-07-06 | Discharge: 2023-07-06 | Disposition: A | Discharge status: Home / Self Care | Source: Ambulatory Visit | Attending: Radiation Oncology | Admitting: Radiation Oncology

## 2023-07-06 ENCOUNTER — Encounter: Payer: Self-pay | Admitting: Hematology

## 2023-07-06 ENCOUNTER — Other Ambulatory Visit: Payer: Self-pay

## 2023-07-06 DIAGNOSIS — Z510 Encounter for antineoplastic radiation therapy: Secondary | ICD-10-CM | POA: Diagnosis not present

## 2023-07-06 DIAGNOSIS — C153 Malignant neoplasm of upper third of esophagus: Secondary | ICD-10-CM

## 2023-07-06 LAB — RAD ONC ARIA SESSION SUMMARY
Course Elapsed Days: 12
Plan Fractions Treated to Date: 9
Plan Prescribed Dose Per Fraction: 1.8 Gy
Plan Total Fractions Prescribed: 25
Plan Total Prescribed Dose: 45 Gy
Reference Point Dosage Given to Date: 16.2 Gy
Reference Point Session Dosage Given: 1.8 Gy
Session Number: 9

## 2023-07-06 MED ORDER — SONAFINE EX EMUL
1.0000 | Freq: Two times a day (BID) | CUTANEOUS | Status: DC
  Administered 2023-07-06: 1 via TOPICAL

## 2023-07-06 NOTE — Progress Notes (Signed)
 Patient provided documents and signed grant paperwork today. He received a copy of the approval letter and expense sheet to review and call me at his earliest convenience to discuss. He received a gas card today from his grant.  He has my card to do so and for any additional financial questions or concerns.

## 2023-07-07 ENCOUNTER — Other Ambulatory Visit: Payer: Self-pay

## 2023-07-07 ENCOUNTER — Ambulatory Visit
Admission: RE | Admit: 2023-07-07 | Discharge: 2023-07-07 | Disposition: A | Discharge status: Home / Self Care | Source: Ambulatory Visit | Attending: Radiation Oncology | Admitting: Radiation Oncology

## 2023-07-07 DIAGNOSIS — Z510 Encounter for antineoplastic radiation therapy: Secondary | ICD-10-CM | POA: Diagnosis not present

## 2023-07-07 LAB — RAD ONC ARIA SESSION SUMMARY
Course Elapsed Days: 13
Plan Fractions Treated to Date: 10
Plan Prescribed Dose Per Fraction: 1.8 Gy
Plan Total Fractions Prescribed: 25
Plan Total Prescribed Dose: 45 Gy
Reference Point Dosage Given to Date: 18 Gy
Reference Point Session Dosage Given: 1.8 Gy
Session Number: 10

## 2023-07-07 NOTE — Assessment & Plan Note (Signed)
-  cTxN1M0, invasive squamous cell carcinoma -Present with progressive dysphagia since December 2024. -EGD on 05/27/2023 which showed a mass in the proximal esophagus, 2 to 3 cm below introitus.  Biopsy confirmed invasive squamous cell carcinoma.  -PET scan showed hypermetabolic lesion in the proximal esophagus, with a positive peritracheal lymph node.  No distant metastasis -Status post PEG feeding tube placement  -He started chemoRT on 06/24/2023 -ED visit for chest pain on 06/29/2023

## 2023-07-08 ENCOUNTER — Ambulatory Visit

## 2023-07-08 ENCOUNTER — Other Ambulatory Visit

## 2023-07-08 ENCOUNTER — Inpatient Hospital Stay (HOSPITAL_BASED_OUTPATIENT_CLINIC_OR_DEPARTMENT_OTHER): Admitting: Hematology

## 2023-07-08 ENCOUNTER — Inpatient Hospital Stay

## 2023-07-08 ENCOUNTER — Ambulatory Visit
Admission: RE | Admit: 2023-07-08 | Discharge: 2023-07-08 | Disposition: A | Discharge status: Home / Self Care | Source: Ambulatory Visit | Attending: Radiation Oncology | Admitting: Radiation Oncology

## 2023-07-08 ENCOUNTER — Encounter: Payer: Self-pay | Admitting: Hematology

## 2023-07-08 ENCOUNTER — Other Ambulatory Visit: Payer: Self-pay

## 2023-07-08 ENCOUNTER — Inpatient Hospital Stay: Admitting: Dietician

## 2023-07-08 ENCOUNTER — Encounter

## 2023-07-08 VITALS — HR 87 | Resp 18

## 2023-07-08 VITALS — BP 102/76 | HR 104 | Temp 98.1°F | Resp 21 | Ht 74.0 in | Wt 238.5 lb

## 2023-07-08 DIAGNOSIS — C153 Malignant neoplasm of upper third of esophagus: Secondary | ICD-10-CM

## 2023-07-08 DIAGNOSIS — Z510 Encounter for antineoplastic radiation therapy: Secondary | ICD-10-CM | POA: Diagnosis not present

## 2023-07-08 DIAGNOSIS — Z452 Encounter for adjustment and management of vascular access device: Secondary | ICD-10-CM

## 2023-07-08 LAB — CBC WITH DIFFERENTIAL (CANCER CENTER ONLY)
Abs Immature Granulocytes: 0.05 10*3/uL (ref 0.00–0.07)
Basophils Absolute: 0 10*3/uL (ref 0.0–0.1)
Basophils Relative: 1 %
Eosinophils Absolute: 0.2 10*3/uL (ref 0.0–0.5)
Eosinophils Relative: 4 %
HCT: 34 % — ABNORMAL LOW (ref 39.0–52.0)
Hemoglobin: 12.1 g/dL — ABNORMAL LOW (ref 13.0–17.0)
Immature Granulocytes: 1 %
Lymphocytes Relative: 12 %
Lymphs Abs: 0.7 10*3/uL (ref 0.7–4.0)
MCH: 29.7 pg (ref 26.0–34.0)
MCHC: 35.6 g/dL (ref 30.0–36.0)
MCV: 83.5 fL (ref 80.0–100.0)
Monocytes Absolute: 0.5 10*3/uL (ref 0.1–1.0)
Monocytes Relative: 9 %
Neutro Abs: 4.2 10*3/uL (ref 1.7–7.7)
Neutrophils Relative %: 73 %
Platelet Count: 335 10*3/uL (ref 150–400)
RBC: 4.07 MIL/uL — ABNORMAL LOW (ref 4.22–5.81)
RDW: 18.5 % — ABNORMAL HIGH (ref 11.5–15.5)
WBC Count: 5.6 10*3/uL (ref 4.0–10.5)
nRBC: 1.1 % — ABNORMAL HIGH (ref 0.0–0.2)

## 2023-07-08 LAB — RAD ONC ARIA SESSION SUMMARY
Course Elapsed Days: 14
Plan Fractions Treated to Date: 11
Plan Prescribed Dose Per Fraction: 1.8 Gy
Plan Total Fractions Prescribed: 25
Plan Total Prescribed Dose: 45 Gy
Reference Point Dosage Given to Date: 19.8 Gy
Reference Point Session Dosage Given: 1.8 Gy
Session Number: 11

## 2023-07-08 LAB — CMP (CANCER CENTER ONLY)
ALT: 21 U/L (ref 0–44)
AST: 18 U/L (ref 15–41)
Albumin: 4.1 g/dL (ref 3.5–5.0)
Alkaline Phosphatase: 125 U/L (ref 38–126)
Anion gap: 5 (ref 5–15)
BUN: 20 mg/dL (ref 8–23)
CO2: 30 mmol/L (ref 22–32)
Calcium: 9.5 mg/dL (ref 8.9–10.3)
Chloride: 103 mmol/L (ref 98–111)
Creatinine: 0.79 mg/dL (ref 0.61–1.24)
GFR, Estimated: >60 mL/min (ref >60)
Glucose, Bld: 105 mg/dL — ABNORMAL HIGH (ref 70–99)
Potassium: 4.3 mmol/L (ref 3.5–5.1)
Sodium: 138 mmol/L (ref 135–145)
Total Bilirubin: 1.1 mg/dL (ref 0.0–1.2)
Total Protein: 7.5 g/dL (ref 6.5–8.1)

## 2023-07-08 MED ORDER — HEPARIN SOD (PORK) LOCK FLUSH 100 UNIT/ML IV SOLN
250.0000 [IU] | Freq: Once | INTRAVENOUS | Status: AC | PRN
  Administered 2023-07-08: 250 [IU]

## 2023-07-08 MED ORDER — SODIUM CHLORIDE 0.9 % IV SOLN
275.8000 mg | Freq: Once | INTRAVENOUS | Status: AC
  Administered 2023-07-08: 280 mg via INTRAVENOUS
  Filled 2023-07-08: qty 28

## 2023-07-08 MED ORDER — DIPHENHYDRAMINE HCL 50 MG/ML IJ SOLN
25.0000 mg | Freq: Once | INTRAMUSCULAR | Status: AC
  Administered 2023-07-08: 25 mg via INTRAVENOUS
  Filled 2023-07-08: qty 1

## 2023-07-08 MED ORDER — SODIUM CHLORIDE 0.9 % IV SOLN: INTRAVENOUS | Status: DC

## 2023-07-08 MED ORDER — PALONOSETRON HCL INJECTION 0.25 MG/5ML
0.2500 mg | Freq: Once | INTRAVENOUS | Status: AC
  Administered 2023-07-08: 0.25 mg via INTRAVENOUS
  Filled 2023-07-08: qty 5

## 2023-07-08 MED ORDER — SODIUM CHLORIDE 0.9% FLUSH
10.0000 mL | Freq: Once | INTRAVENOUS | Status: AC
  Administered 2023-07-08: 10 mL

## 2023-07-08 MED ORDER — FAMOTIDINE IN NACL 20-0.9 MG/50ML-% IV SOLN
20.0000 mg | Freq: Once | INTRAVENOUS | Status: AC
  Administered 2023-07-08: 20 mg via INTRAVENOUS
  Filled 2023-07-08: qty 50

## 2023-07-08 MED ORDER — PACLITAXEL CHEMO INJECTION 300 MG/50ML
50.0000 mg/m2 | Freq: Once | INTRAVENOUS | Status: AC
  Administered 2023-07-08: 120 mg via INTRAVENOUS
  Filled 2023-07-08: qty 20

## 2023-07-08 MED ORDER — SODIUM CHLORIDE 0.9% FLUSH
3.0000 mL | INTRAVENOUS | Status: DC | PRN
Start: 2023-07-08 — End: 2023-07-08
  Administered 2023-07-08: 10 mL

## 2023-07-08 MED ORDER — NYSTATIN 100000 UNIT/ML MT SUSP
10.0000 mL | Freq: Four times a day (QID) | OROMUCOSAL | 0 refills | Status: DC
Start: 2023-07-08 — End: 2023-07-22

## 2023-07-08 MED ORDER — DEXAMETHASONE SODIUM PHOSPHATE 10 MG/ML IJ SOLN
10.0000 mg | Freq: Once | INTRAMUSCULAR | Status: AC
  Administered 2023-07-08: 10 mg via INTRAVENOUS
  Filled 2023-07-08: qty 1

## 2023-07-08 MED ORDER — FAMOTIDINE 40 MG/5ML PO SUSR: 20.0000 mg | Freq: Every day | ORAL | 1 refills | Status: DC

## 2023-07-08 NOTE — Patient Instructions (Signed)
 [facility] - A DEPT OF [facility]  Discharge Instructions: Thank you for choosing [facility] to provide your oncology and hematology care.   If you have a lab appointment with the Cancer Center, please go directly to the Cancer Center and check in at the registration area.   Wear comfortable clothing and clothing appropriate for easy access to any Portacath or PICC line.   We strive to give you quality time with your provider. You may need to reschedule your appointment if you arrive late (15 or more minutes).  Arriving late affects you and other patients whose appointments are after yours.  Also, if you miss three or more appointments without notifying the office, you may be dismissed from the clinic at the provider's discretion.      For prescription refill requests, have your pharmacy contact our office and allow 72 hours for refills to be completed.    Today you received the following chemotherapy and/or immunotherapy agents paclitaxel, carboplatin      To help prevent nausea and vomiting after your treatment, we encourage you to take your nausea medication as directed.  BELOW ARE SYMPTOMS THAT SHOULD BE REPORTED IMMEDIATELY: *FEVER GREATER THAN 100.4 F (38 C) OR HIGHER *CHILLS OR SWEATING *NAUSEA AND VOMITING THAT IS NOT CONTROLLED WITH YOUR NAUSEA MEDICATION *UNUSUAL SHORTNESS OF BREATH *UNUSUAL BRUISING OR BLEEDING *URINARY PROBLEMS (pain or burning when urinating, or frequent urination) *BOWEL PROBLEMS (unusual diarrhea, constipation, pain near the anus) TENDERNESS IN MOUTH AND THROAT WITH OR WITHOUT PRESENCE OF ULCERS (sore throat, sores in mouth, or a toothache) UNUSUAL RASH, SWELLING OR PAIN  UNUSUAL VAGINAL DISCHARGE OR ITCHING   Items with * indicate a potential emergency and should be followed up as soon as possible or go to the Emergency Department if any problems should occur.  Please show the CHEMOTHERAPY ALERT CARD or  IMMUNOTHERAPY ALERT CARD at check-in to the Emergency Department and triage nurse.  Should you have questions after your visit or need to cancel or reschedule your appointment, please contact [facility] - A DEPT OF [facility]  Dept: [phone]  and follow the prompts.  Office hours are 8:00 a.m. to 4:30 p.m. Monday - Friday. Please note that voicemails left after 4:00 p.m. may not be returned until the following business day.  We are closed weekends and major holidays. You have access to a nurse at all times for urgent questions. Please call the main number to the clinic Dept: [phone] and follow the prompts.   For any non-urgent questions, you may also contact your provider using MyChart. We now offer e-Visits for anyone 44 and older to request care online for non-urgent symptoms. For details visit [email or web address].   Also download the MyChart app! Go to the app store, search "MyChart", open the app, select [facility], and log in with your MyChart username and password.

## 2023-07-08 NOTE — Progress Notes (Signed)
 [facility]   [phone] Fax:([phone]) [phone]   Clinic Follow up Note   Patient Care Team: Lorella Roles, MD as PCP - General (Family Medicine) Olinda Bertrand, DO as PCP - Cardiology (Cardiology) Clance, Deena Farrier, MD as Referring Physician (Pulmonary Disease) Almeda Jacobs, MD as Consulting Physician (Hematology and Oncology) Colie Dawes, MD as Attending Physician (Radiation Oncology) Sonja Rockford, MD as Consulting Physician (Hematology and Oncology)  Date of Service:  07/08/2023  CHIEF COMPLAINT: f/u of esophageal cancer  CURRENT THERAPY:  Concurrent chemoradiation with weekly carboplatin  and paclitaxel   Oncology History   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 -EGD on 05/27/2023 which showed a mass in the proximal esophagus, 2 to 3 cm below introitus.  Biopsy confirmed invasive squamous cell carcinoma.  -PET scan showed hypermetabolic lesion in the proximal esophagus, with a positive peritracheal lymph node.  No distant metastasis -Status post PEG feeding tube placement  -He started chemoRT on 06/24/2023 -ED visit for chest pain on 06/29/2023  Assessment & Plan Esophageal cancer Currently undergoing chemotherapy and radiation for esophageal cancer. He reports no nausea or sickness from the last two chemotherapy cycles. He receives nutrition via a feeding tube and reports no pain or discomfort. Blood counts are pending, and weekly blood tests will be conducted. Infusion is scheduled for today at 10:00 AM. He has transportation issues, with social services providing assistance. - Conduct weekly blood tests - Continue chemotherapy and radiation - Maintain nutrition through feeding tube - Proceed with scheduled infusion at 10:00 AM - Coordinate transportation with social services  Pain in neck due to radiation Experiencing neck pain attributed to  radiation treatment. No additional pain reported in other areas. - Evaluate neck pain management needs  Oral thrush - Will call in nystatin suspension   Plan - Lab reviewed, adequate for treatment, will proceed to cycle 3 chemo today and continue weekly for 3 more weeks - I called in nystatin suspension for his oral thrush, and I refilled liquid Pepcid  - He will continue radiation - Lab, follow-up and chemo in a week    SUMMARY OF ONCOLOGIC HISTORY: Oncology History  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HCC)  06/14/2013 Procedure   Biopsy of the tonsil confirmrf squamous cell carcinoma. 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  History of Present Illness Melvin Hudson is a 68 year old male with esophageal cancer who presents for follow-up.  He is undergoing chemotherapy and radiation for esophageal cancer. He has tolerated the last two cycles of chemotherapy well, with no nausea or sickness.  He experiences neck pain at the radiation site but no stomach pain or cough.  He receives nutrition through a feeding tube, which he manages independently. His sister assists with dressing  changes and personal care. He lives alone and uses social services for transportation to appointments, though there are delays in pick-up times.  He has a PICC line for treatment, which remains in good condition.     All other systems were reviewed with the patient and are negative.  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    SURGICAL HISTORY: Past Surgical History:  Procedure Laterality Date   COLONOSCOPY Left 04/11/2014   Procedure: COLONOSCOPY;  Surgeon: Evangeline Hilts, MD;  Location: [facility] ENDOSCOPY;  Service: Endoscopy;  Laterality: Left;   COLONOSCOPY N/A 04/20/2014   Procedure: COLONOSCOPY;  Surgeon: Yvetta Herbert, MD;  Location: [facility] ENDOSCOPY;  Service: Endoscopy;  Laterality: N/A;   COLONOSCOPY W/ BIOPSIES AND POLYPECTOMY     benign   DIRECT LARYNGOSCOPY  01/05/2014   DIRECT LARYNGOSCOPY N/A 01/05/2014   Procedure: DIRECT LARYNGOSCOPY AND BIOPSY;  Surgeon: Janita Mellow, MD;  Location: [facility] OR;  Service: ENT;  Laterality: N/A;   ESOPHAGOSCOPY WITH DILITATION N/A 05/27/2023   Procedure: ESOPHAGOSCOPY;  Surgeon: Janita Mellow, MD;  Location: [facility] OR;  Service: ENT;  Laterality: N/A;   IR GASTROSTOMY TUBE MOD SED  06/12/2023   KNEE ARTHROSCOPY Right ~ 1994   LEFT AND RIGHT HEART CATHETERIZATION WITH CORONARY/GRAFT ANGIOGRAM N/A 12/31/2010   Procedure: LEFT AND RIGHT HEART CATHETERIZATION WITH Estella Helling;  Surgeon: Jessica Morn, MD;  Location: [facility] CATH LAB;  Service:  Cardiovascular;  Laterality: N/A;   MULTIPLE EXTRACTIONS WITH ALVEOLOPLASTY N/A 07/08/2013   Procedure: Extraction of tooth #'s 22, 27 with alveoloplasty and bilateal mandibular facial exostoses reductions;  Surgeon: Carol Chroman, DDS;  Location: [facility] ORS;  Service: Oral Surgery;  Laterality: N/A;   MULTIPLE TOOTH EXTRACTIONS  ~ 2008    I have reviewed the social history and family history with the patient and they are unchanged from previous note.  ALLERGIES:  is allergic to azilsartan and advil [ibuprofen].  MEDICATIONS:  Current Outpatient Medications  Medication Sig Dispense Refill   albuterol  (VENTOLIN  HFA) 108 (90 Base) MCG/ACT inhaler Inhale 2 puffs into the lungs every 4 (four) hours as needed for wheezing (Cough).     allopurinol  (ZYLOPRIM ) 300 MG tablet Take 300 mg by mouth every other day.     apixaban  (ELIQUIS ) 5 MG TABS tablet Take 1 tablet (5 mg total) by mouth 2 (two) times daily. 60 tablet 1   APIXABAN  (ELIQUIS ) VTE STARTER PACK (10MG  AND 5MG ) Take as directed on package: start with two-5mg  tablets twice daily for 7 days. On day 8, switch to one-5mg  tablet twice daily. 74 each 0   bismuth subsalicylate (PEPTO BISMOL) 262 MG/15ML suspension Take 30 mLs by mouth every 6 (six) hours as needed for  indigestion or diarrhea or loose stools.     Blood Glucose Monitoring Suppl (ONETOUCH VERIO FLEX SYSTEM) w/Device KIT USE TO CHECK BLOOD SUGAR TWICE DAILY     calcium  carbonate (TUMS - DOSED IN MG ELEMENTAL CALCIUM ) 500 MG chewable tablet Chew 2 tablets (400 mg of elemental calcium  total) by mouth 3 (three) times daily with meals. (Patient taking differently: Chew 400 mg of elemental calcium  by mouth daily as needed.) 180 tablet 1   clonazePAM  (KLONOPIN ) 1 MG disintegrating tablet Take 1 mg by mouth daily.     diphenhydrAMINE  (BENADRYL ) 25 MG tablet Take 1 tablet (25 mg total) by mouth every 6 (six) hours. (Patient taking differently: Take 25 mg by mouth every 6 (six) hours as needed for  allergies or sleep.) 20 tablet 0   gabapentin  (NEURONTIN ) 300 MG capsule Take 300 mg by mouth 3 (three) times daily.   2   HYDROcodone -acetaminophen  (HYCET) 7.5-325 mg/15 ml solution Take 10 mLs by mouth 4 (four) times daily as needed for moderate pain (pain score 4-6). 473 mL 0   hydrocortisone  (ANUSOL -HC) 2.5 % rectal cream Place 1 Application rectally daily as needed for hemorrhoids or anal itching.     isosorbide -hydrALAZINE  (BIDIL ) 20-37.5 MG tablet Take 1 tablet by mouth in the morning and at bedtime. 90 tablet 3   lactose free nutrition (BOOST) LIQD Take 237 mLs by mouth daily.     lidocaine  (XYLOCAINE ) 2 % solution Patient: Mix 1part 2% viscous lidocaine , 1part H20. Swallow 10mL of diluted mixture, 30min before meals and at bedtime, up to QID 200 mL 3   LORazepam  (ATIVAN ) 0.5 MG tablet Take 1 tablet PRN claustrophobia 20 minutes prior to radiation. Will need a driver. Use only as needed. 5 tablet 0   nystatin (MYCOSTATIN) 100000 UNIT/ML suspension Take 10 mLs (1,000,000 Units total) by mouth 4 (four) times daily. For oral thrush. Stop when thrush resolves 473 mL 0   omeprazole  (PRILOSEC) 20 MG capsule Take 20 mg by mouth daily.   4   ondansetron  (ZOFRAN ) 8 MG tablet Take 1 tablet (8 mg total) by mouth every 8 (eight) hours as needed for nausea or vomiting. Start on the third day after chemotherapy. 30 tablet 1   ONETOUCH VERIO test strip USE TO CHECK BLOOD SUGAR TWICE DAILY     polyethylene glycol (MIRALAX  / GLYCOLAX ) 17 g packet Take 17 g by mouth daily. (Patient taking differently: Take 17 g by mouth daily as needed.) 14 each 0   prochlorperazine  (COMPAZINE ) 10 MG tablet Take 1 tablet (10 mg total) by mouth every 6 (six) hours as needed for nausea or vomiting. 30 tablet 1   Protein (FEEDING SUPPLEMENT, PROSOURCE TF20,) liquid Place 60 mLs into feeding tube 2 (two) times daily.     psyllium (HYDROCIL/METAMUCIL) 95 % PACK Take 1 packet by mouth daily. (Patient taking differently: Take 1 packet  by mouth daily as needed for mild constipation or moderate constipation.) 240 each 0   senna-docusate (SENOKOT-S) 8.6-50 MG tablet Take 1 tablet by mouth 2 (two) times daily. (Patient taking differently: Take 1 tablet by mouth daily as needed for mild constipation or moderate constipation.) 60 tablet 1   Water  For Irrigation, Sterile (FREE WATER ) SOLN Place 100 mLs into feeding tube every 6 (six) hours.     atorvastatin  (LIPITOR) 40 MG tablet Take 1 tablet (40 mg total) by mouth at bedtime. (Patient taking differently: Take 40 mg by mouth in the morning.) 90 tablet 1   famotidine  (PEPCID ) 40  MG/5ML suspension Place 2.5 mLs (20 mg total) into feeding tube daily. 50 mL 1   No current facility-administered medications for this visit.    PHYSICAL EXAMINATION: ECOG PERFORMANCE STATUS: 2 - Symptomatic, <50% confined to bed  Vitals:   07/08/23 0939  BP: 102/76  Pulse: (!) 104  Resp: (!) 21  Temp: 98.1 F (36.7 C)  SpO2: 100%   Wt Readings from Last 3 Encounters:  07/08/23 238 lb 8 oz (108.2 kg)  07/01/23 239 lb 14.4 oz (108.8 kg)  06/24/23 246 lb 12 oz (111.9 kg)     GENERAL:alert, no distress and comfortable SKIN: skin color, texture, turgor are normal, no rashes or significant lesions EYES: normal, Conjunctiva are pink and non-injected, sclera clear NECK: supple, thyroid  normal size, non-tender, without nodularity LYMPH:  no palpable lymphadenopathy in the cervical, axillary  LUNGS: clear to auscultation and percussion with normal breathing effort HEART: regular rate & rhythm and no murmurs and no lower extremity edema ABDOMEN:abdomen soft, non-tender and normal bowel sounds Musculoskeletal:no cyanosis of digits and no clubbing  NEURO: alert & oriented x 3 with fluent speech, no focal motor/sensory deficits ABDOMEN: Feeding tube site appears clean wo discharge  Physical Exam   LABORATORY DATA:  I have reviewed the data as listed    Latest Ref Rng & Units 07/08/2023    9:53 AM  07/01/2023    8:57 AM 06/29/2023   11:20 AM  CBC  WBC 4.0 - 10.5 K/uL 5.6  8.1  7.9   Hemoglobin 13.0 - 17.0 g/dL 16.1  09.6  04.5   Hematocrit 39.0 - 52.0 % 34.0  37.3  38.7   Platelets 150 - 400 K/uL 335  475  428         Latest Ref Rng & Units 07/08/2023    9:53 AM 07/01/2023    8:57 AM 06/29/2023   11:20 AM  CMP  Glucose 70 - 99 mg/dL 409  811  914   BUN 8 - 23 mg/dL 20  17  16    Creatinine 0.61 - 1.24 mg/dL 7.82  9.56  2.13   Sodium 135 - 145 mmol/L 138  137  136   Potassium 3.5 - 5.1 mmol/L 4.3  4.0  4.6   Chloride 98 - 111 mmol/L 103  102  98   CO2 22 - 32 mmol/L 30  29  28    Calcium  8.9 - 10.3 mg/dL 9.5  9.4  8.9   Total Protein 6.5 - 8.1 g/dL 7.5  7.6  7.3   Total Bilirubin 0.0 - 1.2 mg/dL 1.1  1.1  1.6   Alkaline Phos 38 - 126 U/L 125  125  114   AST 15 - 41 U/L 18  17  24    ALT 0 - 44 U/L 21  16  19        RADIOGRAPHIC STUDIES: I have personally reviewed the radiological images as listed and agreed with the findings in the report. No results found.    No orders of the defined types were placed in this encounter.  All questions were answered. The patient knows to call the clinic with any problems, questions or concerns. No barriers to learning was detected. The total time spent in the appointment was 25 minutes, including review of chart and various tests results, discussions about plan of care and coordination of care plan     Sonja [place], MD 07/08/2023

## 2023-07-08 NOTE — Progress Notes (Signed)
 Nutrition Follow-up:  Patient with esophageal cancer. Patient receiving concurrent chemoradiation therapy with weekly carboplatin  + paclitaxel  (first chemo 5/7).   S/p PEG 4/24 during hospital admission DME: [employer]  Met with patient in infusion. He reports doing well overall. Patient wishes he could eat orally. He is tolerating 5-6 cartons Nutren 1.5 (goal is 6). Patient occasional reflux when he lays down. Endorses staying upright ~45 minutes after bolus. Noted white coating on tongue. This is not causing discomfort. Dr. Maryalice Smaller to see pt in infusion for this today. He is using Listerine instead of baking soda salt water  rinses. Patient denies constipation or diarrhea.    Medications: reviewed   Labs: glucose 105  Anthropometrics: Wt 238 lb 8 oz today - stable x 1 week  5/14 - 239 lb 14.4 oz  5/7 - 246 lb 12 oz 5/2 - 250 lb 6.4 oz 4/27 - 252 lb 6.8 oz    Estimated Energy Needs   Kcals: 2200-2400 Protein: 100-120 Fluid: >2.2 L   NUTRITION DIAGNOSIS: Food and nutrition related knowledge deficit ongoing    INTERVENTION:  Continue working to increase tube feedings to goal as tolerated Educated on protonix  suspension per med list for reflux Pt will stop using listerine. Encourage baking soda salt water  rinses several times daily Pt receiving HH for assistance with enteral needs at home    MONITORING, EVALUATION, GOAL: wt trends, TF, intake   NEXT VISIT: Wednesday May 29 during infusion

## 2023-07-09 ENCOUNTER — Other Ambulatory Visit: Payer: Self-pay

## 2023-07-09 ENCOUNTER — Ambulatory Visit
Admission: RE | Admit: 2023-07-09 | Discharge: 2023-07-09 | Disposition: A | Discharge status: Home / Self Care | Source: Ambulatory Visit | Attending: Radiation Oncology | Admitting: Radiation Oncology

## 2023-07-09 DIAGNOSIS — Z510 Encounter for antineoplastic radiation therapy: Secondary | ICD-10-CM | POA: Diagnosis not present

## 2023-07-09 LAB — RAD ONC ARIA SESSION SUMMARY
Course Elapsed Days: 15
Plan Fractions Treated to Date: 12
Plan Prescribed Dose Per Fraction: 1.8 Gy
Plan Total Fractions Prescribed: 25
Plan Total Prescribed Dose: 45 Gy
Reference Point Dosage Given to Date: 21.6 Gy
Reference Point Session Dosage Given: 1.8 Gy
Session Number: 12

## 2023-07-10 ENCOUNTER — Ambulatory Visit
Admission: RE | Admit: 2023-07-10 | Discharge: 2023-07-10 | Disposition: A | Discharge status: Home / Self Care | Source: Ambulatory Visit | Attending: Radiation Oncology | Admitting: Radiation Oncology

## 2023-07-10 ENCOUNTER — Other Ambulatory Visit: Payer: Self-pay

## 2023-07-10 DIAGNOSIS — Z510 Encounter for antineoplastic radiation therapy: Secondary | ICD-10-CM | POA: Diagnosis not present

## 2023-07-10 LAB — RAD ONC ARIA SESSION SUMMARY
Course Elapsed Days: 16
Plan Fractions Treated to Date: 13
Plan Prescribed Dose Per Fraction: 1.8 Gy
Plan Total Fractions Prescribed: 25
Plan Total Prescribed Dose: 45 Gy
Reference Point Dosage Given to Date: 23.4 Gy
Reference Point Session Dosage Given: 1.8 Gy
Session Number: 13

## 2023-07-14 ENCOUNTER — Ambulatory Visit
Admission: RE | Admit: 2023-07-14 | Discharge: 2023-07-14 | Disposition: A | Discharge status: Home / Self Care | Source: Ambulatory Visit | Attending: Radiation Oncology | Admitting: Radiation Oncology

## 2023-07-14 ENCOUNTER — Other Ambulatory Visit: Payer: Self-pay

## 2023-07-14 DIAGNOSIS — Z510 Encounter for antineoplastic radiation therapy: Secondary | ICD-10-CM | POA: Diagnosis not present

## 2023-07-14 LAB — RAD ONC ARIA SESSION SUMMARY
Course Elapsed Days: 20
Plan Fractions Treated to Date: 14
Plan Prescribed Dose Per Fraction: 1.8 Gy
Plan Total Fractions Prescribed: 25
Plan Total Prescribed Dose: 45 Gy
Reference Point Dosage Given to Date: 25.2 Gy
Reference Point Session Dosage Given: 1.8 Gy
Session Number: 14

## 2023-07-14 NOTE — Assessment & Plan Note (Addendum)
-  cTxN1M0, invasive squamous cell carcinoma -Present with progressive dysphagia since December 2024. -EGD on 05/27/2023 which showed a mass in the proximal esophagus, 2 to 3 cm below introitus.  Biopsy confirmed invasive squamous cell carcinoma.  -PET scan showed hypermetabolic lesion in the proximal esophagus, with a positive peritracheal lymph node.  No distant metastasis -Status post PEG feeding tube placement  -He started chemoRT on 06/24/2023 -ED visit for chest pain on 06/29/2023 - Most recent chemotherapy cycles have been tolerated well.  Was treated for oral thrush with nystatin  suspension.  P.o. Pepcid  also added for heartburn. 07/15/2023 -cycle 4 of chemotherapy.  2 cycles remaining.  Proceed with chemotherapy carboplatin  and paclitaxel  today.  Daily radiation as scheduled.  Last date set for 08/03/2023.

## 2023-07-14 NOTE — Progress Notes (Addendum)
 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 -EGD on 05/27/2023 which showed a mass in the proximal esophagus, 2 to 3 cm below introitus.  Biopsy confirmed invasive squamous cell carcinoma.  -PET scan showed hypermetabolic lesion in the proximal esophagus, with a positive peritracheal lymph node.  No distant metastasis -Status post PEG feeding tube placement  -He started chemoRT on 06/24/2023 -ED visit for chest pain on 06/29/2023 - Most recent chemotherapy cycles have been tolerated well.  Was treated for oral thrush with nystatin  suspension.  P.o. Pepcid  also added for heartburn. 07/15/2023 -cycle 4 of chemotherapy.  2 cycles remaining.   Thrush Noticed on tongue and buccal mucosa.  Some erythema present in the posterior oropharynx.  Continue to use nystatin  solution.  Swish and swallow 4 times daily.  Throat pain Continues to have moderate to severe throat pain.  Worse with radiation.  Continue with Pepcid  twice daily.  Renew prescription for liquid hydrocodone /APAP.  Increase dose to 15 mL up to 4 times daily as needed for pain.  Plan Labs reviewed. - Mild and stable anemia.  Hgb 11.2 and HCT 32.0.  CMP unremarkable. Continue with nystatin  solution for thrush. Hydrocodone /APAP solution, 15 mL up to 4 times daily as needed for pain refilled today. Continue radiation as scheduled.  Final date set  for 08/03/2023. Labs and patient presentation are satisfactory for treatment.  Proceed with carboplatin  and paclitaxel  today. Labs with PICC line maintenance, follow-up, and chemotherapy with carboplatin  and Taxol  in 1 week.  The patient understands the plans discussed today and is in agreement with them.  He knows to contact our office if he develops concerns prior to his next appointment.  I provided 25 minutes of face-to-face time during this encounter and > 50% was spent counseling as documented under my assessment and plan.    Sharyon Deis, NP  [facility] - A DEPT OF [facility] [address] [place] [address] Dept: [phone] Dept Fax: [phone]   No orders of the defined types were placed in this encounter.     CHIEF COMPLAINT:  CC: Esophageal cancer  Current Treatment: Concurrent chemoradiation with weekly carboplatin  and paclitaxel   INTERVAL HISTORY:  Melvin Hudson is here today for repeat clinical assessment.  He was last seen by Dr. Maryalice Smaller on 07/08/2023.  Started concurrent chemo radiation on 06/24/2023.  Has feeding tube for nutrition.  Today is cycle 1 day 22.  Previous 2 cycles have been tolerated well.  He has throat pain.  Only taking Pepcid .  Continues to have thrush.  Using nystatin   solution.  Sometimes feels air in the feeding tube.  This is not painful.  Tube remains patent.  Finishes radiation on 6/16.  Has 2 cycles of chemotherapy remaining after today.  He denies chest pain, chest pressure, or shortness of breath. He denies headaches or visual disturbances. He denies abdominal pain, nausea, vomiting, or changes in bowel or bladder habits.  He denies fevers or chills. He denies pain. He  is unable to  eat by mouth, difficulty swallowing.  His weight has been stable.  I have reviewed the past medical history, past surgical history, social history and family history with the patient and they are  unchanged from previous note.  ALLERGIES:  is allergic to azilsartan and advil [ibuprofen].  MEDICATIONS:  Current Outpatient Medications  Medication Sig Dispense Refill   albuterol  (VENTOLIN  HFA) 108 (90 Base) MCG/ACT inhaler Inhale 2 puffs into the lungs every 4 (four) hours as needed for wheezing (Cough).     allopurinol  (ZYLOPRIM ) 300 MG tablet Take 300 mg by mouth every other day.     apixaban  (ELIQUIS ) 5 MG TABS tablet Take 1 tablet (5 mg total) by mouth 2 (two) times daily. 60 tablet 1   APIXABAN  (ELIQUIS ) VTE STARTER PACK (10MG  AND 5MG ) Take as directed on package: start with two-5mg  tablets twice daily for 7 days. On day 8, switch to one-5mg  tablet twice daily. 74 each 0   bismuth subsalicylate (PEPTO BISMOL) 262 MG/15ML suspension Take 30 mLs by mouth every 6 (six) hours as needed for indigestion or diarrhea or loose stools.     Blood Glucose Monitoring Suppl (ONETOUCH VERIO FLEX SYSTEM) w/Device KIT USE TO CHECK BLOOD SUGAR TWICE DAILY     calcium  carbonate (TUMS - DOSED IN MG ELEMENTAL CALCIUM ) 500 MG chewable tablet Chew 2 tablets (400 mg of elemental calcium  total) by mouth 3 (three) times daily with meals. (Patient taking differently: Chew 400 mg of elemental calcium  by mouth daily as needed.) 180 tablet 1   clonazePAM  (KLONOPIN ) 1 MG disintegrating tablet Take 1 mg by mouth daily.     diphenhydrAMINE  (BENADRYL ) 25 MG tablet Take 1 tablet (25 mg total) by mouth every 6 (six) hours. (Patient taking differently: Take 25 mg by mouth every 6 (six) hours as needed for allergies or sleep.) 20 tablet 0   famotidine  (PEPCID ) 40 MG/5ML suspension Place 2.5 mLs (20 mg total) into feeding tube daily. 50 mL 1   gabapentin  (NEURONTIN ) 300 MG capsule Take 300 mg by mouth 3 (three) times daily.   2   HYDROcodone -acetaminophen  (HYCET) 7.5-325 mg/15 ml solution Take 15 mLs by mouth 4 (four) times daily as needed for moderate pain (pain score 4-6). 473 mL 0   hydrocortisone  (ANUSOL -HC) 2.5 % rectal  cream Place 1 Application rectally daily as needed for hemorrhoids or anal itching.     isosorbide -hydrALAZINE  (BIDIL ) 20-37.5 MG tablet Take 1 tablet by mouth in the morning and at bedtime. 90 tablet 3   lactose free nutrition (BOOST) LIQD Take 237 mLs by mouth daily.     lidocaine  (XYLOCAINE ) 2 % solution Patient: Mix 1part 2% viscous lidocaine , 1part H20. Swallow 10mL of diluted mixture, before meals and at bedtime, up to QID 200 mL 3   LORazepam  (ATIVAN ) 0.5 MG tablet Take 1 tablet PRN claustrophobia 20 minutes prior to radiation. Will need a driver. Use only as needed. 5 tablet 0   nystatin  (MYCOSTATIN ) 100000 UNIT/ML suspension Take 10 mLs (1,000,000 Units total) by mouth 4 (four) times daily. For oral thrush. Stop when thrush resolves 473 mL 0   omeprazole  (PRILOSEC) 20 MG capsule Take 20 mg by mouth daily.   4   ondansetron  (ZOFRAN ) 8 MG tablet Take 1 tablet (8 mg total) by mouth every 8 (eight) hours as needed for nausea or vomiting. Start on the third day after chemotherapy. 30 tablet 1   ONETOUCH  VERIO test strip USE TO CHECK BLOOD SUGAR TWICE DAILY     polyethylene glycol (MIRALAX  / GLYCOLAX ) 17 g packet Take 17 g by mouth daily. (Patient taking differently: Take 17 g by mouth daily as needed.) 14 each 0   prochlorperazine  (COMPAZINE ) 10 MG tablet Take 1 tablet (10 mg total) by mouth every 6 (six) hours as needed for nausea or vomiting. 30 tablet 1   Protein (FEEDING SUPPLEMENT, PROSOURCE TF20,) liquid Place 60 mLs into feeding tube 2 (two) times daily.     psyllium (HYDROCIL/METAMUCIL) 95 % PACK Take 1 packet by mouth daily. (Patient taking differently: Take 1 packet by mouth daily as needed for mild constipation or moderate constipation.) 240 each 0   senna-docusate (SENOKOT-S) 8.6-50 MG tablet Take 1 tablet by mouth 2 (two) times daily. (Patient taking differently: Take 1 tablet by mouth daily as needed for mild constipation or moderate constipation.) 60 tablet 1   Water  For  Irrigation, Sterile (FREE WATER ) SOLN Place 100 mLs into feeding tube every 6 (six) hours.     atorvastatin  (LIPITOR) 40 MG tablet Take 1 tablet (40 mg total) by mouth at bedtime. (Patient taking differently: Take 40 mg by mouth in the morning.) 90 tablet 1   No current facility-administered medications for this visit.    HISTORY OF PRESENT ILLNESS:   Oncology History  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HCC)  06/14/2013 Procedure   Biopsy of the tonsil confirmrf squamous cell carcinoma. 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  Continue nystatin  solution 4 times daily.  Has difficulty swallowing.  Respiratory: Denies cough, dyspnea or wheezes Cardiovascular: Denies palpitation, chest discomfort or lower extremity swelling Gastrointestinal:  Denies nausea, heartburn or change in bowel habits.  Notices here in his feeding tube.  Not painful.  Tube remains patent. Skin: Denies abnormal skin rashes Lymphatics: Denies new lymphadenopathy or easy bruising Neurological:Denies numbness, tingling or new  weaknesses Behavioral/Psych: Mood is stable, no new changes  All other systems were reviewed with the patient and are negative.   VITALS:   Today's Vitals   07/15/23 0848 07/15/23 0934  BP: 100/76   Pulse: 79   Resp: 17   Temp: (!) 97.3 F (36.3 C)   SpO2: 97%   Weight: 240 lb 6.4 oz (109 kg)   PainSc:  7    Body mass index is 30.87 kg/m.   Wt Readings from Last 3 Encounters:  07/15/23 240 lb 6.4 oz (109 kg)  07/08/23 238 lb 8 oz (108.2 kg)  07/01/23 239 lb 14.4 oz (108.8 kg)  Body mass index is 30.87 kg/m.  Performance status (ECOG): 1 - Symptomatic but completely ambulatory  PHYSICAL EXAM:   GENERAL:alert, no distress and comfortable SKIN: skin color, texture, turgor are normal, no rashes or significant lesions EYES: normal, Conjunctiva are pink and non-injected, sclera clear OROPHARYNX: Thrush on tongue and buccal mucosa.  Mild erythema noticed in posterior oropharynx. NECK: supple, thyroid  normal size, non-tender, without nodularity LYMPH:  no palpable lymphadenopathy in the cervical, axillary or inguinal LUNGS: clear to auscultation and percussion with normal breathing effort HEART: regular rate & rhythm and no murmurs and no lower extremity edema ABDOMEN:abdomen soft, non-tender and normal bowel sounds.  J-tube patent.  Slight tenderness with palpation at insertion site. Musculoskeletal:no cyanosis of digits and no clubbing  NEURO: alert & oriented x 3 with fluent speech, no focal motor/sensory deficits  LABORATORY DATA:  I have reviewed the data as listed    Component Value Date/Time   NA 136 07/15/2023 0814   NA 139 04/27/2023 0829   NA 139 08/15/2013 1428   K 4.5 07/15/2023 0814   K 4.0 08/15/2013 1428   CL 102 07/15/2023 0814   CO2 31 07/15/2023 0814   CO2 29 08/15/2013 1428   GLUCOSE 139 (H) 07/15/2023 0814   GLUCOSE 155 (H) 08/15/2013 1428   BUN 18 07/15/2023 0814   BUN 6 (L) 04/27/2023 0829   BUN 14.3 08/15/2013 1428   CREATININE 0.76  07/15/2023 0814   CREATININE 1.2 08/15/2013 1428   CALCIUM  9.4 07/15/2023 0814   CALCIUM  9.8 08/15/2013 1428   PROT 7.2 07/15/2023 0814   PROT 7.1 04/27/2023 0829   PROT 7.3 06/30/2013 1438   ALBUMIN 3.9 07/15/2023 0814   ALBUMIN 4.3 04/27/2023 0829   ALBUMIN 3.9 06/30/2013 1438   AST 16 07/15/2023 0814   AST 13 06/30/2013 1438   ALT 17 07/15/2023 0814   ALT 11 06/30/2013 1438   ALKPHOS 113 07/15/2023 0814   ALKPHOS 147 06/30/2013 1438   BILITOT 0.9 07/15/2023 0814   BILITOT 0.59 06/30/2013 1438   GFRNONAA >60 07/15/2023 0814   GFRAA 104 04/10/2020 0824    Lab Results  Component Value Date   WBC 3.9 (L) 07/15/2023   NEUTROABS 2.7 07/15/2023   HGB 11.2 (L) 07/15/2023   HCT 32.0 (L) 07/15/2023   MCV 84.2 07/15/2023   PLT 260 07/15/2023     RADIOGRAPHIC STUDIES: IR PICC PLACEMENT RIGHT >5 YRS INC IMG GUIDE Result Date: 06/30/2023 INDICATION: Patient with history of esophageal cancer ; central venous access requested for chemotherapy EXAM: RIGHT UPPER EXTREMITY PICC LINE PLACEMENT WITH ULTRASOUND AND FLUOROSCOPIC GUIDANCE MEDICATIONS: 4 mL 1% lidocaine  to skin and subcutaneous tissue ANESTHESIA/SEDATION: None FLUOROSCOPY: Radiation Exposure Index (as provided by the fluoroscopic device): 6 mGy Kerma COMPLICATIONS: None immediate. PROCEDURE: The patient was advised of the possible risks and complications and agreed to undergo the procedure. The patient was then brought to the angiographic suite for the procedure. The right arm was prepped with chlorhexidine , draped in the usual sterile fashion using maximum barrier technique (cap and mask, sterile gown, sterile gloves, large sterile sheet, hand hygiene and cutaneous antisepsis) and infiltrated locally with 1% Lidocaine . Ultrasound demonstrated patency of the right brachial vein, and this was documented with an image. Under real-time ultrasound guidance, this vein was accessed with a 21 gauge micropuncture needle and image documentation  was performed. A 0.018 wire was introduced in to the vein. Over this, a 5 [place] dual lumen power injectable PICC was advanced to the  lower SVC/right atrial junction. Fluoroscopy during the procedure and fluoro spot radiograph confirms appropriate catheter position. The catheter was flushed and covered with a sterile dressing. Catheter length: 40 cm IMPRESSION: Successful right arm power PICC line placement with ultrasound and fluoroscopic guidance. The catheter is ready for use. Performed by: Wash Hack Electronically Signed   By: Erica Hau M.D.   On: 06/30/2023 09:52   DG Chest Port 1 View Result Date: 06/29/2023 CLINICAL DATA:  Chest pain. EXAM: PORTABLE CHEST 1 VIEW COMPARISON:  PET-CT dated 06/11/2023. FINDINGS: Low lung volumes. Mild bibasilar atelectasis. Known hypermetabolic circumferential esophageal mass is not well appreciated on this exam and better evaluated on the prior PET-CT dated 06/11/2023. No focal consolidation, sizeable pleural effusion, or pneumothorax. No acute osseous abnormality. IMPRESSION: 1. Low lung volumes.  Mild bibasilar atelectasis. 2. Known hypermetabolic circumferential esophageal mass is not well appreciated on this exam and better evaluated on the prior PET-CT dated 06/11/2023. Electronically Signed   By: Mannie Seek M.D.   On: 06/29/2023 12:54

## 2023-07-15 ENCOUNTER — Other Ambulatory Visit (HOSPITAL_COMMUNITY): Payer: Self-pay

## 2023-07-15 ENCOUNTER — Other Ambulatory Visit

## 2023-07-15 ENCOUNTER — Inpatient Hospital Stay (HOSPITAL_BASED_OUTPATIENT_CLINIC_OR_DEPARTMENT_OTHER): Admitting: Nurse Practitioner

## 2023-07-15 ENCOUNTER — Inpatient Hospital Stay

## 2023-07-15 ENCOUNTER — Other Ambulatory Visit: Payer: Self-pay

## 2023-07-15 ENCOUNTER — Inpatient Hospital Stay: Admitting: Dietician

## 2023-07-15 ENCOUNTER — Ambulatory Visit
Admission: RE | Admit: 2023-07-15 | Discharge: 2023-07-15 | Disposition: A | Discharge status: Home / Self Care | Source: Ambulatory Visit | Attending: Radiation Oncology

## 2023-07-15 VITALS — BP 100/76 | HR 79 | Temp 97.3°F | Resp 17 | Wt 240.4 lb

## 2023-07-15 DIAGNOSIS — Z510 Encounter for antineoplastic radiation therapy: Secondary | ICD-10-CM | POA: Diagnosis not present

## 2023-07-15 DIAGNOSIS — C153 Malignant neoplasm of upper third of esophagus: Secondary | ICD-10-CM

## 2023-07-15 DIAGNOSIS — Z452 Encounter for adjustment and management of vascular access device: Secondary | ICD-10-CM

## 2023-07-15 LAB — RAD ONC ARIA SESSION SUMMARY
Course Elapsed Days: 21
Plan Fractions Treated to Date: 15
Plan Prescribed Dose Per Fraction: 1.8 Gy
Plan Total Fractions Prescribed: 25
Plan Total Prescribed Dose: 45 Gy
Reference Point Dosage Given to Date: 27 Gy
Reference Point Session Dosage Given: 1.8 Gy
Session Number: 15

## 2023-07-15 LAB — CBC WITH DIFFERENTIAL (CANCER CENTER ONLY)
Abs Immature Granulocytes: 0.05 10*3/uL (ref 0.00–0.07)
Basophils Absolute: 0 10*3/uL (ref 0.0–0.1)
Basophils Relative: 1 %
Eosinophils Absolute: 0.1 10*3/uL (ref 0.0–0.5)
Eosinophils Relative: 2 %
HCT: 32 % — ABNORMAL LOW (ref 39.0–52.0)
Hemoglobin: 11.2 g/dL — ABNORMAL LOW (ref 13.0–17.0)
Immature Granulocytes: 1 %
Lymphocytes Relative: 17 %
Lymphs Abs: 0.7 10*3/uL (ref 0.7–4.0)
MCH: 29.5 pg (ref 26.0–34.0)
MCHC: 35 g/dL (ref 30.0–36.0)
MCV: 84.2 fL (ref 80.0–100.0)
Monocytes Absolute: 0.4 10*3/uL (ref 0.1–1.0)
Monocytes Relative: 10 %
Neutro Abs: 2.7 10*3/uL (ref 1.7–7.7)
Neutrophils Relative %: 69 %
Platelet Count: 260 10*3/uL (ref 150–400)
RBC: 3.8 MIL/uL — ABNORMAL LOW (ref 4.22–5.81)
RDW: 19.1 % — ABNORMAL HIGH (ref 11.5–15.5)
WBC Count: 3.9 10*3/uL — ABNORMAL LOW (ref 4.0–10.5)
nRBC: 1.8 % — ABNORMAL HIGH (ref 0.0–0.2)

## 2023-07-15 LAB — CMP (CANCER CENTER ONLY)
ALT: 17 U/L (ref 0–44)
AST: 16 U/L (ref 15–41)
Albumin: 3.9 g/dL (ref 3.5–5.0)
Alkaline Phosphatase: 113 U/L (ref 38–126)
Anion gap: 3 — ABNORMAL LOW (ref 5–15)
BUN: 18 mg/dL (ref 8–23)
CO2: 31 mmol/L (ref 22–32)
Calcium: 9.4 mg/dL (ref 8.9–10.3)
Chloride: 102 mmol/L (ref 98–111)
Creatinine: 0.76 mg/dL (ref 0.61–1.24)
GFR, Estimated: >60 mL/min (ref >60)
Glucose, Bld: 139 mg/dL — ABNORMAL HIGH (ref 70–99)
Potassium: 4.5 mmol/L (ref 3.5–5.1)
Sodium: 136 mmol/L (ref 135–145)
Total Bilirubin: 0.9 mg/dL (ref 0.0–1.2)
Total Protein: 7.2 g/dL (ref 6.5–8.1)

## 2023-07-15 MED ORDER — SODIUM CHLORIDE 0.9 % IV SOLN: INTRAVENOUS | Status: DC

## 2023-07-15 MED ORDER — HEPARIN SOD (PORK) LOCK FLUSH 100 UNIT/ML IV SOLN
500.0000 [IU] | Freq: Once | INTRAVENOUS | Status: AC
  Administered 2023-07-15: 500 [IU]

## 2023-07-15 MED ORDER — DIPHENHYDRAMINE HCL 50 MG/ML IJ SOLN
25.0000 mg | Freq: Once | INTRAMUSCULAR | Status: AC
  Administered 2023-07-15: 25 mg via INTRAVENOUS
  Filled 2023-07-15: qty 1

## 2023-07-15 MED ORDER — HYDROCODONE-ACETAMINOPHEN 7.5-325 MG/15ML PO SOLN
15.0000 mL | Freq: Four times a day (QID) | ORAL | 0 refills | Status: DC | PRN
  Filled 2023-07-15: qty 473, 8d supply, fill #0

## 2023-07-15 MED ORDER — SODIUM CHLORIDE 0.9% FLUSH
10.0000 mL | Freq: Once | INTRAVENOUS | Status: AC
  Administered 2023-07-15: 10 mL

## 2023-07-15 MED ORDER — PALONOSETRON HCL INJECTION 0.25 MG/5ML
0.2500 mg | Freq: Once | INTRAVENOUS | Status: AC
  Administered 2023-07-15: 0.25 mg via INTRAVENOUS
  Filled 2023-07-15: qty 5

## 2023-07-15 MED ORDER — SODIUM CHLORIDE 0.9 % IV SOLN
275.8000 mg | Freq: Once | INTRAVENOUS | Status: AC
  Administered 2023-07-15: 280 mg via INTRAVENOUS
  Filled 2023-07-15: qty 28

## 2023-07-15 MED ORDER — FAMOTIDINE IN NACL 20-0.9 MG/50ML-% IV SOLN
20.0000 mg | Freq: Once | INTRAVENOUS | Status: AC
  Administered 2023-07-15: 20 mg via INTRAVENOUS
  Filled 2023-07-15: qty 50

## 2023-07-15 MED ORDER — DEXAMETHASONE SODIUM PHOSPHATE 10 MG/ML IJ SOLN
10.0000 mg | Freq: Once | INTRAMUSCULAR | Status: AC
  Administered 2023-07-15: 10 mg via INTRAVENOUS
  Filled 2023-07-15: qty 1

## 2023-07-15 MED ORDER — SODIUM CHLORIDE 0.9 % IV SOLN
50.0000 mg/m2 | Freq: Once | INTRAVENOUS | Status: AC
  Administered 2023-07-15: 120 mg via INTRAVENOUS
  Filled 2023-07-15: qty 20

## 2023-07-15 NOTE — Progress Notes (Signed)
 Nutrition Follow-up:  Patient with esophageal cancer. Patient receiving concurrent chemoradiation therapy with weekly carboplatin  + paclitaxel  (first chemo 5/7).   S/p PEG 4/24 during hospital admission DME: [employer]  Met with patient in infusion. He reports sore throat. Pain managed with Hycet which was refilled for him today. Patient is doing baking soda salt water  gargles. He is tolerating tube feedings at goal. Patient getting up at 4AM to take protonix  on empty stomach. This has reduced reflux experienced with TF. He denies nausea, vomiting, diarrhea, constipation.    Medications: reviewed  Labs: glucose 139  Anthropometrics: Wt 240 lb 6.4 oz - increased   5/21 - 238 lb 8 oz 5/14 - 239 lb 14.4 oz 5/7 - 246 lb 12 oz 5/2 - 250 lb 6.4 oz 4/27 - 252 lb 6.8 oz   Estimated Energy Needs  Kcals: 2200-2400 Protein: 100-120 Fluid: >2.2 L  NUTRITION DIAGNOSIS: Food and nutrition related knowledge deficit improving    INTERVENTION:  Continue 6 cartons Nutren 1.5 Continue baking soda salt water  gargles    MONITORING, EVALUATION, GOAL: wt trends, TF   NEXT VISIT: Wednesday June 4 during infusion

## 2023-07-15 NOTE — Patient Instructions (Signed)
 [facility] - A DEPT OF [facility]  Discharge Instructions: Thank you for choosing [facility] to provide your oncology and hematology care.   If you have a lab appointment with the Cancer Center, please go directly to the Cancer Center and check in at the registration area.   Wear comfortable clothing and clothing appropriate for easy access to any Portacath or PICC line.   We strive to give you quality time with your provider. You may need to reschedule your appointment if you arrive late (15 or more minutes).  Arriving late affects you and other patients whose appointments are after yours.  Also, if you miss three or more appointments without notifying the office, you may be dismissed from the clinic at the provider's discretion.      For prescription refill requests, have your pharmacy contact our office and allow 72 hours for refills to be completed.    Today you received the following chemotherapy and/or immunotherapy agents paclitaxel, carboplatin      To help prevent nausea and vomiting after your treatment, we encourage you to take your nausea medication as directed.  BELOW ARE SYMPTOMS THAT SHOULD BE REPORTED IMMEDIATELY: *FEVER GREATER THAN 100.4 F (38 C) OR HIGHER *CHILLS OR SWEATING *NAUSEA AND VOMITING THAT IS NOT CONTROLLED WITH YOUR NAUSEA MEDICATION *UNUSUAL SHORTNESS OF BREATH *UNUSUAL BRUISING OR BLEEDING *URINARY PROBLEMS (pain or burning when urinating, or frequent urination) *BOWEL PROBLEMS (unusual diarrhea, constipation, pain near the anus) TENDERNESS IN MOUTH AND THROAT WITH OR WITHOUT PRESENCE OF ULCERS (sore throat, sores in mouth, or a toothache) UNUSUAL RASH, SWELLING OR PAIN  UNUSUAL VAGINAL DISCHARGE OR ITCHING   Items with * indicate a potential emergency and should be followed up as soon as possible or go to the Emergency Department if any problems should occur.  Please show the CHEMOTHERAPY ALERT CARD or  IMMUNOTHERAPY ALERT CARD at check-in to the Emergency Department and triage nurse.  Should you have questions after your visit or need to cancel or reschedule your appointment, please contact [facility] - A DEPT OF [facility]  Dept: [phone]  and follow the prompts.  Office hours are 8:00 a.m. to 4:30 p.m. Monday - Friday. Please note that voicemails left after 4:00 p.m. may not be returned until the following business day.  We are closed weekends and major holidays. You have access to a nurse at all times for urgent questions. Please call the main number to the clinic Dept: [phone] and follow the prompts.   For any non-urgent questions, you may also contact your provider using MyChart. We now offer e-Visits for anyone 44 and older to request care online for non-urgent symptoms. For details visit [email or web address].   Also download the MyChart app! Go to the app store, search "MyChart", open the app, select [facility], and log in with your MyChart username and password.

## 2023-07-16 ENCOUNTER — Ambulatory Visit
Admission: RE | Admit: 2023-07-16 | Discharge: 2023-07-16 | Disposition: A | Discharge status: Home / Self Care | Source: Ambulatory Visit | Attending: Radiation Oncology

## 2023-07-16 ENCOUNTER — Other Ambulatory Visit: Payer: Self-pay

## 2023-07-16 DIAGNOSIS — Z510 Encounter for antineoplastic radiation therapy: Secondary | ICD-10-CM | POA: Diagnosis not present

## 2023-07-16 LAB — RAD ONC ARIA SESSION SUMMARY
Course Elapsed Days: 22
Plan Fractions Treated to Date: 16
Plan Prescribed Dose Per Fraction: 1.8 Gy
Plan Total Fractions Prescribed: 25
Plan Total Prescribed Dose: 45 Gy
Reference Point Dosage Given to Date: 28.8 Gy
Reference Point Session Dosage Given: 1.8 Gy
Session Number: 16

## 2023-07-17 ENCOUNTER — Other Ambulatory Visit: Payer: Self-pay

## 2023-07-17 ENCOUNTER — Ambulatory Visit
Admission: RE | Admit: 2023-07-17 | Discharge: 2023-07-17 | Disposition: A | Discharge status: Home / Self Care | Source: Ambulatory Visit | Attending: Radiation Oncology | Admitting: Radiation Oncology

## 2023-07-17 DIAGNOSIS — Z510 Encounter for antineoplastic radiation therapy: Secondary | ICD-10-CM | POA: Diagnosis not present

## 2023-07-17 LAB — RAD ONC ARIA SESSION SUMMARY
Course Elapsed Days: 23
Plan Fractions Treated to Date: 17
Plan Prescribed Dose Per Fraction: 1.8 Gy
Plan Total Fractions Prescribed: 25
Plan Total Prescribed Dose: 45 Gy
Reference Point Dosage Given to Date: 30.6 Gy
Reference Point Session Dosage Given: 1.8 Gy
Session Number: 17

## 2023-07-19 ENCOUNTER — Encounter: Payer: Self-pay | Admitting: Nurse Practitioner

## 2023-07-19 ENCOUNTER — Encounter: Payer: Self-pay | Admitting: Hematology

## 2023-07-20 ENCOUNTER — Other Ambulatory Visit: Payer: Self-pay

## 2023-07-20 ENCOUNTER — Ambulatory Visit
Admission: RE | Admit: 2023-07-20 | Discharge: 2023-07-20 | Disposition: A | Discharge status: Home / Self Care | Source: Ambulatory Visit | Attending: Radiation Oncology | Admitting: Radiation Oncology

## 2023-07-20 DIAGNOSIS — C153 Malignant neoplasm of upper third of esophagus: Secondary | ICD-10-CM | POA: Insufficient documentation

## 2023-07-20 DIAGNOSIS — Z510 Encounter for antineoplastic radiation therapy: Secondary | ICD-10-CM | POA: Insufficient documentation

## 2023-07-20 LAB — RAD ONC ARIA SESSION SUMMARY
Course Elapsed Days: 26
Plan Fractions Treated to Date: 18
Plan Prescribed Dose Per Fraction: 1.8 Gy
Plan Total Fractions Prescribed: 25
Plan Total Prescribed Dose: 45 Gy
Reference Point Dosage Given to Date: 32.4 Gy
Reference Point Session Dosage Given: 1.8 Gy
Session Number: 18

## 2023-07-21 ENCOUNTER — Other Ambulatory Visit: Payer: Self-pay

## 2023-07-21 ENCOUNTER — Ambulatory Visit
Admission: RE | Admit: 2023-07-21 | Discharge: 2023-07-21 | Disposition: A | Discharge status: Home / Self Care | Source: Ambulatory Visit | Attending: Radiation Oncology

## 2023-07-21 ENCOUNTER — Other Ambulatory Visit: Payer: Self-pay | Admitting: Hematology

## 2023-07-21 DIAGNOSIS — Z510 Encounter for antineoplastic radiation therapy: Secondary | ICD-10-CM | POA: Diagnosis not present

## 2023-07-21 LAB — RAD ONC ARIA SESSION SUMMARY
Course Elapsed Days: 27
Plan Fractions Treated to Date: 19
Plan Prescribed Dose Per Fraction: 1.8 Gy
Plan Total Fractions Prescribed: 25
Plan Total Prescribed Dose: 45 Gy
Reference Point Dosage Given to Date: 34.2 Gy
Reference Point Session Dosage Given: 1.8 Gy
Session Number: 19

## 2023-07-21 MED ORDER — FAMOTIDINE 40 MG/5ML PO SUSR: 20.0000 mg | Freq: Every day | ORAL | 1 refills | Status: DC

## 2023-07-21 NOTE — Assessment & Plan Note (Signed)
-  cTxN1M0, invasive squamous cell carcinoma -Present with progressive dysphagia since December 2024. -EGD on 05/27/2023 which showed a mass in the proximal esophagus, 2 to 3 cm below introitus.  Biopsy confirmed invasive squamous cell carcinoma.  -PET scan showed hypermetabolic lesion in the proximal esophagus, with a positive peritracheal lymph node.  No distant metastasis -Status post PEG feeding tube placement  -He started chemoRT on 06/24/2023 -ED visit for chest pain on 06/29/2023

## 2023-07-22 ENCOUNTER — Inpatient Hospital Stay: Attending: Hematology

## 2023-07-22 ENCOUNTER — Inpatient Hospital Stay

## 2023-07-22 ENCOUNTER — Ambulatory Visit: Admitting: Dietician

## 2023-07-22 ENCOUNTER — Other Ambulatory Visit: Payer: Self-pay

## 2023-07-22 ENCOUNTER — Ambulatory Visit
Admission: RE | Admit: 2023-07-22 | Discharge: 2023-07-22 | Disposition: A | Discharge status: Home / Self Care | Source: Ambulatory Visit | Attending: Radiation Oncology | Admitting: Radiation Oncology

## 2023-07-22 ENCOUNTER — Encounter: Payer: Self-pay | Admitting: Hematology

## 2023-07-22 ENCOUNTER — Other Ambulatory Visit

## 2023-07-22 ENCOUNTER — Inpatient Hospital Stay (HOSPITAL_BASED_OUTPATIENT_CLINIC_OR_DEPARTMENT_OTHER): Admitting: Hematology

## 2023-07-22 VITALS — BP 104/76 | HR 91 | Temp 97.2°F | Resp 15 | Ht 74.0 in | Wt 238.8 lb

## 2023-07-22 DIAGNOSIS — Z79899 Other long term (current) drug therapy: Secondary | ICD-10-CM | POA: Insufficient documentation

## 2023-07-22 DIAGNOSIS — Z510 Encounter for antineoplastic radiation therapy: Secondary | ICD-10-CM | POA: Diagnosis not present

## 2023-07-22 DIAGNOSIS — C153 Malignant neoplasm of upper third of esophagus: Secondary | ICD-10-CM | POA: Insufficient documentation

## 2023-07-22 DIAGNOSIS — Z5111 Encounter for antineoplastic chemotherapy: Secondary | ICD-10-CM | POA: Insufficient documentation

## 2023-07-22 DIAGNOSIS — Z452 Encounter for adjustment and management of vascular access device: Secondary | ICD-10-CM

## 2023-07-22 LAB — RAD ONC ARIA SESSION SUMMARY
Course Elapsed Days: 28
Plan Fractions Treated to Date: 20
Plan Prescribed Dose Per Fraction: 1.8 Gy
Plan Total Fractions Prescribed: 25
Plan Total Prescribed Dose: 45 Gy
Reference Point Dosage Given to Date: 36 Gy
Reference Point Session Dosage Given: 1.8 Gy
Session Number: 20

## 2023-07-22 LAB — CMP (CANCER CENTER ONLY)
ALT: 17 U/L (ref 0–44)
AST: 17 U/L (ref 15–41)
Albumin: 3.8 g/dL (ref 3.5–5.0)
Alkaline Phosphatase: 116 U/L (ref 38–126)
Anion gap: 3 — ABNORMAL LOW (ref 5–15)
BUN: 17 mg/dL (ref 8–23)
CO2: 31 mmol/L (ref 22–32)
Calcium: 9.1 mg/dL (ref 8.9–10.3)
Chloride: 103 mmol/L (ref 98–111)
Creatinine: 0.74 mg/dL (ref 0.61–1.24)
GFR, Estimated: >60 mL/min (ref >60)
Glucose, Bld: 155 mg/dL — ABNORMAL HIGH (ref 70–99)
Potassium: 4.1 mmol/L (ref 3.5–5.1)
Sodium: 137 mmol/L (ref 135–145)
Total Bilirubin: 1 mg/dL (ref 0.0–1.2)
Total Protein: 6.9 g/dL (ref 6.5–8.1)

## 2023-07-22 LAB — CBC WITH DIFFERENTIAL (CANCER CENTER ONLY)
Abs Immature Granulocytes: 0.02 10*3/uL (ref 0.00–0.07)
Basophils Absolute: 0 10*3/uL (ref 0.0–0.1)
Basophils Relative: 1 %
Eosinophils Absolute: 0.1 10*3/uL (ref 0.0–0.5)
Eosinophils Relative: 3 %
HCT: 29.6 % — ABNORMAL LOW (ref 39.0–52.0)
Hemoglobin: 10.3 g/dL — ABNORMAL LOW (ref 13.0–17.0)
Immature Granulocytes: 1 %
Lymphocytes Relative: 16 %
Lymphs Abs: 0.5 10*3/uL — ABNORMAL LOW (ref 0.7–4.0)
MCH: 29.7 pg (ref 26.0–34.0)
MCHC: 34.8 g/dL (ref 30.0–36.0)
MCV: 85.3 fL (ref 80.0–100.0)
Monocytes Absolute: 0.2 10*3/uL (ref 0.1–1.0)
Monocytes Relative: 5 %
Neutro Abs: 2.4 10*3/uL (ref 1.7–7.7)
Neutrophils Relative %: 74 %
Platelet Count: 154 10*3/uL (ref 150–400)
RBC: 3.47 MIL/uL — ABNORMAL LOW (ref 4.22–5.81)
RDW: 20 % — ABNORMAL HIGH (ref 11.5–15.5)
WBC Count: 3.2 10*3/uL — ABNORMAL LOW (ref 4.0–10.5)
nRBC: 2.8 % — ABNORMAL HIGH (ref 0.0–0.2)

## 2023-07-22 MED ORDER — SODIUM CHLORIDE 0.9 % IV SOLN: INTRAVENOUS | Status: DC

## 2023-07-22 MED ORDER — PALONOSETRON HCL INJECTION 0.25 MG/5ML
0.2500 mg | Freq: Once | INTRAVENOUS | Status: AC
  Administered 2023-07-22: 0.25 mg via INTRAVENOUS
  Filled 2023-07-22: qty 5

## 2023-07-22 MED ORDER — SODIUM CHLORIDE 0.9% FLUSH
10.0000 mL | Freq: Once | INTRAVENOUS | Status: AC
  Administered 2023-07-22: 10 mL

## 2023-07-22 MED ORDER — NYSTATIN 100000 UNIT/ML MT SUSP: 10.0000 mL | Freq: Four times a day (QID) | OROMUCOSAL | 0 refills | Status: DC

## 2023-07-22 MED ORDER — SODIUM CHLORIDE 0.9 % IV SOLN
50.0000 mg/m2 | Freq: Once | INTRAVENOUS | Status: AC
  Administered 2023-07-22: 120 mg via INTRAVENOUS
  Filled 2023-07-22: qty 20

## 2023-07-22 MED ORDER — DIPHENHYDRAMINE HCL 50 MG/ML IJ SOLN
25.0000 mg | Freq: Once | INTRAMUSCULAR | Status: AC
  Administered 2023-07-22: 25 mg via INTRAVENOUS
  Filled 2023-07-22: qty 1

## 2023-07-22 MED ORDER — SODIUM CHLORIDE 0.9 % IV SOLN
275.8000 mg | Freq: Once | INTRAVENOUS | Status: AC
  Administered 2023-07-22: 280 mg via INTRAVENOUS
  Filled 2023-07-22: qty 28

## 2023-07-22 MED ORDER — DEXAMETHASONE SODIUM PHOSPHATE 10 MG/ML IJ SOLN
10.0000 mg | Freq: Once | INTRAMUSCULAR | Status: AC
  Administered 2023-07-22: 10 mg via INTRAVENOUS
  Filled 2023-07-22: qty 1

## 2023-07-22 MED ORDER — FAMOTIDINE IN NACL 20-0.9 MG/50ML-% IV SOLN
20.0000 mg | Freq: Once | INTRAVENOUS | Status: AC
  Administered 2023-07-22: 20 mg via INTRAVENOUS
  Filled 2023-07-22: qty 50

## 2023-07-22 NOTE — Patient Instructions (Signed)
 [facility] - A DEPT OF [facility]  Discharge Instructions: Thank you for choosing [facility] to provide your oncology and hematology care.   If you have a lab appointment with the Cancer Center, please go directly to the Cancer Center and check in at the registration area.   Wear comfortable clothing and clothing appropriate for easy access to any Portacath or PICC line.   We strive to give you quality time with your provider. You may need to reschedule your appointment if you arrive late (15 or more minutes).  Arriving late affects you and other patients whose appointments are after yours.  Also, if you miss three or more appointments without notifying the office, you may be dismissed from the clinic at the provider's discretion.      For prescription refill requests, have your pharmacy contact our office and allow 72 hours for refills to be completed.    Today you received the following chemotherapy and/or immunotherapy agents paclitaxel, carboplatin      To help prevent nausea and vomiting after your treatment, we encourage you to take your nausea medication as directed.  BELOW ARE SYMPTOMS THAT SHOULD BE REPORTED IMMEDIATELY: *FEVER GREATER THAN 100.4 F (38 C) OR HIGHER *CHILLS OR SWEATING *NAUSEA AND VOMITING THAT IS NOT CONTROLLED WITH YOUR NAUSEA MEDICATION *UNUSUAL SHORTNESS OF BREATH *UNUSUAL BRUISING OR BLEEDING *URINARY PROBLEMS (pain or burning when urinating, or frequent urination) *BOWEL PROBLEMS (unusual diarrhea, constipation, pain near the anus) TENDERNESS IN MOUTH AND THROAT WITH OR WITHOUT PRESENCE OF ULCERS (sore throat, sores in mouth, or a toothache) UNUSUAL RASH, SWELLING OR PAIN  UNUSUAL VAGINAL DISCHARGE OR ITCHING   Items with * indicate a potential emergency and should be followed up as soon as possible or go to the Emergency Department if any problems should occur.  Please show the CHEMOTHERAPY ALERT CARD or  IMMUNOTHERAPY ALERT CARD at check-in to the Emergency Department and triage nurse.  Should you have questions after your visit or need to cancel or reschedule your appointment, please contact [facility] - A DEPT OF [facility]  Dept: [phone]  and follow the prompts.  Office hours are 8:00 a.m. to 4:30 p.m. Monday - Friday. Please note that voicemails left after 4:00 p.m. may not be returned until the following business day.  We are closed weekends and major holidays. You have access to a nurse at all times for urgent questions. Please call the main number to the clinic Dept: [phone] and follow the prompts.   For any non-urgent questions, you may also contact your provider using MyChart. We now offer e-Visits for anyone 44 and older to request care online for non-urgent symptoms. For details visit [email or web address].   Also download the MyChart app! Go to the app store, search "MyChart", open the app, select [facility], and log in with your MyChart username and password.

## 2023-07-22 NOTE — Progress Notes (Signed)
 [facility]   [phone] Fax:([phone]) [phone]   Clinic Follow up Note   Patient Care Team: Lorella Roles, MD as PCP - General (Family Medicine) Olinda Bertrand, DO as PCP - Cardiology (Cardiology) Clance, Deena Farrier, MD as Referring Physician (Pulmonary Disease) Almeda Jacobs, MD as Consulting Physician (Hematology and Oncology) Colie Dawes, MD as Attending Physician (Radiation Oncology) Sonja Madrid, MD as Consulting Physician (Hematology and Oncology)  Date of Service:  07/22/2023  CHIEF COMPLAINT: f/u of esophageal cancer  CURRENT THERAPY:  concurrent chemoradiation with weekly carboplatin  and paclitaxel   Oncology History   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 -EGD on 05/27/2023 which showed a mass in the proximal esophagus, 2 to 3 cm below introitus.  Biopsy confirmed invasive squamous cell carcinoma.  -PET scan showed hypermetabolic lesion in the proximal esophagus, with a positive peritracheal lymph node.  No distant metastasis -Status post PEG feeding tube placement  -He started chemoRT on 06/24/2023 -ED visit for chest pain on 06/29/2023  Assessment & Plan Esophageal cancer Undergoing radiation therapy, dependent on a feeding tube for nutrition and hydration. Reports improved swallowing but remains cautious with solid foods until post-treatment. Occasional coughing with white phlegm, denies chest pain. Radiation treatment nearing completion with the last session on June 16. No radiation-induced skin issues. - Continue radiation therapy, last session on June 16. - Evaluate swallowing function with a speech therapist on June 16.  Feeding tube dependence Dependent on a feeding tube due to esophageal cancer. Adequate hydration through the tube, no issues with the site. Cautious about oral intake until treatment completion. - Maintain feeding  tube nutrition and hydration.  Unintentional weight loss Significant weight loss from 300 to 238 pounds, stabilized since May 21.  Plan - He is clinically doing better, will continue concurrent chemoradiation - Lab reviewed, adequate for treatment, will proceed a week 5 chemo today - Follow-up next week for last cycle chemo - Will refill nystatin  for oral thrush   SUMMARY OF ONCOLOGIC HISTORY: Oncology History  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HCC)  06/14/2013 Procedure   Biopsy of the tonsil confirmrf squamous cell carcinoma. 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  History of Present Illness Champion Melvin Hudson is a 68 year old male with esophageal cancer who presents for follow-up.  He is undergoing radiation therapy and primarily uses a feeding tube for nutrition and hydration, though he can drink small amounts of liquids. He has lost significant weight, from approximately 300 pounds to 238 pounds as of Jul 08, 2023. He avoids solid foods  and plans to wait until after treatment to attempt them.  He can swallow liquids without pain, although he sometimes does not go down smoothly.  He experiences a cough with white phlegm production, but it is not excessive. He is able to walk around and perform usual activities, feeling better than before.  He is taking all prescribed medications, including those for nausea.     All other systems were reviewed with the patient and are negative.  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    SURGICAL HISTORY: Past Surgical History:  Procedure Laterality Date   COLONOSCOPY Left 04/11/2014   Procedure: COLONOSCOPY;  Surgeon: Evangeline Hilts, MD;  Location: [facility] ENDOSCOPY;  Service: Endoscopy;  Laterality: Left;   COLONOSCOPY N/A 04/20/2014   Procedure: COLONOSCOPY;  Surgeon: Yvetta Herbert, MD;  Location: [facility] ENDOSCOPY;  Service: Endoscopy;  Laterality: N/A;   COLONOSCOPY W/ BIOPSIES AND POLYPECTOMY     benign   DIRECT LARYNGOSCOPY  01/05/2014   DIRECT LARYNGOSCOPY N/A 01/05/2014   Procedure: DIRECT LARYNGOSCOPY AND BIOPSY;  Surgeon: Janita Mellow, MD;  Location: [facility] OR;  Service: ENT;  Laterality: N/A;   ESOPHAGOSCOPY WITH DILITATION N/A 05/27/2023   Procedure: ESOPHAGOSCOPY;  Surgeon: Janita Mellow, MD;  Location: [facility] OR;  Service: ENT;  Laterality: N/A;   IR GASTROSTOMY TUBE MOD SED  06/12/2023   KNEE ARTHROSCOPY Right ~ 1994   LEFT AND RIGHT HEART CATHETERIZATION WITH CORONARY/GRAFT ANGIOGRAM N/A 12/31/2010   Procedure: LEFT AND RIGHT HEART CATHETERIZATION WITH Estella Helling;  Surgeon: Jessica Morn, MD;  Location: [facility]  CATH LAB;  Service: Cardiovascular;  Laterality: N/A;   MULTIPLE EXTRACTIONS WITH ALVEOLOPLASTY N/A 07/08/2013   Procedure: Extraction of tooth #'s 22, 27 with alveoloplasty and bilateal mandibular facial exostoses reductions;  Surgeon: Carol Chroman, DDS;  Location: [facility] ORS;  Service: Oral Surgery;  Laterality: N/A;   MULTIPLE TOOTH EXTRACTIONS  ~ 2008    I have reviewed the social history and family history with the patient and they are unchanged from previous note.  ALLERGIES:  is allergic to azilsartan and advil [ibuprofen].  MEDICATIONS:  Current Outpatient Medications  Medication Sig Dispense Refill   albuterol  (VENTOLIN  HFA) 108 (90 Base) MCG/ACT inhaler Inhale 2 puffs into the lungs every 4 (four) hours as needed for wheezing (Cough).     allopurinol  (ZYLOPRIM ) 300 MG tablet Take 300 mg by mouth every other day.     apixaban  (ELIQUIS ) 5 MG TABS tablet Take 1 tablet (5 mg total) by mouth 2 (two) times daily. 60 tablet 1   APIXABAN  (ELIQUIS ) VTE STARTER PACK (10MG  AND 5MG ) Take as directed on package: start with two-5mg  tablets twice daily for 7 days. On day 8, switch to one-5mg  tablet twice daily. 74 each 0   bismuth subsalicylate (PEPTO BISMOL) 262 MG/15ML suspension Take 30 mLs by mouth every 6 (six) hours as needed for indigestion or diarrhea  or loose stools.     Blood Glucose Monitoring Suppl (ONETOUCH VERIO FLEX SYSTEM) w/Device KIT USE TO CHECK BLOOD SUGAR TWICE DAILY     calcium  carbonate (TUMS - DOSED IN MG ELEMENTAL CALCIUM ) 500 MG chewable tablet Chew 2 tablets (400 mg of elemental calcium  total) by mouth 3 (three) times daily with meals. (Patient taking differently: Chew 400 mg of elemental calcium  by mouth daily as needed.) 180 tablet 1   carvedilol  (COREG ) 3.125 MG tablet Take 3.125 mg by mouth 2 (two) times daily with a meal.     clonazePAM  (KLONOPIN ) 1 MG disintegrating tablet Take 1 mg by mouth daily.     diphenhydrAMINE  (BENADRYL ) 25 MG tablet Take 1 tablet (25 mg  total) by mouth every 6 (six) hours. (Patient taking differently: Take 25 mg by mouth every 6 (six) hours as needed for allergies or sleep.) 20 tablet 0   famotidine  (PEPCID ) 40 MG/5ML suspension Place 2.5 mLs (20 mg total) into feeding tube daily. 50 mL 1   gabapentin  (NEURONTIN ) 300 MG capsule Take 300 mg by mouth 3 (three) times daily.   2   HYDROcodone -acetaminophen  (HYCET) 7.5-325 mg/15 ml solution Take 15 mLs by mouth 4 (four) times daily as needed for moderate pain (pain score 4-6). 473 mL 0   hydrocortisone  (ANUSOL -HC) 2.5 % rectal cream Place 1 Application rectally daily as needed for hemorrhoids or anal itching.     isosorbide -hydrALAZINE  (BIDIL ) 20-37.5 MG tablet Take 1 tablet by mouth in the morning and at bedtime. 90 tablet 3   lactose free nutrition (BOOST) LIQD Take 237 mLs by mouth daily.     lidocaine  (XYLOCAINE ) 2 % solution Patient: Mix 1part 2% viscous lidocaine , 1part H20. Swallow 10mL of diluted mixture, before meals and at bedtime, up to QID 200 mL 3   LORazepam  (ATIVAN ) 0.5 MG tablet Take 1 tablet PRN claustrophobia 20 minutes prior to radiation. Will need a driver. Use only as needed. 5 tablet 0   omeprazole  (PRILOSEC) 20 MG capsule Take 20 mg by mouth daily.   4   ondansetron  (ZOFRAN ) 8 MG tablet Take 1 tablet (8 mg total) by mouth every 8 (eight) hours as needed for nausea or vomiting. Start on the third day after chemotherapy. 30 tablet 1   ONETOUCH VERIO test strip USE TO CHECK BLOOD SUGAR TWICE DAILY     polyethylene glycol (MIRALAX  / GLYCOLAX ) 17 g packet Take 17 g by mouth daily. (Patient taking differently: Take 17 g by mouth daily as needed.) 14 each 0   prochlorperazine  (COMPAZINE ) 10 MG tablet Take 1 tablet (10 mg total) by mouth every 6 (six) hours as needed for nausea or vomiting. 30 tablet 1   Protein (FEEDING SUPPLEMENT, PROSOURCE TF20,) liquid Place 60 mLs into feeding tube 2 (two) times daily.     psyllium (HYDROCIL/METAMUCIL) 95 % PACK Take 1 packet by  mouth daily. (Patient taking differently: Take 1 packet by mouth daily as needed for mild constipation or moderate constipation.) 240 each 0   senna-docusate (SENOKOT-S) 8.6-50 MG tablet Take 1 tablet by mouth 2 (two) times daily. (Patient taking differently: Take 1 tablet by mouth daily as needed for mild constipation or moderate constipation.) 60 tablet 1   Water  For Irrigation, Sterile (FREE WATER ) SOLN Place 100 mLs into feeding tube every 6 (six) hours.     atorvastatin  (LIPITOR) 40 MG tablet Take 1 tablet (40 mg total) by mouth at bedtime. (Patient taking differently: Take 40 mg by mouth in the  morning.) 90 tablet 1   nystatin  (MYCOSTATIN ) 100000 UNIT/ML suspension Take 10 mLs (1,000,000 Units total) by mouth 4 (four) times daily. For oral thrush. Stop when thrush resolves 473 mL 0   No current facility-administered medications for this visit.   Facility-Administered Medications Ordered in Other Visits  Medication Dose Route Frequency Provider Last Rate Last Admin   0.9 %  sodium chloride  infusion   Intravenous Continuous Sonja [place], MD 10 mL/hr at 07/22/23 1023 New Bag at 07/22/23 1023   CARBOplatin  (PARAPLATIN ) 280 mg in sodium chloride  0.9 % 100 mL chemo infusion  280 mg Intravenous Once Sonja Gerald, MD       PACLitaxel  (TAXOL ) 120 mg in sodium chloride  0.9 % 250 mL chemo infusion (</= 80mg /m2)  50 mg/m2 (Treatment Plan Recorded) Intravenous Once Sonja Harrah, MD 270 mL/hr at 07/22/23 1156 120 mg at 07/22/23 1156    PHYSICAL EXAMINATION: ECOG PERFORMANCE STATUS: 2 - Symptomatic, <50% confined to bed  Vitals:   07/22/23 0944  BP: 104/76  Pulse: 91  Resp: 15  Temp: (!) 97.2 F (36.2 C)  SpO2: 99%   Wt Readings from Last 3 Encounters:  07/22/23 238 lb 12.8 oz (108.3 kg)  07/15/23 240 lb 6.4 oz (109 kg)  07/08/23 238 lb 8 oz (108.2 kg)     GENERAL:alert, no distress and comfortable SKIN: skin color, texture, turgor are normal, no rashes or significant lesions EYES: normal,  Conjunctiva are pink and non-injected, sclera clear NECK: supple, thyroid  normal size, non-tender, without nodularity LYMPH:  no palpable lymphadenopathy in the cervical, axillary  LUNGS: clear to auscultation and percussion with normal breathing effort HEART: regular rate & rhythm and no murmurs and no lower extremity edema ABDOMEN:abdomen soft, non-tender and normal bowel sounds Musculoskeletal:no cyanosis of digits and no clubbing  NEURO: alert & oriented x 3 with fluent speech, no focal motor/sensory deficits  Physical Exam MEASUREMENTS: Weight- 238.  LABORATORY DATA:  I have reviewed the data as listed    Latest Ref Rng & Units 07/22/2023    8:29 AM 07/15/2023    8:14 AM 07/08/2023    9:53 AM  CBC  WBC 4.0 - 10.5 K/uL 3.2  3.9  5.6   Hemoglobin 13.0 - 17.0 g/dL 44.0  10.2  72.5   Hematocrit 39.0 - 52.0 % 29.6  32.0  34.0   Platelets 150 - 400 K/uL 154  260  335         Latest Ref Rng & Units 07/22/2023    8:29 AM 07/15/2023    8:14 AM 07/08/2023    9:53 AM  CMP  Glucose 70 - 99 mg/dL 366  440  347   BUN 8 - 23 mg/dL 17  18  20    Creatinine 0.61 - 1.24 mg/dL 4.25  9.56  3.87   Sodium 135 - 145 mmol/L 137  136  138   Potassium 3.5 - 5.1 mmol/L 4.1  4.5  4.3   Chloride 98 - 111 mmol/L 103  102  103   CO2 22 - 32 mmol/L 31  31  30    Calcium  8.9 - 10.3 mg/dL 9.1  9.4  9.5   Total Protein 6.5 - 8.1 g/dL 6.9  7.2  7.5   Total Bilirubin 0.0 - 1.2 mg/dL 1.0  0.9  1.1   Alkaline Phos 38 - 126 U/L 116  113  125   AST 15 - 41 U/L 17  16  18    ALT 0 - 44 U/L  17  17  21        RADIOGRAPHIC STUDIES: I have personally reviewed the radiological images as listed and agreed with the findings in the report. No results found.    No orders of the defined types were placed in this encounter.  All questions were answered. The patient knows to call the clinic with any problems, questions or concerns. No barriers to learning was detected. The total time spent in the appointment was 25  minutes, including review of chart and various tests results, discussions about plan of care and coordination of care plan     Sonja Lakeside, MD 07/22/2023

## 2023-07-23 ENCOUNTER — Other Ambulatory Visit: Payer: Self-pay

## 2023-07-23 ENCOUNTER — Encounter: Payer: Self-pay | Admitting: Hematology

## 2023-07-23 ENCOUNTER — Ambulatory Visit
Admission: RE | Admit: 2023-07-23 | Discharge: 2023-07-23 | Disposition: A | Discharge status: Home / Self Care | Source: Ambulatory Visit | Attending: Radiation Oncology

## 2023-07-23 DIAGNOSIS — Z510 Encounter for antineoplastic radiation therapy: Secondary | ICD-10-CM | POA: Diagnosis not present

## 2023-07-23 LAB — RAD ONC ARIA SESSION SUMMARY
Course Elapsed Days: 29
Plan Fractions Treated to Date: 21
Plan Prescribed Dose Per Fraction: 1.8 Gy
Plan Total Fractions Prescribed: 25
Plan Total Prescribed Dose: 45 Gy
Reference Point Dosage Given to Date: 37.8 Gy
Reference Point Session Dosage Given: 1.8 Gy
Session Number: 21

## 2023-07-23 NOTE — Progress Notes (Signed)
 Nutrition Follow-up:  Patient with esophageal cancer. Patient receiving concurrent chemoradiation therapy with weekly carboplatin  + paclitaxel  (first chemo 5/7).   S/p PEG 4/24 during hospital admission DME: [employer]  Met with patient in infusion. He is giving bolus feed at visit. Patient reports doing well overall. He has been able to tolerate small sips of coffee and an icy which excites him. Patient denies coughing with swallow of thin liquid. He is not eating orally, although he is looking forward to this day. Says he has ribs in the freezer waiting on him. Patient is tolerating 5-6 cartons of Nutren 1.5. Patient denies nausea, vomiting, diarrhea, constipation.    Medications: reviewed   Labs: glucose 155  Anthropometrics: Wt 238 lb 12.8 oz today - stable  5/28 - 240 lb 6.4 oz  5/21 - 238 lb 8 oz 5/14 - 239 lb 14.4 oz 5/7 - 246 lb 12 oz 5/2 - 250 lb 6.4 oz 4/27 - 252 lb 6.8 oz    Estimated Energy Needs   Kcals: 2200-2400 Protein: 100-120 Fluid: >2.2 L   NUTRITION DIAGNOSIS: Food and nutrition related knowledge deficit - improving    INTERVENTION:  Encourage giving 6 cartons Nutren 1.5 to meet daily nutrition needs Continue baking soda salt water  gargles  Diet advancement per SLP    MONITORING, EVALUATION, GOAL: wt trends, intake, TF   NEXT VISIT: Wednesday June 11 during infusion

## 2023-07-24 ENCOUNTER — Other Ambulatory Visit: Payer: Self-pay

## 2023-07-24 ENCOUNTER — Ambulatory Visit
Admission: RE | Admit: 2023-07-24 | Discharge: 2023-07-24 | Disposition: A | Discharge status: Home / Self Care | Source: Ambulatory Visit | Attending: Radiation Oncology | Admitting: Radiation Oncology

## 2023-07-24 ENCOUNTER — Encounter: Payer: Self-pay | Admitting: Hematology

## 2023-07-24 DIAGNOSIS — Z510 Encounter for antineoplastic radiation therapy: Secondary | ICD-10-CM | POA: Diagnosis not present

## 2023-07-24 LAB — RAD ONC ARIA SESSION SUMMARY
Course Elapsed Days: 30
Plan Fractions Treated to Date: 22
Plan Prescribed Dose Per Fraction: 1.8 Gy
Plan Total Fractions Prescribed: 25
Plan Total Prescribed Dose: 45 Gy
Reference Point Dosage Given to Date: 39.6 Gy
Reference Point Session Dosage Given: 1.8 Gy
Session Number: 22

## 2023-07-27 ENCOUNTER — Other Ambulatory Visit: Payer: Self-pay | Admitting: Nurse Practitioner

## 2023-07-27 ENCOUNTER — Other Ambulatory Visit: Payer: Self-pay

## 2023-07-27 ENCOUNTER — Ambulatory Visit
Admission: RE | Admit: 2023-07-27 | Discharge: 2023-07-27 | Disposition: A | Discharge status: Home / Self Care | Source: Ambulatory Visit | Attending: Radiation Oncology | Admitting: Radiation Oncology

## 2023-07-27 ENCOUNTER — Other Ambulatory Visit: Payer: Self-pay | Admitting: Hematology

## 2023-07-27 ENCOUNTER — Other Ambulatory Visit (HOSPITAL_COMMUNITY): Payer: Self-pay

## 2023-07-27 DIAGNOSIS — Z510 Encounter for antineoplastic radiation therapy: Secondary | ICD-10-CM | POA: Diagnosis not present

## 2023-07-27 LAB — RAD ONC ARIA SESSION SUMMARY
Course Elapsed Days: 33
Plan Fractions Treated to Date: 23
Plan Prescribed Dose Per Fraction: 1.8 Gy
Plan Total Fractions Prescribed: 25
Plan Total Prescribed Dose: 45 Gy
Reference Point Dosage Given to Date: 41.4 Gy
Reference Point Session Dosage Given: 1.8 Gy
Session Number: 23

## 2023-07-27 MED ORDER — HYDROCODONE-ACETAMINOPHEN 7.5-325 MG/15ML PO SOLN
15.0000 mL | Freq: Four times a day (QID) | ORAL | 0 refills | Status: DC | PRN
  Filled 2023-07-27: qty 473, 8d supply, fill #0

## 2023-07-28 ENCOUNTER — Ambulatory Visit
Admission: RE | Admit: 2023-07-28 | Discharge: 2023-07-28 | Disposition: A | Discharge status: Home / Self Care | Source: Ambulatory Visit | Attending: Radiation Oncology | Admitting: Radiation Oncology

## 2023-07-28 ENCOUNTER — Other Ambulatory Visit: Payer: Self-pay

## 2023-07-28 DIAGNOSIS — Z510 Encounter for antineoplastic radiation therapy: Secondary | ICD-10-CM | POA: Diagnosis not present

## 2023-07-28 LAB — RAD ONC ARIA SESSION SUMMARY
Course Elapsed Days: 34
Plan Fractions Treated to Date: 24
Plan Prescribed Dose Per Fraction: 1.8 Gy
Plan Total Fractions Prescribed: 25
Plan Total Prescribed Dose: 45 Gy
Reference Point Dosage Given to Date: 43.2 Gy
Reference Point Session Dosage Given: 1.8 Gy
Session Number: 24

## 2023-07-28 NOTE — Assessment & Plan Note (Addendum)
 cTxN1M0, invasive squamous cell carcinoma -Present with progressive dysphagia since December 2024. -EGD on 05/27/2023 which showed a mass in the proximal esophagus, 2 to 3 cm below introitus.  Biopsy confirmed invasive squamous cell carcinoma.  -PET scan showed hypermetabolic lesion in the proximal esophagus, with a positive peritracheal lymph node.  No distant metastasis -Status post PEG feeding tube placement  -He started chemoRT on 06/24/2023 -ED visit for chest pain on 06/29/2023 07/29/2023 - Final dose chemotherapy today.  Dose held due to severe neutropenia and persistent anemia.  Will reassess in 1 week to ensure improvement.  Continue PICC line flushes and dressing changes weekly. Scheduled for final dose radiation 08/03/2023.

## 2023-07-28 NOTE — Progress Notes (Signed)
 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 -EGD on 05/27/2023 which showed a mass in the proximal esophagus, 2 to 3 cm below introitus.  Biopsy confirmed invasive squamous cell carcinoma.  -PET scan showed hypermetabolic lesion in the proximal esophagus, with a positive peritracheal lymph node.  No distant metastasis -Status post PEG feeding tube placement  -He started chemoRT on 06/24/2023 -ED visit for chest pain on 06/29/2023 07/29/2023 - Final dose chemotherapy today.  Dose held due to severe neutropenia and persistent anemia.  Will reassess in 1 week to ensure improvement.  Continue PICC line flushes and dressing changes weekly. Scheduled for final dose radiation 08/03/2023.    Neutropenia and anemia Today, CBC shows WBC 1.5 with ANC 0.9.  His Hgb is 9.4 and HCT are 26.3.  Due to significant worsening of neutropenia, discussed this case with Dr. Autumn.  It was decided to hold his final dose of chemotherapy with carboplatin  and paclitaxel .  He has completed 5 rounds of chemotherapy, often this is enough.  Will repeat his labs in 1 week with follow-up appointment.  Thrush Has noticed slight improvement in plaques of thrush in the mouth and on mucous membranes.  Continue to use nystatin  solution.  Will refill as indicated.  Constipation due to pain medication Patient does take pain meds (narcotic)  on routine basis to manage his pain.  These medications are causing constipation.  He does use MiraLAX  and Senokot to manage constipation.  Dysphagia Patient has noticed an improvement in difficulty swallowing.  He is able to handle his oral secretions and tolerate some oral liquids much better.  Still not eating anything solid.  Using his feeding tube as main source of nutrition.  Plan Labs reviewed. - Due to significantly worse neutropenia, will hold final dose chemotherapy with carboplatin  and paclitaxel .  Will recheck labs in 1 week to ensure recovery. -CMP is unremarkable. Continue radiation as scheduled.  Last scheduled treatment of radiation is 08/03/2023. Follow-up with labs in 1 week.  Continue with PICC line flush and dressing changes weekly. The patient understands the plans discussed today and is in agreement with them.  He knows to contact our office if he develops concerns prior to his next appointment.  I provided 30 minutes of face-to-face time during this encounter and > 50% was spent counseling as documented under my assessment and plan.    Melvin FORBES Lessen, NP  [facility] - A DEPT OF [facility] [address] [place] [address] Dept: [phone] Dept Fax: [phone]   No orders of the defined types were placed in this encounter.     CHIEF COMPLAINT:  CC: Malignant neoplasm of upper third of esophagus  Current Treatment: Concurrent chemoradiation with weekly carboplatin  and paclitaxel   INTERVAL HISTORY:  [facility] is here today for repeat clinical assessment.  He was last seen on 07/22/2023 by Dr. Lanny.  Today is last cycle of chemotherapy.  Last scheduled  radiation date is 08/03/2023.  States that he has improved dysphagia.  He is able to manage his oral secretions better.  Able to tolerate some liquids by mouth.  Not eating anything solid.  Using his feeding tube is main source of nutrition.  He  has improved rash.  Continues to use nystatin  solution.  Managing constipation with MiraLAX  and Senokot. He denies chest pain, chest pressure, or shortness of breath. He denies headaches or visual disturbances. He denies abdominal pain, nausea, vomiting, or changes in bowel or bladder habits.   He denies fevers or chills. He denies pain. His appetite is good. His weight has been stable.  I have reviewed the past medical history, past surgical history, social history and family history with the patient and they are unchanged from previous note.  ALLERGIES:  is allergic to azilsartan and advil [ibuprofen].  MEDICATIONS:  Current Outpatient Medications  Medication Sig Dispense Refill   albuterol  (VENTOLIN  HFA) 108 (90 Base) MCG/ACT inhaler Inhale 2 puffs into the lungs every 4 (four) hours as needed for wheezing (Cough).     allopurinol  (ZYLOPRIM ) 300 MG tablet Take 300 mg by mouth every other day.     apixaban  (ELIQUIS ) 5 MG TABS tablet Take 1 tablet (5 mg total) by mouth 2 (two) times daily. 60 tablet 1   APIXABAN  (ELIQUIS ) VTE STARTER PACK (10MG  AND 5MG ) Take as directed on package: start with two-5mg  tablets twice daily for 7 days. On day 8, switch to one-5mg  tablet twice daily. 74 each 0   bismuth subsalicylate (PEPTO BISMOL) 262 MG/15ML suspension Take 30 mLs by mouth every 6 (six) hours as needed for indigestion or diarrhea or loose stools.     Blood Glucose Monitoring Suppl (ONETOUCH VERIO FLEX SYSTEM) w/Device KIT USE TO CHECK BLOOD SUGAR TWICE DAILY     calcium  carbonate (TUMS - DOSED IN MG ELEMENTAL CALCIUM ) 500 MG chewable tablet Chew 2 tablets (400 mg of elemental calcium  total) by mouth 3 (three) times daily with meals. (Patient taking differently: Chew 400 mg of elemental calcium  by mouth daily as needed.) 180 tablet 1   carvedilol  (COREG ) 3.125 MG tablet Take 3.125 mg by mouth 2 (two) times daily with a meal.     clonazePAM  (KLONOPIN ) 1 MG disintegrating tablet Take 1 mg by mouth daily.      diphenhydrAMINE  (BENADRYL ) 25 MG tablet Take 1 tablet (25 mg total) by mouth every 6 (six) hours. (Patient taking differently: Take 25 mg by mouth every 6 (six) hours as needed for allergies or sleep.) 20 tablet 0   famotidine  (PEPCID ) 40 MG/5ML suspension Place 2.5 mLs (20 mg total) into feeding tube daily. 50 mL 1   gabapentin  (NEURONTIN ) 300 MG capsule Take 300 mg by mouth 3 (three) times daily.   2   hydrocortisone  (ANUSOL -HC) 2.5 % rectal cream Place 1 Application rectally daily as needed for hemorrhoids or anal itching.     isosorbide -hydrALAZINE  (BIDIL ) 20-37.5 MG tablet Take 1 tablet by mouth in the morning and at bedtime. 90 tablet 3   lactose free nutrition (BOOST) LIQD Take 237 mLs by mouth daily.     lidocaine  (XYLOCAINE ) 2 % solution Patient: Mix 1part 2% viscous lidocaine , 1part H20. Swallow 10mL of diluted mixture, before meals and at bedtime, up to QID 200 mL 3   LORazepam  (ATIVAN ) 0.5 MG tablet Take 1 tablet PRN claustrophobia 20 minutes prior to radiation. Will need a driver. Use only as needed. 5 tablet 0   nystatin  (MYCOSTATIN ) 100000  UNIT/ML suspension Take 10 mLs (1,000,000 Units total) by mouth 4 (four) times daily. For oral thrush. Stop when thrush resolves 473 mL 0   omeprazole  (PRILOSEC) 20 MG capsule Take 20 mg by mouth daily.   4   ondansetron  (ZOFRAN ) 8 MG tablet Take 1 tablet (8 mg total) by mouth every 8 (eight) hours as needed for nausea or vomiting. Start on the third day after chemotherapy. 30 tablet 1   ONETOUCH VERIO test strip USE TO CHECK BLOOD SUGAR TWICE DAILY     polyethylene glycol (MIRALAX  / GLYCOLAX ) 17 g packet Take 17 g by mouth daily. (Patient taking differently: Take 17 g by mouth daily as needed.) 14 each 0   prochlorperazine  (COMPAZINE ) 10 MG tablet Take 1 tablet (10 mg total) by mouth every 6 (six) hours as needed for nausea or vomiting. 30 tablet 1   Protein (FEEDING SUPPLEMENT, PROSOURCE TF20,) liquid Place 60 mLs into feeding tube 2 (two)  times daily.     psyllium (HYDROCIL/METAMUCIL) 95 % PACK Take 1 packet by mouth daily. (Patient taking differently: Take 1 packet by mouth daily as needed for mild constipation or moderate constipation.) 240 each 0   senna-docusate (SENOKOT-S) 8.6-50 MG tablet Take 1 tablet by mouth 2 (two) times daily. (Patient taking differently: Take 1 tablet by mouth daily as needed for mild constipation or moderate constipation.) 60 tablet 1   Water  For Irrigation, Sterile (FREE WATER ) SOLN Place 100 mLs into feeding tube every 6 (six) hours.     atorvastatin  (LIPITOR) 40 MG tablet Take 1 tablet (40 mg total) by mouth at bedtime. (Patient taking differently: Take 40 mg by mouth in the morning.) 90 tablet 1   HYDROcodone -acetaminophen  (HYCET) 7.5-325 mg/15 ml solution Take 15 mLs by mouth 4 (four) times daily as needed for moderate pain (pain score 4-6). 473 mL 0   No current facility-administered medications for this visit.    HISTORY OF PRESENT ILLNESS:   Oncology History  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HCC)  06/14/2013 Procedure   Biopsy of the tonsil confirmrf squamous cell carcinoma. 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)  05/27/2023 Cancer Staging   Staging form: Esophagus - Squamous Cell Carcinoma, AJCC 8th Edition - Clinical stage from 05/27/2023: Stage Unknown (cTX, cN1, cM0, GX) - Signed by Melvin Callander, MD on 06/23/2023 Stage prefix: Initial diagnosis Histologic grading system: 3 grade system   06/08/2023 Initial Diagnosis   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)   06/24/2023 -  Chemotherapy   Patient is on Treatment Plan : ESOPHAGUS Carboplatin  + Paclitaxel  Weekly X 6 Weeks with XRT         REVIEW OF SYSTEMS:   Constitutional: Denies fevers, chills or abnormal weight loss Eyes:  Denies blurriness of vision Ears, nose, mouth, throat, and face: Denies mucositis or sore throat Respiratory: Denies cough, dyspnea or wheezes Cardiovascular: Denies palpitation, chest discomfort or lower extremity swelling Gastrointestinal:  Denies nausea, heartburn or change in bowel habits Skin: Denies abnormal skin rashes Lymphatics: Denies new lymphadenopathy or easy bruising Neurological:Denies numbness, tingling or new weaknesses Behavioral/Psych: Mood is stable, no new changes  All other systems were reviewed with the patient and are negative.   VITALS:   Today's Vitals   07/29/23 0921 07/29/23 9075  BP: 138/84   Pulse: 80   Resp: 17   Temp: 98.2 F (36.8 C)   SpO2: 98%   Weight: 241 lb 6.4 oz (109.5 kg)   PainSc:  4    Body mass index is 30.99 kg/m.   Wt Readings from Last 3 Encounters:  08/07/23 239 lb 4.8 oz (108.5 kg)  07/29/23 241 lb 6.4 oz (109.5 kg)  07/22/23 238 lb 12.8 oz (108.3 kg)    Body mass index is 30.99 kg/m.  Performance status (ECOG): 1 - Symptomatic but completely ambulatory  PHYSICAL EXAM:   GENERAL:alert, no distress and comfortable SKIN: skin color, texture, turgor are normal, no rashes or significant lesions EYES: normal, Conjunctiva are pink and non-injected, sclera clear OROPHARYNX:no exudate, no erythema and lips, buccal mucosa, and tongue normal  NECK: supple, thyroid  normal size, non-tender, without nodularity LYMPH:  no palpable lymphadenopathy in the cervical, axillary or inguinal LUNGS: clear to auscultation and percussion with normal breathing effort HEART: regular rate & rhythm and no murmurs and no lower extremity edema ABDOMEN:abdomen soft, non-tender and normal bowel sounds. Intact and patent  PEG tube.  Musculoskeletal:no cyanosis of digits and no clubbing  NEURO: alert & oriented x 3 with fluent speech, no focal motor/sensory deficits  LABORATORY DATA:  I have reviewed the data as listed    Component Value Date/Time    NA 139 08/07/2023 1159   NA 139 04/27/2023 0829   NA 139 08/15/2013 1428   K 3.8 08/07/2023 1159   K 4.0 08/15/2013 1428   CL 105 08/07/2023 1159   CO2 30 08/07/2023 1159   CO2 29 08/15/2013 1428   GLUCOSE 153 (H) 08/07/2023 1159   GLUCOSE 155 (H) 08/15/2013 1428   BUN 14 08/07/2023 1159   BUN 6 (L) 04/27/2023 0829   BUN 14.3 08/15/2013 1428   CREATININE 0.67 08/07/2023 1159   CREATININE 1.2 08/15/2013 1428   CALCIUM  8.8 (L) 08/07/2023 1159   CALCIUM  9.8 08/15/2013 1428   PROT 6.7 08/07/2023 1159   PROT 7.1 04/27/2023 0829   PROT 7.3 06/30/2013 1438   ALBUMIN 3.7 08/07/2023 1159   ALBUMIN 4.3 04/27/2023 0829   ALBUMIN 3.9 06/30/2013 1438   AST 16 08/07/2023 1159   AST 13 06/30/2013 1438   ALT 11 08/07/2023 1159   ALT 11 06/30/2013 1438   ALKPHOS 116 08/07/2023 1159   ALKPHOS 147 06/30/2013 1438   BILITOT 0.8 08/07/2023 1159   BILITOT 0.59 06/30/2013 1438   GFRNONAA >60 08/07/2023 1159   GFRAA 104 04/10/2020 0824     Lab Results  Component Value Date   WBC 2.6 (L) 08/07/2023   NEUTROABS 1.2 (L) 08/07/2023   HGB 10.4 (L) 08/07/2023   HCT 30.2 (L) 08/07/2023   MCV 89.3 08/07/2023   PLT 288 08/07/2023

## 2023-07-29 ENCOUNTER — Inpatient Hospital Stay

## 2023-07-29 ENCOUNTER — Other Ambulatory Visit: Payer: Self-pay

## 2023-07-29 ENCOUNTER — Ambulatory Visit
Admission: RE | Admit: 2023-07-29 | Discharge: 2023-07-29 | Disposition: A | Discharge status: Home / Self Care | Source: Ambulatory Visit | Attending: Radiation Oncology | Admitting: Radiation Oncology

## 2023-07-29 ENCOUNTER — Telehealth: Payer: Self-pay | Admitting: Dietician

## 2023-07-29 ENCOUNTER — Inpatient Hospital Stay (HOSPITAL_BASED_OUTPATIENT_CLINIC_OR_DEPARTMENT_OTHER): Admitting: Nurse Practitioner

## 2023-07-29 ENCOUNTER — Other Ambulatory Visit

## 2023-07-29 ENCOUNTER — Inpatient Hospital Stay: Admitting: Dietician

## 2023-07-29 VITALS — BP 138/84 | HR 80 | Temp 98.2°F | Resp 17 | Wt 241.4 lb

## 2023-07-29 DIAGNOSIS — Z452 Encounter for adjustment and management of vascular access device: Secondary | ICD-10-CM

## 2023-07-29 DIAGNOSIS — C153 Malignant neoplasm of upper third of esophagus: Secondary | ICD-10-CM | POA: Diagnosis not present

## 2023-07-29 DIAGNOSIS — Z510 Encounter for antineoplastic radiation therapy: Secondary | ICD-10-CM | POA: Diagnosis not present

## 2023-07-29 LAB — RAD ONC ARIA SESSION SUMMARY
Course Elapsed Days: 35
Plan Fractions Treated to Date: 25
Plan Prescribed Dose Per Fraction: 1.8 Gy
Plan Total Fractions Prescribed: 25
Plan Total Prescribed Dose: 45 Gy
Reference Point Dosage Given to Date: 45 Gy
Reference Point Session Dosage Given: 1.8 Gy
Session Number: 25

## 2023-07-29 LAB — CMP (CANCER CENTER ONLY)
ALT: 13 U/L (ref 0–44)
AST: 15 U/L (ref 15–41)
Albumin: 3.7 g/dL (ref 3.5–5.0)
Alkaline Phosphatase: 113 U/L (ref 38–126)
Anion gap: 4 — ABNORMAL LOW (ref 5–15)
BUN: 14 mg/dL (ref 8–23)
CO2: 31 mmol/L (ref 22–32)
Calcium: 9 mg/dL (ref 8.9–10.3)
Chloride: 102 mmol/L (ref 98–111)
Creatinine: 0.69 mg/dL (ref 0.61–1.24)
GFR, Estimated: >60 mL/min (ref >60)
Glucose, Bld: 143 mg/dL — ABNORMAL HIGH (ref 70–99)
Potassium: 4.1 mmol/L (ref 3.5–5.1)
Sodium: 137 mmol/L (ref 135–145)
Total Bilirubin: 1 mg/dL (ref 0.0–1.2)
Total Protein: 6.6 g/dL (ref 6.5–8.1)

## 2023-07-29 LAB — CBC WITH DIFFERENTIAL (CANCER CENTER ONLY)
Abs Immature Granulocytes: 0.01 10*3/uL (ref 0.00–0.07)
Basophils Absolute: 0 10*3/uL (ref 0.0–0.1)
Basophils Relative: 1 %
Eosinophils Absolute: 0.1 10*3/uL (ref 0.0–0.5)
Eosinophils Relative: 5 %
HCT: 26.3 % — ABNORMAL LOW (ref 39.0–52.0)
Hemoglobin: 9.4 g/dL — ABNORMAL LOW (ref 13.0–17.0)
Immature Granulocytes: 1 %
Lymphocytes Relative: 25 %
Lymphs Abs: 0.4 10*3/uL — ABNORMAL LOW (ref 0.7–4.0)
MCH: 30.4 pg (ref 26.0–34.0)
MCHC: 35.7 g/dL (ref 30.0–36.0)
MCV: 85.1 fL (ref 80.0–100.0)
Monocytes Absolute: 0.2 10*3/uL (ref 0.1–1.0)
Monocytes Relative: 10 %
Neutro Abs: 0.9 10*3/uL — ABNORMAL LOW (ref 1.7–7.7)
Neutrophils Relative %: 58 %
Platelet Count: 159 10*3/uL (ref 150–400)
RBC: 3.09 MIL/uL — ABNORMAL LOW (ref 4.22–5.81)
RDW: 20.9 % — ABNORMAL HIGH (ref 11.5–15.5)
WBC Count: 1.5 10*3/uL — ABNORMAL LOW (ref 4.0–10.5)
nRBC: 5.3 % — ABNORMAL HIGH (ref 0.0–0.2)

## 2023-07-29 MED ORDER — SODIUM CHLORIDE 0.9% FLUSH
10.0000 mL | Freq: Once | INTRAVENOUS | Status: AC
  Administered 2023-07-29: 10 mL

## 2023-07-29 NOTE — Telephone Encounter (Signed)
 Planned to see patient during infusion for nutrition follow-up. Final chemotherapy cancelled today due to labs. RD attempted to call patient at scheduled appointment time. Patient did not answer. Left VM with request for return call. Contact information provided.

## 2023-07-29 NOTE — Telephone Encounter (Signed)
 Nutrition Follow-up:  Patient with esophageal cancer. Patient receiving concurrent chemoradiation therapy with weekly carboplatin  + paclitaxel  (first chemo 5/7). Final RT planned 6/16   S/p PEG 4/24 during hospital admission DME: [employer]  Final chemo held today due to labs.  Received return call from patient. He reports increased pain in throat. Says it was so sore yesterday that he did not get in his last TF. Patient tolerating 5 cartons Nutren 1.5. He is asking if he could add a Boost to this. Patient denies nausea, vomiting, diarrhea.    Medications: reviewed   Labs: Hgb 9.4, glucose 143  Anthropometrics: Wt 241 lb 6.4 oz today - increased   6/4 - 238 lb 12.8 oz 5/28 - 240 lb 6.4 oz  5/21 - 238 lb 8 oz 5/14 - 239 lb 14.4 oz 5/7 - 246 lb 12 oz 5/2 - 250 lb 6.4 oz 4/27 - 252 lb 6.8 oz   Estimated Energy Needs  Kcals: 2200-2400 Protein: 100-120 Fluid: >2.2 L  NUTRITION DIAGNOSIS: Food and nutrition related knowledge deficit - improving    INTERVENTION:  Continue 5 cartons Nutren 1.5 Pt may add additional Boost via tube, however discussed this is not necessary Continue baking soda salt water  rinses     MONITORING, EVALUATION, GOAL: wt trends, TF   NEXT VISIT: To be scheduled with 2 week f/u appointments

## 2023-07-30 ENCOUNTER — Ambulatory Visit

## 2023-07-30 ENCOUNTER — Other Ambulatory Visit: Payer: Self-pay

## 2023-07-30 ENCOUNTER — Ambulatory Visit
Admission: RE | Admit: 2023-07-30 | Discharge: 2023-07-30 | Disposition: A | Discharge status: Home / Self Care | Source: Ambulatory Visit | Attending: Radiation Oncology | Admitting: Radiation Oncology

## 2023-07-30 DIAGNOSIS — Z510 Encounter for antineoplastic radiation therapy: Secondary | ICD-10-CM | POA: Diagnosis not present

## 2023-07-30 LAB — RAD ONC ARIA SESSION SUMMARY
Course Elapsed Days: 36
Plan Fractions Treated to Date: 1
Plan Prescribed Dose Per Fraction: 1.8 Gy
Plan Total Fractions Prescribed: 3
Plan Total Prescribed Dose: 5.4 Gy
Reference Point Dosage Given to Date: 1.8 Gy
Reference Point Session Dosage Given: 1.8 Gy
Session Number: 26

## 2023-07-31 ENCOUNTER — Ambulatory Visit
Admission: RE | Admit: 2023-07-31 | Discharge: 2023-07-31 | Disposition: A | Discharge status: Home / Self Care | Source: Ambulatory Visit | Attending: Radiation Oncology | Admitting: Radiation Oncology

## 2023-07-31 ENCOUNTER — Ambulatory Visit

## 2023-07-31 ENCOUNTER — Other Ambulatory Visit: Payer: Self-pay

## 2023-07-31 DIAGNOSIS — Z510 Encounter for antineoplastic radiation therapy: Secondary | ICD-10-CM | POA: Diagnosis not present

## 2023-07-31 LAB — RAD ONC ARIA SESSION SUMMARY
Course Elapsed Days: 37
Plan Fractions Treated to Date: 2
Plan Prescribed Dose Per Fraction: 1.8 Gy
Plan Total Fractions Prescribed: 3
Plan Total Prescribed Dose: 5.4 Gy
Reference Point Dosage Given to Date: 3.6 Gy
Reference Point Session Dosage Given: 1.8 Gy
Session Number: 27

## 2023-08-01 ENCOUNTER — Other Ambulatory Visit: Payer: Self-pay

## 2023-08-03 ENCOUNTER — Ambulatory Visit: Attending: Radiation Oncology

## 2023-08-03 ENCOUNTER — Other Ambulatory Visit: Payer: Self-pay

## 2023-08-03 ENCOUNTER — Ambulatory Visit

## 2023-08-03 ENCOUNTER — Ambulatory Visit
Admission: RE | Admit: 2023-08-03 | Discharge: 2023-08-03 | Disposition: A | Discharge status: Home / Self Care | Source: Ambulatory Visit | Attending: Radiation Oncology | Admitting: Radiation Oncology

## 2023-08-03 ENCOUNTER — Ambulatory Visit
Admission: RE | Admit: 2023-08-03 | Discharge: 2023-08-03 | Disposition: A | Discharge status: Home / Self Care | Source: Ambulatory Visit | Attending: Radiation Oncology

## 2023-08-03 DIAGNOSIS — R1312 Dysphagia, oropharyngeal phase: Secondary | ICD-10-CM | POA: Diagnosis present

## 2023-08-03 DIAGNOSIS — C099 Malignant neoplasm of tonsil, unspecified: Secondary | ICD-10-CM | POA: Diagnosis not present

## 2023-08-03 DIAGNOSIS — Z510 Encounter for antineoplastic radiation therapy: Secondary | ICD-10-CM | POA: Diagnosis not present

## 2023-08-03 DIAGNOSIS — C153 Malignant neoplasm of upper third of esophagus: Secondary | ICD-10-CM | POA: Diagnosis not present

## 2023-08-03 LAB — RAD ONC ARIA SESSION SUMMARY
Course Elapsed Days: 40
Plan Fractions Treated to Date: 3
Plan Prescribed Dose Per Fraction: 1.8 Gy
Plan Total Fractions Prescribed: 3
Plan Total Prescribed Dose: 5.4 Gy
Reference Point Dosage Given to Date: 5.4 Gy
Reference Point Session Dosage Given: 1.8 Gy
Session Number: 28

## 2023-08-03 NOTE — Therapy (Signed)
 OUTPATIENT SPEECH LANGUAGE PATHOLOGY SWALLOW EVALUATION   Patient Name: Melvin Hudson MRN: [id number] DOB:November 03, 1955, 68 y.o., male Today's Date: 08/03/2023  PCP: Lorella Roles, MD REFERRING PROVIDER: Colie Dawes, MD  END OF SESSION:  End of Session - 08/03/23 2213     Visit Number 1    Number of Visits 7    Date for SLP Re-Evaluation 11/01/23    Authorization - Number of Visits 27    SLP Start Time 1320    SLP Stop Time  1402    SLP Time Calculation (min) 42 min    Activity Tolerance Patient tolerated treatment well          Past Medical History:  Diagnosis Date   Angioedema 09/17/11   tongue and lips   Arthritis    both of my legs and feet (01/05/2014)   CHF (congestive heart failure) (HCC)    Diabetes mellitus without complication (HCC)    Erectile dysfunction 04/20/2018   GI bleed    Heart murmur 04/20/2018   Hx of gout    Hypertension    Obesity    OSA on CPAP    Pure hypercholesterolemia 04/20/2018   S/P radiation therapy 07/26/2013-09/15/2013   Right tonsil/bilateral neck/ 7000 cGy   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   Past Surgical History:  Procedure Laterality Date   COLONOSCOPY Left 04/11/2014   Procedure: COLONOSCOPY;  Surgeon: Evangeline Hilts, MD;  Location: [facility] ENDOSCOPY;  Service: Endoscopy;  Laterality: Left;   COLONOSCOPY N/A 04/20/2014   Procedure: COLONOSCOPY;  Surgeon: Yvetta Herbert, MD;  Location: [facility] ENDOSCOPY;  Service: Endoscopy;  Laterality: N/A;   COLONOSCOPY W/ BIOPSIES AND POLYPECTOMY     benign   DIRECT LARYNGOSCOPY  01/05/2014   DIRECT LARYNGOSCOPY N/A 01/05/2014   Procedure: DIRECT LARYNGOSCOPY AND BIOPSY;  Surgeon: Janita Mellow, MD;  Location: [facility] OR;  Service: ENT;  Laterality: N/A;   ESOPHAGOSCOPY WITH DILITATION N/A 05/27/2023   Procedure: ESOPHAGOSCOPY;  Surgeon: Janita Mellow, MD;  Location: [facility] OR;  Service: ENT;  Laterality: N/A;   IR GASTROSTOMY TUBE MOD SED  06/12/2023   KNEE ARTHROSCOPY Right ~ 1994    LEFT AND RIGHT HEART CATHETERIZATION WITH CORONARY/GRAFT ANGIOGRAM N/A 12/31/2010   Procedure: LEFT AND RIGHT HEART CATHETERIZATION WITH Estella Helling;  Surgeon: Jessica Morn, MD;  Location: [facility] CATH LAB;  Service: Cardiovascular;  Laterality: N/A;   MULTIPLE EXTRACTIONS WITH ALVEOLOPLASTY N/A 07/08/2013   Procedure: Extraction of tooth #'s 22, 27 with alveoloplasty and bilateal mandibular facial exostoses reductions;  Surgeon: Carol Chroman, DDS;  Location: [facility] ORS;  Service: Oral Surgery;  Laterality: N/A;   MULTIPLE TOOTH EXTRACTIONS  ~ 2008   Patient Active Problem List   Diagnosis Date Noted   Malnutrition of moderate degree 06/12/2023   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   Dysphagia 03/02/2023   Hypocalcemia 04/17/2022   Anxiety state 04/15/2022   AKI (acute kidney injury) (HCC) 04/15/2022   Chronic diastolic heart failure (HCC) 04/15/2022   Syncope 04/15/2022   Nonspecific abnormal electrocardiogram (ECG) (EKG) 04/15/2022   GI bleed 04/13/2022   PICC (peripherally inserted central catheter) flush 01/01/2022   Status post radiation therapy 12/05/2020   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   Edentulous 12/05/2020   Ill-fitting dentures 12/05/2020   Former smoker 11/11/2019   Healthcare maintenance 11/11/2019   Bilateral leg edema 04/21/2018   Heart murmur 04/20/2018   Erectile dysfunction 04/20/2018   Pure hypercholesterolemia  04/20/2018   Obesity, Class III, BMI 40-49.9 (morbid obesity)    BRBPR (bright red blood per rectum) 08/30/2016   Chronic pain syndrome    History of GI diverticular bleed 04/18/2014   Hematochezia 04/10/2014   Symptomatic anemia 04/08/2014   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   Mouth pain 07/18/2013   Weight loss 07/18/2013   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HCC) 06/29/2013   OSA (obstructive sleep apnea) 11/19/2011   Angio-edema 09/17/2011   HTN (hypertension)  09/17/2011   Gout 09/17/2011    ONSET DATE: script dated 06/08/23  REFERRING DIAG: C09.9 (ICD-10-CM) - Malignant neoplasm of tonsil (HCC) C15.3 (ICD-10-CM) - Malignant neoplasm of upper third esophagus (HCC)  THERAPY DIAG:  Oropharyngeal dysphagia  Rationale for Evaluation and Treatment: Rehabilitation  SUBJECTIVE:   SUBJECTIVE STATEMENT: Pt reports that prior to radiation coughing occurred with each and all PO boluses. Pt reports drinking approx 3-4 oz of apple juice last night coughing once.  Pt accompanied by: family member - Sister Aurora Blowers  PERTINENT HISTORY: See above. Tonsillar cancer 2015 Charlann Confer thought rt-sided) treated with rad tx by Dr. Lurena Sally.   PAIN:  Are you having pain? No  FALLS: Has patient fallen in last 6 months?  No  LIVING ENVIRONMENT: Lives with: lives with their family Lives in: House/apartment  PLOF:  Level of assistance: Independent with ADLs, Independent with IADLs Employment: Retired  PATIENT GOALS: Find out what's going on  OBJECTIVE:  Note: Objective measures were completed at Evaluation unless otherwise noted. OBJECTIVE:   DIAGNOSTIC FINDINGS:  DG ESOPHAGUS Jan 2025: IMPRESSION: Moderate persistent narrowing of esophagus at cervicothoracic junction. It is uncertain if this is due to external compression or stricture or mass. Endoscopy is recommended as well as potentially CT scan of the chest with intravenous contrast.   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  INSTRUMENTAL SWALLOW STUDY FINDINGS (MBSS) None noted  COGNITION: Overall cognitive status: Within functional limits for tasks assessed  SUBJECTIVE DYSPHAGIA REPORTS:  Date of onset: January 2025 Reported symptoms: coughing with both solids and liquids  Current diet: thin liquids, currently. Drs have not cleared pt for POs at this time.  Co-morbid voice changes: No  FACTORS WHICH MAY INCREASE RISK OF ADVERSE EVENT IN PRESENCE OF  ASPIRATION:  General health: frail or deconditioned  Risk factors: tube present (PEG) - just completed esophageal radiation    ORAL MOTOR EXAMINATION: Overall status: Impaired: Lingual: Right (Strength) Comments: slight rt weakness  CLINICAL SWALLOW ASSESSMENT:   Dentition: edentulous pt owns dentures however, states,I'm not chewing anything right now, and I'm not going out Vocal quality at baseline: normal Patient directly observed with POs: Yes: thin liquids  Feeding: able to feed self Liquids provided by: teaspoon and cup Oral phase signs and symptoms: none noted Pharyngeal phase signs and symptoms: seemingly reduced thyroid  elevation, audible swallow, wet vocal quality, and delayed cough. After a larger cup sip and a cued smaller (half the size) sip, pt's voice was consistently wet and very noisy. He coughed after the smaller cup sip 5 seconds after the 2nd (smaller) sip When told to take a teaspoon sip pt's swallow was quiet and voice was clear. He was told to continue drinking, only using teaspoon sips at this time.   PATIENT REPORTED OUTCOME MEASURES (PROM): EAT-10: to be administered first session  TREATMENT DATE:   08/02/24: SLP educated pt on swallow exercises today including effortful swallow, and Mendelsohn maneuver. He req'd mod A faded to min A with both exercises. He was told to complete 20 swallows each day for each of these. SLP also provided information about aspiration PNA. See pt instructions.  PATIENT EDUCATION: Education details: see treatment date for today's date Person educated: Patient and sister Education method: Explanation, Demonstration, Verbal cues, and Handouts Education comprehension: verbalized understanding, returned demonstration, verbal cues required, and needs further education   ASSESSMENT:  CLINICAL  IMPRESSION: Patient is a 68 y.o. M who was seen today for a clinical swallow evaluation. At this time pt tells SLP his doctors have not cleared him for solid food so SLP worked with liquid, which pt currently consumes regularly. See clinical swallow assessment above. SLP told pt he was safe to cont with liquids as often as he desired, as long AS BOLUSES ARE TEASPOON-SIZE. When pt is cleared for solids he would benefit from a modified barium swallow exam to assess what solids he is safest to consume, and to indicate which swallowing musculature could be weaker at this time. Pt reported a year ago he did not have any s/sx of coughing with POs. Today's difficulty could be due to deconditioning from esophageal rad that was compensated for when pt was at a more peak health level prior to current difficulty with swallowing and esophageal cancer.  OBJECTIVE IMPAIRMENTS: include dysphagia. These impairments are limiting patient from safety when swallowing. Factors affecting potential to achieve goals and functional outcome are co-morbidities, previous level of function, and severity of impairments. Patient will benefit from skilled SLP services to address above impairments and improve overall function.  REHAB POTENTIAL: Good   GOALS: Goals reviewed with patient? No  SHORT TERM GOALS: Target date: 09/04/23  Pt complete HEP with mod I  Baseline: Goal status: INITIAL  2.  Pt demo knowledge of sx of pharyngeal dysphagia Baseline:  Goal status: INITIAL  3.  Pt demo knowledge of overt s/sx aspiration over two sessions Baseline:  Goal status: INITIAL  4.  Pt will demo knowledge of how a food journal can assist return to pre-rad tx diet Baseline:  Goal status: INITIAL   LONG TERM GOALS: Target date: 11/01/23  Pt will undergo MBS when clinically appropriate Baseline:  Goal status: INITIAL  2.  Pt will complete HEP with mod I over two sessions Baseline:  Goal status: INITIAL  3.  Pt demo  knowledge of overt s/sx aspiration PNA over two sessions Baseline:  Goal status: INITIAL  4.  Pt will improve score on PROM Baseline:  Goal status: INITIAL   PLAN:  SLP FREQUENCY: every other week  SLP DURATION: 6 sessions  PLANNED INTERVENTIONS: Aspiration precaution training, Pharyngeal strengthening exercises, Diet toleration management , Environmental controls, Trials of upgraded texture/liquids, Cueing hierachy, Internal/external aids, Oral motor exercises, SLP instruction and feedback, Compensatory strategies, Patient/family education, and [id number] Treatment of swallowing function    Brace Welte, CCC-SLP 08/03/2023, 10:55 PM

## 2023-08-03 NOTE — Progress Notes (Signed)
 Oncology Nurse Navigator Documentation   Met with Melvin Hudson after final RT to offer support and to celebrate end of radiation treatment.   He tolerated the radiation well to his esophagus with his history of head and neck radiation. I reminded him of his appointment with Melvin Hudson SLP today and 1:15. He is aware and plans to attend as scheduled. He knows that I'm available to call if he has any questions or concerns in the future.    Lynetta Saran RN, BSN, OCN Head & Neck Oncology Nurse Navigator [facility] Phone # [phone]  Fax # [phone]

## 2023-08-03 NOTE — Patient Instructions (Signed)
     SWALLOWING EXERCISES Do these 5 days a week for 8 weeks, then 2-3 times per week afterwards You can use a wet spoon to swallow something, if your mouth gets dry  Effortful Swallows - Swallow your saliva (or a teaspoon of water ) as hard as you can  - Do 20 reps/day  Mendelsohn Maneuver - squeeze swallow exercise - Swallow hard and hold the swallow for 5 seconds - Do 20 reps/day - DO NOT USE A TEASPOON OF WATER  with this, but a couple of drops is ok   ===================================================  When you swallow liquids use a teaspoon instead of taking a sip from the cup  ===================================================  Signs of Aspiration Pneumonia   Chest pain/tightness Fever (can be low grade) Cough  With foul-smelling phlegm (sputum) With sputum containing pus or blood With greenish sputum Fatigue  Shortness of breath  Wheezing   **IF YOU HAVE THESE SIGNS, CONTACT YOUR DOCTOR OR GO TO THE EMERGENCY DEPARTMENT OR URGENT CARE AS SOON AS POSSIBLE**

## 2023-08-04 ENCOUNTER — Other Ambulatory Visit: Payer: Self-pay

## 2023-08-04 ENCOUNTER — Ambulatory Visit

## 2023-08-04 NOTE — Radiation Completion Notes (Signed)
 Patient Name: Melvin Hudson, Melvin Hudson MRN: [id number] Date of Birth: 1955-11-18 Referring Physician: Janita Mellow, M.D. Date of Service: 2023-08-04 Radiation Oncologist: Colie Dawes, M.D.  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) T=cTX, N=cN1, M=cM0 Intent: Curative     ==========DELIVERED PLANS==========  First Treatment Date: 2023-06-24 Last Treatment Date: 2023-08-03   Plan Name: Esoph_Up_Init Site: Esophagus Technique: IMRT Mode: Photon Dose Per Fraction: 1.8 Gy Prescribed Dose (Delivered / Prescribed): 45 Gy / 45 Gy Prescribed Fxs (Delivered / Prescribed): 25 / 25   Plan Name: Esoph_Bst_Up Site: Esophagus Technique: IMRT Mode: Photon Dose Per Fraction: 1.8 Gy Prescribed Dose (Delivered / Prescribed): 5.4 Gy / 5.4 Gy Prescribed Fxs (Delivered / Prescribed): 3 / 3     ==========ON TREATMENT VISIT DATES========== 2023-06-26, 2023-06-29, 2023-07-06, 2023-07-08, 2023-07-20, 2023-07-27, 2023-08-03     ==========UPCOMING VISITS==========       ==========APPENDIX - ON TREATMENT VISIT NOTES==========   See weekly On Treatment Notes in Epic for details in the Media tab (listed as Progress notes on the On Treatment Visit Dates listed above).

## 2023-08-05 ENCOUNTER — Other Ambulatory Visit: Payer: Self-pay | Admitting: Nurse Practitioner

## 2023-08-05 ENCOUNTER — Other Ambulatory Visit (HOSPITAL_COMMUNITY): Payer: Self-pay

## 2023-08-05 MED ORDER — HYDROCODONE-ACETAMINOPHEN 7.5-325 MG/15ML PO SOLN
15.0000 mL | Freq: Four times a day (QID) | ORAL | 0 refills | Status: DC | PRN
  Filled 2023-08-05: qty 473, 8d supply, fill #0

## 2023-08-06 ENCOUNTER — Other Ambulatory Visit: Payer: Self-pay | Admitting: Nurse Practitioner

## 2023-08-06 ENCOUNTER — Encounter: Payer: Self-pay | Admitting: Hematology

## 2023-08-06 DIAGNOSIS — C153 Malignant neoplasm of upper third of esophagus: Secondary | ICD-10-CM

## 2023-08-06 NOTE — Addendum Note (Signed)
 Encounter addended by: Karolynn Pack on: 08/06/2023 12:34 PM  Actions taken: Imaging Exam ended

## 2023-08-06 NOTE — Assessment & Plan Note (Addendum)
 cTxN1M0, invasive squamous cell carcinoma -Present with progressive dysphagia since December 2024. -EGD on 05/27/2023 which showed a mass in the proximal esophagus, 2 to 3 cm below introitus.  Biopsy confirmed invasive squamous cell carcinoma.  -PET scan showed hypermetabolic lesion in the proximal esophagus, with a positive peritracheal lymph node.  No distant metastasis -Status post PEG feeding tube placement  -He started chemoRT on 06/24/2023 -ED visit for chest pain on 06/29/2023 - 07/28/2023 - Final dose chemotherapy was held.  - 08/03/2023 -completed radiation. - 08/07/2023 -overall, patient doing well.  Will continue PICC line dressing changes and flushes for now.  Follow-up with labs in 2 weeks.

## 2023-08-06 NOTE — Progress Notes (Signed)
 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 -EGD on 05/27/2023 which showed a mass in the proximal esophagus, 2 to 3 cm below introitus.  Biopsy confirmed invasive squamous cell carcinoma.  -PET scan showed hypermetabolic lesion in the proximal esophagus, with a positive peritracheal lymph node.  No distant metastasis -Status post PEG feeding tube placement  -He started chemoRT on 06/24/2023 -ED visit for chest pain on 06/29/2023 - 07/28/2023 - Final dose chemotherapy was held.  - 08/03/2023 -completed radiation. - 08/07/2023 -overall, patient doing well.  Will continue PICC line dressing changes and flushes for now.  Follow-up with labs in 2 weeks.    Plan: Labs reviewed. - Mild neutropenia with WBC 2.6 and ANC 1.2.  Mild anemia with Hgb 10.4 and HCT 30.2. - CMP is unremarkable. He completed radiation treatment on 08/03/2023.  Continues to have PICC line dressing changes weekly. Able to eat small amounts of thickened liquids which is improvement. Continue tube feeds for nutrition and correct food. Repeat labs and follow-up in 2 weeks.  Discussed removal of PICC line at that time.  The patient understands the plans discussed today and is in agreement with them.  He knows to contact our office if he develops concerns prior to his next appointment.  I provided 25 minutes of face-to-face time during  this encounter and > 50% was spent counseling as documented under my assessment and plan.    [name], NP  [facility] - A DEPT OF [facility] [address] [place] [address] Dept: [phone] Dept Fax: [phone]   No orders of the defined types were placed in this encounter.     CHIEF COMPLAINT:  CC: Esophageal cancer  Current Treatment: Concurrent chemoradiation with weekly carboplatin  and paclitaxel   INTERVAL HISTORY:  Melvin Hudson is here today for repeat clinical assessment.  He was last seen by me on 07/29/2023.  His last scheduled radiation date was 08/03/2023.  Last cycle of chemotherapy was held due to neutropenia.  Final dose radiation given 08/03/2023.  Patient reports being able to eat a small amount of thickened liquids.  This is improvement.  Using tube feeds just supplement nutrition and caloric pain.  Reports doing well in general.  He denies chest pain, chest pressure, or shortness of breath. He denies headaches or visual disturbances. He denies abdominal pain, nausea, vomiting, or changes in bowel or bladder habits.  He denies fevers or chills. He denies pain. His appetite is good. His weight has decreased 2 pounds over last week but.  I have reviewed the past medical history, past surgical history, social history and family history with the patient and they are unchanged from previous note.  ALLERGIES:  is allergic to azilsartan and advil [ibuprofen].  MEDICATIONS:  Current Outpatient Medications  Medication Sig Dispense Refill   albuterol  (VENTOLIN  HFA) 108 (90 Base) MCG/ACT inhaler Inhale 2 puffs into the lungs every 4 (four) hours as needed  for wheezing (Cough).     allopurinol  (ZYLOPRIM ) 300 MG tablet Take 300 mg by mouth every other day.     apixaban  (ELIQUIS ) 5 MG TABS tablet Take 1 tablet (5 mg total) by mouth 2 (two) times daily. 60 tablet 1   APIXABAN  (ELIQUIS ) VTE STARTER PACK (10MG   AND 5MG ) Take as directed on package: start with two-5mg  tablets twice daily for 7 days. On day 8, switch to one-5mg  tablet twice daily. 74 each 0   atorvastatin  (LIPITOR) 40 MG tablet Take 1 tablet (40 mg total) by mouth at bedtime. (Patient taking differently: Take 40 mg by mouth in the morning.) 90 tablet 1   bismuth subsalicylate (PEPTO BISMOL) 262 MG/15ML suspension Take 30 mLs by mouth every 6 (six) hours as needed for indigestion or diarrhea or loose stools.     Blood Glucose Monitoring Suppl (ONETOUCH VERIO FLEX SYSTEM) w/Device KIT USE TO CHECK BLOOD SUGAR TWICE DAILY     calcium  carbonate (TUMS - DOSED IN MG ELEMENTAL CALCIUM ) 500 MG chewable tablet Chew 2 tablets (400 mg of elemental calcium  total) by mouth 3 (three) times daily with meals. (Patient taking differently: Chew 400 mg of elemental calcium  by mouth daily as needed.) 180 tablet 1   carvedilol  (COREG ) 3.125 MG tablet Take 3.125 mg by mouth 2 (two) times daily with a meal.     clonazePAM  (KLONOPIN ) 1 MG disintegrating tablet Take 1 mg by mouth daily.     diphenhydrAMINE  (BENADRYL ) 25 MG tablet Take 1 tablet (25 mg total) by mouth every 6 (six) hours. (Patient taking differently: Take 25 mg by mouth every 6 (six) hours as needed for allergies or sleep.) 20 tablet 0   famotidine  (PEPCID ) 40 MG/5ML suspension Place 2.5 mLs (20 mg total) into feeding tube daily. 50 mL 1   gabapentin  (NEURONTIN ) 300 MG capsule Take 300 mg by mouth 3 (three) times daily.   2   HYDROcodone -acetaminophen  (HYCET) 7.5-325 mg/15 ml solution Take 15 mLs by mouth 4 (four) times daily as needed for moderate pain (pain score 4-6). 473 mL 0   hydrocortisone  (ANUSOL -HC) 2.5 % rectal cream Place 1 Application rectally daily as needed for hemorrhoids or anal itching.     isosorbide -hydrALAZINE  (BIDIL ) 20-37.5 MG tablet Take 1 tablet by mouth in the morning and at bedtime. 90 tablet 3   lactose free nutrition (BOOST) LIQD Take 237 mLs by mouth daily.     lidocaine   (XYLOCAINE ) 2 % solution Patient: Mix 1part 2% viscous lidocaine , 1part H20. Swallow 10mL of diluted mixture, before meals and at bedtime, up to QID 200 mL 3   LORazepam  (ATIVAN ) 0.5 MG tablet Take 1 tablet PRN claustrophobia 20 minutes prior to radiation. Will need a driver. Use only as needed. 5 tablet 0   nystatin  (MYCOSTATIN ) 100000 UNIT/ML suspension SHAKE LIQUID AND TAKE 10 ML BY MOUTH FOUR TIMES DAILY FOR THRUSH. STOP WHEN THRUSH RESOLVES 473 mL 2   nystatin  (MYCOSTATIN ) 100000 UNIT/ML suspension Take 10 mLs (1,000,000 Units total) by mouth 4 (four) times daily. For oral thrush. Stop when thrush resolves 473 mL 0   omeprazole  (PRILOSEC) 20 MG capsule Take 20 mg by mouth daily.   4   ondansetron  (ZOFRAN ) 8 MG tablet Take 1 tablet (8 mg total) by mouth every 8 (eight) hours as needed for nausea or vomiting. Start on the third day after chemotherapy. 30 tablet 1   ONETOUCH VERIO test strip USE TO CHECK BLOOD SUGAR TWICE DAILY     polyethylene  glycol (MIRALAX  / GLYCOLAX ) 17 g packet Take 17 g by mouth daily. (Patient taking differently: Take 17 g by mouth daily as needed.) 14 each 0   prochlorperazine  (COMPAZINE ) 10 MG tablet Take 1 tablet (10 mg total) by mouth every 6 (six) hours as needed for nausea or vomiting. 30 tablet 1   Protein (FEEDING SUPPLEMENT, PROSOURCE TF20,) liquid Place 60 mLs into feeding tube 2 (two) times daily.     psyllium (HYDROCIL/METAMUCIL) 95 % PACK Take 1 packet by mouth daily. (Patient taking differently: Take 1 packet by mouth daily as needed for mild constipation or moderate constipation.) 240 each 0   senna-docusate (SENOKOT-S) 8.6-50 MG tablet Take 1 tablet by mouth 2 (two) times daily. (Patient taking differently: Take 1 tablet by mouth daily as needed for mild constipation or moderate constipation.) 60 tablet 1   Water  For Irrigation, Sterile (FREE WATER ) SOLN Place 100 mLs into feeding tube every 6 (six) hours.     No current facility-administered medications  for this visit.    HISTORY OF PRESENT ILLNESS:   Oncology History  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HCC)  06/14/2013 Procedure   Biopsy of the tonsil confirmrf squamous cell carcinoma. 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)  05/27/2023 Cancer Staging   Staging form: Esophagus - Squamous Cell Carcinoma, AJCC 8th Edition - Clinical stage from 05/27/2023: Stage Unknown (cTX, cN1, cM0, GX) - Signed by [name], MD on 06/23/2023 Stage prefix: Initial diagnosis Histologic grading system: 3 grade system   06/08/2023 Initial Diagnosis   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)   06/24/2023 -  Chemotherapy   Patient is on Treatment Plan : ESOPHAGUS Carboplatin  + Paclitaxel  Weekly X 6 Weeks with XRT         REVIEW OF SYSTEMS:   Constitutional: Denies fevers, chills or abnormal weight loss.  Eyes: Denies blurriness of vision Ears, nose, mouth, throat, and face: Denies mucositis or sore throat Respiratory: Denies cough, dyspnea or wheezes Cardiovascular: Denies palpitation, chest discomfort or lower extremity swelling Gastrointestinal:  Denies nausea, heartburn or change in bowel habits.  Has been able to tolerate small amounts of thickened liquids. Skin: Denies abnormal skin rashes Lymphatics: Denies new lymphadenopathy or easy bruising Neurological:Denies numbness, tingling or new weaknesses Behavioral/Psych: Mood is stable, no new changes  All other systems were reviewed with the patient and are negative.   VITALS:   Today's Vitals   08/07/23 1255  BP: 116/68  Pulse: 76  Resp: 15  Temp: 98.4 F (36.9 C)  TempSrc: Temporal  SpO2: 99%  Weight: 239 lb 4.8 oz (108.5 kg)  Height: 6' 2 (1.88 m)   Body mass index is 30.72 kg/m.   Wt Readings from Last 3 Encounters:   08/20/23 242 lb 12.8 oz (110.1 kg)  08/07/23 239 lb 4.8 oz (108.5 kg)  07/29/23 241 lb 6.4 oz (109.5 kg)    Body mass index is 30.72 kg/m.  Performance status (ECOG): 1 - Symptomatic but completely ambulatory  PHYSICAL EXAM:   GENERAL:alert, no distress and comfortable SKIN: skin color, texture, turgor are normal, no rashes or significant lesions EYES: normal, Conjunctiva are pink and non-injected, sclera clear OROPHARYNX:no exudate, no erythema and lips, buccal mucosa, and tongue normal  NECK: supple, thyroid  normal size,  non-tender, without nodularity LYMPH:  no palpable lymphadenopathy in the cervical, axillary or inguinal LUNGS: clear to auscultation and percussion with normal breathing effort HEART: regular rate & rhythm and no murmurs and no lower extremity edema ABDOMEN:abdomen soft, non-tender and normal bowel sounds. Intact g-tube, used for feeding.   Musculoskeletal:no cyanosis of digits and no clubbing  NEURO: alert & oriented x 3 with fluent speech, no focal motor/sensory deficits  LABORATORY DATA:  I have reviewed the data as listed    Component Value Date/Time   NA 140 08/20/2023 1218   NA 139 04/27/2023 0829   NA 139 08/15/2013 1428   K 4.0 08/20/2023 1218   K 4.0 08/15/2013 1428   CL 105 08/20/2023 1218   CO2 32 08/20/2023 1218   CO2 29 08/15/2013 1428   GLUCOSE 133 (H) 08/20/2023 1218   GLUCOSE 155 (H) 08/15/2013 1428   BUN 12 08/20/2023 1218   BUN 6 (L) 04/27/2023 0829   BUN 14.3 08/15/2013 1428   CREATININE 0.71 08/20/2023 1218   CREATININE 1.2 08/15/2013 1428   CALCIUM  9.1 08/20/2023 1218   CALCIUM  9.8 08/15/2013 1428   PROT 6.5 08/20/2023 1218   PROT 7.1 04/27/2023 0829   PROT 7.3 06/30/2013 1438   ALBUMIN 3.7 08/20/2023 1218   ALBUMIN 4.3 04/27/2023 0829   ALBUMIN 3.9 06/30/2013 1438   AST 17 08/20/2023 1218   AST 13 06/30/2013 1438   ALT 12 08/20/2023 1218   ALT 11 06/30/2013 1438   ALKPHOS 106 08/20/2023 1218   ALKPHOS 147 06/30/2013  1438   BILITOT 0.8 08/20/2023 1218   BILITOT 0.59 06/30/2013 1438   GFRNONAA >60 08/20/2023 1218   GFRAA 104 04/10/2020 0824     Lab Results  Component Value Date   WBC 4.2 08/20/2023   NEUTROABS 2.4 08/20/2023   HGB 11.3 (L) 08/20/2023   HCT 33.6 (L) 08/20/2023   MCV 91.3 08/20/2023   PLT 259 08/20/2023    RADIOGRAPHIC STUDIES: DG ABDOMEN PEG TUBE LOCATION Result Date: 08/14/2023 CLINICAL DATA:  14941 Gastrostomy status (HCC) 14941 EXAM: ABDOMEN - 1 VIEW COMPARISON:  August 14, 2023 FINDINGS: The patient received 50 ml of Omnipaque  300 contrast via the gastrostomy tube. After the hand injection of enteric contrast, there is opacification of the stomach rugae with normal flow of contrast into the duodenum. IMPRESSION: Percutaneous gastrostomy tube in place with the tip in the stomach. Electronically Signed   By: Rogelia Myers M.D.   On: 08/14/2023 14:21   DG Chest 2 View Result Date: 08/14/2023 CLINICAL DATA:  Esophageal mass. EXAM: CHEST - 2 VIEW COMPARISON:  Jun 29, 2023. FINDINGS: The heart size and mediastinal contours are within normal limits. Right-sided PICC line is noted with distal tip in expected position of the SVC. Both lungs are clear. The visualized skeletal structures are unremarkable. IMPRESSION: No active cardiopulmonary disease. Electronically Signed   By: Lynwood Landy Raddle M.D.   On: 08/14/2023 13:32   DG Abd 1 View Result Date: 08/14/2023 CLINICAL DATA:  Gastrostomy tube placement. EXAM: ABDOMEN - 1 VIEW COMPARISON:  None Available. FINDINGS: Distal tip of gastrostomy tube appears to be in expected position of duodenum. Normal contrast filling of proximal small bowel is noted. No leakage is noted. No abnormal bowel dilatation. IMPRESSION: Distal tip of gastrostomy tube appears to be in expected position of duodenum. No abnormal bowel dilatation. Electronically Signed   By: Lynwood Landy Raddle M.D.   On: 08/14/2023 13:31   IR Radiologist Eval & Mgmt  Result Date:  08/14/2023 Crawford Dorothyann POUR     08/14/2023  2:30 PM Allred, Darrell K, PA-C Physician Assistant Radiology  Progress Notes   Signed  Date of Service: 08/14/2023 11:00 AM  Signed   Patient ID: Melvin Hudson, male   DOB: Jun 10, 1955, 68 y.o.   MRN: [id number] Patient seen again in IR today for evaluation of PICC line and gastrostomy tube.  Chest x-ray today shows tip of PICC line in mid to distal SVC.  Appropriate to continue using per Dr. Johann.  Patient's G-tube has migrated inward on abdominal film.  Saline was removed from existing balloon, gastrostomy tube retracted and balloon refilled with 20 cc saline.  Balloon and outer disc were made taut to skin/abd wall. Tube flushed without difficulty.  Follow-up abdominal film showed appropriate placement of gastrostomy tube.  Oncology office notified of above.       IR Radiologist Eval & Mgmt Result Date: 08/10/2023 Crawford Dorothyann POUR     08/10/2023  3:53 PM Mr Maimone came to IR today with a leaking G tube.  The G-tube its self looked to be in good condition and flushed easily with 20 ml of normal saline.  The bumper was pushed off his skin 3 cm which created a gap causing the leaking from the G tube.  He stated he had lost 60 lbs. It is reasonable that this is the cause of the gap. The bumper was moved down to his skin removing the gap and leaking around the tube.  A clean dry dressing was applied to the G tube. He was discharge home.

## 2023-08-07 ENCOUNTER — Inpatient Hospital Stay

## 2023-08-07 ENCOUNTER — Other Ambulatory Visit (HOSPITAL_COMMUNITY): Payer: Self-pay

## 2023-08-07 ENCOUNTER — Inpatient Hospital Stay (HOSPITAL_BASED_OUTPATIENT_CLINIC_OR_DEPARTMENT_OTHER): Admitting: Nurse Practitioner

## 2023-08-07 ENCOUNTER — Encounter: Payer: Self-pay | Admitting: Hematology

## 2023-08-07 VITALS — BP 116/68 | HR 76 | Temp 98.4°F | Resp 15 | Ht 74.0 in | Wt 239.3 lb

## 2023-08-07 DIAGNOSIS — Z452 Encounter for adjustment and management of vascular access device: Secondary | ICD-10-CM

## 2023-08-07 DIAGNOSIS — C153 Malignant neoplasm of upper third of esophagus: Secondary | ICD-10-CM | POA: Diagnosis not present

## 2023-08-07 DIAGNOSIS — Z510 Encounter for antineoplastic radiation therapy: Secondary | ICD-10-CM | POA: Diagnosis not present

## 2023-08-07 LAB — CMP (CANCER CENTER ONLY)
ALT: 11 U/L (ref 0–44)
AST: 16 U/L (ref 15–41)
Albumin: 3.7 g/dL (ref 3.5–5.0)
Alkaline Phosphatase: 116 U/L (ref 38–126)
Anion gap: 4 — ABNORMAL LOW (ref 5–15)
BUN: 14 mg/dL (ref 8–23)
CO2: 30 mmol/L (ref 22–32)
Calcium: 8.8 mg/dL — ABNORMAL LOW (ref 8.9–10.3)
Chloride: 105 mmol/L (ref 98–111)
Creatinine: 0.67 mg/dL (ref 0.61–1.24)
GFR, Estimated: >60 mL/min (ref >60)
Glucose, Bld: 153 mg/dL — ABNORMAL HIGH (ref 70–99)
Potassium: 3.8 mmol/L (ref 3.5–5.1)
Sodium: 139 mmol/L (ref 135–145)
Total Bilirubin: 0.8 mg/dL (ref 0.0–1.2)
Total Protein: 6.7 g/dL (ref 6.5–8.1)

## 2023-08-07 LAB — CBC WITH DIFFERENTIAL (CANCER CENTER ONLY)
Abs Immature Granulocytes: 0.04 10*3/uL (ref 0.00–0.07)
Basophils Absolute: 0 10*3/uL (ref 0.0–0.1)
Basophils Relative: 1 %
Eosinophils Absolute: 0.1 10*3/uL (ref 0.0–0.5)
Eosinophils Relative: 3 %
HCT: 30.2 % — ABNORMAL LOW (ref 39.0–52.0)
Hemoglobin: 10.4 g/dL — ABNORMAL LOW (ref 13.0–17.0)
Immature Granulocytes: 2 %
Lymphocytes Relative: 26 %
Lymphs Abs: 0.7 10*3/uL (ref 0.7–4.0)
MCH: 30.8 pg (ref 26.0–34.0)
MCHC: 34.4 g/dL (ref 30.0–36.0)
MCV: 89.3 fL (ref 80.0–100.0)
Monocytes Absolute: 0.6 10*3/uL (ref 0.1–1.0)
Monocytes Relative: 22 %
Neutro Abs: 1.2 10*3/uL — ABNORMAL LOW (ref 1.7–7.7)
Neutrophils Relative %: 46 %
Platelet Count: 288 10*3/uL (ref 150–400)
RBC: 3.38 MIL/uL — ABNORMAL LOW (ref 4.22–5.81)
RDW: 25.3 % — ABNORMAL HIGH (ref 11.5–15.5)
WBC Count: 2.6 10*3/uL — ABNORMAL LOW (ref 4.0–10.5)
nRBC: 3.1 % — ABNORMAL HIGH (ref 0.0–0.2)

## 2023-08-07 MED ORDER — SODIUM CHLORIDE 0.9% FLUSH
10.0000 mL | Freq: Once | INTRAVENOUS | Status: AC
  Administered 2023-08-07: 10 mL

## 2023-08-07 MED ORDER — HEPARIN SOD (PORK) LOCK FLUSH 100 UNIT/ML IV SOLN
250.0000 [IU] | Freq: Once | INTRAVENOUS | Status: AC
  Administered 2023-08-07: 250 [IU]

## 2023-08-08 ENCOUNTER — Other Ambulatory Visit: Payer: Self-pay | Admitting: Hematology

## 2023-08-10 ENCOUNTER — Other Ambulatory Visit: Payer: Self-pay

## 2023-08-10 ENCOUNTER — Telehealth: Payer: Self-pay

## 2023-08-10 ENCOUNTER — Other Ambulatory Visit: Payer: Self-pay | Admitting: Radiology

## 2023-08-10 ENCOUNTER — Encounter: Payer: Self-pay | Admitting: Nurse Practitioner

## 2023-08-10 ENCOUNTER — Encounter: Payer: Self-pay | Admitting: Hematology

## 2023-08-10 ENCOUNTER — Ambulatory Visit (HOSPITAL_COMMUNITY)
Admission: RE | Admit: 2023-08-10 | Discharge: 2023-08-10 | Disposition: A | Discharge status: Home / Self Care | Source: Ambulatory Visit | Attending: Radiology | Admitting: Radiology

## 2023-08-10 DIAGNOSIS — K9423 Gastrostomy malfunction: Secondary | ICD-10-CM

## 2023-08-10 HISTORY — PX: IR RADIOLOGIST EVAL & MGMT: IMG5224

## 2023-08-10 MED ORDER — NYSTATIN 100000 UNIT/ML MT SUSP: 10.0000 mL | Freq: Four times a day (QID) | OROMUCOSAL | 0 refills | Status: DC

## 2023-08-10 NOTE — Procedures (Signed)
 Mr Sadowski came to IR today with a leaking G tube.  The G-tube its self looked to be in good condition and flushed easily with 20 ml of normal saline.  The bumper was pushed off his skin 3 cm which created a gap causing the leaking from the G tube.  He stated he had lost 60 lbs. It is reasonable that this is the cause of the gap. The bumper was moved down to his skin removing the gap and leaking around the tube.  A clean dry dressing was applied to the G tube. He was discharge home.

## 2023-08-10 NOTE — Telephone Encounter (Signed)
 Patient called in stating he is having problems with his feeding tube. He stated he is getting air in the tube and its making a popping noise in his stomach, he also stated that its not flowing like it was. He is having some pain and he can taste the milk that he is using. He stated he can see more of the tube now when he looks at it. Contacted Tiffany Feek in IR she contacted [employer] PA and he stated he would contact the patient about his issues.

## 2023-08-13 ENCOUNTER — Encounter: Payer: Self-pay | Admitting: Hematology

## 2023-08-13 ENCOUNTER — Other Ambulatory Visit: Payer: Self-pay | Admitting: Radiology

## 2023-08-13 DIAGNOSIS — K9423 Gastrostomy malfunction: Secondary | ICD-10-CM

## 2023-08-14 ENCOUNTER — Ambulatory Visit (HOSPITAL_COMMUNITY)
Admission: RE | Admit: 2023-08-14 | Discharge: 2023-08-14 | Disposition: A | Discharge status: Home / Self Care | Source: Ambulatory Visit | Attending: Radiology | Admitting: Radiology

## 2023-08-14 ENCOUNTER — Encounter (HOSPITAL_COMMUNITY): Payer: Self-pay | Admitting: Radiology

## 2023-08-14 ENCOUNTER — Ambulatory Visit (HOSPITAL_COMMUNITY)
Admission: RE | Admit: 2023-08-14 | Discharge: 2023-08-14 | Disposition: A | Discharge status: Home / Self Care | Source: Ambulatory Visit | Attending: Hematology | Admitting: Hematology

## 2023-08-14 ENCOUNTER — Other Ambulatory Visit: Payer: Self-pay | Admitting: Hematology

## 2023-08-14 ENCOUNTER — Other Ambulatory Visit: Payer: Self-pay

## 2023-08-14 ENCOUNTER — Inpatient Hospital Stay

## 2023-08-14 ENCOUNTER — Ambulatory Visit (HOSPITAL_COMMUNITY)
Admission: RE | Admit: 2023-08-14 | Discharge: 2023-08-14 | Disposition: A | Discharge status: Home / Self Care | Source: Ambulatory Visit | Attending: Radiology

## 2023-08-14 DIAGNOSIS — Z452 Encounter for adjustment and management of vascular access device: Secondary | ICD-10-CM

## 2023-08-14 DIAGNOSIS — K9423 Gastrostomy malfunction: Secondary | ICD-10-CM | POA: Insufficient documentation

## 2023-08-14 HISTORY — PX: IR RADIOLOGIST EVAL & MGMT: IMG5224

## 2023-08-14 MED ORDER — HEPARIN SOD (PORK) LOCK FLUSH 100 UNIT/ML IV SOLN
500.0000 [IU] | Freq: Once | INTRAVENOUS | Status: AC
  Administered 2023-08-14: 500 [IU]

## 2023-08-14 MED ORDER — IOHEXOL 350 MG/ML SOLN
50.0000 mL | Freq: Once | INTRAVENOUS | Status: AC | PRN
Start: 2023-08-14 — End: 2023-08-14
  Administered 2023-08-14: 50 mL

## 2023-08-14 MED ORDER — IOPAMIDOL (ISOVUE-300) INJECTION 61%: 20.0000 mL | Freq: Once | INTRAVENOUS | Status: DC | PRN

## 2023-08-14 MED ORDER — IOHEXOL 300 MG/ML  SOLN
25.0000 mL | Freq: Once | INTRAMUSCULAR | Status: AC | PRN
  Administered 2023-08-14: 20 mL

## 2023-08-14 MED ORDER — SODIUM CHLORIDE 0.9% FLUSH
10.0000 mL | Freq: Once | INTRAVENOUS | Status: AC
  Administered 2023-08-14: 10 mL

## 2023-08-14 NOTE — Progress Notes (Signed)
 Patient ID: Vivian Okelley, male   DOB: 02-18-1955, 68 y.o.   MRN: [id number] Patient seen again in IR today for evaluation of PICC line and gastrostomy tube.  Chest x-ray today shows tip of PICC line in mid to distal SVC.  Appropriate to continue using per Dr. Johann.  Patient's G-tube has migrated inward on abdominal film.  Saline was removed from existing balloon, gastrostomy tube retracted and balloon refilled with 20 cc saline.  Balloon and outer disc were made taut to skin/abd wall. Tube flushed without difficulty.  Follow-up abdominal film showed appropriate placement of gastrostomy tube.  Oncology office notified of above.

## 2023-08-14 NOTE — Procedures (Signed)
  Allred, Darrell K, PA-C  Physician Assistant Radiology   Progress Notes    Signed   Date of Service: 08/14/2023 11:00 AM   Signed      Patient ID: Melvin Hudson, male   DOB: 1955-09-18, 68 y.o.   MRN: [id number] Patient seen again in IR today for evaluation of PICC line and gastrostomy tube.  Chest x-ray today shows tip of PICC line in mid to distal SVC.  Appropriate to continue using per Dr. Johann.  Patient's G-tube has migrated inward on abdominal film.  Saline was removed from existing balloon, gastrostomy tube retracted and balloon refilled with 20 cc saline.  Balloon and outer disc were made taut to skin/abd wall. Tube flushed without difficulty.  Follow-up abdominal film showed appropriate placement of gastrostomy tube.  Oncology office notified of above.

## 2023-08-18 ENCOUNTER — Other Ambulatory Visit (HOSPITAL_COMMUNITY): Payer: Self-pay

## 2023-08-18 ENCOUNTER — Other Ambulatory Visit: Payer: Self-pay | Admitting: Nurse Practitioner

## 2023-08-18 ENCOUNTER — Telehealth: Payer: Self-pay

## 2023-08-18 DIAGNOSIS — G4733 Obstructive sleep apnea (adult) (pediatric): Secondary | ICD-10-CM

## 2023-08-18 MED ORDER — HYDROCODONE-ACETAMINOPHEN 7.5-325 MG/15ML PO SOLN
15.0000 mL | Freq: Four times a day (QID) | ORAL | 0 refills | Status: DC | PRN
  Filled 2023-08-18: qty 473, 8d supply, fill #0

## 2023-08-18 NOTE — Telephone Encounter (Signed)
 Copied from CRM [id number]. Topic: Clinical - Medical Advice >> Aug 17, 2023  2:49 PM Leila C wrote: Reason for CRM: Patient [phone] asked if needs to do another sleep study, has not been using CPAP machine has been having radiation due to throat cancer. Patient lost a lot of weight about 80 lbs and is sleeping all night. Patient asked if he can outgrow the CPAP machine? Please advise and call back.  ATC x1 unable to reach patient . Will send patient a mychart message.   Tammy can you please advise .

## 2023-08-18 NOTE — Telephone Encounter (Signed)
 I have called and spoke with the patient regarding home sleep study. Pt has been scheduled ov to review and is aware. Nothing further needed.

## 2023-08-18 NOTE — Telephone Encounter (Signed)
 Yes we can recheck home sleep study. Set ov in 2-3 months to discuss results, video or in person.

## 2023-08-20 ENCOUNTER — Inpatient Hospital Stay: Attending: Hematology

## 2023-08-20 ENCOUNTER — Other Ambulatory Visit: Payer: Self-pay

## 2023-08-20 ENCOUNTER — Inpatient Hospital Stay: Admitting: Hematology

## 2023-08-20 VITALS — BP 126/74 | HR 86 | Temp 97.8°F | Resp 14 | Ht 74.0 in | Wt 242.8 lb

## 2023-08-20 DIAGNOSIS — Z79899 Other long term (current) drug therapy: Secondary | ICD-10-CM | POA: Diagnosis not present

## 2023-08-20 DIAGNOSIS — Z923 Personal history of irradiation: Secondary | ICD-10-CM | POA: Diagnosis not present

## 2023-08-20 DIAGNOSIS — C153 Malignant neoplasm of upper third of esophagus: Secondary | ICD-10-CM | POA: Insufficient documentation

## 2023-08-20 DIAGNOSIS — Z452 Encounter for adjustment and management of vascular access device: Secondary | ICD-10-CM

## 2023-08-20 DIAGNOSIS — Z86718 Personal history of other venous thrombosis and embolism: Secondary | ICD-10-CM | POA: Insufficient documentation

## 2023-08-20 DIAGNOSIS — Z7901 Long term (current) use of anticoagulants: Secondary | ICD-10-CM | POA: Insufficient documentation

## 2023-08-20 DIAGNOSIS — B370 Candidal stomatitis: Secondary | ICD-10-CM | POA: Diagnosis not present

## 2023-08-20 DIAGNOSIS — Z9221 Personal history of antineoplastic chemotherapy: Secondary | ICD-10-CM | POA: Insufficient documentation

## 2023-08-20 DIAGNOSIS — G4733 Obstructive sleep apnea (adult) (pediatric): Secondary | ICD-10-CM

## 2023-08-20 LAB — CMP (CANCER CENTER ONLY)
ALT: 12 U/L (ref 0–44)
AST: 17 U/L (ref 15–41)
Albumin: 3.7 g/dL (ref 3.5–5.0)
Alkaline Phosphatase: 106 U/L (ref 38–126)
Anion gap: 3 — ABNORMAL LOW (ref 5–15)
BUN: 12 mg/dL (ref 8–23)
CO2: 32 mmol/L (ref 22–32)
Calcium: 9.1 mg/dL (ref 8.9–10.3)
Chloride: 105 mmol/L (ref 98–111)
Creatinine: 0.71 mg/dL (ref 0.61–1.24)
GFR, Estimated: >60 mL/min (ref >60)
Glucose, Bld: 133 mg/dL — ABNORMAL HIGH (ref 70–99)
Potassium: 4 mmol/L (ref 3.5–5.1)
Sodium: 140 mmol/L (ref 135–145)
Total Bilirubin: 0.8 mg/dL (ref 0.0–1.2)
Total Protein: 6.5 g/dL (ref 6.5–8.1)

## 2023-08-20 LAB — CBC WITH DIFFERENTIAL (CANCER CENTER ONLY)
Abs Immature Granulocytes: 0.07 10*3/uL (ref 0.00–0.07)
Basophils Absolute: 0 10*3/uL (ref 0.0–0.1)
Basophils Relative: 1 %
Eosinophils Absolute: 0.2 10*3/uL (ref 0.0–0.5)
Eosinophils Relative: 5 %
HCT: 33.6 % — ABNORMAL LOW (ref 39.0–52.0)
Hemoglobin: 11.3 g/dL — ABNORMAL LOW (ref 13.0–17.0)
Immature Granulocytes: 2 %
Lymphocytes Relative: 22 %
Lymphs Abs: 0.9 10*3/uL (ref 0.7–4.0)
MCH: 30.7 pg (ref 26.0–34.0)
MCHC: 33.6 g/dL (ref 30.0–36.0)
MCV: 91.3 fL (ref 80.0–100.0)
Monocytes Absolute: 0.6 10*3/uL (ref 0.1–1.0)
Monocytes Relative: 15 %
Neutro Abs: 2.4 10*3/uL (ref 1.7–7.7)
Neutrophils Relative %: 55 %
Platelet Count: 259 10*3/uL (ref 150–400)
RBC: 3.68 MIL/uL — ABNORMAL LOW (ref 4.22–5.81)
RDW: 23.9 % — ABNORMAL HIGH (ref 11.5–15.5)
WBC Count: 4.2 10*3/uL (ref 4.0–10.5)
nRBC: 0.9 % — ABNORMAL HIGH (ref 0.0–0.2)

## 2023-08-20 MED ORDER — HEPARIN SOD (PORK) LOCK FLUSH 100 UNIT/ML IV SOLN
500.0000 [IU] | Freq: Once | INTRAVENOUS | Status: AC
Start: 2023-08-20 — End: 2023-08-20
  Administered 2023-08-20: 500 [IU]

## 2023-08-20 MED ORDER — SODIUM CHLORIDE 0.9% FLUSH
10.0000 mL | Freq: Once | INTRAVENOUS | Status: AC
  Administered 2023-08-20: 10 mL

## 2023-08-20 NOTE — Progress Notes (Signed)
 [facility]   [phone] [phone]   Clinic Follow up Note   Patient Care Team: Haze Kingfisher, MD as PCP - General (Family Medicine) Michele Richardson, DO as PCP - Cardiology (Cardiology) Clance, Francis HERO, MD as Referring Physician (Pulmonary Disease) Lonn Hicks, MD as Consulting Physician (Hematology and Oncology) [name], MD as Attending Physician (Radiation Oncology) [name], MD as Consulting Physician (Hematology and Oncology)  Date of Service:  08/20/2023  CHIEF COMPLAINT: f/u of esophageal cancer  CURRENT THERAPY:  Recovery from chemoradiation  Oncology History   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 -EGD on 05/27/2023 which showed a mass in the proximal esophagus, 2 to 3 cm below introitus.  Biopsy confirmed invasive squamous cell carcinoma.  -PET scan showed hypermetabolic lesion in the proximal esophagus, with a positive peritracheal lymph node.  No distant metastasis -Status post PEG feeding tube placement  -He started chemoRT on 06/24/2023 and completed on 08/03/23 -ED visit for chest pain on 06/29/2023  Assessment & Plan Esophageal cancer Status post chemoradiation completed on August 03, 2023. Currently on a liquid diet with feeding tube support. Experiencing difficulty with solid foods, such as applesauce, causing coughing. Tolerating liquids well. - Schedule endoscopy and PET scan in September to assess for residual cancer - Encourage gradual introduction of soft foods like mashed potatoes and yogurt in 2-3 weeks - Remove PICC line as he is tolerating oral liquids well - If he has residual disease next PET scan and endoscopy, will consider immunotherapy - Will check PD-L1 on his previous biopsy.  Oral thrush Persistent despite use of nystatin  mouthwash. No pain or difficulty swallowing saliva. - Continue nystatin   mouthwash  Acute DVT of left femoral vein Diagnosed via ultrasound on June 16, 2023. Currently on Eliquis . Due to inactivity, there is a continued risk for blood clots. - Continue Eliquis  for a total of six months, reassess in October  Plan - He is recovering well from chemo and radiation, tolerating liquid well, okay to try soft diet in about 2 weeks - Lab and follow-up in 4 weeks, I will order PET scan for him next visit. -I messaged pathology lab to order PD-L1 on his initial biopsy  -PICC line removal next week     SUMMARY OF ONCOLOGIC HISTORY: Oncology History  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HCC)  06/14/2013 Procedure   Biopsy of the tonsil confirmrf squamous cell carcinoma. 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  History of Present Illness Melvin Hudson is a 68 year old male with esophageal cancer  who presents for follow-up after completing chemoradiation.  He completed chemoradiation for esophageal cancer on August 03, 2023, and feels significantly better with increased energy levels. He avoids crowds and relies on a feeding tube for nutrition, as he is not consuming  solid foods. He drinks liquids without difficulty but coughs when consuming applesauce. He has not attempted solid foods.  He is on Eliquis  for a DVT discovered in the left femoral vein and left pulmonary area via ultrasound on June 16, 2023. He has been taking this medication since hospitalization.  He has oral thrush, treated with nystatin  mouthwash, which does not cause pain or discomfort. Swallowing saliva is not painful.  He has a PICC line in place but drinks sufficient liquids orally, including water  and apple juice. The feeding tube previously caused pain when inserted too far, but this has been addressed. He experiences some pain related to the feeding tube but no significant bowel pain.     All other systems were reviewed with the patient and are negative.  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 ENDOSCOPY;  Service: Endoscopy;  Laterality: Left;   COLONOSCOPY N/A 04/20/2014   Procedure: COLONOSCOPY;  Surgeon: Jerrell KYM Sol, MD;  Location: [facility] ENDOSCOPY;  Service: Endoscopy;  Laterality: N/A;   COLONOSCOPY W/ BIOPSIES AND POLYPECTOMY     benign   DIRECT LARYNGOSCOPY  01/05/2014   DIRECT LARYNGOSCOPY N/A 01/05/2014   Procedure: DIRECT LARYNGOSCOPY AND BIOPSY;  Surgeon: Ida Loader, MD;  Location: [facility] OR;   Service: ENT;  Laterality: N/A;   ESOPHAGOSCOPY WITH DILITATION N/A 05/27/2023   Procedure: ESOPHAGOSCOPY;  Surgeon: Loader Ida, MD;  Location: [facility] OR;  Service: ENT;  Laterality: N/A;   IR GASTROSTOMY TUBE MOD SED  06/12/2023   IR RADIOLOGIST EVAL & MGMT  08/10/2023   IR RADIOLOGIST EVAL & MGMT  08/14/2023   KNEE ARTHROSCOPY Right ~ 1994   LEFT AND RIGHT HEART CATHETERIZATION WITH CORONARY/GRAFT ANGIOGRAM N/A 12/31/2010   Procedure: LEFT AND RIGHT HEART CATHETERIZATION WITH EL BILE;  Surgeon: Erick JONELLE Bergamo, MD;  Location: [facility] CATH LAB;  Service: Cardiovascular;  Laterality: N/A;   MULTIPLE EXTRACTIONS WITH ALVEOLOPLASTY N/A 07/08/2013   Procedure: Extraction of tooth #'s 22, 27 with alveoloplasty and bilateal mandibular facial exostoses reductions;  Surgeon: Tanda JULIANNA Fanny, DDS;  Location: [facility] ORS;  Service: Oral Surgery;  Laterality: N/A;   MULTIPLE TOOTH EXTRACTIONS  ~ 2008    I have reviewed the social history and family history with the patient and they are unchanged from previous note.  ALLERGIES:  is allergic to azilsartan and advil [ibuprofen].  MEDICATIONS:  Current Outpatient Medications  Medication Sig Dispense Refill   albuterol  (VENTOLIN  HFA) 108 (90 Base) MCG/ACT inhaler Inhale 2  puffs into the lungs every 4 (four) hours as needed for wheezing (Cough).     allopurinol  (ZYLOPRIM ) 300 MG tablet Take 300 mg by mouth every other day.     apixaban  (ELIQUIS ) 5 MG TABS tablet Take 1 tablet (5 mg total) by mouth 2 (two) times daily. 60 tablet 1   APIXABAN  (ELIQUIS ) VTE STARTER PACK (10MG  AND 5MG ) Take as directed on package: start with two-5mg  tablets twice daily for 7 days. On day 8, switch to one-5mg  tablet twice daily. 74 each 0   atorvastatin  (LIPITOR) 40 MG tablet Take 1 tablet (40 mg total) by mouth at bedtime. (Patient taking differently: Take 40 mg by mouth in the morning.) 90 tablet 1   bismuth subsalicylate (PEPTO BISMOL) 262 MG/15ML suspension Take 30 mLs  by mouth every 6 (six) hours as needed for indigestion or diarrhea or loose stools.     Blood Glucose Monitoring Suppl (ONETOUCH VERIO FLEX SYSTEM) w/Device KIT USE TO CHECK BLOOD SUGAR TWICE DAILY     calcium  carbonate (TUMS - DOSED IN MG ELEMENTAL CALCIUM ) 500 MG chewable tablet Chew 2 tablets (400 mg of elemental calcium  total) by mouth 3 (three) times daily with meals. (Patient taking differently: Chew 400 mg of elemental calcium  by mouth daily as needed.) 180 tablet 1   carvedilol  (COREG ) 3.125 MG tablet Take 3.125 mg by mouth 2 (two) times daily with a meal.     clonazePAM  (KLONOPIN ) 1 MG disintegrating tablet Take 1 mg by mouth daily.     diphenhydrAMINE  (BENADRYL ) 25 MG tablet Take 1 tablet (25 mg total) by mouth every 6 (six) hours. (Patient taking differently: Take 25 mg by mouth every 6 (six) hours as needed for allergies or sleep.) 20 tablet 0   famotidine  (PEPCID ) 40 MG/5ML suspension Place 2.5 mLs (20 mg total) into feeding tube daily. 50 mL 1   gabapentin  (NEURONTIN ) 300 MG capsule Take 300 mg by mouth 3 (three) times daily.   2   HYDROcodone -acetaminophen  (HYCET) 7.5-325 mg/15 ml solution Take 15 mLs by mouth 4 (four) times daily as needed for moderate pain (pain score 4-6). 473 mL 0   hydrocortisone  (ANUSOL -HC) 2.5 % rectal cream Place 1 Application rectally daily as needed for hemorrhoids or anal itching.     isosorbide -hydrALAZINE  (BIDIL ) 20-37.5 MG tablet Take 1 tablet by mouth in the morning and at bedtime. 90 tablet 3   lactose free nutrition (BOOST) LIQD Take 237 mLs by mouth daily.     lidocaine  (XYLOCAINE ) 2 % solution Patient: Mix 1part 2% viscous lidocaine , 1part H20. Swallow 10mL of diluted mixture, before meals and at bedtime, up to QID 200 mL 3   LORazepam  (ATIVAN ) 0.5 MG tablet Take 1 tablet PRN claustrophobia 20 minutes prior to radiation. Will need a driver. Use only as needed. 5 tablet 0   nystatin  (MYCOSTATIN ) 100000 UNIT/ML suspension SHAKE LIQUID AND TAKE 10  ML BY MOUTH FOUR TIMES DAILY FOR THRUSH. STOP WHEN THRUSH RESOLVES 473 mL 2   nystatin  (MYCOSTATIN ) 100000 UNIT/ML suspension Take 10 mLs (1,000,000 Units total) by mouth 4 (four) times daily. For oral thrush. Stop when thrush resolves 473 mL 0   omeprazole  (PRILOSEC) 20 MG capsule Take 20 mg by mouth daily.   4   ondansetron  (ZOFRAN ) 8 MG tablet Take 1 tablet (8 mg total) by mouth every 8 (eight) hours as needed for nausea or vomiting. Start on the third day after chemotherapy. 30 tablet 1   ONETOUCH VERIO test strip USE TO  CHECK BLOOD SUGAR TWICE DAILY     polyethylene glycol (MIRALAX  / GLYCOLAX ) 17 g packet Take 17 g by mouth daily. (Patient taking differently: Take 17 g by mouth daily as needed.) 14 each 0   prochlorperazine  (COMPAZINE ) 10 MG tablet Take 1 tablet (10 mg total) by mouth every 6 (six) hours as needed for nausea or vomiting. 30 tablet 1   Protein (FEEDING SUPPLEMENT, PROSOURCE TF20,) liquid Place 60 mLs into feeding tube 2 (two) times daily.     psyllium (HYDROCIL/METAMUCIL) 95 % PACK Take 1 packet by mouth daily. (Patient taking differently: Take 1 packet by mouth daily as needed for mild constipation or moderate constipation.) 240 each 0   senna-docusate (SENOKOT-S) 8.6-50 MG tablet Take 1 tablet by mouth 2 (two) times daily. (Patient taking differently: Take 1 tablet by mouth daily as needed for mild constipation or moderate constipation.) 60 tablet 1   Water  For Irrigation, Sterile (FREE WATER ) SOLN Place 100 mLs into feeding tube every 6 (six) hours.     No current facility-administered medications for this visit.    PHYSICAL EXAMINATION: ECOG PERFORMANCE STATUS: 2 - Symptomatic, <50% confined to bed  Vitals:   08/20/23 1241  BP: 126/74  Pulse: 86  Resp: 14  Temp: 97.8 F (36.6 C)  SpO2: 98%   Wt Readings from Last 3 Encounters:  08/20/23 242 lb 12.8 oz (110.1 kg)  08/07/23 239 lb 4.8 oz (108.5 kg)  07/29/23 241 lb 6.4 oz (109.5 kg)     GENERAL:alert, no  distress and comfortable SKIN: skin color, texture, turgor are normal, no rashes or significant lesions EYES: normal, Conjunctiva are pink and non-injected, sclera clear NECK: supple, thyroid  normal size, non-tender, without nodularity LYMPH:  no palpable lymphadenopathy in the cervical, axillary  LUNGS: clear to auscultation and percussion with normal breathing effort HEART: regular rate & rhythm and no murmurs and no lower extremity edema ABDOMEN:abdomen soft, non-tender and normal bowel sounds Musculoskeletal:no cyanosis of digits and no clubbing  NEURO: alert & oriented x 3 with fluent speech, no focal motor/sensory deficits  Physical Exam ABDOMEN: Feeding tube site clean, no discharge.  LABORATORY DATA:  I have reviewed the data as listed    Latest Ref Rng & Units 08/20/2023   12:18 PM 08/07/2023   11:59 AM 07/29/2023    8:26 AM  CBC  WBC 4.0 - 10.5 K/uL 4.2  2.6  1.5   Hemoglobin 13.0 - 17.0 g/dL 88.6  89.5  9.4   Hematocrit 39.0 - 52.0 % 33.6  30.2  26.3   Platelets 150 - 400 K/uL 259  288  159         Latest Ref Rng & Units 08/20/2023   12:18 PM 08/07/2023   11:59 AM 07/29/2023    8:26 AM  CMP  Glucose 70 - 99 mg/dL 866  846  856   BUN 8 - 23 mg/dL 12  14  14    Creatinine 0.61 - 1.24 mg/dL 9.28  9.32  9.30   Sodium 135 - 145 mmol/L 140  139  137   Potassium 3.5 - 5.1 mmol/L 4.0  3.8  4.1   Chloride 98 - 111 mmol/L 105  105  102   CO2 22 - 32 mmol/L 32  30  31   Calcium  8.9 - 10.3 mg/dL 9.1  8.8  9.0   Total Protein 6.5 - 8.1 g/dL 6.5  6.7  6.6   Total Bilirubin 0.0 - 1.2 mg/dL 0.8  0.8  1.0   Alkaline Phos 38 - 126 U/L 106  116  113   AST 15 - 41 U/L 17  16  15    ALT 0 - 44 U/L 12  11  13        RADIOGRAPHIC STUDIES: I have personally reviewed the radiological images as listed and agreed with the findings in the report. No results found.    No orders of the defined types were placed in this encounter.  All questions were answered. The patient knows to call  the clinic with any problems, questions or concerns. No barriers to learning was detected. The total time spent in the appointment was 25 minutes, including review of chart and various tests results, discussions about plan of care and coordination of care plan     Onita Mattock, MD 08/20/2023

## 2023-08-20 NOTE — Assessment & Plan Note (Signed)
-  cTxN1M0, invasive squamous cell carcinoma -Present with progressive dysphagia since December 2024. -EGD on 05/27/2023 which showed a mass in the proximal esophagus, 2 to 3 cm below introitus.  Biopsy confirmed invasive squamous cell carcinoma.  -PET scan showed hypermetabolic lesion in the proximal esophagus, with a positive peritracheal lymph node.  No distant metastasis -Status post PEG feeding tube placement  -He started chemoRT on 06/24/2023 and completed on 08/03/23 -ED visit for chest pain on 06/29/2023

## 2023-08-23 ENCOUNTER — Encounter: Payer: Self-pay | Admitting: Hematology

## 2023-08-23 ENCOUNTER — Encounter: Payer: Self-pay | Admitting: Nurse Practitioner

## 2023-08-24 ENCOUNTER — Telehealth: Payer: Self-pay | Admitting: Hematology

## 2023-08-24 ENCOUNTER — Other Ambulatory Visit: Payer: Self-pay

## 2023-08-24 NOTE — Telephone Encounter (Signed)
 Scheduled appointments per los. Talked with the patient and he is aware of the made appointments.

## 2023-08-28 ENCOUNTER — Inpatient Hospital Stay

## 2023-08-28 NOTE — Patient Instructions (Signed)
 PICC Removal, Adult, Care After The following information offers guidance on how to care for yourself after your procedure. Your health care provider may also give you more specific instructions. If you have problems or questions, contact your health care provider. What can I expect after the procedure? After the procedure, it is common to have: Tenderness or soreness. Redness, swelling, or a scab at the place where your PICC was removed (exit site). Follow these instructions at home: For the first 24 hours after the procedure: Keep the bandage (dressing) on your exit site clean and dry. Do not remove your dressing until your health care provider tells you to do so. Do not lift anything heavy or do activities that require great effort until your health care provider says it is okay. You should avoid: Lifting weights. Doing yard work. Doing any physical activity with repetitive arm movement. Watch closely for any signs of an air bubble in the vein (air embolism). This is a rare but serious complication. Signs of an air embolism include trouble breathing, wheezing, chest pain, or a fast pulse. If you have signs of an air embolism, call 911 right away and lie down on your left side to keep the air from moving into your lungs. After 24 hours have passed:  Remove your dressing as told by your health care provider. Wash your hands with soap and water for at least 20 seconds before and after you change your dressing. If soap and water are not available, use hand sanitizer. Return to your normal activities as told by your health care provider. A small scab may develop over the exit site. Do not pick at the scab. When bathing or showering, gently wash the exit site with soap and water. Pat it dry. Watch for signs of infection, such as: A fever or chills. Swollen glands under your arm. More redness, swelling, or soreness around your arm. Blood, fluid, or pus coming from your exit site. Warmth or a  bad smell coming from your exit site. A red streak spreading away from your exit site. General instructions Take over-the-counter and prescription medicines only as told by your health care provider. Do not take any new medicines without checking with your health care provider first. If you were given an antibiotic ointment, apply it as told by your health care provider. Keep all follow-up visits. This is important. Contact a health care provider if: You have a fever or chills. You have swelling at your exit site or swollen glands under your arm. You have signs of infection at your exit site. You have soreness, redness, or swelling in your arm that gets worse. Get help right away if: You have numbness or tingling in your fingers, hand, or arm. Your arm looks blue and feels cold. You have signs of an air embolism, such as trouble breathing, wheezing, chest pain, or a fast pulse. These symptoms may be an emergency. Get medical help right away. Call 911. Do not wait to see if the symptoms will go away. Do not drive yourself to the hospital. Summary After a PICC is removed, it is common to have tenderness or soreness, redness, swelling, or a scab at the exit site. Keep the bandage (dressing) over the exit site clean and dry. Do not remove the dressing until your health care provider tells you to do so. Do not lift anything heavy or do activities that require great effort until your health care provider says it is okay. Watch closely for any signs  of an air bubble (air embolism). If you have signs of an air embolism, call 911 right away and lie down on your left side. This information is not intended to replace advice given to you by your health care provider. Make sure you discuss any questions you have with your health care provider. Document Revised: 08/22/2020 Document Reviewed: 08/22/2020 Elsevier Patient Education  2024 [employer].

## 2023-08-28 NOTE — Progress Notes (Signed)
 Melvin Hudson presented to clinic for PICC line removal per MD order. Order verified and released. Vitals documented prior to removal. PICC line removed per institutional policy/procedure. Tip intact, noted 40cm of line removed. Direct pressure held for 5 minutes. Sterile, occlusive dressing placed. Patient instructed to leave dressing on for 24 hours. Discharge instructions reviewed. Patient remained supine for 30 minutes following removal. Patient discharged without complaints.

## 2023-08-30 ENCOUNTER — Other Ambulatory Visit: Payer: Self-pay | Admitting: Hematology

## 2023-08-31 ENCOUNTER — Other Ambulatory Visit: Payer: Self-pay | Admitting: Hematology

## 2023-08-31 ENCOUNTER — Other Ambulatory Visit: Payer: Self-pay | Admitting: Nurse Practitioner

## 2023-08-31 ENCOUNTER — Other Ambulatory Visit (HOSPITAL_COMMUNITY): Payer: Self-pay

## 2023-08-31 MED ORDER — HYDROCODONE-ACETAMINOPHEN 7.5-325 MG/15ML PO SOLN
15.0000 mL | Freq: Four times a day (QID) | ORAL | 0 refills | Status: DC | PRN
  Filled 2023-08-31: qty 473, 8d supply, fill #0

## 2023-08-31 NOTE — Progress Notes (Incomplete)
 Radiation Oncology         [phone]) [phone] ________________________________  Name: Melvin Hudson MRN: [id number]  Date: 09/01/2023  DOB: 03-Apr-1955  Follow-Up Visit Note  CC: Haze Kingfisher, MD  Haze Kingfisher, MD  Diagnosis and Prior Radiotherapy:    No diagnosis found. ***   Cancer Staging  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HCC) Staging form: Pharynx - Oropharynx, AJCC 7th Edition - Clinical: Stage IVA (T3, N2b, M0) - Unsigned  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) Staging form: Esophagus - Squamous Cell Carcinoma, AJCC 8th Edition - Clinical stage from 05/27/2023: Stage Unknown (cTX, cN1, cM0, GX) - Signed by [name], MD on 06/23/2023 Stage prefix: Initial diagnosis Histologic grading system: 3 grade system  ==========DELIVERED PLANS==========  First Treatment Date: 2023-06-24 Last Treatment Date: 2023-08-03   Plan Name: Esoph_Up_Init Site: Esophagus Technique: IMRT Mode: Photon Dose Per Fraction: 1.8 Gy Prescribed Dose (Delivered / Prescribed): 45 Gy / 45 Gy Prescribed Fxs (Delivered / Prescribed): 25 / 25   Plan Name: Esoph_Bst_Up Site: Esophagus Technique: IMRT Mode: Photon Dose Per Fraction: 1.8 Gy Prescribed Dose (Delivered / Prescribed): 5.4 Gy / 5.4 Gy Prescribed Fxs (Delivered / Prescribed): 3 / 3  cTx, N1, 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 He completed his treatment on 08/03/2023  ***                    ALLERGIES:  is allergic to azilsartan and advil [ibuprofen].  Meds: Current Outpatient Medications  Medication Sig Dispense Refill   albuterol  (VENTOLIN  HFA) 108 (90 Base) MCG/ACT inhaler Inhale 2 puffs into the lungs every 4 (four) hours as needed for wheezing (Cough).     allopurinol  (ZYLOPRIM ) 300 MG tablet Take  300 mg by mouth every other day.     apixaban  (ELIQUIS ) 5 MG TABS tablet Take 1 tablet (5 mg total) by mouth 2 (two) times daily. 60 tablet 1   APIXABAN  (ELIQUIS ) VTE STARTER PACK (10MG  AND 5MG ) Take as directed on package: start with two-5mg  tablets twice daily for 7 days. On day 8, switch to one-5mg  tablet twice daily. 74 each 0   atorvastatin  (LIPITOR) 40 MG tablet Take 1 tablet (40 mg total) by mouth at bedtime. (Patient taking differently: Take 40 mg by mouth in the morning.) 90 tablet 1   bismuth subsalicylate (PEPTO BISMOL) 262 MG/15ML suspension Take 30 mLs by mouth every 6 (six) hours as needed for indigestion or diarrhea or loose stools.     Blood Glucose Monitoring Suppl (ONETOUCH VERIO FLEX SYSTEM) w/Device KIT USE TO CHECK BLOOD SUGAR TWICE DAILY     calcium  carbonate (TUMS - DOSED IN MG ELEMENTAL CALCIUM ) 500 MG chewable tablet Chew 2 tablets (400 mg of elemental calcium  total) by mouth 3 (three) times daily with meals. (Patient taking differently: Chew 400 mg of elemental calcium  by mouth daily as needed.) 180 tablet 1   carvedilol  (COREG ) 3.125 MG tablet Take 3.125 mg by mouth 2 (two) times daily with a meal.     clonazePAM  (KLONOPIN ) 1 MG disintegrating tablet Take 1 mg by mouth daily.     diphenhydrAMINE  (BENADRYL ) 25 MG tablet Take 1 tablet (25 mg total) by mouth every 6 (six) hours. (Patient taking differently: Take 25 mg by mouth every 6 (six) hours as needed for allergies or sleep.) 20  tablet 0   famotidine  (PEPCID ) 40 MG/5ML suspension PLACE 2.5 MILLILITERS INTO THE FEEDING TUBE DAILY 50 mL 1   gabapentin  (NEURONTIN ) 300 MG capsule Take 300 mg by mouth 3 (three) times daily.   2   HYDROcodone -acetaminophen  (HYCET) 7.5-325 mg/15 ml solution Take 15 mLs by mouth 4 (four) times daily as needed for moderate pain (pain score 4-6). 473 mL 0   hydrocortisone  (ANUSOL -HC) 2.5 % rectal cream Place 1 Application rectally daily as needed for hemorrhoids or anal itching.      isosorbide -hydrALAZINE  (BIDIL ) 20-37.5 MG tablet Take 1 tablet by mouth in the morning and at bedtime. 90 tablet 3   lactose free nutrition (BOOST) LIQD Take 237 mLs by mouth daily.     lidocaine  (XYLOCAINE ) 2 % solution Patient: Mix 1part 2% viscous lidocaine , 1part H20. Swallow 10mL of diluted mixture, before meals and at bedtime, up to QID 200 mL 3   LORazepam  (ATIVAN ) 0.5 MG tablet Take 1 tablet PRN claustrophobia 20 minutes prior to radiation. Will need a driver. Use only as needed. 5 tablet 0   nystatin  (MYCOSTATIN ) 100000 UNIT/ML suspension SHAKE LIQUID AND TAKE 10 ML BY MOUTH FOUR TIMES DAILY FOR THRUSH. STOP WHEN THRUSH RESOLVES 473 mL 2   nystatin  (MYCOSTATIN ) 100000 UNIT/ML suspension Take 10 mLs (1,000,000 Units total) by mouth 4 (four) times daily. For oral thrush. Stop when thrush resolves 473 mL 0   omeprazole  (PRILOSEC) 20 MG capsule Take 20 mg by mouth daily.   4   ondansetron  (ZOFRAN ) 8 MG tablet Take 1 tablet (8 mg total) by mouth every 8 (eight) hours as needed for nausea or vomiting. Start on the third day after chemotherapy. 30 tablet 1   ONETOUCH VERIO test strip USE TO CHECK BLOOD SUGAR TWICE DAILY     polyethylene glycol (MIRALAX  / GLYCOLAX ) 17 g packet Take 17 g by mouth daily. (Patient taking differently: Take 17 g by mouth daily as needed.) 14 each 0   prochlorperazine  (COMPAZINE ) 10 MG tablet Take 1 tablet (10 mg total) by mouth every 6 (six) hours as needed for nausea or vomiting. 30 tablet 1   Protein (FEEDING SUPPLEMENT, PROSOURCE TF20,) liquid Place 60 mLs into feeding tube 2 (two) times daily.     psyllium (HYDROCIL/METAMUCIL) 95 % PACK Take 1 packet by mouth daily. (Patient taking differently: Take 1 packet by mouth daily as needed for mild constipation or moderate constipation.) 240 each 0   senna-docusate (SENOKOT-S) 8.6-50 MG tablet Take 1 tablet by mouth 2 (two) times daily. (Patient taking differently: Take 1 tablet by mouth daily as needed for mild  constipation or moderate constipation.) 60 tablet 1   Water  For Irrigation, Sterile (FREE WATER ) SOLN Place 100 mLs into feeding tube every 6 (six) hours.     No current facility-administered medications for this visit.    Physical Findings: The patient is in no acute distress. Patient is alert and oriented. Wt Readings from Last 3 Encounters:  08/20/23 242 lb 12.8 oz (110.1 kg)  08/07/23 239 lb 4.8 oz (108.5 kg)  07/29/23 241 lb 6.4 oz (109.5 kg)    vitals were not taken for this visit. .  General: Alert and oriented, in no acute distress HEENT: Head is normocephalic. Extraocular movements are intact. Oropharynx is notable for *** Neck: Neck is notable for *** Skin: Skin in treatment fields shows satisfactory healing *** Heart: Regular in rate and rhythm with no murmurs, rubs, or gallops. Chest: Clear to auscultation bilaterally, with no  rhonchi, wheezes, or rales. Abdomen: Soft, nontender, nondistended, with no rigidity or guarding. Extremities: No cyanosis or edema. Lymphatics: see Neck Exam Psychiatric: Judgment and insight are intact. Affect is appropriate.   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 After the hand injection of enteric contrast, there is opacification of the stomach rugae with normal flow of contrast into the duodenum. IMPRESSION: Percutaneous gastrostomy tube in place with the tip in the stomach. Electronically Signed   By: Rogelia Myers M.D.   On: 08/14/2023 14:21   DG Chest 2 View Result Date:  08/14/2023 CLINICAL DATA:  Esophageal mass. EXAM: CHEST - 2 VIEW COMPARISON:  Jun 29, 2023. FINDINGS: The heart size and mediastinal contours are within normal limits. Right-sided PICC line is noted with distal tip in expected position of the SVC. Both lungs are clear. The visualized skeletal structures are unremarkable. IMPRESSION: No active cardiopulmonary disease. Electronically Signed   By: Lynwood Landy Raddle M.D.   On: 08/14/2023 13:32   DG Abd 1 View Result Date: 08/14/2023 CLINICAL DATA:  Gastrostomy tube placement. EXAM: ABDOMEN - 1 VIEW COMPARISON:  None Available. FINDINGS: Distal tip of gastrostomy tube appears to be in expected position of duodenum. Normal contrast filling of proximal small bowel is noted. No leakage is noted. No abnormal bowel dilatation. IMPRESSION: Distal tip of gastrostomy tube appears to be in expected position of duodenum. No abnormal bowel dilatation. Electronically Signed   By: Lynwood Landy Raddle M.D.   On: 08/14/2023 13:31   IR Radiologist Eval & Mgmt Result Date: 08/14/2023 Crawford Dorothyann POUR     08/14/2023  2:30 PM Allred, Harman POUR, PA-C Physician Assistant Radiology  Progress Notes   Signed  Date of Service: 08/14/2023 11:00 AM  Signed   Patient ID: Melvin Hudson, male   DOB: 05/03/1955, 67 y.o.   MRN: [id number] Patient seen again in IR today for evaluation of PICC line and gastrostomy tube.  Chest x-ray today shows tip of PICC line in mid to distal SVC.  Appropriate to continue using per Dr. Johann.  Patient's G-tube has migrated inward on abdominal film.  Saline was removed from existing balloon, gastrostomy tube retracted and balloon refilled with 20 cc saline.  Balloon and outer disc were made taut to skin/abd wall. Tube flushed without difficulty.  Follow-up abdominal film showed appropriate placement of gastrostomy tube.  Oncology office notified of above.       IR Radiologist Eval & Mgmt Result Date: 08/10/2023 Crawford Dorothyann POUR     08/10/2023  3:53 PM Mr Krupka  came to IR today with a leaking G tube.  The G-tube its self looked to be in good condition and flushed easily with 20 ml of normal saline.  The bumper was pushed off his skin 3 cm which created a gap causing the leaking from the G tube.  He stated he had lost 60 lbs. It is reasonable that this is the cause of the gap. The bumper was moved down to his  skin removing the gap and leaking around the tube.  A clean dry dressing was applied to the G tube. He was discharge home.     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  PEG tube: ***  3) Risk Factors: The patient has been educated about risk factors including alcohol  and tobacco abuse; they understand that avoidance of alcohol  and tobacco is important to prevent recurrences as well as other cancers  4) Swallowing: *** He continues to follow with SLP. He is scheduled to see Lupita on 09/04/2023.   5) Dental: Encouraged to continue regular followup with dentistry, and dental hygiene including fluoride rinses. ***  6) Thyroid  function:  Lab Results  Component Value Date   TSH 0.992 07/01/2023    7) Other: ***  8) Follow-up in *** months. Patient is scheduled to see Dr. Lanny on 09/17/2023 for routine follow-up. The patient was encouraged to call with any issues or questions before then.  On date of service, in total, I spent *** minutes on this encounter. Patient was seen in person. _____________________________________    Leeroy Due, PA-C

## 2023-09-01 ENCOUNTER — Inpatient Hospital Stay: Admitting: Dietician

## 2023-09-01 ENCOUNTER — Ambulatory Visit
Admission: RE | Admit: 2023-09-01 | Discharge: 2023-09-01 | Disposition: A | Discharge status: Home / Self Care | Source: Ambulatory Visit | Attending: Radiology | Admitting: Radiology

## 2023-09-01 VITALS — BP 151/98 | HR 77 | Temp 97.5°F | Resp 20 | Ht 74.0 in | Wt 253.8 lb

## 2023-09-01 DIAGNOSIS — C153 Malignant neoplasm of upper third of esophagus: Secondary | ICD-10-CM

## 2023-09-01 HISTORY — DX: Personal history of irradiation: Z92.3

## 2023-09-01 NOTE — Progress Notes (Addendum)
   The patient returns today for routine follow-up. He completed his treatment on 08/03/2023.      Pain issues, if any: He reports 4/10 pain to abdomen  Using a feeding tube?: Yes, but he experiences some pain related to the feeding tube.  Weight changes, if any:  Swallowing issues, if any: Yes,  Smoking or chewing tobacco? Denies Using fluoride toothpaste daily? Dentures Last ENT visit was on:  Other notable issues, if any:    BP (!) 151/98 (BP Location: Left Arm, Patient Position: Sitting, Cuff Size: Large)   Pulse 77   Temp (!) 97.5 F (36.4 C)   Resp 20   Ht 6' 2 (1.88 m)   Wt 253 lb 12.8 oz (115.1 kg)   SpO2 100%   BMI 32.59 kg/m

## 2023-09-02 ENCOUNTER — Encounter: Payer: Self-pay | Admitting: Radiology

## 2023-09-02 ENCOUNTER — Other Ambulatory Visit: Payer: Self-pay

## 2023-09-03 ENCOUNTER — Encounter: Admitting: Dietician

## 2023-09-03 ENCOUNTER — Telehealth: Payer: Self-pay | Admitting: Dietician

## 2023-09-03 NOTE — Telephone Encounter (Signed)
Attempted to contact patient via telephone for nutrition follow-up. Patient did not answer. Left VM with request for return call. Contact information provided.  

## 2023-09-03 NOTE — Telephone Encounter (Signed)
 Nutrition Follow-up:  Patient with esophageal cancer. Patient has completed concurrent chemoradiation therapy with weekly carboplatin  + paclitaxel  Final RT 6/16   S/p PEG 4/24 during hospital admission DME: [employer]  Received a return call from patient. Reports falling asleep after taking allergy medicine this morning and missed the call.   Patient is feeling fairly well besides current allergy issues. He has been trying oral intake. Tolerating smooth textures including cream of mushroom/chicken/tomato soups, [place] ice cream, coffee, popsicles. Recalls applesauce was too chunky and this got stuck going down. States he will be doing really well once he can eat solid foods. Taste is diminished, however able to taste most foods. Patient reports having more energy and gaining weight.   Patient continues to use tube. He is giving 5 cartons Nutren 1.5 as prescribed. Patient asking if he should continue with current regimen.    Medications: reviewed   Labs: 7/3 - glucose 133  Anthropometrics: Last wt 253 lb 12.8 oz on 7/15 - increased   6/11 - 241 lb 6.4 oz  6/4 - 238 lb 12.8 oz 5/28 - 240 lb 6.4 oz  5/21 - 238 lb 8 oz 5/14 - 239 lb 14.4 oz 5/7 - 246 lb 12 oz 5/2 - 250 lb 6.4 oz 4/27 - 252 lb 6.8 oz    NUTRITION DIAGNOSIS: Food and nutrition related knowledge deficit - improving    INTERVENTION:  Continue po trials - recommend small bites and chewing very well with new solids Given improving oral intake, recommend reducing TF by one carton. Pt to give 4 cartons Nutren 1.5 - reports understanding Will continue working with pt to wean from tube    MONITORING, EVALUATION, GOAL: wt trends, intake, TF   NEXT VISIT: Tuesday August 5 via telephone (pt aware)

## 2023-09-04 ENCOUNTER — Ambulatory Visit

## 2023-09-05 ENCOUNTER — Encounter

## 2023-09-10 ENCOUNTER — Other Ambulatory Visit: Payer: Self-pay | Admitting: Nurse Practitioner

## 2023-09-16 DIAGNOSIS — G4733 Obstructive sleep apnea (adult) (pediatric): Secondary | ICD-10-CM | POA: Diagnosis not present

## 2023-09-16 DIAGNOSIS — R069 Unspecified abnormalities of breathing: Secondary | ICD-10-CM | POA: Diagnosis not present

## 2023-09-17 ENCOUNTER — Other Ambulatory Visit: Payer: Self-pay

## 2023-09-17 ENCOUNTER — Inpatient Hospital Stay

## 2023-09-17 ENCOUNTER — Inpatient Hospital Stay (HOSPITAL_BASED_OUTPATIENT_CLINIC_OR_DEPARTMENT_OTHER): Admitting: Hematology

## 2023-09-17 VITALS — BP 124/80 | HR 81 | Temp 98.7°F | Resp 17 | Ht 74.0 in | Wt 260.1 lb

## 2023-09-17 DIAGNOSIS — C153 Malignant neoplasm of upper third of esophagus: Secondary | ICD-10-CM

## 2023-09-17 LAB — CMP (CANCER CENTER ONLY)
ALT: 12 U/L (ref 0–44)
AST: 17 U/L (ref 15–41)
Albumin: 3.7 g/dL (ref 3.5–5.0)
Alkaline Phosphatase: 110 U/L (ref 38–126)
Anion gap: 3 — ABNORMAL LOW (ref 5–15)
BUN: 9 mg/dL (ref 8–23)
CO2: 32 mmol/L (ref 22–32)
Calcium: 9 mg/dL (ref 8.9–10.3)
Chloride: 106 mmol/L (ref 98–111)
Creatinine: 0.68 mg/dL (ref 0.61–1.24)
GFR, Estimated: >60 mL/min (ref >60)
Glucose, Bld: 83 mg/dL (ref 70–99)
Potassium: 4.1 mmol/L (ref 3.5–5.1)
Sodium: 141 mmol/L (ref 135–145)
Total Bilirubin: 0.8 mg/dL (ref 0.0–1.2)
Total Protein: 6.5 g/dL (ref 6.5–8.1)

## 2023-09-17 LAB — CBC WITH DIFFERENTIAL (CANCER CENTER ONLY)
Abs Immature Granulocytes: 0.02 K/uL (ref 0.00–0.07)
Basophils Absolute: 0 K/uL (ref 0.0–0.1)
Basophils Relative: 1 %
Eosinophils Absolute: 1 K/uL — ABNORMAL HIGH (ref 0.0–0.5)
Eosinophils Relative: 20 %
HCT: 35.7 % — ABNORMAL LOW (ref 39.0–52.0)
Hemoglobin: 12.1 g/dL — ABNORMAL LOW (ref 13.0–17.0)
Immature Granulocytes: 0 %
Lymphocytes Relative: 18 %
Lymphs Abs: 0.9 K/uL (ref 0.7–4.0)
MCH: 30.9 pg (ref 26.0–34.0)
MCHC: 33.9 g/dL (ref 30.0–36.0)
MCV: 91.3 fL (ref 80.0–100.0)
Monocytes Absolute: 0.6 K/uL (ref 0.1–1.0)
Monocytes Relative: 12 %
Neutro Abs: 2.4 K/uL (ref 1.7–7.7)
Neutrophils Relative %: 49 %
Platelet Count: 164 K/uL (ref 150–400)
RBC: 3.91 MIL/uL — ABNORMAL LOW (ref 4.22–5.81)
RDW: 18 % — ABNORMAL HIGH (ref 11.5–15.5)
WBC Count: 4.9 K/uL (ref 4.0–10.5)
nRBC: 0 % (ref 0.0–0.2)

## 2023-09-17 NOTE — Assessment & Plan Note (Signed)
-  cTxN1M0, invasive squamous cell carcinoma -Present with progressive dysphagia since December 2024. -EGD on 05/27/2023 which showed a mass in the proximal esophagus, 2 to 3 cm below introitus.  Biopsy confirmed invasive squamous cell carcinoma.  -PET scan showed hypermetabolic lesion in the proximal esophagus, with a positive peritracheal lymph node.  No distant metastasis -Status post PEG feeding tube placement  -He started chemoRT on 06/24/2023 and completed on 08/03/23 -ED visit for chest pain on 06/29/2023

## 2023-09-17 NOTE — Progress Notes (Signed)
 [facility]   [phone] [phone]   Clinic Follow up Note   Patient Care Team: Haze Kingfisher, MD as PCP - General (Family Medicine) Michele Richardson, DO as PCP - Cardiology (Cardiology) Clance, Francis HERO, MD as Referring Physician (Pulmonary Disease) Lonn Hicks, MD as Consulting Physician (Hematology and Oncology) [name], MD as Attending Physician (Radiation Oncology) [name], MD as Consulting Physician (Hematology and Oncology)  Date of Service:  09/17/2023  CHIEF COMPLAINT: f/u of esophageal cancer  CURRENT THERAPY:  Observation  Oncology History   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 -EGD on 05/27/2023 which showed a mass in the proximal esophagus, 2 to 3 cm below introitus.  Biopsy confirmed invasive squamous cell carcinoma.  -PET scan showed hypermetabolic lesion in the proximal esophagus, with a positive peritracheal lymph node.  No distant metastasis -Status post PEG feeding tube placement  -He started chemoRT on 06/24/2023 and completed on 08/03/23 -ED visit for chest pain on 06/29/2023  Assessment & Plan Esophageal cancer, post-radiation, under surveillance Esophageal cancer post-radiation therapy completed in mid-June. Currently under surveillance. - Order PET scan one week before the visit - Schedule endoscopy with Dr. Jesus for late August or September - Consider immunotherapy if residual disease is present - Continue surveillance if no residual disease is found  Dysphagia secondary to esophageal cancer Dysphagia persists post-radiation with difficulty swallowing solid foods, better tolerance with liquids and soft foods. - Advise continuation of soft diet and liquids - Encourage small bites and slow swallowing - Recommend drinking water  with meals to aid swallowing  Pain related to esophageal cancer and  feeding tube Experiencing mild pain in the throat and stomach, possibly related to the feeding tube. Leakage around the tube is occasionally observed. - Recommend over-the-counter liquid Tylenol  for pain management, I do not plan to refill his narcotics.  Plan - He is clinically doing well, able to tolerate some soft food. - Will refer him back to ENT Dr. Jesus to repeat esophageal scope to evaluate his residual upper esophageal cancer - Follow-up in 6 weeks with lab and PET scan 1 week before      SUMMARY OF ONCOLOGIC HISTORY: Oncology History  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HCC)  06/14/2013 Procedure   Biopsy of the tonsil confirmrf squamous cell carcinoma. 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  History of Present Illness Melvin Hudson is a 68 year old male with esophageal cancer who presents for follow-up.  He attempts to eat soft foods like scrambled eggs and polished meat. Initially, he  experienced choking with scrambled eggs but now manages by cutting them into smaller pieces. Applesauce causes significant choking. He consumes liquids without difficulty and does not  experience nausea. He has gained some weight back.  He experiences pain in his throat and stomach and uses a feeding tube. There is a dark liquid discharge around the tube site, which his sister helps clean. He uses liquid hydrocodone  for pain management. He is on a blood thinner due to a previous blood clot during a hospital stay. No persistent cough or choking unless trying new foods.     All other systems were reviewed with the patient and are negative.  MEDICAL HISTORY:  Past Medical History:  Diagnosis Date   Angioedema 09/17/2011   tongue and lips   Arthritis    both of my legs and feet (01/05/2014)   CHF (congestive heart failure) (HCC)    Diabetes mellitus without complication (HCC)    Erectile dysfunction 04/20/2018   GI bleed    Heart murmur 04/20/2018   History of radiation therapy    06/24/2023- 08/03/2023 Dr. 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    SURGICAL HISTORY: Past Surgical History:  Procedure Laterality Date   COLONOSCOPY Left 04/11/2014   Procedure: COLONOSCOPY;  Surgeon: Elsie Cree, MD;  Location: [facility] ENDOSCOPY;  Service: Endoscopy;  Laterality: Left;   COLONOSCOPY N/A 04/20/2014   Procedure: COLONOSCOPY;  Surgeon: Jerrell KYM Sol, MD;  Location: [facility] ENDOSCOPY;  Service: Endoscopy;  Laterality: N/A;   COLONOSCOPY W/ BIOPSIES AND POLYPECTOMY     benign   DIRECT LARYNGOSCOPY  01/05/2014   DIRECT LARYNGOSCOPY N/A 01/05/2014   Procedure: DIRECT LARYNGOSCOPY AND BIOPSY;  Surgeon: Ida Loader, MD;  Location: [facility] OR;  Service: ENT;  Laterality: N/A;   ESOPHAGOSCOPY WITH DILITATION N/A 05/27/2023   Procedure: ESOPHAGOSCOPY;  Surgeon: Loader Ida, MD;  Location: [facility] OR;  Service: ENT;  Laterality: N/A;   IR GASTROSTOMY TUBE MOD SED  06/12/2023    IR RADIOLOGIST EVAL & MGMT  08/10/2023   IR RADIOLOGIST EVAL & MGMT  08/14/2023   KNEE ARTHROSCOPY Right ~ 1994   LEFT AND RIGHT HEART CATHETERIZATION WITH CORONARY/GRAFT ANGIOGRAM N/A 12/31/2010   Procedure: LEFT AND RIGHT HEART CATHETERIZATION WITH EL BILE;  Surgeon: Erick JONELLE Bergamo, MD;  Location: [facility] CATH LAB;  Service: Cardiovascular;  Laterality: N/A;   MULTIPLE EXTRACTIONS WITH ALVEOLOPLASTY N/A 07/08/2013   Procedure: Extraction of tooth #'s 22, 27 with alveoloplasty and bilateal mandibular facial exostoses reductions;  Surgeon: Tanda JULIANNA Fanny, DDS;  Location: [facility] ORS;  Service: Oral Surgery;  Laterality: N/A;   MULTIPLE TOOTH EXTRACTIONS  ~ 2008    I have reviewed the social history and family history with the patient and they are unchanged from previous note.  ALLERGIES:  is allergic to azilsartan and advil [ibuprofen].  MEDICATIONS:  Current Outpatient Medications  Medication Sig Dispense Refill   albuterol  (VENTOLIN  HFA) 108 (90 Base) MCG/ACT inhaler Inhale 2 puffs into the lungs every 4 (four) hours as needed for wheezing (Cough).     allopurinol  (ZYLOPRIM ) 300 MG tablet Take 300 mg by mouth every other day.     APIXABAN  (ELIQUIS ) VTE STARTER PACK (10MG  AND 5MG ) Take as directed on package: start with two-5mg   tablets twice daily for 7 days. On day 8, switch to one-5mg  tablet twice daily. 74 each 0   atorvastatin  (LIPITOR) 40 MG tablet Take 1 tablet (40 mg total) by mouth at bedtime. 90 tablet 1   bismuth subsalicylate (PEPTO BISMOL) 262 MG/15ML suspension Take 30 mLs by mouth every 6 (six) hours as needed for indigestion or diarrhea or loose stools.     Blood Glucose Monitoring Suppl (ONETOUCH VERIO FLEX SYSTEM) w/Device KIT USE TO CHECK BLOOD SUGAR TWICE DAILY     calcium  carbonate (TUMS - DOSED IN MG ELEMENTAL CALCIUM ) 500 MG chewable tablet Chew 2 tablets (400 mg of elemental calcium  total) by mouth 3 (three) times daily with meals. 180 tablet 1    carvedilol  (COREG ) 3.125 MG tablet Take 3.125 mg by mouth 2 (two) times daily with a meal.     clonazePAM  (KLONOPIN ) 1 MG disintegrating tablet Take 1 mg by mouth daily.     diphenhydrAMINE  (BENADRYL ) 25 MG tablet Take 1 tablet (25 mg total) by mouth every 6 (six) hours. 20 tablet 0   ELIQUIS  5 MG TABS tablet TAKE 1 TABLET(5 MG) BY MOUTH TWICE DAILY 60 tablet 1   famotidine  (PEPCID ) 40 MG/5ML suspension PLACE 2.5 MILLILITERS INTO THE FEEDING TUBE DAILY 50 mL 1   gabapentin  (NEURONTIN ) 300 MG capsule Take 300 mg by mouth 3 (three) times daily.   2   HYDROcodone -acetaminophen  (HYCET) 7.5-325 mg/15 ml solution Take 15 mLs by mouth 4 (four) times daily as needed for moderate pain (pain score 4-6). 473 mL 0   hydrocortisone  (ANUSOL -HC) 2.5 % rectal cream Place 1 Application rectally daily as needed for hemorrhoids or anal itching.     isosorbide -hydrALAZINE  (BIDIL ) 20-37.5 MG tablet Take 1 tablet by mouth in the morning and at bedtime. 90 tablet 3   lactose free nutrition (BOOST) LIQD Take 237 mLs by mouth daily. (Patient not taking: Reported on 09/01/2023)     lidocaine  (XYLOCAINE ) 2 % solution Patient: Mix 1part 2% viscous lidocaine , 1part H20. Swallow 10mL of diluted mixture, before meals and at bedtime, up to QID (Patient not taking: Reported on 09/01/2023) 200 mL 3   LORazepam  (ATIVAN ) 0.5 MG tablet Take 1 tablet PRN claustrophobia 20 minutes prior to radiation. Will need a driver. Use only as needed. (Patient not taking: Reported on 09/01/2023) 5 tablet 0   nystatin  (MYCOSTATIN ) 100000 UNIT/ML suspension SHAKE LIQUID AND TAKE 10 ML BY MOUTH FOUR TIMES DAILY FOR THRUSH. STOP WHEN THRUSH RESOLVES 473 mL 2   nystatin  (MYCOSTATIN ) 100000 UNIT/ML suspension Take 10 mLs (1,000,000 Units total) by mouth 4 (four) times daily. For oral thrush. Stop when thrush resolves 473 mL 0   omeprazole  (PRILOSEC) 20 MG capsule Take 20 mg by mouth daily.   4   ondansetron  (ZOFRAN ) 8 MG tablet Take 1 tablet (8 mg total)  by mouth every 8 (eight) hours as needed for nausea or vomiting. Start on the third day after chemotherapy. (Patient not taking: Reported on 09/01/2023) 30 tablet 1   ONETOUCH VERIO test strip USE TO CHECK BLOOD SUGAR TWICE DAILY     polyethylene glycol (MIRALAX  / GLYCOLAX ) 17 g packet Take 17 g by mouth daily. 14 each 0   prochlorperazine  (COMPAZINE ) 10 MG tablet Take 1 tablet (10 mg total) by mouth every 6 (six) hours as needed for nausea or vomiting. 30 tablet 1   Protein (FEEDING SUPPLEMENT, PROSOURCE TF20,) liquid Place 60 mLs into feeding tube 2 (two) times daily.  psyllium (HYDROCIL/METAMUCIL) 95 % PACK Take 1 packet by mouth daily. 240 each 0   senna-docusate (SENOKOT-S) 8.6-50 MG tablet Take 1 tablet by mouth 2 (two) times daily. 60 tablet 1   Water  For Irrigation, Sterile (FREE WATER ) SOLN Place 100 mLs into feeding tube every 6 (six) hours.     No current facility-administered medications for this visit.    PHYSICAL EXAMINATION: ECOG PERFORMANCE STATUS: 1 - Symptomatic but completely ambulatory  Vitals:   09/17/23 1014  BP: 124/80  Pulse: 81  Resp: 17  Temp: 98.7 F (37.1 C)  SpO2: 99%   Wt Readings from Last 3 Encounters:  09/17/23 260 lb 1.6 oz (118 kg)  09/01/23 253 lb 12.8 oz (115.1 kg)  08/20/23 242 lb 12.8 oz (110.1 kg)     GENERAL:alert, no distress and comfortable SKIN: skin color, texture, turgor are normal, no rashes or significant lesions EYES: normal, Conjunctiva are pink and non-injected, sclera clear NECK: supple, thyroid  normal size, non-tender, without nodularity LYMPH:  no palpable lymphadenopathy in the cervical, axillary  LUNGS: clear to auscultation and percussion with normal breathing effort HEART: regular rate & rhythm and no murmurs and no lower extremity edema ABDOMEN:abdomen soft, non-tender and normal bowel sounds Musculoskeletal:no cyanosis of digits and no clubbing  NEURO: alert & oriented x 3 with fluent speech, no focal motor/sensory  deficits  Physical Exam   LABORATORY DATA:  I have reviewed the data as listed    Latest Ref Rng & Units 09/17/2023    9:56 AM 08/20/2023   12:18 PM 08/07/2023   11:59 AM  CBC  WBC 4.0 - 10.5 K/uL 4.9  4.2  2.6   Hemoglobin 13.0 - 17.0 g/dL 87.8  88.6  89.5   Hematocrit 39.0 - 52.0 % 35.7  33.6  30.2   Platelets 150 - 400 K/uL 164  259  288         Latest Ref Rng & Units 09/17/2023    9:56 AM 08/20/2023   12:18 PM 08/07/2023   11:59 AM  CMP  Glucose 70 - 99 mg/dL 83  866  846   BUN 8 - 23 mg/dL 9  12  14    Creatinine 0.61 - 1.24 mg/dL 9.31  9.28  9.32   Sodium 135 - 145 mmol/L 141  140  139   Potassium 3.5 - 5.1 mmol/L 4.1  4.0  3.8   Chloride 98 - 111 mmol/L 106  105  105   CO2 22 - 32 mmol/L 32  32  30   Calcium  8.9 - 10.3 mg/dL 9.0  9.1  8.8   Total Protein 6.5 - 8.1 g/dL 6.5  6.5  6.7   Total Bilirubin 0.0 - 1.2 mg/dL 0.8  0.8  0.8   Alkaline Phos 38 - 126 U/L 110  106  116   AST 15 - 41 U/L 17  17  16    ALT 0 - 44 U/L 12  12  11        RADIOGRAPHIC STUDIES: I have personally reviewed the radiological images as listed and agreed with the findings in the report. No results found.    Orders Placed This Encounter  Procedures   NM PET Image Restag (PS) Skull Base To Thigh    Standing Status:   Future    Expected Date:   10/21/2023    Expiration Date:   09/16/2024    If indicated for the ordered procedure, I authorize the administration of a radiopharmaceutical per Radiology  protocol:   Yes    Preferred imaging location?:   [facility]   All questions were answered. The patient knows to call the clinic with any problems, questions or concerns. No barriers to learning was detected. The total time spent in the appointment was 30 minutes, including review of chart and various tests results, discussions about plan of care and coordination of care plan     Onita Mattock, MD 09/17/2023

## 2023-09-18 ENCOUNTER — Other Ambulatory Visit: Payer: Self-pay

## 2023-09-18 ENCOUNTER — Ambulatory Visit: Attending: Radiation Oncology

## 2023-09-18 DIAGNOSIS — R1312 Dysphagia, oropharyngeal phase: Secondary | ICD-10-CM | POA: Insufficient documentation

## 2023-09-18 NOTE — Progress Notes (Signed)
 Faxed Dr. Demetra office note Dr. Ida Loader w/[facility] [phone]  [phone].  Also, requested if Dr. Loader could repeat an Esophoscopy in late August or early September to evaluate residual disease.  Fax confirmation received and awaiting a return call from Dr. Godfrey office regarding esophoscopy request.

## 2023-09-18 NOTE — Therapy (Addendum)
 OUTPATIENT SPEECH LANGUAGE PATHOLOGY SWALLOW TREATMENT   Patient Name: Melvin Hudson MRN: [id number] DOB:[birth year], 68 y.o., male Today's Date: 09/18/2023  PCP: Haze Kingfisher, MD REFERRING PROVIDER: [name], MD  END OF SESSION:  End of Session - 09/18/23 0803     Visit Number 2    Number of Visits 7    Date for SLP Re-Evaluation 11/01/23    Authorization - Number of Visits 2    SLP Start Time 0803    SLP Stop Time  0840    SLP Time Calculation (min) 37 min    Activity Tolerance Patient tolerated treatment well          Past Medical History:  Diagnosis Date   Angioedema 09/17/2011   tongue and lips   Arthritis    both of my legs and feet (01/05/2014)   CHF (congestive heart failure) (HCC)    Diabetes mellitus without complication (HCC)    Erectile dysfunction 04/20/2018   GI bleed    Heart murmur 04/20/2018   History of radiation therapy    06/24/2023- 08/03/2023 Dr. 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   Past Surgical History:  Procedure Laterality Date   COLONOSCOPY Left 04/11/2014   Procedure: COLONOSCOPY;  Surgeon: Elsie Cree, MD;  Location: [facility] ENDOSCOPY;  Service: Endoscopy;  Laterality: Left;   COLONOSCOPY N/A 04/20/2014   Procedure: COLONOSCOPY;  Surgeon: Jerrell KYM Sol, MD;  Location: [facility] ENDOSCOPY;  Service: Endoscopy;  Laterality: N/A;   COLONOSCOPY W/ BIOPSIES AND POLYPECTOMY     benign   DIRECT LARYNGOSCOPY  01/05/2014   DIRECT LARYNGOSCOPY N/A 01/05/2014   Procedure: DIRECT LARYNGOSCOPY AND BIOPSY;  Surgeon: Ida Loader, MD;  Location[facility] OR;  Service: ENT;  Laterality: N/A;   ESOPHAGOSCOPY WITH DILITATION N/A 05/27/2023   Procedure: ESOPHAGOSCOPY;  Surgeon: Loader Ida, MD;  Location: [facility] OR;  Service: ENT;   Laterality: N/A;   IR GASTROSTOMY TUBE MOD SED  06/12/2023   IR RADIOLOGIST EVAL & MGMT  08/10/2023   IR RADIOLOGIST EVAL & MGMT  08/14/2023   KNEE ARTHROSCOPY Right ~ 1994   LEFT AND RIGHT HEART CATHETERIZATION WITH CORONARY/GRAFT ANGIOGRAM N/A 12/31/2010   Procedure: LEFT AND RIGHT HEART CATHETERIZATION WITH EL BILE;  Surgeon: Erick JONELLE Bergamo, MD;  Location: [facility] CATH LAB;  Service: Cardiovascular;  Laterality: N/A;   MULTIPLE EXTRACTIONS WITH ALVEOLOPLASTY N/A 07/08/2013   Procedure: Extraction of tooth #'s 22, 27 with alveoloplasty and bilateal mandibular facial exostoses reductions;  Surgeon: Tanda JULIANNA Fanny, DDS;  Location: [facility] ORS;  Service: Oral Surgery;  Laterality: N/A;   MULTIPLE TOOTH EXTRACTIONS  ~ 2008   Patient Active Problem List   Diagnosis Date Noted   Malnutrition of moderate degree 06/12/2023   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   Dysphagia 03/02/2023   Hypocalcemia 04/17/2022   Anxiety state 04/15/2022   AKI (acute kidney injury) (HCC) 04/15/2022   Chronic diastolic heart failure (HCC) 04/15/2022   Syncope 04/15/2022   Nonspecific abnormal electrocardiogram (ECG) (EKG) 04/15/2022   GI bleed 04/13/2022   PICC (peripherally inserted central catheter) flush 01/01/2022   Status post radiation therapy 12/05/2020   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   Edentulous 12/05/2020  Ill-fitting dentures 12/05/2020   Former smoker 11/11/2019   Healthcare maintenance 11/11/2019   Bilateral leg edema 04/21/2018   Heart murmur 04/20/2018   Erectile dysfunction 04/20/2018   Pure hypercholesterolemia 04/20/2018   Obesity, Class III, BMI 40-49.9 (morbid obesity)    BRBPR (bright red blood per rectum) 08/30/2016   Chronic pain syndrome    History of GI diverticular bleed 04/18/2014   Hematochezia 04/10/2014   Symptomatic anemia 04/08/2014   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  Pt accompanied by: self   PERTINENT HISTORY: See above. Tonsillar cancer 2015 Jakie thought rt-sided) treated with rad tx by Dr. Izell.   PAIN:  Are you having pain? No  FALLS: Has patient fallen in last 6 months?  No  PATIENT GOALS: Find out what's going on  OBJECTIVE:  Note: Objective measures were completed at Evaluation unless otherwise noted. OBJECTIVE:   DIAGNOSTIC FINDINGS:  DG ESOPHAGUS Jan 2025: IMPRESSION: Moderate persistent narrowing of esophagus at cervicothoracic junction. It is uncertain if this is due to external compression or stricture or mass. Endoscopy is recommended as well as potentially CT scan of the chest with intravenous contrast.   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  INSTRUMENTAL SWALLOW STUDY FINDINGS (MBSS) None noted  PATIENT REPORTED OUTCOME MEASURES (PROM): EAT-10: Pt completed 09/18/23 with EAT -10 score of 24/40 with higher scores indicating  pt's swallowing deficits negatively affect QoL.                                                                                                                             TREATMENT DATE:   09/18/23: I experiment with soft things that can go down easy and not choke me. Vienna sausage, oatmeal, soups, ice cream, eggs. Still having 4 tube feedings/day. Sometimes those eggs get a little too hard. I have to cook them  softer. Pt coughs eggs up and spits them out if they are too rubbery. SLP suggested scrambled eggs and adding cream or half and half to make them more wet, maybe having gravy with them. SLP also told pt to use gravy and other sauces to make food more slick for easier pharyngeal passage. He ate cereal bar and drank thin liquids today without any oral s/sx difficulty, or overt s/sx pharyngeal deficits. He has not been doing HEP. He req'd consistent mod A with Mendelsohn and effortful, and was independent at task completion.   08/02/24: SLP  educated pt on swallow exercises today including effortful swallow, and Mendelsohn maneuver. He req'd mod A faded to min A with both exercises. He was told to complete 20 swallows each day for each of these. SLP also provided information about aspiration PNA. See pt instructions.  PATIENT EDUCATION: Education details: see treatment date for today's date Person educated: Patient and sister Education method: Explanation, Demonstration, Verbal cues, and Handouts Education comprehension: verbalized understanding, returned demonstration, verbal cues required, and needs further education   ASSESSMENT:  CLINICAL IMPRESSION: Patient is a 68 y.o. M who was seen today for treatment following RT for esophageal cancer. SLP believes since pt's swallowing does not show s/sx of pharyngeal deficits pt does not require MBS at this time. This does not mean he will not need it for the future. Pt reported a year ago he did not have any s/sx of coughing with POs.  OBJECTIVE IMPAIRMENTS: include dysphagia. These impairments are limiting patient from safety when swallowing. Factors affecting potential to achieve goals and functional outcome are co-morbidities, previous level of function, and severity of impairments. Patient will benefit from skilled SLP services to address above impairments and improve overall function.  REHAB POTENTIAL: Good   GOALS: Goals reviewed with patient?  No  SHORT TERM GOALS: Target date: 09/04/23  Pt complete HEP with mod I  Baseline: Goal status: INITIAL  2.  Pt demo knowledge of sx of pharyngeal dysphagia Baseline:  Goal status: INITIAL  3.  Pt demo knowledge of overt s/sx aspiration over two sessions Baseline:  Goal status: INITIAL  4.  Pt will demo knowledge of how a food journal can assist return to pre-rad tx diet Baseline:  Goal status: INITIAL   LONG TERM GOALS: Target date: 11/01/23  Pt will undergo MBS when clinically appropriate Baseline:  Goal status: INITIAL  2.  Pt will complete HEP with mod I over two sessions Baseline:  Goal status: INITIAL  3.  Pt demo knowledge of overt s/sx aspiration PNA over two sessions Baseline:  Goal status: INITIAL  4.  Pt will improve score on PROM Baseline:  Goal status: INITIAL   PLAN:  SLP FREQUENCY: every other week  SLP DURATION: 6 sessions  PLANNED INTERVENTIONS: Aspiration precaution training, Pharyngeal strengthening exercises, Diet toleration management , Environmental controls, Trials of upgraded texture/liquids, Cueing hierachy, Internal/external aids, Oral motor exercises, SLP instruction and feedback, Compensatory strategies, Patient/family education, and [id number] Treatment of swallowing function    Heath Badon, CCC-SLP 09/18/2023, 8:53 AM

## 2023-09-22 ENCOUNTER — Telehealth: Payer: Self-pay | Admitting: Dietician

## 2023-09-22 ENCOUNTER — Encounter: Payer: Self-pay | Admitting: Adult Health

## 2023-09-22 ENCOUNTER — Inpatient Hospital Stay: Attending: Dietician | Admitting: Dietician

## 2023-09-22 NOTE — Telephone Encounter (Signed)
 Nutrition Follow-up:  Patient with esophageal cancer. Patient has completed concurrent chemoradiation therapy with weekly carboplatin  + paclitaxel  Final RT 6/16   Patient is doing great. Eating more orally. He wears dentures with some foods. Tolerating chicken noodle soup, potato chips, scrambled eggs, potted meat, vienna sausage, ice cream. Reports dysphagia with greens, baked fish. Patient is planning to try mini ravioli today. Drinking coffee and juice by mouth. The taste of juice is off. Patient giving 3 bottles of water  via tube. He is agreeable to start drinking more water  orally.   Patient continues to supplement with bolus feedings. Tolerating 4 cartons Nutren 1.5 (6AM, 10AM, 4PM, 8PM). Patient goes to bed within an hour of last feeding. He is wondering if the late feeding is causing too much wt gain. Patient does not feel he is eating enough to reduce bolus feedings to 3/day.   Medications: reviewed  Labs: 7/31 - reviewed   Anthropometrics: Wt 257 lb today (on home scale per pt) - increased   7/15 - 253 lb 12.8 oz 6/11 - 241 lb 6.4 oz  6/4 - 238 lb 12.8 oz   NUTRITION DIAGNOSIS: Food and nutrition related knowledge deficit - improving    INTERVENTION:  Encouraged continued po trials - reminded to take small bites and chew thoroughly Reviewed soft moist foods + sources of protein Suggested pt may tolerate more po with reducing supplemental TF. He is agreeable to try 3 cartons Nutren 1.5  Will continue working with pt to wean from tube    MONITORING, EVALUATION, GOAL: wt trends, intake, TF    NEXT VISIT: Tuesday August 19 via telephone (pt aware)

## 2023-09-24 ENCOUNTER — Ambulatory Visit: Payer: Self-pay | Admitting: Adult Health

## 2023-09-25 ENCOUNTER — Encounter: Payer: Self-pay | Admitting: Hematology

## 2023-10-02 ENCOUNTER — Encounter

## 2023-10-06 ENCOUNTER — Telehealth: Payer: Self-pay | Admitting: Dietician

## 2023-10-06 ENCOUNTER — Inpatient Hospital Stay: Admitting: Dietician

## 2023-10-06 NOTE — Telephone Encounter (Signed)
 Nutrition Follow-up:  Patient with esophageal cancer. Patient has completed concurrent chemoradiation therapy with weekly carboplatin  + paclitaxel  Final RT 6/16   Spoke with patient via telephone. He is doing well today. Patient is eating more orally. Tolerating mechanical soft/moist foods. Patient says he is unable to eat with his teeth in secondary to inability to push food down if it gets stuck. Patient really likes chips as these taste good. Eats these with potted meat. Patient planning to have ravioli for lunch and boiled potatoes/onions topped with cream of mushroom soup for dinner. Eating a bowl of ice cream most evenings.   Patient is supplementing with tube feeds. Reports most days doing 3, sometimes does 4 if he does not eat enough orally.   Medications: reviewed   Labs: no new labs   Anthropometrics: 261.8 lb today on home scale. This is increased from reported 257 lb (home scale) on 8/5  7/15 - 253 lb 12.8 oz 6/11 - 241 lb 6.4 oz   NUTRITION DIAGNOSIS: Food and nutrition related knowledge deficit improving    INTERVENTION:  Continue weaning from tube. Recommend decreasing to 2 cartons Nutren 1.5. Pt to give 3 if po is poor Continue soft moist textures. Chew food well prior to swallow    MONITORING, EVALUATION, GOAL: wt trends, intake, TF   NEXT VISIT: Thursday September 11 after MD

## 2023-10-08 ENCOUNTER — Other Ambulatory Visit: Payer: Self-pay | Admitting: Hematology

## 2023-10-15 ENCOUNTER — Other Ambulatory Visit: Payer: Self-pay

## 2023-10-15 MED ORDER — NYSTATIN 100000 UNIT/ML MT SUSP
10.0000 mL | Freq: Four times a day (QID) | OROMUCOSAL | 0 refills | Status: DC
Start: 2023-10-15 — End: 2023-10-29

## 2023-10-16 ENCOUNTER — Ambulatory Visit

## 2023-10-16 DIAGNOSIS — R1312 Dysphagia, oropharyngeal phase: Secondary | ICD-10-CM

## 2023-10-16 NOTE — Therapy (Addendum)
 OUTPATIENT SPEECH LANGUAGE PATHOLOGY SWALLOW TREATMENT   Patient Name: Melvin Hudson MRN: [id number] DOB:04/18/1955, 68 y.o., male Today's Date: 10/16/2023  PCP: Haze Kingfisher, MD REFERRING PROVIDER: [name], MD  END OF SESSION:  End of Session - 10/16/23 0856     Visit Number 3    Number of Visits 7    Date for SLP Re-Evaluation 11/01/23    Authorization - Number of Visits 3    SLP Start Time 0804    SLP Stop Time  0850    SLP Time Calculation (min) 46 min    Activity Tolerance Patient tolerated treatment well           Past Medical History:  Diagnosis Date   Angioedema 09/17/2011   tongue and lips   Arthritis    both of my legs and feet (01/05/2014)   CHF (congestive heart failure) (HCC)    Diabetes mellitus without complication (HCC)    Erectile dysfunction 04/20/2018   GI bleed    Heart murmur 04/20/2018   History of radiation therapy    06/24/2023- 08/03/2023 Dr. 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   Past Surgical History:  Procedure Laterality Date   COLONOSCOPY Left 04/11/2014   Procedure: COLONOSCOPY;  Surgeon: Elsie Cree, MD;  Location: [facility] ENDOSCOPY;  Service: Endoscopy;  Laterality: Left;   COLONOSCOPY N/A 04/20/2014   Procedure: COLONOSCOPY;  Surgeon: Jerrell KYM Sol, MD;  Location: [facility] ENDOSCOPY;  Service: Endoscopy;  Laterality: N/A;   COLONOSCOPY W/ BIOPSIES AND POLYPECTOMY     benign   DIRECT LARYNGOSCOPY  01/05/2014   DIRECT LARYNGOSCOPY N/A 01/05/2014   Procedure: DIRECT LARYNGOSCOPY AND BIOPSY;  Surgeon: Ida Loader, MD;  Location: [facility] OR;  Service: ENT;  Laterality: N/A;   ESOPHAGOSCOPY WITH DILITATION N/A 05/27/2023   Procedure: ESOPHAGOSCOPY;  Surgeon: Loader Ida, MD;  Location: [facility] OR;  Service: ENT;   Laterality: N/A;   IR GASTROSTOMY TUBE MOD SED  06/12/2023   IR RADIOLOGIST EVAL & MGMT  08/10/2023   IR RADIOLOGIST EVAL & MGMT  08/14/2023   KNEE ARTHROSCOPY Right ~ 1994   LEFT AND RIGHT HEART CATHETERIZATION WITH CORONARY/GRAFT ANGIOGRAM N/A 12/31/2010   Procedure: LEFT AND RIGHT HEART CATHETERIZATION WITH EL BILE;  Surgeon: Erick JONELLE Bergamo, MD;  Location: [facility] CATH LAB;  Service: Cardiovascular;  Laterality: N/A;   MULTIPLE EXTRACTIONS WITH ALVEOLOPLASTY N/A 07/08/2013   Procedure: Extraction of tooth #'s 22, 27 with alveoloplasty and bilateal mandibular facial exostoses reductions;  Surgeon: Tanda JULIANNA Fanny, DDS;  Location: [facility] ORS;  Service: Oral Surgery;  Laterality: N/A;   MULTIPLE TOOTH EXTRACTIONS  ~ 2008   Patient Active Problem List   Diagnosis Date Noted   Malnutrition of moderate degree 06/12/2023   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   Dysphagia 03/02/2023   Hypocalcemia 04/17/2022   Anxiety state 04/15/2022   AKI (acute kidney injury) (HCC) 04/15/2022   Chronic diastolic heart failure (HCC) 04/15/2022   Syncope 04/15/2022   Nonspecific abnormal electrocardiogram (ECG) (EKG) 04/15/2022   GI bleed 04/13/2022   PICC (peripherally inserted central catheter) flush 01/01/2022   Status post radiation therapy 12/05/2020   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   Edentulous 12/05/2020  Ill-fitting dentures 12/05/2020   Former smoker 11/11/2019   Healthcare maintenance 11/11/2019   Bilateral leg edema 04/21/2018   Heart murmur 04/20/2018   Erectile dysfunction 04/20/2018   Pure hypercholesterolemia 04/20/2018   Obesity, Class III, BMI 40-49.9 (morbid obesity)    BRBPR (bright red blood per rectum) 08/30/2016   Chronic pain syndrome    History of GI diverticular bleed 04/18/2014   Hematochezia 04/10/2014   Symptomatic anemia 04/08/2014   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 And I am holding weight.  Pt accompanied by: self   PERTINENT HISTORY: See above. Tonsillar cancer 2015 Jakie thought rt-sided) treated with rad tx by Dr. Izell.   PAIN:  Are you having pain? No  FALLS: Has patient fallen in last 6 months?  No  PATIENT GOALS: Find out what's going on  OBJECTIVE:  Note: Objective measures were completed at Evaluation unless otherwise noted. OBJECTIVE:   DIAGNOSTIC FINDINGS:  DG ESOPHAGUS Jan 2025: IMPRESSION: Moderate persistent narrowing of esophagus at cervicothoracic junction. It is uncertain if this is due to external compression or stricture or mass. Endoscopy is recommended as well as potentially CT scan of the chest with intravenous contrast.   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  INSTRUMENTAL SWALLOW STUDY FINDINGS (MBSS) None noted  PATIENT REPORTED OUTCOME MEASURES (PROM): EAT-10: Pt completed 09/18/23 with EAT -10 score of 24/40 with higher scores indicating  pt's swallowing deficits negatively affect QoL.                                                                                                                             TREATMENT DATE:   10/15/23: Pt to see about esophageal dilation 11/29/23. Pt eating cereal bars, chips, soups, cheese, mashed potatoes. Scrambled eggs and corn beef hash with potatoes - pt reports reduced pharyngeal  clearance/residue. SLP suggested smoothies - pt will try these. With POs today, cereal bar and juice - no overt s/sx swallowing difficulty. With whole peanut butter crackers and juice pt had coughing and stated, I won't try that again. That's what I do. If I start coughing like that I just put it up. SLP affirmed pt's decision and educated that was sign of dysphagia, but had him try again with 1/3 of cracker bites and that smaller bites/sips might not bring the same symptoms. First bite was without overt s/sx, but second 1/3 cracker bite resulted in coughing and a strange squeaking sound.  SLP suspects tight UES - possibly some of cracker bolus caught in UES (?).  Pt has not been performing HEP. Today he req'd mod A with both exercises faded to independent. Pt was given rationale for HEP. SLP strongly reiterated pt complete 20-30 reps of each of the exercises/day. He and SLP agreed pt could be seen again in 8 weeks  09/18/23: I experiment with soft things that can go down easy and not choke me. Vienna sausage, oatmeal, soups, ice cream, eggs. Still having 4 tube feedings/day. Sometimes those eggs get a little too hard. I have to cook them softer. Pt coughs eggs up and spits them out if they are too rubbery. SLP suggested scrambled eggs and adding cream or half and half to make them more wet, maybe having gravy with them. SLP also told pt to use gravy and other sauces to make food more slick for easier pharyngeal passage. He ate cereal bar and drank thin liquids today without any oral s/sx difficulty, or overt s/sx pharyngeal deficits. He has not been doing HEP. He req'd consistent mod A with Mendelsohn and effortful, and was independent at task completion.   08/02/24: SLP educated pt on swallow exercises today including effortful swallow, and Mendelsohn maneuver. He req'd mod A faded to min A with both exercises. He was told to complete 20 swallows each day for each of these. SLP also provided information  about aspiration PNA. See pt instructions.  PATIENT EDUCATION: Education details: see treatment date for today's date Person educated: Patient and sister Education method: Explanation, Demonstration, Verbal cues, and Handouts Education comprehension: verbalized understanding, returned demonstration, verbal cues required, and needs further education   ASSESSMENT:  CLINICAL IMPRESSION: Patient is a 68 y.o. M who was seen today for treatment following RT for esophageal cancer. SLP believes that pt's sx do not require MBS at this time due to pt response to coughing sx - he will just put it up. SLP suspects pt's dysphagia sx will improve after esophageal dilation. MBS may be necessary the future. Pt reported a year ago he did not have any s/sx of coughing with POs.  OBJECTIVE IMPAIRMENTS: include dysphagia. These impairments are limiting patient from safety when swallowing. Factors affecting potential to achieve goals and functional outcome are co-morbidities, previous level of function, and severity of impairments. Patient will benefit from skilled SLP services to address above impairments and improve overall function.  REHAB POTENTIAL: Good   GOALS: Goals reviewed with patient? No  SHORT TERM GOALS: Target date:  10/16/23 due to visit number  Pt complete HEP with mod I  Baseline: Goal status: not met  2.  Pt demo knowledge of sx of pharyngeal dysphagia Baseline:  Goal status: not met  3.  Pt demo knowledge of overt s/sx aspiration over two sessions Baseline:  Goal status: partially met  4.  Pt will demo knowledge of how a food journal can assist return to pre-rad tx diet Baseline:  Goal status: not met - pt satisfied with his choices at this time   LONG TERM GOALS: Target date: 11/01/23  Pt will undergo MBS when clinically appropriate Baseline:  Goal status: INITIAL  2.  Pt will complete HEP with mod I over two sessions Baseline:  Goal status: INITIAL  3.  Pt demo  knowledge of overt s/sx aspiration PNA over two sessions Baseline:  Goal status: INITIAL  4.  Pt will improve score on PROM Baseline:  Goal status: INITIAL   PLAN:  SLP FREQUENCY: every  approx 8 weeks  SLP DURATION: 6 sessions  PLANNED INTERVENTIONS: Aspiration precaution training, Pharyngeal strengthening exercises, Diet toleration management , Environmental controls, Trials of upgraded texture/liquids, Cueing hierachy, Internal/external aids, Oral motor exercises, SLP instruction and feedback, Compensatory strategies, Patient/family education, and [id number] Treatment of swallowing function    Melvin Hudson, CCC-SLP 10/16/2023, 8:57 AM

## 2023-10-19 ENCOUNTER — Other Ambulatory Visit: Payer: Self-pay | Admitting: Radiation Oncology

## 2023-10-19 DIAGNOSIS — C153 Malignant neoplasm of upper third of esophagus: Secondary | ICD-10-CM

## 2023-10-20 ENCOUNTER — Encounter: Payer: Self-pay | Admitting: Hematology

## 2023-10-22 ENCOUNTER — Encounter (HOSPITAL_COMMUNITY): Admission: RE | Admit: 2023-10-22 | Source: Ambulatory Visit

## 2023-10-23 ENCOUNTER — Encounter (HOSPITAL_COMMUNITY)
Admission: RE | Admit: 2023-10-23 | Discharge: 2023-10-23 | Disposition: A | Discharge status: Home / Self Care | Source: Ambulatory Visit | Attending: Hematology | Admitting: Hematology

## 2023-10-23 DIAGNOSIS — C090 Malignant neoplasm of tonsillar fossa: Secondary | ICD-10-CM | POA: Diagnosis present

## 2023-10-23 DIAGNOSIS — C153 Malignant neoplasm of upper third of esophagus: Secondary | ICD-10-CM | POA: Diagnosis present

## 2023-10-23 LAB — GLUCOSE, CAPILLARY: Glucose-Capillary: 86 mg/dL (ref 70–99)

## 2023-10-23 MED ORDER — FLUDEOXYGLUCOSE F - 18 (FDG) INJECTION
13.2300 | Freq: Once | INTRAVENOUS | Status: AC | PRN
  Administered 2023-10-23: 13.23 via INTRAVENOUS

## 2023-10-25 NOTE — H&P (Signed)
 HPI:   Melvin Hudson is a 68 y.o. male who presents as a return Patient.   Current problem: Esophageal cancer, dysphagia.  HPI: Completed his treatments about a month ago. He still has a gastrostomy tube. He is able to swallow some liquids and soft things if he chews them very well but he cannot eat anything solid.  PMH/Meds/All/SocHx/FamHx/ROS:   Medical History[1]  Surgical History[2]  No family history of bleeding disorders, wound healing problems or difficulty with anesthesia.     Current Medications[3]   Physical Exam:   Healthy-appearing in no distress. Breathing and voice are clear. Oral cavity and pharynx are healthy and clear. Indirect laryngoscopy reveals mild supraglottic edema but no masses palpable. Airway is clear. No palpable neck masses.  Independent Review of Additional Tests or Records:  none  Procedures:  none  Impression & Plans:  Upper esophageal cancer status posttreatment. Will schedule for esophagoscopy, possible biopsy if there is anything suspicious, and dilatation to see if we can get him swallowing better.

## 2023-10-26 NOTE — Telephone Encounter (Signed)
 Patient called and wanted to know what time his surgery is going to be, and when someone from the hospital would call him. Advised patient it will be this week.

## 2023-10-29 ENCOUNTER — Inpatient Hospital Stay (HOSPITAL_BASED_OUTPATIENT_CLINIC_OR_DEPARTMENT_OTHER): Admitting: Hematology

## 2023-10-29 ENCOUNTER — Inpatient Hospital Stay: Admitting: Dietician

## 2023-10-29 ENCOUNTER — Other Ambulatory Visit: Payer: Self-pay

## 2023-10-29 ENCOUNTER — Inpatient Hospital Stay: Attending: Hematology

## 2023-10-29 ENCOUNTER — Encounter (HOSPITAL_COMMUNITY): Payer: Self-pay | Admitting: Otolaryngology

## 2023-10-29 VITALS — BP 132/90 | HR 72 | Temp 98.5°F | Resp 17 | Ht 74.0 in | Wt 272.5 lb

## 2023-10-29 DIAGNOSIS — Z923 Personal history of irradiation: Secondary | ICD-10-CM | POA: Insufficient documentation

## 2023-10-29 DIAGNOSIS — Z86718 Personal history of other venous thrombosis and embolism: Secondary | ICD-10-CM | POA: Insufficient documentation

## 2023-10-29 DIAGNOSIS — B370 Candidal stomatitis: Secondary | ICD-10-CM | POA: Insufficient documentation

## 2023-10-29 DIAGNOSIS — Z79899 Other long term (current) drug therapy: Secondary | ICD-10-CM | POA: Diagnosis not present

## 2023-10-29 DIAGNOSIS — Z9221 Personal history of antineoplastic chemotherapy: Secondary | ICD-10-CM | POA: Insufficient documentation

## 2023-10-29 DIAGNOSIS — C153 Malignant neoplasm of upper third of esophagus: Secondary | ICD-10-CM

## 2023-10-29 DIAGNOSIS — Z7901 Long term (current) use of anticoagulants: Secondary | ICD-10-CM | POA: Diagnosis not present

## 2023-10-29 LAB — CMP (CANCER CENTER ONLY)
ALT: 14 U/L (ref 0–44)
AST: 16 U/L (ref 15–41)
Albumin: 4 g/dL (ref 3.5–5.0)
Alkaline Phosphatase: 138 U/L — ABNORMAL HIGH (ref 38–126)
Anion gap: 2 — ABNORMAL LOW (ref 5–15)
BUN: 8 mg/dL (ref 8–23)
CO2: 35 mmol/L — ABNORMAL HIGH (ref 22–32)
Calcium: 9 mg/dL (ref 8.9–10.3)
Chloride: 104 mmol/L (ref 98–111)
Creatinine: 0.69 mg/dL (ref 0.61–1.24)
GFR, Estimated: >60 mL/min (ref >60)
Glucose, Bld: 107 mg/dL — ABNORMAL HIGH (ref 70–99)
Potassium: 3.9 mmol/L (ref 3.5–5.1)
Sodium: 141 mmol/L (ref 135–145)
Total Bilirubin: 0.8 mg/dL (ref 0.0–1.2)
Total Protein: 6.9 g/dL (ref 6.5–8.1)

## 2023-10-29 LAB — CBC WITH DIFFERENTIAL (CANCER CENTER ONLY)
Abs Immature Granulocytes: 0.03 K/uL (ref 0.00–0.07)
Basophils Absolute: 0 K/uL (ref 0.0–0.1)
Basophils Relative: 0 %
Eosinophils Absolute: 0.6 K/uL — ABNORMAL HIGH (ref 0.0–0.5)
Eosinophils Relative: 11 %
HCT: 38.3 % — ABNORMAL LOW (ref 39.0–52.0)
Hemoglobin: 13.4 g/dL (ref 13.0–17.0)
Immature Granulocytes: 1 %
Lymphocytes Relative: 14 %
Lymphs Abs: 0.8 K/uL (ref 0.7–4.0)
MCH: 29.6 pg (ref 26.0–34.0)
MCHC: 35 g/dL (ref 30.0–36.0)
MCV: 84.7 fL (ref 80.0–100.0)
Monocytes Absolute: 0.7 K/uL (ref 0.1–1.0)
Monocytes Relative: 13 %
Neutro Abs: 3.5 K/uL (ref 1.7–7.7)
Neutrophils Relative %: 61 %
Platelet Count: 203 K/uL (ref 150–400)
RBC: 4.52 MIL/uL (ref 4.22–5.81)
RDW: 16.4 % — ABNORMAL HIGH (ref 11.5–15.5)
WBC Count: 5.7 K/uL (ref 4.0–10.5)
nRBC: 0 % (ref 0.0–0.2)

## 2023-10-29 MED ORDER — NYSTATIN 100000 UNIT/ML MT SUSP: 10.0000 mL | Freq: Four times a day (QID) | OROMUCOSAL | 0 refills | Status: DC

## 2023-10-29 NOTE — Progress Notes (Signed)
 Nutrition Follow-up:  Patient with esophageal cancer. Patient has completed concurrent chemoradiation therapy with weekly carboplatin  + paclitaxel  Final RT 6/16   EGD 9/12 - to assess for residual cancer  Met with patient in infusion. He reports doing well. Eating more orally. Mostly soft foods (potted meat, beeforoni, soups, fruit bars, eggs). He also likes chips. Patient continues supplementing with TF. Giving one carton Nutren 1.5 in the morning and one in the evening after supper. Patient reports usually eating a bowl of ice cream at bedtime. Also has a few fruit bars while in bed looking at his phone. Patient giving 1 bottle of water  with bolus. Drinking coffee, apple juice, and orange juice.   Patient reports mouth stays dry. He uses biotene lozenges which work well. Patient continues using CPAP overnight despite being told he no longer needed this at last sleep study. Noted recurrent thrush per MD note. He will start nystatin .   Medications: nystatin  (9/11)   Labs: reviewed   Anthropometrics: Wt 272 lb 8 oz today increased (31 lbs since completing treatment)  8/19 - 261.8 lb (home scale - pt reported) 7/15 - 253 lb 12.8 oz 6/11 - 241 lb 6.4 oz   NUTRITION DIAGNOSIS: Food and nutrition related knowledge deficit ongoing    INTERVENTION:  Suggested not having ice cream and cookies at bedtime Given wt gain, recommend daily Nutren 1.5 - Pt to increase if po is poor Continue soft moist foods Continue HEP as prescribed per SLP   MONITORING, EVALUATION, GOAL: wt trends, intake, TF, EGD results   NEXT VISIT: To be determined

## 2023-10-29 NOTE — Progress Notes (Signed)
 SDW call  Patient was given pre-op instructions over the phone. Patient verbalized understanding of instructions provided.     PCP - Dr. Rayna Hemming Cardiologist - Dr. Madonna Large Oncology Dr. Onita Mattock Pulmonary:    PPM/ICD - denies Device Orders - na Rep Notified - na   Chest x-ray - 08/14/2023 EKG -  06/29/2023 Stress Test -06/28/2018 ECHO - 03/28/2022 Cardiac Cath - 12/31/2010  Sleep Study/sleep apnea/CPAP: OSA with nightly CPaP  Type II diabetic. Las A1C 4.6 on 06/12/23. Fasting Blood sugar range: 90-150 How often check sugars: twice a day Faraxiga, states last dose 10/29/2023   Blood Thinner Instructions: Eliquis , states last doe morning of 10/29/2023, OK to hold per Dr. Jesus and Dr. Mattock  Aspirin  Instructions: denies   ERAS Protcol - NPO  Anesthesia review: Yes. CHF, HTN, DM, HLD, OSA with CPAP, G-tube   Patient denies shortness of breath, fever, cough and chest pain over the phone call  Your procedure is scheduled on Friday October 30, 2023  Report to [facility] Main Entrance A at  0630  A.M., then check in with the Admitting office.  Call this number if you have problems the morning of surgery:  [phone]   If you have any questions prior to your surgery date call [phone]: Open Monday-Friday 8am-4pm If you experience any cold or flu symptoms such as cough, fever, chills, shortness of breath, etc. between now and your scheduled surgery, please notify us  at the above number     Remember:  Do not eat or drink after midnight the night before your surgery              Instructed to turn off tube feeding at midnight.  Take these medicines the morning of surgery with A SIP OF WATER :  Allopurinol , carvedilol , klonopin , gabapentin , isosorbide -hydralazine   As needed: Albutero, hycet,  As of today, STOP taking any Aspirin  (unless otherwise instructed by your surgeon) Aleve, Naproxen, Ibuprofen, Motrin, Advil, Goody's, BC's, all herbal medications, fish  oil, and all vitamins.

## 2023-10-29 NOTE — Progress Notes (Addendum)
 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 He is s/p esophagoscopy with biopsy with Dr. Jesus on 05/27/2023. 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     History includes former smoker (quit 06/17/2013), HTN, DM2, CHF, hypercholesterolemia, angioedema (2013), OSA (uses CPAP), right tonsillar cancer (s/p radiation 07/26/2013 - 09/15/2013), esophageal cancer (05/27/2023; s/p chemoRT 06/24/2023-08/03/2023), PICC line (RUE 06/30/2023), G-tube (06/12/2023, reposition 08/14/2023), DVT (LLE 06/16/2023).  He was diagnosed with esophageal cancer in April. At initial oncology evaluation, he was sent for 06/11/2023 admission for urgent PEG tube placement and tube feed initiation due to dysphagia. S/p G-tube 06/12/2023. Of note, he complained of LLE pain and venous US  on 06/16/2023 was positive for FVT, so he was started on Eliquis  which is managed by Dr. Lanny. He was discharged home on 06/17/2023. He also also sent to ED from oncology office on 06/29/2023 for chest pain, suspected related to his esophageal cancer. Troponins negative x2, and ED provider also suspected non-cardiac chest pain.    Last evaluated by pulmonology on 04/30/2023 by Madelin Stank, NP for severe OSA follow-up (AHI 43 on 12/01/11). He had good compliance with CPAP. She noted pending ENT procedure for dysphagia. One year follow-up planned.   Last cardiology evaluation was on 04/20/2023 with Dr. Michele  for HFpEF follow-up. Euvolemic at that time. Some adjustments made in GDMT due to soft BP readiings. No new testing ordered.  72-month follow-up planned.  He reported last Farxiga  10/29/2023. Last A1c 4.6% on 06/12/2023.    He had labs and HEM follow-up with Dr. Lanny on 10/29/2023. Note is still pending. She is the one who referred him back to Dr. Jesus for repeat esophagoscopy, possible biopsies, and for dilation to see if it would help with his dysphagia. Dr. Jesus reached out to Dr. Lanny regarding preoperative Eliquis  instructions. She felt it was okay to temporarily hold. He reported last dose 10/29/2023 AM.  Anesthesia team to evaluate on the day of surgery.       VS: Ht 6' 2 (1.88 m)   Wt 120.2 kg   BMI 34.02 kg/m  BP Readings from Last 3 Encounters:  10/29/23 (!) 132/90  09/17/23 124/80  09/01/23 (!) 151/98   Pulse Readings from Last 3 Encounters:  10/29/23 72  09/17/23 81  09/01/23 77    PROVIDERS: Haze Kingfisher, MD is PCP Michele Richardson, DO is cardiologist Stank Madelin, NP is pulmonology provider Dianna Specking, MD is GI [name], MD is RAD-ONC   LABS: He had CBC and CMP at [facility] on 10/29/2023. Most recent results include: Lab Results  Component Value Date   WBC 5.7 10/29/2023   HGB 13.4 10/29/2023   HCT 38.3 (L) 10/29/2023   PLT 203 10/29/2023   GLUCOSE 107 (H) 10/29/2023   CHOL 85 (L) 04/27/2023   TRIG 64 04/27/2023  HDL 33 (L) 04/27/2023   LDLDIRECT 39 04/27/2023   LDLCALC 37 04/27/2023   ALT 14 10/29/2023   AST 16 10/29/2023   NA 141 10/29/2023   K 3.9 10/29/2023   CL 104 10/29/2023   CREATININE 0.69 10/29/2023   BUN 8 10/29/2023   CO2 35 (H) 10/29/2023   TSH 0.992 07/01/2023   INR 1.2 06/12/2023   HGBA1C 4.6 (L) 06/12/2023    IMAGES: PET Scan 10/23/2023: IMPRESSION: 1. Response to therapy of upper thoracic esophageal primary and mediastinal nodal metastasis. 2. Colonic diverticulosis with possible pericolonic edema adjacent to  the sigmoid. Correlate with symptoms of diverticulitis. 3. Incidental findings, including: Aortic atherosclerosis (icd10-i70.0). Coronary artery atherosclerosis. Pelvic Paget's Disease, as before.  CXR 08/14/2023: FINDINGS: The heart size and mediastinal contours are within normal limits. Right-sided PICC line is noted with distal tip in expected position of the SVC. Both lungs are clear. The visualized skeletal structures are unremarkable.  IMPRESSION: No active cardiopulmonary disease.  CT Chest 04/28/2023: IMPRESSION: 1. Marked esophageal wall thickening of the included cervical and upper thoracic esophagus, extending from thoracic inlet to the T5 level. Recommend endoscopy for further assessment. If there is history of radiation this may represent post radiation change, although neoplasm could have a similar appearance. 2. Ascending aorta is upper normal at 4 cm. Junction of the transverse and descending aorta is dilated at 4.1 cm. Recommend annual imaging followup by CTA or MRA. This recommendation follows 2010 ACCF/AHA/AATS/ACR/ASA/SCA/SCAI/SIR/STS/SVM Guidelines for the Diagnosis and Management of Patients with Thoracic Aortic Disease. Circulation. 2010; 121: Z733-z630. Aortic aneurysm NOS (ICD10-I71.9) 3. Stable right upper lobe pulmonary nodule, unchanged from 2021 exam considered benign.  - Aortic Atherosclerosis (ICD10-I70.0) and Emphysema (ICD10-J43.9).     EKG:  EKG 06/29/2023: Normal sinus rhythm Right bundle branch block Left anterior fascicular block Bifascicular block Confirmed by Freddi Hamilton [phone]) on 06/29/2023 5:03:20 PM  EKG 04/20/2023: Sinus rhythm with 1st degree A-V block Premature ventricular complexes Right bundle branch block When compared with ECG of 16-Apr-2022 14:18, Nonspecific T wave abnormality has replaced inverted T waves in Inferior leads Confirmed by Michele Richardson [phone]) on 04/20/2023 9:27:09 AM - LAD present.    CV: LLE Venous US   06/16/2023: Summary:  RIGHT:  - No evidence of common femoral vein obstruction.  LEFT:  - Findings consistent with acute deep vein thrombosis involving the left  femoral vein, left popliteal vein, left posterior tibial veins, left  peroneal veins, and left soleal veins.   Echocardiogram 03/28/2022: Left ventricle cavity is normal in size. Mild eccentric hypertrophy of the left ventricle. Normal global wall motion. Normal LV systolic function with visual EF 50-55%. Doppler evidence of grade I (impaired) diastolic dysfunction, normal LAP. The aortic root is mildly dilated at 3.7 cm. Mildly dilated ascending aorta at 4.0 cm. Ascending aorta was not well visualized on previous study in 2020. LA was reported mildly dilated then.    Lexiscan  Myoview  stress test 06/28/2018:  Low risk study   Coronary Angiogram 12/31/2010: Normal LVEF. Slow flow in coronary arteries without stenosis. Mild pulmonary HTN.      Past Medical History:  Diagnosis Date   Angioedema 09/17/2011   tongue and lips   Arthritis    both of my legs and feet (01/05/2014)   CHF (congestive heart failure) (HCC)    Diabetes mellitus without complication (HCC)    Erectile dysfunction 04/20/2018   GI bleed    Heart murmur 04/20/2018   History of radiation therapy    06/24/2023-  08/03/2023 Dr. 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    Past Surgical History:  Procedure Laterality Date   COLONOSCOPY Left 04/11/2014   Procedure: COLONOSCOPY;  Surgeon: Elsie Cree, MD;  Location: [facility] ENDOSCOPY;  Service: Endoscopy;  Laterality: Left;   COLONOSCOPY N/A 04/20/2014   Procedure: COLONOSCOPY;  Surgeon: Jerrell KYM Sol, MD;  Location: [facility] ENDOSCOPY;  Service: Endoscopy;  Laterality: N/A;   COLONOSCOPY W/  BIOPSIES AND POLYPECTOMY     benign   DIRECT LARYNGOSCOPY  01/05/2014   DIRECT LARYNGOSCOPY N/A 01/05/2014   Procedure: DIRECT LARYNGOSCOPY AND BIOPSY;  Surgeon: Ida Loader, MD;  Location: [facility] OR;  Service: ENT;  Laterality: N/A;   ESOPHAGOSCOPY WITH DILITATION N/A 05/27/2023   Procedure: ESOPHAGOSCOPY;  Surgeon: Loader Ida, MD;  Location: [facility] OR;  Service: ENT;  Laterality: N/A;   IR GASTROSTOMY TUBE MOD SED  06/12/2023   IR RADIOLOGIST EVAL & MGMT  08/10/2023   IR RADIOLOGIST EVAL & MGMT  08/14/2023   KNEE ARTHROSCOPY Right ~ 1994   LEFT AND RIGHT HEART CATHETERIZATION WITH CORONARY/GRAFT ANGIOGRAM N/A 12/31/2010   Procedure: LEFT AND RIGHT HEART CATHETERIZATION WITH EL BILE;  Surgeon: Erick JONELLE Bergamo, MD;  Location[facility] CATH LAB;  Service: Cardiovascular;  Laterality: N/A;   MULTIPLE EXTRACTIONS WITH ALVEOLOPLASTY N/A 07/08/2013   Procedure: Extraction of tooth #'s 22, 27 with alveoloplasty and bilateal mandibular facial exostoses reductions;  Surgeon: Tanda JULIANNA Fanny, DDS;  Location: [facility] ORS;  Service: Oral Surgery;  Laterality: N/A;   MULTIPLE TOOTH EXTRACTIONS  ~ 2008    MEDICATIONS: No current facility-administered medications for this encounter.    dapagliflozin  propanediol (FARXIGA ) 10 MG TABS tablet   albuterol  (VENTOLIN  HFA) 108 (90 Base) MCG/ACT inhaler   allopurinol  (ZYLOPRIM ) 300 MG tablet   APIXABAN  (ELIQUIS ) VTE STARTER PACK (10MG  AND 5MG )   atorvastatin  (LIPITOR) 40 MG tablet   bismuth subsalicylate (PEPTO BISMOL) 262 MG/15ML suspension   Blood Glucose Monitoring Suppl (ONETOUCH VERIO FLEX SYSTEM) w/Device KIT   calcium  carbonate (TUMS - DOSED IN MG ELEMENTAL CALCIUM ) 500 MG chewable tablet   carvedilol  (COREG ) 3.125 MG tablet   clonazePAM  (KLONOPIN ) 1 MG disintegrating tablet   diphenhydrAMINE  (BENADRYL ) 25 MG tablet   ELIQUIS  5 MG TABS tablet   famotidine  (PEPCID ) 40 MG/5ML suspension   gabapentin  (NEURONTIN ) 300 MG capsule    HYDROcodone -acetaminophen  (HYCET) 7.5-325 mg/15 ml solution   hydrocortisone  (ANUSOL -HC) 2.5 % rectal cream   isosorbide -hydrALAZINE  (BIDIL ) 20-37.5 MG tablet   lactose free nutrition (BOOST) LIQD   lidocaine  (XYLOCAINE ) 2 % solution   LORazepam  (ATIVAN ) 0.5 MG tablet   nystatin  (MYCOSTATIN ) 100000 UNIT/ML suspension   nystatin  (MYCOSTATIN ) 100000 UNIT/ML suspension   omeprazole  (PRILOSEC) 20 MG capsule   ondansetron  (ZOFRAN ) 8 MG tablet   ONETOUCH VERIO test strip   polyethylene glycol (MIRALAX  / GLYCOLAX ) 17 g packet   prochlorperazine  (COMPAZINE ) 10 MG tablet   Protein (FEEDING SUPPLEMENT, PROSOURCE TF20,) liquid   psyllium (HYDROCIL/METAMUCIL) 95 % PACK   senna-docusate (SENOKOT-S) 8.6-50 MG tablet   Water  For Irrigation, Sterile (FREE WATER ) SOLN    Isaiah Ruder, PA-C Surgical Short Stay/Anesthesiology [facility] Phone [phone] [facility] Phone [phone] 10/29/2023 10:52 AM

## 2023-10-29 NOTE — Progress Notes (Addendum)
 [facility]   [phone] [phone]   Clinic Follow up Note   Patient Care Team: Haze Kingfisher, MD as PCP - General (Family Medicine) Michele Richardson, DO as PCP - Cardiology (Cardiology) Clance, Francis HERO, MD as Referring Physician (Pulmonary Disease) Lonn Hicks, MD as Consulting Physician (Hematology and Oncology) [name], MD as Attending Physician (Radiation Oncology) [name], MD as Consulting Physician (Hematology and Oncology)  Date of Service:  10/29/2023  CHIEF COMPLAINT: f/u of esophageal cancer  CURRENT THERAPY:  Observation  Oncology History   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 -EGD on 05/27/2023 which showed a mass in the proximal esophagus, 2 to 3 cm below introitus.  Biopsy confirmed invasive squamous cell carcinoma.  -PET scan showed hypermetabolic lesion in the proximal esophagus, with a positive peritracheal lymph node.  No distant metastasis -Status post PEG feeding tube placement  -He started chemoRT on 06/24/2023 and completed on 08/03/23 -ED visit for chest pain on 06/29/2023  Assessment & Plan Esophageal cancer post-radiation under surveillance Good response to treatment with PET scan showing reduction in size, though PET scan may show inflammation from radiation. Endoscopy scheduled to assess for residual cancer. Willing to undergo further treatment if necessary. - Perform endoscopy to assess for residual cancer - Consider chemotherapy and immunotherapy if residual cancer is present - Repeat PET scan in 3 to 6 months if endoscopy shows residual cancer - Discuss potential placement of a port for chemotherapy administration if needed  Feeding tube dependence Dependent on feeding tube due to dysphagia. Able to consume liquids but not solids. - Continue nutritional support via feeding tube - Monitor  nutritional intake and swallowing ability  Pain at feeding tube site Mild pain at feeding tube site. Site is dry and clean with daily dressing changes. - Continue daily dressing changes at feeding tube site  Oropharyngeal candidiasis (oral thrush) Presence of white coating on tongue without pain or discomfort. Using mouthwash for management. - Refill prescription for mouthwash to manage oral thrush  Plan -PET scan reviewed, partial response, he likely still has residual disease.  He is scheduled by ENT Dr. Jesus for repeated endoscopy tomorrow - If endoscopy confirms residual disease, I recommend additional chemotherapy FOLFOX for 3 months and immunotherapy, will request IR for port placement  - Phone visit in 2 weeks to finalize the plan.    SUMMARY OF ONCOLOGIC HISTORY: Oncology History  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HCC)  06/14/2013 Procedure   Biopsy of the tonsil confirmrf squamous cell carcinoma. 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  History of Present Illness Melvin Hudson is a 68 year old male with esophageal cancer who presents for follow-up.  A PET scan on  October 23, 2023, shows a reduction in tumor size compared to April 2025. He is unable to eat solid foods and primarily consumes soups, necessitating the use of a feeding tube for nutrition. He experiences some pain at the feeding tube site but manages it with regular care. He takes Eliquis , with the last dose taken this morning.     All other systems were reviewed with the patient and are negative.  MEDICAL HISTORY:  Past Medical History:  Diagnosis Date   Angioedema 09/17/2011   tongue and lips   Arthritis    both of my legs and feet (01/05/2014)   CHF (congestive heart failure) (HCC)    Diabetes mellitus without complication (HCC)    DVT (deep venous thrombosis) (HCC) 06/16/2023   LLE   Erectile dysfunction 04/20/2018   GI bleed    Heart murmur 04/20/2018   History of radiation therapy    06/24/2023- 08/03/2023 Dr. 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    SURGICAL HISTORY: Past Surgical History:  Procedure Laterality Date   COLONOSCOPY Left 04/11/2014   Procedure: COLONOSCOPY;  Surgeon: Elsie Cree, MD;  Location: [facility] ENDOSCOPY;  Service: Endoscopy;  Laterality: Left;   COLONOSCOPY N/A 04/20/2014   Procedure: COLONOSCOPY;  Surgeon: Jerrell KYM Sol, MD;  Location: [facility] ENDOSCOPY;  Service: Endoscopy;  Laterality: N/A;   COLONOSCOPY W/ BIOPSIES AND POLYPECTOMY     benign   DIRECT LARYNGOSCOPY  01/05/2014   DIRECT LARYNGOSCOPY N/A 01/05/2014   Procedure: DIRECT LARYNGOSCOPY AND BIOPSY;  Surgeon: Ida Loader, MD;  Location: [facility] OR;  Service: ENT;  Laterality: N/A;   ESOPHAGOSCOPY WITH DILITATION N/A 05/27/2023   Procedure: ESOPHAGOSCOPY;  Surgeon: Loader Ida, MD;  Location: [facility] OR;  Service: ENT;  Laterality: N/A;   IR GASTROSTOMY TUBE MOD SED  06/12/2023    IR RADIOLOGIST EVAL & MGMT  08/10/2023   IR RADIOLOGIST EVAL & MGMT  08/14/2023   KNEE ARTHROSCOPY Right ~ 1994   LEFT AND RIGHT HEART CATHETERIZATION WITH CORONARY/GRAFT ANGIOGRAM N/A 12/31/2010   Procedure: LEFT AND RIGHT HEART CATHETERIZATION WITH EL BILE;  Surgeon: Erick JONELLE Bergamo, MD;  Location: [facility] CATH LAB;  Service: Cardiovascular;  Laterality: N/A;   MULTIPLE EXTRACTIONS WITH ALVEOLOPLASTY N/A 07/08/2013   Procedure: Extraction of tooth #'s 22, 27 with alveoloplasty and bilateal mandibular facial exostoses reductions;  Surgeon: Tanda JULIANNA Fanny, DDS;  Location: [facility] ORS;  Service: Oral Surgery;  Laterality: N/A;   MULTIPLE TOOTH EXTRACTIONS  ~ 2008    I have reviewed the social history and family history with the patient and they are unchanged from previous note.  ALLERGIES:  is allergic to azilsartan and advil [ibuprofen].  MEDICATIONS:  Current Outpatient Medications  Medication Sig Dispense Refill   albuterol  (VENTOLIN  HFA) 108 (90 Base) MCG/ACT inhaler Inhale 2 puffs into the lungs every 4 (four) hours as needed for wheezing (Cough).     allopurinol  (ZYLOPRIM ) 300 MG tablet Take 300 mg by mouth every other day.     APIXABAN  (ELIQUIS ) VTE STARTER PACK (10MG  AND 5MG ) Take as directed on package: start  with two-5mg  tablets twice daily for 7 days. On day 8, switch to one-5mg  tablet twice daily. 74 each 0   atorvastatin  (LIPITOR) 40 MG tablet Take 1 tablet (40 mg total) by mouth at bedtime. 90 tablet 1   bismuth subsalicylate (PEPTO BISMOL) 262 MG/15ML suspension Take 30 mLs by mouth every 6 (six) hours as needed for indigestion or diarrhea or loose stools.     Blood Glucose Monitoring Suppl (ONETOUCH VERIO FLEX SYSTEM) w/Device KIT USE TO CHECK BLOOD SUGAR TWICE DAILY     calcium  carbonate (TUMS - DOSED IN MG ELEMENTAL CALCIUM ) 500 MG chewable tablet Chew 2 tablets (400 mg of elemental calcium  total) by mouth 3 (three) times daily with meals. 180 tablet 1   carvedilol   (COREG ) 3.125 MG tablet Take 3.125 mg by mouth 2 (two) times daily with a meal.     clonazePAM  (KLONOPIN ) 1 MG disintegrating tablet Take 1 mg by mouth daily.     dapagliflozin  propanediol (FARXIGA ) 10 MG TABS tablet Take 10 mg by mouth daily.     diphenhydrAMINE  (BENADRYL ) 25 MG tablet Take 1 tablet (25 mg total) by mouth every 6 (six) hours. 20 tablet 0   ELIQUIS  5 MG TABS tablet TAKE 1 TABLET(5 MG) BY MOUTH TWICE DAILY 60 tablet 1   famotidine  (PEPCID ) 40 MG/5ML suspension PLACE 2.5 MILLILITERS INTO THE FEEDING TUBE DAILY 50 mL 1   gabapentin  (NEURONTIN ) 300 MG capsule Take 300 mg by mouth 3 (three) times daily.   2   HYDROcodone -acetaminophen  (HYCET) 7.5-325 mg/15 ml solution Take 15 mLs by mouth 4 (four) times daily as needed for moderate pain (pain score 4-6). 473 mL 0   hydrocortisone  (ANUSOL -HC) 2.5 % rectal cream Place 1 Application rectally daily as needed for hemorrhoids or anal itching.     isosorbide -hydrALAZINE  (BIDIL ) 20-37.5 MG tablet Take 1 tablet by mouth in the morning and at bedtime. 90 tablet 3   lactose free nutrition (BOOST) LIQD Take 237 mLs by mouth daily. (Patient not taking: Reported on 09/01/2023)     lidocaine  (XYLOCAINE ) 2 % solution MIX 1 PART(5ML) OF LIDOCAINE  AND 1 PART WATER ( ). SWALLOW OF DILUTED MIXTURE 30 MINUTES BEFORE MEALS AND BEDTIME FOR UP TO FOUR TIMES DAILY 200 mL 3   LORazepam  (ATIVAN ) 0.5 MG tablet Take 1 tablet PRN claustrophobia 20 minutes prior to radiation. Will need a driver. Use only as needed. (Patient not taking: Reported on 09/01/2023) 5 tablet 0   nystatin  (MYCOSTATIN ) 100000 UNIT/ML suspension SHAKE LIQUID AND TAKE 10 ML BY MOUTH FOUR TIMES DAILY FOR THRUSH. STOP WHEN THRUSH RESOLVES 473 mL 2   nystatin  (MYCOSTATIN ) 100000 UNIT/ML suspension Take 10 mLs (1,000,000 Units total) by mouth 4 (four) times daily. For oral thrush. Stop when thrush resolves 473 mL 0   omeprazole  (PRILOSEC) 20 MG capsule Take 20 mg by mouth daily.  (Patient not  taking: Reported on 10/29/2023)  4   ondansetron  (ZOFRAN ) 8 MG tablet Take 1 tablet (8 mg total) by mouth every 8 (eight) hours as needed for nausea or vomiting. Start on the third day after chemotherapy. (Patient not taking: Reported on 09/01/2023) 30 tablet 1   ONETOUCH VERIO test strip USE TO CHECK BLOOD SUGAR TWICE DAILY     polyethylene glycol (MIRALAX  / GLYCOLAX ) 17 g packet Take 17 g by mouth daily. 14 each 0   prochlorperazine  (COMPAZINE ) 10 MG tablet Take 1 tablet (10 mg total) by mouth every 6 (six) hours as needed for nausea or vomiting. (Patient not  taking: Reported on 10/29/2023) 30 tablet 1   Protein (FEEDING SUPPLEMENT, PROSOURCE TF20,) liquid Place 60 mLs into feeding tube 2 (two) times daily.     psyllium (HYDROCIL/METAMUCIL) 95 % PACK Take 1 packet by mouth daily. 240 each 0   senna-docusate (SENOKOT-S) 8.6-50 MG tablet Take 1 tablet by mouth 2 (two) times daily. (Patient not taking: Reported on 10/29/2023) 60 tablet 1   Water  For Irrigation, Sterile (FREE WATER ) SOLN Place 100 mLs into feeding tube every 6 (six) hours.     No current facility-administered medications for this visit.    PHYSICAL EXAMINATION: ECOG PERFORMANCE STATUS: 1 - Symptomatic but completely ambulatory  Vitals:   10/29/23 0903 10/29/23 0905  BP: (!) 150/92 (!) 132/90  Pulse: 72   Resp: 17   Temp: 98.5 F (36.9 C)   SpO2: 98%    Wt Readings from Last 3 Encounters:  10/29/23 272 lb 8 oz (123.6 kg)  09/17/23 260 lb 1.6 oz (118 kg)  09/01/23 253 lb 12.8 oz (115.1 kg)     GENERAL:alert, no distress and comfortable SKIN: skin color, texture, turgor are normal, no rashes or significant lesions EYES: normal, Conjunctiva are pink and non-injected, sclera clear Musculoskeletal:no cyanosis of digits and no clubbing  NEURO: alert & oriented x 3 with fluent speech, no focal motor/sensory deficits ABDOMEN: Feeding tube site dry and clean Physical Exam   LABORATORY DATA:  I have reviewed the data as  listed    Latest Ref Rng & Units 10/29/2023    8:36 AM 09/17/2023    9:56 AM 08/20/2023   12:18 PM  CBC  WBC 4.0 - 10.5 K/uL 5.7  4.9  4.2   Hemoglobin 13.0 - 17.0 g/dL 86.5  87.8  88.6   Hematocrit 39.0 - 52.0 % 38.3  35.7  33.6   Platelets 150 - 400 K/uL 203  164  259         Latest Ref Rng & Units 10/29/2023    8:36 AM 09/17/2023    9:56 AM 08/20/2023   12:18 PM  CMP  Glucose 70 - 99 mg/dL 892  83  866   BUN 8 - 23 mg/dL 8  9  12    Creatinine 0.61 - 1.24 mg/dL 9.30  9.31  9.28   Sodium 135 - 145 mmol/L 141  141  140   Potassium 3.5 - 5.1 mmol/L 3.9  4.1  4.0   Chloride 98 - 111 mmol/L 104  106  105   CO2 22 - 32 mmol/L 35  32  32   Calcium  8.9 - 10.3 mg/dL 9.0  9.0  9.1   Total Protein 6.5 - 8.1 g/dL 6.9  6.5  6.5   Total Bilirubin 0.0 - 1.2 mg/dL 0.8  0.8  0.8   Alkaline Phos 38 - 126 U/L 138  110  106   AST 15 - 41 U/L 16  17  17    ALT 0 - 44 U/L 14  12  12        RADIOGRAPHIC STUDIES: I have personally reviewed the radiological images as listed and agreed with the findings in the report. No results found.    No orders of the defined types were placed in this encounter.  All questions were answered. The patient knows to call the clinic with any problems, questions or concerns. No barriers to learning was detected. The total time spent in the appointment was 25 minutes, including review of chart and various tests results, discussions about  plan of care and coordination of care plan     Onita Mattock, MD 10/29/2023

## 2023-10-29 NOTE — Anesthesia Preprocedure Evaluation (Addendum)
 Anesthesia Evaluation  Patient identified by MRN, date of birth, ID band Patient awake    Reviewed: Allergy & Precautions, NPO status , Patient's Chart, lab work & pertinent test results  History of Anesthesia Complications Negative for: history of anesthetic complications  Airway Mallampati: II  TM Distance: >3 FB Neck ROM: Limited   Comment: Previous grade I view with Glidescope 4 Dental  (+) Edentulous Upper, Edentulous Lower   Pulmonary neg shortness of breath, sleep apnea and Continuous Positive Airway Pressure Ventilation , neg COPD, neg recent URI, former smoker   Pulmonary exam normal breath sounds clear to auscultation       Cardiovascular hypertension (carvedilol , isosorbide -hydralazine ), Pt. on home beta blockers and Pt. on medications (-) angina +CHF and + DVT (LLE 05/2023)  + dysrhythmias (RBBB) + Valvular Problems/Murmurs  Rhythm:Regular Rate:Normal  HLD  Echocardiogram 03/28/2022:  Left ventricle cavity is normal in size. Mild eccentric hypertrophy of the  left ventricle. Normal global wall motion. Normal LV systolic function  with visual EF 50-55%. Doppler evidence of grade I (impaired) diastolic  dysfunction, normal LAP.  The aortic root is mildly dilated at 3.7 cm. Mildly dilated ascending  aorta at 4.0 cm.  Ascending aorta was not well visualized on previous study in 2020. LA was  reported mildly dilated then.     Neuro/Psych neg Seizures PSYCHIATRIC DISORDERS Anxiety     Chronic pain syndrome    GI/Hepatic Neg liver ROS,GERD  Medicated,,Esophageal cancer, G-tube   Endo/Other  diabetes, Type 2    Renal/GU negative Renal ROS     Musculoskeletal  (+) Arthritis ,    Abdominal  (+) + obese  Peds  Hematology negative hematology ROS (+) Lab Results      Component                Value               Date                      WBC                      5.7                 10/29/2023                HGB                       13.4                10/29/2023                HCT                      38.3 (L)            10/29/2023                MCV                      84.7                10/29/2023                PLT                      203  10/29/2023              Anesthesia Other Findings H/o right tonsil/bilateral neck radiation for cancer  Last Farxiga : yesterday  Reproductive/Obstetrics                              Anesthesia Physical Anesthesia Plan  ASA: 3  Anesthesia Plan: General   Post-op Pain Management:    Induction: Intravenous  PONV Risk Score and Plan: 2 and Ondansetron , Dexamethasone  and Treatment may vary due to age or medical condition  Airway Management Planned: Oral ETT and Video Laryngoscope Planned  Additional Equipment:   Intra-op Plan:   Post-operative Plan: Extubation in OR  Informed Consent: I have reviewed the patients History and Physical, chart, labs and discussed the procedure including the risks, benefits and alternatives for the proposed anesthesia with the patient or authorized representative who has indicated his/her understanding and acceptance.     Dental advisory given  Plan Discussed with: CRNA and Anesthesiologist  Anesthesia Plan Comments: (PAT note written 10/29/2023 by Allison Zelenak, PA-C.  Risks of general anesthesia discussed including, but not limited to, sore throat, hoarse voice, chipped/damaged teeth, injury to vocal cords, nausea and vomiting, allergic reactions, lung infection, heart attack, stroke, and death. All questions answered.  )         Anesthesia Quick Evaluation

## 2023-10-29 NOTE — Assessment & Plan Note (Signed)
-  cTxN1M0, invasive squamous cell carcinoma -Present with progressive dysphagia since December 2024. -EGD on 05/27/2023 which showed a mass in the proximal esophagus, 2 to 3 cm below introitus.  Biopsy confirmed invasive squamous cell carcinoma.  -PET scan showed hypermetabolic lesion in the proximal esophagus, with a positive peritracheal lymph node.  No distant metastasis -Status post PEG feeding tube placement  -He started chemoRT on 06/24/2023 and completed on 08/03/23 -ED visit for chest pain on 06/29/2023

## 2023-10-30 ENCOUNTER — Other Ambulatory Visit: Payer: Self-pay

## 2023-10-30 ENCOUNTER — Encounter (HOSPITAL_COMMUNITY): Payer: Self-pay | Admitting: Otolaryngology

## 2023-10-30 ENCOUNTER — Ambulatory Visit (HOSPITAL_COMMUNITY): Admitting: Vascular Surgery

## 2023-10-30 ENCOUNTER — Encounter (HOSPITAL_COMMUNITY)
Admission: RE | Disposition: A | Discharge status: Home / Self Care | Payer: Self-pay | Source: Home / Self Care | Attending: Otolaryngology

## 2023-10-30 ENCOUNTER — Ambulatory Visit (HOSPITAL_COMMUNITY)
Admission: RE | Admit: 2023-10-30 | Discharge: 2023-10-30 | Disposition: A | Discharge status: Home / Self Care | Attending: Otolaryngology | Admitting: Otolaryngology

## 2023-10-30 ENCOUNTER — Ambulatory Visit: Attending: Radiation Oncology

## 2023-10-30 ENCOUNTER — Ambulatory Visit: Admitting: Adult Health

## 2023-10-30 DIAGNOSIS — Z8501 Personal history of malignant neoplasm of esophagus: Secondary | ICD-10-CM | POA: Insufficient documentation

## 2023-10-30 DIAGNOSIS — K209 Esophagitis, unspecified without bleeding: Secondary | ICD-10-CM | POA: Insufficient documentation

## 2023-10-30 DIAGNOSIS — M1990 Unspecified osteoarthritis, unspecified site: Secondary | ICD-10-CM | POA: Diagnosis not present

## 2023-10-30 DIAGNOSIS — K219 Gastro-esophageal reflux disease without esophagitis: Secondary | ICD-10-CM | POA: Diagnosis not present

## 2023-10-30 DIAGNOSIS — G4733 Obstructive sleep apnea (adult) (pediatric): Secondary | ICD-10-CM | POA: Insufficient documentation

## 2023-10-30 DIAGNOSIS — Z9221 Personal history of antineoplastic chemotherapy: Secondary | ICD-10-CM | POA: Diagnosis not present

## 2023-10-30 DIAGNOSIS — I110 Hypertensive heart disease with heart failure: Secondary | ICD-10-CM

## 2023-10-30 DIAGNOSIS — E119 Type 2 diabetes mellitus without complications: Secondary | ICD-10-CM | POA: Diagnosis not present

## 2023-10-30 DIAGNOSIS — I5032 Chronic diastolic (congestive) heart failure: Secondary | ICD-10-CM | POA: Insufficient documentation

## 2023-10-30 DIAGNOSIS — Z7984 Long term (current) use of oral hypoglycemic drugs: Secondary | ICD-10-CM | POA: Diagnosis not present

## 2023-10-30 DIAGNOSIS — Z79899 Other long term (current) drug therapy: Secondary | ICD-10-CM | POA: Insufficient documentation

## 2023-10-30 DIAGNOSIS — E7800 Pure hypercholesterolemia, unspecified: Secondary | ICD-10-CM | POA: Diagnosis not present

## 2023-10-30 DIAGNOSIS — I4510 Unspecified right bundle-branch block: Secondary | ICD-10-CM | POA: Diagnosis not present

## 2023-10-30 DIAGNOSIS — Z87891 Personal history of nicotine dependence: Secondary | ICD-10-CM | POA: Diagnosis not present

## 2023-10-30 DIAGNOSIS — Z7901 Long term (current) use of anticoagulants: Secondary | ICD-10-CM | POA: Insufficient documentation

## 2023-10-30 DIAGNOSIS — F419 Anxiety disorder, unspecified: Secondary | ICD-10-CM | POA: Insufficient documentation

## 2023-10-30 DIAGNOSIS — Z85819 Personal history of malignant neoplasm of unspecified site of lip, oral cavity, and pharynx: Secondary | ICD-10-CM | POA: Diagnosis not present

## 2023-10-30 DIAGNOSIS — Z923 Personal history of irradiation: Secondary | ICD-10-CM | POA: Insufficient documentation

## 2023-10-30 DIAGNOSIS — Z86718 Personal history of other venous thrombosis and embolism: Secondary | ICD-10-CM | POA: Insufficient documentation

## 2023-10-30 DIAGNOSIS — R1310 Dysphagia, unspecified: Secondary | ICD-10-CM | POA: Insufficient documentation

## 2023-10-30 DIAGNOSIS — G894 Chronic pain syndrome: Secondary | ICD-10-CM | POA: Diagnosis not present

## 2023-10-30 DIAGNOSIS — K2289 Other specified disease of esophagus: Secondary | ICD-10-CM | POA: Insufficient documentation

## 2023-10-30 DIAGNOSIS — C153 Malignant neoplasm of upper third of esophagus: Secondary | ICD-10-CM | POA: Diagnosis not present

## 2023-10-30 DIAGNOSIS — R1312 Dysphagia, oropharyngeal phase: Secondary | ICD-10-CM | POA: Insufficient documentation

## 2023-10-30 DIAGNOSIS — Z931 Gastrostomy status: Secondary | ICD-10-CM | POA: Insufficient documentation

## 2023-10-30 HISTORY — PX: ESOPHAGOSCOPY WITH DILITATION: SHX5618

## 2023-10-30 LAB — GLUCOSE, CAPILLARY
Glucose-Capillary: 111 mg/dL — ABNORMAL HIGH (ref 70–99)
Glucose-Capillary: 100 mg/dL — ABNORMAL HIGH (ref 70–99)

## 2023-10-30 SURGERY — ESOPHAGOSCOPY, WITH DILATION
Anesthesia: General | Site: Throat

## 2023-10-30 MED ORDER — PROPOFOL 10 MG/ML IV BOLUS
INTRAVENOUS | Status: AC
  Filled 2023-10-30: qty 20

## 2023-10-30 MED ORDER — CHLORHEXIDINE GLUCONATE 0.12 % MT SOLN
15.0000 mL | Freq: Once | OROMUCOSAL | Status: AC
  Administered 2023-10-30: 15 mL via OROMUCOSAL
  Filled 2023-10-30: qty 15

## 2023-10-30 MED ORDER — ORAL CARE MOUTH RINSE: 15.0000 mL | Freq: Once | OROMUCOSAL | Status: AC

## 2023-10-30 MED ORDER — PROPOFOL 10 MG/ML IV BOLUS
INTRAVENOUS | Status: DC | PRN
  Administered 2023-10-30: 200 mg via INTRAVENOUS
  Administered 2023-10-30: 100 mg via INTRAVENOUS

## 2023-10-30 MED ORDER — ROCURONIUM BROMIDE 100 MG/10ML IV SOLN
INTRAVENOUS | Status: DC | PRN
  Administered 2023-10-30: 20 mg via INTRAVENOUS

## 2023-10-30 MED ORDER — LACTATED RINGERS IV SOLN: INTRAVENOUS | Status: DC

## 2023-10-30 MED ORDER — FENTANYL CITRATE (PF) 250 MCG/5ML IJ SOLN
INTRAMUSCULAR | Status: DC | PRN
  Administered 2023-10-30: 50 ug via INTRAVENOUS
  Administered 2023-10-30: 100 ug via INTRAVENOUS

## 2023-10-30 MED ORDER — AMISULPRIDE (ANTIEMETIC) 5 MG/2ML IV SOLN: 10.0000 mg | Freq: Once | INTRAVENOUS | Status: DC | PRN

## 2023-10-30 MED ORDER — DEXAMETHASONE SODIUM PHOSPHATE 10 MG/ML IJ SOLN
INTRAMUSCULAR | Status: DC | PRN
  Administered 2023-10-30: 5 mg via INTRAVENOUS

## 2023-10-30 MED ORDER — SUGAMMADEX SODIUM 200 MG/2ML IV SOLN
INTRAVENOUS | Status: DC | PRN
  Administered 2023-10-30: 200 mg via INTRAVENOUS

## 2023-10-30 MED ORDER — MIDAZOLAM HCL 2 MG/2ML IJ SOLN
INTRAMUSCULAR | Status: AC
Start: 2023-10-30 — End: 2023-10-30
  Filled 2023-10-30: qty 2

## 2023-10-30 MED ORDER — ACETAMINOPHEN 10 MG/ML IV SOLN
INTRAVENOUS | Status: DC | PRN
  Administered 2023-10-30: 1000 mg via INTRAVENOUS

## 2023-10-30 MED ORDER — PHENYLEPHRINE 80 MCG/ML (10ML) SYRINGE FOR IV PUSH (FOR BLOOD PRESSURE SUPPORT)
PREFILLED_SYRINGE | INTRAVENOUS | Status: DC | PRN
  Administered 2023-10-30: 160 ug via INTRAVENOUS
  Administered 2023-10-30: 80 ug via INTRAVENOUS

## 2023-10-30 MED ORDER — FENTANYL CITRATE (PF) 250 MCG/5ML IJ SOLN
INTRAMUSCULAR | Status: AC
  Filled 2023-10-30: qty 5

## 2023-10-30 MED ORDER — INSULIN ASPART 100 UNIT/ML IJ SOLN: 0.0000 [IU] | INTRAMUSCULAR | Status: DC | PRN

## 2023-10-30 MED ORDER — ONDANSETRON HCL 4 MG/2ML IJ SOLN
INTRAMUSCULAR | Status: DC | PRN
Start: 2023-10-30 — End: 2023-10-30
  Administered 2023-10-30: 4 mg via INTRAVENOUS

## 2023-10-30 MED ORDER — CARVEDILOL 3.125 MG PO TABS
3.1250 mg | ORAL_TABLET | Freq: Once | ORAL | Status: AC
Start: 2023-10-30 — End: 2023-10-30
  Administered 2023-10-30: 3.125 mg via ORAL
  Filled 2023-10-30: qty 1

## 2023-10-30 MED ORDER — EPINEPHRINE HCL (NASAL) 0.1 % NA SOLN
NASAL | Status: AC
  Filled 2023-10-30: qty 30

## 2023-10-30 MED ORDER — EPINEPHRINE HCL (NASAL) 0.1 % NA SOLN: NASAL | Status: DC | PRN

## 2023-10-30 MED ORDER — FENTANYL CITRATE (PF) 100 MCG/2ML IJ SOLN: 25.0000 ug | INTRAMUSCULAR | Status: DC | PRN

## 2023-10-30 MED ORDER — MIDAZOLAM HCL 2 MG/2ML IJ SOLN
INTRAMUSCULAR | Status: DC | PRN
Start: 2023-10-30 — End: 2023-10-30
  Administered 2023-10-30: 1 mg via INTRAVENOUS

## 2023-10-30 MED ORDER — LIDOCAINE 2% (20 MG/ML) 5 ML SYRINGE
INTRAMUSCULAR | Status: DC | PRN
  Administered 2023-10-30: 100 mg via INTRAVENOUS

## 2023-10-30 MED ORDER — ACETAMINOPHEN 10 MG/ML IV SOLN: 1000.0000 mg | Freq: Once | INTRAVENOUS | Status: DC | PRN

## 2023-10-30 MED ORDER — ACETAMINOPHEN 10 MG/ML IV SOLN
INTRAVENOUS | Status: AC
  Filled 2023-10-30: qty 100

## 2023-10-30 MED ORDER — SUCCINYLCHOLINE CHLORIDE 200 MG/10ML IV SOSY
PREFILLED_SYRINGE | INTRAVENOUS | Status: DC | PRN
  Administered 2023-10-30: 200 mg via INTRAVENOUS

## 2023-10-30 SURGICAL SUPPLY — 23 items
BAG COUNTER SPONGE SURGICOUNT (BAG) ×2 IMPLANT
BALLOON PULM 15 16.5 18X75 (BALLOONS) ×2 IMPLANT
BLADE SURG 15 STRL LF DISP TIS (BLADE) IMPLANT
CANISTER SUCTION 3000ML PPV (SUCTIONS) ×2 IMPLANT
CNTNR URN SCR LID CUP LEK RST (MISCELLANEOUS) IMPLANT
COVER BACK TABLE 60X90IN (DRAPES) ×2 IMPLANT
DRAPE HALF SHEET 40X57 (DRAPES) ×2 IMPLANT
GAUZE SPONGE 4X4 12PLY STRL (GAUZE/BANDAGES/DRESSINGS) IMPLANT
GLOVE BIO SURGEON STRL SZ7.5 (GLOVE) ×2 IMPLANT
GUARD TEETH (MISCELLANEOUS) IMPLANT
KIT BASIN OR (CUSTOM PROCEDURE TRAY) ×2 IMPLANT
KIT TURNOVER KIT B (KITS) ×2 IMPLANT
NDL PRECISIONGLIDE 27X1.5 (NEEDLE) IMPLANT
NEEDLE PRECISIONGLIDE 27X1.5 (NEEDLE) IMPLANT
NS IRRIG 1000ML POUR BTL (IV SOLUTION) ×2 IMPLANT
PAD ARMBOARD POSITIONER FOAM (MISCELLANEOUS) ×4 IMPLANT
PATTIES SURGICAL .5 X3 (DISPOSABLE) ×1 IMPLANT
POSITIONER HEAD DONUT 9IN (MISCELLANEOUS) IMPLANT
SOL ANTI FOG 6CC (MISCELLANEOUS) IMPLANT
SURGILUBE 2OZ TUBE FLIPTOP (MISCELLANEOUS) ×2 IMPLANT
TOWEL GREEN STERILE FF (TOWEL DISPOSABLE) ×4 IMPLANT
TUBE CONNECTING 12X1/4 (SUCTIONS) ×2 IMPLANT
WATER STERILE IRR 1000ML POUR (IV SOLUTION) ×2 IMPLANT

## 2023-10-30 NOTE — Anesthesia Procedure Notes (Signed)
 Procedure Name: Intubation Date/Time: 10/30/2023 9:15 AM  Performed by: Jerl Donald LABOR, CRNAPre-anesthesia Checklist: Patient identified, Emergency Drugs available, Suction available and Patient being monitored Patient Re-evaluated:Patient Re-evaluated prior to induction Oxygen Delivery Method: Circle System Utilized Preoxygenation: Pre-oxygenation with 100% oxygen Induction Type: IV induction Ventilation: Mask ventilation without difficulty and Oral airway inserted - appropriate to patient size Laryngoscope Size: Glidescope and 3 Grade View: Grade I Tube type: Oral Number of attempts: 1 Airway Equipment and Method: Stylet and Oral airway Placement Confirmation: ETT inserted through vocal cords under direct vision, positive ETCO2 and breath sounds checked- equal and bilateral Secured at: 23 cm Tube secured with: Tape Dental Injury: Teeth and Oropharynx as per pre-operative assessment

## 2023-10-30 NOTE — Transfer of Care (Signed)
 Immediate Anesthesia Transfer of Care Note  Patient: Melvin Hudson  Procedure(s) Performed: ESOPHAGOSCOPY, WITH BIOPSY (Throat)  Patient Location: PACU  Anesthesia Type:General  Level of Consciousness: awake, alert , and oriented  Airway & Oxygen Therapy: Patient Spontanous Breathing  Post-op Assessment: Report given to RN, Post -op Vital signs reviewed and stable, and Patient moving all extremities  Post vital signs: Reviewed and stable  Last Vitals:  Vitals Value Taken Time  BP 150/90 10/30/23 09:45  Temp    Pulse 73 10/30/23 09:47  Resp 21 10/30/23 09:47  SpO2 97 % 10/30/23 09:47  Vitals shown include unfiled device data.  Last Pain:  Vitals:   10/30/23 0706  TempSrc:   PainSc: 0-No pain         Complications: No notable events documented.

## 2023-10-30 NOTE — Interval H&P Note (Signed)
 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  The various methods of treatment have been discussed with the patient and family. After consideration of risks, benefits and other options for treatment, the patient has consented to  Procedure(s) with comments: ESOPHAGOSCOPY, WITH DILATION (N/A) - With possible biopsy DILATION, ESOPHAGUS (N/A) as a surgical intervention.  The patient's history has been reviewed, patient examined, no change in status, stable for surgery.  I have reviewed the patient's chart and labs.  Questions were answered to the patient's satisfaction.     Ida Loader

## 2023-10-30 NOTE — Discharge Instructions (Signed)
 Resume diet as tolerated.  Resume Eliquis  on Sunday.

## 2023-10-30 NOTE — Anesthesia Postprocedure Evaluation (Signed)
 Anesthesia Post Note  Patient: Melvin Hudson  Procedure(s) Performed: ESOPHAGOSCOPY, WITH BIOPSY (Throat)     Patient location during evaluation: PACU Anesthesia Type: General Level of consciousness: awake Pain management: pain level controlled Vital Signs Assessment: post-procedure vital signs reviewed and stable Respiratory status: spontaneous breathing, nonlabored ventilation and respiratory function stable Cardiovascular status: blood pressure returned to baseline and stable Postop Assessment: no apparent nausea or vomiting Anesthetic complications: no   No notable events documented.  Last Vitals:  Vitals:   10/30/23 1000 10/30/23 1015  BP: (!) 151/98 (!) 143/89  Pulse: 69 60  Resp: 18 15  Temp:  36.8 C  SpO2: 93% 98%    Last Pain:  Vitals:   10/30/23 0945  TempSrc:   PainSc: 0-No pain                 Delon Aisha Arch

## 2023-10-30 NOTE — Op Note (Signed)
 OPERATIVE REPORT  DATE OF SURGERY: 10/30/2023  PATIENT:  Melvin Hudson,  68 y.o. 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 Following induction of general endotracheal anesthesia, the patient was draped in a standard fashion.  Cervical rigid esophagoscope was passed into the oral cavity through the pharynx and into the esophageal introitus.  At 13.5 cm a hard mass was encountered seem to be arising from the posterolateral wall.  2 biopsies were taken.  There is significant stricture and dilatation was not attempted.  Scope was removed and the patient was awakened and extubated and transferred to recovery in stable condition.    PATIENT DISPOSITION:  To PACU, stable

## 2023-11-02 ENCOUNTER — Encounter (HOSPITAL_COMMUNITY): Payer: Self-pay | Admitting: Otolaryngology

## 2023-11-03 ENCOUNTER — Other Ambulatory Visit: Payer: Self-pay

## 2023-11-04 ENCOUNTER — Other Ambulatory Visit: Payer: Self-pay

## 2023-11-04 LAB — SURGICAL PATHOLOGY

## 2023-11-04 NOTE — Progress Notes (Signed)
 Sent email to [employer] in [employer] Pathology Department regarding case# [id number] done on 10/30/2023.  Case is currently still pending; therefore, requested as to when the pathologist will be done with the case.  Awaiting email response from [place].

## 2023-11-06 ENCOUNTER — Telehealth: Admitting: Adult Health

## 2023-11-06 ENCOUNTER — Encounter: Payer: Self-pay | Admitting: Adult Health

## 2023-11-06 DIAGNOSIS — G4733 Obstructive sleep apnea (adult) (pediatric): Secondary | ICD-10-CM | POA: Diagnosis not present

## 2023-11-06 DIAGNOSIS — C153 Malignant neoplasm of upper third of esophagus: Secondary | ICD-10-CM | POA: Diagnosis not present

## 2023-11-06 DIAGNOSIS — Z87891 Personal history of nicotine dependence: Secondary | ICD-10-CM | POA: Diagnosis not present

## 2023-11-06 NOTE — Progress Notes (Signed)
 Virtual Visit via Video Note  I connected with Melvin Hudson on 11/06/23 at  2:00 PM EDT by a video enabled telemedicine application and verified that I am speaking with the correct person using two identifiers.  Location: Patient: Home  Provider: Office    I discussed the limitations of evaluation and management by telemedicine and the availability of in person appointments. The patient expressed understanding and agreed to proceed.  History of Present Illness: 68 yo male former smoker followed for severe OSA Medical history significant for throat cancer s/p XRT, CHF   Today's video visit is a 9-month follow-up for sleep apnea.  Patient says he is wearing his CPAP every single night.  Says he benefits from CPAP.  Feels that he really cannot sleep without it.  Last visit patient was curious if his sleep apnea had improved since he had an 80 to 100 pound weight loss.  Patient was set up for a repeat home sleep study that was completed on September 06, 2023 that showed mild sleep apnea with a AHI at 5.5/hour.  We discussed that he could potentially stop his CPAP therapy.  Patient says he did stop this for a couple nights but absolutely cannot sleep without his CPAP.  He feels so much better when he wears his CPAP and feels like he can sleep throughout the night when he wears his CPAP.  He has a history of  throat cancer in 2015 s/p radiation treatment.  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  PET scan showed hypermetabolic lesion in the proximal esophagus and a positive paratracheal lymph node with no distant metastasis.  PEG tube was placed.  Patient started chemoradiation May 7 and completed on August 03, 2023.  Patient is following with ENT, and oncology.  Patient is able to consume liquids but no solids.  Does have PEG tube for nutritional support. PET scan on October 23, 2023 showed response to therapy in the upper thoracic esophageal primary and mediastinal nodal metastasis.   Observations/Objective: 11/06/2023 Appears in NAD   Assessment and Plan: History of severe obstructive sleep apnea with significant improvement in his degree of sleep apnea after more than 80 pound weight loss.  Most recent home sleep study completed September 06, 2023 showed very mild sleep apnea with AHI 5.5/hour.  Patient did briefly stop wearing CPAP for couple nights but says that he does not feel comfortable without his CPAP.  He wishes to continue on CPAP therapy.  Recent diagnosis of esophageal cancer -completed chemoradiation.   Continue follow-up with ENT and Oncology   Plan  Patient Instructions  Continue on CPAP at bedtime-try to wear each night for at least 6hr  Saline nasal spray Twice daily   Saline nasal gel At bedtime   Work on healthy weight Do not drive sleepy .  Follow-up in 1 year with Dr.Olalere or Kandie Keiper NP  And As needed      Follow Up Instructions:    I discussed the assessment and treatment plan with the patient. The patient was provided an opportunity to ask questions and all were answered. The patient agreed with the plan and demonstrated an understanding of the instructions.   The patient was advised to call back or seek an in-person evaluation if the symptoms worsen or if the condition fails to improve as anticipated.  I provided 22 minutes of non-face-to-face time during this encounter.  Madelin Stank, NP

## 2023-11-06 NOTE — Patient Instructions (Signed)
 Continue on CPAP at bedtime-try to wear each night for at least 6hr  Saline nasal spray Twice daily   Saline nasal gel At bedtime   Work on healthy weight Do not drive sleepy .  Follow-up in 1 year with Dr.Olalere or Ekin Pilar NP  And As needed

## 2023-11-08 ENCOUNTER — Other Ambulatory Visit: Payer: Self-pay | Admitting: Nurse Practitioner

## 2023-11-09 ENCOUNTER — Encounter: Payer: Self-pay | Admitting: Hematology

## 2023-11-11 ENCOUNTER — Telehealth: Payer: Self-pay | Admitting: Hematology

## 2023-11-11 NOTE — Telephone Encounter (Signed)
 Called and spoke with pt and he is aware of his upcoming appts.

## 2023-11-12 ENCOUNTER — Other Ambulatory Visit: Payer: Self-pay

## 2023-11-12 ENCOUNTER — Inpatient Hospital Stay (HOSPITAL_BASED_OUTPATIENT_CLINIC_OR_DEPARTMENT_OTHER): Admitting: Hematology

## 2023-11-12 DIAGNOSIS — C153 Malignant neoplasm of upper third of esophagus: Secondary | ICD-10-CM | POA: Diagnosis not present

## 2023-11-12 NOTE — Progress Notes (Signed)
 [facility]   Telephone:(336) [phone] [phone]   Clinic Follow up Note   Patient Care Team: Haze Kingfisher, MD as PCP - General (Family Medicine) Michele Richardson, DO as PCP - Cardiology (Cardiology) Clance, Francis HERO, MD as Referring Physician (Pulmonary Disease) Lonn Hicks, MD as Consulting Physician (Hematology and Oncology) [name], MD as Attending Physician (Radiation Oncology) [name], MD as Consulting Physician (Hematology and Oncology) 11/12/2023  I connected with Melvin Hudson on 11/12/23 at  4:00 PM EDT by telephone and verified that I am speaking with the correct person using two identifiers.   I discussed the limitations, risks, security and privacy concerns of performing an evaluation and management service by telephone and the availability of in person appointments. I also discussed with the patient that there may be a patient responsible charge related to this service. The patient expressed understanding and agreed to proceed.   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 -EGD on 05/27/2023 which showed a mass in the proximal esophagus, 2 to 3 cm below introitus.  Biopsy confirmed invasive squamous cell carcinoma.  -PET scan showed hypermetabolic lesion in the proximal esophagus, with a positive peritracheal lymph node.  No distant metastasis -Status post PEG feeding tube placement  -He started chemoRT on 06/24/2023 and completed on 08/03/23 -ED visit for chest pain on 06/29/2023 -repeated upper EGD and biopsy was negative for residual tumor on 10/30/2023  Assessment & Plan Esophageal squamous cell carcinoma, upper third, post-treatment Recent PET scan  suggests possible residual disease despite negative biopsy on endoscopy. Discussed potential for cancer recurrence and the option of immunotherapy to reduce this risk. Immunotherapy is approved for patients post-chemo, radiation, and surgery to prevent recurrence. Discussed risks and benefits of proactive treatment with immunotherapy versus conservative monitoring and treating upon recurrence. He opted for proactive treatment with immunotherapy. - Initiate immunotherapy treatment every four weeks for one year, starting after port placement on October 7. - Proceed with port placement on October 7 for ease of treatment administration. - Schedule first immunotherapy treatment for October 13 or 14, allowing for transportation arrangements.  Plan - I reviewed his recent upper endoscopy and biopsy results, which showed no evidence of residual disease. - Due to the partial response on the PET scan, and he is high risk of recurrence, I recommend 1 year nivolumab every 4 weeks, if he tolerates.  He agrees. - Port placement scheduled for October 7, will start Nivo a week after   SUMMARY OF ONCOLOGIC HISTORY: Oncology History  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HCC)  06/14/2013 Procedure   Biopsy of the tonsil confirmrf squamous cell carcinoma. 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)  05/27/2023 Cancer Staging   Staging form: Esophagus - Squamous Cell Carcinoma, AJCC 8th Edition - Clinical stage from 05/27/2023: Stage Unknown (cTX, cN1, cM0, GX) - Signed by [name], MD on 06/23/2023 Stage prefix: Initial diagnosis Histologic grading system: 3 grade system   06/08/2023 Initial Diagnosis   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)   06/24/2023 -  Chemotherapy   Patient is on Treatment Plan : ESOPHAGUS  Carboplatin  + Paclitaxel  Weekly X 6 Weeks with XRT       Discussed the use of AI scribe software for clinical note transcription with the patient, who gave verbal consent to proceed.  History of Present Illness He is a 68 year old male with squamous cell carcinoma who presents for a follow-up visit regarding immunotherapy treatment. He was referred by Dr. Jesus for follow-up after ENT evaluation.  He has undergone a PET scan, biopsy, and endoscopy. The biopsy and endoscopy showed no evidence of cancer, but the PET scan raised concerns about residual disease. He has previously received chemotherapy, radiation, and surgery for his cancer. He is considering immunotherapy as the next step in his treatment and has an appointment for port placement on October 7th. He is also exploring the possibility of a throat stretching procedure with Dr. Elbridge, which is feasible alongside immunotherapy.     REVIEW OF SYSTEMS:   Constitutional: Denies fevers, chills or abnormal weight loss Eyes: Denies blurriness of vision Ears, nose, mouth, throat, and face: Denies mucositis or sore throat Respiratory: Denies cough, dyspnea or wheezes Cardiovascular: Denies palpitation, chest discomfort or lower extremity swelling Gastrointestinal:  Denies nausea, heartburn or change in bowel habits Skin: Denies abnormal skin rashes Lymphatics: Denies new lymphadenopathy or easy bruising Neurological:Denies numbness, tingling or new weaknesses Behavioral/Psych: Mood is stable, no new changes  All other systems were reviewed with the patient and are negative.  MEDICAL HISTORY:  Past Medical History:  Diagnosis Date   Angioedema 09/17/2011   tongue and lips   Arthritis    both of my legs and feet (01/05/2014)   CHF (congestive heart failure) (HCC)    Diabetes mellitus without complication (HCC)    DVT (deep venous thrombosis) (HCC) 06/16/2023   LLE   Erectile dysfunction 04/20/2018   GI bleed    Heart murmur  04/20/2018   History of radiation therapy    06/24/2023- 08/03/2023 Dr. 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    SURGICAL HISTORY: Past Surgical History:  Procedure Laterality Date   COLONOSCOPY Left 04/11/2014   Procedure: COLONOSCOPY;  Surgeon: Elsie Cree, MD;  Location: [facility] ENDOSCOPY;  Service: Endoscopy;  Laterality: Left;   COLONOSCOPY N/A 04/20/2014   Procedure: COLONOSCOPY;  Surgeon: Jerrell KYM Sol, MD;  Location: [facility] ENDOSCOPY;  Service: Endoscopy;  Laterality: N/A;   COLONOSCOPY W/ BIOPSIES AND POLYPECTOMY     benign   DIRECT LARYNGOSCOPY  01/05/2014   DIRECT LARYNGOSCOPY N/A 01/05/2014   Procedure: DIRECT LARYNGOSCOPY AND BIOPSY;  Surgeon: Ida Jesus, MD;  Location: [facility] OR;  Service: ENT;  Laterality: N/A;   ESOPHAGOSCOPY WITH DILITATION N/A 05/27/2023   Procedure: ESOPHAGOSCOPY;  Surgeon: Jesus Ida, MD;  Location: [facility] OR;  Service: ENT;  Laterality: N/A;   ESOPHAGOSCOPY WITH DILITATION N/A 10/30/2023   Procedure: ESOPHAGOSCOPY, WITH BIOPSY;  Surgeon: Jesus Ida, MD;  Location[facility] OR;  Service: ENT;  Laterality: N/A;  With possible biopsy   IR GASTROSTOMY TUBE MOD SED  06/12/2023   IR RADIOLOGIST EVAL & MGMT  08/10/2023   IR RADIOLOGIST EVAL & MGMT  08/14/2023   KNEE ARTHROSCOPY Right ~ 1994   LEFT AND RIGHT HEART CATHETERIZATION  WITH CORONARY/GRAFT ANGIOGRAM N/A 12/31/2010   Procedure: LEFT AND RIGHT HEART CATHETERIZATION WITH EL BILE;  Surgeon: Erick JONELLE Bergamo, MD;  Location: [facility] CATH LAB;  Service: Cardiovascular;  Laterality: N/A;   MULTIPLE EXTRACTIONS WITH ALVEOLOPLASTY N/A 07/08/2013   Procedure: Extraction of tooth #'s 22, 27 with alveoloplasty and bilateal mandibular facial exostoses reductions;  Surgeon: Tanda JULIANNA Fanny, DDS;   Location: [facility] ORS;  Service: Oral Surgery;  Laterality: N/A;   MULTIPLE TOOTH EXTRACTIONS  ~ 2008    I have reviewed the social history and family history with the patient and they are unchanged from previous note.  ALLERGIES:  is allergic to azilsartan and advil [ibuprofen].  MEDICATIONS:  Current Outpatient Medications  Medication Sig Dispense Refill   albuterol  (VENTOLIN  HFA) 108 (90 Base) MCG/ACT inhaler Inhale 2 puffs into the lungs every 4 (four) hours as needed for wheezing (Cough).     allopurinol  (ZYLOPRIM ) 300 MG tablet Take 300 mg by mouth every other day.     APIXABAN  (ELIQUIS ) VTE STARTER PACK (10MG  AND 5MG ) Take as directed on package: start with two-5mg  tablets twice daily for 7 days. On day 8, switch to one-5mg  tablet twice daily. 74 each 0   atorvastatin  (LIPITOR) 40 MG tablet Take 1 tablet (40 mg total) by mouth at bedtime. 90 tablet 1   bismuth subsalicylate (PEPTO BISMOL) 262 MG/15ML suspension Take 30 mLs by mouth every 6 (six) hours as needed for indigestion or diarrhea or loose stools.     Blood Glucose Monitoring Suppl (ONETOUCH VERIO FLEX SYSTEM) w/Device KIT USE TO CHECK BLOOD SUGAR TWICE DAILY     calcium  carbonate (TUMS - DOSED IN MG ELEMENTAL CALCIUM ) 500 MG chewable tablet Chew 2 tablets (400 mg of elemental calcium  total) by mouth 3 (three) times daily with meals. 180 tablet 1   carvedilol  (COREG ) 3.125 MG tablet Take 3.125 mg by mouth 2 (two) times daily with a meal.     clonazePAM  (KLONOPIN ) 1 MG disintegrating tablet Take 1 mg by mouth daily.     dapagliflozin  propanediol (FARXIGA ) 10 MG TABS tablet Take 10 mg by mouth daily.     diphenhydrAMINE  (BENADRYL ) 25 MG tablet Take 1 tablet (25 mg total) by mouth every 6 (six) hours. 20 tablet 0   ELIQUIS  5 MG TABS tablet TAKE 1 TABLET(5 MG) BY MOUTH TWICE DAILY 60 tablet 1   famotidine  (PEPCID ) 40 MG/5ML suspension PLACE 2.5 MILLILITERS INTO THE FEEDING TUBE DAILY 50 mL 1   gabapentin  (NEURONTIN ) 300 MG capsule Take  300 mg by mouth 3 (three) times daily.   2   HYDROcodone -acetaminophen  (HYCET) 7.5-325 mg/15 ml solution Take 15 mLs by mouth 4 (four) times daily as needed for moderate pain (pain score 4-6). 473 mL 0   hydrocortisone  (ANUSOL -HC) 2.5 % rectal cream Place 1 Application rectally daily as needed for hemorrhoids or anal itching.     isosorbide -hydrALAZINE  (BIDIL ) 20-37.5 MG tablet Take 1 tablet by mouth in the morning and at bedtime. 90 tablet 3   lactose free nutrition (BOOST) LIQD Take 237 mLs by mouth daily.     lidocaine  (XYLOCAINE ) 2 % solution MIX 1 PART(5ML) OF LIDOCAINE  AND 1 PART WATER ( ). SWALLOW OF DILUTED MIXTURE 30 MINUTES BEFORE MEALS AND BEDTIME FOR UP TO FOUR TIMES DAILY 200 mL 3   LORazepam  (ATIVAN ) 0.5 MG tablet Take 1 tablet PRN claustrophobia 20 minutes prior to radiation. Will need a driver. Use only as needed. (Patient not taking: Reported on 09/01/2023) 5 tablet 0  nystatin  (MYCOSTATIN ) 100000 UNIT/ML suspension SHAKE LIQUID AND TAKE 10 ML BY MOUTH FOUR TIMES DAILY FOR THRUSH. STOP WHEN THRUSH RESOLVES 473 mL 2   nystatin  (MYCOSTATIN ) 100000 UNIT/ML suspension Take 10 mLs (1,000,000 Units total) by mouth 4 (four) times daily. For oral thrush. Stop when thrush resolves 473 mL 0   omeprazole  (PRILOSEC) 20 MG capsule Take 20 mg by mouth daily.  (Patient not taking: Reported on 10/29/2023)  4   ondansetron  (ZOFRAN ) 8 MG tablet Take 1 tablet (8 mg total) by mouth every 8 (eight) hours as needed for nausea or vomiting. Start on the third day after chemotherapy. (Patient not taking: Reported on 09/01/2023) 30 tablet 1   ONETOUCH VERIO test strip USE TO CHECK BLOOD SUGAR TWICE DAILY     polyethylene glycol (MIRALAX  / GLYCOLAX ) 17 g packet Take 17 g by mouth daily. 14 each 0   prochlorperazine  (COMPAZINE ) 10 MG tablet Take 1 tablet (10 mg total) by mouth every 6 (six) hours as needed for nausea or vomiting. (Patient not taking: Reported on 10/29/2023) 30 tablet 1   Protein (FEEDING  SUPPLEMENT, PROSOURCE TF20,) liquid Place 60 mLs into feeding tube 2 (two) times daily.     psyllium (HYDROCIL/METAMUCIL) 95 % PACK Take 1 packet by mouth daily. 240 each 0   senna-docusate (SENOKOT-S) 8.6-50 MG tablet Take 1 tablet by mouth 2 (two) times daily. (Patient not taking: Reported on 10/29/2023) 60 tablet 1   Water  For Irrigation, Sterile (FREE WATER ) SOLN Place 100 mLs into feeding tube every 6 (six) hours.     No current facility-administered medications for this visit.    PHYSICAL EXAMINATION: Not performed   LABORATORY DATA:  I have reviewed the data as listed    Latest Ref Rng & Units 10/29/2023    8:36 AM 09/17/2023    9:56 AM 08/20/2023   12:18 PM  CBC  WBC 4.0 - 10.5 K/uL 5.7  4.9  4.2   Hemoglobin 13.0 - 17.0 g/dL 86.5  87.8  88.6   Hematocrit 39.0 - 52.0 % 38.3  35.7  33.6   Platelets 150 - 400 K/uL 203  164  259         Latest Ref Rng & Units 10/29/2023    8:36 AM 09/17/2023    9:56 AM 08/20/2023   12:18 PM  CMP  Glucose 70 - 99 mg/dL 892  83  866   BUN 8 - 23 mg/dL 8  9  12    Creatinine 0.61 - 1.24 mg/dL 9.30  9.31  9.28   Sodium 135 - 145 mmol/L 141  141  140   Potassium 3.5 - 5.1 mmol/L 3.9  4.1  4.0   Chloride 98 - 111 mmol/L 104  106  105   CO2 22 - 32 mmol/L 35  32  32   Calcium  8.9 - 10.3 mg/dL 9.0  9.0  9.1   Total Protein 6.5 - 8.1 g/dL 6.9  6.5  6.5   Total Bilirubin 0.0 - 1.2 mg/dL 0.8  0.8  0.8   Alkaline Phos 38 - 126 U/L 138  110  106   AST 15 - 41 U/L 16  17  17    ALT 0 - 44 U/L 14  12  12        RADIOGRAPHIC STUDIES: I have personally reviewed the radiological images as listed and agreed with the findings in the report. No results found.     I discussed the assessment and treatment plan with  the patient. The patient was provided an opportunity to ask questions and all were answered. The patient agreed with the plan and demonstrated an understanding of the instructions.   The patient was advised to call back or seek an in-person  evaluation if the symptoms worsen or if the condition fails to improve as anticipated.  I provided 25 minutes of non face-to-face telephone visit time during this encounter, including review of chart and various tests results, discussions about plan of care and coordination of care plan.    Onita Mattock, MD 11/12/23

## 2023-11-12 NOTE — Progress Notes (Signed)
 ON PATHWAY REGIMEN - Gastroesophageal  No Change  Continue With Treatment as Ordered.  Original Decision Date/Time: 06/17/2023 21:47     A cycle is every 7 days, concurrent with RT:     Paclitaxel       Carboplatin    **Always confirm dose/schedule in your pharmacy ordering system**  Patient Characteristics: Esophageal & GE Junction, Squamous Cell, Preoperative or Nonsurgical Candidate, M0 (Clinical Staging), cT2 or Higher or cN+, Unresectable/Nonsurgical Candidate (Any cT) Therapeutic Status: Preoperative or Nonsurgical Candidate, M0 (Clinical Staging) Histology: Squamous Cell Disease Classification: Esophageal AJCC M Category: cM0 AJCC 8 Stage Grouping: Unknown AJCC Grade: G3 AJCC Location: Upper AJCC T Category: cTX AJCC N Category: cN1 Intent of Therapy: Curative Intent, Discussed with Patient

## 2023-11-12 NOTE — Assessment & Plan Note (Signed)
-  cTxN1M0, invasive squamous cell carcinoma -Present with progressive dysphagia since December 2024. -EGD on 05/27/2023 which showed a mass in the proximal esophagus, 2 to 3 cm below introitus.  Biopsy confirmed invasive squamous cell carcinoma.  -PET scan showed hypermetabolic lesion in the proximal esophagus, with a positive peritracheal lymph node.  No distant metastasis -Status post PEG feeding tube placement  -He started chemoRT on 06/24/2023 and completed on 08/03/23 -ED visit for chest pain on 06/29/2023 -repeated upper EGD and biopsy was negative for residual tumor on 10/30/2023

## 2023-11-12 NOTE — Progress Notes (Signed)
 DISCONTINUE ON PATHWAY REGIMEN - Gastroesophageal     A cycle is every 7 days, concurrent with RT:     Paclitaxel       Carboplatin    **Always confirm dose/schedule in your pharmacy ordering system**  PRIOR TREATMENT: [id number]: Carboplatin  + Paclitaxel  (2/50) Weekly (x 5-6 Weeks) with Concurrent RT  START ON PATHWAY REGIMEN - Gastroesophageal     Cycles 1 through 8: A cycle is every 14 days:     Nivolumab    Cycles 9 and beyond: A cycle is every 28 days:     Nivolumab   **Always confirm dose/schedule in your pharmacy ordering system**  Patient Characteristics: Esophageal & GE Junction, Squamous Cell, Post-Neoadjuvant Therapy and Resection, M0 (Neoadjuvant Pathologic Staging), ypT+ or ypN+ Therapeutic Status: Post-Neoadjuvant Therapy and Resection, M0 (Neoadjuvant Pathologic Staging) Histology: Squamous Cell Disease Classification: Esophageal AJCC M Category: cM0 AJCC 8 Stage Grouping: Unknown AJCC N Category: ypNX AJCC Grade: GX AJCC T Category: ypTX AJCC Location: Upper Intent of Therapy: Curative Intent, Discussed with Patient

## 2023-11-13 ENCOUNTER — Ambulatory Visit

## 2023-11-13 ENCOUNTER — Telehealth: Payer: Self-pay | Admitting: Hematology

## 2023-11-13 DIAGNOSIS — R1312 Dysphagia, oropharyngeal phase: Secondary | ICD-10-CM

## 2023-11-13 NOTE — Therapy (Signed)
 OUTPATIENT SPEECH LANGUAGE PATHOLOGY SWALLOW TREATMENT/RECERTIFICATION   Patient Name: Melvin Hudson MRN: [id number] DOB:[birth year], 68 y.o., male Today's Date: 11/13/2023  PCP: Haze Kingfisher, MD REFERRING PROVIDER: [name], MD  END OF SESSION:  End of Session - 11/13/23 0805     Visit Number 4    Number of Visits 7    Date for Recertification  02/11/24    Authorization - Number of Visits 4    SLP Start Time 0801    SLP Stop Time  0840    SLP Time Calculation (min) 39 min    Activity Tolerance Patient tolerated treatment well            Past Medical History:  Diagnosis Date   Angioedema 09/17/2011   tongue and lips   Arthritis    both of my legs and feet (01/05/2014)   CHF (congestive heart failure) (HCC)    Diabetes mellitus without complication (HCC)    DVT (deep venous thrombosis) (HCC) 06/16/2023   LLE   Erectile dysfunction 04/20/2018   GI bleed    Heart murmur 04/20/2018   History of radiation therapy    06/24/2023- 08/03/2023 Dr. 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   Past Surgical History:  Procedure Laterality Date   COLONOSCOPY Left 04/11/2014   Procedure: COLONOSCOPY;  Surgeon: Elsie Cree, MD;  Location: [facility] ENDOSCOPY;  Service: Endoscopy;  Laterality: Left;   COLONOSCOPY N/A 04/20/2014   Procedure: COLONOSCOPY;  Surgeon: Jerrell KYM Sol, MD;  Location: [facility] ENDOSCOPY;  Service: Endoscopy;  Laterality: N/A;   COLONOSCOPY W/ BIOPSIES AND POLYPECTOMY     benign   DIRECT LARYNGOSCOPY  01/05/2014   DIRECT LARYNGOSCOPY N/A 01/05/2014   Procedure: DIRECT LARYNGOSCOPY AND BIOPSY;  Surgeon: Ida Loader, MD;  Location: [facility] OR;  Service: ENT;  Laterality: N/A;   ESOPHAGOSCOPY WITH DILITATION N/A 05/27/2023   Procedure:  ESOPHAGOSCOPY;  Surgeon: Loader Ida, MD;  Location: [facility] OR;  Service: ENT;  Laterality: N/A;   ESOPHAGOSCOPY WITH DILITATION N/A 10/30/2023   Procedure: ESOPHAGOSCOPY, WITH BIOPSY;  Surgeon: Loader Ida, MD;  Location: [facility] OR;  Service: ENT;  Laterality: N/A;  With possible biopsy   IR GASTROSTOMY TUBE MOD SED  06/12/2023   IR RADIOLOGIST EVAL & MGMT  08/10/2023   IR RADIOLOGIST EVAL & MGMT  08/14/2023   KNEE ARTHROSCOPY Right ~ 1994   LEFT AND RIGHT HEART CATHETERIZATION WITH CORONARY/GRAFT ANGIOGRAM N/A 12/31/2010   Procedure: LEFT AND RIGHT HEART CATHETERIZATION WITH EL BILE;  Surgeon: Erick JONELLE Bergamo, MD;  Location: [facility] CATH LAB;  Service: Cardiovascular;  Laterality: N/A;   MULTIPLE EXTRACTIONS WITH ALVEOLOPLASTY N/A 07/08/2013   Procedure: Extraction of tooth #'s 22, 27 with alveoloplasty and bilateal mandibular facial exostoses reductions;  Surgeon: Tanda JULIANNA Fanny, DDS;  Location: [facility] ORS;  Service: Oral Surgery;  Laterality: N/A;   MULTIPLE TOOTH EXTRACTIONS  ~ 2008   Patient Active Problem List   Diagnosis Date Noted   Malnutrition of moderate degree 06/12/2023   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   Dysphagia 03/02/2023   Hypocalcemia 04/17/2022   Anxiety state 04/15/2022   AKI (acute kidney injury) 04/15/2022   Chronic diastolic heart failure (HCC) 04/15/2022   Syncope 04/15/2022  Nonspecific abnormal electrocardiogram (ECG) (EKG) 04/15/2022   GI bleed 04/13/2022   PICC (peripherally inserted central catheter) flush 01/01/2022   Status post radiation therapy 12/05/2020   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   Edentulous 12/05/2020   Ill-fitting dentures 12/05/2020   Former smoker 11/11/2019   Healthcare maintenance 11/11/2019   Bilateral leg edema 04/21/2018   Heart murmur 04/20/2018   Erectile dysfunction 04/20/2018   Pure hypercholesterolemia 04/20/2018   Obesity, Class III, BMI 40-49.9 (morbid  obesity)    BRBPR (bright red blood per rectum) 08/30/2016   Chronic pain syndrome    History of GI diverticular bleed 04/18/2014   Hematochezia 04/10/2014   Symptomatic anemia 04/08/2014   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   Mouth pain 07/18/2013   Weight loss 07/18/2013   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HCC) 06/29/2013   OSA (obstructive sleep apnea) 11/19/2011   Angio-edema 09/17/2011   HTN (hypertension) 09/17/2011   Gout 09/17/2011   Speech Therapy Progress Note  Dates of Reporting Period: 11/01/23 to present  Subjective Statement: Pt has been seen for 4 sessions of swallowing therapy  Objective: See below  Goal Update: See below  Plan: See pt for one more session  Reason Skilled Services are Required: Ensure progress over time  ONSET DATE: script dated 06/08/23  REFERRING DIAG: C09.9 (ICD-10-CM) - Malignant neoplasm of tonsil (HCC) C15.3 (ICD-10-CM) - Malignant neoplasm of upper third esophagus (HCC)  THERAPY DIAG:  No diagnosis found.  Rationale for Evaluation and Treatment: Rehabilitation  SUBJECTIVE:   SUBJECTIVE STATEMENT: I'm doing one can a day (PEG). I am holding weight..Having a lot of ice cream. Pt arrived today even though he and SLP agreed he could come every 8 week frequency.  Pt accompanied by: self   PERTINENT HISTORY: See above. Tonsillar cancer 2015 Jakie thought rt-sided) treated with rad tx by Dr. Izell.   PAIN:  Are you having pain? No  FALLS: Has patient fallen in last 6 months?  No  PATIENT GOALS: Find out what's going on  OBJECTIVE:  Note: Objective measures were completed at Evaluation unless otherwise noted. OBJECTIVE:   DIAGNOSTIC FINDINGS:  DG ESOPHAGUS Jan 2025: IMPRESSION: Moderate persistent narrowing of esophagus at cervicothoracic junction. It is uncertain if this is due to external compression or stricture or mass. Endoscopy is recommended as well as potentially CT scan of the chest with intravenous  contrast.   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  INSTRUMENTAL SWALLOW STUDY FINDINGS (MBSS) None noted  PATIENT REPORTED OUTCOME MEASURES (PROM): EAT-10: Pt completed 09/18/23 with EAT -10 score of 24/40 with higher scores indicating  pt's swallowing deficits negatively affect QoL.                                                                                                                             TREATMENT DATE:   11/13/23: Pt and SLP agreed pt could again be seen in 8 weeks - cancelled  appointment for October.  Pt with biopsy Tuesday 11/10/23 which was benign. Throat dilation scheduled for the next 2-4 weeks. He may also initiate immunotherapy, from Dr Demetra note. Today he had applesauce without any overt s/sx difficulty but when he ate cereal bar had what sounded like regurgitation of liquids, and pt reported he had to push it down. SLP strongly encouraged him to eat pureed or liquid foods (smoothies, soups). Pt is still using PEG - 1/day.  With HEP pt cont to not complete regularly. SLP again provided rationale for pt. Pt req'd rare min A for the two exercises.  10/15/23: Pt to see about esophageal dilation 11/29/23. Pt eating cereal bars, chips, soups, cheese, mashed potatoes. Scrambled eggs and corn beef hash with potatoes - pt reports reduced pharyngeal clearance/residue. SLP suggested smoothies - pt will try these. With POs today, cereal bar and juice - no overt s/sx swallowing difficulty. With whole peanut butter crackers and juice pt had coughing and stated, I won't try that again. That's what I do. If I start coughing like that I just put it up. SLP affirmed pt's decision and educated that was sign of dysphagia, but had him try again with 1/3 of cracker bites and that smaller bites/sips might not bring the same symptoms. First bite was without overt s/sx, but second 1/3 cracker bite resulted in coughing and a strange squeaking sound. SLP suspects tight  UES - possibly some of cracker bolus caught in UES (?).  Pt has not been performing HEP. Today he req'd mod A with both exercises faded to independent. Pt was given rationale for HEP. SLP strongly reiterated pt complete 20-30 reps of each of the exercises/day. He and SLP agreed pt could be seen again in 8 weeks  09/18/23: I experiment with soft things that can go down easy and not choke me. Vienna sausage, oatmeal, soups, ice cream, eggs. Still having 4 tube feedings/day. Sometimes those eggs get a little too hard. I have to cook them softer. Pt coughs eggs up and spits them out if they are too rubbery. SLP suggested scrambled eggs and adding cream or half and half to make them more wet, maybe having gravy with them. SLP also told pt to use gravy and other sauces to make food more slick for easier pharyngeal passage. He ate cereal bar and drank thin liquids today without any oral s/sx difficulty, or overt s/sx pharyngeal deficits. He has not been doing HEP. He req'd consistent mod A with Mendelsohn and effortful, and was independent at task completion.   08/02/24: SLP educated pt on swallow exercises today including effortful swallow, and Mendelsohn maneuver. He req'd mod A faded to min A with both exercises. He was told to complete 20 swallows each day for each of these. SLP also provided information about aspiration PNA. See pt instructions.  PATIENT EDUCATION: Education details: see treatment date for today's date Person educated: Patient and sister Education method: Explanation, Demonstration, Verbal cues, and Handouts Education comprehension: verbalized understanding, returned demonstration, verbal cues required, and needs further education   ASSESSMENT:  CLINICAL IMPRESSION: Patient is a 68 y.o. M who was seen today for treatment following RT for esophageal cancer. SLP believes that pt's sx do not require MBS at this time due to pt response to coughing sx - he will just put it up. SLP  suspects pt's dysphagia sx will improve after esophageal dilation. MBS may be necessary the future. Pt reported a year ago he did not have any s/sx  of coughing with POs. If pt appears WNL after next session d/c will be considered with instructions to get MBS if swallowing status worsens.  OBJECTIVE IMPAIRMENTS: include dysphagia. These impairments are limiting patient from safety when swallowing. Factors affecting potential to achieve goals and functional outcome are co-morbidities, previous level of function, and severity of impairments. Patient will benefit from skilled SLP services to address above impairments and improve overall function.  REHAB POTENTIAL: Good   GOALS: Goals reviewed with patient? No  SHORT TERM GOALS: Target date:  10/16/23 due to visit number  Pt complete HEP with mod I  Baseline: Goal status: not met  2.  Pt demo knowledge of sx of pharyngeal dysphagia Baseline:  Goal status: not met  3.  Pt demo knowledge of overt s/sx aspiration over two sessions Baseline:  Goal status: partially met  4.  Pt will demo knowledge of how a food journal can assist return to pre-rad tx diet Baseline:  Goal status: not met - pt satisfied with his choices at this time   LONG TERM GOALS: Target date: 02/11/24  Pt will undergo MBS when clinically appropriate Baseline:  Goal status: INITIAL  2.  Pt will complete HEP with mod I over two sessions Baseline:  Goal status: INITIAL  3.  Pt demo knowledge of overt s/sx aspiration PNA over two sessions Baseline:  Goal status: INITIAL  4.  Pt will improve score on PROM Baseline:  Goal status: INITIAL   PLAN:  SLP FREQUENCY: every approx 8 weeks  SLP DURATION: 7 sessions  PLANNED INTERVENTIONS: Aspiration precaution training, Pharyngeal strengthening exercises, Diet toleration management , Environmental controls, Trials of upgraded texture/liquids, Cueing hierachy, Internal/external aids, Oral motor exercises, SLP  instruction and feedback, Compensatory strategies, Patient/family education, and [id number] Treatment of swallowing function    Lauralyn Shadowens, CCC-SLP 11/13/2023, 8:46 AM

## 2023-11-13 NOTE — Telephone Encounter (Signed)
 Melvin Hudson has been scheduled for his first cycle of treatment and he has been made aware of all appointments scheduled for 10/8 and 10/13.

## 2023-11-17 LAB — MOLECULAR PATHOLOGY

## 2023-11-20 ENCOUNTER — Other Ambulatory Visit: Payer: Self-pay | Admitting: Hematology

## 2023-11-23 ENCOUNTER — Other Ambulatory Visit: Payer: Self-pay

## 2023-11-23 NOTE — H&P (Signed)
 Chief Complaint: Esophageal squamous cell carcinoma, upper third; referred for port a cath placement to assist with treatment  Referring Provider(s): [name], MD  Supervising Physician: Karalee Beat  Patient Status: [facility] - Out-pt  History of Present Illness: Melvin Hudson is a 68 y.o. male with medical history significant for squamous cell carcinoma of esophagus (diagnosed 05/2013), CHF, diabetes, DVT, and HTN. Repeat PET scan 10/2023 revealed possible residual disease per oncology. Received request for port-a-cath to be placed by Interventional Radiology for oncology immunotherapy treatment plan.    Allergies Reviewed:  Azilsartan and Advil [ibuprofen]    Patient is Full Code  Past Medical History:  Diagnosis Date   Angioedema 09/17/2011   tongue and lips   Arthritis    both of my legs and feet (01/05/2014)   CHF (congestive heart failure) (HCC)    Diabetes mellitus without complication (HCC)    DVT (deep venous thrombosis) (HCC) 06/16/2023   LLE   Erectile dysfunction 04/20/2018   GI bleed    Heart murmur 04/20/2018   History of radiation therapy    06/24/2023- 08/03/2023 Dr. 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    Past Surgical History:  Procedure Laterality Date   COLONOSCOPY Left 04/11/2014   Procedure: COLONOSCOPY;  Surgeon: Elsie Cree, MD;  Location: [facility] ENDOSCOPY;  Service: Endoscopy;  Laterality: Left;   COLONOSCOPY N/A 04/20/2014   Procedure: COLONOSCOPY;  Surgeon: Jerrell KYM Sol, MD;  Location: [facility] ENDOSCOPY;  Service: Endoscopy;  Laterality: N/A;   COLONOSCOPY W/ BIOPSIES AND POLYPECTOMY     benign   DIRECT LARYNGOSCOPY  01/05/2014   DIRECT LARYNGOSCOPY N/A 01/05/2014   Procedure: DIRECT LARYNGOSCOPY AND BIOPSY;  Surgeon:  Ida Loader, MD;  Location: [facility] OR;  Service: ENT;  Laterality: N/A;   ESOPHAGOSCOPY WITH DILITATION N/A 05/27/2023   Procedure: ESOPHAGOSCOPY;  Surgeon: Loader Ida, MD;  Location: [facility] OR;  Service: ENT;  Laterality: N/A;   ESOPHAGOSCOPY WITH DILITATION N/A 10/30/2023   Procedure: ESOPHAGOSCOPY, WITH BIOPSY;  Surgeon: Loader Ida, MD;  Location: [facility] OR;  Service: ENT;  Laterality: N/A;  With possible biopsy   IR GASTROSTOMY TUBE MOD SED  06/12/2023   IR RADIOLOGIST EVAL & MGMT  08/10/2023   IR RADIOLOGIST EVAL & MGMT  08/14/2023   KNEE ARTHROSCOPY Right ~ 1994   LEFT AND RIGHT HEART CATHETERIZATION WITH CORONARY/GRAFT ANGIOGRAM N/A 12/31/2010   Procedure: LEFT AND RIGHT HEART CATHETERIZATION WITH EL BILE;  Surgeon: Erick JONELLE Bergamo, MD;  Location: [facility] CATH LAB;  Service: Cardiovascular;  Laterality: N/A;   MULTIPLE EXTRACTIONS WITH ALVEOLOPLASTY N/A 07/08/2013   Procedure: Extraction of tooth #'s 22, 27 with alveoloplasty and bilateal mandibular facial exostoses reductions;  Surgeon: Tanda JULIANNA Fanny, DDS;  Location: [facility] ORS;  Service: Oral Surgery;  Laterality: N/A;   MULTIPLE TOOTH EXTRACTIONS  ~ 2008      Medications: Prior to Admission medications   Medication Sig Start Date End Date Taking? Authorizing Provider  albuterol  (VENTOLIN  HFA) 108 (90 Base) MCG/ACT inhaler Inhale 2 puffs into the lungs every 4 (four) hours as needed for wheezing (Cough). 04/02/23   [provider]  allopurinol  (ZYLOPRIM ) 300 MG tablet Take 300 mg by mouth every other day.    [provider]  APIXABAN  (ELIQUIS ) VTE STARTER PACK (10MG  AND 5MG ) Take as directed on package: start with two-5mg  tablets twice daily for 7 days. On day 8, switch to one-5mg  tablet twice daily. 06/17/23   [name], MD  atorvastatin  (LIPITOR) 40 MG tablet Take 1 tablet (40 mg total) by mouth at bedtime. 10/07/19 10/30/23  [name], [name], DO  bismuth subsalicylate (PEPTO BISMOL) 262 MG/15ML suspension Take 30 mLs  by mouth every 6 (six) hours as needed for indigestion or diarrhea or loose stools.    [provider]  Blood Glucose Monitoring Suppl (ONETOUCH VERIO FLEX SYSTEM) w/Device KIT USE TO CHECK BLOOD SUGAR TWICE DAILY 04/21/19   [provider]  calcium  carbonate (TUMS - DOSED IN MG ELEMENTAL CALCIUM ) 500 MG chewable tablet Chew 2 tablets (400 mg of elemental calcium  total) by mouth 3 (three) times daily with meals. 04/18/22   [name], DO  carvedilol  (COREG ) 3.125 MG tablet Take 3.125 mg by mouth 2 (two) times daily with a meal.    [provider]  clonazePAM  (KLONOPIN ) 1 MG disintegrating tablet Take 1 mg by mouth daily. 03/10/22   [provider]  dapagliflozin  propanediol (FARXIGA ) 10 MG TABS tablet Take 10 mg by mouth daily.    [provider]  diphenhydrAMINE  (BENADRYL ) 25 MG tablet Take 1 tablet (25 mg total) by mouth every 6 (six) hours. 10/29/13   [name], MD  ELIQUIS  5 MG TABS tablet TAKE 1 TABLET(5 MG) BY MOUTH TWICE DAILY 11/09/23   [name], MD  famotidine  (PEPCID ) 40 MG/5ML suspension PLACE 2.5 MILLILITERS INTO THE FEEDING TUBE DAILY 11/20/23   [name], MD  gabapentin  (NEURONTIN ) 300 MG capsule Take 300 mg by mouth 3 (three) times daily.  01/19/18   [provider]  HYDROcodone -acetaminophen  (HYCET) 7.5-325 mg/15 ml solution Take 15 mLs by mouth 4 (four) times daily as needed for moderate pain (pain score 4-6). 08/31/23   Boscia, Heather E, NP  hydrocortisone  (ANUSOL -HC) 2.5 % rectal cream Place 1 Application rectally daily as needed for hemorrhoids or anal itching. 04/29/23   [provider]  isosorbide -hydrALAZINE  (BIDIL ) 20-37.5 MG tablet Take 1 tablet by mouth in the morning and at bedtime. 04/20/23   [name], [name], DO  lactose free nutrition (BOOST) LIQD Take 237 mLs by mouth daily.    [provider]  lidocaine  (XYLOCAINE ) 2 % solution MIX 1 PART(5ML) OF LIDOCAINE  AND 1 PART WATER ( ). SWALLOW OF DILUTED  MIXTURE 30 MINUTES BEFORE MEALS AND BEDTIME FOR UP TO FOUR TIMES DAILY 10/20/23   [name], MD  LORazepam  (ATIVAN ) 0.5 MG tablet Take 1 tablet PRN claustrophobia 20 minutes prior to radiation. Will need a driver. Use only as needed. Patient not taking: Reported on 09/01/2023 06/19/23   [name], MD  nystatin  (MYCOSTATIN ) 100000 UNIT/ML suspension SHAKE LIQUID AND TAKE 10 ML BY MOUTH FOUR TIMES DAILY FOR THRUSH. STOP WHEN THRUSH RESOLVES 08/13/23   Boscia, Heather E, NP  nystatin  (MYCOSTATIN ) 100000 UNIT/ML suspension Take 10 mLs (1,000,000 Units total) by mouth 4 (four) times daily. For oral thrush. Stop when thrush resolves 10/29/23   [name], MD  omeprazole  (PRILOSEC) 20 MG capsule Take 20 mg by mouth daily.  Patient not taking: Reported on 10/29/2023 06/04/16   [provider]  [facility] VERIO test strip USE TO CHECK BLOOD SUGAR TWICE DAILY 07/20/19   [provider]  polyethylene glycol (MIRALAX  / GLYCOLAX ) 17 g packet Take 17 g by mouth daily. 04/19/22   [name], DO  Protein (FEEDING SUPPLEMENT, PROSOURCE TF20,) liquid Place 60 mLs into feeding tube 2 (two) times daily. 06/17/23   Dahal, Chapman, MD  psyllium (HYDROCIL/METAMUCIL) 95 % PACK Take 1 packet by mouth daily. 04/15/22   [name], DO  senna-docusate (SENOKOT-S) 8.6-50 MG tablet Take 1 tablet by mouth 2 (two) times daily. Patient not taking: Reported on 10/29/2023 04/18/22   [name], DO  Water  For Irrigation, Sterile (FREE WATER ) SOLN Place 100 mLs into feeding tube every 6 (six) hours. 06/17/23   Arlice Chapman, MD     Family History  Problem Relation Age of Onset   Cancer Mother    Stroke Mother    Hypertension Mother    Diabetes Father    Aneurysm Father    Cancer Sister        breast ca    Social History   Socioeconomic History   Marital status: Widowed    Spouse name: Not on file   Number of children: 0   Years of education: Not on file   Highest education level: Not on file   Occupational History   Not on file  Tobacco Use   Smoking status: Former    Current packs/day: 0.00    Average packs/day: 2.0 packs/day for 42.0 years (84.0 ttl pk-yrs)    Types: Cigarettes, Cigars    Start date: 06/18/1971    Quit date: 06/17/2013    Years since quitting: 10.4   Smokeless tobacco: Never  Vaping Use   Vaping status: Never Used  Substance and Sexual Activity   Alcohol  use: Never   Drug use: No   Sexual activity: Not Currently  Other Topics Concern   Not on file  Social History Narrative   Not on file   Social Drivers of Health   Financial Resource Strain: High Risk (06/15/2023)   Overall Financial Resource Strain (CARDIA)    Difficulty of Paying Living Expenses: Hard  Food Insecurity: Food Insecurity Present (06/19/2023)   Hunger Vital Sign    Worried About Running Out of Food in the Last Year: Sometimes true    Ran Out of Food in the Last Year: Sometimes true  Transportation Needs: Unmet Transportation Needs (06/11/2023)   PRAPARE - Administrator, Civil Service (Medical): Yes    Lack of Transportation (Non-Medical): Yes  Physical Activity: Not on file  Stress: Not on file  Social Connections: Socially Isolated (06/11/2023)   Social Connection and Isolation Panel    Frequency of Communication with Friends and Family: More than three times a week    Frequency of Social Gatherings with Friends and Family: More than three times a week    Attends Religious Services: Never    Database administrator or Organizations: No    Attends Banker Meetings: Never    Marital Status: Widowed       Review of Systems: denies fever,HA,CP,dyspnea, cough, back pain,N/V or bleeding; has some mild abd soreness  Vital Signs: Vitals:   11/24/23 1110  BP: (!) 180/98  Pulse: 67  Resp: 17  Temp: 97.9 F (36.6 C)  SpO2: 96%      Advance Care Plan: no documents on file    Physical Exam: awake/alert; chest- sl dim BS bases; heart- RRR;  abd-soft,+BS, some mild diffuse tenderness to palpation; G tube intact; bilat pretibial edema 1+  Imaging: No results found.  Labs:  CBC: Recent Labs    08/07/23 1159 08/20/23 1218 09/17/23 0956 10/29/23 0836  WBC 2.6* 4.2  4.9 5.7  HGB 10.4* 11.3* 12.1* 13.4  HCT 30.2* 33.6* 35.7* 38.3*  PLT 288 259 164 203    COAGS: Recent Labs    06/12/23 0505  INR 1.2    BMP: Recent Labs    08/07/23 1159 08/20/23 1218 09/17/23 0956 10/29/23 0836  NA 139 140 141 141  K 3.8 4.0 4.1 3.9  CL 105 105 106 104  CO2 30 32 32 35*  GLUCOSE 153* 133* 83 107*  BUN 14 12 9 8   CALCIUM  8.8* 9.1 9.0 9.0  CREATININE 0.67 0.71 0.68 0.69  GFRNONAA >60 >60 >60 >60    LIVER FUNCTION TESTS: Recent Labs    08/07/23 1159 08/20/23 1218 09/17/23 0956 10/29/23 0836  BILITOT 0.8 0.8 0.8 0.8  AST 16 17 17 16   ALT 11 12 12 14   ALKPHOS 116 106 110 138*  PROT 6.7 6.5 6.5 6.9  ALBUMIN 3.7 3.7 3.7 4.0    TUMOR MARKERS: No results for input(s): AFPTM, CEA, CA199, CHROMGRNA in the last 8760 hours.  Assessment and Plan: Esophageal squamous cell carcinoma, upper third- Repeat PET scan 10/2023 revealed possible residual disease per oncology.  Request for image guided port-a-cath placement by Interventional Radiology for oncology immunotherapy treatment plan.    Risks and benefits of image guided port-a-catheter placement was discussed with the patient including, but not limited to bleeding, infection, pneumothorax, or fibrin sheath development and need for additional procedures.  All of the patient's questions were answered, patient is agreeable to proceed. Consent signed and in chart.            Thank you for allowing our service to participate in Melvin Hudson 's care.    Electronically Signed: Kristi B Davenport, NP  Melvin Hudson 11/23/2023, 3:37 PM     I spent a total of 20 minutes    in face to face in clinical consultation, greater than 50% of which was  counseling/coordinating care for port a cath placement   (A copy of this note was sent to the referring provider and the time of visit.)

## 2023-11-23 NOTE — Progress Notes (Signed)
 Pharmacist Chemotherapy Monitoring - Initial Assessment    Anticipated start date: 11/30/23   The following has been reviewed per standard work regarding the patient's treatment regimen: The patient's diagnosis, treatment plan and drug doses, and organ/hematologic function Lab orders and baseline tests specific to treatment regimen  The treatment plan start date, drug sequencing, and pre-medications Prior authorization status  Patient's documented medication list, including drug-drug interaction screen and prescriptions for anti-emetics and supportive care specific to the treatment regimen The drug concentrations, fluid compatibility, administration routes, and timing of the medications to be used The patient's access for treatment and lifetime cumulative dose history, if applicable  The patient's medication allergies and previous infusion related reactions, if applicable   Changes made to treatment plan:  N/A  Follow up needed:  N/A   Charniece Venturino, Pharm.D., CPP 11/23/2023@12 :59 PM

## 2023-11-24 ENCOUNTER — Other Ambulatory Visit: Payer: Self-pay

## 2023-11-24 ENCOUNTER — Inpatient Hospital Stay (HOSPITAL_COMMUNITY)
Admission: RE | Admit: 2023-11-24 | Discharge: 2023-11-24 | Disposition: A | Source: Ambulatory Visit | Attending: Hematology

## 2023-11-24 ENCOUNTER — Encounter (HOSPITAL_COMMUNITY): Payer: Self-pay

## 2023-11-24 ENCOUNTER — Ambulatory Visit (HOSPITAL_COMMUNITY)
Admission: RE | Admit: 2023-11-24 | Discharge: 2023-11-24 | Disposition: A | Discharge status: Home / Self Care | Source: Ambulatory Visit | Attending: Hematology | Admitting: Hematology

## 2023-11-24 DIAGNOSIS — Z5941 Food insecurity: Secondary | ICD-10-CM | POA: Insufficient documentation

## 2023-11-24 DIAGNOSIS — C153 Malignant neoplasm of upper third of esophagus: Secondary | ICD-10-CM | POA: Insufficient documentation

## 2023-11-24 DIAGNOSIS — Z604 Social exclusion and rejection: Secondary | ICD-10-CM | POA: Insufficient documentation

## 2023-11-24 DIAGNOSIS — Z5982 Transportation insecurity: Secondary | ICD-10-CM | POA: Insufficient documentation

## 2023-11-24 DIAGNOSIS — Z87891 Personal history of nicotine dependence: Secondary | ICD-10-CM | POA: Diagnosis not present

## 2023-11-24 DIAGNOSIS — Z59868 Other specified financial insecurity: Secondary | ICD-10-CM | POA: Insufficient documentation

## 2023-11-24 HISTORY — PX: IR IMAGING GUIDED PORT INSERTION: IMG5740

## 2023-11-24 LAB — GLUCOSE, CAPILLARY: Glucose-Capillary: 102 mg/dL — ABNORMAL HIGH (ref 70–99)

## 2023-11-24 MED ORDER — FENTANYL CITRATE (PF) 100 MCG/2ML IJ SOLN
INTRAMUSCULAR | Status: AC | PRN
  Administered 2023-11-24 (×2): 50 ug via INTRAVENOUS

## 2023-11-24 MED ORDER — HEPARIN SOD (PORK) LOCK FLUSH 100 UNIT/ML IV SOLN
INTRAVENOUS | Status: AC
  Filled 2023-11-24: qty 5

## 2023-11-24 MED ORDER — SODIUM CHLORIDE 0.9 % IV SOLN: INTRAVENOUS | Status: DC

## 2023-11-24 MED ORDER — MIDAZOLAM HCL 2 MG/2ML IJ SOLN
INTRAMUSCULAR | Status: AC
  Filled 2023-11-24: qty 2

## 2023-11-24 MED ORDER — FENTANYL CITRATE (PF) 100 MCG/2ML IJ SOLN
INTRAMUSCULAR | Status: AC
  Filled 2023-11-24: qty 2

## 2023-11-24 MED ORDER — LIDOCAINE-EPINEPHRINE 1 %-1:100000 IJ SOLN
INTRAMUSCULAR | Status: AC
  Filled 2023-11-24: qty 1

## 2023-11-24 MED ORDER — MIDAZOLAM HCL 2 MG/2ML IJ SOLN
INTRAMUSCULAR | Status: AC | PRN
  Administered 2023-11-24 (×2): 1 mg via INTRAVENOUS

## 2023-11-24 MED ORDER — HEPARIN SOD (PORK) LOCK FLUSH 100 UNIT/ML IV SOLN
500.0000 [IU] | Freq: Once | INTRAVENOUS | Status: AC
  Administered 2023-11-24: 500 [IU] via INTRAVENOUS

## 2023-11-24 MED ORDER — LIDOCAINE-EPINEPHRINE 1 %-1:100000 IJ SOLN
20.0000 mL | Freq: Once | INTRAMUSCULAR | Status: AC
  Administered 2023-11-24: 20 mL via INTRADERMAL

## 2023-11-24 NOTE — Assessment & Plan Note (Addendum)
-  cTxN1M0, invasive squamous cell carcinoma -Present with progressive dysphagia since December 2024. -EGD on 05/27/2023 which showed a mass in the proximal esophagus, 2 to 3 cm below introitus.  Biopsy confirmed invasive squamous cell carcinoma.  -PET scan showed hypermetabolic lesion in the proximal esophagus, with a positive peritracheal lymph node.  No distant metastasis -Status post PEG feeding tube placement  -He started chemoRT on 06/24/2023 and completed on 08/03/23 -ED visit for chest pain on 06/29/2023 -repeated upper EGD and biopsy was negative for residual tumor on 10/30/2023 - Given the significant residual hypermetabolic activity on the PET scan and his high risk for recurrence, I recommend nivolumab for 1 year.

## 2023-11-24 NOTE — Discharge Instructions (Signed)

## 2023-11-24 NOTE — Procedures (Signed)
 Interventional Radiology Procedure Note  Procedure: Placement of a right IJ approach Bard Clearvue single lumen PowerPort.  Tip is positioned at the superior cavoatrial junction and catheter is ready for immediate use.  Complications: No immediate Recommendations:  - Ok to shower tomorrow - Do not submerge for 7 days - Routine line care   Signed,  Wilkie LOIS Lent, MD

## 2023-11-25 ENCOUNTER — Inpatient Hospital Stay: Attending: Hematology | Admitting: Hematology

## 2023-11-25 VITALS — BP 120/78 | HR 90 | Temp 98.5°F | Resp 16 | Ht 74.0 in | Wt 257.7 lb

## 2023-11-25 DIAGNOSIS — Z9221 Personal history of antineoplastic chemotherapy: Secondary | ICD-10-CM | POA: Diagnosis not present

## 2023-11-25 DIAGNOSIS — Z79899 Other long term (current) drug therapy: Secondary | ICD-10-CM | POA: Diagnosis not present

## 2023-11-25 DIAGNOSIS — M542 Cervicalgia: Secondary | ICD-10-CM | POA: Diagnosis not present

## 2023-11-25 DIAGNOSIS — C153 Malignant neoplasm of upper third of esophagus: Secondary | ICD-10-CM | POA: Insufficient documentation

## 2023-11-25 DIAGNOSIS — Z85818 Personal history of malignant neoplasm of other sites of lip, oral cavity, and pharynx: Secondary | ICD-10-CM | POA: Diagnosis not present

## 2023-11-25 DIAGNOSIS — Z7901 Long term (current) use of anticoagulants: Secondary | ICD-10-CM | POA: Insufficient documentation

## 2023-11-25 DIAGNOSIS — R634 Abnormal weight loss: Secondary | ICD-10-CM | POA: Insufficient documentation

## 2023-11-25 DIAGNOSIS — Z86718 Personal history of other venous thrombosis and embolism: Secondary | ICD-10-CM | POA: Diagnosis not present

## 2023-11-25 DIAGNOSIS — Z923 Personal history of irradiation: Secondary | ICD-10-CM | POA: Diagnosis not present

## 2023-11-25 DIAGNOSIS — Z931 Gastrostomy status: Secondary | ICD-10-CM | POA: Insufficient documentation

## 2023-11-25 MED ORDER — NYSTATIN 100000 UNIT/ML MT SUSP: 5.0000 mL | Freq: Four times a day (QID) | OROMUCOSAL | 2 refills | Status: DC

## 2023-11-25 NOTE — Progress Notes (Signed)
 [facility]   [phone] [phone]   Clinic Follow up Note   Patient Care Team: Haze Kingfisher, MD as PCP - General (Family Medicine) Michele Richardson, DO as PCP - Cardiology (Cardiology) Clance, Francis HERO, MD as Referring Physician (Pulmonary Disease) Lonn Hicks, MD as Consulting Physician (Hematology and Oncology) [name], MD as Attending Physician (Radiation Oncology) [name], MD as Consulting Physician (Hematology and Oncology)  Date of Service:  11/25/2023  CHIEF COMPLAINT: f/u of upper esophageal cancer  CURRENT THERAPY:  Pending nivolumab every 4 weeks  Oncology History   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 -EGD on 05/27/2023 which showed a mass in the proximal esophagus, 2 to 3 cm below introitus.  Biopsy confirmed invasive squamous cell carcinoma.  -PET scan showed hypermetabolic lesion in the proximal esophagus, with a positive peritracheal lymph node.  No distant metastasis -Status post PEG feeding tube placement  -He started chemoRT on 06/24/2023 and completed on 08/03/23 -ED visit for chest pain on 06/29/2023 -repeated upper EGD and biopsy was negative for residual tumor on 10/30/2023 - Given the significant residual hypermetabolic activity on the PET scan and his high risk for recurrence, I recommend nivolumab for 1 year.  Assessment & Plan Esophageal squamous cell carcinoma, upper third Currently on active immunotherapy with a good response to prior chemoradiation. PET scan indicates possible residual cancer cells. Immunotherapy aims to boost the immune system to target these cells. Treatment is expected to last for one year with infusions every four weeks. Common side effects include mild fatigue, thyroid  dysfunction, skin rash, and joint pain. Serious side effects can include pneumonitis, liver dysfunction, and  colitis. The goal is remission, though recurrence is possible. - Administer immunotherapy infusion every four weeks for one year. - Monitor thyroid  function, liver function, kidney function, and blood counts monthly. - Repeat PET scan or CT scan every three to six months. - Coordinate with Dr. Jesus for potential esophageal dilation and endoscopy.  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 - Follow up with Dr. Jesus on October 16 for post-op visit and potential esophageal dilation.  Weight loss related to decreased oral intake and feeding tube dependence Weight loss of more than ten pounds since early September due to decreased oral intake and feeding tube dependence. - Increase feeding tube nutrition to prevent further weight loss.  Neck pain, likely musculoskeletal Neck pain likely musculoskeletal. No leg swelling reported. - Use heating pad or massage for neck pain relief.   Plan - I reviewed the benefit and schedule of Nivolumab infusion, and the potential side effects, he agrees to proceed.  He is scheduled to start next Monday. - I spoke with his niece on the phone about the above, and answered her questions - Follow-up in 5 weeks before the second cycle of nivolumab.  Patient knows to call me if he experience any significant side effect after first infusion.  SUMMARY OF ONCOLOGIC HISTORY: Oncology History  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HCC)  06/14/2013 Procedure   Biopsy of the tonsil confirmrf squamous cell carcinoma. 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  History of Present Illness Melvin Hudson is a 68 year old male with esophageal squamous cell carcinoma who presents for follow-up.  He recently had a port placed for treatment, which is currently sore and covered. He has not attended a chemo class as this treatment is immunotherapy.  He experiences ongoing swelling and difficulty eating since a biopsy, relying on a feeding tube for nutrition. He has increased feeding tube use due to weight loss, having lost more than ten pounds since early September.  His mouth appears black, which is unusual, though it was not black the previous day. He brushes his tongue regularly and suspects it might be related to coffee. He is running low on nystatin  mouthwash.  He experiences neck pain, which he attributes to a change in sleeping position, using a heat massage device for relief. No swelling in his legs.     All other systems were reviewed with the patient and are negative.  MEDICAL HISTORY:  Past Medical History:  Diagnosis Date   Angioedema 09/17/2011   tongue and lips   Arthritis    both of my legs and feet (01/05/2014)   CHF (congestive heart failure) (HCC)    Diabetes mellitus without complication (HCC)    DVT (deep venous thrombosis) (HCC) 06/16/2023   LLE    Erectile dysfunction 04/20/2018   GI bleed    Heart murmur 04/20/2018   History of radiation therapy    06/24/2023- 08/03/2023 Dr. 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    SURGICAL HISTORY: Past Surgical History:  Procedure Laterality Date   COLONOSCOPY Left 04/11/2014   Procedure: COLONOSCOPY;  Surgeon: Elsie Cree, MD;  Location: [facility] ENDOSCOPY;  Service: Endoscopy;  Laterality: Left;   COLONOSCOPY N/A 04/20/2014   Procedure: COLONOSCOPY;  Surgeon: Jerrell KYM Sol, MD;  Location: [facility] ENDOSCOPY;  Service: Endoscopy;  Laterality: N/A;   COLONOSCOPY W/ BIOPSIES AND POLYPECTOMY     benign   DIRECT LARYNGOSCOPY  01/05/2014   DIRECT LARYNGOSCOPY N/A 01/05/2014   Procedure: DIRECT LARYNGOSCOPY AND BIOPSY;  Surgeon: Ida Loader, MD;  Location: [facility] OR;  Service: ENT;  Laterality: N/A;   ESOPHAGOSCOPY WITH DILITATION N/A 05/27/2023   Procedure: ESOPHAGOSCOPY;  Surgeon: Loader Ida, MD;  Location: [facility] OR;  Service: ENT;  Laterality: N/A;   ESOPHAGOSCOPY WITH DILITATION N/A 10/30/2023   Procedure: ESOPHAGOSCOPY, WITH BIOPSY;  Surgeon: Loader Ida, MD;  Location: [facility] OR;  Service: ENT;  Laterality: N/A;  With possible biopsy   IR GASTROSTOMY TUBE MOD SED  06/12/2023   IR IMAGING GUIDED PORT INSERTION  11/24/2023   IR RADIOLOGIST EVAL & MGMT  08/10/2023   IR RADIOLOGIST EVAL & MGMT  08/14/2023   KNEE ARTHROSCOPY Right ~ 1994   LEFT AND RIGHT HEART CATHETERIZATION WITH CORONARY/GRAFT ANGIOGRAM N/A 12/31/2010   Procedure: LEFT AND RIGHT HEART CATHETERIZATION WITH EL BILE;  Surgeon: Erick JONELLE Bergamo, MD;  Location: [facility] CATH LAB;  Service: Cardiovascular;  Laterality: N/A;   MULTIPLE EXTRACTIONS WITH ALVEOLOPLASTY N/A 07/08/2013   Procedure: Extraction of tooth #'s 22, 27  with alveoloplasty and bilateal mandibular facial exostoses reductions;  Surgeon: Tanda JULIANNA Fanny, DDS;  Location: [facility] ORS;  Service: Oral Surgery;  Laterality: N/A;   MULTIPLE TOOTH EXTRACTIONS  ~ 2008    I have reviewed the social history and family history with the patient and they are unchanged from previous note.  ALLERGIES:  is allergic to azilsartan and advil [ibuprofen].  MEDICATIONS:  Current Outpatient Medications  Medication Sig Dispense Refill   albuterol  (VENTOLIN  HFA) 108 (90 Base) MCG/ACT inhaler Inhale 2 puffs into the lungs every 4 (four) hours as needed for wheezing (Cough).     allopurinol  (ZYLOPRIM ) 300 MG tablet Take 300 mg by mouth every other day.     APIXABAN  (ELIQUIS ) VTE STARTER PACK (10MG  AND 5MG ) Take as directed on package: start with two-5mg  tablets twice daily for 7 days. On day 8, switch to one-5mg  tablet twice daily. 74 each 0   atorvastatin  (LIPITOR) 40 MG tablet Take 1 tablet (40 mg total) by mouth at bedtime. 90 tablet 1   bismuth subsalicylate (PEPTO BISMOL) 262 MG/15ML suspension Take 30 mLs by mouth every 6 (six) hours as needed for indigestion or diarrhea or loose stools.     Blood Glucose Monitoring Suppl (ONETOUCH VERIO FLEX SYSTEM) w/Device KIT USE TO CHECK BLOOD SUGAR TWICE DAILY     calcium  carbonate (TUMS - DOSED IN MG ELEMENTAL CALCIUM ) 500 MG chewable tablet Chew 2 tablets (400 mg of elemental calcium  total) by mouth 3 (three) times daily with meals. 180 tablet 1   carvedilol  (COREG ) 3.125 MG tablet Take 3.125 mg by mouth 2 (two) times daily with a meal.     clonazePAM  (KLONOPIN ) 1 MG disintegrating tablet Take 1 mg by mouth daily.     dapagliflozin  propanediol (FARXIGA ) 10 MG TABS tablet Take 10 mg by mouth daily.     diphenhydrAMINE  (BENADRYL ) 25 MG tablet Take 1 tablet (25 mg total) by mouth every 6 (six) hours. 20 tablet 0   ELIQUIS  5 MG TABS tablet TAKE 1 TABLET(5 MG) BY MOUTH TWICE DAILY 60 tablet 1   famotidine  (PEPCID ) 40 MG/5ML  suspension PLACE 2.5 MILLILITERS INTO THE FEEDING TUBE DAILY 50 mL 1   gabapentin  (NEURONTIN ) 300 MG capsule Take 300 mg by mouth 3 (three) times daily.   2   HYDROcodone -acetaminophen  (HYCET) 7.5-325 mg/15 ml solution Take 15 mLs by mouth 4 (four) times daily as needed for moderate pain (pain score 4-6). 473 mL 0   hydrocortisone  (ANUSOL -HC) 2.5 % rectal cream Place 1 Application rectally daily as needed for hemorrhoids or anal itching.     isosorbide -hydrALAZINE  (BIDIL ) 20-37.5 MG tablet Take 1 tablet by mouth in the morning and at bedtime. 90 tablet 3   lactose free nutrition (BOOST) LIQD Take 237 mLs by mouth daily.     lidocaine  (XYLOCAINE ) 2 % solution MIX 1 PART(5ML) OF LIDOCAINE  AND 1 PART WATER ( ). SWALLOW  OF DILUTED MIXTURE 30 MINUTES BEFORE MEALS AND BEDTIME FOR UP TO FOUR TIMES DAILY 200 mL 3   LORazepam  (ATIVAN ) 0.5 MG tablet Take 1 tablet PRN claustrophobia 20 minutes prior to radiation. Will need a driver. Use only as needed. (Patient not taking: Reported on 09/01/2023) 5 tablet 0   nystatin  (MYCOSTATIN ) 100000 UNIT/ML suspension Take 10 mLs (1,000,000 Units total) by mouth 4 (four) times daily. For oral thrush. Stop when thrush resolves 473 mL 0   nystatin  (MYCOSTATIN ) 100000 UNIT/ML suspension Take 5 mLs (500,000 Units total) by mouth 4 (four) times daily. 473 mL 2   omeprazole  (PRILOSEC) 20 MG capsule Take 20 mg by mouth daily.  (Patient not taking: Reported on 10/29/2023)  4   ONETOUCH VERIO test strip USE TO CHECK BLOOD SUGAR TWICE DAILY     polyethylene glycol (MIRALAX  / GLYCOLAX ) 17 g packet Take 17 g by mouth daily. 14 each 0   Protein (FEEDING SUPPLEMENT, PROSOURCE TF20,) liquid Place 60 mLs into feeding tube 2 (two) times daily.     psyllium (HYDROCIL/METAMUCIL) 95 % PACK Take 1 packet by mouth daily. 240 each 0   senna-docusate (SENOKOT-S) 8.6-50 MG tablet Take 1 tablet by mouth 2 (two) times daily. 60 tablet 1   Water  For Irrigation, Sterile (FREE WATER ) SOLN Place  100 mLs into feeding tube every 6 (six) hours.     No current facility-administered medications for this visit.    PHYSICAL EXAMINATION: ECOG PERFORMANCE STATUS: 1 - Symptomatic but completely ambulatory  Vitals:   11/25/23 0825  BP: 120/78  Pulse: 90  Resp: 16  Temp: 98.5 F (36.9 C)  SpO2: 98%   Wt Readings from Last 3 Encounters:  11/25/23 257 lb 11.2 oz (116.9 kg)  11/24/23 265 lb (120.2 kg)  10/30/23 268 lb (121.6 kg)     GENERAL:alert, no distress and comfortable SKIN: skin color, texture, turgor are normal, no rashes or significant lesions EYES: normal, Conjunctiva are pink and non-injected, sclera clear NECK: supple, thyroid  normal size, non-tender, without nodularity LYMPH:  no palpable lymphadenopathy in the cervical, axillary  LUNGS: clear to auscultation and percussion with normal breathing effort HEART: regular rate & rhythm and no murmurs and no lower extremity edema ABDOMEN:abdomen soft, non-tender and normal bowel sounds Musculoskeletal:no cyanosis of digits and no clubbing  NEURO: alert & oriented x 3 with fluent speech, no focal motor/sensory deficits  Physical Exam    LABORATORY DATA:  I have reviewed the data as listed    Latest Ref Rng & Units 10/29/2023    8:36 AM 09/17/2023    9:56 AM 08/20/2023   12:18 PM  CBC  WBC 4.0 - 10.5 K/uL 5.7  4.9  4.2   Hemoglobin 13.0 - 17.0 g/dL 86.5  87.8  88.6   Hematocrit 39.0 - 52.0 % 38.3  35.7  33.6   Platelets 150 - 400 K/uL 203  164  259         Latest Ref Rng & Units 10/29/2023    8:36 AM 09/17/2023    9:56 AM 08/20/2023   12:18 PM  CMP  Glucose 70 - 99 mg/dL 892  83  866   BUN 8 - 23 mg/dL 8  9  12    Creatinine 0.61 - 1.24 mg/dL 9.30  9.31  9.28   Sodium 135 - 145 mmol/L 141  141  140   Potassium 3.5 - 5.1 mmol/L 3.9  4.1  4.0   Chloride 98 - 111 mmol/L 104  106  105   CO2 22 - 32 mmol/L 35  32  32   Calcium  8.9 - 10.3 mg/dL 9.0  9.0  9.1   Total Protein 6.5 - 8.1 g/dL 6.9  6.5  6.5   Total  Bilirubin 0.0 - 1.2 mg/dL 0.8  0.8  0.8   Alkaline Phos 38 - 126 U/L 138  110  106   AST 15 - 41 U/L 16  17  17    ALT 0 - 44 U/L 14  12  12        RADIOGRAPHIC STUDIES: I have personally reviewed the radiological images as listed and agreed with the findings in the report. IR IMAGING GUIDED PORT INSERTION Result Date: 11/24/2023 INDICATION: Esophageal cancer.  Requires durable venous access for chemotherapy. EXAM: IMPLANTED PORT A CATH PLACEMENT WITH ULTRASOUND AND FLUOROSCOPIC GUIDANCE MEDICATIONS: None ANESTHESIA/SEDATION: Versed  2 mg IV; Fentanyl  100 mcg IV; administered by the radiology nurse Moderate Sedation Time:  16 minutes The patient's vital signs and level of consciousness were continuously monitored during the procedure by the interventional radiology nurse under my direct supervision. FLUOROSCOPY: Radiation exposure index: 7 mGy, air kerma COMPLICATIONS: None immediate. PROCEDURE: The right neck and chest was prepped with chlorhexidine , and draped in the usual sterile fashion using maximum barrier technique (cap and mask, sterile gown, sterile gloves, large sterile sheet, hand hygiene and cutaneous antiseptic). Local anesthesia was attained by infiltration with 1% lidocaine  with epinephrine . Ultrasound demonstrated patency of the right internal jugular vein, and this was documented with an image. Under real-time ultrasound guidance, this vein was accessed with a 21 gauge micropuncture needle and image documentation was performed. A small dermatotomy was made at the access site with an 11 scalpel. A 0.018 wire was advanced into the SVC and the access needle exchanged for a 40F micropuncture vascular sheath. The 0.018 wire was then removed and a 0.035 wire advanced into the IVC. An appropriate location for the subcutaneous reservoir was selected below the clavicle and an incision was made through the skin and underlying soft tissues. The subcutaneous tissues were then dissected using a  combination of blunt and sharp surgical technique and a pocket was formed. A Bard Clear Vue single lumen power injectable portacatheter was then tunneled through the subcutaneous tissues from the pocket to the dermatotomy and the port reservoir placed within the subcutaneous pocket. The venous access site was then serially dilated and a peel away vascular sheath placed over the wire. The wire was removed and the port catheter advanced into position under fluoroscopic guidance. The catheter tip is positioned in the superior cavoatrial junction. This was documented with a spot image. The portacatheter was then tested and found to flush and aspirate well. The port was flushed with saline followed by 100 units/mL heparinized saline. The pocket was then closed in two layers using first subdermal inverted interrupted absorbable sutures followed by a running subcuticular suture. The epidermis was then sealed with Dermabond. The dermatotomy at the venous access site was also closed with Dermabond. IMPRESSION: Successful placement of a right IJ approach Bard Clear Vue single-lumen power Port with ultrasound and fluoroscopic guidance. The catheter is ready for use. Electronically Signed   By: Wilkie Lent M.D.   On: 11/24/2023 14:15      No orders of the defined types were placed in this encounter.  All questions were answered. The patient knows to call the clinic with any problems, questions or concerns. No barriers to learning was detected. The total time  spent in the appointment was 30 minutes, including review of chart and various tests results, discussions about plan of care and coordination of care plan     Onita Mattock, MD 11/25/2023

## 2023-11-30 ENCOUNTER — Inpatient Hospital Stay

## 2023-11-30 ENCOUNTER — Inpatient Hospital Stay: Admitting: Hematology

## 2023-11-30 ENCOUNTER — Other Ambulatory Visit: Payer: Self-pay | Admitting: *Deleted

## 2023-11-30 ENCOUNTER — Telehealth: Payer: Self-pay | Admitting: *Deleted

## 2023-11-30 ENCOUNTER — Ambulatory Visit: Admitting: Hematology

## 2023-11-30 VITALS — BP 147/90 | HR 70 | Temp 98.1°F | Resp 16 | Wt 258.0 lb

## 2023-11-30 DIAGNOSIS — C153 Malignant neoplasm of upper third of esophagus: Secondary | ICD-10-CM

## 2023-11-30 LAB — CMP (CANCER CENTER ONLY)
ALT: 9 U/L (ref 0–44)
AST: 15 U/L (ref 15–41)
Albumin: 4 g/dL (ref 3.5–5.0)
Alkaline Phosphatase: 138 U/L — ABNORMAL HIGH (ref 38–126)
Anion gap: 3 — ABNORMAL LOW (ref 5–15)
BUN: 9 mg/dL (ref 8–23)
CO2: 32 mmol/L (ref 22–32)
Calcium: 9.4 mg/dL (ref 8.9–10.3)
Chloride: 104 mmol/L (ref 98–111)
Creatinine: 0.62 mg/dL (ref 0.61–1.24)
GFR, Estimated: >60 mL/min (ref >60)
Glucose, Bld: 91 mg/dL (ref 70–99)
Potassium: 3.9 mmol/L (ref 3.5–5.1)
Sodium: 139 mmol/L (ref 135–145)
Total Bilirubin: 1 mg/dL (ref 0.0–1.2)
Total Protein: 7.1 g/dL (ref 6.5–8.1)

## 2023-11-30 LAB — CBC WITH DIFFERENTIAL (CANCER CENTER ONLY)
Abs Immature Granulocytes: 0.03 K/uL (ref 0.00–0.07)
Basophils Absolute: 0 K/uL (ref 0.0–0.1)
Basophils Relative: 1 %
Eosinophils Absolute: 0.4 K/uL (ref 0.0–0.5)
Eosinophils Relative: 7 %
HCT: 34.9 % — ABNORMAL LOW (ref 39.0–52.0)
Hemoglobin: 12.3 g/dL — ABNORMAL LOW (ref 13.0–17.0)
Immature Granulocytes: 1 %
Lymphocytes Relative: 14 %
Lymphs Abs: 0.7 K/uL (ref 0.7–4.0)
MCH: 28.3 pg (ref 26.0–34.0)
MCHC: 35.2 g/dL (ref 30.0–36.0)
MCV: 80.4 fL (ref 80.0–100.0)
Monocytes Absolute: 0.6 K/uL (ref 0.1–1.0)
Monocytes Relative: 12 %
Neutro Abs: 3.4 K/uL (ref 1.7–7.7)
Neutrophils Relative %: 65 %
Platelet Count: 235 K/uL (ref 150–400)
RBC: 4.34 MIL/uL (ref 4.22–5.81)
RDW: 16.8 % — ABNORMAL HIGH (ref 11.5–15.5)
WBC Count: 5.2 K/uL (ref 4.0–10.5)
nRBC: 0 % (ref 0.0–0.2)

## 2023-11-30 LAB — TSH: TSH: 2.16 u[IU]/mL (ref 0.350–4.500)

## 2023-11-30 MED ORDER — LIDOCAINE-PRILOCAINE 2.5-2.5 % EX CREA: 1.0000 | TOPICAL_CREAM | CUTANEOUS | 1 refills | Status: DC | PRN

## 2023-11-30 MED ORDER — PROCHLORPERAZINE MALEATE 10 MG PO TABS: 10.0000 mg | ORAL_TABLET | Freq: Four times a day (QID) | ORAL | 2 refills | Status: AC | PRN

## 2023-11-30 MED ORDER — ONDANSETRON HCL 8 MG PO TABS: 8.0000 mg | ORAL_TABLET | Freq: Three times a day (TID) | ORAL | 2 refills | Status: AC | PRN

## 2023-11-30 MED ORDER — SODIUM CHLORIDE 0.9 % IV SOLN: INTRAVENOUS | Status: DC

## 2023-11-30 MED ORDER — SODIUM CHLORIDE 0.9% FLUSH: 10.0000 mL | INTRAVENOUS | Status: DC | PRN

## 2023-11-30 MED ORDER — SODIUM CHLORIDE 0.9 % IV SOLN
480.0000 mg | Freq: Once | INTRAVENOUS | Status: AC
  Administered 2023-11-30: 480 mg via INTRAVENOUS
  Filled 2023-11-30: qty 48

## 2023-11-30 NOTE — Patient Instructions (Signed)
 Implanted [facility] Guide An implanted port is a device that is placed under the skin. It is usually placed in the chest. The device may vary based on the need. Implanted ports can be used to give IV medicine, to take blood, or to give fluids. You may have an implanted port if: You need IV medicine that would be irritating to the small veins in your hands or arms. You need IV medicines, such as chemotherapy, for a long period of time. You need IV nutrition for a long period of time. You may have fewer limitations when using a port than you would if you used other types of long-term IVs. You will also likely be able to return to normal activities after your incision heals. An implanted port has two main parts: Reservoir. The reservoir is the part where a needle is inserted to give medicines or draw blood. The reservoir is round. After the port is placed, it appears as a small, raised area under your skin. Catheter. The catheter is a small, thin tube that connects the reservoir to a vein. Medicine that is inserted into the reservoir goes into the catheter and then into the vein. How is my port accessed? To access your port: A numbing cream may be placed on the skin over the port site. Your health care provider will put on a mask and sterile gloves. The skin over your port will be cleaned carefully with a germ-killing soap and allowed to dry. Your health care provider will gently pinch the port and insert a needle into it. Your health care provider will check for a blood return to make sure the port is in the vein and is still working (patent). If your port needs to remain accessed to get medicine continuously (constant infusion), your health care provider will place a clear bandage (dressing) over the needle site. The dressing and needle will need to be changed every week, or as told by your health care provider. What is flushing? Flushing helps keep the port working. Follow instructions from your  health care provider about how and when to flush the port. Ports are usually flushed with saline solution or a medicine called heparin . The need for flushing will depend on how the port is used: If the port is only used from time to time to give medicines or draw blood, the port may need to be flushed: Before and after medicines have been given. Before and after blood has been drawn. As part of routine maintenance. Flushing may be recommended every 4-6 weeks. If a constant infusion is running, the port may not need to be flushed. Throw away any syringes in a disposal container that is meant for sharp items (sharps container). You can buy a sharps container from a pharmacy, or you can make one by using an empty hard plastic bottle with a cover. How long will my port stay implanted? The port can stay in for as long as your health care provider thinks it is needed. When it is time for the port to come out, a surgery will be done to remove it. The surgery will be similar to the procedure that was done to put the port in. Follow these instructions at home: Caring for your port and port site Flush your port as told by your health care provider. If you need an infusion over several days, follow instructions from your health care provider about how to take care of your port site. Make sure you: Change your  dressing as told by your health care provider. Wash your hands with soap and water  for at least 20 seconds before and after you change your dressing. If soap and water  are not available, use alcohol -based hand sanitizer. Place any used dressings or infusion bags into a plastic bag. Throw that bag in the trash. Keep the dressing that covers the needle clean and dry. Do not get it wet. Do not use scissors or sharp objects near the infusion tubing. Keep any external tubes clamped, unless they are being used. Check your port site every day for signs of infection. Check for: Redness, swelling, or  pain. Fluid or blood. Warmth. Pus or a bad smell. Protect the skin around the port site. Avoid wearing bra straps that rub or irritate the site. Protect the skin around your port from seat belts. Place a soft pad over your chest if needed. Bathe or shower as told by your health care provider. The site may get wet as long as you are not actively receiving an infusion. General instructions  Return to your normal activities as told by your health care provider. Ask your health care provider what activities are safe for you. Carry a medical alert card or wear a medical alert bracelet at all times. This will let health care providers know that you have an implanted port in case of an emergency. Where to find more information American Cancer Society: www.cancer.org American Society of Clinical Oncology: www.cancer.net Contact a health care provider if: You have a fever or chills. You have redness, swelling, or pain at the port site. You have fluid or blood coming from your port site. Your incision feels warm to the touch. You have pus or a bad smell coming from the port site. Summary Implanted ports are usually placed in the chest for long-term IV access. Follow instructions from your health care provider about flushing the port and changing bandages (dressings). Take care of the area around your port by avoiding clothing that puts pressure on the area, and by watching for signs of infection. Protect the skin around your port from seat belts. Place a soft pad over your chest if needed. Contact a health care provider if you have a fever or you have redness, swelling, pain, fluid, or a bad smell at the port site. This information is not intended to replace advice given to you by your health care provider. Make sure you discuss any questions you have with your health care provider. Document Revised: 08/07/2020 Document Reviewed: 08/07/2020 Elsevier Patient Education  2024 Elsevier Inc.Lidocaine ;  Prilocaine Cream What is this medication? LIDOCAINE ; PRILOCAINE (LYE do kane; PRYE lo kane) prevents pain during a procedure. It numbs the area where it is applied, which blocks your nerves from sending pain signals to your brain. It belongs to a group of medications called local anesthetics. This medicine may be used for other purposes; ask your health care provider or pharmacist if you have questions. COMMON BRAND NAME(S): ANODYNE LPT, DermacinRx, DermacinRx Prizopak, EMLA, EmReal, IV Novice Pack, Leva Set, Lido-Prilo Caine, Lidotor, LiProZonePak, LIVIXIL Pak, LP Delphi, [employer] with [employer], Microvix LP, Nuvakaan, Nuvakaan-II, Port-Prep, PrepIV, PRILOHEAL PLUS 30, Prilovix, Prilovix Lite, Prilovix [employer], Prilovix Plus, Prilovix Ultralite, Prilovix [employer], Prilovixil, Prizopak-II, REAL HEAL-I, Relador, Soluline, SOLUPICC, VallaDerm-90 What should I tell my care team before I take this medication? They need to know if you have any of these conditions: G6PD deficiency Have higher than normal levels of methemoglobin Heart disease Kidney  disease Large areas of burned or damaged skin Liver disease Skin conditions or disease An unusual or allergic reaction to lidocaine , prilocaine, other medications, foods, dyes, or preservatives Pregnant or trying to get pregnant Breastfeeding How should I use this medication? Apply this medication to the affected area. Use your fingertips or cotton swabs. Wash your hands before and after use. Use it as directed on the prescription label. Do not use it more often than directed. Talk to your care team about the use of this medication in children. While it may be prescribed for children for selected conditions, precautions do apply. Overdosage: If you think you have taken too much of this medicine contact a poison control center or emergency room at once. NOTE: This medicine is only for you. Do not share this medicine with others. What if  I miss a dose? This does not apply. This medication is not for regular use. What may interact with this medication? This medicine may interact with the following medications: acetaminophen  certain antibiotics like dapsone, nitrofurantoin, aminosalicylic acid, sulfasalazine certain medicines for seizures like phenobarbital, phenytoin, valproic acid chloroquine cyclophosphamide flutamide hydroxyurea ifosfamide metoclopramide nitroglycerin  other local anesthetics like pramoxine, tetracaine primaquine quinine This list may not describe all possible interactions. Give your health care provider a list of all the medicines, herbs, non-prescription drugs, or dietary supplements you use. Also tell them if you smoke, drink alcohol , or use illegal drugs. Some items may interact with your medicine. This list may not describe all possible interactions. Give your health care provider a list of all the medicines, herbs, non-prescription drugs, or dietary supplements you use. Also tell them if you smoke, drink alcohol , or use illegal drugs. Some items may interact with your medicine. What should I watch for while using this medication? Visit your care team for regular checks on your progress. Tell your care team if your symptoms do not start to get better or if they get worse. Be careful to avoid injury while the area is numb, and you are not aware of pain. What side effects may I notice from receiving this medication? Side effects that you should report to your care team as soon as possible: Allergic reactions--skin rash, itching, hives, swelling of the face, lips, tongue, or throat Headache, unusual weakness or fatigue, shortness of breath, nausea, vomiting, rapid heartbeat, blue skin or lips, which may be signs of methemoglobinemia Heart rhythm changes--fast or irregular heartbeat, dizziness, feeling faint or lightheaded, chest pain, trouble breathing Side effects that usually do not require medical  attention (report these to your care team if they continue or are bothersome): Change in skin color Irritation at application site This list may not describe all possible side effects. Call your doctor for medical advice about side effects. You may report side effects to FDA at 1-800-FDA-1088. Where should I keep my medication? Keep out of the reach of children and pets. Store at room temperature between 20 and 25 degrees C (68 and 77 degrees F). Get rid of any unused medication after the expiration date. To get rid of medications that are no longer needed or have expired: Take the medication to a medication take-back program. Check with your pharmacy or law enforcement to find a location. If you cannot return the medication, check the label or package insert to see if the medication should be thrown out in the garbage or flushed down the toilet. If you are not sure, ask your care team. If it is safe to put it in the  trash, empty the medication out of the container. Mix the medication with cat litter, dirt, coffee grounds, or other unwanted substance. Seal the mixture in a bag or container. Put it in the trash. NOTE: This sheet is a summary. It may not cover all possible information. If you have questions about this medicine, talk to your doctor, pharmacist, or health care provider.  2024 Elsevier/Gold Standard (2023-01-16 00:00:00)

## 2023-11-30 NOTE — Patient Instructions (Signed)
 [facility] - A DEPT OF [facility]  Discharge Instructions: Thank you for choosing [facility] to provide your oncology and hematology care.   If you have a lab appointment with the Cancer Center, please go directly to the Cancer Center and check in at the registration area.   Wear comfortable clothing and clothing appropriate for easy access to any Portacath or PICC line.   We strive to give you quality time with your provider. You may need to reschedule your appointment if you arrive late (15 or more minutes).  Arriving late affects you and other patients whose appointments are after yours.  Also, if you miss three or more appointments without notifying the office, you may be dismissed from the clinic at the provider's discretion.      For prescription refill requests, have your pharmacy contact our office and allow 72 hours for refills to be completed.    Today you received the following chemotherapy and/or immunotherapy agents opdivo      To help prevent nausea and vomiting after your treatment, we encourage you to take your nausea medication as directed.  BELOW ARE SYMPTOMS THAT SHOULD BE REPORTED IMMEDIATELY: *FEVER GREATER THAN 100.4 F (38 C) OR HIGHER *CHILLS OR SWEATING *NAUSEA AND VOMITING THAT IS NOT CONTROLLED WITH YOUR NAUSEA MEDICATION *UNUSUAL SHORTNESS OF BREATH *UNUSUAL BRUISING OR BLEEDING *URINARY PROBLEMS (pain or burning when urinating, or frequent urination) *BOWEL PROBLEMS (unusual diarrhea, constipation, pain near the anus) TENDERNESS IN MOUTH AND THROAT WITH OR WITHOUT PRESENCE OF ULCERS (sore throat, sores in mouth, or a toothache) UNUSUAL RASH, SWELLING OR PAIN  UNUSUAL VAGINAL DISCHARGE OR ITCHING   Items with * indicate a potential emergency and should be followed up as soon as possible or go to the Emergency Department if any problems should occur.  Please show the CHEMOTHERAPY ALERT CARD or IMMUNOTHERAPY  ALERT CARD at check-in to the Emergency Department and triage nurse.  Should you have questions after your visit or need to cancel or reschedule your appointment, please contact [facility] - A DEPT OF [facility]  Dept: [phone]  and follow the prompts.  Office hours are 8:00 a.m. to 4:30 p.m. Monday - Friday. Please note that voicemails left after 4:00 p.m. may not be returned until the following business day.  We are closed weekends and major holidays. You have access to a nurse at all times for urgent questions. Please call the main number to the clinic Dept: [phone] and follow the prompts.   For any non-urgent questions, you may also contact your provider using MyChart. We now offer e-Visits for anyone 74 and older to request care online for non-urgent symptoms. For details visit [email or web address].   Also download the MyChart app! Go to the app store, search "MyChart", open the app, select [facility], and log in with your MyChart username and password.

## 2023-11-30 NOTE — Telephone Encounter (Signed)
 Peer Dr. Lanny, called pt to make aware that today's appt with provider will be cancelled due to seeing provider the previous week. Pt verbalized understanding.

## 2023-12-01 ENCOUNTER — Telehealth: Payer: Self-pay

## 2023-12-01 ENCOUNTER — Other Ambulatory Visit: Payer: Self-pay

## 2023-12-01 LAB — T4: T4, Total: 6.7 ug/dL (ref 4.5–12.0)

## 2023-12-01 NOTE — Telephone Encounter (Signed)
 Pt called stating that he got Nivolumab and is experiencing itching in his face.  Pt denied rash or hives.  Pt also denied SOB.  Pt denied eating anything different from his normal foods he consume daily.  Pt stated he called the on-call service and was told to not take Diphenhydramine  because he was on Eliquis .  Stated that Eliquis  and Diphenhydramine  does not have a drug interaction so pt should be OK to take 1ea 25mg  of Diphenhydramine .  Pt verbalized understanding and had no further questions or concerns.  Pt also wanted to know how much longer will he be on Eliquis .  Stated this nurse will f/u with Dr. Lanny and her team.

## 2023-12-01 NOTE — Telephone Encounter (Signed)
 Spoke with pt regarding Lacie's reply to previous telephone conversation with pt.  Pt stated he will continue to take the Eliquis  for now and will further discuss when he comes back into the office with the provider.

## 2023-12-07 ENCOUNTER — Ambulatory Visit

## 2023-12-16 NOTE — H&P (Signed)
 HPI:   Melvin Hudson is a 68 y.o. male who presents as a return Patient.   Current problem: Esophageal cancer, dysphagia.  HPI: Completed his treatments about a month ago. He still has a gastrostomy tube. He is able to swallow some liquids and soft things if he chews them very well but he cannot eat anything solid.  PMH/Meds/All/SocHx/FamHx/ROS:   Medical History[1]  Surgical History[2]  No family history of bleeding disorders, wound healing problems or difficulty with anesthesia.     Current Medications[3]   Physical Exam:   Healthy-appearing in no distress. Breathing and voice are clear. Oral cavity and pharynx are healthy and clear. Indirect laryngoscopy reveals mild supraglottic edema but no masses palpable. Airway is clear. No palpable neck masses.  Independent Review of Additional Tests or Records:  none  Procedures:  none  Impression & Plans:  Upper esophageal cancer status posttreatment. Will schedule for esophagoscopy, possible biopsy if there is anything suspicious, and dilatation to see if we can get him swallowing better.

## 2023-12-28 ENCOUNTER — Inpatient Hospital Stay

## 2023-12-28 ENCOUNTER — Inpatient Hospital Stay: Admitting: Hematology

## 2023-12-28 ENCOUNTER — Inpatient Hospital Stay: Attending: Hematology

## 2023-12-28 VITALS — BP 128/88 | HR 74 | Temp 98.3°F | Wt 250.6 lb

## 2023-12-28 DIAGNOSIS — C153 Malignant neoplasm of upper third of esophagus: Secondary | ICD-10-CM | POA: Diagnosis not present

## 2023-12-28 DIAGNOSIS — Z79899 Other long term (current) drug therapy: Secondary | ICD-10-CM | POA: Insufficient documentation

## 2023-12-28 DIAGNOSIS — K219 Gastro-esophageal reflux disease without esophagitis: Secondary | ICD-10-CM | POA: Diagnosis not present

## 2023-12-28 DIAGNOSIS — Z5111 Encounter for antineoplastic chemotherapy: Secondary | ICD-10-CM | POA: Diagnosis present

## 2023-12-28 DIAGNOSIS — Z7901 Long term (current) use of anticoagulants: Secondary | ICD-10-CM | POA: Insufficient documentation

## 2023-12-28 DIAGNOSIS — L299 Pruritus, unspecified: Secondary | ICD-10-CM | POA: Insufficient documentation

## 2023-12-28 DIAGNOSIS — Z86718 Personal history of other venous thrombosis and embolism: Secondary | ICD-10-CM | POA: Diagnosis not present

## 2023-12-28 DIAGNOSIS — Z923 Personal history of irradiation: Secondary | ICD-10-CM | POA: Insufficient documentation

## 2023-12-28 LAB — CMP (CANCER CENTER ONLY)
ALT: 12 U/L (ref 0–44)
AST: 15 U/L (ref 15–41)
Albumin: 3.7 g/dL (ref 3.5–5.0)
Alkaline Phosphatase: 147 U/L — ABNORMAL HIGH (ref 38–126)
Anion gap: 5 (ref 5–15)
BUN: 10 mg/dL (ref 8–23)
CO2: 30 mmol/L (ref 22–32)
Calcium: 9 mg/dL (ref 8.9–10.3)
Chloride: 105 mmol/L (ref 98–111)
Creatinine: 0.75 mg/dL (ref 0.61–1.24)
GFR, Estimated: >60 mL/min (ref >60)
Glucose, Bld: 128 mg/dL — ABNORMAL HIGH (ref 70–99)
Potassium: 4 mmol/L (ref 3.5–5.1)
Sodium: 140 mmol/L (ref 135–145)
Total Bilirubin: 0.7 mg/dL (ref 0.0–1.2)
Total Protein: 6.9 g/dL (ref 6.5–8.1)

## 2023-12-28 LAB — CBC WITH DIFFERENTIAL (CANCER CENTER ONLY)
Abs Immature Granulocytes: 0.05 K/uL (ref 0.00–0.07)
Basophils Absolute: 0 K/uL (ref 0.0–0.1)
Basophils Relative: 0 %
Eosinophils Absolute: 0.5 K/uL (ref 0.0–0.5)
Eosinophils Relative: 8 %
HCT: 34.5 % — ABNORMAL LOW (ref 39.0–52.0)
Hemoglobin: 11.8 g/dL — ABNORMAL LOW (ref 13.0–17.0)
Immature Granulocytes: 1 %
Lymphocytes Relative: 13 %
Lymphs Abs: 0.7 K/uL (ref 0.7–4.0)
MCH: 27.4 pg (ref 26.0–34.0)
MCHC: 34.2 g/dL (ref 30.0–36.0)
MCV: 80 fL (ref 80.0–100.0)
Monocytes Absolute: 0.7 K/uL (ref 0.1–1.0)
Monocytes Relative: 11 %
Neutro Abs: 3.8 K/uL (ref 1.7–7.7)
Neutrophils Relative %: 67 %
Platelet Count: 268 K/uL (ref 150–400)
RBC: 4.31 MIL/uL (ref 4.22–5.81)
RDW: 17.6 % — ABNORMAL HIGH (ref 11.5–15.5)
WBC Count: 5.8 K/uL (ref 4.0–10.5)
nRBC: 0 % (ref 0.0–0.2)

## 2023-12-28 MED ORDER — SODIUM CHLORIDE 0.9 % IV SOLN: INTRAVENOUS | Status: DC

## 2023-12-28 MED ORDER — SODIUM CHLORIDE 0.9 % IV SOLN
480.0000 mg | Freq: Once | INTRAVENOUS | Status: AC
  Administered 2023-12-28: 480 mg via INTRAVENOUS
  Filled 2023-12-28: qty 48

## 2023-12-28 MED ORDER — SODIUM CHLORIDE 0.9% FLUSH
10.0000 mL | INTRAVENOUS | Status: DC | PRN
  Administered 2023-12-28: 10 mL

## 2023-12-28 NOTE — Progress Notes (Signed)
 [facility]   [phone] [phone]   Clinic Follow up Note   Patient Care Team: Haze Kingfisher, MD as PCP - General (Family Medicine) Michele Richardson, DO as PCP - Cardiology (Cardiology) Clance, Francis HERO, MD as Referring Physician (Pulmonary Disease) Lonn Hicks, MD as Consulting Physician (Hematology and Oncology) [name], MD as Attending Physician (Radiation Oncology) [name], MD as Consulting Physician (Hematology and Oncology)  Date of Service:  12/28/2023  CHIEF COMPLAINT: f/u of esophageal cancer   CURRENT THERAPY:  Nivo every 4 weeks   Oncology History   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 -EGD on 05/27/2023 which showed a mass in the proximal esophagus, 2 to 3 cm below introitus.  Biopsy confirmed invasive squamous cell carcinoma.  -PET scan showed hypermetabolic lesion in the proximal esophagus, with a positive peritracheal lymph node.  No distant metastasis -Status post PEG feeding tube placement  -He started chemoRT on 06/24/2023 and completed on 08/03/23 -ED visit for chest pain on 06/29/2023 -repeated upper EGD and biopsy was negative for residual tumor on 10/30/2023 - Given the significant residual hypermetabolic activity on the PET scan and his high risk for recurrence, I recommend nivolumab for 1 year. He started on 11/30/2023  Assessment & Plan Malignant neoplasm of upper third of esophagus Undergoing treatment with nivolumab. Recent infusion on October 10th, 2025, resulted in a mild rash and pruritus. Scheduled for next infusion on December 8th, 2025. Monitoring for disease progression with scans every 3 to 6 months. Recent endoscopy showed no visible cancer, but biopsy was performed to assess for residual disease. Need to ensure no metastasis outside the esophagus, including lymph nodes and other parts of the  body. - Continue nivolumab treatment with next infusion scheduled for December 8th, 2025. - Will order scan in late January to monitor for disease progression. - Continue Eliquis  long-term as per previous instructions.  Rash and pruritus secondary to immunotherapy Mild rash and pruritus developed after nivolumab infusion, resolved overnight. Advised that Benadryl  can be taken despite concurrent Eliquis  use. - Take Benadryl  as needed for pruritus.  Gastroesophageal reflux disease Currently taking amantadine for acid reflux. Confusion regarding medication administration via feeding tube versus oral intake.    Plan - He tolerated first cycle nivolumab well except a skin itchiness after infusion, okay to take Benadryl  as needed. - Lab reviewed, adequate for treatment, will proceed to second cycle Nivo today and continue every 4 weeks - He will continue tube feeds for now, he is scheduled for repeated esophageal stretch by Dr. Jesus later this week. - Follow-up in 4 weeks  SUMMARY OF ONCOLOGIC HISTORY: Oncology History  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HCC)  06/14/2013 Procedure   Biopsy of the tonsil confirmrf squamous cell carcinoma. 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  History of Present Illness Melvin Hudson is a 68 year old male with esophageal cancer who presents for follow-up.  He received nivolumab four weeks ago and experienced itching on his head and face following the infusion. Itching persisted overnight but did not disrupt sleep. He clarified that Benadryl  can be taken if needed for itching, despite concurrent use of Eliquis .  He is on Eliquis  and inquired about its duration. He uses amantadine for acid reflux, administered through a feeding tube at 2.5 mL in the morning.  Nutritionally, he receives three feeds per day through a feeding tube and is not eating by mouth. An upcoming procedure to stretch his throat is planned to improve swallowing. No recent changes in swallowing or eating ability.  He experiences neck pain, rated 4-5 out of 10, possibly related to a procedure position, managed with a heating massage device and over-the-counter pain medication. No shortness of breath or dietary changes.     All other systems were reviewed with the patient and are negative.  MEDICAL HISTORY:  Past Medical History:  Diagnosis Date   Angioedema 09/17/2011   tongue and lips   Arthritis    both of my legs and feet (01/05/2014)   CHF (congestive heart failure) (HCC)    Diabetes mellitus without complication (HCC)    DVT (deep venous thrombosis) (HCC) 06/16/2023   LLE   Erectile dysfunction 04/20/2018   GI bleed    Heart murmur 04/20/2018   History of radiation therapy    06/24/2023- 08/03/2023 Dr. 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    SURGICAL HISTORY: Past Surgical History:  Procedure Laterality Date   COLONOSCOPY Left 04/11/2014   Procedure: COLONOSCOPY;  Surgeon: Elsie Cree, MD;  Location: [facility] ENDOSCOPY;  Service: Endoscopy;  Laterality: Left;   COLONOSCOPY N/A 04/20/2014   Procedure: COLONOSCOPY;  Surgeon: Jerrell KYM Sol, MD;  Location: [facility] ENDOSCOPY;  Service: Endoscopy;  Laterality: N/A;   COLONOSCOPY W/ BIOPSIES AND POLYPECTOMY     benign   DIRECT LARYNGOSCOPY  01/05/2014   DIRECT LARYNGOSCOPY N/A 01/05/2014   Procedure: DIRECT LARYNGOSCOPY AND BIOPSY;  Surgeon: Ida Loader, MD;  Location[facility] OR;  Service: ENT;  Laterality: N/A;   ESOPHAGOSCOPY WITH DILITATION N/A 05/27/2023   Procedure: ESOPHAGOSCOPY;  Surgeon: Loader Ida, MD;  Location: [facility] OR;  Service: ENT;  Laterality: N/A;   ESOPHAGOSCOPY WITH DILITATION N/A 10/30/2023   Procedure: ESOPHAGOSCOPY, WITH BIOPSY;  Surgeon: Loader Ida, MD;  Location[facility] OR;  Service: ENT;  Laterality: N/A;  With possible biopsy   IR GASTROSTOMY TUBE MOD SED  06/12/2023   IR IMAGING GUIDED PORT INSERTION  11/24/2023   IR RADIOLOGIST EVAL & MGMT  08/10/2023   IR RADIOLOGIST EVAL & MGMT  08/14/2023   KNEE ARTHROSCOPY Right ~ 1994   LEFT AND RIGHT HEART CATHETERIZATION WITH  CORONARY/GRAFT ANGIOGRAM N/A 12/31/2010   Procedure: LEFT AND RIGHT HEART CATHETERIZATION WITH EL BILE;  Surgeon: Erick JONELLE Bergamo, MD;  Location: [facility] CATH LAB;  Service: Cardiovascular;  Laterality: N/A;   MULTIPLE EXTRACTIONS WITH ALVEOLOPLASTY N/A 07/08/2013   Procedure: Extraction of tooth #'s 22, 27 with alveoloplasty and bilateal mandibular facial exostoses reductions;  Surgeon: Tanda JULIANNA Fanny, DDS;  Location: [facility] ORS;  Service: Oral Surgery;  Laterality: N/A;   MULTIPLE TOOTH EXTRACTIONS  ~ 2008    I have reviewed the social history and family history with the patient and they are unchanged from previous note.  ALLERGIES:  is  allergic to azilsartan and advil [ibuprofen].  MEDICATIONS:  Current Outpatient Medications  Medication Sig Dispense Refill   albuterol  (VENTOLIN  HFA) 108 (90 Base) MCG/ACT inhaler Inhale 2 puffs into the lungs every 4 (four) hours as needed for wheezing (Cough).     allopurinol  (ZYLOPRIM ) 300 MG tablet Take 300 mg by mouth every other day.     APIXABAN  (ELIQUIS ) VTE STARTER PACK (10MG  AND 5MG ) Take as directed on package: start with two-5mg  tablets twice daily for 7 days. On day 8, switch to one-5mg  tablet twice daily. 74 each 0   atorvastatin  (LIPITOR) 40 MG tablet Take 1 tablet (40 mg total) by mouth at bedtime. 90 tablet 1   bismuth subsalicylate (PEPTO BISMOL) 262 MG/15ML suspension Take 30 mLs by mouth every 6 (six) hours as needed for indigestion or diarrhea or loose stools.     Blood Glucose Monitoring Suppl (ONETOUCH VERIO FLEX SYSTEM) w/Device KIT USE TO CHECK BLOOD SUGAR TWICE DAILY     calcium  carbonate (TUMS - DOSED IN MG ELEMENTAL CALCIUM ) 500 MG chewable tablet Chew 2 tablets (400 mg of elemental calcium  total) by mouth 3 (three) times daily with meals. 180 tablet 1   carvedilol  (COREG ) 3.125 MG tablet Take 3.125 mg by mouth 2 (two) times daily with a meal.     clonazePAM  (KLONOPIN ) 1 MG disintegrating tablet Take 1 mg by mouth daily.     dapagliflozin  propanediol (FARXIGA ) 10 MG TABS tablet Take 10 mg by mouth daily.     diphenhydrAMINE  (BENADRYL ) 25 MG tablet Take 1 tablet (25 mg total) by mouth every 6 (six) hours. 20 tablet 0   ELIQUIS  5 MG TABS tablet TAKE 1 TABLET(5 MG) BY MOUTH TWICE DAILY 60 tablet 1   famotidine  (PEPCID ) 40 MG/5ML suspension PLACE 2.5 MILLILITERS INTO THE FEEDING TUBE DAILY 50 mL 1   gabapentin  (NEURONTIN ) 300 MG capsule Take 300 mg by mouth 3 (three) times daily.   2   HYDROcodone -acetaminophen  (HYCET) 7.5-325 mg/15 ml solution Take 15 mLs by mouth 4 (four) times daily as needed for moderate pain (pain score 4-6). 473 mL 0   hydrocortisone  (ANUSOL -HC) 2.5  % rectal cream Place 1 Application rectally daily as needed for hemorrhoids or anal itching.     isosorbide -hydrALAZINE  (BIDIL ) 20-37.5 MG tablet Take 1 tablet by mouth in the morning and at bedtime. 90 tablet 3   lactose free nutrition (BOOST) LIQD Take 237 mLs by mouth daily.     lidocaine  (XYLOCAINE ) 2 % solution MIX 1 PART(5ML) OF LIDOCAINE  AND 1 PART WATER ( ). SWALLOW OF DILUTED MIXTURE 30 MINUTES BEFORE MEALS AND BEDTIME FOR UP TO FOUR TIMES DAILY 200 mL 3   lidocaine -prilocaine (EMLA) cream Apply 1 Application topically as needed. 30 g 1   LORazepam  (ATIVAN ) 0.5 MG tablet Take 1 tablet PRN claustrophobia 20 minutes prior to radiation. Will need a driver.  Use only as needed. 5 tablet 0   nystatin  (MYCOSTATIN ) 100000 UNIT/ML suspension Take 10 mLs (1,000,000 Units total) by mouth 4 (four) times daily. For oral thrush. Stop when thrush resolves 473 mL 0   nystatin  (MYCOSTATIN ) 100000 UNIT/ML suspension Take 5 mLs (500,000 Units total) by mouth 4 (four) times daily. 473 mL 2   omeprazole  (PRILOSEC) 20 MG capsule Take 20 mg by mouth daily.   4   ondansetron  (ZOFRAN ) 8 MG tablet Take 1 tablet (8 mg total) by mouth every 8 (eight) hours as needed for nausea or vomiting. 20 tablet 2   ONETOUCH VERIO test strip USE TO CHECK BLOOD SUGAR TWICE DAILY     polyethylene glycol (MIRALAX  / GLYCOLAX ) 17 g packet Take 17 g by mouth daily. 14 each 0   prochlorperazine  (COMPAZINE ) 10 MG tablet Take 1 tablet (10 mg total) by mouth every 6 (six) hours as needed for nausea or vomiting. 30 tablet 2   Protein (FEEDING SUPPLEMENT, PROSOURCE TF20,) liquid Place 60 mLs into feeding tube 2 (two) times daily.     psyllium (HYDROCIL/METAMUCIL) 95 % PACK Take 1 packet by mouth daily. 240 each 0   senna-docusate (SENOKOT-S) 8.6-50 MG tablet Take 1 tablet by mouth 2 (two) times daily. 60 tablet 1   Water  For Irrigation, Sterile (FREE WATER ) SOLN Place 100 mLs into feeding tube every 6 (six) hours.     No current  facility-administered medications for this visit.   Facility-Administered Medications Ordered in Other Visits  Medication Dose Route Frequency Provider Last Rate Last Admin   0.9 %  sodium chloride  infusion   Intravenous Continuous [name], MD 10 mL/hr at 12/28/23 0839 New Bag at 12/28/23 0839   nivolumab (OPDIVO) 480 mg in sodium chloride  0.9 % 100 mL chemo infusion  480 mg Intravenous Once [name], MD       sodium chloride  flush (NS) 0.9 % injection 10 mL  10 mL Intracatheter PRN [name], MD        PHYSICAL EXAMINATION: ECOG PERFORMANCE STATUS: 1 - Symptomatic but completely ambulatory  Vitals:   12/28/23 0754  BP: 128/88  Pulse: 74  Temp: 98.3 F (36.8 C)  SpO2: 100%   Wt Readings from Last 3 Encounters:  12/28/23 250 lb 9.6 oz (113.7 kg)  11/30/23 258 lb (117 kg)  11/25/23 257 lb 11.2 oz (116.9 kg)     GENERAL:alert, no distress and comfortable SKIN: skin color, texture, turgor are normal, no rashes or significant lesions EYES: normal, Conjunctiva are pink and non-injected, sclera clear NECK: supple, thyroid  normal size, non-tender, without nodularity LYMPH:  no palpable lymphadenopathy in the cervical, axillary  LUNGS: clear to auscultation and percussion with normal breathing effort HEART: regular rate & rhythm and no murmurs and no lower extremity edema ABDOMEN:abdomen soft, non-tender and normal bowel sounds Musculoskeletal:no cyanosis of digits and no clubbing  NEURO: alert & oriented x 3 with fluent speech, no focal motor/sensory deficits  Physical Exam    LABORATORY DATA:  I have reviewed the data as listed    Latest Ref Rng & Units 12/28/2023    7:15 AM 11/30/2023   12:39 PM 10/29/2023    8:36 AM  CBC  WBC 4.0 - 10.5 K/uL 5.8  5.2  5.7   Hemoglobin 13.0 - 17.0 g/dL 88.1  87.6  86.5   Hematocrit 39.0 - 52.0 % 34.5  34.9  38.3   Platelets 150 - 400 K/uL 268  235  203  Latest Ref Rng & Units 12/28/2023    7:15 AM 11/30/2023   12:39 PM  10/29/2023    8:36 AM  CMP  Glucose 70 - 99 mg/dL 871  91  892   BUN 8 - 23 mg/dL 10  9  8    Creatinine 0.61 - 1.24 mg/dL 9.24  9.37  9.30   Sodium 135 - 145 mmol/L 140  139  141   Potassium 3.5 - 5.1 mmol/L 4.0  3.9  3.9   Chloride 98 - 111 mmol/L 105  104  104   CO2 22 - 32 mmol/L 30  32  35   Calcium  8.9 - 10.3 mg/dL 9.0  9.4  9.0   Total Protein 6.5 - 8.1 g/dL 6.9  7.1  6.9   Total Bilirubin 0.0 - 1.2 mg/dL 0.7  1.0  0.8   Alkaline Phos 38 - 126 U/L 147  138  138   AST 15 - 41 U/L 15  15  16    ALT 0 - 44 U/L 12  9  14        RADIOGRAPHIC STUDIES: I have personally reviewed the radiological images as listed and agreed with the findings in the report. No results found.    Orders Placed This Encounter  Procedures   CBC with Differential (Cancer Center Only)    Standing Status:   Future    Expected Date:   03/21/2024    Expiration Date:   03/21/2025   CMP (Cancer Center only)    Standing Status:   Future    Expected Date:   03/21/2024    Expiration Date:   03/21/2025   T4    Standing Status:   Future    Expected Date:   03/21/2024    Expiration Date:   03/21/2025   TSH    Standing Status:   Future    Expected Date:   03/21/2024    Expiration Date:   03/21/2025   All questions were answered. The patient knows to call the clinic with any problems, questions or concerns. No barriers to learning was detected. The total time spent in the appointment was 25 minutes, including review of chart and various tests results, discussions about plan of care and coordination of care plan     Onita Mattock, MD 12/28/2023

## 2023-12-28 NOTE — Assessment & Plan Note (Signed)
-  cTxN1M0, invasive squamous cell carcinoma -Present with progressive dysphagia since December 2024. -EGD on 05/27/2023 which showed a mass in the proximal esophagus, 2 to 3 cm below introitus.  Biopsy confirmed invasive squamous cell carcinoma.  -PET scan showed hypermetabolic lesion in the proximal esophagus, with a positive peritracheal lymph node.  No distant metastasis -Status post PEG feeding tube placement  -He started chemoRT on 06/24/2023 and completed on 08/03/23 -ED visit for chest pain on 06/29/2023 -repeated upper EGD and biopsy was negative for residual tumor on 10/30/2023 - Given the significant residual hypermetabolic activity on the PET scan and his high risk for recurrence, I recommend nivolumab for 1 year. He started on 11/30/2023

## 2023-12-30 ENCOUNTER — Encounter (HOSPITAL_COMMUNITY): Payer: Self-pay | Admitting: Otolaryngology

## 2023-12-31 ENCOUNTER — Other Ambulatory Visit: Payer: Self-pay | Admitting: Hematology

## 2023-12-31 ENCOUNTER — Other Ambulatory Visit: Payer: Self-pay | Admitting: Cardiology

## 2023-12-31 ENCOUNTER — Other Ambulatory Visit: Payer: Self-pay

## 2023-12-31 NOTE — Progress Notes (Signed)
 SDW CALL  Patient was given pre-op instructions over the phone. The opportunity was given for the patient to ask questions. No further questions asked. Patient verbalized understanding of instructions given.   PCP -  Haze Kingfisher, MD Cardiologist -  Michele Richardson, DO  PPM/ICD - denies Device Orders na-  Rep Notified -   Chest x-ray - 08/14/23 EKG - 06/29/23 Stress Test - 06/28/18 ECHO - 03/28/22 Cardiac Cath 12/31/15  Sleep Study -na  CPAP - yes  Fasting Blood Sugar - 143 Checks Blood Sugar one time a day  Blood Thinner Instructions:pt states no hold for this procedure per Dr, Jesus Aspirin  Instructions:na  ERAS Protcol -NPO PRE-SURGERY Ensure or G2-   COVID TEST- na   Anesthesia review: yes -CHF,DM2,OSA   Patient denies shortness of breath, fever, cough and chest pain over the phone call

## 2023-12-31 NOTE — Progress Notes (Signed)
------------------------------------------  [facility] PROCEDURAL EXPEDITER NOTE-------------------------------------------------  Patient Name: Melvin Hudson Patient DOB: 1955-03-24 Today's Date: @TODAY @   Chart reviewed:  Yes  Documentation gaps: n/a Orders in place:  Yes  Communication with surgical team if no orders: n/a Labs, test, and orders reviewed: yes Requires surgical clearance:  No What type of clearance: n/a Clearance received: n/a Patient status:pre op   Barriers noted:n/a  Intervention provided by [facility] team: n/a Barrier resolved:  Yes   Ronal Bald, RN [employer]

## 2024-01-01 ENCOUNTER — Encounter (HOSPITAL_COMMUNITY): Admission: RE | Disposition: A | Payer: Self-pay | Source: Home / Self Care | Attending: Otolaryngology

## 2024-01-01 ENCOUNTER — Encounter (HOSPITAL_COMMUNITY): Payer: Self-pay | Admitting: Otolaryngology

## 2024-01-01 ENCOUNTER — Ambulatory Visit (HOSPITAL_COMMUNITY): Admitting: Anesthesiology

## 2024-01-01 ENCOUNTER — Ambulatory Visit (HOSPITAL_COMMUNITY)
Admission: RE | Admit: 2024-01-01 | Discharge: 2024-01-01 | Disposition: A | Discharge status: Home / Self Care | Attending: Otolaryngology | Admitting: Otolaryngology

## 2024-01-01 DIAGNOSIS — Z86718 Personal history of other venous thrombosis and embolism: Secondary | ICD-10-CM | POA: Insufficient documentation

## 2024-01-01 DIAGNOSIS — G4730 Sleep apnea, unspecified: Secondary | ICD-10-CM | POA: Insufficient documentation

## 2024-01-01 DIAGNOSIS — Z931 Gastrostomy status: Secondary | ICD-10-CM | POA: Insufficient documentation

## 2024-01-01 DIAGNOSIS — R1314 Dysphagia, pharyngoesophageal phase: Secondary | ICD-10-CM | POA: Diagnosis present

## 2024-01-01 DIAGNOSIS — C153 Malignant neoplasm of upper third of esophagus: Secondary | ICD-10-CM | POA: Diagnosis present

## 2024-01-01 DIAGNOSIS — I509 Heart failure, unspecified: Secondary | ICD-10-CM | POA: Diagnosis not present

## 2024-01-01 DIAGNOSIS — I110 Hypertensive heart disease with heart failure: Secondary | ICD-10-CM | POA: Insufficient documentation

## 2024-01-01 DIAGNOSIS — Z87891 Personal history of nicotine dependence: Secondary | ICD-10-CM

## 2024-01-01 DIAGNOSIS — E119 Type 2 diabetes mellitus without complications: Secondary | ICD-10-CM | POA: Diagnosis not present

## 2024-01-01 HISTORY — DX: Dysphagia, unspecified: R13.10

## 2024-01-01 HISTORY — PX: ESOPHAGEAL DILATION: SHX303

## 2024-01-01 HISTORY — DX: Malignant neoplasm of esophagus, unspecified: C15.9

## 2024-01-01 LAB — GLUCOSE, CAPILLARY
Glucose-Capillary: 115 mg/dL — ABNORMAL HIGH (ref 70–99)
Glucose-Capillary: 103 mg/dL — ABNORMAL HIGH (ref 70–99)

## 2024-01-01 SURGERY — DILATION, ESOPHAGUS
Anesthesia: General | Site: Throat

## 2024-01-01 MED ORDER — MIDAZOLAM HCL 2 MG/2ML IJ SOLN
INTRAMUSCULAR | Status: AC
  Filled 2024-01-01: qty 2

## 2024-01-01 MED ORDER — ONDANSETRON HCL 4 MG/2ML IJ SOLN
INTRAMUSCULAR | Status: DC | PRN
  Administered 2024-01-01: 4 mg via INTRAVENOUS

## 2024-01-01 MED ORDER — PROPOFOL 10 MG/ML IV BOLUS
INTRAVENOUS | Status: DC | PRN
  Administered 2024-01-01: 120 mg via INTRAVENOUS

## 2024-01-01 MED ORDER — DEXAMETHASONE SOD PHOSPHATE PF 10 MG/ML IJ SOLN
INTRAMUSCULAR | Status: DC | PRN
  Administered 2024-01-01: 10 mg via INTRAVENOUS

## 2024-01-01 MED ORDER — MIDAZOLAM HCL (PF) 2 MG/2ML IJ SOLN
INTRAMUSCULAR | Status: DC | PRN
  Administered 2024-01-01: 1 mg via INTRAVENOUS

## 2024-01-01 MED ORDER — INSULIN ASPART 100 UNIT/ML IJ SOLN: 0.0000 [IU] | INTRAMUSCULAR | Status: DC | PRN

## 2024-01-01 MED ORDER — LIDOCAINE 2% (20 MG/ML) 5 ML SYRINGE
INTRAMUSCULAR | Status: AC
  Filled 2024-01-01: qty 5

## 2024-01-01 MED ORDER — PROPOFOL 10 MG/ML IV BOLUS
INTRAVENOUS | Status: AC
Start: 2024-01-01 — End: 2024-01-01
  Filled 2024-01-01: qty 20

## 2024-01-01 MED ORDER — LIDOCAINE 2% (20 MG/ML) 5 ML SYRINGE
INTRAMUSCULAR | Status: DC | PRN
  Administered 2024-01-01: 100 mg via INTRAVENOUS

## 2024-01-01 MED ORDER — ONDANSETRON HCL 4 MG/2ML IJ SOLN: 4.0000 mg | Freq: Once | INTRAMUSCULAR | Status: DC | PRN

## 2024-01-01 MED ORDER — SUGAMMADEX SODIUM 200 MG/2ML IV SOLN
INTRAVENOUS | Status: DC | PRN
  Administered 2024-01-01: 225 mg via INTRAVENOUS

## 2024-01-01 MED ORDER — LACTATED RINGERS IV SOLN: INTRAVENOUS | Status: DC

## 2024-01-01 MED ORDER — CHLORHEXIDINE GLUCONATE 0.12 % MT SOLN
15.0000 mL | Freq: Once | OROMUCOSAL | Status: AC
  Administered 2024-01-01: 15 mL via OROMUCOSAL
  Filled 2024-01-01: qty 15

## 2024-01-01 MED ORDER — ROCURONIUM BROMIDE 10 MG/ML (PF) SYRINGE
PREFILLED_SYRINGE | INTRAVENOUS | Status: DC | PRN
  Administered 2024-01-01: 10 mg via INTRAVENOUS
  Administered 2024-01-01: 40 mg via INTRAVENOUS

## 2024-01-01 MED ORDER — FENTANYL CITRATE (PF) 100 MCG/2ML IJ SOLN: 25.0000 ug | INTRAMUSCULAR | Status: DC | PRN

## 2024-01-01 MED ORDER — AMISULPRIDE (ANTIEMETIC) 5 MG/2ML IV SOLN: 10.0000 mg | Freq: Once | INTRAVENOUS | Status: DC | PRN

## 2024-01-01 MED ORDER — ACETAMINOPHEN 10 MG/ML IV SOLN: 1000.0000 mg | Freq: Once | INTRAVENOUS | Status: DC | PRN

## 2024-01-01 MED ORDER — EPINEPHRINE HCL (NASAL) 0.1 % NA SOLN
NASAL | Status: AC
Start: 2024-01-01 — End: 2024-01-01
  Filled 2024-01-01: qty 30

## 2024-01-01 MED ORDER — ROCURONIUM BROMIDE 10 MG/ML (PF) SYRINGE
PREFILLED_SYRINGE | INTRAVENOUS | Status: AC
  Filled 2024-01-01: qty 10

## 2024-01-01 MED ORDER — 0.9 % SODIUM CHLORIDE (POUR BTL) OPTIME
TOPICAL | Status: DC | PRN
  Administered 2024-01-01: 1000 mL

## 2024-01-01 MED ORDER — FENTANYL CITRATE (PF) 250 MCG/5ML IJ SOLN
INTRAMUSCULAR | Status: DC | PRN
Start: 2024-01-01 — End: 2024-01-01
  Administered 2024-01-01: 25 ug via INTRAVENOUS
  Administered 2024-01-01: 50 ug via INTRAVENOUS
  Administered 2024-01-01: 25 ug via INTRAVENOUS

## 2024-01-01 MED ORDER — FENTANYL CITRATE (PF) 100 MCG/2ML IJ SOLN
INTRAMUSCULAR | Status: AC
  Filled 2024-01-01: qty 2

## 2024-01-01 MED ORDER — ONDANSETRON HCL 4 MG/2ML IJ SOLN
INTRAMUSCULAR | Status: AC
  Filled 2024-01-01: qty 2

## 2024-01-01 MED ORDER — ORAL CARE MOUTH RINSE: 15.0000 mL | Freq: Once | OROMUCOSAL | Status: AC

## 2024-01-01 SURGICAL SUPPLY — 17 items
BAG COUNTER SPONGE SURGICOUNT (BAG) ×1 IMPLANT
BALLOON PULM 15 16.5 18X75 (BALLOONS) ×1 IMPLANT
CANISTER SUCTION 3000ML PPV (SUCTIONS) ×1 IMPLANT
COVER BACK TABLE 60X90IN (DRAPES) ×1 IMPLANT
DRAPE HALF SHEET 40X57 (DRAPES) ×1 IMPLANT
GAUZE SPONGE 4X4 12PLY STRL (GAUZE/BANDAGES/DRESSINGS) IMPLANT
GLOVE BIO SURGEON STRL SZ7.5 (GLOVE) ×1 IMPLANT
KIT BASIN OR (CUSTOM PROCEDURE TRAY) ×1 IMPLANT
KIT TURNOVER KIT B (KITS) ×1 IMPLANT
PAD ARMBOARD POSITIONER FOAM (MISCELLANEOUS) ×2 IMPLANT
POSITIONER HEAD DONUT 9IN (MISCELLANEOUS) IMPLANT
SOLN 0.9% NACL POUR BTL 1000ML (IV SOLUTION) ×1 IMPLANT
SOLN STERILE WATER BTL 1000 ML (IV SOLUTION) ×1 IMPLANT
SOLUTION ANTFG W/FOAM PAD STRL (MISCELLANEOUS) IMPLANT
SURGILUBE 2OZ TUBE FLIPTOP (MISCELLANEOUS) ×1 IMPLANT
TOWEL GREEN STERILE FF (TOWEL DISPOSABLE) ×2 IMPLANT
TUBE CONNECTING 12X1/4 (SUCTIONS) ×1 IMPLANT

## 2024-01-01 NOTE — Interval H&P Note (Signed)
 History and Physical Interval Note:  01/01/2024 7:22 AM  Melvin Hudson  has presented today for surgery, with the diagnosis of Pharyngoesophageal dysphagia Malignant neoplasm of upper third of esophagus.  The various methods of treatment have been discussed with the patient and family. After consideration of risks, benefits and other options for treatment, the patient has consented to  Procedure(s): DILATION, ESOPHAGUS (N/A) as a surgical intervention.  The patient's history has been reviewed, patient examined, no change in status, stable for surgery.  I have reviewed the patient's chart and labs.  Questions were answered to the patient's satisfaction.     Ida Loader

## 2024-01-01 NOTE — Anesthesia Procedure Notes (Signed)
 Procedure Name: Intubation Date/Time: 01/01/2024 7:44 AM  Performed by: Vera Rochele PARAS, CRNAPre-anesthesia Checklist: Patient identified, Emergency Drugs available, Suction available and Patient being monitored Patient Re-evaluated:Patient Re-evaluated prior to induction Oxygen Delivery Method: Circle System Utilized Preoxygenation: Pre-oxygenation with 100% oxygen Induction Type: IV induction Ventilation: Mask ventilation without difficulty and Oral airway inserted - appropriate to patient size Laryngoscope Size: Glidescope and 4 Grade View: Grade I Tube type: Oral Tube size: 7.5 mm Number of attempts: 1 Airway Equipment and Method: Stylet and Oral airway Placement Confirmation: ETT inserted through vocal cords under direct vision, positive ETCO2 and breath sounds checked- equal and bilateral Secured at: 21 cm Tube secured with: Tape Dental Injury: Teeth and Oropharynx as per pre-operative assessment

## 2024-01-01 NOTE — Anesthesia Preprocedure Evaluation (Addendum)
 Anesthesia Evaluation  Patient identified by MRN, date of birth, ID band Patient awake    Reviewed: Allergy & Precautions, NPO status , Patient's Chart, lab work & pertinent test results  Airway Mallampati: II  TM Distance: >3 FB Neck ROM: Full    Dental  (+) Edentulous Lower, Edentulous Upper   Pulmonary sleep apnea and Continuous Positive Airway Pressure Ventilation , former smoker   Pulmonary exam normal        Cardiovascular hypertension, Pt. on home beta blockers and Pt. on medications +CHF and + DVT  Normal cardiovascular exam     Neuro/Psych   Anxiety        GI/Hepatic negative GI ROS, Neg liver ROS,,,  Endo/Other  diabetes    Renal/GU      Musculoskeletal   Abdominal  (+) + obese  Peds  Hematology  (+) Blood dyscrasia (Eliquis ), anemia   Anesthesia Other Findings Pharyngoesophageal dysphagia  Malignant neoplasm of upper third of esophagus    Reproductive/Obstetrics                              Anesthesia Physical Anesthesia Plan  ASA: 3  Anesthesia Plan: General   Post-op Pain Management:    Induction: Intravenous  PONV Risk Score and Plan: 2 and Ondansetron , Dexamethasone , Midazolam  and Treatment may vary due to age or medical condition  Airway Management Planned: Oral ETT  Additional Equipment:   Intra-op Plan:   Post-operative Plan: Extubation in OR  Informed Consent: I have reviewed the patients History and Physical, chart, labs and discussed the procedure including the risks, benefits and alternatives for the proposed anesthesia with the patient or authorized representative who has indicated his/her understanding and acceptance.     Dental advisory given  Plan Discussed with: CRNA  Anesthesia Plan Comments:          Anesthesia Quick Evaluation

## 2024-01-01 NOTE — Transfer of Care (Signed)
 Immediate Anesthesia Transfer of Care Note  Patient: Melvin Hudson  Procedure(s) Performed: DILATION, ESOPHAGUS (Throat)  Patient Location: PACU  Anesthesia Type:General  Level of Consciousness: awake, alert , and oriented  Airway & Oxygen Therapy: Patient Spontanous Breathing and Patient connected to face mask oxygen  Post-op Assessment: Report given to RN and Post -op Vital signs reviewed and stable  Post vital signs: Reviewed and stable  Last Vitals:  Vitals Value Taken Time  BP    Temp    Pulse 77 01/01/24 08:25  Resp 23 01/01/24 08:25  SpO2 100 % 01/01/24 08:25  Vitals shown include unfiled device data.  Last Pain:  Vitals:   01/01/24 0617  TempSrc:   PainSc: 4       Patients Stated Pain Goal: 2 (01/01/24 0617)  Complications: No notable events documented.

## 2024-01-01 NOTE — Op Note (Signed)
 OPERATIVE REPORT  DATE OF SURGERY: 01/01/2024  PATIENT:  Melvin Hudson,  68 y.o. male  PRE-OPERATIVE DIAGNOSIS:  Pharyngoesophageal dysphagia Malignant neoplasm of upper third of esophagus  POST-OPERATIVE DIAGNOSIS:  Pharyngoesophageal dysphagiaMalignant neoplasm of upper third of esophagus  PROCEDURE:  Procedure(s): Esophagoscopy, DILATION, ESOPHAGUS  SURGEON:  Ida VEAR Loader, MD  ASSISTANTS: None  ANESTHESIA:   General   EBL: Less than 5 ml  DRAINS: None  LOCAL MEDICATIONS USED:  None  SPECIMEN:  none  COUNTS:  Correct  PROCEDURE DETAILS: The patient was taken to the operating room and placed on the operating table in the supine position. Following induction of general endotracheal anesthesia, the table was turned 90 degrees and the patient was draped for upper endoscopy.  Patient is edentulous.  Jako laryngoscope was used to evaluate the hypopharynx and to see the esophageal introitus.  Maloney dilators were used.  The #18 was attempted but was not stiff enough and I could not pass it into the esophagus.  I then attempted with a 24 dilator and was able to get to about 25 cm.  I was able to go in and out to try to dilate and stretch.  I then moved up to a 28 which I was able to only get about 15 cm.  I did not want to force this because of perforation.  I did not attempt to dilate any further.  Scope was removed and the patient was awakened extubated and transferred to recovery in stable condition.    PATIENT DISPOSITION:  To PACU, stable

## 2024-01-01 NOTE — Discharge Instructions (Signed)
 Start with liquids and advance as tolerated if able to.

## 2024-01-03 ENCOUNTER — Other Ambulatory Visit: Payer: Self-pay

## 2024-01-03 ENCOUNTER — Emergency Department (HOSPITAL_COMMUNITY)

## 2024-01-03 ENCOUNTER — Emergency Department (HOSPITAL_COMMUNITY)
Admission: EM | Admit: 2024-01-03 | Discharge: 2024-01-03 | Disposition: A | Discharge status: Home / Self Care | Attending: Emergency Medicine | Admitting: Emergency Medicine

## 2024-01-03 DIAGNOSIS — Z7984 Long term (current) use of oral hypoglycemic drugs: Secondary | ICD-10-CM | POA: Insufficient documentation

## 2024-01-03 DIAGNOSIS — I10 Essential (primary) hypertension: Secondary | ICD-10-CM | POA: Insufficient documentation

## 2024-01-03 DIAGNOSIS — Z7901 Long term (current) use of anticoagulants: Secondary | ICD-10-CM | POA: Diagnosis not present

## 2024-01-03 DIAGNOSIS — Z8501 Personal history of malignant neoplasm of esophagus: Secondary | ICD-10-CM | POA: Insufficient documentation

## 2024-01-03 DIAGNOSIS — Z79899 Other long term (current) drug therapy: Secondary | ICD-10-CM | POA: Diagnosis not present

## 2024-01-03 DIAGNOSIS — R0789 Other chest pain: Secondary | ICD-10-CM | POA: Diagnosis not present

## 2024-01-03 DIAGNOSIS — E119 Type 2 diabetes mellitus without complications: Secondary | ICD-10-CM | POA: Diagnosis not present

## 2024-01-03 DIAGNOSIS — R079 Chest pain, unspecified: Secondary | ICD-10-CM | POA: Diagnosis present

## 2024-01-03 LAB — CBC
HCT: 32.5 % — ABNORMAL LOW (ref 39.0–52.0)
Hemoglobin: 10.8 g/dL — ABNORMAL LOW (ref 13.0–17.0)
MCH: 27.5 pg (ref 26.0–34.0)
MCHC: 33.2 g/dL (ref 30.0–36.0)
MCV: 82.7 fL (ref 80.0–100.0)
Platelets: 260 K/uL (ref 150–400)
RBC: 3.93 MIL/uL — ABNORMAL LOW (ref 4.22–5.81)
RDW: 17.8 % — ABNORMAL HIGH (ref 11.5–15.5)
WBC: 10.4 K/uL (ref 4.0–10.5)
nRBC: 0 % (ref 0.0–0.2)

## 2024-01-03 LAB — COMPREHENSIVE METABOLIC PANEL WITH GFR
ALT: 13 U/L (ref 0–44)
AST: 16 U/L (ref 15–41)
Albumin: 2.8 g/dL — ABNORMAL LOW (ref 3.5–5.0)
Alkaline Phosphatase: 93 U/L (ref 38–126)
Anion gap: 12 (ref 5–15)
BUN: 12 mg/dL (ref 8–23)
CO2: 22 mmol/L (ref 22–32)
Calcium: 8 mg/dL — ABNORMAL LOW (ref 8.9–10.3)
Chloride: 106 mmol/L (ref 98–111)
Creatinine, Ser: 0.74 mg/dL (ref 0.61–1.24)
GFR, Estimated: >60 mL/min (ref >60)
Glucose, Bld: 141 mg/dL — ABNORMAL HIGH (ref 70–99)
Potassium: 3.3 mmol/L — ABNORMAL LOW (ref 3.5–5.1)
Sodium: 140 mmol/L (ref 135–145)
Total Bilirubin: 1.1 mg/dL (ref 0.0–1.2)
Total Protein: 6.1 g/dL — ABNORMAL LOW (ref 6.5–8.1)

## 2024-01-03 LAB — TROPONIN I (HIGH SENSITIVITY)
Troponin I (High Sensitivity): 3 ng/L (ref <18)
Troponin I (High Sensitivity): 4 ng/L (ref <18)

## 2024-01-03 MED ORDER — IOHEXOL 350 MG/ML SOLN
75.0000 mL | Freq: Once | INTRAVENOUS | Status: AC | PRN
  Administered 2024-01-03: 75 mL via INTRAVENOUS

## 2024-01-03 NOTE — ED Notes (Signed)
 CCMD called.

## 2024-01-03 NOTE — ED Triage Notes (Signed)
 Pt bib [facility] from home c/o substernal cp starting this morning. Upon EMS arrival CP was gone. Initially pt a little clammy, skin dry on arrival. 324 Aspirin  given with EMS. Esophogeal dilation two days ago.   20G LFA 106 HR 128/80

## 2024-01-03 NOTE — ED Provider Notes (Signed)
 [place] [facility]GENC[facility] DEPARTMENT AT [facility] Provider Note   CSN: [id number] Arrival date & time: 01/03/24  9295     Patient presents with: Chest Pain   Melvin Hudson is a 68 y.o. male.   Pt is a 68 yo male with pmhx significant for HTN, obesity, angioedema, gout, arthritis, HLD, DM2, hx DVT (on Eliquis ), and esophageal cancer.  Pt had an esophageal dilation on 11/14.  He developed cp this am, but it's gone now.  No sob.  He did hold his Eliquis  on 11/14.        Prior to Admission medications   Medication Sig Start Date End Date Taking? Authorizing Provider  acetaminophen  (TYLENOL ) 500 MG tablet Take 1,000 mg by mouth 2 (two) times daily.    [provider]  albuterol  (VENTOLIN  HFA) 108 (90 Base) MCG/ACT inhaler Inhale 2 puffs into the lungs every 4 (four) hours as needed for wheezing (Cough). 04/02/23   [provider]  allopurinol  (ZYLOPRIM ) 300 MG tablet Place 300 mg into feeding tube daily as needed (Gout).    [provider]  atorvastatin  (LIPITOR) 40 MG tablet Take 1 tablet (40 mg total) by mouth at bedtime. Patient taking differently: Place 40 mg into feeding tube at bedtime. 10/07/19 01/01/24  [name], [name], DO  bismuth subsalicylate (PEPTO BISMOL) 262 MG/15ML suspension Place 30 mLs into feeding tube every 6 (six) hours as needed for indigestion or diarrhea or loose stools.    [provider]  Blood Glucose Monitoring Suppl (ONETOUCH VERIO FLEX SYSTEM) w/Device KIT USE TO CHECK BLOOD SUGAR TWICE DAILY 04/21/19   [provider]  calcium  carbonate (TUMS - DOSED IN MG ELEMENTAL CALCIUM ) 500 MG chewable tablet Chew 2 tablets (400 mg of elemental calcium  total) by mouth 3 (three) times daily with meals. Patient taking differently: Place 400 mg of elemental calcium  into feeding tube daily as needed for indigestion or heartburn. 04/18/22   [name], DO  carvedilol  (COREG ) 3.125 MG tablet Place 3.125 mg into feeding tube 2  (two) times daily with a meal.    [provider]  cetirizine  (ZYRTEC ) 10 MG tablet Place 10 mg into feeding tube daily.    [provider]  clonazePAM  (KLONOPIN ) 1 MG disintegrating tablet Place 1 mg into feeding tube daily as needed (Anxiety). 03/10/22   [provider]  dapagliflozin  propanediol (FARXIGA ) 10 MG TABS tablet 10 mg daily. Feeding tube    [provider]  diphenhydrAMINE  (BENADRYL ) 25 MG tablet Take 1 tablet (25 mg total) by mouth every 6 (six) hours. Patient taking differently: Take 25 mg by mouth every 6 (six) hours as needed for itching. 10/29/13   [name], MD  ELIQUIS  5 MG TABS tablet TAKE 1 TABLET(5 MG) BY MOUTH TWICE DAILY 11/09/23   [name], MD  famotidine  (PEPCID ) 40 MG/5ML suspension PLACE 2.5 MILLILITERS INTO THE FEEDING TUBE DAILY 12/31/23   [name], MD  gabapentin  (NEURONTIN ) 300 MG capsule 300 mg 3 (three) times daily. Feeding tube 01/19/18   [provider]  HYDROcodone -acetaminophen  (HYCET) 7.5-325 mg/15 ml solution Take 15 mLs by mouth 4 (four) times daily as needed for moderate pain (pain score 4-6). Patient not taking: Reported on 12/29/2023 08/31/23   Boscia, Heather E, NP  hydrocortisone  (ANUSOL -HC) 2.5 % rectal cream Place 1 Application rectally daily as needed for hemorrhoids or anal itching. 04/29/23   [provider]  isosorbide -hydrALAZINE  (BIDIL ) 20-37.5 MG tablet Take 1 tablet by mouth in the morning and at  bedtime. 04/20/23   [name], [name], DO  lactose free nutrition (BOOST) LIQD Take 237 mLs by mouth daily.    [provider]  lidocaine  (XYLOCAINE ) 2 % solution MIX 1 PART(5ML) OF LIDOCAINE  AND 1 PART WATER ( ). SWALLOW OF DILUTED MIXTURE 30 MINUTES BEFORE MEALS AND BEDTIME FOR UP TO FOUR TIMES DAILY Patient not taking: Reported on 12/29/2023 10/20/23   [name], MD  lidocaine -prilocaine (EMLA) cream Apply 1 Application topically as needed. Patient not taking: Reported on 12/29/2023  11/30/23   [name], MD  nystatin  (MYCOSTATIN ) 100000 UNIT/ML suspension Take 5 mLs (500,000 Units total) by mouth 4 (four) times daily. Patient taking differently: Take 5 mLs by mouth 3 (three) times daily as needed (Thrush). 11/25/23   [name], MD  ondansetron  (ZOFRAN ) 8 MG tablet Take 1 tablet (8 mg total) by mouth every 8 (eight) hours as needed for nausea or vomiting. 11/30/23   [name], MD  [facility] VERIO test strip USE TO CHECK BLOOD SUGAR TWICE DAILY 07/20/19   [provider]  polyethylene glycol (MIRALAX  / GLYCOLAX ) 17 g packet Take 17 g by mouth daily. Patient taking differently: Place 17 g into feeding tube daily as needed for mild constipation or moderate constipation. 04/19/22   [name], DO  prochlorperazine  (COMPAZINE ) 10 MG tablet Take 1 tablet (10 mg total) by mouth every 6 (six) hours as needed for nausea or vomiting. 11/30/23   [name], MD  Protein (FEEDING SUPPLEMENT, PROSOURCE TF20,) liquid Place 60 mLs into feeding tube 2 (two) times daily. Patient not taking: Reported on 12/29/2023 06/17/23   [name], MD  psyllium (HYDROCIL/METAMUCIL) 95 % PACK Take 1 packet by mouth daily. Patient taking differently: Take 1 packet by mouth daily as needed for mild constipation or moderate constipation. 04/15/22   [name], DO  Water  For Irrigation, Sterile (FREE WATER ) SOLN Place 100 mLs into feeding tube every 6 (six) hours. 06/17/23   [name], MD    Allergies: Azilsartan and Advil [ibuprofen]    Review of Systems  Cardiovascular:  Positive for chest pain.  All other systems reviewed and are negative.   Updated Vital Signs BP (!) 148/89   Pulse 81   Temp 98.8 F (37.1 C) (Oral)   Resp (!) 28   Ht 6' 2 (1.88 m)   Wt 112.5 kg   SpO2 97%   BMI 31.84 kg/m   Physical Exam Vitals and nursing note reviewed.  Constitutional:      Appearance: He is well-developed.  HENT:     Head: Normocephalic and atraumatic.  Eyes:     Extraocular  Movements: Extraocular movements intact.     Pupils: Pupils are equal, round, and reactive to light.  Cardiovascular:     Rate and Rhythm: Normal rate and regular rhythm.     Heart sounds: Normal heart sounds.  Pulmonary:     Effort: Pulmonary effort is normal.     Breath sounds: Normal breath sounds.  Abdominal:     General: Bowel sounds are normal.     Palpations: Abdomen is soft.  Musculoskeletal:        General: Normal range of motion.     Cervical back: Normal range of motion and neck supple.  Skin:    General: Skin is warm.     Capillary Refill: Capillary refill takes less than 2 seconds.  Neurological:     General: No focal deficit present.     Mental Status: He is alert and oriented to  person, place, and time.  Psychiatric:        Mood and Affect: Mood normal.        Behavior: Behavior normal.     (all labs ordered are listed, but only abnormal results are displayed) Labs Reviewed  CBC - Abnormal; Notable for the following components:      Result Value   RBC 3.93 (*)    Hemoglobin 10.8 (*)    HCT 32.5 (*)    RDW 17.8 (*)    All other components within normal limits  COMPREHENSIVE METABOLIC PANEL WITH GFR - Abnormal; Notable for the following components:   Potassium 3.3 (*)    Glucose, Bld 141 (*)    Calcium  8.0 (*)    Total Protein 6.1 (*)    Albumin 2.8 (*)    All other components within normal limits  TROPONIN I (HIGH SENSITIVITY)  TROPONIN I (HIGH SENSITIVITY)    EKG: EKG Interpretation Date/Time:  Sunday January 03 2024 07:16:40 EST Ventricular Rate:  92 PR Interval:  214 QRS Duration:  113 QT Interval:  349 QTC Calculation: 432 R Axis:   -79  Text Interpretation: Sinus rhythm Borderline prolonged PR interval Left anterior fascicular block Consider right ventricular hypertrophy No significant change since last tracing Confirmed by Dean Clarity [phone]) on 01/03/2024 8:12:45 AM  Radiology: CT Angio Chest PE W and/or Wo Contrast Result Date:  01/03/2024 EXAM: CTA CHEST 01/03/2024 11:44:04 AM TECHNIQUE: CTA of the chest was performed without and with the administration of 75 mL of iohexol  (OMNIPAQUE ) 350 MG/ML intravenous contrast. Multiplanar reformatted images are provided for review. MIP images are provided for review. Automated exposure control, iterative reconstruction, and/or weight based adjustment of the mA/kV was utilized to reduce the radiation dose to as low as reasonably achievable. COMPARISON: 04/28/2023 CLINICAL HISTORY: Pulmonary embolism (PE) suspected, high prob. FINDINGS: PULMONARY ARTERIES: Pulmonary arteries are adequately opacified for evaluation. No acute pulmonary embolus. Main pulmonary artery is normal in caliber. MEDIASTINUM: The heart and pericardium demonstrate no acute abnormality. Atheromatous calcifications of the aorta and coronary arteries. Proximal thoracic esophageal wall thickening consistent with history of esophageal cancer. LYMPH NODES: Confluent mediastinal adenopathy extending from the AP window to the lower paratracheal region consistent with progression of adenopathy this can be correlated with repeat PET scan. LUNGS AND PLEURA: The lungs are without acute process. No focal consolidation or pulmonary edema. No evidence of pleural effusion or pneumothorax. UPPER ABDOMEN: Limited images of the upper abdomen are unremarkable. SOFT TISSUES AND BONES: No acute bone or soft tissue abnormality. IMPRESSION: 1. No pulmonary embolism 2. Proximal thoracic esophageal wall thickening consistent with history of esophageal cancer 3. Confluent mediastinal adenopathy from the AP window to the lower paratracheal region, consistent with progressive nodal disease; consider repeat PET/CT for further assessment Electronically signed by: Fonda Field MD 01/03/2024 11:52 AM EST RP Workstation: [id number]   DG Chest Port 1 View Result Date: 01/03/2024 CLINICAL DATA:  Chest pain EXAM: PORTABLE CHEST 1 VIEW COMPARISON:  08/14/2023  FINDINGS: The cardiopericardial silhouette is within normal limits for size. Interstitial markings are diffusely coarsened with chronic features. The lungs are clear without focal pneumonia, edema, pneumothorax or pleural effusion. Right Port-A-Cath remains in place. Telemetry leads overlie the chest. IMPRESSION: Chronic interstitial coarsening without acute cardiopulmonary findings. Electronically Signed   By: Camellia Candle M.D.   On: 01/03/2024 07:36     Procedures   Medications Ordered in the ED  iohexol  (OMNIPAQUE ) 350 MG/ML injection 75 mL (75 mLs Intravenous Contrast  Given 01/03/24 1144)                                    Medical Decision Making Amount and/or Complexity of Data Reviewed Labs: ordered. Radiology: ordered.  Risk Prescription drug management.   This patient presents to the ED for concern of cp, this involves an extensive number of treatment options, and is a complaint that carries with it a high risk of complications and morbidity.  The differential diagnosis includes cardiac, pulm, gi   Co morbidities that complicate the patient evaluation  HTN, obesity, angioedema, gout, arthritis, HLD, DM2, hx DVT (on Eliquis ), and esophageal cancer   Additional history obtained:  Additional history obtained from epic chart review External records from outside source obtained and reviewed including EMS report   Lab Tests:  I Ordered, and personally interpreted labs.  The pertinent results include:  cbc with hgb 10.8 (stable); cmp nl; trop nl times 2   Imaging Studies ordered:  I ordered imaging studies including cxr and cta  I independently visualized and interpreted imaging which showed  CXR: Chronic interstitial coarsening without acute cardiopulmonary  findings.  CT chest: No pulmonary embolism  2. Proximal thoracic esophageal wall thickening consistent with history of  esophageal cancer  3. Confluent mediastinal adenopathy from the AP window to the lower   paratracheal region, consistent with progressive nodal disease; consider repeat  PET/CT for further assessment   I agree with the radiologist interpretation   Cardiac Monitoring:  The patient was maintained on a cardiac monitor.  I personally viewed and interpreted the cardiac monitored which showed an underlying rhythm of: nsr   Medicines ordered and prescription drug management:   I have reviewed the patients home medicines and have made adjustments as needed   Test Considered:  ct   Problem List / ED Course:  CP:  no cp since arrival.  Cardiac work up neg.  I think he can go home and f/u with his cardiologist, Dr. Ladona.  No PE  on CT.  No sign of esophageal rupture from recent dilation procedure.     Reevaluation:  After the interventions noted above, I reevaluated the patient and found that they have :improved   Social Determinants of Health:  Lives at home   Dispostion:  After consideration of the diagnostic results and the patients response to treatment, I feel that the patent would benefit from discharge with outpatient f/u.       Final diagnoses:  Atypical chest pain    ED Discharge Orders          Ordered    Ambulatory referral to Cardiology        01/03/24 1158               Dean Clarity, MD 01/03/24 1201

## 2024-01-04 ENCOUNTER — Ambulatory Visit: Attending: Physician Assistant | Admitting: Physician Assistant

## 2024-01-04 ENCOUNTER — Inpatient Hospital Stay

## 2024-01-04 ENCOUNTER — Encounter: Payer: Self-pay | Admitting: Physician Assistant

## 2024-01-04 VITALS — BP 120/82 | HR 80 | Ht 74.0 in | Wt 244.4 lb

## 2024-01-04 DIAGNOSIS — R0789 Other chest pain: Secondary | ICD-10-CM | POA: Diagnosis not present

## 2024-01-04 DIAGNOSIS — E876 Hypokalemia: Secondary | ICD-10-CM | POA: Diagnosis not present

## 2024-01-04 DIAGNOSIS — Z86718 Personal history of other venous thrombosis and embolism: Secondary | ICD-10-CM

## 2024-01-04 DIAGNOSIS — I5032 Chronic diastolic (congestive) heart failure: Secondary | ICD-10-CM | POA: Diagnosis not present

## 2024-01-04 DIAGNOSIS — R000 Tachycardia, unspecified: Secondary | ICD-10-CM

## 2024-01-04 DIAGNOSIS — I77819 Aortic ectasia, unspecified site: Secondary | ICD-10-CM

## 2024-01-04 DIAGNOSIS — D649 Anemia, unspecified: Secondary | ICD-10-CM

## 2024-01-04 NOTE — Patient Instructions (Signed)
 Medication Instructions:  Your physician recommends that you continue on your current medications as directed. Please refer to the Current Medication list given to you today. *If you need a refill on your cardiac medications before your next appointment, please call your pharmacy*  Lab Work: TODAY-BMET, HEMATOCRIT, HEMOGLOBIN, MAG & TSH If you have labs (blood work) drawn today and your tests are completely normal, you will receive your results only by: MyChart Message (if you have MyChart) OR A paper copy in the mail If you have any lab test that is abnormal or we need to change your treatment, we will call you to review the results.  Testing/Procedures: Your physician has requested that you have an echocardiogram. Echocardiography is a painless test that uses sound waves to create images of your heart. It provides your doctor with information about the size and shape of your heart and how well your heart's chambers and valves are working. This procedure takes approximately one hour. There are no restrictions for this procedure. Please do NOT wear cologne, perfume, aftershave, or lotions (deodorant is allowed). Please arrive 15 minutes prior to your appointment time.  Please note: We ask at that you not bring children with you during ultrasound (echo/ vascular) testing. Due to room size and safety concerns, children are not allowed in the ultrasound rooms during exams. Our front office staff cannot provide observation of children in our lobby area while testing is being conducted. An adult accompanying a patient to their appointment will only be allowed in the ultrasound room at the discretion of the ultrasound technician under special circumstances. We apologize for any inconvenience.  Follow-Up: At [facility], you and your health needs are our priority.  As part of our continuing mission to provide you with exceptional heart care, our providers are all part of one team.  This team  includes your primary Cardiologist (physician) and Advanced Practice Providers or APPs (Physician Assistants and Nurse Practitioners) who all work together to provide you with the care you need, when you need it.  Your next appointment:   6-7 week(s)  Provider:   Madonna Large, DO or Dayna Dunn, PA   We recommend signing up for the patient portal called MyChart.  Sign up information is provided on this After Visit Summary.  MyChart is used to connect with patients for Virtual Visits (Telemedicine).  Patients are able to view lab/test results, encounter notes, upcoming appointments, etc.  Non-urgent messages can be sent to your provider as well.   To learn more about what you can do with MyChart, go to [email or web address].   Other Instructions

## 2024-01-04 NOTE — Progress Notes (Signed)
 Nutrition Follow-up:  Called at request of patient (see earlier note from today)  Patient with esophageal cancer, receiving nivolumab (started on 11/30/23).  Completed chemotherapy and radiation on 08/03/23.  Has a PEG tube.  Biopsy performed on 10/30/23 negative for residual tumor.  Due for repeat scan in January. Noted seen in ED yesterday with chest pain with negative cardiac workup.  Esophageal dilation on 01/01/24.  Spoke with patient via phone.  Reports that since September after biopsy has been having dysphagia.  Was down to 1 carton of tube feeding but has increased to 3 cartons of nutren 1.5 (1 carton at 5am, noon and 5pm) recently via tube.  Flushes with 2 syringes of water  after feeding.  Does not feel like his swallowing has improved following dilation on 11/14.  Tried to eat chicken noodle soup after dilation and got choked up.  Says that he is drinking liquids without difficulty (juice, V-8 splash, water ).     Medications: reviewed  Labs: reviewed  Anthropometrics:   Weight 244 lb 6.4 oz today at cardiology 272 lb 8 oz on 9/11 260 lb 7/31  10% weight loss in the last 2 months, significant   Estimated Energy Needs  Kcals: 2400-2700 Protein: 120-135 g Fluid: > 2400 ml  NUTRITION DIAGNOSIS: Unintentional weight loss related to decreased oral intake as evidenced by 10% weight loss in the last 2 month   INTERVENTION:  Discussed increasing nutren 1.5 via tube by 2 cartons for total of 5 cartons per day or can continue 3 cartons via tube and drink ensure plus/boost plus orally times 2 for more calories and protein.  Discussed if patient wants to increase tube feeding can give 1 carton at 6 am (already doing this), 1 carton at 9 am, 1 carton at noon (already doing), 1 carton at 3 pm and 1 carton at 6 pm (already doing).  Flush with 60 ml of water  before and after each feeding.   Provides 1875 calories, 85 g protein, 1555 ml free water .   Patient is drinking oral liquids without  difficulty at this time.   Has follow up with SLP on 11/24.  Oral intake deferred to SLP and GI  MONITORING, EVALUATION, GOAL: weight trends, intake, tube feeding   NEXT VISIT: Tuesday, Nov 25 in clinic for weight check, tube feeding tolerance/further adjustments  Mabry Santarelli B. Dasie SOLON, CSO, LDN Registered Dietitian [phone]

## 2024-01-04 NOTE — Progress Notes (Signed)
 Cardiology Office Note    Date:  01/04/2024  ID:  Melvin Hudson, DOB 05-Dec-1955, MRN [id number] PCP:  Haze Kingfisher, MD  Cardiologist:  Madonna Large, DO  Electrophysiologist:  None   Chief Complaint: 6 month f/u, ER f/u chest pain  History of Present Illness: .    Melvin Hudson is a 68 y.o. male with visit-pertinent history of chronic HFpEF, cath 2012 with normal cors but slow filling evident, acute DVT 05/2023 managed by oncology, dilation of aorta, OSA on CPAP, GIB requiring transfusion, hypertension, HLD, intermittent RBBB by EKG, diabetes mellitus, former tobacco use, throat CA, dysphagia on tube feeds, esophageal stricture requiring dilation, malnutrition due to esophageal CA, anemia seen for follow-up. Remote cath 2012 showed normal coronaries, though slow flow filling evident suggestive of possible microvascular disease. Nuclear stress test 2020 showed decreased tracer uptake in the basal inferior and mid inferior segments of the left ventricle, likely due to tissue attenuation artifact (BMI 42), ischemia in this region cannot be excluded, low risk study. Last echo 03/2022 EF 50-55%, G1DD, mildly dilated aortic root and ascending aorta. He has had prior admissions in 2024 for recurrent diverticular bleed requiring blood transfusions. He had LLE DVT identified in 05/2023 with ongoing treatment by oncology. Blood pressure medication previously had to be cut way back for soft BP in the setting of weight loss and malnutrition. He had esophageal dilation by ENT on 01/01/24.   He was seen in the ED yesterday with atypical chest pain. He had awoke at 3am and felt a pressure-like chest discomfort that would come and go for a few seconds the resolve before recurring. No radiation of symptoms. He felt clammy. No SOB, palpitations, dizziness or syncope. EMS came out and EKG showed sinus tach in the 1teens. He was unaware of any heart racing. While in the ED he reports he was still having some chest  discomfort. Symptoms had lasted for several hours at that point with negative troponins. H/H showed slight downtrend and potassium was low. CTA negative for PE, + Proximal thoracic esophageal wall thickening consistent with history of esophageal cancer, confluent mediastinal adenopathy from the AP window to the lower paratracheal region, consistent with progressive nodal disease; consider repeat PET/CT for further assessment.  He returns for follow-up today and denies any recurrent CP. He does note that he feels that his esophageal stretching did not work as he tried to eat chicken noodle soup and had to cough and sputter. No recent orthopnea, PND, edema. BP well controlled.   Labwork independently reviewed: 12/2023 K 3.3, Cr 0.74, alb 2.8, AST ALT OK, Hgb 10.8, plt 260 11/2023 TSH OK  ROS: .    Please see the history of present illness. All other systems are reviewed and otherwise negative.  Studies Reviewed: SABRA    EKG:  EKG is not ordered today due to tracing obtained in ER yesterday which was personally reviewed, demonstrating  NSR 92bpm, LAFB, poor R Wave progression, no acute STT changes. QRS more narrow compared to prior.  CV Studies: Cardiac studies reviewed are outlined and summarized above. Otherwise please see EMR for full report.   Current Reported Medications:.    Current Meds  Medication Sig   albuterol  (VENTOLIN  HFA) 108 (90 Base) MCG/ACT inhaler Inhale 2 puffs into the lungs every 4 (four) hours as needed for wheezing (Cough).   allopurinol  (ZYLOPRIM ) 300 MG tablet Place 300 mg into feeding tube daily as needed (Gout).   atorvastatin  (LIPITOR) 40 MG tablet Take 1 tablet (  40 mg total) by mouth at bedtime. (Patient taking differently: Place 40 mg into feeding tube at bedtime.)   bismuth subsalicylate (PEPTO BISMOL) 262 MG/15ML suspension Place 30 mLs into feeding tube every 6 (six) hours as needed for indigestion or diarrhea or loose stools.   Blood Glucose Monitoring Suppl  (ONETOUCH VERIO FLEX SYSTEM) w/Device KIT USE TO CHECK BLOOD SUGAR TWICE DAILY   calcium  carbonate (TUMS - DOSED IN MG ELEMENTAL CALCIUM ) 500 MG chewable tablet Chew 2 tablets (400 mg of elemental calcium  total) by mouth 3 (three) times daily with meals. (Patient taking differently: Place 400 mg of elemental calcium  into feeding tube daily as needed for indigestion or heartburn.)   diphenhydrAMINE  (BENADRYL ) 25 MG tablet Take 1 tablet (25 mg total) by mouth every 6 (six) hours. (Patient taking differently: Take 25 mg by mouth every 6 (six) hours as needed for itching.)   ELIQUIS  5 MG TABS tablet TAKE 1 TABLET(5 MG) BY MOUTH TWICE DAILY   famotidine  (PEPCID ) 40 MG/5ML suspension PLACE 2.5 MILLILITERS INTO THE FEEDING TUBE DAILY   gabapentin  (NEURONTIN ) 300 MG capsule 300 mg 3 (three) times daily. Feeding tube   HYDROcodone -acetaminophen  (HYCET) 7.5-325 mg/15 ml solution Take 15 mLs by mouth 4 (four) times daily as needed for moderate pain (pain score 4-6).   hydrocortisone  (ANUSOL -HC) 2.5 % rectal cream Place 1 Application rectally daily as needed for hemorrhoids or anal itching.   isosorbide -hydrALAZINE  (BIDIL ) 20-37.5 MG tablet TAKE 1 TABLET BY MOUTH IN THE MORNING AND AT BEDTIME   lidocaine  (XYLOCAINE ) 2 % solution MIX 1 PART(5ML) OF LIDOCAINE  AND 1 PART WATER ( ). SWALLOW OF DILUTED MIXTURE 30 MINUTES BEFORE MEALS AND BEDTIME FOR UP TO FOUR TIMES DAILY   nystatin  (MYCOSTATIN ) 100000 UNIT/ML suspension Take 5 mLs (500,000 Units total) by mouth 4 (four) times daily.   ondansetron  (ZOFRAN ) 8 MG tablet Take 1 tablet (8 mg total) by mouth every 8 (eight) hours as needed for nausea or vomiting.   ONETOUCH VERIO test strip USE TO CHECK BLOOD SUGAR TWICE DAILY   polyethylene glycol (MIRALAX  / GLYCOLAX ) 17 g packet Take 17 g by mouth daily. (Patient taking differently: Place 17 g into feeding tube daily as needed for mild constipation or moderate constipation.)   prochlorperazine  (COMPAZINE ) 10 MG  tablet Take 1 tablet (10 mg total) by mouth every 6 (six) hours as needed for nausea or vomiting.   Protein (FEEDING SUPPLEMENT, PROSOURCE TF20,) liquid Place 60 mLs into feeding tube 2 (two) times daily.   psyllium (HYDROCIL/METAMUCIL) 95 % PACK Take 1 packet by mouth daily. (Patient taking differently: Take 1 packet by mouth daily as needed for mild constipation or moderate constipation.)   Water  For Irrigation, Sterile (FREE WATER ) SOLN Place 100 mLs into feeding tube every 6 (six) hours.    Physical Exam:    VS:  BP 120/82 (BP Location: Left Arm, Patient Position: Sitting, Cuff Size: Normal)   Pulse 80   Ht 6' 2 (1.88 m)   Wt 244 lb 6.4 oz (110.9 kg)   SpO2 95%   BMI 31.38 kg/m    Wt Readings from Last 3 Encounters:  01/04/24 244 lb 6.4 oz (110.9 kg)  01/03/24 248 lb (112.5 kg)  01/01/24 248 lb (112.5 kg)    GEN: Well nourished, well developed in no acute distress NECK: No JVD; No carotid bruits CARDIAC: RRR, no murmurs, rubs, gallops RESPIRATORY:  Clear to auscultation without rales, wheezing or rhonchi  ABDOMEN: Soft, non-tender, non-distended EXTREMITIES:  No edema;  No acute deformity   Asessement and Plan:.    1. Atypical chest pain, sinus tachycardia - etiology of episode of CP unclear, lasted on/off for several hours with mixed features. EKG by EMS showed sinus tach, improved to HR 92 by ER visit. Troponins negative despite prolonged episode of discomfort. Patient has history of normal cors by cath 2012 and possibly atypical nuc in 2020 managed medically. He is not an ideal candidate for aggressive invasive workup given ongoing issues with esophageal CA, episodic issues with bleeding of his feeding tube (advised to discuss with team that manages this), anemia, prior recurrent diverticular GI bleeding requiring transfusions, and ongoing concomitant anticoagulation with Eliquis . Will obtain echocardiogram to start to ensure LV function stable. We discussed options of pursuing  updated stress test (favor cardiac PET if chosen) versus starting with echocardiogram/observation for recurrent symptoms with continuation of medical therapy with beta blocker and nitrate. Per shared decision making, will start with the latter, but he will notify for any recurrent symptoms. Will also get f/u H/H given recent observation of recurrent anemia, as well as TSH given sinus tachycardia. I will send a copy of this note to his oncologist as his CT scan also raised question of progressive nodal disease recommended attention to follow-up imaging. Also encouraged he reach out to the team that did his esophageal dilation to notify for continued issues with dysphagia.  2. Chronic HFpEF, HTN - appears euvolemic. No pleural fluid or signs of overload on imaging yesterday. On Farxiga  10mg  daily, continue. Not requiring standing loop diuretic. Continue carvedilol  3.125mg  BID, Bidil  1 tablet twice daily as prescribed.  3. Hypokalemia - ? Nutritional. Will check BMET/Mg. If electrolytes are low, may need to coordinate with oncology to follow/manage in context of his esophageal CA.  4. History of DVT, ongoing anemia by labs - anticoagulation plan per oncology. Recent Hgb showed slight recurrent downtrend, will f/u value today.  5. Mild dilation of aorta - had CTA in the hospital yesterday, no commentary of abnormal dimension. Will hold off formal CT chest wo contrast as previously ordered given this updated study. Obtain echocardiogram and follow aortic dimension for comparison.     Disposition: F/u with me or Dr. Michele in 2 months.  Signed, Tregan Read N Harshika Mago, PA-C

## 2024-01-04 NOTE — Anesthesia Postprocedure Evaluation (Signed)
 Anesthesia Post Note  Patient: Melvin Hudson  Procedure(s) Performed: DILATION, ESOPHAGUS (Throat)     Patient location during evaluation: PACU Anesthesia Type: General Level of consciousness: awake Pain management: pain level controlled Vital Signs Assessment: post-procedure vital signs reviewed and stable Respiratory status: spontaneous breathing, nonlabored ventilation and respiratory function stable Cardiovascular status: blood pressure returned to baseline and stable Postop Assessment: no apparent nausea or vomiting Anesthetic complications: no   No notable events documented.  Last Vitals:  Vitals:   01/01/24 0900 01/01/24 0911  BP: (!) 143/78   Pulse: 77 77  Resp: (!) 21 (!) 21  Temp:  36.5 C  SpO2: 92% 94%    Last Pain:  Vitals:   01/01/24 0822  TempSrc:   PainSc: 0-No pain                 Dahl Higinbotham P Emrah Ariola

## 2024-01-04 NOTE — Progress Notes (Addendum)
 Nutrition  Patient with nutrition appointment on 12/8 but RD will be out of office.  Nutrition appointment moved to 12/16 after radiation follow-up.  Called and talked to patient and let him know about the change.    While on the phone with patient, patient requested that RD call him back later today because he is not eating much by mouth.  Patient was currently getting labs drawn.    Melvin Hudson B. Dasie SOLON, CSO, LDN Registered Dietitian [phone]

## 2024-01-05 ENCOUNTER — Other Ambulatory Visit: Payer: Self-pay

## 2024-01-05 ENCOUNTER — Ambulatory Visit: Payer: Self-pay | Admitting: Physician Assistant

## 2024-01-05 ENCOUNTER — Encounter: Payer: Self-pay | Admitting: Hematology

## 2024-01-05 LAB — BASIC METABOLIC PANEL WITH GFR
BUN/Creatinine Ratio: 19 (ref 10–24)
BUN: 13 mg/dL (ref 8–27)
CO2: 28 mmol/L (ref 20–29)
Calcium: 9.6 mg/dL (ref 8.6–10.2)
Chloride: 99 mmol/L (ref 96–106)
Creatinine, Ser: 0.7 mg/dL — ABNORMAL LOW (ref 0.76–1.27)
Glucose: 90 mg/dL (ref 70–99)
Potassium: 4.2 mmol/L (ref 3.5–5.2)
Sodium: 144 mmol/L (ref 134–144)
eGFR: 100 mL/min/1.73 (ref >59)

## 2024-01-05 LAB — MAGNESIUM: Magnesium: 2.2 mg/dL (ref 1.6–2.3)

## 2024-01-05 LAB — TSH: TSH: 2.17 u[IU]/mL (ref 0.450–4.500)

## 2024-01-05 LAB — HEMOGLOBIN AND HEMATOCRIT, BLOOD
Hematocrit: 35.7 % — ABNORMAL LOW (ref 37.5–51.0)
Hemoglobin: 12.1 g/dL — ABNORMAL LOW (ref 13.0–17.7)

## 2024-01-11 ENCOUNTER — Ambulatory Visit: Attending: Radiation Oncology

## 2024-01-11 DIAGNOSIS — R1312 Dysphagia, oropharyngeal phase: Secondary | ICD-10-CM | POA: Insufficient documentation

## 2024-01-11 NOTE — Therapy (Signed)
 OUTPATIENT SPEECH LANGUAGE PATHOLOGY SWALLOW TREATMENT   Patient Name: Melvin Hudson MRN: [id number] DOB:August 11, 1955, 68 y.o., male Today's Date: 01/11/2024  PCP: Haze Kingfisher, MD REFERRING PROVIDER: [name], MD  END OF SESSION:  End of Session - 01/11/24 0806     Visit Number 5    Number of Visits 7    Date for Recertification  02/11/24    Authorization - Number of Visits 4    SLP Start Time 0804    Activity Tolerance Patient tolerated treatment well            Past Medical History:  Diagnosis Date   Angioedema 09/17/2011   tongue and lips   Arthritis    both of my legs and feet (01/05/2014)   CHF (congestive heart failure) (HCC)    Diabetes mellitus without complication (HCC)    type 2   DVT (deep venous thrombosis) (HCC) 06/16/2023   LLE   Dysphagia    Erectile dysfunction 04/20/2018   Esophageal cancer (HCC)    GI bleed    Heart murmur 04/20/2018   History of radiation therapy    06/24/2023- 08/03/2023 Dr. 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   Past Surgical History:  Procedure Laterality Date   COLONOSCOPY Left 04/11/2014   Procedure: COLONOSCOPY;  Surgeon: Elsie Cree, MD;  Location: [facility] ENDOSCOPY;  Service: Endoscopy;  Laterality: Left;   COLONOSCOPY N/A 04/20/2014   Procedure: COLONOSCOPY;  Surgeon: Jerrell KYM Sol, MD;  Location: [facility] ENDOSCOPY;  Service: Endoscopy;  Laterality: N/A;   COLONOSCOPY W/ BIOPSIES AND POLYPECTOMY     benign   DIRECT LARYNGOSCOPY  01/05/2014   DIRECT LARYNGOSCOPY N/A 01/05/2014   Procedure: DIRECT LARYNGOSCOPY AND BIOPSY;  Surgeon: Ida Loader, MD;  Location: [facility] OR;  Service: ENT;  Laterality: N/A;   ESOPHAGEAL DILATION N/A 01/01/2024   Procedure: DILATION, ESOPHAGUS;  Surgeon: Loader Ida, MD;  Location: [facility] OR;  Service: ENT;  Laterality: N/A;   ESOPHAGOSCOPY WITH DILITATION N/A 05/27/2023   Procedure: ESOPHAGOSCOPY;  Surgeon: Loader Ida, MD;  Location: [facility] OR;  Service: ENT;  Laterality: N/A;   ESOPHAGOSCOPY WITH DILITATION N/A 10/30/2023   Procedure: ESOPHAGOSCOPY, WITH BIOPSY;  Surgeon: Loader Ida, MD;  Location: [facility] OR;  Service: ENT;  Laterality: N/A;  With possible biopsy   IR GASTROSTOMY TUBE MOD SED  06/12/2023   IR IMAGING GUIDED PORT INSERTION  11/24/2023   IR RADIOLOGIST EVAL & MGMT  08/10/2023   IR RADIOLOGIST EVAL & MGMT  08/14/2023   KNEE ARTHROSCOPY Right ~ 1994   LEFT AND RIGHT HEART CATHETERIZATION WITH CORONARY/GRAFT ANGIOGRAM N/A 12/31/2010   Procedure: LEFT AND RIGHT HEART CATHETERIZATION WITH EL BILE;  Surgeon: Erick JONELLE Bergamo, MD;  Location: [facility] CATH LAB;  Service: Cardiovascular;  Laterality: N/A;   MULTIPLE EXTRACTIONS WITH ALVEOLOPLASTY N/A 07/08/2013   Procedure: Extraction of tooth #'s 22, 27 with alveoloplasty and bilateal mandibular facial exostoses reductions;  Surgeon: Tanda JULIANNA Fanny, DDS;  Location: [facility] ORS;  Service: Oral Surgery;  Laterality: N/A;   MULTIPLE TOOTH EXTRACTIONS  ~ 2008   Patient Active Problem List   Diagnosis Date Noted   Malnutrition of moderate degree 06/12/2023   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  Dysphagia 03/02/2023   Hypocalcemia 04/17/2022   Anxiety state 04/15/2022   AKI (acute kidney injury) 04/15/2022   Chronic diastolic heart failure (HCC) 04/15/2022   Syncope 04/15/2022   Nonspecific abnormal electrocardiogram (ECG) (EKG) 04/15/2022   GI bleed 04/13/2022   PICC (peripherally inserted central catheter) flush 01/01/2022   Status post radiation therapy 12/05/2020   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   Edentulous 12/05/2020   Ill-fitting dentures 12/05/2020   Former smoker 11/11/2019   Healthcare maintenance 11/11/2019   Bilateral  leg edema 04/21/2018   Heart murmur 04/20/2018   Erectile dysfunction 04/20/2018   Pure hypercholesterolemia 04/20/2018   Obesity, Class III, BMI 40-49.9 (morbid obesity) (HCC)    BRBPR (bright red blood per rectum) 08/30/2016   Chronic pain syndrome    History of GI diverticular bleed 04/18/2014   Hematochezia 04/10/2014   Symptomatic anemia 04/08/2014   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  Pt accompanied by: self   PERTINENT HISTORY: See above. Tonsillar cancer 2015 Jakie thought rt-sided) treated with rad tx by Dr. Izell.   PAIN:  Are you having pain? Yes: NPRS scale: 4/10 Pain location: pt pointed to middle of breastbone and it goes to my back (pain at same level on sternum and on back Pain description: shooting, sore Aggravating factors: IDK Once it starts hitting me I quit. Relieving factors: it's always there  FALLS: Has patient fallen in last 6 months?  No  PATIENT GOALS: Find out what's going on  OBJECTIVE:  Note: Objective measures were completed at Evaluation unless otherwise noted. OBJECTIVE:   DIAGNOSTIC FINDINGS:  DG ESOPHAGUS Jan 2025: IMPRESSION: Moderate persistent narrowing of esophagus at cervicothoracic junction. It is uncertain if this is due to external compression or stricture or mass. Endoscopy is recommended as well as potentially CT scan of the  chest with intravenous contrast.   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  INSTRUMENTAL SWALLOW STUDY FINDINGS (MBSS) None noted  PATIENT REPORTED OUTCOME MEASURES (PROM): EAT-10: Pt completed 09/18/23 with EAT -10 score of 24/40 with higher scores indicating  pt's swallowing deficits negatively affect QoL.                                                                                                                             TREATMENT DATE:   01/11/24: Esophageal dilatation, and initiation of immunotherapy completed since last session. Pt  on immunotherapy at this time until Jan 2026 when another scan will be taken. Pt has tried many foods but has had pain in his esophagus radiating to his back with most POs. Pt had applesauce and water  today, with initial cough but none afterwards. No overt s/sx aspiration PNA. Pt with occasional squeaking sound 3/9 boluses of applesauce and pt said it felt like it wanted to come up a bit at those times. No coughing, but repeated swallowing x2-4 times during these instances. SLP had pt alternate between solids and liquids and this (reportedly) assisted pt's sx of esophageal regurgitation back into pharynx. SLP suggested pt alternate puree and liquid (room temp) at home whenever POs are  desired. If pt's oropharyngeal swallow looks safe next session d/c will be considered as SLP suspects pt's primary sx are esophageal in nature.  11/13/23: Pt and SLP agreed pt could again be seen in 8 weeks - cancelled appointment for October.  Pt with biopsy Tuesday 11/10/23 which was benign. Throat dilation scheduled for the next 2-4 weeks. He may also initiate immunotherapy, from Dr Demetra note. Today he had applesauce without any overt s/sx difficulty but when he ate cereal bar had what sounded like regurgitation of liquids, and pt reported he had to push it down. SLP strongly encouraged him to eat pureed or liquid foods (smoothies, soups). Pt is  still using PEG - 1/day.  With HEP pt cont to not complete regularly. SLP again provided rationale for pt. Pt req'd rare min A for the two exercises.  10/15/23: Pt to see about esophageal dilation 11/29/23. Pt eating cereal bars, chips, soups, cheese, mashed potatoes. Scrambled eggs and corn beef hash with potatoes - pt reports reduced pharyngeal clearance/residue. SLP suggested smoothies - pt will try these. With POs today, cereal bar and juice - no overt s/sx swallowing difficulty. With whole peanut butter crackers and juice pt had coughing and stated, I won't try that again. That's what I do. If I start coughing like that I just put it up. SLP affirmed pt's decision and educated that was sign of dysphagia, but had him try again with 1/3 of cracker bites and that smaller bites/sips might not bring the same symptoms. First bite was without overt s/sx, but second 1/3 cracker bite resulted in coughing and a strange squeaking sound. SLP suspects tight UES - possibly some of cracker bolus caught in UES (?).  Pt has not been performing HEP. Today he req'd mod A with both exercises faded to independent. Pt was given rationale for HEP. SLP strongly reiterated pt complete 20-30 reps of each of the exercises/day. He and SLP agreed pt could be seen again in 8 weeks  09/18/23: I experiment with soft things that can go down easy and not choke me. Vienna sausage, oatmeal, soups, ice cream, eggs. Still having 4 tube feedings/day. Sometimes those eggs get a little too hard. I have to cook them softer. Pt coughs eggs up and spits them out if they are too rubbery. SLP suggested scrambled eggs and adding cream or half and half to make them more wet, maybe having gravy with them. SLP also told pt to use gravy and other sauces to make food more slick for easier pharyngeal passage. He ate cereal bar and drank thin liquids today without any oral s/sx difficulty, or overt s/sx pharyngeal deficits. He has not been doing HEP. He  req'd consistent mod A with Mendelsohn and effortful, and was independent at task completion.   08/02/24: SLP  educated pt on swallow exercises today including effortful swallow, and Mendelsohn maneuver. He req'd mod A faded to min A with both exercises. He was told to complete 20 swallows each day for each of these. SLP also provided information about aspiration PNA. See pt instructions.  PATIENT EDUCATION: Education details: see treatment date for today's date Person educated: Patient and sister Education method: Explanation, Demonstration, Verbal cues, and Handouts Education comprehension: verbalized understanding, returned demonstration, verbal cues required, and needs further education   ASSESSMENT:  CLINICAL IMPRESSION: Patient is a 68 y.o. M who was seen today for treatment following RT for esophageal cancer. SLP believes that pt's sx do not require MBS at this time due to pt difficulty is not suspected to be oropharygneal, but may be necessary the future. Pt reported a year ago he did not have any s/sx of coughing with POs. If pt appears WNL with oropharyngeal swallow after next session d/c will be considered with instructions to get MBS if sx oropharyngeal  swallowing safety occur.  OBJECTIVE IMPAIRMENTS: include dysphagia. These impairments are limiting patient from safety when swallowing. Factors affecting potential to achieve goals and functional outcome are co-morbidities, previous level of function, and severity of impairments. Patient will benefit from skilled SLP services to address above impairments and improve overall function.  REHAB POTENTIAL: Good   GOALS: Goals reviewed with patient? No  SHORT TERM GOALS: Target date:  10/16/23 due to visit number  Pt complete HEP with mod I  Baseline: Goal status: not met  2.  Pt demo knowledge of sx of pharyngeal dysphagia Baseline:  Goal status: not met  3.  Pt demo knowledge of overt s/sx aspiration over two  sessions Baseline:  Goal status: partially met  4.  Pt will demo knowledge of how a food journal can assist return to pre-rad tx diet Baseline:  Goal status: not met - pt satisfied with his choices at this time   LONG TERM GOALS: Target date: 02/11/24  Pt will undergo MBS when clinically appropriate Baseline:  Goal status: INITIAL  2.  Pt will complete HEP with mod I over two sessions Baseline:  Goal status: INITIAL  3.  Pt demo knowledge of overt s/sx aspiration PNA over two sessions Baseline:  Goal status: INITIAL  4.  Pt will improve score on PROM Baseline:  Goal status: INITIAL   PLAN:  SLP FREQUENCY: every approx 8 weeks  SLP DURATION: 7 sessions  PLANNED INTERVENTIONS: Aspiration precaution training, Pharyngeal strengthening exercises, Diet toleration management , Environmental controls, Trials of upgraded texture/liquids, Cueing hierachy, Internal/external aids, Oral motor exercises, SLP instruction and feedback, Compensatory strategies, Patient/family education, and [id number] Treatment of swallowing function    Joceline Hinchcliff, CCC-SLP 01/11/2024, 8:09 AM

## 2024-01-12 ENCOUNTER — Inpatient Hospital Stay: Admitting: Dietician

## 2024-01-12 ENCOUNTER — Other Ambulatory Visit: Payer: Self-pay

## 2024-01-12 DIAGNOSIS — C153 Malignant neoplasm of upper third of esophagus: Secondary | ICD-10-CM

## 2024-01-12 NOTE — Progress Notes (Signed)
 Verbal order w/readback for PET Scan skull to thigh for restaging by 01/25/2024.  Order placed and PA sent to [employer] Team.  Will have NM schedule once approved by pt's insurance.

## 2024-01-13 ENCOUNTER — Other Ambulatory Visit: Payer: Self-pay

## 2024-01-13 DIAGNOSIS — C153 Malignant neoplasm of upper third of esophagus: Secondary | ICD-10-CM

## 2024-01-19 ENCOUNTER — Encounter: Payer: Self-pay | Admitting: Hematology

## 2024-01-19 MED ORDER — NUTREN 1.5 EN LIQD: ENTERAL | Status: AC

## 2024-01-19 NOTE — Progress Notes (Signed)
 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 He completed chemotherapy and radiation (08/03/23). Currently receiving immunotherapy with nivolumab . S/p PEG 06/11/23  DME - [employer] 11/14 - esophageal dilation 11/16 - ED eval for atypical chest pain  Met with patient in office. He reports worsening dysphagia in the last 4-6 weeks. Patient does not feel dilation was helpful. He is working with outpatient SLP - last seen yesterday (11/24). Patient previously tolerating soft moist foods (beefaroni, chicken noodle soups, potted meat, eggs) and able to decrease bolus feedings. Patient currently tolerating small sips of V8 splash, Gatorade, beef broth as well small bites of applesauce and thin chips. Patient has increased feedings to 5 cartons Nutren due to poor po toleration and weight loss.    Medications: reviewed   Labs: 11/16 - K 3.3, glucose 141. Albumin 2.8, Corrected Ca 8.6  Anthropometrics: Last wt 244 lb 6.4 oz on 11/17  10/8 - 257 lb 11.2 oz 9/11 - 272 lb 8 oz   Estimated Energy Needs  Kcals: 2400-2700 Protein: 120-135 Fluid: >/= 2.4 L  NUTRITION DIAGNOSIS: Unintended wt loss continues - 10% decrease in last 2 months which is severe for time frame   INTERVENTION:  Continue working with SLP - encourage HEP as prescribed per ST Oral intake of thin liquids as tolerated Resume tube feedings to meet >75% of estimated needs - estimate long term enteral needs Nutren 1.5 - give 2 cartons TID (1500 ml/day). Flush tube with 60 ml water  before and after each bolus + additional 200 ml free water  QID. Provides 2250 kcal, 102 g, 1146 ml free water  (2306 ml total water ). Meets >85% of needs   MONITORING, EVALUATION, GOAL: wt trends, oral intake, TF   NEXT VISIT: Tuesday December 16 in office

## 2024-01-20 ENCOUNTER — Other Ambulatory Visit: Payer: Self-pay | Admitting: Hematology

## 2024-01-21 ENCOUNTER — Encounter: Payer: Self-pay | Admitting: Radiation Oncology

## 2024-01-21 ENCOUNTER — Encounter (HOSPITAL_COMMUNITY): Admission: RE | Admit: 2024-01-21 | Discharge: 2024-01-21 | Attending: Hematology

## 2024-01-21 DIAGNOSIS — C153 Malignant neoplasm of upper third of esophagus: Secondary | ICD-10-CM

## 2024-01-21 LAB — GLUCOSE, CAPILLARY: Glucose-Capillary: 100 mg/dL — ABNORMAL HIGH (ref 70–99)

## 2024-01-21 MED ORDER — FLUDEOXYGLUCOSE F - 18 (FDG) INJECTION
12.2000 | Freq: Once | INTRAVENOUS | Status: AC
  Administered 2024-01-21: 12.17 via INTRAVENOUS

## 2024-01-24 NOTE — Assessment & Plan Note (Addendum)
-  cTxN1M0, invasive squamous cell carcinoma -Present with progressive dysphagia since December 2024. -EGD on 05/27/2023 which showed a mass in the proximal esophagus, 2 to 3 cm below introitus.  Biopsy confirmed invasive squamous cell carcinoma.  -PET scan showed hypermetabolic lesion in the proximal esophagus, with a positive peritracheal lymph node.  No distant metastasis -Status post PEG feeding tube placement  -He started chemoRT with weekly carbo and Taxol  on 06/24/2023 and completed on 08/03/23 -ED visit for chest pain on 06/29/2023 -repeated upper EGD and biopsy was negative for residual tumor on 10/30/2023 - Given the significant residual hypermetabolic activity on the PET scan and his high risk for recurrence, I recommend nivolumab  for 1 year. He started on 11/30/2023 -PET on 01/21/2024 showed disease progression at primary site, no distant mets. Plan to change chemo to FOLFOX and Tislelizumab Bettyjane)

## 2024-01-25 ENCOUNTER — Inpatient Hospital Stay

## 2024-01-25 ENCOUNTER — Other Ambulatory Visit (HOSPITAL_COMMUNITY): Payer: Self-pay

## 2024-01-25 ENCOUNTER — Inpatient Hospital Stay: Attending: Hematology

## 2024-01-25 ENCOUNTER — Inpatient Hospital Stay: Admitting: Licensed Clinical Social Worker

## 2024-01-25 ENCOUNTER — Inpatient Hospital Stay: Admitting: Hematology

## 2024-01-25 VITALS — BP 122/90 | HR 81 | Temp 98.7°F | Resp 16 | Ht 74.0 in | Wt 242.8 lb

## 2024-01-25 VITALS — BP 143/87 | HR 71 | Resp 15

## 2024-01-25 DIAGNOSIS — G893 Neoplasm related pain (acute) (chronic): Secondary | ICD-10-CM | POA: Diagnosis not present

## 2024-01-25 DIAGNOSIS — C153 Malignant neoplasm of upper third of esophagus: Secondary | ICD-10-CM | POA: Diagnosis present

## 2024-01-25 DIAGNOSIS — E1142 Type 2 diabetes mellitus with diabetic polyneuropathy: Secondary | ICD-10-CM | POA: Insufficient documentation

## 2024-01-25 DIAGNOSIS — Z9221 Personal history of antineoplastic chemotherapy: Secondary | ICD-10-CM | POA: Insufficient documentation

## 2024-01-25 DIAGNOSIS — Z5112 Encounter for antineoplastic immunotherapy: Secondary | ICD-10-CM | POA: Insufficient documentation

## 2024-01-25 DIAGNOSIS — Z79899 Other long term (current) drug therapy: Secondary | ICD-10-CM | POA: Insufficient documentation

## 2024-01-25 DIAGNOSIS — Z923 Personal history of irradiation: Secondary | ICD-10-CM | POA: Insufficient documentation

## 2024-01-25 DIAGNOSIS — Z931 Gastrostomy status: Secondary | ICD-10-CM | POA: Insufficient documentation

## 2024-01-25 LAB — CMP (CANCER CENTER ONLY)
ALT: 15 U/L (ref 0–44)
AST: 19 U/L (ref 15–41)
Albumin: 3.8 g/dL (ref 3.5–5.0)
Alkaline Phosphatase: 141 U/L — ABNORMAL HIGH (ref 38–126)
Anion gap: 7 (ref 5–15)
BUN: 12 mg/dL (ref 8–23)
CO2: 30 mmol/L (ref 22–32)
Calcium: 9.1 mg/dL (ref 8.9–10.3)
Chloride: 102 mmol/L (ref 98–111)
Creatinine: 0.69 mg/dL (ref 0.61–1.24)
GFR, Estimated: >60 mL/min (ref >60)
Glucose, Bld: 162 mg/dL — ABNORMAL HIGH (ref 70–99)
Potassium: 4.1 mmol/L (ref 3.5–5.1)
Sodium: 139 mmol/L (ref 135–145)
Total Bilirubin: 0.7 mg/dL (ref 0.0–1.2)
Total Protein: 7.5 g/dL (ref 6.5–8.1)

## 2024-01-25 LAB — CBC WITH DIFFERENTIAL (CANCER CENTER ONLY)
Abs Immature Granulocytes: 0.09 K/uL — ABNORMAL HIGH (ref 0.00–0.07)
Basophils Absolute: 0 K/uL (ref 0.0–0.1)
Basophils Relative: 0 %
Eosinophils Absolute: 0.5 K/uL (ref 0.0–0.5)
Eosinophils Relative: 6 %
HCT: 33.4 % — ABNORMAL LOW (ref 39.0–52.0)
Hemoglobin: 11.4 g/dL — ABNORMAL LOW (ref 13.0–17.0)
Immature Granulocytes: 1 %
Lymphocytes Relative: 8 %
Lymphs Abs: 0.7 K/uL (ref 0.7–4.0)
MCH: 27.1 pg (ref 26.0–34.0)
MCHC: 34.1 g/dL (ref 30.0–36.0)
MCV: 79.3 fL — ABNORMAL LOW (ref 80.0–100.0)
Monocytes Absolute: 0.7 K/uL (ref 0.1–1.0)
Monocytes Relative: 9 %
Neutro Abs: 6 K/uL (ref 1.7–7.7)
Neutrophils Relative %: 76 %
Platelet Count: 307 K/uL (ref 150–400)
RBC: 4.21 MIL/uL — ABNORMAL LOW (ref 4.22–5.81)
RDW: 18.1 % — ABNORMAL HIGH (ref 11.5–15.5)
WBC Count: 8 K/uL (ref 4.0–10.5)
nRBC: 0 % (ref 0.0–0.2)

## 2024-01-25 LAB — TSH: TSH: 1.88 u[IU]/mL (ref 0.350–4.500)

## 2024-01-25 MED ORDER — SODIUM CHLORIDE 0.9 % IV SOLN
480.0000 mg | Freq: Once | INTRAVENOUS | Status: AC
  Administered 2024-01-25: 480 mg via INTRAVENOUS
  Filled 2024-01-25: qty 48

## 2024-01-25 MED ORDER — SODIUM CHLORIDE 0.9 % IV SOLN: INTRAVENOUS | Status: DC

## 2024-01-25 MED ORDER — HYDROCODONE-ACETAMINOPHEN 7.5-325 MG/15ML PO SOLN
10.0000 mL | Freq: Four times a day (QID) | ORAL | 0 refills | Status: DC | PRN
  Filled 2024-01-25: qty 473, 7d supply, fill #0

## 2024-01-25 NOTE — Progress Notes (Signed)
 [facility] CSW Progress Note   Interventions: CSW received a request to leave a carton of osmolite at reception for pt to pick up.  Carton left w/ reception desk.        Follow Up Plan:  Patient will contact CSW with any support or resource needs    Melvin JONELLE Manna, LCSW Clinical Social Worker [facility]

## 2024-01-25 NOTE — Patient Instructions (Signed)
 [facility] - A DEPT OF [facility]   Discharge Instructions: Thank you for choosing [facility] to provide your oncology and hematology care.   If you have a lab appointment with the Cancer Center, please go directly to the Cancer Center and check in at the registration area.   Wear comfortable clothing and clothing appropriate for easy access to any Portacath or PICC line.   We strive to give you quality time with your provider. You may need to reschedule your appointment if you arrive late (15 or more minutes).  Arriving late affects you and other patients whose appointments are after yours.  Also, if you miss three or more appointments without notifying the office, you may be dismissed from the clinic at the provider's discretion.      For prescription refill requests, have your pharmacy contact our office and allow 72 hours for refills to be completed.    Today you received the following chemotherapy and/or immunotherapy agents: Nivolumab (Opdivo)      To help prevent nausea and vomiting after your treatment, we encourage you to take your nausea medication as directed.  BELOW ARE SYMPTOMS THAT SHOULD BE REPORTED IMMEDIATELY: *FEVER GREATER THAN 100.4 F (38 C) OR HIGHER *CHILLS OR SWEATING *NAUSEA AND VOMITING THAT IS NOT CONTROLLED WITH YOUR NAUSEA MEDICATION *UNUSUAL SHORTNESS OF BREATH *UNUSUAL BRUISING OR BLEEDING *URINARY PROBLEMS (pain or burning when urinating, or frequent urination) *BOWEL PROBLEMS (unusual diarrhea, constipation, pain near the anus) TENDERNESS IN MOUTH AND THROAT WITH OR WITHOUT PRESENCE OF ULCERS (sore throat, sores in mouth, or a toothache) UNUSUAL RASH, SWELLING OR PAIN  UNUSUAL VAGINAL DISCHARGE OR ITCHING   Items with * indicate a potential emergency and should be followed up as soon as possible or go to the Emergency Department if any problems should occur.  Please show the CHEMOTHERAPY ALERT CARD or  IMMUNOTHERAPY ALERT CARD at check-in to the Emergency Department and triage nurse.  Should you have questions after your visit or need to cancel or reschedule your appointment, please contact [facility] - A DEPT OF [facility]  Dept: [phone]  and follow the prompts.  Office hours are 8:00 a.m. to 4:30 p.m. Monday - Friday. Please note that voicemails left after 4:00 p.m. may not be returned until the following business day.  We are closed weekends and major holidays. You have access to a nurse at all times for urgent questions. Please call the main number to the clinic Dept: [phone] and follow the prompts.   For any non-urgent questions, you may also contact your provider using MyChart. We now offer e-Visits for anyone 4 and older to request care online for non-urgent symptoms. For details visit [email or web address].   Also download the MyChart app! Go to the app store, search "MyChart", open the app, select [place], and log in with your MyChart username and password.

## 2024-01-25 NOTE — Progress Notes (Signed)
 [facility]   [phone] [phone]   Clinic Follow up Note   Patient Care Team: Haze Kingfisher, MD as PCP - General (Family Medicine) Michele Richardson, DO as PCP - Cardiology (Cardiology) Clance, Francis HERO, MD as Referring Physician (Pulmonary Disease) Lonn Hicks, MD as Consulting Physician (Hematology and Oncology) [name], MD as Attending Physician (Radiation Oncology) [name], MD as Consulting Physician (Hematology and Oncology)  Date of Service:  01/25/2024  CHIEF COMPLAINT: f/u of upper esophageal squamous cell carcinoma  CURRENT THERAPY:  Nivolumab  every 4 weeks  Oncology History   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 -EGD on 05/27/2023 which showed a mass in the proximal esophagus, 2 to 3 cm below introitus.  Biopsy confirmed invasive squamous cell carcinoma.  -PET scan showed hypermetabolic lesion in the proximal esophagus, with a positive peritracheal lymph node.  No distant metastasis -Status post PEG feeding tube placement  -He started chemoRT with weekly carbo and Taxol  on 06/24/2023 and completed on 08/03/23 -ED visit for chest pain on 06/29/2023 -repeated upper EGD and biopsy was negative for residual tumor on 10/30/2023 - Given the significant residual hypermetabolic activity on the PET scan and his high risk for recurrence, I recommend nivolumab  for 1 year. He started on 11/30/2023 -PET on 01/21/2024 showed disease progression at primary site, no distant mets. Plan to change chemo to FOLFOX and Tislelizumab Bettyjane)  Assessment & Plan Malignant neoplasm of upper third of esophagus with progression after chemoradiation Progression of esophageal cancer with increased size and metabolic level on PET scan, indicating worsening condition.  He also developed worsening upper chest pain, which is likely related to his  cancer.  Previous chemoradiation was not sufficient to control the cancer. Immunotherapy alone is insufficient. - Proceeded with immunotherapy nivolumab  today. - Will add oxaliplatin and 5FU chemotherapy regimen later this week or next week, pending insurance approval. -Plan change nivolumab  to Tevimbra in 4 weeks - Educated on cold sensitivity and neuropathy risks associated with chemotherapy. - Instructed on ice pack application for hands and feet to mitigate neuropathy. - Continue tube feeds for nutrition.  Cancer-related back pain Persistent back pain likely related to esophageal cancer progression, with pain radiating from the upper back to the spine. Current pain level is 5-6 out of 10. Tramadol is ineffective. - Prescribed liquid hydrocodone  for pain management. - Instructed to call prescription into [facility].  Peripheral neuropathy Mild peripheral neuropathy with numbness and tingling. Concern for exacerbation with new chemotherapy regimen. - Instructed on ice pack application for hands and feet to mitigate neuropathy.  Dysphagia Likely related to esophageal cancer and previous interventions. - Continue tube feeds for nutrition.  Plan - PET scan reviewed, unfortunately he has disease progression in esophagus - I called in liquid hydrocodone  to [facility] pharmacy for his pain control - Will proceed to nivolumab  today, and change treatment to FOLFOX later this week  SUMMARY OF ONCOLOGIC HISTORY: Oncology History  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HCC)  06/14/2013 Procedure   Biopsy of the tonsil confirmrf squamous cell carcinoma. 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  History of Present Illness Melvin Hudson is a 68 year old male with esophageal cancer who presents with persistent back pain.  He has constant back pain between his shoulder blades rated 5 to 6 out of 10, present since before esophageal dilation and port placement. Tramadol does not help.  He completed chemoradiation for esophageal cancer, with radiation ending in June 2025, and had a PET scan on January 21, 2024 to evaluate disease. He is on immunotherapy and dependent on tube feeds due to inability to eat solid foods.  He has numbness and tingling consistent with neuropathy. Liquid hydrocodone  previously provided effective pain relief.  He lives with his sister, who is his main support. His difficulty eating solid foods has limited social participation in meals.     All other systems were reviewed with the patient and are negative.  MEDICAL HISTORY:  Past Medical History:  Diagnosis Date   Angioedema 09/17/2011   tongue and lips   Arthritis    both of my legs and feet (01/05/2014)   CHF (congestive heart failure) (HCC)    Diabetes mellitus without complication (HCC)    type 2   DVT (deep venous thrombosis) (HCC) 06/16/2023   LLE   Dysphagia    Erectile dysfunction 04/20/2018   Esophageal cancer (HCC)    GI bleed    Heart  murmur 04/20/2018   History of radiation therapy    06/24/2023- 08/03/2023 Dr. 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 ENDOSCOPY;  Service: Endoscopy;  Laterality: Left;   COLONOSCOPY N/A 04/20/2014   Procedure: COLONOSCOPY;  Surgeon: Jerrell KYM Sol, MD;  Location: [facility] ENDOSCOPY;  Service: Endoscopy;  Laterality: N/A;   COLONOSCOPY W/ BIOPSIES AND POLYPECTOMY     benign   DIRECT LARYNGOSCOPY  01/05/2014   DIRECT LARYNGOSCOPY N/A 01/05/2014   Procedure: DIRECT LARYNGOSCOPY AND BIOPSY;  Surgeon: Ida Loader, MD;  Location: [facility] OR;  Service: ENT;  Laterality: N/A;   ESOPHAGEAL DILATION N/A 01/01/2024   Procedure: DILATION, ESOPHAGUS;  Surgeon: Loader Ida, MD;  Location: [facility] OR;  Service: ENT;  Laterality: N/A;   ESOPHAGOSCOPY WITH DILITATION N/A 05/27/2023   Procedure: ESOPHAGOSCOPY;  Surgeon: Loader Ida, MD;  Location: [facility] OR;  Service: ENT;  Laterality: N/A;   ESOPHAGOSCOPY WITH DILITATION N/A 10/30/2023   Procedure: ESOPHAGOSCOPY, WITH BIOPSY;  Surgeon: Loader Ida, MD;  Location: [facility] OR;  Service: ENT;  Laterality: N/A;  With possible biopsy   IR GASTROSTOMY TUBE MOD SED  06/12/2023   IR IMAGING GUIDED PORT INSERTION  11/24/2023   IR RADIOLOGIST EVAL & MGMT  08/10/2023   IR RADIOLOGIST EVAL & MGMT  08/14/2023   KNEE ARTHROSCOPY Right ~ 1994   LEFT AND RIGHT HEART CATHETERIZATION WITH CORONARY/GRAFT ANGIOGRAM N/A 12/31/2010   Procedure: LEFT AND RIGHT HEART CATHETERIZATION WITH EL BILE;  Surgeon: Erick JONELLE Bergamo, MD;  Location: [facility] CATH LAB;  Service: Cardiovascular;  Laterality: N/A;    MULTIPLE EXTRACTIONS WITH ALVEOLOPLASTY N/A 07/08/2013   Procedure: Extraction of tooth #'s 22, 27 with alveoloplasty and bilateal mandibular facial exostoses reductions;  Surgeon: Tanda JULIANNA Fanny, DDS;  Location: [facility] ORS;  Service: Oral Surgery;  Laterality: N/A;   MULTIPLE TOOTH EXTRACTIONS  ~ 2008    I have reviewed the social history and family history with the patient and they are unchanged from previous note.  ALLERGIES:  is allergic to azilsartan and advil [ibuprofen].  MEDICATIONS:  Current Outpatient Medications  Medication Sig Dispense Refill   traMADol (ULTRAM) 50 MG tablet Take 50 mg by mouth 3 (three) times daily as needed.     acetaminophen  (TYLENOL ) 500 MG tablet Take 1,000 mg by mouth 2 (two) times daily.     albuterol  (VENTOLIN  HFA) 108 (90 Base) MCG/ACT inhaler Inhale 2 puffs into the lungs every 4 (four) hours as needed for wheezing (Cough).     allopurinol  (ZYLOPRIM ) 300 MG tablet Place 300 mg into feeding tube daily as needed (Gout).     atorvastatin  (LIPITOR) 40 MG tablet Take 1 tablet (40 mg total) by mouth at bedtime. (Patient taking differently: Place 40 mg into feeding tube at bedtime.) 90 tablet 1   bismuth subsalicylate (PEPTO BISMOL) 262 MG/15ML suspension Place 30 mLs into feeding tube every 6 (six) hours as needed for indigestion or diarrhea or loose stools.     Blood Glucose Monitoring Suppl (ONETOUCH VERIO FLEX SYSTEM) w/Device KIT USE TO CHECK BLOOD SUGAR TWICE DAILY     calcium  carbonate (TUMS - DOSED IN MG ELEMENTAL CALCIUM ) 500 MG chewable tablet Chew 2 tablets (400 mg of elemental calcium  total) by mouth 3 (three) times daily with meals. (Patient taking differently: Place 400 mg of elemental calcium  into feeding tube daily as needed for indigestion or heartburn.) 180 tablet 1   carvedilol  (COREG ) 3.125 MG tablet Place 3.125 mg into feeding tube 2 (two) times daily with a meal.     cetirizine  (ZYRTEC ) 10 MG tablet Place 10 mg into feeding tube daily.      clonazePAM  (KLONOPIN ) 1 MG disintegrating tablet Place 1 mg into feeding tube daily as needed (Anxiety).     dapagliflozin  propanediol (FARXIGA ) 10 MG TABS tablet 10 mg daily. Feeding tube     diphenhydrAMINE  (BENADRYL ) 25 MG tablet Take 1 tablet (25 mg total) by mouth every 6 (six) hours. (Patient taking differently: Take 25 mg by mouth every 6 (six) hours as needed for itching.) 20 tablet 0   ELIQUIS  5 MG TABS tablet TAKE 1 TABLET(5 MG) BY MOUTH TWICE DAILY 60 tablet 1   famotidine  (PEPCID ) 40 MG/5ML suspension PLACE 2.5 MILLILITERS INTO THE FEEDING TUBE DAILY 50 mL 1   gabapentin  (NEURONTIN ) 300 MG capsule 300 mg 3 (three) times daily. Feeding tube  2   HYDROcodone -acetaminophen  (HYCET) 7.5-325 mg/15 ml solution Take 10-15 mLs by mouth every 6 (six) hours as needed for moderate pain (pain score 4-6). 473 mL 0  hydrocortisone  (ANUSOL -HC) 2.5 % rectal cream Place 1 Application rectally daily as needed for hemorrhoids or anal itching.     isosorbide -hydrALAZINE  (BIDIL ) 20-37.5 MG tablet TAKE 1 TABLET BY MOUTH IN THE MORNING AND AT BEDTIME 180 tablet 1   lactose free nutrition (BOOST) LIQD Take 237 mLs by mouth daily. (Patient not taking: Reported on 01/04/2024)     lidocaine  (XYLOCAINE ) 2 % solution MIX 1 PART(5ML) OF LIDOCAINE  AND 1 PART WATER ( ). SWALLOW OF DILUTED MIXTURE 30 MINUTES BEFORE MEALS AND BEDTIME FOR UP TO FOUR TIMES DAILY 200 mL 3   lidocaine -prilocaine  (EMLA ) cream Apply 1 Application topically as needed. (Patient not taking: Reported on 01/04/2024) 30 g 1   Nutritional Supplements (NUTREN 1.5) LIQD Nutren 1.5 - give 2 cartons TID (1500 ml/day). Flush tube with 60 ml water  before and after each bolus + additional 200 ml free water  QID. Provides 2250 kcal, 102 g, 1146 ml free water  (2306 ml total water ). Meets >85% of needs.     nystatin  (MYCOSTATIN ) 100000 UNIT/ML suspension Take 5 mLs (500,000 Units total) by mouth 4 (four) times daily. 473 mL 2   ondansetron  (ZOFRAN ) 8 MG  tablet Take 1 tablet (8 mg total) by mouth every 8 (eight) hours as needed for nausea or vomiting. 20 tablet 2   ONETOUCH VERIO test strip USE TO CHECK BLOOD SUGAR TWICE DAILY     polyethylene glycol (MIRALAX  / GLYCOLAX ) 17 g packet Take 17 g by mouth daily. (Patient taking differently: Place 17 g into feeding tube daily as needed for mild constipation or moderate constipation.) 14 each 0   prochlorperazine  (COMPAZINE ) 10 MG tablet Take 1 tablet (10 mg total) by mouth every 6 (six) hours as needed for nausea or vomiting. 30 tablet 2   Protein (FEEDING SUPPLEMENT, PROSOURCE TF20,) liquid Place 60 mLs into feeding tube 2 (two) times daily.     psyllium (HYDROCIL/METAMUCIL) 95 % PACK Take 1 packet by mouth daily. (Patient taking differently: Take 1 packet by mouth daily as needed for mild constipation or moderate constipation.) 240 each 0   Water  For Irrigation, Sterile (FREE WATER ) SOLN Place 100 mLs into feeding tube every 6 (six) hours.     No current facility-administered medications for this visit.   Facility-Administered Medications Ordered in Other Visits  Medication Dose Route Frequency Provider Last Rate Last Admin   0.9 %  sodium chloride  infusion   Intravenous Continuous [name], MD 10 mL/hr at 01/25/24 0912 New Bag at 01/25/24 0912   nivolumab  (OPDIVO ) 480 mg in sodium chloride  0.9 % 100 mL chemo infusion  480 mg Intravenous Once [name], MD 296 mL/hr at 01/25/24 0943 480 mg at 01/25/24 0943    PHYSICAL EXAMINATION: ECOG PERFORMANCE STATUS: 2 - Symptomatic, <50% confined to bed  Vitals:   01/25/24 0800 01/25/24 0834  BP: (!) 144/88 (!) 122/90  Pulse: 80 81  Resp: 16   Temp: 98.7 F (37.1 C)   SpO2: 97% 99%   Wt Readings from Last 3 Encounters:  01/25/24 242 lb 12.8 oz (110.1 kg)  01/04/24 244 lb 6.4 oz (110.9 kg)  01/03/24 248 lb (112.5 kg)     GENERAL:alert, no distress and comfortable SKIN: skin color, texture, turgor are normal, no rashes or significant  lesions EYES: normal, Conjunctiva are pink and non-injected, sclera clear NECK: supple, thyroid  normal size, non-tender, without nodularity LYMPH:  no palpable lymphadenopathy in the cervical, axillary  LUNGS: clear to auscultation and percussion with normal breathing effort HEART: regular  rate & rhythm and no murmurs and no lower extremity edema ABDOMEN:abdomen soft, non-tender and normal bowel sounds Musculoskeletal:no cyanosis of digits and no clubbing  NEURO: alert & oriented x 3 with fluent speech, no focal motor/sensory deficits  Physical Exam    LABORATORY DATA:  I have reviewed the data as listed    Latest Ref Rng & Units 01/25/2024    8:14 AM 01/04/2024   11:19 AM 01/03/2024    7:19 AM  CBC  WBC 4.0 - 10.5 K/uL 8.0   10.4   Hemoglobin 13.0 - 17.0 g/dL 88.5  87.8  89.1   Hematocrit 39.0 - 52.0 % 33.4  35.7  32.5   Platelets 150 - 400 K/uL 307   260         Latest Ref Rng & Units 01/25/2024    8:14 AM 01/04/2024   11:19 AM 01/03/2024    7:19 AM  CMP  Glucose 70 - 99 mg/dL 837  90  858   BUN 8 - 23 mg/dL 12  13  12    Creatinine 0.61 - 1.24 mg/dL 9.30  9.29  9.25   Sodium 135 - 145 mmol/L 139  144  140   Potassium 3.5 - 5.1 mmol/L 4.1  4.2  3.3   Chloride 98 - 111 mmol/L 102  99  106   CO2 22 - 32 mmol/L 30  28  22    Calcium  8.9 - 10.3 mg/dL 9.1  9.6  8.0   Total Protein 6.5 - 8.1 g/dL 7.5   6.1   Total Bilirubin 0.0 - 1.2 mg/dL 0.7   1.1   Alkaline Phos 38 - 126 U/L 141   93   AST 15 - 41 U/L 19   16   ALT 0 - 44 U/L 15   13       RADIOGRAPHIC STUDIES: I have personally reviewed the radiological images as listed and agreed with the findings in the report. No results found.    No orders of the defined types were placed in this encounter.  All questions were answered. The patient knows to call the clinic with any problems, questions or concerns. No barriers to learning was detected. The total time spent in the appointment was 40 minutes, including review  of chart and various tests results, discussions about plan of care and coordination of care plan     Onita Mattock, MD 01/25/2024

## 2024-01-26 ENCOUNTER — Ambulatory Visit: Admitting: Physician Assistant

## 2024-01-26 LAB — T4: T4, Total: 7 ug/dL (ref 4.5–12.0)

## 2024-01-29 ENCOUNTER — Other Ambulatory Visit: Payer: Self-pay

## 2024-02-01 NOTE — Progress Notes (Signed)
°  Mr. Melvin Hudson presents to the clinic today to see Leeroy Due PA-C for a routine follow -up. He completed radiation treatment for: Malignant neoplasm of upper third of esophagus on 08-03-2023.   Pain issues, if any: Denies Using a feeding tube?: able to decrease bolus feedings.  Patient has increased feedings to 5 cartons Nutren due to poor po toleration and weight loss.    Weight changes, if any:  02/02/24 - 239.0 lb 01/25/24 - 242 lb 12.8 oz 01/04/24 - 244 lb 11/25/23 - 257 lb 11.2 oz 10/29/23 - 272 lb 8 oz  Swallowing issues, if any: Yes Smoking or chewing tobacco? Denies Using fluoride toothpaste daily? Dentures Last ENT visit was on: Follow-up here 3 months with Dr. Jesus, MD Other notable issues, if any: None at this time.   BP 132/88 (BP Location: Right Arm, Patient Position: Sitting, Cuff Size: Large)   Pulse 79   Temp 97.9 F (36.6 C)   Resp 20   Ht 6' 2 (1.88 m)   Wt 239 lb (108.4 kg)   SpO2 100%   BMI 30.69 kg/m

## 2024-02-01 NOTE — Progress Notes (Incomplete)
 Radiation Oncology         [phone]) [phone] ________________________________  Name: Melvin Hudson MRN: [id number]  Date: 02/02/2024  DOB: August 10, 1955  Follow-Up Visit Note  CC: Haze Kingfisher, MD  Haze Kingfisher, MD  Diagnosis and Prior Radiotherapy:     No diagnosis found. ***     Cancer Staging  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HCC) Staging form: Pharynx - Oropharynx, AJCC 7th Edition - Clinical: Stage IVA (T3, N2b, M0) - Unsigned  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) Staging form: Esophagus - Squamous Cell Carcinoma, AJCC 8th Edition - Clinical stage from 05/27/2023: Stage Unknown (cTX, cN1, cM0, GX) - Signed by [name], MD on 06/23/2023 Stage prefix: Initial diagnosis Histologic grading system: 3 grade system  ==========DELIVERED PLANS==========  First Treatment Date: 2023-06-24 Last Treatment Date: 2023-08-03   Plan Name: Esoph_Up_Init Site: Esophagus Technique: IMRT Mode: Photon Dose Per Fraction: 1.8 Gy Prescribed Dose (Delivered / Prescribed): 45 Gy / 45 Gy Prescribed Fxs (Delivered / Prescribed): 25 / 25   Plan Name: Esoph_Bst_Up Site: Esophagus Technique: IMRT Mode: Photon Dose Per Fraction: 1.8 Gy Prescribed Dose (Delivered / Prescribed): 5.4 Gy / 5.4 Gy Prescribed Fxs (Delivered / Prescribed): 3 / 3  cTx, N1, 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 He completed his treatment on 6 months ago  Since he was last seen by us , patient underwent PET on 01/21/2024. Imaging at that time demonstrated progressive circumferential wall thickening of the esophagus with associated diffuse hypermetabolic activity, concerning for progressive esophageal cancer.   He met with Dr. Lanny on 01/25/2024. Per her recommendations, patient proceeded  with immunotherapy with the addition of oxaliplatin and 5FU chemotherapy. He was given liquid hydrocodone  for pain control.   ***           ALLERGIES:  is allergic to azilsartan and advil [ibuprofen].  Meds: Current Outpatient Medications  Medication Sig Dispense Refill   acetaminophen  (TYLENOL ) 500 MG tablet Take 1,000 mg by mouth 2 (two) times daily.     albuterol  (VENTOLIN  HFA) 108 (90 Base) MCG/ACT inhaler Inhale 2 puffs into the lungs every 4 (four) hours as needed for wheezing (Cough).     allopurinol  (ZYLOPRIM ) 300 MG tablet Place 300 mg into feeding tube daily as needed (Gout).     atorvastatin  (LIPITOR) 40 MG tablet Take 1 tablet (40 mg total) by mouth at bedtime. (Patient taking differently: Place 40 mg into feeding tube at bedtime.) 90 tablet 1   bismuth subsalicylate (PEPTO BISMOL) 262 MG/15ML suspension Place 30 mLs into feeding tube every 6 (six) hours as needed for indigestion or diarrhea or loose stools.     Blood Glucose Monitoring Suppl (ONETOUCH VERIO FLEX SYSTEM) w/Device KIT USE TO CHECK BLOOD SUGAR TWICE DAILY     calcium  carbonate (TUMS - DOSED IN MG ELEMENTAL CALCIUM ) 500 MG chewable tablet Chew 2 tablets (400 mg of elemental calcium  total) by mouth 3 (three) times daily with meals. (Patient taking differently: Place 400 mg of elemental calcium  into feeding tube daily as needed for indigestion or heartburn.) 180 tablet 1   carvedilol  (COREG ) 3.125 MG tablet Place 3.125 mg into feeding tube 2 (two) times daily with a meal.     cetirizine (ZYRTEC) 10 MG tablet Place 10 mg into feeding tube daily.     clonazePAM  (KLONOPIN ) 1 MG  disintegrating tablet Place 1 mg into feeding tube daily as needed (Anxiety).     dapagliflozin  propanediol (FARXIGA ) 10 MG TABS tablet 10 mg daily. Feeding tube     diphenhydrAMINE  (BENADRYL ) 25 MG tablet Take 1 tablet (25 mg total) by mouth every 6 (six) hours. (Patient taking differently: Take 25 mg by mouth every 6 (six) hours as needed for  itching.) 20 tablet 0   ELIQUIS  5 MG TABS tablet TAKE 1 TABLET(5 MG) BY MOUTH TWICE DAILY 60 tablet 1   famotidine  (PEPCID ) 40 MG/5ML suspension PLACE 2.5 MILLILITERS INTO THE FEEDING TUBE DAILY 50 mL 1   gabapentin  (NEURONTIN ) 300 MG capsule 300 mg 3 (three) times daily. Feeding tube  2   HYDROcodone -acetaminophen  (HYCET) 7.5-325 mg/15 ml solution Take 10-15 mLs by mouth every 6 (six) hours as needed for moderate pain (pain score 4-6). 473 mL 0   hydrocortisone  (ANUSOL -HC) 2.5 % rectal cream Place 1 Application rectally daily as needed for hemorrhoids or anal itching.     isosorbide -hydrALAZINE  (BIDIL ) 20-37.5 MG tablet TAKE 1 TABLET BY MOUTH IN THE MORNING AND AT BEDTIME 180 tablet 1   lactose free nutrition (BOOST) LIQD Take 237 mLs by mouth daily. (Patient not taking: Reported on 01/04/2024)     lidocaine  (XYLOCAINE ) 2 % solution MIX 1 PART(5ML) OF LIDOCAINE  AND 1 PART WATER ( ). SWALLOW OF DILUTED MIXTURE 30 MINUTES BEFORE MEALS AND BEDTIME FOR UP TO FOUR TIMES DAILY 200 mL 3   lidocaine -prilocaine  (EMLA ) cream Apply 1 Application topically as needed. (Patient not taking: Reported on 01/04/2024) 30 g 1   Nutritional Supplements (NUTREN 1.5) LIQD Nutren 1.5 - give 2 cartons TID (1500 ml/day). Flush tube with 60 ml water  before and after each bolus + additional 200 ml free water  QID. Provides 2250 kcal, 102 g, 1146 ml free water  (2306 ml total water ). Meets >85% of needs.     nystatin  (MYCOSTATIN ) 100000 UNIT/ML suspension Take 5 mLs (500,000 Units total) by mouth 4 (four) times daily. 473 mL 2   ondansetron  (ZOFRAN ) 8 MG tablet Take 1 tablet (8 mg total) by mouth every 8 (eight) hours as needed for nausea or vomiting. 20 tablet 2   ONETOUCH VERIO test strip USE TO CHECK BLOOD SUGAR TWICE DAILY     polyethylene glycol (MIRALAX  / GLYCOLAX ) 17 g packet Take 17 g by mouth daily. (Patient taking differently: Place 17 g into feeding tube daily as needed for mild constipation or moderate  constipation.) 14 each 0   prochlorperazine  (COMPAZINE ) 10 MG tablet Take 1 tablet (10 mg total) by mouth every 6 (six) hours as needed for nausea or vomiting. 30 tablet 2   Protein (FEEDING SUPPLEMENT, PROSOURCE TF20,) liquid Place 60 mLs into feeding tube 2 (two) times daily.     psyllium (HYDROCIL/METAMUCIL) 95 % PACK Take 1 packet by mouth daily. (Patient taking differently: Take 1 packet by mouth daily as needed for mild constipation or moderate constipation.) 240 each 0   traMADol (ULTRAM) 50 MG tablet Take 50 mg by mouth 3 (three) times daily as needed.     Water  For Irrigation, Sterile (FREE WATER ) SOLN Place 100 mLs into feeding tube every 6 (six) hours.     No current facility-administered medications for this visit.    Physical Findings: *** The patient is in no acute distress. Patient is alert and oriented. Wt Readings from Last 3 Encounters:  01/25/24 242 lb 12.8 oz (110.1 kg)  01/04/24 244 lb 6.4 oz (110.9 kg)  01/03/24 248  lb (112.5 kg)    vitals were not taken for this visit. .  General: Alert and oriented, in no acute distress HEENT: Head is normocephalic. Extraocular movements are intact. Oropharynx is notable for mucositis changes on the tongue. No concerning lesions within the oropharynx.  Neck: Neck is notable for no concerning cervical, submental, or supraclavicular adenopathy.   Skin: Skin in treatment fields shows satisfactory healing Heart: Regular in rate and rhythm with no murmurs, rubs, or gallops. Chest: Clear to auscultation bilaterally, with no rhonchi, wheezes, or rales. Abdomen: Soft, nontender, nondistended, with no rigidity or guarding. PEG tube in-tact with no signs of infection Extremities: No cyanosis or edema. Lymphatics: see Neck Exam Psychiatric: Judgment and insight are intact. Affect is appropriate.   Lab Findings: Lab Results  Component Value Date   WBC 8.0 01/25/2024   HGB 11.4 (L) 01/25/2024   HCT 33.4 (L) 01/25/2024   MCV 79.3 (L)  01/25/2024   PLT 307 01/25/2024    Lab Results  Component Value Date   TSH 1.880 01/25/2024    Radiographic Findings: NM PET Image Restag (PS) Skull Base To Thigh Result Date: 01/21/2024 CLINICAL DATA:  Subsequent treatment strategy for esophageal cancer. Progressive thoracic adenopathy on CT. EXAM: NUCLEAR MEDICINE PET SKULL BASE TO THIGH TECHNIQUE: 12.17 mCi F-18 FDG was injected intravenously. Full-ring PET imaging was performed from the skull base to thigh after the radiotracer. CT data was obtained and used for attenuation correction and anatomic localization. Fasting blood glucose: 100 mg/dl COMPARISON:  Chest CTA 01/03/2024, PET-CT 10/23/2023 and 06/11/2023, and chest CT 04/28/2023. FINDINGS: Mediastinal blood pool activity: SUV max 2.9 NECK: No hypermetabolic cervical lymph nodes are identified. No suspicious activity identified within the pharyngeal mucosal space. Incidental CT findings: none CHEST: As seen on recent chest CT, there is progressive circumferential wall thickening of the esophagus extending from the level of the thyroid  cartilage to the subcarinal region. There is associated diffuse hypermetabolic activity in these areas, extending approximately 13 cm in length. Proximally, this demonstrates an SUV max of 19.8 (previously 8.8). Distally, there is an SUV max of 26.4 at the level of the carina (previously not hypermetabolic). No discretely enlarged or hypermetabolic mediastinal, hilar or axillary lymph nodes. No hypermetabolic pulmonary activity or suspicious nodularity. Incidental CT findings: Right IJ Port-A-Cath extends into the upper right atrium. Atherosclerosis of the aorta, great vessels and coronary arteries. ABDOMEN/PELVIS: There is no hypermetabolic activity within the liver, adrenal glands, spleen or pancreas. There is no hypermetabolic nodal activity in the abdomen or pelvis. Mildly prominent activity near the hepatic flexure of the colon (SUV max 5.8, previously 3.5). No  obvious corresponding abnormality on the CT images. There is physiologic activity associated with the percutaneous gastrostomy tube, similar to previous study. Incidental CT findings: Aortoiliac atherosclerosis. Distal colonic diverticulosis. SKELETON: There is no hypermetabolic activity to suggest osseous metastatic disease. Incidental CT findings: Heterogeneous sclerosis and trabecular thickening in the pelvis again noted, most consistent with Paget's disease. Mild spondylosis. IMPRESSION: 1. Progressive circumferential wall thickening of the esophagus with associated diffuse hypermetabolic activity concerning for progressive esophageal cancer. 2. No evidence of adjacent hypermetabolic thoracic adenopathy or distant metastatic disease. 3. Mildly prominent activity near the hepatic flexure of the colon without obvious corresponding abnormality on the CT images. This is nonspecific, and could be physiologic. Correlate with colon cancer screening history. 4.  Aortic Atherosclerosis (ICD10-I70.0). Electronically Signed   By: Elsie Perone M.D.   On: 01/21/2024 15:16   CT Angio Chest PE W  and/or Wo Contrast Result Date: 01/03/2024 EXAM: CTA CHEST 01/03/2024 11:44:04 AM TECHNIQUE: CTA of the chest was performed without and with the administration of 75 mL of iohexol  (OMNIPAQUE ) 350 MG/ML intravenous contrast. Multiplanar reformatted images are provided for review. MIP images are provided for review. Automated exposure control, iterative reconstruction, and/or weight based adjustment of the mA/kV was utilized to reduce the radiation dose to as low as reasonably achievable. COMPARISON: 04/28/2023 CLINICAL HISTORY: Pulmonary embolism (PE) suspected, high prob. FINDINGS: PULMONARY ARTERIES: Pulmonary arteries are adequately opacified for evaluation. No acute pulmonary embolus. Main pulmonary artery is normal in caliber. MEDIASTINUM: The heart and pericardium demonstrate no acute abnormality. Atheromatous  calcifications of the aorta and coronary arteries. Proximal thoracic esophageal wall thickening consistent with history of esophageal cancer. LYMPH NODES: Confluent mediastinal adenopathy extending from the AP window to the lower paratracheal region consistent with progression of adenopathy this can be correlated with repeat PET scan. LUNGS AND PLEURA: The lungs are without acute process. No focal consolidation or pulmonary edema. No evidence of pleural effusion or pneumothorax. UPPER ABDOMEN: Limited images of the upper abdomen are unremarkable. SOFT TISSUES AND BONES: No acute bone or soft tissue abnormality. IMPRESSION: 1. No pulmonary embolism 2. Proximal thoracic esophageal wall thickening consistent with history of esophageal cancer 3. Confluent mediastinal adenopathy from the AP window to the lower paratracheal region, consistent with progressive nodal disease; consider repeat PET/CT for further assessment Electronically signed by: Fonda Field MD 01/03/2024 11:52 AM EST RP Workstation: [id number]   DG Chest Port 1 View Result Date: 01/03/2024 CLINICAL DATA:  Chest pain EXAM: PORTABLE CHEST 1 VIEW COMPARISON:  08/14/2023 FINDINGS: The cardiopericardial silhouette is within normal limits for size. Interstitial markings are diffusely coarsened with chronic features. The lungs are clear without focal pneumonia, edema, pneumothorax or pleural effusion. Right Port-A-Cath remains in place. Telemetry leads overlie the chest. IMPRESSION: Chronic interstitial coarsening without acute cardiopulmonary findings. Electronically Signed   By: Camellia Candle M.D.   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 Patient has decided to proceed with chemoimmunotherapy under Dr. Demetra care.   ***   2)  Nutritional Status: Patient using approximately 5 cartons/day. He is meeting with nutrition later today. *** Wt Readings from Last 3 Encounters:  09/01/23 253 lb 12.8 oz (115.1 kg)  08/20/23 242 lb 12.8 oz (110.1 kg)  08/07/23 239 lb 4.8 oz (108.5 kg)  PEG tube: Intact ***  3) Risk Factors: The patient has been educated about risk factors including alcohol  and tobacco abuse; they understand that avoidance of alcohol  and tobacco is important to prevent recurrences as well as other cancers ***  4) Swallowing: Continues to experience food getting caught in his throat. We reviewed typical effects from radiation. If he continues to experience this ~16-months out from treatment we could consider a swallowing study. Encouraged soft solid foods and recommended OTC Maaylox or Gaviscone or 1 Tbsp of cooking oil. He is scheduled to see Lupita on 09/04/2023.  ***  5) Dental: Encouraged to continue regular followup with dentistry, and dental hygiene including fluoride rinses. Patient is edentulous.  ***  6) Thyroid  function: Checking annually. Will get TSH at next follow-up in November. *** Lab Results  Component Value Date   TSH 1.880 01/25/2024    7) He is scheduled for follow-up with medical oncology on 02/23/2023. ***  On date of service, in total, I spent 25 minutes on this encounter. Patient was seen in person. _____________________________________    Leeroy Due, PA-C

## 2024-02-02 ENCOUNTER — Other Ambulatory Visit (HOSPITAL_COMMUNITY): Payer: Self-pay

## 2024-02-02 ENCOUNTER — Other Ambulatory Visit: Payer: Self-pay

## 2024-02-02 ENCOUNTER — Encounter: Payer: Self-pay | Admitting: Licensed Clinical Social Worker

## 2024-02-02 ENCOUNTER — Ambulatory Visit
Admission: RE | Admit: 2024-02-02 | Discharge: 2024-02-02 | Disposition: A | Source: Ambulatory Visit | Attending: Radiation Oncology | Admitting: Radiation Oncology

## 2024-02-02 ENCOUNTER — Inpatient Hospital Stay: Admitting: Dietician

## 2024-02-02 ENCOUNTER — Encounter: Payer: Self-pay | Admitting: Radiology

## 2024-02-02 ENCOUNTER — Ambulatory Visit
Admission: RE | Admit: 2024-02-02 | Discharge: 2024-02-02 | Disposition: A | Discharge status: Home / Self Care | Source: Ambulatory Visit | Attending: Hematology | Admitting: Hematology

## 2024-02-02 ENCOUNTER — Ambulatory Visit
Admission: RE | Admit: 2024-02-02 | Discharge: 2024-02-02 | Payer: Self-pay | Attending: Radiology | Admitting: Radiation Oncology

## 2024-02-02 VITALS — BP 132/88 | HR 79 | Temp 97.9°F | Resp 20 | Ht 74.0 in | Wt 239.0 lb

## 2024-02-02 DIAGNOSIS — C153 Malignant neoplasm of upper third of esophagus: Secondary | ICD-10-CM

## 2024-02-02 DIAGNOSIS — Z8501 Personal history of malignant neoplasm of esophagus: Secondary | ICD-10-CM | POA: Insufficient documentation

## 2024-02-02 DIAGNOSIS — C090 Malignant neoplasm of tonsillar fossa: Secondary | ICD-10-CM

## 2024-02-02 DIAGNOSIS — Z08 Encounter for follow-up examination after completed treatment for malignant neoplasm: Secondary | ICD-10-CM | POA: Insufficient documentation

## 2024-02-02 LAB — TSH: TSH: 2.27 u[IU]/mL (ref 0.350–4.500)

## 2024-02-02 NOTE — Progress Notes (Signed)
 Radiation Oncology         [phone] ________________________________  Name: Melvin Hudson MRN: [id number]  Date: 02/02/2024  DOB: [birth year]  Follow-Up Visit Note  CC: Haze Kingfisher, MD  Paliwal, Himanshu, MD  Diagnosis and Prior Radiotherapy:       ICD-10-CM   1. 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) Staging form: Esophagus - Squamous Cell Carcinoma, AJCC 8th Edition - Clinical stage from 05/27/2023: Stage Unknown (cTX, cN1, cM0, GX) - Signed by [name], MD on 06/23/2023 Stage prefix: Initial diagnosis Histologic grading system: 3 grade system  ==========DELIVERED PLANS==========  First Treatment Date: 2023-06-24 Last Treatment Date: 2023-08-03   Plan Name: Esoph_Up_Init Site: Esophagus Technique: IMRT Mode: Photon Dose Per Fraction: 1.8 Gy Prescribed Dose (Delivered / Prescribed): 45 Gy / 45 Gy Prescribed Fxs (Delivered / Prescribed): 25 / 25   Plan Name: Esoph_Bst_Up Site: Esophagus Technique: IMRT Mode: Photon Dose Per Fraction: 1.8 Gy Prescribed Dose (Delivered / Prescribed): 5.4 Gy / 5.4 Gy Prescribed Fxs (Delivered / Prescribed): 3 / 3  cTx, N1, 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 He completed his treatment on 6 months ago  Since he was last seen by us , patient underwent PET on 01/21/2024. Imaging at that time demonstrated progressive circumferential wall thickening of the esophagus with associated diffuse hypermetabolic activity,  concerning for progressive esophageal cancer.   He met with Dr. Lanny on 01/25/2024. Per her recommendations, patient proceeded with immunotherapy with the addition of oxaliplatin and 5FU chemotherapy. He was given liquid hydrocodone  for pain control.   He has lost weight and is still using his feeding tube.  He has a swallowing study in a couple days.  He meets with our nutritionist later today.             ALLERGIES:  is allergic to azilsartan and advil [ibuprofen].  Meds: Current Outpatient Medications  Medication Sig Dispense Refill   acetaminophen  (TYLENOL ) 500 MG tablet Take 1,000 mg by mouth 2 (two) times daily.     albuterol  (VENTOLIN  HFA) 108 (90 Base) MCG/ACT inhaler Inhale 2 puffs into the lungs every 4 (four) hours as needed for wheezing (Cough).     allopurinol  (ZYLOPRIM ) 300 MG tablet Place 300 mg into feeding tube daily as needed (Gout).     atorvastatin  (LIPITOR) 40 MG tablet Take 1 tablet (40 mg total) by mouth at bedtime. (Patient taking differently: Place 40 mg into feeding tube at bedtime.) 90 tablet 1   bismuth subsalicylate (PEPTO BISMOL) 262 MG/15ML suspension Place 30 mLs into feeding tube every 6 (six) hours as needed for indigestion or diarrhea or loose stools.     Blood Glucose Monitoring Suppl (ONETOUCH VERIO FLEX SYSTEM) w/Device KIT USE TO CHECK BLOOD SUGAR TWICE DAILY     calcium  carbonate (TUMS - DOSED IN MG ELEMENTAL CALCIUM ) 500 MG chewable tablet Chew 2 tablets (400 mg of elemental calcium  total) by mouth 3 (three) times daily with meals. (Patient taking differently: Place 400 mg  of elemental calcium  into feeding tube daily as needed for indigestion or heartburn.) 180 tablet 1   carvedilol  (COREG ) 3.125 MG tablet Place 3.125 mg into feeding tube 2 (two) times daily with a meal.     cetirizine (ZYRTEC) 10 MG tablet Place 10 mg into feeding tube daily.     clonazePAM  (KLONOPIN ) 1 MG disintegrating tablet Place 1 mg into feeding tube daily as needed (Anxiety).      dapagliflozin  propanediol (FARXIGA ) 10 MG TABS tablet 10 mg daily. Feeding tube     diphenhydrAMINE  (BENADRYL ) 25 MG tablet Take 1 tablet (25 mg total) by mouth every 6 (six) hours. (Patient taking differently: Take 25 mg by mouth every 6 (six) hours as needed for itching.) 20 tablet 0   ELIQUIS  5 MG TABS tablet TAKE 1 TABLET(5 MG) BY MOUTH TWICE DAILY 60 tablet 1   famotidine  (PEPCID ) 40 MG/5ML suspension PLACE 2.5 MILLILITERS INTO THE FEEDING TUBE DAILY 50 mL 1   gabapentin  (NEURONTIN ) 300 MG capsule 300 mg 3 (three) times daily. Feeding tube  2   HYDROcodone -acetaminophen  (HYCET) 7.5-325 mg/15 ml solution Take 10-15 mLs by mouth every 6 (six) hours as needed for moderate pain (pain score 4-6). 473 mL 0   hydrocortisone  (ANUSOL -HC) 2.5 % rectal cream Place 1 Application rectally daily as needed for hemorrhoids or anal itching.     isosorbide -hydrALAZINE  (BIDIL ) 20-37.5 MG tablet TAKE 1 TABLET BY MOUTH IN THE MORNING AND AT BEDTIME 180 tablet 1   lactose free nutrition (BOOST) LIQD Take 237 mLs by mouth daily. (Patient not taking: Reported on 01/04/2024)     lidocaine  (XYLOCAINE ) 2 % solution MIX 1 PART(5ML) OF LIDOCAINE  AND 1 PART WATER ( ). SWALLOW OF DILUTED MIXTURE 30 MINUTES BEFORE MEALS AND BEDTIME FOR UP TO FOUR TIMES DAILY 200 mL 3   lidocaine -prilocaine  (EMLA ) cream Apply 1 Application topically as needed. (Patient not taking: Reported on 01/04/2024) 30 g 1   Nutritional Supplements (NUTREN 1.5) LIQD Nutren 1.5 - give 2 cartons TID (1500 ml/day). Flush tube with 60 ml water  before and after each bolus + additional 200 ml free water  QID. Provides 2250 kcal, 102 g, 1146 ml free water  (2306 ml total water ). Meets >85% of needs.     nystatin  (MYCOSTATIN ) 100000 UNIT/ML suspension Take 5 mLs (500,000 Units total) by mouth 4 (four) times daily. 473 mL 2   ondansetron  (ZOFRAN ) 8 MG tablet Take 1 tablet (8 mg total) by mouth every 8 (eight) hours as needed for nausea or vomiting. 20 tablet 2    ONETOUCH VERIO test strip USE TO CHECK BLOOD SUGAR TWICE DAILY     polyethylene glycol (MIRALAX  / GLYCOLAX ) 17 g packet Take 17 g by mouth daily. (Patient taking differently: Place 17 g into feeding tube daily as needed for mild constipation or moderate constipation.) 14 each 0   prochlorperazine  (COMPAZINE ) 10 MG tablet Take 1 tablet (10 mg total) by mouth every 6 (six) hours as needed for nausea or vomiting. 30 tablet 2   Protein (FEEDING SUPPLEMENT, PROSOURCE TF20,) liquid Place 60 mLs into feeding tube 2 (two) times daily.     psyllium (HYDROCIL/METAMUCIL) 95 % PACK Take 1 packet by mouth daily. (Patient taking differently: Take 1 packet by mouth daily as needed for mild constipation or moderate constipation.) 240 each 0   traMADol (ULTRAM) 50 MG tablet Take 50 mg by mouth 3 (three) times daily as needed.     Water  For Irrigation, Sterile (FREE WATER ) SOLN Place 100 mLs into  feeding tube every 6 (six) hours.     No current facility-administered medications for this encounter.    Physical Findings:   The patient is in no acute distress. Patient is alert and oriented. Wt Readings from Last 3 Encounters:  02/02/24 239 lb (108.4 kg)  01/25/24 242 lb 12.8 oz (110.1 kg)  01/04/24 244 lb 6.4 oz (110.9 kg)    vitals were not taken for this visit. .  General: Alert and oriented, in no acute distress HEENT: Head is normocephalic. Extraocular movements are intact. Oropharynx is clear  Neck: Neck is notable for no concerning cervical, submental, or supraclavicular adenopathy.   Skin: Skin in treatment fields shows satisfactory healing over neck  Abdomen: Soft, nontender, nondistended, with no rigidity or guarding. PEG tube in-tact with no signs of infection Lymphatics: see Neck Exam MSK: Ambulates with a cane Psychiatric: Judgment and insight are intact. Affect is appropriate.   Lab Findings: Lab Results  Component Value Date   WBC 8.0 01/25/2024   HGB 11.4 (L) 01/25/2024   HCT 33.4 (L)  01/25/2024   MCV 79.3 (L) 01/25/2024   PLT 307 01/25/2024    Lab Results  Component Value Date   TSH 2.270 02/02/2024    Radiographic Findings: NM PET Image Restag (PS) Skull Base To Thigh Result Date: 01/21/2024 CLINICAL DATA:  Subsequent treatment strategy for esophageal cancer. Progressive thoracic adenopathy on CT. EXAM: NUCLEAR MEDICINE PET SKULL BASE TO THIGH TECHNIQUE: 12.17 mCi F-18 FDG was injected intravenously. Full-ring PET imaging was performed from the skull base to thigh after the radiotracer. CT data was obtained and used for attenuation correction and anatomic localization. Fasting blood glucose: 100 mg/dl COMPARISON:  Chest CTA 01/03/2024, PET-CT 10/23/2023 and 06/11/2023, and chest CT 04/28/2023. FINDINGS: Mediastinal blood pool activity: SUV max 2.9 NECK: No hypermetabolic cervical lymph nodes are identified. No suspicious activity identified within the pharyngeal mucosal space. Incidental CT findings: none CHEST: As seen on recent chest CT, there is progressive circumferential wall thickening of the esophagus extending from the level of the thyroid  cartilage to the subcarinal region. There is associated diffuse hypermetabolic activity in these areas, extending approximately 13 cm in length. Proximally, this demonstrates an SUV max of 19.8 (previously 8.8). Distally, there is an SUV max of 26.4 at the level of the carina (previously not hypermetabolic). No discretely enlarged or hypermetabolic mediastinal, hilar or axillary lymph nodes. No hypermetabolic pulmonary activity or suspicious nodularity. Incidental CT findings: Right IJ Port-A-Cath extends into the upper right atrium. Atherosclerosis of the aorta, great vessels and coronary arteries. ABDOMEN/PELVIS: There is no hypermetabolic activity within the liver, adrenal glands, spleen or pancreas. There is no hypermetabolic nodal activity in the abdomen or pelvis. Mildly prominent activity near the hepatic flexure of the colon (SUV  max 5.8, previously 3.5). No obvious corresponding abnormality on the CT images. There is physiologic activity associated with the percutaneous gastrostomy tube, similar to previous study. Incidental CT findings: Aortoiliac atherosclerosis. Distal colonic diverticulosis. SKELETON: There is no hypermetabolic activity to suggest osseous metastatic disease. Incidental CT findings: Heterogeneous sclerosis and trabecular thickening in the pelvis again noted, most consistent with Paget's disease. Mild spondylosis. IMPRESSION: 1. Progressive circumferential wall thickening of the esophagus with associated diffuse hypermetabolic activity concerning for progressive esophageal cancer. 2. No evidence of adjacent hypermetabolic thoracic adenopathy or distant metastatic disease. 3. Mildly prominent activity near the hepatic flexure of the colon without obvious corresponding abnormality on the CT images. This is nonspecific, and could be physiologic. Correlate with colon  cancer screening history. 4.  Aortic Atherosclerosis (ICD10-I70.0). Electronically Signed   By: Elsie Perone M.D.   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 Patient has decided to proceed with chemoimmunotherapy under Dr. Demetra care.     2) Nutritional Status: Patient using approximately 5 cartons/day. He is meeting with nutrition later today.   Wt Readings from Last 3 Encounters:  02/02/24 239 lb (108.4 kg)  01/25/24 242 lb 12.8 oz (110.1 kg)  01/04/24 244 lb 6.4 oz (110.9 kg)   PEG - using  3) Risk Factors: The patient has been educated about risk factors including alcohol  and tobacco abuse; they understand that avoidance of alcohol  and tobacco is important to prevent recurrences as well as other cancers    4) Swallowing: Continues to experience  food getting caught in his throat. Will have swallowing study in 2 days, follow with SLP and ENT.    5)  Thyroid  function: Checking with Dr Lanny Lab Results  Component Value Date   TSH 2.270 02/02/2024   6) He is scheduled for follow-up with medical oncology on 02/23/2023. He will see rad onc prn moving forward. I wished him the very best.  On date of service, in total, I spent 30 minutes on this encounter. Patient was seen in person. -----------------------------------  Lauraine Golden, MD

## 2024-02-02 NOTE — Progress Notes (Signed)
 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 He completed chemotherapy and radiation (08/03/23). Currently receiving immunotherapy with nivolumab . Recent imaging revealed progression. Planning to start Folfox + Tevimbra. S/p PEG 06/11/23   DME: [facility] 11/14 - esophageal dilation 11/16 - ED eval for atypical chest pain  Met with patient after clinic follow-up. Patient reports minimal po intake secondary to dysphagia. Some days he can tolerate sips or small bites of smooth foods (water , soda, juice, ice cream). Most of the time it bubbles back up. He received shipment of formula and supplies. Currently giving one carton Nutren 1.5 - x5/day. He does 6 cartons if he gets up early enough.    Medications: reviewed   Labs: no new labs   Anthropometrics: Wt  239 lb today decreased 7% in 10 weeks - severe   12/8 - 242 lb 12.8 oz 11/17 - 244 lb 6.4 oz 10/8 - 257 lb 11.2 oz    Estimated Energy Needs  Kcals: 2400-2700 Protein: 120-135 Fluid: >/= 2.4 L  NUTRITION DIAGNOSIS: Unintended wt loss - ongoing    INTERVENTION:  Continue working to increase tube feedings to goal  Nutren 1.5 - give 2 cartons TID (1500 ml/day). Flush tube with 60 ml water  before and after each bolus + additional 200 ml free water  QID. Provides 2250 kcal, 102 g, 1146 ml free water  (2306 ml total water ) Oral intake as tolerated   MONITORING, EVALUATION, GOAL: wt trends, FT, intake   NEXT VISIT: Tuesday January 6 during infusion

## 2024-02-03 ENCOUNTER — Other Ambulatory Visit: Payer: Self-pay | Admitting: Hematology

## 2024-02-04 ENCOUNTER — Other Ambulatory Visit: Payer: Self-pay | Admitting: Hematology

## 2024-02-04 ENCOUNTER — Other Ambulatory Visit: Payer: Self-pay

## 2024-02-04 ENCOUNTER — Other Ambulatory Visit (HOSPITAL_COMMUNITY): Payer: Self-pay

## 2024-02-04 ENCOUNTER — Inpatient Hospital Stay (HOSPITAL_COMMUNITY)
Admission: RE | Admit: 2024-02-04 | Discharge: 2024-02-04 | Attending: Radiation Oncology | Admitting: Radiation Oncology

## 2024-02-04 ENCOUNTER — Encounter: Payer: Self-pay | Admitting: Hematology

## 2024-02-04 DIAGNOSIS — C153 Malignant neoplasm of upper third of esophagus: Secondary | ICD-10-CM | POA: Diagnosis present

## 2024-02-04 MED ORDER — HYDROCODONE-ACETAMINOPHEN 7.5-325 MG/15ML PO SOLN
10.0000 mL | Freq: Four times a day (QID) | ORAL | 0 refills | Status: DC | PRN
  Filled 2024-02-04: qty 473, 8d supply, fill #0

## 2024-02-05 ENCOUNTER — Other Ambulatory Visit (HOSPITAL_COMMUNITY): Payer: Self-pay | Admitting: *Deleted

## 2024-02-05 ENCOUNTER — Emergency Department (HOSPITAL_COMMUNITY)
Admission: EM | Admit: 2024-02-05 | Discharge: 2024-02-05 | Disposition: A | Discharge status: Home / Self Care | Source: Home / Self Care | Attending: Emergency Medicine | Admitting: Emergency Medicine

## 2024-02-05 ENCOUNTER — Other Ambulatory Visit: Payer: Self-pay

## 2024-02-05 ENCOUNTER — Telehealth: Payer: Self-pay | Admitting: Dietician

## 2024-02-05 ENCOUNTER — Other Ambulatory Visit: Payer: Self-pay | Admitting: Hematology

## 2024-02-05 ENCOUNTER — Emergency Department (HOSPITAL_COMMUNITY)

## 2024-02-05 ENCOUNTER — Encounter (HOSPITAL_COMMUNITY): Payer: Self-pay

## 2024-02-05 DIAGNOSIS — C153 Malignant neoplasm of upper third of esophagus: Secondary | ICD-10-CM | POA: Insufficient documentation

## 2024-02-05 DIAGNOSIS — K1379 Other lesions of oral mucosa: Secondary | ICD-10-CM | POA: Insufficient documentation

## 2024-02-05 DIAGNOSIS — R1310 Dysphagia, unspecified: Secondary | ICD-10-CM

## 2024-02-05 DIAGNOSIS — Z7901 Long term (current) use of anticoagulants: Secondary | ICD-10-CM | POA: Diagnosis not present

## 2024-02-05 LAB — CBC WITH DIFFERENTIAL/PLATELET
Abs Immature Granulocytes: 0.07 K/uL (ref 0.00–0.07)
Basophils Absolute: 0 K/uL (ref 0.0–0.1)
Basophils Relative: 0 %
Eosinophils Absolute: 0.4 K/uL (ref 0.0–0.5)
Eosinophils Relative: 6 %
HCT: 34.8 % — ABNORMAL LOW (ref 39.0–52.0)
Hemoglobin: 11.4 g/dL — ABNORMAL LOW (ref 13.0–17.0)
Immature Granulocytes: 1 %
Lymphocytes Relative: 10 %
Lymphs Abs: 0.8 K/uL (ref 0.7–4.0)
MCH: 26.8 pg (ref 26.0–34.0)
MCHC: 32.8 g/dL (ref 30.0–36.0)
MCV: 81.7 fL (ref 80.0–100.0)
Monocytes Absolute: 0.8 K/uL (ref 0.1–1.0)
Monocytes Relative: 11 %
Neutro Abs: 5.3 K/uL (ref 1.7–7.7)
Neutrophils Relative %: 72 %
Platelets: 285 K/uL (ref 150–400)
RBC: 4.26 MIL/uL (ref 4.22–5.81)
RDW: 19.3 % — ABNORMAL HIGH (ref 11.5–15.5)
WBC: 7.3 K/uL (ref 4.0–10.5)
nRBC: 0 % (ref 0.0–0.2)

## 2024-02-05 LAB — PROTIME-INR
INR: 1.1 (ref 0.8–1.2)
Prothrombin Time: 14.9 s (ref 11.4–15.2)

## 2024-02-05 LAB — BASIC METABOLIC PANEL WITH GFR
Anion gap: 8 (ref 5–15)
BUN: 16 mg/dL (ref 8–23)
CO2: 29 mmol/L (ref 22–32)
Calcium: 9.2 mg/dL (ref 8.9–10.3)
Chloride: 103 mmol/L (ref 98–111)
Creatinine, Ser: 0.59 mg/dL — ABNORMAL LOW (ref 0.61–1.24)
GFR, Estimated: >60 mL/min
Glucose, Bld: 126 mg/dL — ABNORMAL HIGH (ref 70–99)
Potassium: 4.2 mmol/L (ref 3.5–5.1)
Sodium: 140 mmol/L (ref 135–145)

## 2024-02-05 LAB — TYPE AND SCREEN
ABO/RH(D): O POS
Antibody Screen: NEGATIVE

## 2024-02-05 MED ORDER — TRANEXAMIC ACID-NACL 1000-0.7 MG/100ML-% IV SOLN
1000.0000 mg | Freq: Once | INTRAVENOUS | Status: AC
  Administered 2024-02-05: 1000 mg via INTRAVENOUS
  Filled 2024-02-05: qty 100

## 2024-02-05 NOTE — Telephone Encounter (Signed)
 Received call from patient this morning. Patient reports having swallow study yesterday. States everything is going down my wind pipe. Study abandoned after evaluation of hypopharynx demonstrating aspiration with recommendation for evaluation by SLP.   Patient advised NPO until evaluation. Okay to swish with water  and then spit out for mouth dryness. Patient is understanding.   He is meeting nutrition goals via PEG. Tolerating 1 1/2 cartons Nutren 1.5 QID. Patient gives half bottle of water  each bolus + additional water  with medications.   Continue 6 cartons Nutren 1.5 via PEG. Reviewed hydration. Recommend 60 ml water  before/after bolus + additional free water  with meds (1160 ml or 2.5 bottles of water /day). Provides 2250 kcal, 102 g, 1146 ml free water  (2306 ml total water ).  Will follow-up with patient as scheduled, Tuesday 1/6. Patient has contact information.

## 2024-02-05 NOTE — ED Provider Notes (Signed)
 "  EMERGENCY DEPARTMENT AT [facility] Provider Note   CSN: [id number] Arrival date & time: 02/05/24  1601     Patient presents with: Mouth is Bleeding   Melvin Hudson is a 68 y.o. male.  He has a history of proximal esophageal cancer and is getting treatment with Dr. Ileana and Dr. Izell.  He said today he began experiencing bleeding from his mouth.  Shows me a bottle of about 200 cc of frank blood.  He feels it slowed considerably.  No trauma.  This is a new problem.  He is on Eliquis .  He said he had a swallowing study yesterday.  They told him it was abnormal and he would need further studies.  No chest pain or shortness of breath no fevers or chills.  {Add pertinent medical, surgical, social history, OB history to [id number]} The history is provided by the patient.       Prior to Admission medications  Medication Sig Start Date End Date Taking? Authorizing Provider  acetaminophen  (TYLENOL ) 500 MG tablet Take 1,000 mg by mouth 2 (two) times daily.    [provider]  albuterol  (VENTOLIN  HFA) 108 (90 Base) MCG/ACT inhaler Inhale 2 puffs into the lungs every 4 (four) hours as needed for wheezing (Cough). 04/02/23   [provider]  allopurinol  (ZYLOPRIM ) 300 MG tablet Place 300 mg into feeding tube daily as needed (Gout).    [provider]  atorvastatin  (LIPITOR) 40 MG tablet Take 1 tablet (40 mg total) by mouth at bedtime. Patient taking differently: Place 40 mg into feeding tube at bedtime. 10/07/19 01/04/24  [name], [name], DO  bismuth subsalicylate (PEPTO BISMOL) 262 MG/15ML suspension Place 30 mLs into feeding tube every 6 (six) hours as needed for indigestion or diarrhea or loose stools.    [provider]  Blood Glucose Monitoring Suppl (ONETOUCH VERIO FLEX SYSTEM) w/Device KIT USE TO CHECK BLOOD SUGAR TWICE DAILY 04/21/19   [provider]  calcium  carbonate (TUMS - DOSED IN MG ELEMENTAL CALCIUM ) 500 MG chewable tablet  Chew 2 tablets (400 mg of elemental calcium  total) by mouth 3 (three) times daily with meals. Patient taking differently: Place 400 mg of elemental calcium  into feeding tube daily as needed for indigestion or heartburn. 04/18/22   [name], DO  carvedilol  (COREG ) 3.125 MG tablet Place 3.125 mg into feeding tube 2 (two) times daily with a meal.    [provider]  cetirizine (ZYRTEC) 10 MG tablet Place 10 mg into feeding tube daily.    [provider]  clonazePAM  (KLONOPIN ) 1 MG disintegrating tablet Place 1 mg into feeding tube daily as needed (Anxiety). 03/10/22   [provider]  dapagliflozin  propanediol (FARXIGA ) 10 MG TABS tablet 10 mg daily. Feeding tube    [provider]  diphenhydrAMINE  (BENADRYL ) 25 MG tablet Take 1 tablet (25 mg total) by mouth every 6 (six) hours. Patient taking differently: Take 25 mg by mouth every 6 (six) hours as needed for itching. 10/29/13   [name], MD  ELIQUIS  5 MG TABS tablet TAKE 1 TABLET(5 MG) BY MOUTH TWICE DAILY 01/21/24   [name], MD  famotidine  (PEPCID ) 40 MG/5ML suspension PLACE 2.5 MILLILITERS INTO THE FEEDING TUBE DAILY 02/05/24   [name], MD  gabapentin  (NEURONTIN ) 300 MG capsule 300 mg 3 (three) times daily. Feeding tube 01/19/18   [provider]  HYDROcodone -acetaminophen  (HYCET) 7.5-325 mg/15 ml solution Take 10-15 mLs by mouth every 6 (six) hours as needed for  moderate pain (pain score 4-6). 02/04/24   [name], MD  hydrocortisone  (ANUSOL -HC) 2.5 % rectal cream Place 1 Application rectally daily as needed for hemorrhoids or anal itching. 04/29/23   [provider]  isosorbide -hydrALAZINE  (BIDIL ) 20-37.5 MG tablet TAKE 1 TABLET BY MOUTH IN THE MORNING AND AT BEDTIME 01/04/24   [name], [name], DO  lactose free nutrition (BOOST) LIQD Take 237 mLs by mouth daily. Patient not taking: Reported on 01/04/2024    [provider]  lidocaine  (XYLOCAINE ) 2 % solution MIX 1 PART(5ML) OF  LIDOCAINE  AND 1 PART WATER ( ). SWALLOW OF DILUTED MIXTURE 30 MINUTES BEFORE MEALS AND BEDTIME FOR UP TO FOUR TIMES DAILY 10/20/23   [name], MD  lidocaine -prilocaine  (EMLA ) cream Apply 1 Application topically as needed. Patient not taking: Reported on 01/04/2024 11/30/23   [name], MD  Nutritional Supplements (NUTREN 1.5) LIQD Nutren 1.5 - give 2 cartons TID (1500 ml/day). Flush tube with 60 ml water  before and after each bolus + additional 200 ml free water  QID. Provides 2250 kcal, 102 g, 1146 ml free water  (2306 ml total water ). Meets >85% of needs. 01/19/24   [name], MD  nystatin  (MYCOSTATIN ) 100000 UNIT/ML suspension Take 5 mLs (500,000 Units total) by mouth 4 (four) times daily. 11/25/23   [name], MD  ondansetron  (ZOFRAN ) 8 MG tablet Take 1 tablet (8 mg total) by mouth every 8 (eight) hours as needed for nausea or vomiting. 11/30/23   [name], MD  [facility] VERIO test strip USE TO CHECK BLOOD SUGAR TWICE DAILY 07/20/19   [provider]  polyethylene glycol (MIRALAX  / GLYCOLAX ) 17 g packet Take 17 g by mouth daily. Patient taking differently: Place 17 g into feeding tube daily as needed for mild constipation or moderate constipation. 04/19/22   [name], DO  prochlorperazine  (COMPAZINE ) 10 MG tablet Take 1 tablet (10 mg total) by mouth every 6 (six) hours as needed for nausea or vomiting. 11/30/23   [name], MD  Protein (FEEDING SUPPLEMENT, PROSOURCE TF20,) liquid Place 60 mLs into feeding tube 2 (two) times daily. 06/17/23   Dahal, Chapman, MD  psyllium (HYDROCIL/METAMUCIL) 95 % PACK Take 1 packet by mouth daily. Patient taking differently: Take 1 packet by mouth daily as needed for mild constipation or moderate constipation. 04/15/22   [name], DO  traMADol (ULTRAM) 50 MG tablet Take 50 mg by mouth 3 (three) times daily as needed. 01/08/24   [provider]  Water  For Irrigation, Sterile (FREE WATER ) SOLN Place 100 mLs into feeding tube every 6  (six) hours. 06/17/23   Arlice Chapman, MD    Allergies: Azilsartan and Advil [ibuprofen]    Review of Systems  Constitutional:  Negative for fever.  HENT:  Positive for trouble swallowing.   Respiratory:  Negative for shortness of breath.   Cardiovascular:  Negative for chest pain.  Gastrointestinal:  Negative for abdominal pain and vomiting.    Updated Vital Signs BP (!) 164/104   Pulse 88   Temp 98.9 F (37.2 C) (Oral)   Resp 18   Ht 6' 2 (1.88 m)   Wt 111.1 kg   SpO2 100%   BMI 31.46 kg/m   Physical Exam Vitals and nursing note reviewed.  Constitutional:      General: He is not in acute distress.    Appearance: Normal appearance. He is well-developed.  HENT:     Head: Normocephalic and atraumatic.     Mouth/Throat:     Comments: Patient is  adentulous.  There are some blood in the hypopharynx but I do not see any active bleeding. Eyes:     Conjunctiva/sclera: Conjunctivae normal.  Cardiovascular:     Rate and Rhythm: Normal rate and regular rhythm.     Heart sounds: No murmur heard. Pulmonary:     Effort: Pulmonary effort is normal. No respiratory distress.     Breath sounds: Normal breath sounds.  Abdominal:     Palpations: Abdomen is soft.     Tenderness: There is no abdominal tenderness. There is no guarding or rebound.  Musculoskeletal:        General: No deformity.     Cervical back: Neck supple.  Skin:    General: Skin is warm and dry.     Capillary Refill: Capillary refill takes less than 2 seconds.  Neurological:     General: No focal deficit present.     Mental Status: He is alert.     (all labs ordered are listed, but only abnormal results are displayed) Labs Reviewed  BASIC METABOLIC PANEL WITH GFR  CBC WITH DIFFERENTIAL/PLATELET  PROTIME-INR  TYPE AND SCREEN    EKG: None  Radiology: DG ESOPHAGUS W SINGLE CM (SOL OR THIN BA) Result Date: 02/04/2024 CLINICAL DATA:  Upper esophageal dysphagia. History of esophageal cancer with prior  radiation therapy. EXAM: ESOPHOGRAM/BARIUM SWALLOW TECHNIQUE: Single contrast examination was performed using  thin barium. FLUOROSCOPY: Radiation Exposure Index (as provided by the fluoroscopic device): 2.5 mGy Kerma COMPARISON:  PET of 01/21/2024. FINDINGS: Initial evaluation of the hypopharynx demonstrates a delayed swallow trigger. Prominent cricopharyngeus. With swallows, there is small volume aspiration including on image 189 of series 1. The patient had retained contrast within the oropharynx and was unable to complete the bolus. At this point, decision was made to forego the remainder of the exam until speech pathology evaluation had been performed. IMPRESSION: Initial evaluation of the hypopharynx demonstrating aspiration. Therefore, exam was halted. Recommend further evaluation with speech pathology. These results will be called to the ordering clinician or representative by the Radiologist Assistant, and communication documented in the PACS or [employer]. Electronically Signed   By: Rockey Kilts M.D.   On: 02/04/2024 12:32    {Document cardiac monitor, telemetry assessment procedure when appropriate:32947} Procedures   Medications Ordered in the ED - No data to display    {Click here for ABCD2, HEART and other calculators REFRESH Note before signing:1}                              Medical Decision Making Amount and/or Complexity of Data Reviewed Labs: ordered. Radiology: ordered.   This patient complains of ***; this involves an extensive number of treatment Options and is a complaint that carries with it a high risk of complications and morbidity. The differential includes ***  I ordered, reviewed and interpreted labs, which included *** I ordered medication *** and reviewed PMP when indicated. I ordered imaging studies which included *** and I independently    visualized and interpreted imaging which showed *** Additional history obtained from *** Previous records  obtained and reviewed *** I consulted *** and discussed lab and imaging findings and discussed disposition.  Cardiac monitoring reviewed, *** Social determinants considered, *** Critical Interventions: ***  After the interventions stated above, I reevaluated the patient and found *** Admission and further testing considered, ***   {Document critical care time when appropriate  Document review of labs and clinical  decision tools ie [id number], etc  Document your independent review of radiology images and any outside records  Document your discussion with family members, caretakers and with consultants  Document social determinants of health affecting pt's care  Document your decision making why or why not admission, treatments were needed:32947:::1}   Final diagnoses:  None    ED Discharge Orders     None        "

## 2024-02-05 NOTE — Progress Notes (Signed)
 Oncology Nurse Navigator Documentation   I received a message from Dr. Charisse nurse and Lupita Connor SLP after his recent esophagram which recommended NPO status and that he needed to see SLP for evaluation. Lupita has been following him and recommended MBS to evaluate if there were any textures that he could swallow safely if Melvin Hudson was interested. I called and spoke to him and he would like to proceed with MBS. The order has been placed and will hopefully be scheduled before Mr. Hunley' appointment with Lupita on 03/07/24. I will follow.  Delon Jefferson RN, BSN, OCN Head & Neck Oncology Nurse Navigator [facility] at [facility] Phone # [phone]  Fax # [phone]

## 2024-02-05 NOTE — ED Triage Notes (Signed)
 Patient said his throat is bleeding. Has throat cancer and finished chemo and radiation. Noticed that his mouth keeps filling with bright red blood. Takes a blood thinner.

## 2024-02-05 NOTE — Discharge Instructions (Signed)
 You were seen in the emergency department for bleeding in your mouth, most likely from your esophageal cancer.  Your bleeding had mostly stopped and your blood counts and vital signs were stable.  Please stop your Eliquis  and do not restart until you talk to your oncology team Monday.  Return if increased bleeding or other concerns.

## 2024-02-05 NOTE — Telephone Encounter (Signed)
 Received phone call from patient. States he has bad news to share. Reports he is on the way to ED for evaluation of large volume hematemesis. Request RD to let his doctors know. Secure chat to team. Support and encouragement offered.

## 2024-02-06 ENCOUNTER — Encounter (HOSPITAL_COMMUNITY): Payer: Self-pay | Admitting: Emergency Medicine

## 2024-02-06 ENCOUNTER — Observation Stay (HOSPITAL_COMMUNITY)
Admission: EM | Admit: 2024-02-06 | Discharge: 2024-02-08 | Disposition: A | Attending: Emergency Medicine | Admitting: Emergency Medicine

## 2024-02-06 DIAGNOSIS — Z8501 Personal history of malignant neoplasm of esophagus: Secondary | ICD-10-CM | POA: Insufficient documentation

## 2024-02-06 DIAGNOSIS — F411 Generalized anxiety disorder: Secondary | ICD-10-CM | POA: Diagnosis not present

## 2024-02-06 DIAGNOSIS — Z87891 Personal history of nicotine dependence: Secondary | ICD-10-CM | POA: Insufficient documentation

## 2024-02-06 DIAGNOSIS — K1379 Other lesions of oral mucosa: Secondary | ICD-10-CM | POA: Diagnosis present

## 2024-02-06 DIAGNOSIS — I10 Essential (primary) hypertension: Secondary | ICD-10-CM | POA: Diagnosis present

## 2024-02-06 DIAGNOSIS — R041 Hemorrhage from throat: Secondary | ICD-10-CM | POA: Diagnosis present

## 2024-02-06 DIAGNOSIS — Z86718 Personal history of other venous thrombosis and embolism: Secondary | ICD-10-CM | POA: Diagnosis not present

## 2024-02-06 DIAGNOSIS — I509 Heart failure, unspecified: Secondary | ICD-10-CM | POA: Diagnosis not present

## 2024-02-06 DIAGNOSIS — I110 Hypertensive heart disease with heart failure: Secondary | ICD-10-CM | POA: Diagnosis not present

## 2024-02-06 DIAGNOSIS — M545 Low back pain, unspecified: Secondary | ICD-10-CM | POA: Diagnosis not present

## 2024-02-06 DIAGNOSIS — R739 Hyperglycemia, unspecified: Secondary | ICD-10-CM

## 2024-02-06 DIAGNOSIS — E66811 Obesity, class 1: Secondary | ICD-10-CM | POA: Insufficient documentation

## 2024-02-06 DIAGNOSIS — E785 Hyperlipidemia, unspecified: Secondary | ICD-10-CM | POA: Diagnosis present

## 2024-02-06 DIAGNOSIS — Z79899 Other long term (current) drug therapy: Secondary | ICD-10-CM | POA: Diagnosis not present

## 2024-02-06 DIAGNOSIS — Z6831 Body mass index (BMI) 31.0-31.9, adult: Secondary | ICD-10-CM | POA: Diagnosis not present

## 2024-02-06 DIAGNOSIS — M549 Dorsalgia, unspecified: Secondary | ICD-10-CM | POA: Insufficient documentation

## 2024-02-06 DIAGNOSIS — E119 Type 2 diabetes mellitus without complications: Secondary | ICD-10-CM | POA: Diagnosis not present

## 2024-02-06 DIAGNOSIS — E6609 Other obesity due to excess calories: Secondary | ICD-10-CM

## 2024-02-06 DIAGNOSIS — K2289 Other specified disease of esophagus: Principal | ICD-10-CM

## 2024-02-06 DIAGNOSIS — Z85818 Personal history of malignant neoplasm of other sites of lip, oral cavity, and pharynx: Secondary | ICD-10-CM | POA: Insufficient documentation

## 2024-02-06 DIAGNOSIS — Z7901 Long term (current) use of anticoagulants: Secondary | ICD-10-CM | POA: Diagnosis not present

## 2024-02-06 DIAGNOSIS — C153 Malignant neoplasm of upper third of esophagus: Secondary | ICD-10-CM | POA: Diagnosis present

## 2024-02-06 DIAGNOSIS — D649 Anemia, unspecified: Secondary | ICD-10-CM | POA: Insufficient documentation

## 2024-02-06 DIAGNOSIS — I82409 Acute embolism and thrombosis of unspecified deep veins of unspecified lower extremity: Secondary | ICD-10-CM | POA: Diagnosis present

## 2024-02-06 LAB — BASIC METABOLIC PANEL WITH GFR
Anion gap: 11 (ref 5–15)
BUN: 13 mg/dL (ref 8–23)
CO2: 27 mmol/L (ref 22–32)
Calcium: 9.5 mg/dL (ref 8.9–10.3)
Chloride: 105 mmol/L (ref 98–111)
Creatinine, Ser: 0.5 mg/dL — ABNORMAL LOW (ref 0.61–1.24)
GFR, Estimated: >60 mL/min
Glucose, Bld: 126 mg/dL — ABNORMAL HIGH (ref 70–99)
Potassium: 4.1 mmol/L (ref 3.5–5.1)
Sodium: 142 mmol/L (ref 135–145)

## 2024-02-06 LAB — CBC WITH DIFFERENTIAL/PLATELET
Abs Immature Granulocytes: 0.06 K/uL (ref 0.00–0.07)
Basophils Absolute: 0 K/uL (ref 0.0–0.1)
Basophils Relative: 0 %
Eosinophils Absolute: 0.2 K/uL (ref 0.0–0.5)
Eosinophils Relative: 2 %
HCT: 33.9 % — ABNORMAL LOW (ref 39.0–52.0)
Hemoglobin: 11.3 g/dL — ABNORMAL LOW (ref 13.0–17.0)
Immature Granulocytes: 1 %
Lymphocytes Relative: 8 %
Lymphs Abs: 0.8 K/uL (ref 0.7–4.0)
MCH: 27.2 pg (ref 26.0–34.0)
MCHC: 33.3 g/dL (ref 30.0–36.0)
MCV: 81.7 fL (ref 80.0–100.0)
Monocytes Absolute: 0.8 K/uL (ref 0.1–1.0)
Monocytes Relative: 9 %
Neutro Abs: 7.6 K/uL (ref 1.7–7.7)
Neutrophils Relative %: 80 %
Platelets: 306 K/uL (ref 150–400)
RBC: 4.15 MIL/uL — ABNORMAL LOW (ref 4.22–5.81)
RDW: 19.6 % — ABNORMAL HIGH (ref 11.5–15.5)
WBC: 9.5 K/uL (ref 4.0–10.5)
nRBC: 0 % (ref 0.0–0.2)

## 2024-02-06 LAB — HEMOGLOBIN AND HEMATOCRIT, BLOOD
HCT: 33.1 % — ABNORMAL LOW (ref 39.0–52.0)
HCT: 33 % — ABNORMAL LOW (ref 39.0–52.0)
Hemoglobin: 10.8 g/dL — ABNORMAL LOW (ref 13.0–17.0)
Hemoglobin: 10.9 g/dL — ABNORMAL LOW (ref 13.0–17.0)

## 2024-02-06 LAB — TYPE AND SCREEN
ABO/RH(D): O POS
Antibody Screen: NEGATIVE

## 2024-02-06 MED ORDER — ACETAMINOPHEN 650 MG RE SUPP: 650.0000 mg | Freq: Four times a day (QID) | RECTAL | Status: DC | PRN

## 2024-02-06 MED ORDER — CHLORHEXIDINE GLUCONATE CLOTH 2 % EX PADS
6.0000 | MEDICATED_PAD | Freq: Every day | CUTANEOUS | Status: DC
  Administered 2024-02-06 – 2024-02-07 (×2): 6 via TOPICAL

## 2024-02-06 MED ORDER — ONDANSETRON HCL 4 MG PO TABS: 4.0000 mg | ORAL_TABLET | Freq: Four times a day (QID) | ORAL | Status: DC | PRN

## 2024-02-06 MED ORDER — ATORVASTATIN CALCIUM 40 MG PO TABS
40.0000 mg | ORAL_TABLET | Freq: Every day | ORAL | Status: DC
  Administered 2024-02-06 – 2024-02-07 (×2): 40 mg
  Filled 2024-02-06 (×2): qty 1

## 2024-02-06 MED ORDER — TRAZODONE HCL 50 MG PO TABS: 25.0000 mg | ORAL_TABLET | Freq: Every evening | ORAL | Status: DC | PRN

## 2024-02-06 MED ORDER — FREE WATER
100.0000 mL | Freq: Four times a day (QID) | Status: DC
  Administered 2024-02-06 – 2024-02-07 (×5): 100 mL

## 2024-02-06 MED ORDER — CARMEX CLASSIC LIP BALM EX OINT
TOPICAL_OINTMENT | CUTANEOUS | Status: DC | PRN
  Administered 2024-02-06: 1 via TOPICAL
  Filled 2024-02-06: qty 10

## 2024-02-06 MED ORDER — SODIUM CHLORIDE 0.9 % IV BOLUS
500.0000 mL | Freq: Once | INTRAVENOUS | Status: AC
  Administered 2024-02-06: 500 mL via INTRAVENOUS

## 2024-02-06 MED ORDER — ACETAMINOPHEN 325 MG PO TABS: 650.0000 mg | ORAL_TABLET | Freq: Four times a day (QID) | ORAL | Status: DC | PRN

## 2024-02-06 MED ORDER — SODIUM CHLORIDE 0.9% FLUSH
10.0000 mL | Freq: Two times a day (BID) | INTRAVENOUS | Status: DC
  Administered 2024-02-06: 10 mL
  Administered 2024-02-06: 20 mL
  Administered 2024-02-07 – 2024-02-08 (×3): 10 mL

## 2024-02-06 MED ORDER — HYDROCODONE-ACETAMINOPHEN 5-325 MG PO TABS: 1.0000 | ORAL_TABLET | ORAL | Status: DC | PRN

## 2024-02-06 MED ORDER — CARVEDILOL 3.125 MG PO TABS
3.1250 mg | ORAL_TABLET | Freq: Two times a day (BID) | ORAL | Status: DC
  Administered 2024-02-06 – 2024-02-08 (×4): 3.125 mg
  Filled 2024-02-06 (×4): qty 1

## 2024-02-06 MED ORDER — ONDANSETRON HCL 4 MG/2ML IJ SOLN: 4.0000 mg | Freq: Four times a day (QID) | INTRAMUSCULAR | Status: DC | PRN

## 2024-02-06 MED ORDER — PROSOURCE TF20 ENFIT COMPATIBL EN LIQD
60.0000 mL | Freq: Two times a day (BID) | ENTERAL | Status: DC
  Administered 2024-02-06 – 2024-02-08 (×4): 60 mL
  Filled 2024-02-06 (×4): qty 60

## 2024-02-06 MED ORDER — SODIUM CHLORIDE 0.9% FLUSH: 10.0000 mL | INTRAVENOUS | Status: DC | PRN

## 2024-02-06 MED ORDER — HYDROCODONE-ACETAMINOPHEN 5-325 MG PO TABS
1.0000 | ORAL_TABLET | ORAL | Status: DC | PRN
  Administered 2024-02-07 (×2): 2
  Filled 2024-02-06 (×2): qty 2

## 2024-02-06 MED ORDER — ALBUTEROL SULFATE (2.5 MG/3ML) 0.083% IN NEBU: 2.5000 mg | INHALATION_SOLUTION | RESPIRATORY_TRACT | Status: DC | PRN

## 2024-02-06 NOTE — ED Provider Notes (Signed)
 " [place] EMERGENCY DEPARTMENT AT [facility] Provider Note   CSN: [id number] Arrival date & time: 02/06/24  9641     Patient presents with: Hematemesis   Melvin Hudson is a 68 y.o. male.   The history is provided by the patient.   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  He was seen in the emergency department because he was spitting up blood, but was sent home and the bleeding seemed to subside.  At home, he resumed spitting up blood and blood and a bottle with blood which appears to contain approximately 300 mL of blood in it.  He is not actively spitting up blood at this time.  He took his last dose of apixaban  yesterday morning.  He denies any pain.  He has been unable to swallow for several months because of his cancer.    Prior to Admission medications  Medication Sig Start Date End Date Taking? Authorizing Provider  acetaminophen  (TYLENOL ) 500 MG tablet Take 1,000 mg by mouth 2 (two) times daily.    [provider]  albuterol  (VENTOLIN  HFA) 108 (90 Base) MCG/ACT inhaler Inhale 2 puffs into the lungs every 4 (four) hours as needed for wheezing (Cough). 04/02/23   [provider]  allopurinol  (ZYLOPRIM ) 300 MG tablet Place 300 mg into feeding tube daily as needed (Gout).    [provider]  atorvastatin  (LIPITOR) 40 MG tablet Take 1 tablet (40 mg total) by mouth at bedtime. Patient taking differently: Place 40 mg into feeding tube at bedtime. 10/07/19 01/04/24  [name], [name], DO  bismuth subsalicylate (PEPTO BISMOL) 262 MG/15ML suspension Place 30 mLs into feeding tube every 6 (six) hours as needed for indigestion or diarrhea or loose stools.    [provider]  Blood Glucose Monitoring Suppl (ONETOUCH VERIO FLEX SYSTEM) w/Device KIT USE TO CHECK BLOOD SUGAR TWICE DAILY 04/21/19   [provider]  calcium  carbonate (TUMS - DOSED IN MG ELEMENTAL CALCIUM ) 500 MG chewable tablet Chew 2 tablets (400 mg of elemental calcium  total) by mouth 3 (three) times daily with meals. Patient taking differently: Place 400 mg of elemental calcium  into feeding tube daily as needed for indigestion or heartburn. 04/18/22   [name], DO  carvedilol  (COREG ) 3.125 MG tablet Place 3.125 mg into feeding tube 2 (two) times daily with a meal.    [provider]  cetirizine (ZYRTEC) 10 MG tablet Place 10 mg into feeding tube daily.    [provider]  clonazePAM  (KLONOPIN ) 1 MG disintegrating tablet Place 1 mg into feeding tube daily as needed (Anxiety). 03/10/22   [provider]  dapagliflozin  propanediol (FARXIGA ) 10 MG TABS tablet 10 mg daily. Feeding tube    [provider]  diphenhydrAMINE  (BENADRYL ) 25 MG tablet Take 1 tablet (25 mg total) by mouth every 6 (six) hours. Patient taking differently: Take 25 mg by mouth every 6 (six) hours as needed for itching. 10/29/13   [name], MD  ELIQUIS  5 MG TABS tablet TAKE 1 TABLET(5 MG) BY MOUTH TWICE DAILY 01/21/24   [name], MD  famotidine  (PEPCID ) 40 MG/5ML suspension PLACE 2.5 MILLILITERS INTO THE FEEDING TUBE DAILY 02/05/24   [name], MD  gabapentin  (NEURONTIN ) 300 MG capsule 300 mg 3 (three) times daily. Feeding tube 01/19/18   [provider]  HYDROcodone -acetaminophen  (HYCET) 7.5-325 mg/15 ml solution Take 10-15 mLs by mouth  every 6 (six) hours as needed for moderate pain (pain score 4-6). 02/04/24   [name], MD  hydrocortisone  (ANUSOL -HC) 2.5 % rectal cream Place 1 Application rectally daily as needed for hemorrhoids or anal itching. 04/29/23   [provider]  isosorbide -hydrALAZINE  (BIDIL ) 20-37.5 MG tablet TAKE 1 TABLET BY MOUTH IN THE MORNING AND AT BEDTIME 01/04/24   [name], [name], DO  lactose free nutrition (BOOST) LIQD Take 237 mLs by mouth daily. Patient not taking: Reported on 01/04/2024     [provider]  lidocaine  (XYLOCAINE ) 2 % solution MIX 1 PART(5ML) OF LIDOCAINE  AND 1 PART WATER ( ). SWALLOW OF DILUTED MIXTURE 30 MINUTES BEFORE MEALS AND BEDTIME FOR UP TO FOUR TIMES DAILY 10/20/23   [name], MD  lidocaine -prilocaine  (EMLA ) cream Apply 1 Application topically as needed. Patient not taking: Reported on 01/04/2024 11/30/23   [name], MD  Nutritional Supplements (NUTREN 1.5) LIQD Nutren 1.5 - give 2 cartons TID (1500 ml/day). Flush tube with 60 ml water  before and after each bolus + additional 200 ml free water  QID. Provides 2250 kcal, 102 g, 1146 ml free water  (2306 ml total water ). Meets >85% of needs. 01/19/24   [name], MD  nystatin  (MYCOSTATIN ) 100000 UNIT/ML suspension Take 5 mLs (500,000 Units total) by mouth 4 (four) times daily. 11/25/23   [name], MD  ondansetron  (ZOFRAN ) 8 MG tablet Take 1 tablet (8 mg total) by mouth every 8 (eight) hours as needed for nausea or vomiting. 11/30/23   [name], MD  [facility] VERIO test strip USE TO CHECK BLOOD SUGAR TWICE DAILY 07/20/19   [provider]  polyethylene glycol (MIRALAX  / GLYCOLAX ) 17 g packet Take 17 g by mouth daily. Patient taking differently: Place 17 g into feeding tube daily as needed for mild constipation or moderate constipation. 04/19/22   [name], DO  prochlorperazine  (COMPAZINE ) 10 MG tablet Take 1 tablet (10 mg total) by mouth every 6 (six) hours as needed for nausea or vomiting. 11/30/23   [name], MD  Protein (FEEDING SUPPLEMENT, PROSOURCE TF20,) liquid Place 60 mLs into feeding tube 2 (two) times daily. 06/17/23   Dahal, Chapman, MD  psyllium (HYDROCIL/METAMUCIL) 95 % PACK Take 1 packet by mouth daily. Patient taking differently: Take 1 packet by mouth daily as needed for mild constipation or moderate constipation. 04/15/22   [name], DO  traMADol (ULTRAM) 50 MG tablet Take 50 mg by mouth 3 (three) times daily as needed. 01/08/24   [provider]  Water  For  Irrigation, Sterile (FREE WATER ) SOLN Place 100 mLs into feeding tube every 6 (six) hours. 06/17/23   Arlice Chapman, MD    Allergies: Azilsartan and Advil [ibuprofen]    Review of Systems  All other systems reviewed and are negative.   Updated Vital Signs BP 125/86   Pulse 76   Temp 98.2 F (36.8 C) (Oral)   Resp 19   SpO2 97%   Physical Exam Vitals and nursing note reviewed.   68 year old male, resting comfortably and in no acute distress. Vital signs are normal. Oxygen saturation is 97%, which is normal. Head is normocephalic and atraumatic. PERRLA, EOMI. Oropharynx is clear. Neck is nontender and supple without adenopathy  Lungs are clear without rales, wheezes, or rhonchi. Chest is nontender.  Mediport is present on the right. Heart has regular rate and rhythm without murmur. Abdomen is soft, flat, nontender.  PEG tube is present. Extremities have no cyanosis or edema, full range of motion  is present. Skin is warm and dry without rash. Neurologic: Mental status is normal, cranial nerves are intact, moves all extremities equally.    (all labs ordered are listed, but only abnormal results are displayed) Labs Reviewed  CBC WITH DIFFERENTIAL/PLATELET - Abnormal; Notable for the following components:      Result Value   RBC 4.15 (*)    Hemoglobin 11.3 (*)    HCT 33.9 (*)    RDW 19.6 (*)    All other components within normal limits  BASIC METABOLIC PANEL WITH GFR - Abnormal; Notable for the following components:   Glucose, Bld 126 (*)    Creatinine, Ser 0.50 (*)    All other components within normal limits  TYPE AND SCREEN      Procedures   Medications Ordered in the ED - No data to display                                  Medical Decision Making Amount and/or Complexity of Data Reviewed Labs: ordered.   Bleeding with spitting out blood likely from known esophageal cancer.  This is aggravated by his state of chronic anticoagulation.  I have reviewed his  past records, and note I attempted esophageal dilatation on 01/01/2024.  I have ordered laboratory tests of CBC and basic metabolic panel.  I note from esophagoscopy on 05/27/2023 that his tumor was noted to be friable.  I doubt that bleeding will be able to be managed by endoscopic cautery.  He may need to be evaluated by interventional radiology for selective embolization.  I have reviewed his laboratory tests, and my interpretation is elevated random glucose level, mild anemia with only slight drop in hemoglobin from yesterday.  However, with obvious significant ongoing bleeding, I still feel he will need hospitalization and further evaluation.  I have paged the hospitalist for admission.     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

## 2024-02-06 NOTE — H&P (Signed)
 " History and Physical  Melvin Hudson FMW:[id number] DOB: March 15, 1955 DOA: 02/06/2024  PCP: Haze Kingfisher, MD   Chief Complaint: Oral bleeding  HPI: Melvin Hudson is a 68 y.o. male with medical history significant for obesity, hypertension, DVT on Eliquis  and esophageal cancer status post radiation currently on chemotherapy who is being admitted to the hospital for observation due to 24 hours of spitting up blood.  Patient states that he had a barium swallow study a couple of days ago, since that time he started spitting up blood.  He was at home yesterday and felt his mouth filling up with blood, over the course of the day he spit up approximately 300-400 cc of fresh blood.  He presented to the emergency department yesterday afternoon and was observed closely, it was felt that his bleeding had stopped, he was hemodynamically stable without significant change in his hemoglobin and was discharged home.  After he got home, he continued to spit up blood and came back to the ER this morning for evaluation.  Due to the ongoing bleeding despite the fact that he has remained hemodynamically stable without significant change in his hemoglobin, hospitalist observation was requested.  Currently, the patient has no other complaints such as dizziness, lightheadedness, abdominal pain, nausea, vomiting.  He last took his Eliquis  on the morning of 12/19.  Review of Systems: Please see HPI for pertinent positives and negatives. A complete 10 system review of systems are otherwise negative.  Past Medical History:  Diagnosis Date   Angioedema 09/17/2011   tongue and lips   Arthritis    both of my legs and feet (01/05/2014)   CHF (congestive heart failure) (HCC)    Diabetes mellitus without complication (HCC)    type 2   DVT (deep venous thrombosis) (HCC) 06/16/2023   LLE   Dysphagia    Erectile dysfunction 04/20/2018   Esophageal cancer (HCC)    GI bleed    Heart murmur 04/20/2018   History of  radiation therapy    06/24/2023- 08/03/2023 Dr. 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   Past Surgical History:  Procedure Laterality Date   COLONOSCOPY Left 04/11/2014   Procedure: COLONOSCOPY;  Surgeon: Elsie Cree, MD;  Location: [facility] ENDOSCOPY;  Service: Endoscopy;  Laterality: Left;   COLONOSCOPY N/A 04/20/2014   Procedure: COLONOSCOPY;  Surgeon: Jerrell KYM Sol, MD;  Location: [facility] ENDOSCOPY;  Service: Endoscopy;  Laterality: N/A;   COLONOSCOPY W/ BIOPSIES AND POLYPECTOMY     benign   DIRECT LARYNGOSCOPY  01/05/2014   DIRECT LARYNGOSCOPY N/A 01/05/2014   Procedure: DIRECT LARYNGOSCOPY AND BIOPSY;  Surgeon: Ida Loader, MD;  Location: [facility] OR;  Service: ENT;  Laterality: N/A;   ESOPHAGEAL DILATION N/A 01/01/2024   Procedure: DILATION, ESOPHAGUS;  Surgeon: Loader Ida, MD;  Location: [facility] OR;  Service: ENT;  Laterality: N/A;   ESOPHAGOSCOPY WITH DILITATION N/A 05/27/2023   Procedure: ESOPHAGOSCOPY;  Surgeon: Loader Ida, MD;  Location: [facility] OR;  Service: ENT;  Laterality: N/A;   ESOPHAGOSCOPY WITH DILITATION N/A 10/30/2023   Procedure: ESOPHAGOSCOPY, WITH BIOPSY;  Surgeon: Loader Ida, MD;  Location: [facility] OR;  Service: ENT;  Laterality: N/A;  With possible biopsy   IR GASTROSTOMY TUBE MOD SED  06/12/2023   IR IMAGING  GUIDED PORT INSERTION  11/24/2023   IR RADIOLOGIST EVAL & MGMT  08/10/2023   IR RADIOLOGIST EVAL & MGMT  08/14/2023   KNEE ARTHROSCOPY Right ~ 1994   LEFT AND RIGHT HEART CATHETERIZATION WITH CORONARY/GRAFT ANGIOGRAM N/A 12/31/2010   Procedure: LEFT AND RIGHT HEART CATHETERIZATION WITH EL BILE;  Surgeon: Erick JONELLE Bergamo, MD;  Location: [facility] CATH LAB;  Service: Cardiovascular;  Laterality: N/A;   MULTIPLE EXTRACTIONS WITH ALVEOLOPLASTY N/A 07/08/2013    Procedure: Extraction of tooth #'s 22, 27 with alveoloplasty and bilateal mandibular facial exostoses reductions;  Surgeon: Tanda JULIANNA Fanny, DDS;  Location: [facility] ORS;  Service: Oral Surgery;  Laterality: N/A;   MULTIPLE TOOTH EXTRACTIONS  ~ 2008   Social History:  reports that he quit smoking about 10 years ago. His smoking use included cigarettes and cigars. He started smoking about 52 years ago. He has a 84 pack-year smoking history. He has never used smokeless tobacco. He reports that he does not drink alcohol  and does not use drugs.  Allergies[1]  Family History  Problem Relation Age of Onset   Cancer Mother    Stroke Mother    Hypertension Mother    Diabetes Father    Aneurysm Father    Cancer Sister        breast ca     Prior to Admission medications  Medication Sig Start Date End Date Taking? Authorizing Provider  acetaminophen  (TYLENOL ) 500 MG tablet Take 1,000 mg by mouth 2 (two) times daily.    [provider]  albuterol  (VENTOLIN  HFA) 108 (90 Base) MCG/ACT inhaler Inhale 2 puffs into the lungs every 4 (four) hours as needed for wheezing (Cough). 04/02/23   [provider]  allopurinol  (ZYLOPRIM ) 300 MG tablet Place 300 mg into feeding tube daily as needed (Gout).    [provider]  atorvastatin  (LIPITOR) 40 MG tablet Take 1 tablet (40 mg total) by mouth at bedtime. Patient taking differently: Place 40 mg into feeding tube at bedtime. 10/07/19 01/04/24  [name], [name], DO  bismuth subsalicylate (PEPTO BISMOL) 262 MG/15ML suspension Place 30 mLs into feeding tube every 6 (six) hours as needed for indigestion or diarrhea or loose stools.    [provider]  Blood Glucose Monitoring Suppl (ONETOUCH VERIO FLEX SYSTEM) w/Device KIT USE TO CHECK BLOOD SUGAR TWICE DAILY 04/21/19   [provider]  calcium  carbonate (TUMS - DOSED IN MG ELEMENTAL CALCIUM ) 500 MG chewable tablet Chew 2 tablets (400 mg of elemental calcium  total) by mouth 3 (three)  times daily with meals. Patient taking differently: Place 400 mg of elemental calcium  into feeding tube daily as needed for indigestion or heartburn. 04/18/22   [name], DO  carvedilol  (COREG ) 3.125 MG tablet Place 3.125 mg into feeding tube 2 (two) times daily with a meal.    [provider]  cetirizine (ZYRTEC) 10 MG tablet Place 10 mg into feeding tube daily.    [provider]  clonazePAM  (KLONOPIN ) 1 MG disintegrating tablet Place 1 mg into feeding tube daily as needed (Anxiety). 03/10/22   [provider]  dapagliflozin  propanediol (FARXIGA ) 10 MG TABS tablet 10 mg daily. Feeding tube    [provider]  diphenhydrAMINE  (BENADRYL ) 25 MG tablet Take 1 tablet (25 mg total) by mouth every 6 (six) hours. Patient taking differently: Take 25 mg by mouth every 6 (six) hours as needed for itching. 10/29/13   [name], MD  ELIQUIS  5 MG TABS tablet TAKE 1 TABLET(5 MG) BY MOUTH  TWICE DAILY 01/21/24   [name], MD  famotidine  (PEPCID ) 40 MG/5ML suspension PLACE 2.5 MILLILITERS INTO THE FEEDING TUBE DAILY 02/05/24   [name], MD  gabapentin  (NEURONTIN ) 300 MG capsule 300 mg 3 (three) times daily. Feeding tube 01/19/18   [provider]  HYDROcodone -acetaminophen  (HYCET) 7.5-325 mg/15 ml solution Take 10-15 mLs by mouth every 6 (six) hours as needed for moderate pain (pain score 4-6). 02/04/24   [name], MD  hydrocortisone  (ANUSOL -HC) 2.5 % rectal cream Place 1 Application rectally daily as needed for hemorrhoids or anal itching. 04/29/23   [provider]  isosorbide -hydrALAZINE  (BIDIL ) 20-37.5 MG tablet TAKE 1 TABLET BY MOUTH IN THE MORNING AND AT BEDTIME 01/04/24   [name], [name], DO  lactose free nutrition (BOOST) LIQD Take 237 mLs by mouth daily. Patient not taking: Reported on 01/04/2024    [provider]  lidocaine  (XYLOCAINE ) 2 % solution MIX 1 PART(5ML) OF LIDOCAINE  AND 1 PART WATER ( ). SWALLOW OF DILUTED MIXTURE 30 MINUTES  BEFORE MEALS AND BEDTIME FOR UP TO FOUR TIMES DAILY 10/20/23   [name], MD  lidocaine -prilocaine  (EMLA ) cream Apply 1 Application topically as needed. Patient not taking: Reported on 01/04/2024 11/30/23   [name], MD  Nutritional Supplements (NUTREN 1.5) LIQD Nutren 1.5 - give 2 cartons TID (1500 ml/day). Flush tube with 60 ml water  before and after each bolus + additional 200 ml free water  QID. Provides 2250 kcal, 102 g, 1146 ml free water  (2306 ml total water ). Meets >85% of needs. 01/19/24   [name], MD  nystatin  (MYCOSTATIN ) 100000 UNIT/ML suspension Take 5 mLs (500,000 Units total) by mouth 4 (four) times daily. 11/25/23   [name], MD  ondansetron  (ZOFRAN ) 8 MG tablet Take 1 tablet (8 mg total) by mouth every 8 (eight) hours as needed for nausea or vomiting. 11/30/23   [name], MD  [facility] VERIO test strip USE TO CHECK BLOOD SUGAR TWICE DAILY 07/20/19   [provider]  polyethylene glycol (MIRALAX  / GLYCOLAX ) 17 g packet Take 17 g by mouth daily. Patient taking differently: Place 17 g into feeding tube daily as needed for mild constipation or moderate constipation. 04/19/22   [name], DO  prochlorperazine  (COMPAZINE ) 10 MG tablet Take 1 tablet (10 mg total) by mouth every 6 (six) hours as needed for nausea or vomiting. 11/30/23   [name], MD  Protein (FEEDING SUPPLEMENT, PROSOURCE TF20,) liquid Place 60 mLs into feeding tube 2 (two) times daily. 06/17/23   Dahal, Chapman, MD  psyllium (HYDROCIL/METAMUCIL) 95 % PACK Take 1 packet by mouth daily. Patient taking differently: Take 1 packet by mouth daily as needed for mild constipation or moderate constipation. 04/15/22   [name], DO  traMADol (ULTRAM) 50 MG tablet Take 50 mg by mouth 3 (three) times daily as needed. 01/08/24   [provider]  Water  For Irrigation, Sterile (FREE WATER ) SOLN Place 100 mLs into feeding tube every 6 (six) hours. 06/17/23   Arlice Chapman, MD    Physical Exam: BP (!) 141/92 (BP  Location: Left Arm)   Pulse 79   Temp 98.7 F (37.1 C) (Oral)   Resp 18   SpO2 98%  General:  Alert, oriented, calm, in no acute distress sitting up on the edge of the bed, holding emesis bag which contains small flecks of bloody saliva Oropharynx: Essentially clear with only very minimal stigmata of blood, no source of active bleeding seen Cardiovascular: RRR, no murmurs or rubs, no peripheral edema  Respiratory: clear to auscultation bilaterally, no wheezes, no crackles  Abdomen: soft, nontender, PEG tube in place Skin: dry, no rashes  Musculoskeletal: no joint effusions, normal range of motion           Labs on Admission:  Basic Metabolic Panel: Recent Labs  Lab 02/05/24 1724 02/06/24 0623  NA 140 142  K 4.2 4.1  CL 103 105  CO2 29 27  GLUCOSE 126* 126*  BUN 16 13  CREATININE 0.59* 0.50*  CALCIUM  9.2 9.5   Liver Function Tests: No results for input(s): AST, ALT, ALKPHOS, BILITOT, PROT, ALBUMIN in the last 168 hours. No results for input(s): LIPASE, AMYLASE in the last 168 hours. No results for input(s): AMMONIA in the last 168 hours. CBC: Recent Labs  Lab 02/05/24 1724 02/06/24 0623  WBC 7.3 9.5  NEUTROABS 5.3 7.6  HGB 11.4* 11.3*  HCT 34.8* 33.9*  MCV 81.7 81.7  PLT 285 306   Cardiac Enzymes: No results for input(s): CKTOTAL, CKMB, CKMBINDEX, TROPONINI in the last 168 hours. BNP (last 3 results) No results for input(s): BNP in the last 8760 hours.  ProBNP (last 3 results) No results for input(s): PROBNP in the last 8760 hours.  CBG: No results for input(s): GLUCAP in the last 168 hours.  Radiological Exams on Admission: DG Chest 1 View Result Date: 02/05/2024 CLINICAL DATA:  Oral bleeding. EXAM: CHEST  1 VIEW COMPARISON:  CT 01/05/2024 FINDINGS: Right chest port with tip in the SVC. Upper normal heart size. Aortic atherosclerosis. Prominence of the right hilum due to vascular structures on CT. No confluent opacity, large  pleural effusion or pneumothorax. No pulmonary edema. On limited assessment, no acute osseous findings. IMPRESSION: 1. No acute chest findings. 2. Right chest port with tip in the SVC. Electronically Signed   By: Andrea Gasman M.D.   On: 02/05/2024 17:53   DG ESOPHAGUS W SINGLE CM (SOL OR THIN BA) Result Date: 02/04/2024 CLINICAL DATA:  Upper esophageal dysphagia. History of esophageal cancer with prior radiation therapy. EXAM: ESOPHOGRAM/BARIUM SWALLOW TECHNIQUE: Single contrast examination was performed using  thin barium. FLUOROSCOPY: Radiation Exposure Index (as provided by the fluoroscopic device): 2.5 mGy Kerma COMPARISON:  PET of 01/21/2024. FINDINGS: Initial evaluation of the hypopharynx demonstrates a delayed swallow trigger. Prominent cricopharyngeus. With swallows, there is small volume aspiration including on image 189 of series 1. The patient had retained contrast within the oropharynx and was unable to complete the bolus. At this point, decision was made to forego the remainder of the exam until speech pathology evaluation had been performed. IMPRESSION: Initial evaluation of the hypopharynx demonstrating aspiration. Therefore, exam was halted. Recommend further evaluation with speech pathology. These results will be called to the ordering clinician or representative by the Radiologist Assistant, and communication documented in the PACS or [employer]. Electronically Signed   By: Rockey Kilts M.D.   On: 02/04/2024 12:32   Assessment/Plan Melvin Hudson is a 68 y.o. male with medical history significant for obesity, hypertension, DVT on Eliquis  and esophageal cancer status post radiation currently on chemotherapy who is being admitted to the hospital for observation due to 24 hours of spitting up blood.    Oral bleeding-likely from his esophageal cancer which based on most recent PET scan has evidence of recurrence.  He completed radiation therapy approximately 6 months ago.  He had a  biopsy with ENT Dr. Jesus in September which was negative for cancer, after which esophageal dilation was attempted but unsuccessful.  Patient no longer  currently having brisk bleeding, he feels like he has slowed down again.  However since he has continued now for over 24 hours, we will observe him closely in the hospital. -Observation admission -Monitor on telemetry with every 8 hour hemoglobin -Strict n.p.o. for now until bleeding completely subsides -Continue to hold Eliquis  -Discussed with ENT on-call Dr. Llewellyn, recommends urgent IR evaluation for embolization in case of brisk bleeding.  Will also give TXA in that case.  History of DVT-diagnosed April 2025, hold Eliquis  now due to active bleeding, last dose of Eliquis  12/19 AM.  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 Lanny currently with plans to add chemotherapy to his current immunotherapy regimen due to evidence of cancer recurrence on most recent PET scan.  He did complete radiation therapy approximately 6 months ago.  Hyperlipidemia-Lipitor  Hypertension-Coreg   Anxiety-Klonopin  as needed  DVT prophylaxis: SCDs only    Code Status: Full Code  Consults called: Discussed with ENT as above  Admission status: Observation  Time spent: 49 minutes  Emaan Gary CHRISTELLA Gail MD [facility] Pager [phone]  If 7PM-7AM, please contact night-coverage [email or web address] Password [email or web address]  02/06/2024, 9:04 AM      [1]  Allergies Allergen Reactions   Azilsartan Itching    Avoid ARB and ACEI per MD   Advil [Ibuprofen]     Feels jittery   "

## 2024-02-06 NOTE — Plan of Care (Signed)
  Problem: Clinical Measurements: Goal: Will remain free from infection Outcome: Progressing Goal: Diagnostic test results will improve Outcome: Progressing   Problem: Education: Goal: Knowledge of General Education information will improve Description Including pain rating scale, medication(s)/side effects and non-pharmacologic comfort measures Outcome: Not Progressing   Problem: Health Behavior/Discharge Planning: Goal: Ability to manage health-related needs will improve Outcome: Not Progressing   Problem: Clinical Measurements: Goal: Ability to maintain clinical measurements within normal limits will improve Outcome: Not Progressing Goal: Respiratory complications will improve Outcome: Not Progressing

## 2024-02-06 NOTE — ED Notes (Signed)
 Called lab regarding pending labs not yet in process. Labs sent at 624. Per tech - samples received and will start processing shortly

## 2024-02-06 NOTE — ED Triage Notes (Signed)
" °  Patient comes in spitting up blood since yesterday.  Patient has hx of esophageal cancer and had chemo infusion on 12/8.  Was seen yesterday for the same.  Wife states patient has spit up about a liter of bloody sputum.  Denies any pain related to this issue.  Currently on eliquis  but has not taken a dose since yesterday morning.   "

## 2024-02-07 DIAGNOSIS — K1379 Other lesions of oral mucosa: Secondary | ICD-10-CM

## 2024-02-07 DIAGNOSIS — M549 Dorsalgia, unspecified: Secondary | ICD-10-CM | POA: Insufficient documentation

## 2024-02-07 DIAGNOSIS — I82409 Acute embolism and thrombosis of unspecified deep veins of unspecified lower extremity: Secondary | ICD-10-CM | POA: Diagnosis present

## 2024-02-07 DIAGNOSIS — R041 Hemorrhage from throat: Secondary | ICD-10-CM | POA: Diagnosis not present

## 2024-02-07 DIAGNOSIS — E785 Hyperlipidemia, unspecified: Secondary | ICD-10-CM | POA: Diagnosis present

## 2024-02-07 LAB — CBC
HCT: 32.9 % — ABNORMAL LOW (ref 39.0–52.0)
Hemoglobin: 10.8 g/dL — ABNORMAL LOW (ref 13.0–17.0)
MCH: 26.9 pg (ref 26.0–34.0)
MCHC: 32.8 g/dL (ref 30.0–36.0)
MCV: 82 fL (ref 80.0–100.0)
Platelets: 283 K/uL (ref 150–400)
RBC: 4.01 MIL/uL — ABNORMAL LOW (ref 4.22–5.81)
RDW: 19.2 % — ABNORMAL HIGH (ref 11.5–15.5)
WBC: 8.1 K/uL (ref 4.0–10.5)
nRBC: 0.2 % (ref 0.0–0.2)

## 2024-02-07 LAB — BASIC METABOLIC PANEL WITH GFR
Anion gap: 9 (ref 5–15)
BUN: 16 mg/dL (ref 8–23)
CO2: 28 mmol/L (ref 22–32)
Calcium: 9.3 mg/dL (ref 8.9–10.3)
Chloride: 107 mmol/L (ref 98–111)
Creatinine, Ser: 0.58 mg/dL — ABNORMAL LOW (ref 0.61–1.24)
GFR, Estimated: >60 mL/min
Glucose, Bld: 109 mg/dL — ABNORMAL HIGH (ref 70–99)
Potassium: 3.7 mmol/L (ref 3.5–5.1)
Sodium: 144 mmol/L (ref 135–145)

## 2024-02-07 MED ORDER — NUTREN 1.5 EN LIQD: 1500.0000 mL | Freq: Three times a day (TID) | ENTERAL | Status: DC

## 2024-02-07 MED ORDER — OSMOLITE 1.5 CAL PO LIQD
474.0000 mL | Freq: Three times a day (TID) | ORAL | Status: DC
  Administered 2024-02-07 – 2024-02-08 (×3): 474 mL
  Filled 2024-02-07 (×5): qty 474

## 2024-02-07 MED ORDER — ISOSORB DINITRATE-HYDRALAZINE 20-37.5 MG PO TABS
1.0000 | ORAL_TABLET | Freq: Two times a day (BID) | ORAL | Status: DC
  Administered 2024-02-07 – 2024-02-08 (×3): 1
  Filled 2024-02-07 (×3): qty 1

## 2024-02-07 MED ORDER — PSYLLIUM 95 % PO PACK: 1.0000 | PACK | Freq: Every day | ORAL | Status: DC | PRN

## 2024-02-07 MED ORDER — FAMOTIDINE 40 MG/5ML PO SUSR
20.0000 mg | Freq: Every day | ORAL | Status: DC
  Administered 2024-02-07 – 2024-02-08 (×2): 20 mg
  Filled 2024-02-07 (×2): qty 2.5

## 2024-02-07 MED ORDER — GABAPENTIN 300 MG/6ML PO SOLN
300.0000 mg | Freq: Two times a day (BID) | ORAL | Status: DC
  Administered 2024-02-07: 300 mg
  Filled 2024-02-07 (×3): qty 6

## 2024-02-07 MED ORDER — OSMOLITE 1.5 CAL PO LIQD
500.0000 mL | Freq: Three times a day (TID) | ORAL | Status: DC
  Filled 2024-02-07: qty 711

## 2024-02-07 NOTE — Assessment & Plan Note (Addendum)
-   Continue carvedilol

## 2024-02-07 NOTE — Care Management Important Message (Signed)
 Important Message  Patient Details  Name: Melvin Hudson MRN: [id number] Date of Birth: May 09, 1955   Important Message Given:        Lorraine LILLETTE Fenton, LCSW 02/07/2024, 3:47 PM

## 2024-02-07 NOTE — Assessment & Plan Note (Addendum)
 Likely from his esophageal cancer, which based on most recent PET scan has evidence of recurrence He completed radiation therapy approximately 6 months ago He had a biopsy with ENT Dr. Jesus in September which was negative for cancer, after which esophageal dilation was attempted but unsuccessful Patient no longer currently having brisk bleeding, he feels like he has slowed down again However he continues to worry that it will recur Continue to observe for 1 additional night Resume tube feeds Continue to hold Eliquis  for 5 days Discussed with ENT on-call Dr. Llewellyn, recommends urgent IR evaluation for embolization in case of brisk bleeding.  Will also give TXA in that case. Recheck CBC in AM

## 2024-02-07 NOTE — Assessment & Plan Note (Addendum)
 Diagnosed April 2025 Last Eliquis  dose was 12/19 AM Hold Eliquis  now due to active bleeding, would hold for at least 5 days

## 2024-02-07 NOTE — Plan of Care (Signed)
  Problem: Clinical Measurements: Goal: Ability to maintain clinical measurements within normal limits will improve Outcome: Progressing Goal: Will remain free from infection Outcome: Progressing Goal: Diagnostic test results will improve Outcome: Progressing   Problem: Coping: Goal: Level of anxiety will decrease Outcome: Progressing   Problem: Pain Managment: Goal: General experience of comfort will improve and/or be controlled Outcome: Progressing   Problem: Safety: Goal: Ability to remain free from injury will improve Outcome: Progressing   Problem: Skin Integrity: Goal: Risk for impaired skin integrity will decrease Outcome: Progressing

## 2024-02-07 NOTE — Care Management Obs Status (Signed)
 MEDICARE OBSERVATION STATUS NOTIFICATION   Patient Details  Name: Melvin Hudson MRN: [id number] Date of Birth: [birth year]   Medicare Observation Status Notification Given:  Yes    [name], LCSW 02/07/2024, 3:46 PM

## 2024-02-07 NOTE — Assessment & Plan Note (Signed)
 Reports chronic LBP and recent R shoulder pain with radiation through the upper R chest Recent PET scan without obvious lytic lesions Supportive care

## 2024-02-07 NOTE — Assessment & Plan Note (Addendum)
 Body mass index is 31.48 kg/m.SABRA  Weight loss should be encouraged Outpatient PCP/bariatric medicine f/u encouraged Significantly low or high BMI is associated with higher medical risk including morbidity and mortality

## 2024-02-07 NOTE — Progress Notes (Addendum)
 " Progress Note   Patient: Melvin Hudson FMW:[id number] DOB: [birth year] DOA: 02/06/2024     0 DOS: the patient was seen and examined on 02/07/2024   Brief hospital course: 68yo with h/o HTN, DVT on Eliquis , and esophageal cancer s/p radiation and currently on chemotherapy who presented on 12/20 with spitting up blood.  He was seen in the ER for this issue on 12/19, appeared to have improved, and was discharged but it recurred and so he returned to the ER.  Last Eliquis  was 12/19.     Assessment & Plan Oral bleeding Likely from his esophageal cancer, which based on most recent PET scan has evidence of recurrence He completed radiation therapy approximately 6 months ago He had a biopsy with ENT Dr. Jesus in September which was negative for cancer, after which esophageal dilation was attempted but unsuccessful Patient no longer currently having brisk bleeding, he feels like he has slowed down again However he continues to worry that it will recur Continue to observe for 1 additional night Resume tube feeds Continue to hold Eliquis  for 5 days Discussed with ENT on-call Dr. Llewellyn, recommends urgent IR evaluation for embolization in case of brisk bleeding.  Will also give TXA in that case. 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 Lanny Plan to change to FOLFOX plus Tevimbra due to evidence of cancer recurrence on most recent PET scan He did complete radiation therapy approximately 6 months ago On tube feeds for nutrition DVT (deep venous thrombosis) (HCC) Diagnosed April 2025 Last Eliquis  dose was 12/19 AM Hold Eliquis  now due to active bleeding, would hold for at least 5 days HTN (hypertension) Continue carvedilol  Anxiety state Continue Klonopin  as needed Dyslipidemia Continue atorvastatin  Back pain Reports chronic LBP and recent R shoulder pain with radiation through the upper R chest Recent PET scan without obvious lytic  lesions Supportive care Class 1 obesity due to excess calories with body mass index (BMI) of 31.0 to 31.9 in adult Body mass index is 31.48 kg/m.SABRA  Weight loss should be encouraged Outpatient PCP/bariatric medicine f/u encouraged Significantly low or high BMI is associated with higher medical risk including morbidity and mortality       Consultants: ENT - telephone only  Procedures: None  Antibiotics: None      Subjective: Feeling better, bleeding has slowed.  He is nervous about recurrent bleeding and would like to stay one more night.   Objective: Vitals:   02/07/24 0552 02/07/24 0800  BP: 138/87 133/87  Pulse: 75 77  Resp: 18   Temp: 98.6 F (37 C)   SpO2: 97%     Intake/Output Summary (Last 24 hours) at 02/07/2024 1128 Last data filed at 02/07/2024 1059 Gross per 24 hour  Intake 310 ml  Output 680 ml  Net -370 ml   Filed Weights   02/06/24 1309  Weight: 111.2 kg    Exam:  General:  Appears calm and comfortable and is in NAD Eyes:  normal lids, iris ENT:  grossly normal hearing, lips & tongue, mmm; edentulous Cardiovascular:  RRR. No LE edema.  Respiratory:   CTA bilaterally with no wheezes/rales/rhonchi.  Normal respiratory effort. Abdomen:  soft, NT, ND; + PEG tube Skin:  no rash or induration seen on limited exam Musculoskeletal:  grossly normal tone BUE/BLE, good ROM, no bony abnormality Psychiatric:  grossly normal mood and affect, speech fluent and appropriate, AOx3 Neurologic:  CN 2-12 grossly intact, moves all extremities in coordinated  fashion  Data Reviewed: I have reviewed the patient's lab results since admission.  Pertinent labs for today include:   Unremarkable BMP WBC 8.1 Hgb 10.8, stable     Family Communication: None present     Code Status: Full Code  Disposition: Status is: Observation The patient remains OBS appropriate and will d/c before 2 midnights.     Time spent: 50 minutes  Unresulted Labs (From  admission, onward)     Start     Ordered   02/08/24 0500  CBC  Tomorrow morning,   R       Question:  Specimen collection method  Answer:  Unit=Unit collect   02/07/24 1124             Author: Delon Herald, MD 02/07/2024 11:28 AM  For on call review [email or web address].            "

## 2024-02-07 NOTE — Assessment & Plan Note (Addendum)
 Followed by Dr. Lanny Plan to change to FOLFOX plus Tevimbra due to evidence of cancer recurrence on most recent PET scan He did complete radiation therapy approximately 6 months ago On tube feeds for nutrition

## 2024-02-07 NOTE — Hospital Course (Signed)
 [phone] with h/o HTN, DVT on Eliquis , and esophageal cancer s/p radiation and currently on chemotherapy who presented on 12/20 with spitting up blood.  He was seen in the ER for this issue on 12/19, appeared to have improved, and was discharged but it recurred and so he returned to the ER.  Last Eliquis  was 12/19.

## 2024-02-07 NOTE — Plan of Care (Signed)
  Problem: Education: Goal: Knowledge of General Education information will improve Description: Including pain rating scale, medication(s)/side effects and non-pharmacologic comfort measures Outcome: Progressing   Problem: Health Behavior/Discharge Planning: Goal: Ability to manage health-related needs will improve Outcome: Progressing   Problem: Clinical Measurements: Goal: Cardiovascular complication will be avoided Outcome: Progressing   Problem: Clinical Measurements: Goal: Ability to maintain clinical measurements within normal limits will improve Outcome: Not Progressing Goal: Will remain free from infection Outcome: Not Progressing Goal: Diagnostic test results will improve Outcome: Not Progressing Goal: Respiratory complications will improve Outcome: Not Progressing

## 2024-02-07 NOTE — Assessment & Plan Note (Addendum)
 Continue Klonopin as needed.

## 2024-02-07 NOTE — Assessment & Plan Note (Addendum)
 Continue atorvastatin 

## 2024-02-08 ENCOUNTER — Telehealth: Payer: Self-pay

## 2024-02-08 DIAGNOSIS — R041 Hemorrhage from throat: Secondary | ICD-10-CM | POA: Diagnosis not present

## 2024-02-08 DIAGNOSIS — K1379 Other lesions of oral mucosa: Secondary | ICD-10-CM | POA: Diagnosis not present

## 2024-02-08 LAB — CBC
HCT: 32.5 % — ABNORMAL LOW (ref 39.0–52.0)
Hemoglobin: 10.7 g/dL — ABNORMAL LOW (ref 13.0–17.0)
MCH: 27 pg (ref 26.0–34.0)
MCHC: 32.9 g/dL (ref 30.0–36.0)
MCV: 82.1 fL (ref 80.0–100.0)
Platelets: 291 K/uL (ref 150–400)
RBC: 3.96 MIL/uL — ABNORMAL LOW (ref 4.22–5.81)
RDW: 19.3 % — ABNORMAL HIGH (ref 11.5–15.5)
WBC: 8.4 K/uL (ref 4.0–10.5)
nRBC: 0.4 % — ABNORMAL HIGH (ref 0.0–0.2)

## 2024-02-08 MED ORDER — LIDOCAINE-PRILOCAINE 2.5-2.5 % EX CREA: 1.0000 | TOPICAL_CREAM | CUTANEOUS | 0 refills | Status: AC | PRN

## 2024-02-08 MED ORDER — HEPARIN SOD (PORK) LOCK FLUSH 100 UNIT/ML IV SOLN
INTRAVENOUS | Status: AC
  Filled 2024-02-08: qty 5

## 2024-02-08 MED ORDER — HEPARIN SOD (PORK) LOCK FLUSH 100 UNIT/ML IV SOLN
500.0000 [IU] | Freq: Once | INTRAVENOUS | Status: AC
  Administered 2024-02-08: 500 [IU] via INTRAVENOUS

## 2024-02-08 NOTE — Assessment & Plan Note (Addendum)
 Continue carvedilol  Resume Farxiga 

## 2024-02-08 NOTE — Progress Notes (Signed)
 Discharge instructions were reviewed with the patient. He denied questions/concerns related to discharge. No change from am assessment. G-TUBE site clean dry and intact.

## 2024-02-08 NOTE — Assessment & Plan Note (Addendum)
 Body mass index is 31.48 kg/m.SABRA  Weight loss should be encouraged Outpatient PCP/bariatric medicine f/u encouraged Significantly low or high BMI is associated with higher medical risk including morbidity and mortality

## 2024-02-08 NOTE — Assessment & Plan Note (Addendum)
 Continue atorvastatin 

## 2024-02-08 NOTE — Assessment & Plan Note (Addendum)
 Followed by Dr. Lanny Plan to change to FOLFOX plus Tevimbra due to evidence of cancer recurrence on most recent PET scan He did complete radiation therapy approximately 6 months ago On tube feeds for nutrition

## 2024-02-08 NOTE — Discharge Summary (Signed)
 " Physician Discharge Summary   Patient: Melvin Hudson MRN: [id number] DOB: 16-Aug-1955  Admit date:     02/06/2024  Discharge date: 02/08/2024  Discharge Physician: Delon Herald   PCP: Haze Kingfisher, MD   Recommendations at discharge:   Hold Eliquis  for at least 5 days Follow up with Dr. Lanny as directed Follow up with Dr. 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  He was seen in the ER for this issue on 12/19, appeared to have improved, and was discharged but it recurred and so he returned to the ER.  Last Eliquis  was 12/19.  Assessment and Plan:  Assessment & Plan Oral bleeding Likely from his esophageal cancer, which based on most recent PET scan has evidence of recurrence He completed radiation therapy approximately 6 months ago He had a biopsy with ENT Dr. 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 and recent R shoulder pain with radiation through the upper R chest Recent PET scan without obvious lytic lesions Supportive care Class 1 obesity due to excess calories with body mass index (BMI) of 31.0 to 31.9 in adult Body mass index is 31.48 kg/m.SABRA  Weight loss should be encouraged Outpatient PCP/bariatric medicine f/u encouraged Significantly low or high BMI is associated with higher medical risk including morbidity and mortality      Consultants: ENT - telephone only   Procedures: None   Antibiotics: None   Pain control - [place]  Controlled Substance Reporting System database was reviewed. and patient was instructed, not to drive, operate heavy machinery, perform activities at heights, swimming or participation in water  activities or provide baby-sitting services while on Pain, Sleep and Anxiety Medications; until their outpatient Physician has advised to do so again. Also recommended to not to take more than prescribed Pain, Sleep and Anxiety Medications.    Disposition: Home Diet recommendation:  NPO on tube feeds DISCHARGE MEDICATION: Allergies as of 02/08/2024       Reactions   Azilsartan Itching, Other (See Comments)   Avoid ARB and ACEI, -per MD   Advil [ibuprofen] Anxiety, Other (See Comments)   Felt jittery        Medication  List     PAUSE taking these medications    Eliquis  5 MG Tabs tablet Wait to take this until your doctor or other care provider tells you to start again. Generic drug: apixaban  TAKE 1 TABLET(5 MG) BY MOUTH TWICE DAILY       STOP taking these medications    feeding supplement (PROSource TF20) liquid   lidocaine  2 %  solution Commonly known as: XYLOCAINE    nystatin  100000 UNIT/ML suspension Commonly known as: MYCOSTATIN        TAKE these medications    albuterol  108 (90 Base) MCG/ACT inhaler Commonly known as: VENTOLIN  HFA Inhale 2 puffs into the lungs every 4 (four) hours as needed for wheezing (or coughing).   allopurinol  300 MG tablet Commonly known as: ZYLOPRIM  Place 300 mg into feeding tube daily as needed (for gout flares).   atorvastatin  40 MG tablet Commonly known as: LIPITOR Take 1 tablet (40 mg total) by mouth at bedtime. What changed: how to take this   bismuth subsalicylate 262 MG/15ML suspension Commonly known as: PEPTO BISMOL Place 30 mLs into feeding tube every 6 (six) hours as needed for indigestion or diarrhea or loose stools.   calcium  carbonate 500 MG chewable tablet Commonly known as: TUMS - dosed in mg elemental calcium  Chew 2 tablets (400 mg of elemental calcium  total) by mouth 3 (three) times daily with meals. What changed:  how to take this when to take this reasons to take this   carvedilol  3.125 MG tablet Commonly known as: COREG  Place 3.125 mg into feeding tube 2 (two) times daily with a meal.   cetirizine 10 MG tablet Commonly known as: ZYRTEC Place 10 mg into feeding tube daily as needed for allergies or rhinitis.   clonazePAM  1 MG disintegrating tablet Commonly known as: KLONOPIN  Place 1 mg into feeding tube daily as needed (for anxiety).   diphenhydrAMINE  25 MG tablet Commonly known as: BENADRYL  Take 1 tablet (25 mg total) by mouth every 6 (six) hours. What changed:  when to take this reasons to take this   famotidine  40 MG/5ML suspension Commonly known as: PEPCID  PLACE 2.5 MILLILITERS INTO THE FEEDING TUBE DAILY What changed: See the new instructions.   Farxiga  10 MG Tabs tablet Generic drug: dapagliflozin  propanediol 10 mg See admin instructions. 10 mg, per tube, once a day   free water  Soln Place 100 mLs into feeding tube every 6  (six) hours.   gabapentin  300 MG capsule Commonly known as: NEURONTIN  Place 300 mg into feeding tube 2 (two) times daily. Feeding tube   HYDROcodone -acetaminophen  7.5-325 mg/15 ml solution Commonly known as: HYCET Take 10-15 mLs by mouth every 6 (six) hours as needed for moderate pain (pain score 4-6). What changed: when to take this   hydrocortisone  2.5 % rectal cream Commonly known as: ANUSOL -HC Place 1 Application rectally daily as needed for hemorrhoids or anal itching.   isosorbide -hydrALAZINE  20-37.5 MG tablet Commonly known as: BIDIL  TAKE 1 TABLET BY MOUTH IN THE MORNING AND AT BEDTIME What changed: how to take this   lidocaine -prilocaine  cream Commonly known as: EMLA  Apply 1 Application topically as needed (as directed).   Nutren 1.5 Liqd Nutren 1.5 - give 2 cartons TID (1500 ml/day). Flush tube with 60 ml water  before and after each bolus + additional 200 ml free water  QID. Provides 2250 kcal, 102 g, 1146 ml free water  (2306 ml total water ). Meets >85% of needs.   ondansetron  8 MG tablet Commonly known as: ZOFRAN  Take 1 tablet (8  mg total) by mouth every 8 (eight) hours as needed for nausea or vomiting. What changed: how to take this   OneTouch Verio Flex System w/Device Kit USE TO CHECK BLOOD SUGAR TWICE DAILY   OneTouch Verio test strip Generic drug: glucose blood USE TO CHECK BLOOD SUGAR TWICE DAILY   polyethylene glycol 17 g packet Commonly known as: MIRALAX  / GLYCOLAX  Take 17 g by mouth daily. What changed:  how to take this when to take this reasons to take this   prochlorperazine  10 MG tablet Commonly known as: COMPAZINE  Take 1 tablet (10 mg total) by mouth every 6 (six) hours as needed for nausea or vomiting. What changed: how to take this   psyllium 95 % Pack Commonly known as: HYDROCIL/METAMUCIL Take 1 packet by mouth daily. What changed:  when to take this reasons to take this        Follow-up Information     [name], MD Follow up.    Specialties: Hematology, Oncology Contact information: [address] [place] [address] [phone]         Haze Kingfisher, MD. Schedule an appointment as soon as possible for a visit in 1 week(s).   Specialty: Family Medicine Contact information: [address] [place] [address] [phone]                Discharge Exam:    Subjective: Feeling ok, bleeding has slowed down and he feels comfortable with dc today.   Objective: Vitals:   02/07/24 2146 02/08/24 0514  BP: 122/88 111/78  Pulse: 80 80  Resp:  16  Temp: 98.2 F (36.8 C) 97.6 F (36.4 C)  SpO2: 97% 94%    Intake/Output Summary (Last 24 hours) at 02/08/2024 0804 Last data filed at 02/08/2024 0600 Gross per 24 hour  Intake 784 ml  Output --  Net 784 ml   Filed Weights   02/06/24 1309  Weight: 111.2 kg    Exam:  General:  Appears calm and comfortable and is in NAD Eyes:  normal lids, iris ENT:  grossly normal hearing, lips & tongue, mmm; edentulous Cardiovascular:  RRR. No LE edema.  Respiratory:   CTA bilaterally with no wheezes/rales/rhonchi.  Normal respiratory effort. Abdomen:  soft, NT, ND Skin:  no rash or induration seen on limited exam Musculoskeletal:  grossly normal tone BUE/BLE, good ROM, no bony abnormality Psychiatric:  grossly normal mood and affect, speech fluent and appropriate, AOx3 Neurologic:  CN 2-12 grossly intact, moves all extremities in coordinated fashion, ambulating with a cane in the hall  Data Reviewed: I have reviewed the patient's lab results since admission.  Pertinent labs for today include:   Stable CBC    Condition at discharge: improving  The results of significant diagnostics from this hospitalization (including imaging, microbiology, ancillary and laboratory) are listed below for reference.   Imaging Studies: DG Chest 1 View Result Date: 02/05/2024 CLINICAL DATA:  Oral bleeding. EXAM: CHEST  1 VIEW COMPARISON:  CT  01/05/2024 FINDINGS: Right chest port with tip in the SVC. Upper normal heart size. Aortic atherosclerosis. Prominence of the right hilum due to vascular structures on CT. No confluent opacity, large pleural effusion or pneumothorax. No pulmonary edema. On limited assessment, no acute osseous findings. IMPRESSION: 1. No acute chest findings. 2. Right chest port with tip in the SVC. Electronically Signed   By: Andrea Gasman M.D.   On: 02/05/2024 17:53   DG ESOPHAGUS W SINGLE CM (SOL OR THIN BA) Result Date:  02/04/2024 CLINICAL DATA:  Upper esophageal dysphagia. History of esophageal cancer with prior radiation therapy. EXAM: ESOPHOGRAM/BARIUM SWALLOW TECHNIQUE: Single contrast examination was performed using  thin barium. FLUOROSCOPY: Radiation Exposure Index (as provided by the fluoroscopic device): 2.5 mGy Kerma COMPARISON:  PET of 01/21/2024. FINDINGS: Initial evaluation of the hypopharynx demonstrates a delayed swallow trigger. Prominent cricopharyngeus. With swallows, there is small volume aspiration including on image 189 of series 1. The patient had retained contrast within the oropharynx and was unable to complete the bolus. At this point, decision was made to forego the remainder of the exam until speech pathology evaluation had been performed. IMPRESSION: Initial evaluation of the hypopharynx demonstrating aspiration. Therefore, exam was halted. Recommend further evaluation with speech pathology. These results will be called to the ordering clinician or representative by the Radiologist Assistant, and communication documented in the PACS or [employer]. Electronically Signed   By: Rockey Kilts M.D.   On: 02/04/2024 12:32   NM PET Image Restag (PS) Skull Base To Thigh Result Date: 01/21/2024 CLINICAL DATA:  Subsequent treatment strategy for esophageal cancer. Progressive thoracic adenopathy on CT. EXAM: NUCLEAR MEDICINE PET SKULL BASE TO THIGH TECHNIQUE: 12.17 mCi F-18 FDG was injected  intravenously. Full-ring PET imaging was performed from the skull base to thigh after the radiotracer. CT data was obtained and used for attenuation correction and anatomic localization. Fasting blood glucose: 100 mg/dl COMPARISON:  Chest CTA 01/03/2024, PET-CT 10/23/2023 and 06/11/2023, and chest CT 04/28/2023. FINDINGS: Mediastinal blood pool activity: SUV max 2.9 NECK: No hypermetabolic cervical lymph nodes are identified. No suspicious activity identified within the pharyngeal mucosal space. Incidental CT findings: none CHEST: As seen on recent chest CT, there is progressive circumferential wall thickening of the esophagus extending from the level of the thyroid  cartilage to the subcarinal region. There is associated diffuse hypermetabolic activity in these areas, extending approximately 13 cm in length. Proximally, this demonstrates an SUV max of 19.8 (previously 8.8). Distally, there is an SUV max of 26.4 at the level of the carina (previously not hypermetabolic). No discretely enlarged or hypermetabolic mediastinal, hilar or axillary lymph nodes. No hypermetabolic pulmonary activity or suspicious nodularity. Incidental CT findings: Right IJ Port-A-Cath extends into the upper right atrium. Atherosclerosis of the aorta, great vessels and coronary arteries. ABDOMEN/PELVIS: There is no hypermetabolic activity within the liver, adrenal glands, spleen or pancreas. There is no hypermetabolic nodal activity in the abdomen or pelvis. Mildly prominent activity near the hepatic flexure of the colon (SUV max 5.8, previously 3.5). No obvious corresponding abnormality on the CT images. There is physiologic activity associated with the percutaneous gastrostomy tube, similar to previous study. Incidental CT findings: Aortoiliac atherosclerosis. Distal colonic diverticulosis. SKELETON: There is no hypermetabolic activity to suggest osseous metastatic disease. Incidental CT findings: Heterogeneous sclerosis and trabecular  thickening in the pelvis again noted, most consistent with Paget's disease. Mild spondylosis. IMPRESSION: 1. Progressive circumferential wall thickening of the esophagus with associated diffuse hypermetabolic activity concerning for progressive esophageal cancer. 2. No evidence of adjacent hypermetabolic thoracic adenopathy or distant metastatic disease. 3. Mildly prominent activity near the hepatic flexure of the colon without obvious corresponding abnormality on the CT images. This is nonspecific, and could be physiologic. Correlate with colon cancer screening history. 4.  Aortic Atherosclerosis (ICD10-I70.0). Electronically Signed   By: Elsie Perone M.D.   On: 01/21/2024 15:16    Microbiology: Results for orders placed or performed during the hospital encounter of 04/13/22  Resp panel by RT-PCR (RSV, Flu A&B, Covid)  Anterior Nasal Swab     Status: None   Collection Time: 04/13/22  4:20 PM   Specimen: Anterior Nasal Swab  Result Value Ref Range Status   SARS Coronavirus 2 by RT PCR NEGATIVE NEGATIVE Final   Influenza A by PCR NEGATIVE NEGATIVE Final   Influenza B by PCR NEGATIVE NEGATIVE Final    Comment: (NOTE) The Xpert Xpress SARS-CoV-2/FLU/RSV plus assay is intended as an aid in the diagnosis of influenza from Nasopharyngeal swab specimens and should not be used as a sole basis for treatment. Nasal washings and aspirates are unacceptable for Xpert Xpress SARS-CoV-2/FLU/RSV testing.  Fact Sheet for Patients: bloggercourse.com  Fact Sheet for Healthcare Providers: seriousbroker.it  This test is not yet approved or cleared by the [place]  FDA and has been authorized for detection and/or diagnosis of SARS-CoV-2 by FDA under an Emergency Use Authorization (EUA). This EUA will remain in effect (meaning this test can be used) for the duration of the COVID-19 declaration under Section 564(b)(1) of the Act, 21 U.S.C. section  360bbb-3(b)(1), unless the authorization is terminated or revoked.     Resp Syncytial Virus by PCR NEGATIVE NEGATIVE Final    Comment: (NOTE) Fact Sheet for Patients: bloggercourse.com  Fact Sheet for Healthcare Providers: seriousbroker.it  This test is not yet approved or cleared by the [place]  FDA and has been authorized for detection and/or diagnosis of SARS-CoV-2 by FDA under an Emergency Use Authorization (EUA). This EUA will remain in effect (meaning this test can be used) for the duration of the COVID-19 declaration under Section 564(b)(1) of the Act, 21 U.S.C. section 360bbb-3(b)(1), unless the authorization is terminated or revoked.  Performed at [facility], [address], [place], [place] [address]     Labs: CBC: Recent Labs  Lab 02/05/24 1724 02/06/24 0623 02/06/24 1152 02/06/24 1823 02/07/24 0300 02/08/24 0533  WBC 7.3 9.5  --   --  8.1 8.4  NEUTROABS 5.3 7.6  --   --   --   --   HGB 11.4* 11.3* 10.8* 10.9* 10.8* 10.7*  HCT 34.8* 33.9* 33.1* 33.0* 32.9* 32.5*  MCV 81.7 81.7  --   --  82.0 82.1  PLT 285 306  --   --  283 291   Basic Metabolic Panel: Recent Labs  Lab 02/05/24 1724 02/06/24 0623 02/07/24 0300  NA 140 142 144  K 4.2 4.1 3.7  CL 103 105 107  CO2 29 27 28   GLUCOSE 126* 126* 109*  BUN 16 13 16   CREATININE 0.59* 0.50* 0.58*  CALCIUM  9.2 9.5 9.3   Liver Function Tests: No results for input(s): AST, ALT, ALKPHOS, BILITOT, PROT, ALBUMIN in the last 168 hours. CBG: No results for input(s): GLUCAP in the last 168 hours.  Discharge time spent: greater than 30 minutes.  Signed: Delon Herald, MD [facility] 02/08/2024 "

## 2024-02-08 NOTE — Telephone Encounter (Signed)
 Pt called stating he's going to be d/c from the hospital today and stated he needs to schedule a f/u appt per d/c papers.  Stated this nurse will have Dr Demetra scheduler to contact you to get you scheduled for a hospital f/u.

## 2024-02-08 NOTE — Plan of Care (Signed)
  Problem: Clinical Measurements: Goal: Ability to maintain clinical measurements within normal limits will improve Outcome: Progressing   Problem: Nutrition: Goal: Adequate nutrition will be maintained Outcome: Progressing   Problem: Pain Managment: Goal: General experience of comfort will improve and/or be controlled Outcome: Progressing

## 2024-02-08 NOTE — Assessment & Plan Note (Addendum)
 Continue Klonopin as needed.

## 2024-02-08 NOTE — Assessment & Plan Note (Addendum)
 Diagnosed April 2025 Last Eliquis  dose was 12/19 AM Hold Eliquis  now due to active bleeding, would hold for at least 5 days

## 2024-02-08 NOTE — Assessment & Plan Note (Addendum)
 Likely from his esophageal cancer, which based on most recent PET scan has evidence of recurrence He completed radiation therapy approximately 6 months ago He had a biopsy with ENT Dr. Jesus in September which was negative for cancer, after which esophageal dilation was attempted but unsuccessful Patient no longer currently having brisk bleeding, he feels like he has slowed down again Appears to have stabilized with decreased bleeding and stable Hgb after holding Eliquis  Resume tube feeds Continue to hold Eliquis  for 5 days or as per oncology

## 2024-02-08 NOTE — Assessment & Plan Note (Addendum)
 Reports chronic LBP and recent R shoulder pain with radiation through the upper R chest Recent PET scan without obvious lytic lesions Supportive care

## 2024-02-10 ENCOUNTER — Ambulatory Visit (HOSPITAL_COMMUNITY)
Admission: RE | Admit: 2024-02-10 | Discharge: 2024-02-10 | Disposition: A | Discharge status: Home / Self Care | Source: Ambulatory Visit | Attending: Cardiovascular Disease | Admitting: Cardiovascular Disease

## 2024-02-10 ENCOUNTER — Encounter: Payer: Self-pay | Admitting: Hematology

## 2024-02-10 DIAGNOSIS — Z86718 Personal history of other venous thrombosis and embolism: Secondary | ICD-10-CM | POA: Insufficient documentation

## 2024-02-10 DIAGNOSIS — R000 Tachycardia, unspecified: Secondary | ICD-10-CM | POA: Insufficient documentation

## 2024-02-10 DIAGNOSIS — D649 Anemia, unspecified: Secondary | ICD-10-CM | POA: Insufficient documentation

## 2024-02-10 DIAGNOSIS — I4510 Unspecified right bundle-branch block: Secondary | ICD-10-CM

## 2024-02-10 DIAGNOSIS — E876 Hypokalemia: Secondary | ICD-10-CM | POA: Diagnosis present

## 2024-02-10 DIAGNOSIS — I5032 Chronic diastolic (congestive) heart failure: Secondary | ICD-10-CM | POA: Diagnosis present

## 2024-02-10 DIAGNOSIS — R0789 Other chest pain: Secondary | ICD-10-CM | POA: Diagnosis present

## 2024-02-10 DIAGNOSIS — I77819 Aortic ectasia, unspecified site: Secondary | ICD-10-CM | POA: Insufficient documentation

## 2024-02-11 LAB — ECHOCARDIOGRAM COMPLETE
Area-P 1/2: 5.38 cm2
S' Lateral: 2.9 cm

## 2024-02-14 ENCOUNTER — Other Ambulatory Visit: Payer: Self-pay | Admitting: Nurse Practitioner

## 2024-02-14 DIAGNOSIS — C153 Malignant neoplasm of upper third of esophagus: Secondary | ICD-10-CM

## 2024-02-14 NOTE — Assessment & Plan Note (Addendum)
-  cTxN1M0, invasive squamous cell carcinoma -Present with progressive dysphagia since December 2024. -EGD on 05/27/2023 which showed a mass in the proximal esophagus, 2 to 3 cm below introitus.  Biopsy confirmed invasive squamous cell carcinoma.  -PET scan showed hypermetabolic lesion in the proximal esophagus, with a positive peritracheal lymph node.  No distant metastasis -Status post PEG feeding tube placement  -He started chemoRT with weekly carbo and Taxol  on 06/24/2023 and completed on 08/03/23 -ED visit for chest pain on 06/29/2023 -repeated upper EGD and biopsy was negative for residual tumor on 10/30/2023 - Given the significant residual hypermetabolic activity on the PET scan and his high risk for recurrence, I recommend nivolumab  for 1 year. He started on 11/30/2023 -PET on 01/21/2024 showed disease progression at primary site, no distant mets. Plan to change chemo to FOLFOX and Tislelizumab Bettyjane) - Will proceed with treatment, nivolumab , as currently scheduled on 02/23/2024. -Will arrange follow-up with Dr.Feng the following week to discuss potential treatment change due to disease progression.

## 2024-02-14 NOTE — Progress Notes (Signed)
 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 -EGD on 05/27/2023 which showed a mass in the proximal esophagus, 2 to 3 cm below introitus.  Biopsy confirmed invasive squamous cell carcinoma.  -PET scan showed hypermetabolic lesion in the proximal esophagus, with a positive peritracheal lymph node.  No distant metastasis -Status post PEG feeding tube placement  -He started chemoRT with weekly carbo and Taxol  on 06/24/2023 and completed on 08/03/23 -ED visit for chest pain on 06/29/2023 -repeated upper EGD and biopsy was negative for residual tumor on 10/30/2023 - Given the significant residual hypermetabolic activity on the PET scan and his high risk for recurrence, I recommend nivolumab  for 1 year. He started on 11/30/2023 -PET on 01/21/2024 showed disease progression at primary site, no distant mets. Plan to change chemo to FOLFOX and Tislelizumab Bettyjane) - Will proceed with treatment, nivolumab , as currently scheduled on 02/23/2024. -Will arrange follow-up with Dr.Feng the following week to discuss potential treatment change due to disease progression.   Abnormal bleeding Patient  hospitalized from 12/20 through 02/08/2024 due to abnormal bleeding, spitting up blood.  Eliquis  was put on hold.  He has not restarted yet.  Blood count is stable with Hgb 11.1 and HCT 33.5.  Iron panel is low/normal.  Iron  is 29 and saturation ratio 12%.  Ferritin is elevated at 387.  No requirement for blood transfusion or iron infusion today.  Will recheck on 02/23/2024, prior to planned treatment.  Recommend he restart Eliquis  today.  New prescription was sent to hospital pharmacy.  Abnormal weight loss Patient unable to tolerate oral nutrition.  Now using feeding tube for protein and caloric intake.  Will meet with nutrition  on 02/23/2024.  Patient complains of severe dry mouth.  We talked about hard candy which may help.  Will need to keep candy in his mouth until it completely dissolves.  Should not chew and swallow.  He voiced understanding and stated he will try.  Esophageal cancer Recent PET scan on 01/21/2024 showed progressive disease.  Discussion during hospitalization and per discharge instructions indicate probable change in therapy to FOLFOX and  Tevimbra.  Treatment plans are currently unchanged and patient medical record.  Will plan to proceed with nivolumab  as currently scheduled 02/23/2024.  He will need to follow-up the following week with Dr.Feng to discuss treatment change.  Plan Reviewed hospitalization records from 02/06/2024 through 02/08/2024. Labs reviewed.  - Improving blood count and elevated ferritin. - CMP unremarkable. PET scan from 01/21/2024 reviewed with patient. - Progression of esophageal cancer. Plan to proceed with nivolumab  as scheduled on 02/23/2024. Will need to have follow-up with Dr.Feng the following week to discuss chemotherapy treatment change.  The patient understands the plans discussed today and is in agreement with them.  He knows to contact our office if he develops concerns prior to his next appointment.  I provided 30 minutes of face-to-face time during this encounter and > 50% was spent counseling as documented under my assessment and plan.  [name], NP  [facility] - A DEPT OF [name] [address] [place] [address] Dept: [phone] Dept Fax: [phone]   No orders of the defined types were placed in this encounter.     CHIEF COMPLAINT:  CC: Upper esophageal squamous cell carcinoma  Current Treatment: Nivolumab  every 4 weeks; pending change to chemotherapy FOLFOX plus Tevimbra  INTERVAL HISTORY:  Melvin Hudson is here today for repeat clinical assessment.  The patient was last seen by Dr. Genny on 01/25/2024.  He was hospitalized from  02/06/2024 through 02/08/2024.  He had presented to the ED on 02/06/2024 with spitting up blood.  With evidence of cancer recurrence on recent PET scan.  Per discharge summary, there is plan to change her treatment to chemotherapy FOLFOX plus Tevimbra.  Continues to be on tube feeds for nutrition.  Eliquis  is held 5 days post discharge due to active oral bleeding.  He had an echocardiogram done on 02/10/2024 evaluate cardiac function prior to change of chemotherapy.  Echo  showed left ventricular function low normal at 53%. The left ventricle has no regional wall motion abnormalities. Left ventricular  diastolic parameters are consistent with Grade I diastolic dysfunction (impaired relaxation). Right ventricular systolic function is normal. The right ventricular size is normal. There is normal pulmonary artery systolic pressure. The mitral valve is normal in structure. Trivial mitral valve regurgitation. No evidence of mitral stenosis. He denies chest pain, chest pressure, or shortness of breath. He denies headaches or visual disturbances. He denies abdominal pain, nausea, vomiting, or changes in bowel or bladder habits.  He denies fevers or chills. He denies pain. He continues to have significant dysphagia. Unable to tolerate anything by mouth. Using feeding tube for nutrition. His weight has decreased 12 pounds over last 2 weeks.  I have reviewed the past medical history, past surgical history, social history and family history with the patient  and they are unchanged from previous note.  ALLERGIES:  is allergic to azilsartan and advil [ibuprofen].  MEDICATIONS:  Current Outpatient Medications  Medication Sig Dispense Refill   Protein (FEEDING SUPPLEMENT, PROSOURCE TF20,) liquid Place 60 mLs into feeding tube daily. 1800 mL 1   albuterol  (VENTOLIN  HFA) 108 (90 Base) MCG/ACT inhaler Inhale 2 puffs into the lungs every 4 (four) hours as needed for wheezing (or coughing).     allopurinol  (ZYLOPRIM ) 300 MG tablet Place 300 mg into feeding tube daily as needed (for gout flares).     atorvastatin  (LIPITOR) 40 MG tablet Take 1 tablet (40 mg total) by mouth at bedtime. (Patient taking differently: Place 40 mg into feeding tube at bedtime.) 90 tablet 1   bismuth subsalicylate (PEPTO BISMOL) 262 MG/15ML suspension Place 30 mLs into feeding tube every 6 (six) hours as needed for indigestion or diarrhea or loose stools.     Blood Glucose Monitoring Suppl (ONETOUCH VERIO FLEX SYSTEM) w/Device KIT USE TO CHECK BLOOD SUGAR TWICE DAILY     calcium  carbonate (TUMS - DOSED IN MG ELEMENTAL CALCIUM ) 500 MG chewable tablet Chew 2 tablets (400 mg of elemental calcium  total) by mouth 3 (three) times daily with meals. (Patient taking differently: Place 400 mg of elemental calcium  into feeding tube daily as needed for indigestion or heartburn.) 180 tablet 1   carvedilol  (COREG ) 3.125 MG tablet Place 3.125 mg into feeding tube 2 (two) times daily with a meal.     cetirizine (ZYRTEC) 10 MG tablet  Place 10 mg into feeding tube daily as needed for allergies or rhinitis.     clonazePAM  (KLONOPIN ) 1 MG disintegrating tablet Place 1 mg into feeding tube daily as needed (for anxiety).     dapagliflozin  propanediol (FARXIGA ) 10 MG TABS tablet 10 mg See admin instructions. 10 mg, per tube, once a day     diphenhydrAMINE  (BENADRYL ) 25 MG tablet Take 1 tablet (25 mg total) by mouth every 6 (six) hours. (Patient taking differently: Take 25 mg by mouth every 6 (six) hours as  needed for itching.) 20 tablet 0   [Paused] ELIQUIS  5 MG TABS tablet TAKE 1 TABLET(5 MG) BY MOUTH TWICE DAILY (Patient not taking: Reported on 02/06/2024) 60 tablet 1   famotidine  (PEPCID ) 40 MG/5ML suspension PLACE 2.5 MILLILITERS INTO THE FEEDING TUBE DAILY (Patient taking differently: Place 20 mg into feeding tube daily before breakfast.) 50 mL 1   gabapentin  (NEURONTIN ) 300 MG capsule Place 300 mg into feeding tube 2 (two) times daily. Feeding tube  2   HYDROcodone -acetaminophen  (HYCET) 7.5-325 mg/15 ml solution Take 10-15 mLs by mouth every 6 (six) hours as needed for moderate pain (pain score 4-6). 473 mL 0   hydrocortisone  (ANUSOL -HC) 2.5 % rectal cream Place 1 Application rectally daily as needed for hemorrhoids or anal itching.     isosorbide -hydrALAZINE  (BIDIL ) 20-37.5 MG tablet TAKE 1 TABLET BY MOUTH IN THE MORNING AND AT BEDTIME (Patient taking differently: Place 1 tablet into feeding tube in the morning and at bedtime.) 180 tablet 1   lidocaine -prilocaine  (EMLA ) cream Apply 1 Application topically as needed (as directed). 5 g 0   magic mouthwash (nystatin , lidocaine , diphenhydrAMINE , alum & mag hydroxide) suspension Swish and Swallow 5 mLs by mouth 4 (four) times daily as needed for mouth pain. Suspension contains equal amounts of Maalox Extra Strength, nystatin , diphenhydramine  and lidocaine . 140 mL 1   Nutritional Supplements (NUTREN 1.5) LIQD Nutren 1.5 - give 2 cartons TID (1500 ml/day). Flush tube with 60 ml water  before and after each bolus + additional 200 ml free water  QID. Provides 2250 kcal, 102 g, 1146 ml free water  (2306 ml total water ). Meets >85% of needs.     ondansetron  (ZOFRAN ) 8 MG tablet Take 1 tablet (8 mg total) by mouth every 8 (eight) hours as needed for nausea or vomiting. (Patient taking differently: Place 8 mg into feeding tube every 8 (eight) hours as needed for nausea or vomiting.) 20 tablet 2   ONETOUCH VERIO test strip USE TO CHECK BLOOD SUGAR TWICE DAILY      polyethylene glycol (MIRALAX  / GLYCOLAX ) 17 g packet Take 17 g by mouth daily. (Patient taking differently: Place 17 g into feeding tube daily as needed for mild constipation or moderate constipation.) 14 each 0   prochlorperazine  (COMPAZINE ) 10 MG tablet Take 1 tablet (10 mg total) by mouth every 6 (six) hours as needed for nausea or vomiting. (Patient taking differently: Place 10 mg into feeding tube every 6 (six) hours as needed for nausea or vomiting.) 30 tablet 2   psyllium (HYDROCIL/METAMUCIL) 95 % PACK Take 1 packet by mouth daily. (Patient taking differently: Take 1 packet by mouth daily as needed for mild constipation or moderate constipation.) 240 each 0   Water  For Irrigation, Sterile (FREE WATER ) SOLN Place 100 mLs into feeding tube every 6 (six) hours.     No current facility-administered medications for this visit.    HISTORY OF PRESENT ILLNESS:   Oncology History  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HCC)  06/14/2013 Procedure   Biopsy of the tonsil confirmrf squamous cell carcinoma. 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)  05/27/2023 Cancer Staging   Staging form: Esophagus - Squamous Cell Carcinoma, AJCC 8th Edition - Clinical stage from 05/27/2023: Stage Unknown (cTX, cN1, cM0, GX) - Signed by [name], MD on 06/23/2023 Stage prefix: Initial diagnosis Histologic grading system: 3 grade system   06/08/2023 Initial Diagnosis   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)   06/24/2023 - 07/22/2023 Chemotherapy   Patient is on Treatment Plan : ESOPHAGUS Carboplatin  + Paclitaxel  Weekly X 6 Weeks with XRT     11/30/2023 -  Chemotherapy   Patient is on Treatment Plan : GASTROESOPHAGEAL Nivolumab  (240) q14d x 8 cycles / Nivolumab  (480) q28d     01/21/2024 PET scan   IMPRESSION: 1.  Progressive circumferential wall thickening of the esophagus with associated diffuse hypermetabolic activity concerning for progressive esophageal cancer. 2. No evidence of adjacent hypermetabolic thoracic adenopathy or distant metastatic disease. 3. Mildly prominent activity near the hepatic flexure of the colon without obvious corresponding abnormality on the CT images. This is nonspecific, and could be physiologic. Correlate with colon cancer screening history. 4.  Aortic Atherosclerosis (ICD10-I70.0).         REVIEW OF SYSTEMS:   Constitutional: Denies fevers or chills. Severe decrease in appetite. Weight loss of 12 pounds.  Eyes: Denies blurriness of vision Ears, nose, mouth, throat, and face: Denies mucositis or sore throat Respiratory: Denies cough, dyspnea or wheezes Cardiovascular: Denies palpitation, chest discomfort or lower extremity swelling Gastrointestinal:  Denies nausea, heartburn or change in bowel habits. Difficult with swallowing. Skin: Denies abnormal skin rashes Lymphatics: Denies new lymphadenopathy or easy bruising Neurological:Denies numbness, tingling or new weaknesses Behavioral/Psych: Mood is stable, no new changes  All other systems were reviewed with the patient and are negative.   VITALS:   Today's Vitals   02/15/24 1100  BP: 113/78  Pulse: 90  Resp: 16  Temp: 98.1 F (36.7 C)  TempSrc: Temporal  SpO2: 98%  Weight: 233 lb 8 oz (105.9 kg)  PainSc: 6    Body mass index is 29.98 kg/m.   Wt Readings from Last 3 Encounters:  02/15/24 233 lb 8 oz (105.9 kg)  02/06/24 245 lb 2.8 oz (111.2 kg)  02/05/24 245 lb (111.1 kg)    Body mass index is 29.98 kg/m.  Performance status (ECOG): 2 - Symptomatic, <50% confined to bed  PHYSICAL EXAM:   GENERAL:alert, no distress and comfortable SKIN: skin color, texture, turgor are normal, no rashes or significant lesions EYES: normal, Conjunctiva are pink and non-injected, sclera clear OROPHARYNX:no  exudate, no erythema and lips, buccal mucosa, and tongue normal  NECK: supple, thyroid  normal size, non-tender, without nodularity LYMPH:  no palpable lymphadenopathy in the cervical, axillary or inguinal LUNGS: clear to auscultation and percussion with normal breathing effort HEART: regular rate & rhythm and no murmurs and no lower extremity edema ABDOMEN:abdomen soft, non-tender and normal bowel sounds. Intact feeding tube.  Musculoskeletal:no cyanosis of digits and no clubbing  NEURO: alert & oriented x 3 with fluent speech, no focal motor/sensory deficits  LABORATORY DATA:  I have reviewed the data as listed    Component Value Date/Time   NA 144 02/15/2024 1046  NA 144 01/04/2024 1119   NA 139 08/15/2013 1428   K 3.9 02/15/2024 1046   K 4.0 08/15/2013 1428   CL 106 02/15/2024 1046   CO2 28 02/15/2024 1046   CO2 29 08/15/2013 1428   GLUCOSE 165 (H) 02/15/2024 1046   GLUCOSE 155 (H) 08/15/2013 1428   BUN 23 02/15/2024 1046   BUN 13 01/04/2024 1119   BUN 14.3 08/15/2013 1428   CREATININE 0.83 02/15/2024 1046   CREATININE 1.2 08/15/2013 1428   CALCIUM  9.2 02/15/2024 1046   CALCIUM  9.8 08/15/2013 1428   PROT 7.6 02/15/2024 1046   PROT 7.1 04/27/2023 0829   PROT 7.3 06/30/2013 1438   ALBUMIN 3.7 02/15/2024 1046   ALBUMIN 4.3 04/27/2023 0829   ALBUMIN 3.9 06/30/2013 1438   AST 22 02/15/2024 1046   AST 13 06/30/2013 1438   ALT 24 02/15/2024 1046   ALT 11 06/30/2013 1438   ALKPHOS 149 (H) 02/15/2024 1046   ALKPHOS 147 06/30/2013 1438   BILITOT 0.6 02/15/2024 1046   BILITOT 0.59 06/30/2013 1438   GFRNONAA >60 02/15/2024 1046   GFRAA 104 04/10/2020 0824   Lab Results  Component Value Date   WBC 8.6 02/15/2024   NEUTROABS 6.3 02/15/2024   HGB 11.1 (L) 02/15/2024   HCT 33.5 (L) 02/15/2024   MCV 80.7 02/15/2024   PLT 324 02/15/2024    RADIOGRAPHIC STUDIES: ECHOCARDIOGRAM COMPLETE Result Date: 02/11/2024    ECHOCARDIOGRAM REPORT   Patient Name:   Melvin Hudson Date  of Exam: 02/10/2024 Medical Rec #:  [id number]      Height:       74.0 in Accession #:    [id number]     Weight:       245.2 lb Date of Birth:  1955/10/20       BSA:          2.370 m Patient Age:    68 years       BP:           133/83 mmHg Patient Gender: M              HR:           86 bpm. Exam Location:  [address] Procedure: 2D Echo, 3D Echo, Cardiac Doppler and Color Doppler (Both Spectral            and Color Flow Doppler were utilized during procedure). Indications:    R07.9 Chest Pain  History:        Patient has prior history of Echocardiogram examinations, most                 recent 03/29/2022. Dilated Aorta, Arrythmias:RBBB; Risk                 Factors:Sleep Apnea, Diabetes and Former Smoker.  Sonographer:    Waldo Guadalajara RCS Referring Phys: [id number] DAYNA N DUNN IMPRESSIONS  1. Left ventricular ejection fraction, by estimation, is 50 to 55%. Left ventricular ejection fraction by 3D volume is 53 %. The left ventricle has low normal function. The left ventricle has no regional wall motion abnormalities. Left ventricular diastolic parameters are consistent with Grade I diastolic dysfunction (impaired relaxation).  2. Right ventricular systolic function is normal. The right ventricular size is normal. There is normal pulmonary artery systolic pressure.  3. The mitral valve is normal in structure. Trivial mitral valve regurgitation. No evidence of mitral stenosis.  4. The aortic valve is calcified. Aortic valve regurgitation is not visualized. No  aortic stenosis is present.  5. Aortic dilatation noted. There is borderline dilatation of the ascending aorta, measuring 39 mm.  6. The inferior vena cava is normal in size with greater than 50% respiratory variability, suggesting right atrial pressure of 3 mmHg. FINDINGS  Left Ventricle: Left ventricular ejection fraction, by estimation, is 50 to 55%. Left ventricular ejection fraction by 3D volume is 53 %. The left ventricle has low normal function. The left  ventricle has no regional wall motion abnormalities. The left ventricular internal cavity size was normal in size. There is no left ventricular hypertrophy. Left ventricular diastolic parameters are consistent with Grade I diastolic dysfunction (impaired relaxation). Right Ventricle: The right ventricular size is normal. No increase in right ventricular wall thickness. Right ventricular systolic function is normal. There is normal pulmonary artery systolic pressure. The tricuspid regurgitant velocity is 2.12 m/s, and  with an assumed right atrial pressure of 3 mmHg, the estimated right ventricular systolic pressure is 21.0 mmHg. Left Atrium: Left atrial size was normal in size. Right Atrium: Right atrial size was normal in size. Pericardium: There is no evidence of pericardial effusion. Mitral Valve: The mitral valve is normal in structure. Trivial mitral valve regurgitation. No evidence of mitral valve stenosis. Tricuspid Valve: The tricuspid valve is normal in structure. Tricuspid valve regurgitation is trivial. No evidence of tricuspid stenosis. Aortic Valve: The aortic valve is calcified. Aortic valve regurgitation is not visualized. No aortic stenosis is present. Pulmonic Valve: The pulmonic valve was normal in structure. Pulmonic valve regurgitation is trivial. No evidence of pulmonic stenosis. Aorta: Aortic dilatation noted. There is borderline dilatation of the ascending aorta, measuring 39 mm. Venous: The inferior vena cava is normal in size with greater than 50% respiratory variability, suggesting right atrial pressure of 3 mmHg. IAS/Shunts: No atrial level shunt detected by color flow Doppler. Additional Comments: 3D was performed not requiring image post processing on an independent workstation and was normal.  LEFT VENTRICLE PLAX 2D LVIDd:         4.20 cm         Diastology LVIDs:         2.90 cm         LV e' medial:    5.77 cm/s LV PW:         1.10 cm         LV E/e' medial:  10.5 LV IVS:        1.30  cm         LV e' lateral:   6.42 cm/s LVOT diam:     2.30 cm         LV E/e' lateral: 9.4 LV SV:         76 LV SV Index:   32 LVOT Area:     4.15 cm        3D Volume EF LV IVRT:       134 msec        LV 3D EF:    Left                                             ventricul  ar                                             ejection                                             fraction                                             by 3D                                             volume is                                             53 %.                                 3D Volume EF:                                3D EF:        53 %                                LV EDV:       100 ml                                LV ESV:       47 ml                                LV SV:        54 ml RIGHT VENTRICLE RV Basal diam:  2.80 cm     PULMONARY VEINS RV S prime:     12.00 cm/s  A Reversal Velocity: 47.00 cm/s RVSP:           21.0 mmHg   Diastolic Velocity:  67.50 cm/s                             S/D Velocity:        0.70                             Systolic Velocity:   49.70 cm/s LEFT ATRIUM             Index        RIGHT ATRIUM           Index LA diam:        3.20 cm 1.35 cm/m  RA Pressure: 3.00 mmHg LA Vol (A2C):   33.9 ml 14.30 ml/m  RA Area:     12.40 cm LA Vol (A4C):   27.4 ml 11.56 ml/m  RA Volume:   24.20 ml  10.21 ml/m LA Biplane Vol: 31.6 ml 13.33 ml/m  AORTIC VALVE LVOT Vmax:   113.00 cm/s LVOT Vmean:  72.900 cm/s LVOT VTI:    0.184 m  AORTA Ao Root diam: 3.80 cm Ao Asc diam:  3.90 cm MITRAL VALVE               TRICUSPID VALVE MV Area (PHT):             TR Peak grad:   18.0 mmHg MV Decel Time              TR Vmax:        212.00 cm/s MV E velocity: 60.30 cm/s  Estimated RAP:  3.00 mmHg MV A velocity: 92.80 cm/s  RVSP:           21.0 mmHg MV E/A ratio:  0.65                            SHUNTS                            Systemic VTI:  0.18 m                            Systemic  Diam: 2.30 cm Toribio Fuel MD Electronically signed by Toribio Fuel MD Signature Date/Time: 02/11/2024/9:57:02 PM    Final    DG Chest 1 View Result Date: 02/05/2024 CLINICAL DATA:  Oral bleeding. EXAM: CHEST  1 VIEW COMPARISON:  CT 01/05/2024 FINDINGS: Right chest port with tip in the SVC. Upper normal heart size. Aortic atherosclerosis. Prominence of the right hilum due to vascular structures on CT. No confluent opacity, large pleural effusion or pneumothorax. No pulmonary edema. On limited assessment, no acute osseous findings. IMPRESSION: 1. No acute chest findings. 2. Right chest port with tip in the SVC. Electronically Signed   By: Andrea Gasman M.D.   On: 02/05/2024 17:53   DG ESOPHAGUS W SINGLE CM (SOL OR THIN BA) Result Date: 02/04/2024 CLINICAL DATA:  Upper esophageal dysphagia. History of esophageal cancer with prior radiation therapy. EXAM: ESOPHOGRAM/BARIUM SWALLOW TECHNIQUE: Single contrast examination was performed using  thin barium. FLUOROSCOPY: Radiation Exposure Index (as provided by the fluoroscopic device): 2.5 mGy Kerma COMPARISON:  PET of 01/21/2024. FINDINGS: Initial evaluation of the hypopharynx demonstrates a delayed swallow trigger. Prominent cricopharyngeus. With swallows, there is small volume aspiration including on image 189 of series 1. The patient had retained contrast within the oropharynx and was unable to complete the bolus. At this point, decision was made to forego the remainder of the exam until speech pathology evaluation had been performed. IMPRESSION: Initial evaluation of the hypopharynx demonstrating aspiration. Therefore, exam was halted. Recommend further evaluation with speech pathology. These results will be called to the ordering clinician or representative by the Radiologist Assistant, and communication documented in the PACS or [employer]. Electronically Signed   By: Rockey Kilts M.D.   On: 02/04/2024 12:32   "

## 2024-02-15 ENCOUNTER — Other Ambulatory Visit: Payer: Self-pay

## 2024-02-15 ENCOUNTER — Inpatient Hospital Stay: Admitting: Nurse Practitioner

## 2024-02-15 ENCOUNTER — Other Ambulatory Visit (HOSPITAL_COMMUNITY): Payer: Self-pay

## 2024-02-15 ENCOUNTER — Inpatient Hospital Stay

## 2024-02-15 VITALS — BP 113/78 | HR 90 | Temp 98.1°F | Resp 16 | Wt 233.5 lb

## 2024-02-15 DIAGNOSIS — C153 Malignant neoplasm of upper third of esophagus: Secondary | ICD-10-CM | POA: Diagnosis not present

## 2024-02-15 DIAGNOSIS — Z5112 Encounter for antineoplastic immunotherapy: Secondary | ICD-10-CM | POA: Diagnosis not present

## 2024-02-15 LAB — CBC WITH DIFFERENTIAL (CANCER CENTER ONLY)
Abs Immature Granulocytes: 0.08 K/uL — ABNORMAL HIGH (ref 0.00–0.07)
Basophils Absolute: 0 K/uL (ref 0.0–0.1)
Basophils Relative: 1 %
Eosinophils Absolute: 0.4 K/uL (ref 0.0–0.5)
Eosinophils Relative: 5 %
HCT: 33.5 % — ABNORMAL LOW (ref 39.0–52.0)
Hemoglobin: 11.1 g/dL — ABNORMAL LOW (ref 13.0–17.0)
Immature Granulocytes: 1 %
Lymphocytes Relative: 12 %
Lymphs Abs: 1 K/uL (ref 0.7–4.0)
MCH: 26.7 pg (ref 26.0–34.0)
MCHC: 33.1 g/dL (ref 30.0–36.0)
MCV: 80.7 fL (ref 80.0–100.0)
Monocytes Absolute: 0.8 K/uL (ref 0.1–1.0)
Monocytes Relative: 9 %
Neutro Abs: 6.3 K/uL (ref 1.7–7.7)
Neutrophils Relative %: 72 %
Platelet Count: 324 K/uL (ref 150–400)
RBC: 4.15 MIL/uL — ABNORMAL LOW (ref 4.22–5.81)
RDW: 19.8 % — ABNORMAL HIGH (ref 11.5–15.5)
WBC Count: 8.6 K/uL (ref 4.0–10.5)
nRBC: 0.7 % — ABNORMAL HIGH (ref 0.0–0.2)

## 2024-02-15 LAB — CMP (CANCER CENTER ONLY)
ALT: 24 U/L (ref 0–44)
AST: 22 U/L (ref 15–41)
Albumin: 3.7 g/dL (ref 3.5–5.0)
Alkaline Phosphatase: 149 U/L — ABNORMAL HIGH (ref 38–126)
Anion gap: 10 (ref 5–15)
BUN: 23 mg/dL (ref 8–23)
CO2: 28 mmol/L (ref 22–32)
Calcium: 9.2 mg/dL (ref 8.9–10.3)
Chloride: 106 mmol/L (ref 98–111)
Creatinine: 0.83 mg/dL (ref 0.61–1.24)
GFR, Estimated: >60 mL/min
Glucose, Bld: 165 mg/dL — ABNORMAL HIGH (ref 70–99)
Potassium: 3.9 mmol/L (ref 3.5–5.1)
Sodium: 144 mmol/L (ref 135–145)
Total Bilirubin: 0.6 mg/dL (ref 0.0–1.2)
Total Protein: 7.6 g/dL (ref 6.5–8.1)

## 2024-02-15 LAB — IRON AND IRON BINDING CAPACITY (CC-WL,HP ONLY)
Iron: 29 ug/dL — ABNORMAL LOW (ref 45–182)
Saturation Ratios: 12 % — ABNORMAL LOW (ref 17.9–39.5)
TIBC: 242 ug/dL — ABNORMAL LOW (ref 250–450)
UIBC: 213 ug/dL

## 2024-02-15 LAB — FERRITIN: Ferritin: 387 ng/mL — ABNORMAL HIGH (ref 24–336)

## 2024-02-15 MED ORDER — NYSTATIN 100000 UNIT/ML MT SUSP
5.0000 mL | Freq: Four times a day (QID) | OROMUCOSAL | 1 refills | Status: AC | PRN
  Filled 2024-02-15: qty 140, 7d supply, fill #0
  Filled 2024-02-23: qty 140, 7d supply, fill #1

## 2024-02-15 MED ORDER — HYDROCODONE-ACETAMINOPHEN 7.5-325 MG/15ML PO SOLN
10.0000 mL | Freq: Four times a day (QID) | ORAL | 0 refills | Status: DC | PRN
  Filled 2024-02-15: qty 473, 8d supply, fill #0

## 2024-02-15 MED ORDER — PROSOURCE TF20 ENFIT COMPATIBL EN LIQD
60.0000 mL | Freq: Every day | ENTERAL | 1 refills | Status: AC
  Filled 2024-02-15: qty 1800, 30d supply, fill #0

## 2024-02-17 ENCOUNTER — Other Ambulatory Visit: Payer: Self-pay

## 2024-02-21 ENCOUNTER — Encounter: Payer: Self-pay | Admitting: Nurse Practitioner

## 2024-02-21 ENCOUNTER — Encounter: Payer: Self-pay | Admitting: Hematology

## 2024-02-22 ENCOUNTER — Other Ambulatory Visit: Payer: Self-pay | Admitting: Nurse Practitioner

## 2024-02-22 DIAGNOSIS — C153 Malignant neoplasm of upper third of esophagus: Secondary | ICD-10-CM

## 2024-02-22 NOTE — Progress Notes (Signed)
 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 -EGD on 05/27/2023 which showed a mass in the proximal esophagus, 2 to 3 cm below introitus.  Biopsy confirmed invasive squamous cell carcinoma.  -PET scan showed hypermetabolic lesion in the proximal esophagus, with a positive peritracheal lymph node.  No distant metastasis -Status post PEG feeding tube placement  -He started chemoRT with weekly carbo and Taxol  on 06/24/2023 and completed on 08/03/23 -ED visit for chest pain on 06/29/2023 -repeated upper EGD and biopsy was negative for residual tumor on 10/30/2023 - Given the significant residual hypermetabolic activity on the PET scan and his high risk for recurrence, Nivolumab  was recommended for 1 year. He started on 11/30/2023 -PET on 01/21/2024 showed disease progression at primary site, no distant mets. Plan to change chemo to FOLFOX and Tislelizumab Bettyjane) - Will proceed with treatment, nivolumab , as currently scheduled on 02/23/2024. -Will arrange follow-up with Dr.Feng the following week to discuss potential treatment change due to disease progression.    Esophageal cancer  New progression noted on PET scan 01/21/2024. Plans for treatment change to FOLFOX and Tevimbra. Plan start delayed due to recent hospitalization. Will proceed with nivolumab  as originally scheduled. Will follow up with Dr. Lanny  03/01/2024 for further evaluation and discussion of He is having worsening dysphagia. Disappointed about delay in treatment delay. Is worried that delay will promote further disease progression.   Cough Patient complaining of cough and difference in his breathing. His O2 is at 98% on room air. He does have mildly congested cough. There was mild wheezing heard throughout the lung fields. His WBC and ANC are within normal limits. Will treat with doxycycline  100 mg twice daily.   Anemia Mild and stable anemia with Hgb 10.8 and Hct 32.6. iron panel slightly low. Awaiting results of ferritin. Levels are appropriate for treatment today.   Cancer related pain  Well managed with current pain medication. This is prescribed as liquid for delivery through feeding tube. A refill was sent to [facility] .  Plan  Labs reviewed.  -mild anemia.  -mildly low iron panel -CMP unremarkable.  Proceed with treatment Nivolumab  as scheduled.  Will meet with Dr. Lanny next week to discuss treatment change to FOLFOX and Tevimbra.  -He understands that estimated start date for new treatment is likely to be 03/21/2024.   The patient understands the plans discussed today and is in agreement with them.  He knows to contact our office if he develops concerns prior to his next appointment.  I provided 25 minutes of face-to-face time during this encounter and > 50% was spent counseling as documented under my assessment and plan.    [name], NP  [facility] - A DEPT OF [facility] [address] [employer] [place] [address] Dept: [phone] Dept Fax: [phone]   No orders of the defined types were placed in this encounter.     CHIEF COMPLAINT:  CC: Squamous cell esophageal cancer  Current Treatment: Nivolumab  every 4 weeks pending chemotherapy changed to FOLFOX plus Tevimbra  INTERVAL HISTORY:  Melvin Hudson is here today for repeat  clinical assessment.  He last saw me on 02/15/2024 for hospital follow-up.he presents for treatment with immunotherapy Nivolumab  today. Will meet with Dr. Lanny next week to discuss treatment change due to disease recurrence. He continues to have trouble swallowing.  He also has a cough and feels like his breathing is different. He denies chest pain, chest pressure, or shortness of breath. He denies headaches or visual disturbances. He denies abdominal pain, nausea, vomiting, or changes in bowel or bladder habits.  He is using feeding tube as primary source of nutrition.  He denies fevers or chills. He denies pain. His appetite is poor. His weight has decreased 4 pounds over last week. He will meet with nutritionist during infusion today.   I have reviewed the past medical history, past surgical history, social history and family history with the patient and they are unchanged from previous note.  ALLERGIES:  is allergic to azilsartan and advil [ibuprofen].  MEDICATIONS:  Current Outpatient Medications  Medication Sig Dispense Refill   albuterol  (VENTOLIN  HFA) 108 (90 Base) MCG/ACT inhaler Inhale 2 puffs into the lungs every 4 (four) hours as needed for wheezing (or coughing).     allopurinol  (ZYLOPRIM ) 300 MG tablet Place 300 mg into feeding tube daily as needed (for gout flares).     atorvastatin  (LIPITOR) 40 MG tablet Take 1 tablet (40 mg total) by mouth at bedtime. (Patient taking differently: Place 40 mg into feeding tube at bedtime.) 90 tablet 1   bismuth subsalicylate (PEPTO BISMOL) 262 MG/15ML suspension Place 30 mLs into feeding tube every 6 (six) hours as needed for indigestion or diarrhea or loose stools.     Blood Glucose Monitoring Suppl (ONETOUCH VERIO FLEX SYSTEM) w/Device KIT USE TO CHECK BLOOD SUGAR TWICE DAILY     calcium  carbonate (TUMS - DOSED IN MG ELEMENTAL CALCIUM ) 500 MG chewable tablet Chew 2 tablets (400 mg of elemental calcium  total) by mouth 3 (three) times daily with meals.  (Patient taking differently: Place 400 mg of elemental calcium  into feeding tube daily as needed for indigestion or heartburn.) 180 tablet 1   carvedilol  (COREG ) 3.125 MG tablet Place 3.125 mg into feeding tube 2 (two) times daily with a meal.     cetirizine (ZYRTEC) 10 MG tablet Place 10 mg into feeding tube daily as needed for allergies or rhinitis.     clonazePAM  (KLONOPIN ) 1 MG disintegrating tablet Place 1 mg into feeding tube daily as needed (for anxiety).     dapagliflozin  propanediol (FARXIGA ) 10 MG TABS tablet 10 mg See admin instructions. 10 mg, per tube, once a day     diphenhydrAMINE  (BENADRYL ) 25 MG tablet Take 1 tablet (25 mg total) by mouth every 6 (six) hours. (Patient taking differently: Take 25 mg by mouth every 6 (six) hours as needed for itching.) 20 tablet 0   doxycycline  (VIBRA -TABS) 100 MG tablet Take 1 tablet (100 mg total) by mouth 2 (two) times daily. 14 tablet 0   famotidine  (PEPCID ) 40 MG/5ML suspension PLACE 2.5 MILLILITERS INTO THE FEEDING TUBE DAILY (Patient taking differently: Place 20 mg into feeding tube daily before breakfast.) 50 mL 1   gabapentin  (NEURONTIN ) 300 MG capsule Place 300 mg into feeding tube 2 (two) times daily. Feeding tube  2   hydrocortisone  (ANUSOL -HC) 2.5 % rectal cream Place 1 Application rectally daily as  needed for hemorrhoids or anal itching.     isosorbide -hydrALAZINE  (BIDIL ) 20-37.5 MG tablet TAKE 1 TABLET BY MOUTH IN THE MORNING AND AT BEDTIME (Patient taking differently: Place 1 tablet into feeding tube in the morning and at bedtime.) 180 tablet 1   lidocaine -prilocaine  (EMLA ) cream Apply 1 Application topically as needed (as directed). 5 g 0   magic mouthwash (nystatin , lidocaine , diphenhydrAMINE , alum & mag hydroxide) suspension Swish and Swallow 5 mLs by mouth 4 (four) times daily as needed for mouth pain. Suspension contains equal amounts of Maalox Extra Strength, nystatin , diphenhydramine  and lidocaine . 140 mL 1   Nutritional Supplements  (NUTREN 1.5) LIQD Nutren 1.5 - give 2 cartons TID (1500 ml/day). Flush tube with 60 ml water  before and after each bolus + additional 200 ml free water  QID. Provides 2250 kcal, 102 g, 1146 ml free water  (2306 ml total water ). Meets >85% of needs.     ondansetron  (ZOFRAN ) 8 MG tablet Take 1 tablet (8 mg total) by mouth every 8 (eight) hours as needed for nausea or vomiting. (Patient taking differently: Place 8 mg into feeding tube every 8 (eight) hours as needed for nausea or vomiting.) 20 tablet 2   ONETOUCH VERIO test strip USE TO CHECK BLOOD SUGAR TWICE DAILY     polyethylene glycol (MIRALAX  / GLYCOLAX ) 17 g packet Take 17 g by mouth daily. (Patient taking differently: Place 17 g into feeding tube daily as needed for mild constipation or moderate constipation.) 14 each 0   prochlorperazine  (COMPAZINE ) 10 MG tablet Take 1 tablet (10 mg total) by mouth every 6 (six) hours as needed for nausea or vomiting. (Patient taking differently: Place 10 mg into feeding tube every 6 (six) hours as needed for nausea or vomiting.) 30 tablet 2   Protein (FEEDING SUPPLEMENT, PROSOURCE TF20,) liquid Place 60 mLs into feeding tube daily. 1800 mL 1   psyllium (HYDROCIL/METAMUCIL) 95 % PACK Take 1 packet by mouth daily. (Patient taking differently: Take 1 packet by mouth daily as needed for mild constipation or moderate constipation.) 240 each 0   Water  For Irrigation, Sterile (FREE WATER ) SOLN Place 100 mLs into feeding tube every 6 (six) hours.     apixaban  (ELIQUIS ) 5 MG TABS tablet Take 1 tablet (5 mg total) by mouth 2 (two) times daily. 60 tablet 1   HYDROcodone -acetaminophen  (HYCET) 7.5-325 mg/15 ml solution Take 10-15 mLs by mouth every 6 (six) hours as needed for moderate pain (pain score 4-6). 473 mL 0   No current facility-administered medications for this visit.   Facility-Administered Medications Ordered in Other Visits  Medication Dose Route Frequency Provider Last Rate Last Admin   0.9 %  sodium chloride   infusion   Intravenous Continuous [name], MD 10 mL/hr at 02/23/24 1103 New Bag at 02/23/24 1103   nivolumab  (OPDIVO ) 480 mg in sodium chloride  0.9 % 100 mL chemo infusion  480 mg Intravenous Once [name], MD 296 mL/hr at 02/23/24 1111 480 mg at 02/23/24 1111    HISTORY OF PRESENT ILLNESS:   Oncology History  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HCC)  06/14/2013 Procedure   Biopsy of the tonsil confirmrf squamous cell carcinoma. 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 Progressive circumferential wall thickening of the esophagus with associated diffuse hypermetabolic activity concerning for progressive esophageal cancer. 2. No evidence of adjacent hypermetabolic thoracic adenopathy or distant metastatic disease. 3. Mildly prominent activity near the hepatic flexure of the colon without obvious corresponding abnormality on the CT images. This is nonspecific, and could be physiologic. Correlate with colon cancer screening history. 4.  Aortic Atherosclerosis (ICD10-I70.0).         REVIEW OF SYSTEMS:   Constitutional: Denies fevers or  chills. He has little appetite for regular food. His nutrition is coming from feeding tube. He has had additional 4 pound weight loss since his last visit . Eyes: Denies blurriness of vision Ears, nose, mouth, throat, and face: Denies mucositis or sore throat Respiratory: has cough and feels like his breathing is different.  Cardiovascular: Denies palpitation, chest discomfort or lower extremity swelling Gastrointestinal:  Denies nausea, heartburn or change in bowel habits Skin: Denies abnormal skin rashes Lymphatics: Denies new lymphadenopathy or easy bruising Neurological:Denies numbness, tingling or new weaknesses Behavioral/Psych: Mood is stable, no new changes  All other systems were reviewed with the patient and are negative.   VITALS:   Today's Vitals   02/23/24 0955 02/23/24 0957  BP: 109/79   Pulse: 83   Resp: 17   Temp: 98.3 F (36.8 C)   SpO2: 98%   Weight: 229 lb 6.4 oz (104.1 kg)   PainSc:  6    Body mass index is 29.45 kg/m.   Wt Readings from Last 3 Encounters:  02/23/24 229 lb 6.4 oz (104.1 kg)  02/15/24 233 lb 8 oz (105.9 kg)  02/06/24 245 lb 2.8 oz (111.2 kg)    Body mass index is 29.45 kg/m.  Performance status (ECOG): 2 - Symptomatic, <50% confined to bed  PHYSICAL EXAM:   GENERAL:alert, no distress and comfortable SKIN: skin color, texture, turgor are normal, no rashes or significant lesions EYES: normal, Conjunctiva are pink and non-injected, sclera clear OROPHARYNX:no exudate, no erythema and lips, buccal mucosa, and tongue normal  NECK: supple, thyroid  normal size, non-tender, without nodularity LYMPH:  no palpable lymphadenopathy in the cervical, axillary or inguinal LUNGS: there is mild wheezing heard throughout the lung fields. He has congested, non-productive cough noted . HEART: regular rate & rhythm and no murmurs and no lower extremity edema ABDOMEN:abdomen soft, non-tender and normal bowel sounds Musculoskeletal:no cyanosis of digits  and no clubbing  NEURO: alert & oriented x 3 with fluent speech, no focal motor/sensory deficits  LABORATORY DATA:  I have reviewed the data as listed    Component Value Date/Time   NA 143 02/23/2024 0930   NA 144 01/04/2024 1119   NA 139 08/15/2013 1428   K 4.0 02/23/2024 0930   K 4.0 08/15/2013 1428   CL 105 02/23/2024 0930   CO2 31 02/23/2024 0930   CO2 29 08/15/2013  1428   GLUCOSE 136 (H) 02/23/2024 0930   GLUCOSE 155 (H) 08/15/2013 1428   BUN 20 02/23/2024 0930   BUN 13 01/04/2024 1119   BUN 14.3 08/15/2013 1428   CREATININE 0.74 02/23/2024 0930   CREATININE 1.2 08/15/2013 1428   CALCIUM  9.3 02/23/2024 0930   CALCIUM  9.8 08/15/2013 1428   PROT 7.9 02/23/2024 0930   PROT 7.1 04/27/2023 0829   PROT 7.3 06/30/2013 1438   ALBUMIN 3.9 02/23/2024 0930   ALBUMIN 4.3 04/27/2023 0829   ALBUMIN 3.9 06/30/2013 1438   AST 22 02/23/2024 0930   AST 13 06/30/2013 1438   ALT 25 02/23/2024 0930   ALT 11 06/30/2013 1438   ALKPHOS 144 (H) 02/23/2024 0930   ALKPHOS 147 06/30/2013 1438   BILITOT 0.6 02/23/2024 0930   BILITOT 0.59 06/30/2013 1438   GFRNONAA >60 02/23/2024 0930   GFRAA 104 04/10/2020 0824   Lab Results  Component Value Date   WBC 8.2 02/23/2024   NEUTROABS 6.2 02/23/2024   HGB 10.8 (L) 02/23/2024   HCT 32.6 (L) 02/23/2024   MCV 80.9 02/23/2024   PLT 331 02/23/2024    RADIOGRAPHIC STUDIES: ECHOCARDIOGRAM COMPLETE Result Date: 02/11/2024    ECHOCARDIOGRAM REPORT   Patient Name:   Melvin Hudson Date of Exam: 02/10/2024 Medical Rec #:  [id number]      Height:       74.0 in Accession #:    [id number]     Weight:       245.2 lb Date of Birth:  1956/01/10       BSA:          2.370 m Patient Age:    68 years       BP:           133/83 mmHg Patient Gender: M              HR:           86 bpm. Exam Location:  [address] Procedure: 2D Echo, 3D Echo, Cardiac Doppler and Color Doppler (Both Spectral            and Color Flow Doppler were utilized during procedure).  Indications:    R07.9 Chest Pain  History:        Patient has prior history of Echocardiogram examinations, most                 recent 03/29/2022. Dilated Aorta, Arrythmias:RBBB; Risk                 Factors:Sleep Apnea, Diabetes and Former Smoker.  Sonographer:    Waldo Guadalajara RCS Referring Phys: [id number] DAYNA N DUNN IMPRESSIONS  1. Left ventricular ejection fraction, by estimation, is 50 to 55%. Left ventricular ejection fraction by 3D volume is 53 %. The left ventricle has low normal function. The left ventricle has no regional wall motion abnormalities. Left ventricular diastolic parameters are consistent with Grade I diastolic dysfunction (impaired relaxation).  2. Right ventricular systolic function is normal. The right ventricular size is normal. There is normal pulmonary artery systolic pressure.  3. The mitral valve is normal in structure. Trivial mitral valve regurgitation. No evidence of mitral stenosis.  4. The aortic valve is calcified. Aortic valve regurgitation is not visualized. No aortic stenosis is present.  5. Aortic dilatation noted. There is borderline dilatation of the ascending aorta, measuring 39 mm.  6. The inferior vena cava is normal in size with greater than 50% respiratory variability, suggesting right atrial  pressure of 3 mmHg. FINDINGS  Left Ventricle: Left ventricular ejection fraction, by estimation, is 50 to 55%. Left ventricular ejection fraction by 3D volume is 53 %. The left ventricle has low normal function. The left ventricle has no regional wall motion abnormalities. The left ventricular internal cavity size was normal in size. There is no left ventricular hypertrophy. Left ventricular diastolic parameters are consistent with Grade I diastolic dysfunction (impaired relaxation). Right Ventricle: The right ventricular size is normal. No increase in right ventricular wall thickness. Right ventricular systolic function is normal. There is normal pulmonary artery systolic pressure.  The tricuspid regurgitant velocity is 2.12 m/s, and  with an assumed right atrial pressure of 3 mmHg, the estimated right ventricular systolic pressure is 21.0 mmHg. Left Atrium: Left atrial size was normal in size. Right Atrium: Right atrial size was normal in size. Pericardium: There is no evidence of pericardial effusion. Mitral Valve: The mitral valve is normal in structure. Trivial mitral valve regurgitation. No evidence of mitral valve stenosis. Tricuspid Valve: The tricuspid valve is normal in structure. Tricuspid valve regurgitation is trivial. No evidence of tricuspid stenosis. Aortic Valve: The aortic valve is calcified. Aortic valve regurgitation is not visualized. No aortic stenosis is present. Pulmonic Valve: The pulmonic valve was normal in structure. Pulmonic valve regurgitation is trivial. No evidence of pulmonic stenosis. Aorta: Aortic dilatation noted. There is borderline dilatation of the ascending aorta, measuring 39 mm. Venous: The inferior vena cava is normal in size with greater than 50% respiratory variability, suggesting right atrial pressure of 3 mmHg. IAS/Shunts: No atrial level shunt detected by color flow Doppler. Additional Comments: 3D was performed not requiring image post processing on an independent workstation and was normal.  LEFT VENTRICLE PLAX 2D LVIDd:         4.20 cm         Diastology LVIDs:         2.90 cm         LV e' medial:    5.77 cm/s LV PW:         1.10 cm         LV E/e' medial:  10.5 LV IVS:        1.30 cm         LV e' lateral:   6.42 cm/s LVOT diam:     2.30 cm         LV E/e' lateral: 9.4 LV SV:         76 LV SV Index:   32 LVOT Area:     4.15 cm        3D Volume EF LV IVRT:       134 msec        LV 3D EF:    Left                                             ventricul                                             ar  ejection                                             fraction                                              by 3D                                             volume is                                             53 %.                                 3D Volume EF:                                3D EF:        53 %                                LV EDV:       100 ml                                LV ESV:       47 ml                                LV SV:        54 ml RIGHT VENTRICLE RV Basal diam:  2.80 cm     PULMONARY VEINS RV S prime:     12.00 cm/s  A Reversal Velocity: 47.00 cm/s RVSP:           21.0 mmHg   Diastolic Velocity:  67.50 cm/s                             S/D Velocity:        0.70                             Systolic Velocity:   49.70 cm/s LEFT ATRIUM             Index        RIGHT ATRIUM           Index LA diam:        3.20 cm 1.35 cm/m   RA Pressure: 3.00 mmHg LA Vol (A2C):   33.9 ml 14.30 ml/m  RA Area:     12.40 cm LA Vol (A4C):   27.4 ml 11.56 ml/m  RA Volume:   24.20 ml  10.21 ml/m LA Biplane  Vol: 31.6 ml 13.33 ml/m  AORTIC VALVE LVOT Vmax:   113.00 cm/s LVOT Vmean:  72.900 cm/s LVOT VTI:    0.184 m  AORTA Ao Root diam: 3.80 cm Ao Asc diam:  3.90 cm MITRAL VALVE               TRICUSPID VALVE MV Area (PHT):             TR Peak grad:   18.0 mmHg MV Decel Time              TR Vmax:        212.00 cm/s MV E velocity: 60.30 cm/s  Estimated RAP:  3.00 mmHg MV A velocity: 92.80 cm/s  RVSP:           21.0 mmHg MV E/A ratio:  0.65                            SHUNTS                            Systemic VTI:  0.18 m                            Systemic Diam: 2.30 cm Toribio Fuel MD Electronically signed by Toribio Fuel MD Signature Date/Time: 02/11/2024/9:57:02 PM    Final    DG Chest 1 View Result Date: 02/05/2024 CLINICAL DATA:  Oral bleeding. EXAM: CHEST  1 VIEW COMPARISON:  CT 01/05/2024 FINDINGS: Right chest port with tip in the SVC. Upper normal heart size. Aortic atherosclerosis. Prominence of the right hilum due to vascular structures on CT. No confluent opacity, large pleural effusion or  pneumothorax. No pulmonary edema. On limited assessment, no acute osseous findings. IMPRESSION: 1. No acute chest findings. 2. Right chest port with tip in the SVC. Electronically Signed   By: Andrea Gasman M.D.   On: 02/05/2024 17:53   DG ESOPHAGUS W SINGLE CM (SOL OR THIN BA) Result Date: 02/04/2024 CLINICAL DATA:  Upper esophageal dysphagia. History of esophageal cancer with prior radiation therapy. EXAM: ESOPHOGRAM/BARIUM SWALLOW TECHNIQUE: Single contrast examination was performed using  thin barium. FLUOROSCOPY: Radiation Exposure Index (as provided by the fluoroscopic device): 2.5 mGy Kerma COMPARISON:  PET of 01/21/2024. FINDINGS: Initial evaluation of the hypopharynx demonstrates a delayed swallow trigger. Prominent cricopharyngeus. With swallows, there is small volume aspiration including on image 189 of series 1. The patient had retained contrast within the oropharynx and was unable to complete the bolus. At this point, decision was made to forego the remainder of the exam until speech pathology evaluation had been performed. IMPRESSION: Initial evaluation of the hypopharynx demonstrating aspiration. Therefore, exam was halted. Recommend further evaluation with speech pathology. These results will be called to the ordering clinician or representative by the Radiologist Assistant, and communication documented in the PACS or [employer]. Electronically Signed   By: Rockey Kilts M.D.   On: 02/04/2024 12:32   "

## 2024-02-22 NOTE — Assessment & Plan Note (Addendum)
-  cTxN1M0, invasive squamous cell carcinoma -Present with progressive dysphagia since December 2024. -EGD on 05/27/2023 which showed a mass in the proximal esophagus, 2 to 3 cm below introitus.  Biopsy confirmed invasive squamous cell carcinoma.  -PET scan showed hypermetabolic lesion in the proximal esophagus, with a positive peritracheal lymph node.  No distant metastasis -Status post PEG feeding tube placement  -He started chemoRT with weekly carbo and Taxol  on 06/24/2023 and completed on 08/03/23 -ED visit for chest pain on 06/29/2023 -repeated upper EGD and biopsy was negative for residual tumor on 10/30/2023 - Given the significant residual hypermetabolic activity on the PET scan and his high risk for recurrence, Nivolumab  was recommended for 1 year. He started on 11/30/2023 -PET on 01/21/2024 showed disease progression at primary site, no distant mets. Plan to change chemo to FOLFOX and Tislelizumab Bettyjane) - Will proceed with treatment, nivolumab , as currently scheduled on 02/23/2024. -Will arrange follow-up with Dr.Feng the following week to discuss potential treatment change due to disease progression.

## 2024-02-23 ENCOUNTER — Encounter: Payer: Self-pay | Admitting: Hematology

## 2024-02-23 ENCOUNTER — Other Ambulatory Visit (HOSPITAL_COMMUNITY): Payer: Self-pay

## 2024-02-23 ENCOUNTER — Inpatient Hospital Stay: Admitting: Nurse Practitioner

## 2024-02-23 ENCOUNTER — Encounter: Payer: Self-pay | Admitting: Nurse Practitioner

## 2024-02-23 ENCOUNTER — Inpatient Hospital Stay

## 2024-02-23 ENCOUNTER — Inpatient Hospital Stay: Attending: Hematology

## 2024-02-23 ENCOUNTER — Inpatient Hospital Stay: Admitting: Dietician

## 2024-02-23 VITALS — BP 109/79 | HR 83 | Temp 98.3°F | Resp 17 | Wt 229.4 lb

## 2024-02-23 DIAGNOSIS — Z86718 Personal history of other venous thrombosis and embolism: Secondary | ICD-10-CM | POA: Insufficient documentation

## 2024-02-23 DIAGNOSIS — Z79899 Other long term (current) drug therapy: Secondary | ICD-10-CM | POA: Diagnosis not present

## 2024-02-23 DIAGNOSIS — Z7901 Long term (current) use of anticoagulants: Secondary | ICD-10-CM | POA: Diagnosis not present

## 2024-02-23 DIAGNOSIS — Z931 Gastrostomy status: Secondary | ICD-10-CM | POA: Insufficient documentation

## 2024-02-23 DIAGNOSIS — C153 Malignant neoplasm of upper third of esophagus: Secondary | ICD-10-CM | POA: Diagnosis not present

## 2024-02-23 DIAGNOSIS — Z9221 Personal history of antineoplastic chemotherapy: Secondary | ICD-10-CM | POA: Diagnosis not present

## 2024-02-23 DIAGNOSIS — Z5112 Encounter for antineoplastic immunotherapy: Secondary | ICD-10-CM | POA: Insufficient documentation

## 2024-02-23 DIAGNOSIS — Z923 Personal history of irradiation: Secondary | ICD-10-CM | POA: Insufficient documentation

## 2024-02-23 LAB — CBC WITH DIFFERENTIAL (CANCER CENTER ONLY)
Abs Immature Granulocytes: 0.06 K/uL (ref 0.00–0.07)
Basophils Absolute: 0 K/uL (ref 0.0–0.1)
Basophils Relative: 0 %
Eosinophils Absolute: 0.3 K/uL (ref 0.0–0.5)
Eosinophils Relative: 3 %
HCT: 32.6 % — ABNORMAL LOW (ref 39.0–52.0)
Hemoglobin: 10.8 g/dL — ABNORMAL LOW (ref 13.0–17.0)
Immature Granulocytes: 1 %
Lymphocytes Relative: 11 %
Lymphs Abs: 0.9 K/uL (ref 0.7–4.0)
MCH: 26.8 pg (ref 26.0–34.0)
MCHC: 33.1 g/dL (ref 30.0–36.0)
MCV: 80.9 fL (ref 80.0–100.0)
Monocytes Absolute: 0.7 K/uL (ref 0.1–1.0)
Monocytes Relative: 9 %
Neutro Abs: 6.2 K/uL (ref 1.7–7.7)
Neutrophils Relative %: 76 %
Platelet Count: 331 K/uL (ref 150–400)
RBC: 4.03 MIL/uL — ABNORMAL LOW (ref 4.22–5.81)
RDW: 19.9 % — ABNORMAL HIGH (ref 11.5–15.5)
WBC Count: 8.2 K/uL (ref 4.0–10.5)
nRBC: 0.5 % — ABNORMAL HIGH (ref 0.0–0.2)

## 2024-02-23 LAB — CMP (CANCER CENTER ONLY)
ALT: 25 U/L (ref 0–44)
AST: 22 U/L (ref 15–41)
Albumin: 3.9 g/dL (ref 3.5–5.0)
Alkaline Phosphatase: 144 U/L — ABNORMAL HIGH (ref 38–126)
Anion gap: 6 (ref 5–15)
BUN: 20 mg/dL (ref 8–23)
CO2: 31 mmol/L (ref 22–32)
Calcium: 9.3 mg/dL (ref 8.9–10.3)
Chloride: 105 mmol/L (ref 98–111)
Creatinine: 0.74 mg/dL (ref 0.61–1.24)
GFR, Estimated: >60 mL/min
Glucose, Bld: 136 mg/dL — ABNORMAL HIGH (ref 70–99)
Potassium: 4 mmol/L (ref 3.5–5.1)
Sodium: 143 mmol/L (ref 135–145)
Total Bilirubin: 0.6 mg/dL (ref 0.0–1.2)
Total Protein: 7.9 g/dL (ref 6.5–8.1)

## 2024-02-23 LAB — IRON AND IRON BINDING CAPACITY (CC-WL,HP ONLY)
Iron: 36 ug/dL — ABNORMAL LOW (ref 45–182)
Saturation Ratios: 15 % — ABNORMAL LOW (ref 17.9–39.5)
TIBC: 239 ug/dL — ABNORMAL LOW (ref 250–450)
UIBC: 204 ug/dL

## 2024-02-23 LAB — FERRITIN: Ferritin: 349 ng/mL — ABNORMAL HIGH (ref 24–336)

## 2024-02-23 MED ORDER — DOXYCYCLINE HYCLATE 100 MG PO TABS
100.0000 mg | ORAL_TABLET | Freq: Two times a day (BID) | ORAL | 0 refills | Status: AC
  Filled 2024-02-23: qty 14, 7d supply, fill #0

## 2024-02-23 MED ORDER — APIXABAN 5 MG PO TABS
5.0000 mg | ORAL_TABLET | Freq: Two times a day (BID) | ORAL | 1 refills | Status: AC
  Filled 2024-02-23: qty 60, 30d supply, fill #0

## 2024-02-23 MED ORDER — HYDROCODONE-ACETAMINOPHEN 7.5-325 MG/15ML PO SOLN
10.0000 mL | Freq: Four times a day (QID) | ORAL | 0 refills | Status: DC | PRN
  Filled 2024-02-23: qty 473, 8d supply, fill #0

## 2024-02-23 MED ORDER — SODIUM CHLORIDE 0.9 % IV SOLN: INTRAVENOUS | Status: DC

## 2024-02-23 MED ORDER — SODIUM CHLORIDE 0.9 % IV SOLN
480.0000 mg | Freq: Once | INTRAVENOUS | Status: AC
  Administered 2024-02-23: 480 mg via INTRAVENOUS
  Filled 2024-02-23: qty 48

## 2024-02-23 NOTE — Progress Notes (Signed)
 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 He completed chemotherapy and radiation (08/03/23). Currently receiving immunotherapy with nivolumab . Recent imaging revealed progression at primary site. Planning to change to Folfox + Tevimbra.   DME: [facility] 12/19 - seen in ED for oral bleeding  12/20-12/22 admission with oral bleeding   Met with patient in infusion. He is coughing at visit. Likely from swallowing small amount of water  while wetting his mouth per infusion RN. Patient is NPO with pending MBS (1/23). Patient reports compliant with NPO, but does take sips of water  for dry mouth. Says he swishes/spits. He tried hard candy, but this made too much saliva. Patient is up to goal on tube feedings. Giving 1 1/2 cartons Nutren 1.5 QID. Patient unable to recall FWF. Says he gives additional water  in between feedings. Patient wants to stay well hydrated. Sister of patient purchased prosource TF (40 kcal, 11g per 45 ml). He has been doing this once daily. Reports taking this during admission and it gave him more energy. Asking if he can do this twice daily. Patient reports recent constipation, but this has resolved. He denies nausea or vomiting.   Medications: reviewed   Labs: glucose 136  Anthropometrics: Wt 229 lb 6.4 oz today decreased 5.4% in 4 weeks - this is severe for time frame (hospitalization)  12/29 - 233 lb 8 oz  12/16 - 239 lb  12/8 - 242 lb 12.8 oz   Estimated Energy Needs  Kcals: 2400-2700 Protein: 120-135 Fluid: >/= 2.4 L  NUTRITION DIAGNOSIS: Unintended wt loss continues - addressing with TF    INTERVENTION:  Continue 6 cartons Nutren 1.5 via tube  Continue prosource TF - may increase to 2/day to better meet protein needs - samples of prosource TF 20 provided  This regimen provides 2330 kcal, 124 g protein Diet advancement per SLP   MONITORING, EVALUATION, GOAL: wt trends, TF   NEXT VISIT: Monday February 2 during infusion

## 2024-02-23 NOTE — Patient Instructions (Signed)
 [facility] - A DEPT OF [facility]  Discharge Instructions: Thank you for choosing [facility] to provide your oncology and hematology care.   If you have a lab appointment with the Cancer Center, please go directly to the Cancer Center and check in at the registration area.   Wear comfortable clothing and clothing appropriate for easy access to any Portacath or PICC line.   We strive to give you quality time with your provider. You may need to reschedule your appointment if you arrive late (15 or more minutes).  Arriving late affects you and other patients whose appointments are after yours.  Also, if you miss three or more appointments without notifying the office, you may be dismissed from the clinic at the provider's discretion.      For prescription refill requests, have your pharmacy contact our office and allow 72 hours for refills to be completed.    Today you received the following chemotherapy and/or immunotherapy agents: Opdivo    To help prevent nausea and vomiting after your treatment, we encourage you to take your nausea medication as directed.  BELOW ARE SYMPTOMS THAT SHOULD BE REPORTED IMMEDIATELY: *FEVER GREATER THAN 100.4 F (38 C) OR HIGHER *CHILLS OR SWEATING *NAUSEA AND VOMITING THAT IS NOT CONTROLLED WITH YOUR NAUSEA MEDICATION *UNUSUAL SHORTNESS OF BREATH *UNUSUAL BRUISING OR BLEEDING *URINARY PROBLEMS (pain or burning when urinating, or frequent urination) *BOWEL PROBLEMS (unusual diarrhea, constipation, pain near the anus) TENDERNESS IN MOUTH AND THROAT WITH OR WITHOUT PRESENCE OF ULCERS (sore throat, sores in mouth, or a toothache) UNUSUAL RASH, SWELLING OR PAIN  UNUSUAL VAGINAL DISCHARGE OR ITCHING   Items with * indicate a potential emergency and should be followed up as soon as possible or go to the Emergency Department if any problems should occur.  Please show the CHEMOTHERAPY ALERT CARD or IMMUNOTHERAPY ALERT  CARD at check-in to the Emergency Department and triage nurse.  Should you have questions after your visit or need to cancel or reschedule your appointment, please contact [facility] - A DEPT OF [facility]  Dept: [phone]  and follow the prompts.  Office hours are 8:00 a.m. to 4:30 p.m. Monday - Friday. Please note that voicemails left after 4:00 p.m. may not be returned until the following business day.  We are closed weekends and major holidays. You have access to a nurse at all times for urgent questions. Please call the main number to the clinic Dept: [phone] and follow the prompts.   For any non-urgent questions, you may also contact your provider using MyChart. We now offer e-Visits for anyone 24 and older to request care online for non-urgent symptoms. For details visit [email or web address].   Also download the MyChart app! Go to the app store, search MyChart, open the app, select [facility], and log in with your MyChart username and password.

## 2024-02-24 ENCOUNTER — Other Ambulatory Visit: Payer: Self-pay

## 2024-02-24 ENCOUNTER — Encounter: Payer: Self-pay | Admitting: Cardiology

## 2024-02-24 ENCOUNTER — Ambulatory Visit: Admitting: Cardiology

## 2024-02-24 VITALS — BP 129/82 | HR 85 | Ht 74.0 in | Wt 229.6 lb

## 2024-02-24 DIAGNOSIS — I10 Essential (primary) hypertension: Secondary | ICD-10-CM | POA: Diagnosis not present

## 2024-02-24 DIAGNOSIS — I5032 Chronic diastolic (congestive) heart failure: Secondary | ICD-10-CM | POA: Diagnosis not present

## 2024-02-24 DIAGNOSIS — E1165 Type 2 diabetes mellitus with hyperglycemia: Secondary | ICD-10-CM | POA: Diagnosis not present

## 2024-02-24 DIAGNOSIS — I77819 Aortic ectasia, unspecified site: Secondary | ICD-10-CM | POA: Diagnosis not present

## 2024-02-24 DIAGNOSIS — E782 Mixed hyperlipidemia: Secondary | ICD-10-CM | POA: Diagnosis not present

## 2024-02-24 DIAGNOSIS — G4733 Obstructive sleep apnea (adult) (pediatric): Secondary | ICD-10-CM

## 2024-02-24 NOTE — Patient Instructions (Signed)
 Medication Instructions:  Your physician recommends that you continue on your current medications as directed. Please refer to the Current Medication list given to you today.  *If you need a refill on your cardiac medications before your next appointment, please call your pharmacy*  Lab Work: None ordered If you have labs (blood work) drawn today and your tests are completely normal, you will receive your results only by: MyChart Message (if you have MyChart) OR A paper copy in the mail If you have any lab test that is abnormal or we need to change your treatment, we will call you to review the results.  Testing/Procedures: None ordered  Follow-Up: At [facility], you and your health needs are our priority.  As part of our continuing mission to provide you with exceptional heart care, our providers are all part of one team.  This team includes your primary Cardiologist (physician) and Advanced Practice Providers or APPs (Physician Assistants and Nurse Practitioners) who all work together to provide you with the care you need, when you need it.  Your next appointment:   1 year(s)  Provider:   Madonna Large, DO    We recommend signing up for the patient portal called MyChart.  Sign up information is provided on this After Visit Summary.  MyChart is used to connect with patients for Virtual Visits (Telemedicine).  Patients are able to view lab/test results, encounter notes, upcoming appointments, etc.  Non-urgent messages can be sent to your provider as well.   To learn more about what you can do with MyChart, go to [email or web address].

## 2024-02-24 NOTE — Progress Notes (Signed)
 " Cardiology Office Note:  .   Date:  02/24/2024  ID:  Melvin Hudson, DOB Jan 09, 1956, MRN [id number] PCP:  Haze Kingfisher, MD  Former Cardiology Providers: Emmalene Lawrence, APRN, FNP-C  [facility] Providers Cardiologist:  Madonna Large, DO , [facility] (established care 09/13/2019 ) Electrophysiologist:  None  Click to update primary MD,subspecialty MD or APP then REFRESH:1}    Chief Complaint  Patient presents with   Follow-up    HFpEF    History of Present Illness: .   Melvin Hudson is a 69 y.o. African-American male whose past medical history and cardiovascular risk factors includes: DVT (05/2023), GI bleed (February and March 2024), history of PRBCs, hypertension, diabetes mellitus, former tobacco use, history of throat cancer diagnosis in 2015, history of esophageal strictures requiring dilatation, history of esophageal cancer no significant epicardial coronary disease per angiography in 2012, chronic heart failure with preserved EF, OSA on CPAP, advanced age.   Patient is being followed to the practice given his history of HFpEF and hyperlipidemia.  Since last office visit he was seen in the ED in November 2025 for atypical chest pain.  He followed up with Lonell Bring, PA-C at which time echocardiogram was ordered which noted low normal LVEF, no significant regional wall motion abnormalities or significant valvular heart disease.  He presents today for follow-up.  Denies anginal chest pain or heart failure symptoms. Overall functional capacity remains relatively stable. No heart failure hospitalizations since last office visit.  Review of Systems: .   Review of Systems  Cardiovascular:  Negative for chest pain, claudication, irregular heartbeat, leg swelling, near-syncope, orthopnea, palpitations, paroxysmal nocturnal dyspnea and syncope.  Respiratory:  Negative for shortness of breath.   Hematologic/Lymphatic: Negative for bleeding problem.    Studies Reviewed:     Echocardiogram: 03/28/2022: Left ventricle cavity is normal in size. Mild eccentric hypertrophy of the left ventricle. Normal global wall motion. Normal LV systolic function with visual EF 50-55%. Doppler evidence of grade I (impaired) diastolic dysfunction, normal LAP. The aortic root is mildly dilated at 3.7 cm. Mildly dilated ascending aorta at 4.0 cm. Ascending aorta was not well visualized on previous study in 2020. LA was reported mildly dilated then.   02/10/2024  1. Left ventricular ejection fraction, by estimation, is 50 to 55%. Left  ventricular ejection fraction by 3D volume is 53 %. The left ventricle has  low normal function. The left ventricle has no regional wall motion  abnormalities. Left ventricular  diastolic parameters are consistent with Grade I diastolic dysfunction  (impaired relaxation).   2. Right ventricular systolic function is normal. The right ventricular  size is normal. There is normal pulmonary artery systolic pressure.   3. The mitral valve is normal in structure. Trivial mitral valve  regurgitation. No evidence of mitral stenosis.   4. The aortic valve is calcified. Aortic valve regurgitation is not  visualized. No aortic stenosis is present.   5. Aortic dilatation noted. There is borderline dilatation of the  ascending aorta, measuring 39 mm.   6. The inferior vena cava is normal in size with greater than 50%  respiratory variability, suggesting right atrial pressure of 3 mmHg.    Stress Testing:  Lexiscan  Myoview  stress test 06/28/2018: Low risk study  Coronary Angiogram  [12/31/2010]: Heart cath 12/31/10: Noral LVEF. Slow flow in coronary arteries without stenosis. Mild pulmonary HTN.    RADIOLOGY: NA  Risk Assessment/Calculations:   NA   Labs:       Latest Ref Rng &  Units 02/23/2024    9:30 AM 02/15/2024   10:46 AM 02/08/2024    5:33 AM  CBC  WBC 4.0 - 10.5 K/uL 8.2  8.6  8.4   Hemoglobin 13.0 - 17.0 g/dL 89.1  88.8  89.2    Hematocrit 39.0 - 52.0 % 32.6  33.5  32.5   Platelets 150 - 400 K/uL 331  324  291        Latest Ref Rng & Units 02/23/2024    9:30 AM 02/15/2024   10:46 AM 02/07/2024    3:00 AM  BMP  Glucose 70 - 99 mg/dL 863  834  890   BUN 8 - 23 mg/dL 20  23  16    Creatinine 0.61 - 1.24 mg/dL 9.25  9.16  9.41   Sodium 135 - 145 mmol/L 143  144  144   Potassium 3.5 - 5.1 mmol/L 4.0  3.9  3.7   Chloride 98 - 111 mmol/L 105  106  107   CO2 22 - 32 mmol/L 31  28  28    Calcium  8.9 - 10.3 mg/dL 9.3  9.2  9.3       Latest Ref Rng & Units 02/23/2024    9:30 AM 02/15/2024   10:46 AM 02/07/2024    3:00 AM  CMP  Glucose 70 - 99 mg/dL 863  834  890   BUN 8 - 23 mg/dL 20  23  16    Creatinine 0.61 - 1.24 mg/dL 9.25  9.16  9.41   Sodium 135 - 145 mmol/L 143  144  144   Potassium 3.5 - 5.1 mmol/L 4.0  3.9  3.7   Chloride 98 - 111 mmol/L 105  106  107   CO2 22 - 32 mmol/L 31  28  28    Calcium  8.9 - 10.3 mg/dL 9.3  9.2  9.3   Total Protein 6.5 - 8.1 g/dL 7.9  7.6    Total Bilirubin 0.0 - 1.2 mg/dL 0.6  0.6    Alkaline Phos 38 - 126 U/L 144  149    AST 15 - 41 U/L 22  22    ALT 0 - 44 U/L 25  24      Lab Results  Component Value Date   CHOL 85 (L) 04/27/2023   HDL 33 (L) 04/27/2023   LDLCALC 37 04/27/2023   LDLDIRECT 39 04/27/2023   TRIG 64 04/27/2023   CHOLHDL 2.6 04/27/2023   No results for input(s): LIPOA in the last 8760 hours. No components found for: NTPROBNP No results for input(s): PROBNP in the last 8760 hours. Recent Labs    01/04/24 1119 01/25/24 0814 02/02/24 1007  TSH 2.170 1.880 2.270     Physical Exam:    Today's Vitals   02/24/24 0900  BP: 129/82  Pulse: 85  SpO2: 96%  Weight: 229 lb 9.6 oz (104.1 kg)  Height: 6' 2 (1.88 m)   Body mass index is 29.48 kg/m. Wt Readings from Last 3 Encounters:  02/24/24 229 lb 9.6 oz (104.1 kg)  02/23/24 229 lb 6.4 oz (104.1 kg)  02/15/24 233 lb 8 oz (105.9 kg)    Physical Exam  Constitutional: No distress.   hemodynamically stable  Neck: No JVD present.  Cardiovascular: Normal rate, regular rhythm, S1 normal and S2 normal. Exam reveals no gallop, no S3 and no S4.  No murmur heard. Pulmonary/Chest: Effort normal and breath sounds normal. No stridor. He has no wheezes. He has no rales.  Abdominal: Soft.  He exhibits no distension. There is no abdominal tenderness.  Feeding tube site is dry, not erythematous, no drainage  Musculoskeletal:        General: No edema.     Cervical back: Neck supple.  Skin: Skin is warm.     Impression & Recommendation(s):  Impression:   ICD-10-CM   1. Chronic heart failure with preserved ejection fraction (HFpEF) (HCC)  I50.32     2. Dilatation of aorta  I77.819     3. Mixed hyperlipidemia  E78.2     4. Essential hypertension  I10     5. Type 2 diabetes mellitus with hyperglycemia, without long-term current use of insulin  (HCC)  E11.65     6. OSA on CPAP  G47.33         Recommendation(s):  Chronic heart failure with preserved ejection fraction (HFpEF) (HCC) Overall euvolemic. Echo December 2025: LVEF 50-35%, grade 1 diastolic dysfunction, right ventricular size and function normal, no significant valvular heart disease. Continue carvedilol  3.125 mg p.o. twice daily. Continue Farxiga  10 mg p.o. daily. Continue BiDil  20/37.5 mg p.o. twice daily. Procardia  and spironolactone  discontinued since last office visit likely due to softer blood pressures given his weight loss.  Dilatation of aorta Echo February 2024: Ascending aorta 40 mm Echo December 2025: Ascending aorta 39 mm  Mixed hyperlipidemia Continue atorvastatin  40 mg p.o. daily. Patient does not endorse myalgias or side effects.  Essential hypertension Office blood pressures are very well-controlled. Medications as discussed above  Type 2 diabetes mellitus with hyperglycemia, without long-term current use of insulin  (HCC) Emphasized importance of glycemic control Managed by PCP  OSA  on CPAP Endorses compliance with CPAP  Discussed management of at least 2 chronic comorbid conditions. Reviewed echo results which are new compared to the last office visit from February 10, 2024. Prescription drug management. Coordination of care.  Orders Placed:  No orders of the defined types were placed in this encounter.  Final Medication List:    No orders of the defined types were placed in this encounter.   There are no discontinued medications.    Current Outpatient Medications:    albuterol  (VENTOLIN  HFA) 108 (90 Base) MCG/ACT inhaler, Inhale 2 puffs into the lungs every 4 (four) hours as needed for wheezing (or coughing)., Disp: , Rfl:    allopurinol  (ZYLOPRIM ) 300 MG tablet, Place 300 mg into feeding tube daily as needed (for gout flares)., Disp: , Rfl:    apixaban  (ELIQUIS ) 5 MG TABS tablet, Take 1 tablet (5 mg total) by mouth 2 (two) times daily., Disp: 60 tablet, Rfl: 1   atorvastatin  (LIPITOR) 40 MG tablet, Take 1 tablet (40 mg total) by mouth at bedtime. (Patient taking differently: Place 40 mg into feeding tube at bedtime.), Disp: 90 tablet, Rfl: 1   bismuth subsalicylate (PEPTO BISMOL) 262 MG/15ML suspension, Place 30 mLs into feeding tube every 6 (six) hours as needed for indigestion or diarrhea or loose stools., Disp: , Rfl:    Blood Glucose Monitoring Suppl (ONETOUCH VERIO FLEX SYSTEM) w/Device KIT, USE TO CHECK BLOOD SUGAR TWICE DAILY, Disp: , Rfl:    calcium  carbonate (TUMS - DOSED IN MG ELEMENTAL CALCIUM ) 500 MG chewable tablet, Chew 2 tablets (400 mg of elemental calcium  total) by mouth 3 (three) times daily with meals. (Patient taking differently: Place 400 mg of elemental calcium  into feeding tube daily as needed for indigestion or heartburn.), Disp: 180 tablet, Rfl: 1   carvedilol  (COREG ) 3.125 MG tablet, Place 3.125 mg into feeding tube  2 (two) times daily with a meal., Disp: , Rfl:    cetirizine (ZYRTEC) 10 MG tablet, Place 10 mg into feeding tube daily as  needed for allergies or rhinitis., Disp: , Rfl:    clonazePAM  (KLONOPIN ) 1 MG disintegrating tablet, Place 1 mg into feeding tube daily as needed (for anxiety)., Disp: , Rfl:    dapagliflozin  propanediol (FARXIGA ) 10 MG TABS tablet, 10 mg See admin instructions. 10 mg, per tube, once a day, Disp: , Rfl:    diphenhydrAMINE  (BENADRYL ) 25 MG tablet, Take 1 tablet (25 mg total) by mouth every 6 (six) hours. (Patient taking differently: Take 25 mg by mouth every 6 (six) hours as needed for itching.), Disp: 20 tablet, Rfl: 0   doxycycline  (VIBRA -TABS) 100 MG tablet, Take 1 tablet (100 mg total) by mouth 2 (two) times daily., Disp: 14 tablet, Rfl: 0   famotidine  (PEPCID ) 40 MG/5ML suspension, PLACE 2.5 MILLILITERS INTO THE FEEDING TUBE DAILY (Patient taking differently: Place 20 mg into feeding tube daily before breakfast.), Disp: 50 mL, Rfl: 1   gabapentin  (NEURONTIN ) 300 MG capsule, Place 300 mg into feeding tube 2 (two) times daily. Feeding tube, Disp: , Rfl: 2   HYDROcodone -acetaminophen  (HYCET) 7.5-325 mg/15 ml solution, Take 10-15 mLs by mouth every 6 (six) hours as needed for moderate pain (pain score 4-6)., Disp: 473 mL, Rfl: 0   hydrocortisone  (ANUSOL -HC) 2.5 % rectal cream, Place 1 Application rectally daily as needed for hemorrhoids or anal itching., Disp: , Rfl:    isosorbide -hydrALAZINE  (BIDIL ) 20-37.5 MG tablet, TAKE 1 TABLET BY MOUTH IN THE MORNING AND AT BEDTIME (Patient taking differently: Place 1 tablet into feeding tube in the morning and at bedtime.), Disp: 180 tablet, Rfl: 1   lidocaine -prilocaine  (EMLA ) cream, Apply 1 Application topically as needed (as directed)., Disp: 5 g, Rfl: 0   magic mouthwash (nystatin , lidocaine , diphenhydrAMINE , alum & mag hydroxide) suspension, Swish and Swallow 5 mLs by mouth 4 (four) times daily as needed for mouth pain. Suspension contains equal amounts of Maalox Extra Strength, nystatin , diphenhydramine  and lidocaine ., Disp: 140 mL, Rfl: 1   Nutritional  Supplements (NUTREN 1.5) LIQD, Nutren 1.5 - give 2 cartons TID (1500 ml/day). Flush tube with 60 ml water  before and after each bolus + additional 200 ml free water  QID. Provides 2250 kcal, 102 g, 1146 ml free water  (2306 ml total water ). Meets >85% of needs., Disp: , Rfl:    ondansetron  (ZOFRAN ) 8 MG tablet, Take 1 tablet (8 mg total) by mouth every 8 (eight) hours as needed for nausea or vomiting. (Patient taking differently: Place 8 mg into feeding tube every 8 (eight) hours as needed for nausea or vomiting.), Disp: 20 tablet, Rfl: 2   ONETOUCH VERIO test strip, USE TO CHECK BLOOD SUGAR TWICE DAILY, Disp: , Rfl:    polyethylene glycol (MIRALAX  / GLYCOLAX ) 17 g packet, Take 17 g by mouth daily. (Patient taking differently: Place 17 g into feeding tube daily as needed for mild constipation or moderate constipation.), Disp: 14 each, Rfl: 0   prochlorperazine  (COMPAZINE ) 10 MG tablet, Take 1 tablet (10 mg total) by mouth every 6 (six) hours as needed for nausea or vomiting. (Patient taking differently: Place 10 mg into feeding tube every 6 (six) hours as needed for nausea or vomiting.), Disp: 30 tablet, Rfl: 2   Protein (FEEDING SUPPLEMENT, PROSOURCE TF20,) liquid, Place 60 mLs into feeding tube daily., Disp: 1800 mL, Rfl: 1   psyllium (HYDROCIL/METAMUCIL) 95 % PACK, Take 1 packet by mouth  daily. (Patient taking differently: Take 1 packet by mouth daily as needed for mild constipation or moderate constipation.), Disp: 240 each, Rfl: 0   Water  For Irrigation, Sterile (FREE WATER ) SOLN, Place 100 mLs into feeding tube every 6 (six) hours., Disp: , Rfl:   Consent:   NA  Disposition:   1 year follow-up sooner if needed  His questions and concerns were addressed to his satisfaction. He voices understanding of the recommendations provided during this encounter.    Signed, Madonna Michele HAS, [facility]  A Division of [facility] [address] [place], [place] [address]    "

## 2024-02-29 NOTE — Assessment & Plan Note (Signed)
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 -EGD on 05/27/2023 which showed a mass in the proximal esophagus, 2 to 3 cm below introitus.  Biopsy confirmed invasive squamous cell carcinoma.  -PET scan showed hypermetabolic lesion in the proximal esophagus, with a positive peritracheal lymph node.  No distant metastasis -Status post PEG feeding tube placement  -He started chemoRT with weekly carbo and Taxol  on 06/24/2023 and completed on 08/03/23 -ED visit for chest pain on 06/29/2023 -repeated upper EGD and biopsy was negative for residual tumor on 10/30/2023 - Given the significant residual hypermetabolic activity on the PET scan and his high risk for recurrence, I recommend nivolumab  for 1 year. He started on 11/30/2023 -PET on 01/21/2024 showed disease progression at primary site, no distant mets. Plan to change chemo to FOLFOX and Tislelizumab (Tevimbra). PD-L1 test on biopsy attempted but insufficient tissue

## 2024-03-01 ENCOUNTER — Telehealth: Payer: Self-pay

## 2024-03-01 ENCOUNTER — Inpatient Hospital Stay

## 2024-03-01 ENCOUNTER — Other Ambulatory Visit (HOSPITAL_COMMUNITY): Payer: Self-pay

## 2024-03-01 ENCOUNTER — Inpatient Hospital Stay (HOSPITAL_BASED_OUTPATIENT_CLINIC_OR_DEPARTMENT_OTHER): Admitting: Hematology

## 2024-03-01 VITALS — BP 110/72 | HR 90 | Temp 98.3°F | Resp 18 | Ht 74.0 in | Wt 230.3 lb

## 2024-03-01 DIAGNOSIS — C153 Malignant neoplasm of upper third of esophagus: Secondary | ICD-10-CM | POA: Diagnosis not present

## 2024-03-01 DIAGNOSIS — Z5112 Encounter for antineoplastic immunotherapy: Secondary | ICD-10-CM | POA: Diagnosis not present

## 2024-03-01 LAB — CBC WITH DIFFERENTIAL (CANCER CENTER ONLY)
Abs Immature Granulocytes: 0.08 K/uL — ABNORMAL HIGH (ref 0.00–0.07)
Basophils Absolute: 0 K/uL (ref 0.0–0.1)
Basophils Relative: 0 %
Eosinophils Absolute: 0.3 K/uL (ref 0.0–0.5)
Eosinophils Relative: 3 %
HCT: 33.7 % — ABNORMAL LOW (ref 39.0–52.0)
Hemoglobin: 11.2 g/dL — ABNORMAL LOW (ref 13.0–17.0)
Immature Granulocytes: 1 %
Lymphocytes Relative: 12 %
Lymphs Abs: 0.9 K/uL (ref 0.7–4.0)
MCH: 26.8 pg (ref 26.0–34.0)
MCHC: 33.2 g/dL (ref 30.0–36.0)
MCV: 80.6 fL (ref 80.0–100.0)
Monocytes Absolute: 0.8 K/uL (ref 0.1–1.0)
Monocytes Relative: 11 %
Neutro Abs: 5.7 K/uL (ref 1.7–7.7)
Neutrophils Relative %: 73 %
Platelet Count: 303 K/uL (ref 150–400)
RBC: 4.18 MIL/uL — ABNORMAL LOW (ref 4.22–5.81)
RDW: 19.6 % — ABNORMAL HIGH (ref 11.5–15.5)
WBC Count: 7.8 K/uL (ref 4.0–10.5)
nRBC: 0.3 % — ABNORMAL HIGH (ref 0.0–0.2)

## 2024-03-01 LAB — CMP (CANCER CENTER ONLY)
ALT: 28 U/L (ref 0–44)
AST: 22 U/L (ref 15–41)
Albumin: 3.7 g/dL (ref 3.5–5.0)
Alkaline Phosphatase: 147 U/L — ABNORMAL HIGH (ref 38–126)
Anion gap: 8 (ref 5–15)
BUN: 22 mg/dL (ref 8–23)
CO2: 31 mmol/L (ref 22–32)
Calcium: 9.3 mg/dL (ref 8.9–10.3)
Chloride: 102 mmol/L (ref 98–111)
Creatinine: 0.69 mg/dL (ref 0.61–1.24)
GFR, Estimated: >60 mL/min
Glucose, Bld: 97 mg/dL (ref 70–99)
Potassium: 4.2 mmol/L (ref 3.5–5.1)
Sodium: 142 mmol/L (ref 135–145)
Total Bilirubin: 0.7 mg/dL (ref 0.0–1.2)
Total Protein: 8 g/dL (ref 6.5–8.1)

## 2024-03-01 MED ORDER — MORPHINE SULFATE 20 MG/5ML PO SOLN
10.0000 mg | ORAL | 0 refills | Status: DC | PRN
  Filled 2024-03-01: qty 100, 4d supply, fill #0

## 2024-03-01 NOTE — Telephone Encounter (Signed)
 Received a secure chat request to schedule the patient for first-time FOLFOX treatment. The patient was scheduled at [facility] for treatment on Thursday, 03/03/2024 at 0800, with pump discontinuation planned for Saturday. This clinical research associate spoke directly with the patient to review the appointment date, time, and location. The patient verbalized understanding and confirmed the appointment, stating he will also refer to his MyChart messages for additional details.

## 2024-03-01 NOTE — Progress Notes (Signed)
 DISCONTINUE ON PATHWAY REGIMEN - Gastroesophageal     Cycles 1 through 8: A cycle is every 14 days:     Nivolumab     Cycles 9 and beyond: A cycle is every 28 days:     Nivolumab    **Always confirm dose/schedule in your pharmacy ordering system**  PRIOR TREATMENT: GEOS28: Nivolumab  240 mg IV q14 Days x 8 Cycles, Followed by Nivolumab  480 mg IV q28 Days for up to a Total of 1 Year  START ON PATHWAY REGIMEN - Gastroesophageal     Tislelizumab cycles: A cycle is every 21 days:     Tislelizumab-jsgr    Chemotherapy cycles: A cycle is every 14 days:     Oxaliplatin       Leucovorin       Fluorouracil       Fluorouracil    **Always confirm dose/schedule in your pharmacy ordering system**  Patient Characteristics: Distant Metastases (cM1/pM1) / Locally Recurrent Disease, Squamous Cell, Esophageal & GE Junction, First Line, PD?L1 Expression  PositiveCPS ? 1, Tislelizumab Preferred Disease Classification: Esophageal Histology: Squamous Cell Therapeutic Status: Local Recurrence (No Additional Staging) Line of Therapy: First Line PD-L1 Expression Status: PD-L1 Expression Positive CPS ? 1 Intent of Therapy: Curative Intent, Discussed with Patient

## 2024-03-01 NOTE — Progress Notes (Unsigned)
 " [facility]   [phone] Fax:([phone]) [phone]   Clinic Follow up Note   Patient Care Team: Haze Kingfisher, MD as PCP - General (Family Medicine) Michele Richardson, DO as PCP - Cardiology (Cardiology) Clance, Francis HERO, MD as Referring Physician (Pulmonary Disease) Lonn Hicks, MD as Consulting Physician (Hematology and Oncology) [name], MD as Attending Physician (Radiation Oncology) [name], MD as Consulting Physician (Hematology and Oncology)  Date of Service:  03/01/2024  CHIEF COMPLAINT: f/u of esophageal cancer  CURRENT THERAPY:  Pending FOLFOX and Tivembra  Oncology History   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 -EGD on 05/27/2023 which showed a mass in the proximal esophagus, 2 to 3 cm below introitus.  Biopsy confirmed invasive squamous cell carcinoma.  -PET scan showed hypermetabolic lesion in the proximal esophagus, with a positive peritracheal lymph node.  No distant metastasis -Status post PEG feeding tube placement  -He started chemoRT with weekly carbo and Taxol  on 06/24/2023 and completed on 08/03/23 -ED visit for chest pain on 06/29/2023 -repeated upper EGD and biopsy was negative for residual tumor on 10/30/2023 - Given the significant residual hypermetabolic activity on the PET scan and his high risk for recurrence, I recommend nivolumab  for 1 year. He started on 11/30/2023 -PET on 01/21/2024 showed disease progression at primary site, no distant mets. Plan to change chemo to FOLFOX and Tislelizumab (Tevimbra). PD-L1 test on biopsy attempted but insufficient tissue   Assessment & Plan Malignant neoplasm of upper third of esophagus Esophageal carcinoma with radiographic progression, currently causing dysphagia, airway compromise, and pain due to tumor abutment of the spine. He is NPO and has  a feeding tube. Recent chemotherapy was delayed due to hospitalization for bleeding. - Planned initiation of new chemotherapy regimen (FOLFOX: oxaliplatin  in office, 5-FU via home pump) pending insurance approval and scheduling. - Planned switch to a different immunotherapy agent Tevimbra from cycle 2. - Cancelled prior treatment to update to new regimen. - Coordinated with charge nurse to expedite chemotherapy start (target: later this week or early next week). - Discussed rationale for chemotherapy and immunotherapy changes with him and family due to disease progression.  Airway compromise secondary to esophageal tumor Progressive airway compromise with increased wheezing and mild dyspnea, likely from tumor-induced tracheal narrowing. Oxygen saturation remains 98% at rest and with ambulation. High risk for further airway obstruction as disease advances. - Ordered stat non-contrast chest CT to assess airway and tumor relationship. - Discussed possible pulmonology referral for airway stenting if significant tracheal narrowing is identified. - Assessed oxygen saturation at rest and with ambulation; oxygenation maintained. - Planned expedited chemotherapy to address tumor-related airway compromise.  Neoplasm-related pain Severe, persistent upper back pain secondary to tumor abutment of the spine. Current hydrocodone  regimen provides only partial relief, with pain significantly impacting quality of life and function. - Transitioned analgesia from hydrocodone  to liquid morphine  via feeding tube, starting with a small bottle and titrating as needed. - Referred to palliative care nurse practitioner for ongoing pain and symptom management. - Instructed to continue hydrocodone  until morphine  is initiated. - Provided education on pain management and bowel regimen to prevent opioid-induced constipation.  Opioid-induced constipation Opioid-induced constipation, intermittently controlled with  over-the-counter agents (Miralax ). Anticipated increased risk with higher opioid doses. - Advised daily Miralax , increasing to two or three times daily as needed to maintain regular bowel movements. - Provided anticipatory  guidance regarding increased constipation risk with higher opioid doses and emphasized importance of regular bowel regimen.  Plan - Patient is very symptomatic from his esophageal cancer, including wheezing, and severe back pain - Will change treatment to FOLFOX, plan to start in a few days.  Plan to add Tevimbra from cycle 2 - Status CT chest in the next few days to evaluate his airway, his oxygen level maintained during his 6 minutes walking in the office. -f/u in one week  - I called in liquid morphine  to replace hydrocodone , and made an urgent referral to palliative care NP Nikki   SUMMARY OF ONCOLOGIC HISTORY: Oncology History  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HCC)  06/14/2013 Procedure   Biopsy of the tonsil confirmrf squamous cell carcinoma. 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 Progressive  circumferential wall thickening of the esophagus with associated diffuse hypermetabolic activity concerning for progressive esophageal cancer. 2. No evidence of adjacent hypermetabolic thoracic adenopathy or distant metastatic disease. 3. Mildly prominent activity near the hepatic flexure of the colon without obvious corresponding abnormality on the CT images. This is nonspecific, and could be physiologic. Correlate with colon cancer screening history. 4.  Aortic Atherosclerosis (ICD10-I70.0).     03/03/2024 -  Chemotherapy   Patient is on Treatment Plan : GASTRIC FOLFOX q14d x 12 cycles        Discussed the use of AI scribe software for clinical note transcription with the patient, who gave verbal consent to proceed.  History of Present Illness Melvin Hudson is a 69 year old male with locally advanced esophageal cancer who presents for oncology follow-up after recent hospitalization for tumor-related bleeding and delay in chemotherapy.  He has locally advanced esophageal cancer with local progression despite prior chemotherapy, radiation, and nivolumab . He has severe dysphagia, cannot tolerate oral intake, and depends on a feeding tube for nutrition and medications. Last month he was hospitalized for hematemesis, underwent a swallowing x-ray, and his next chemotherapy was canceled while planning a change in regimen.  Over the past week he has had worsening gurgling, wheezing, and mild shortness of breath without improvement despite antibiotics for presumed bronchitis. He describes his breathing as terrible and gurgling. His sister notes increased weakness. He ambulates slowly, including on stairs, with frequent rests due to fatigue and dyspnea. Imaging has shown the esophageal tumor causing marked luminal narrowing and contacting the trachea.  He has severe, constant upper back pain that is only partially relieved by liquid hydrocodone  15 cc four times daily. He feels this regimen is  inadequate and is running low on medication.  Pain is worsened by talking and limits daily activities.  He has opioid-induced constipation treated with Maalox and Miralax . Miralax  is effective when taken regularly, and he understands the need for a consistent bowel regimen with increased opioid use. He has chronic abdominal bloating without new or worsening abdominal pain. He remains able to shower and is ambulatory, though slower and more fatigued.     All other systems were reviewed with the patient and are negative.  MEDICAL HISTORY:  Past Medical History:  Diagnosis Date   Angioedema 09/17/2011   tongue and lips   Arthritis    both of my legs and feet (01/05/2014)   CHF (congestive heart failure) (HCC)    Diabetes mellitus without complication (HCC)    type 2   DVT (deep venous thrombosis) (HCC) 06/16/2023   LLE   Dysphagia    Erectile dysfunction 04/20/2018   Esophageal cancer (HCC)    GI bleed    Heart murmur 04/20/2018   History of radiation therapy    06/24/2023- 08/03/2023 Dr. 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    SURGICAL HISTORY: Past Surgical History:  Procedure Laterality Date   COLONOSCOPY Left 04/11/2014   Procedure: COLONOSCOPY;  Surgeon: Elsie Cree, MD;  Location: [facility] ENDOSCOPY;  Service: Endoscopy;  Laterality: Left;   COLONOSCOPY N/A 04/20/2014   Procedure: COLONOSCOPY;  Surgeon: Jerrell KYM Sol, MD;  Location: [facility] ENDOSCOPY;  Service: Endoscopy;  Laterality: N/A;   COLONOSCOPY W/ BIOPSIES AND POLYPECTOMY     benign   DIRECT LARYNGOSCOPY  01/05/2014   DIRECT LARYNGOSCOPY N/A 01/05/2014   Procedure: DIRECT LARYNGOSCOPY AND BIOPSY;  Surgeon: Ida Loader, MD;  Location: [facility] OR;  Service: ENT;  Laterality: N/A;   ESOPHAGEAL DILATION N/A  01/01/2024   Procedure: DILATION, ESOPHAGUS;  Surgeon: Loader Ida, MD;  Location: [facility] OR;  Service: ENT;  Laterality: N/A;   ESOPHAGOSCOPY WITH DILITATION N/A 05/27/2023   Procedure: ESOPHAGOSCOPY;  Surgeon: Loader Ida, MD;  Location: [facility] OR;  Service: ENT;  Laterality: N/A;   ESOPHAGOSCOPY WITH DILITATION N/A 10/30/2023   Procedure: ESOPHAGOSCOPY, WITH BIOPSY;  Surgeon: Loader Ida, MD;  Location: [facility] OR;  Service: ENT;  Laterality: N/A;  With possible biopsy   IR GASTROSTOMY TUBE MOD SED  06/12/2023   IR IMAGING GUIDED PORT INSERTION  11/24/2023   IR RADIOLOGIST EVAL & MGMT  08/10/2023   IR RADIOLOGIST EVAL & MGMT  08/14/2023   KNEE ARTHROSCOPY Right ~ 1994   LEFT AND RIGHT HEART CATHETERIZATION WITH CORONARY/GRAFT ANGIOGRAM N/A 12/31/2010   Procedure: LEFT AND RIGHT HEART CATHETERIZATION WITH EL BILE;  Surgeon: Erick JONELLE Bergamo, MD;  Location: [facility] CATH LAB;  Service: Cardiovascular;  Laterality: N/A;   MULTIPLE EXTRACTIONS WITH ALVEOLOPLASTY N/A 07/08/2013   Procedure: Extraction of tooth #'s 22, 27 with alveoloplasty and bilateal mandibular facial exostoses reductions;  Surgeon: Tanda JULIANNA Fanny, DDS;  Location: [facility] ORS;  Service: Oral Surgery;  Laterality: N/A;   MULTIPLE TOOTH EXTRACTIONS  ~ 2008    I have reviewed the social history and family history with the patient and they are unchanged from previous note.  ALLERGIES:  is allergic to azilsartan and advil [ibuprofen].  MEDICATIONS:  Current Outpatient Medications  Medication Sig Dispense Refill   morphine   20 MG/5ML solution Take 2.5-5 mLs (10-20 mg total) by mouth every 4 (four) hours as needed for pain. 100 mL 0   albuterol  (VENTOLIN  HFA) 108 (90 Base) MCG/ACT inhaler Inhale 2 puffs into the lungs every 4 (four) hours as needed for wheezing (or coughing).     allopurinol  (ZYLOPRIM ) 300 MG tablet Place 300 mg into feeding tube daily as needed (for gout flares).     apixaban  (ELIQUIS ) 5 MG TABS tablet Take 1 tablet (5  mg total) by mouth 2 (two) times daily. 60 tablet 1   atorvastatin  (LIPITOR) 40 MG tablet Take 1 tablet (40 mg total) by mouth at bedtime. (Patient taking differently: Place 40 mg into feeding tube at bedtime.) 90 tablet 1   bismuth subsalicylate (PEPTO BISMOL) 262 MG/15ML suspension Place 30 mLs into feeding tube every 6 (six) hours as needed for indigestion or diarrhea or loose stools.     Blood Glucose Monitoring Suppl (ONETOUCH VERIO FLEX SYSTEM) w/Device KIT USE TO CHECK BLOOD SUGAR TWICE DAILY     calcium  carbonate (TUMS - DOSED IN MG ELEMENTAL CALCIUM ) 500 MG chewable tablet Chew 2 tablets (400 mg of elemental calcium  total) by mouth 3 (three) times daily with meals. (Patient taking differently: Place 400 mg of elemental calcium  into feeding tube daily as needed for indigestion or heartburn.) 180 tablet 1   carvedilol  (COREG ) 3.125 MG tablet Place 3.125 mg into feeding tube 2 (two) times daily with a meal.     cetirizine (ZYRTEC) 10 MG tablet Place 10 mg into feeding tube daily as needed for allergies or rhinitis.     clonazePAM  (KLONOPIN ) 1 MG disintegrating tablet Place 1 mg into feeding tube daily as needed (for anxiety).     dapagliflozin  propanediol (FARXIGA ) 10 MG TABS tablet 10 mg See admin instructions. 10 mg, per tube, once a day     diphenhydrAMINE  (BENADRYL ) 25 MG tablet Take 1 tablet (25 mg total) by mouth every 6 (six) hours. (Patient taking differently: Take 25 mg by mouth every 6 (six) hours as needed for itching.) 20 tablet 0   doxycycline  (VIBRA -TABS) 100 MG tablet Take 1 tablet (100 mg total) by mouth 2 (two) times daily. 14 tablet 0   famotidine  (PEPCID ) 40 MG/5ML suspension PLACE 2.5 MILLILITERS INTO THE FEEDING TUBE DAILY (Patient taking differently: Place 20 mg into feeding tube daily before breakfast.) 50 mL 1   gabapentin  (NEURONTIN ) 300 MG capsule Place 300 mg into feeding tube 2 (two) times daily. Feeding tube  2   hydrocortisone  (ANUSOL -HC) 2.5 % rectal cream Place 1  Application rectally daily as needed for hemorrhoids or anal itching.     isosorbide -hydrALAZINE  (BIDIL ) 20-37.5 MG tablet TAKE 1 TABLET BY MOUTH IN THE MORNING AND AT BEDTIME (Patient taking differently: Place 1 tablet into feeding tube in the morning and at bedtime.) 180 tablet 1   lidocaine -prilocaine  (EMLA ) cream Apply 1 Application topically as needed (as directed). 5 g 0   magic mouthwash (nystatin , lidocaine , diphenhydrAMINE , alum & mag hydroxide) suspension Swish and Swallow 5 mLs by mouth 4 (four) times daily as needed for mouth pain. Suspension contains equal amounts of Maalox Extra Strength, nystatin , diphenhydramine  and lidocaine . 140 mL 1   Nutritional Supplements (NUTREN 1.5) LIQD Nutren 1.5 - give 2 cartons TID (1500 ml/day). Flush tube with 60 ml water  before and after each bolus + additional 200 ml free water  QID. Provides 2250 kcal, 102 g, 1146 ml free water  (2306 ml total water ). Meets >85% of needs.  ondansetron  (ZOFRAN ) 8 MG tablet Take 1 tablet (8 mg total) by mouth every 8 (eight) hours as needed for nausea or vomiting. (Patient taking differently: Place 8 mg into feeding tube every 8 (eight) hours as needed for nausea or vomiting.) 20 tablet 2   ONETOUCH VERIO test strip USE TO CHECK BLOOD SUGAR TWICE DAILY     polyethylene glycol (MIRALAX  / GLYCOLAX ) 17 g packet Take 17 g by mouth daily. (Patient taking differently: Place 17 g into feeding tube daily as needed for mild constipation or moderate constipation.) 14 each 0   prochlorperazine  (COMPAZINE ) 10 MG tablet Take 1 tablet (10 mg total) by mouth every 6 (six) hours as needed for nausea or vomiting. (Patient taking differently: Place 10 mg into feeding tube every 6 (six) hours as needed for nausea or vomiting.) 30 tablet 2   Protein (FEEDING SUPPLEMENT, PROSOURCE TF20,) liquid Place 60 mLs into feeding tube daily. 1800 mL 1   psyllium (HYDROCIL/METAMUCIL) 95 % PACK Take 1 packet by mouth daily. (Patient taking differently: Take  1 packet by mouth daily as needed for mild constipation or moderate constipation.) 240 each 0   Water  For Irrigation, Sterile (FREE WATER ) SOLN Place 100 mLs into feeding tube every 6 (six) hours.     No current facility-administered medications for this visit.    PHYSICAL EXAMINATION: ECOG PERFORMANCE STATUS: 2 - Symptomatic, <50% confined to bed  Vitals:   03/01/24 1032 03/01/24 1033  BP:    Pulse: 97 90  Resp:    Temp:    SpO2: 96% 97%   Wt Readings from Last 3 Encounters:  03/01/24 230 lb 4.8 oz (104.5 kg)  02/24/24 229 lb 9.6 oz (104.1 kg)  02/23/24 229 lb 6.4 oz (104.1 kg)     GENERAL:alert, mild respiratory distress SKIN: skin color, texture, turgor are normal, no rashes or significant lesions EYES: normal, Conjunctiva are pink and non-injected, sclera clear NECK: supple, thyroid  normal size, non-tender, without nodularity LYMPH:  no palpable lymphadenopathy in the cervical, axillary  LUNGS: clear to auscultation and percussion with normal breathing effort, (+) wheezing and gurgling on both side lungs  HEART: regular rate & rhythm and no murmurs and no lower extremity edema ABDOMEN:abdomen soft, non-tender and normal bowel sounds Musculoskeletal:no cyanosis of digits and no clubbing  NEURO: alert & oriented x 3 with fluent speech, no focal motor/sensory deficits  Physical Exam   LABORATORY DATA:  I have reviewed the data as listed    Latest Ref Rng & Units 03/01/2024    9:12 AM 02/23/2024    9:30 AM 02/15/2024   10:46 AM  CBC  WBC 4.0 - 10.5 K/uL 7.8  8.2  8.6   Hemoglobin 13.0 - 17.0 g/dL 88.7  89.1  88.8   Hematocrit 39.0 - 52.0 % 33.7  32.6  33.5   Platelets 150 - 400 K/uL 303  331  324         Latest Ref Rng & Units 03/01/2024    9:12 AM 02/23/2024    9:30 AM 02/15/2024   10:46 AM  CMP  Glucose 70 - 99 mg/dL 97  863  834   BUN 8 - 23 mg/dL 22  20  23    Creatinine 0.61 - 1.24 mg/dL 9.30  9.25  9.16   Sodium 135 - 145 mmol/L 142  143  144   Potassium  3.5 - 5.1 mmol/L 4.2  4.0  3.9   Chloride 98 - 111 mmol/L 102  105  106   CO2 22 - 32 mmol/L 31  31  28    Calcium  8.9 - 10.3 mg/dL 9.3  9.3  9.2   Total Protein 6.5 - 8.1 g/dL 8.0  7.9  7.6   Total Bilirubin 0.0 - 1.2 mg/dL 0.7  0.6  0.6   Alkaline Phos 38 - 126 U/L 147  144  149   AST 15 - 41 U/L 22  22  22    ALT 0 - 44 U/L 28  25  24        RADIOGRAPHIC STUDIES: I have personally reviewed the radiological images as listed and agreed with the findings in the report. No results found.    Orders Placed This Encounter  Procedures   CT Chest Wo Contrast    Standing Status:   Future    Expected Date:   03/02/2024    Expiration Date:   03/01/2025    Preferred imaging location?:   [facility]   CBC with Differential (Cancer Center Only)    Standing Status:   Future    Expected Date:   03/03/2024    Expiration Date:   03/03/2025   CMP (Cancer Center only)    Standing Status:   Future    Expected Date:   03/03/2024    Expiration Date:   03/03/2025   All questions were answered. The patient knows to call the clinic with any problems, questions or concerns. No barriers to learning was detected. The total time spent in the appointment was 60 minutes, including review of chart and various tests results, discussions about plan of care and coordination of care plan     Onita Mattock, MD 03/01/2024      "

## 2024-03-02 ENCOUNTER — Encounter: Payer: Self-pay | Admitting: Hematology

## 2024-03-02 ENCOUNTER — Other Ambulatory Visit: Payer: Self-pay

## 2024-03-02 NOTE — Telephone Encounter (Signed)
 Error

## 2024-03-03 ENCOUNTER — Inpatient Hospital Stay

## 2024-03-03 VITALS — BP 135/79 | HR 71 | Temp 98.7°F | Resp 16 | Wt 229.5 lb

## 2024-03-03 DIAGNOSIS — C153 Malignant neoplasm of upper third of esophagus: Secondary | ICD-10-CM

## 2024-03-03 DIAGNOSIS — Z5112 Encounter for antineoplastic immunotherapy: Secondary | ICD-10-CM | POA: Diagnosis not present

## 2024-03-03 MED ORDER — DEXAMETHASONE SOD PHOSPHATE PF 10 MG/ML IJ SOLN
10.0000 mg | Freq: Once | INTRAMUSCULAR | Status: AC
  Administered 2024-03-03: 10 mg via INTRAVENOUS
  Filled 2024-03-03: qty 1

## 2024-03-03 MED ORDER — PALONOSETRON HCL INJECTION 0.25 MG/5ML
0.2500 mg | Freq: Once | INTRAVENOUS | Status: AC
  Administered 2024-03-03: 0.25 mg via INTRAVENOUS
  Filled 2024-03-03: qty 5

## 2024-03-03 MED ORDER — LEUCOVORIN CALCIUM INJECTION 350 MG
400.0000 mg/m2 | Freq: Once | INTRAVENOUS | Status: AC
  Administered 2024-03-03: 936 mg via INTRAVENOUS
  Filled 2024-03-03: qty 46.8

## 2024-03-03 MED ORDER — SODIUM CHLORIDE 0.9 % IV SOLN
2400.0000 mg/m2 | INTRAVENOUS | Status: DC
  Administered 2024-03-03: 5600 mg via INTRAVENOUS
  Filled 2024-03-03: qty 112

## 2024-03-03 MED ORDER — DEXTROSE 5 % IV SOLN: INTRAVENOUS | Status: DC

## 2024-03-03 MED ORDER — OXALIPLATIN CHEMO INJECTION 100 MG/20ML
85.0000 mg/m2 | Freq: Once | INTRAVENOUS | Status: AC
  Administered 2024-03-03: 200 mg via INTRAVENOUS
  Filled 2024-03-03: qty 40

## 2024-03-03 NOTE — Progress Notes (Signed)
 Pt here for first Folfox treatment. He has had other chemo in the past but Folfox is new for him. Esophageal cancer causing wheezing, pain and obstruction in esophagus/airway and severe pain for Dr. Demetra note from 03/01/24. Presents today with audible wheezing and pain 5/10 (0-10 scale). Reports this is his usual state of health. O2 sat 100% and remaining vitals stable (see flowsheet). Informed consent obtained. Meets treatment parameters (labs on 1/02/20/24).   Had pt view the [employer] video and signed consent. Reviewed spill kit and how to wear the pump. Reviewed AVS together (see AVS). He departed ambulatory with no further questions. Pump to be removed 03/05/24 at [facility] and pt is aware.

## 2024-03-03 NOTE — Patient Instructions (Addendum)
 [facility] - A DEPT OF [facility]  Discharge Instructions: Thank you for choosing [facility] to provide your oncology and hematology care.   If you have a lab appointment with the Cancer Center, please go directly to the Cancer Center and check in at the registration area.   Wear comfortable clothing and clothing appropriate for easy access to any Portacath or PICC line.   We strive to give you quality time with your provider. You may need to reschedule your appointment if you arrive late (15 or more minutes).  Arriving late affects you and other patients whose appointments are after yours.  Also, if you miss three or more appointments without notifying the office, you may be dismissed from the clinic at the providers discretion.      For prescription refill requests, have your pharmacy contact our office and allow 72 hours for refills to be completed.    Today you received the following chemotherapy and/or immunotherapy agents: oxaliplatin , leucovorin  and fluorouracil  (5FU).     Please remember the oxaliplatin  can cause cold sensitivity for at least 2-3 days. Please avoid drinking or eating anything cold or frozen. Also use gloves & wear a mask and hat if the air temperature is cold outside. Use gloves to get items out of the refrigerator/freezer.   To help prevent nausea and vomiting after your treatment, we encourage you to take your nausea medication as directed.  BELOW ARE SYMPTOMS THAT SHOULD BE REPORTED IMMEDIATELY: *FEVER GREATER THAN 100.4 F (38 C) OR HIGHER *CHILLS OR SWEATING *NAUSEA AND VOMITING THAT IS NOT CONTROLLED WITH YOUR NAUSEA MEDICATION *UNUSUAL SHORTNESS OF BREATH *UNUSUAL BRUISING OR BLEEDING *URINARY PROBLEMS (pain or burning when urinating, or frequent urination) *BOWEL PROBLEMS (unusual diarrhea, constipation, pain near the anus) TENDERNESS IN MOUTH AND THROAT WITH OR WITHOUT PRESENCE OF ULCERS (sore throat, sores  in mouth, or a toothache) UNUSUAL RASH, SWELLING OR PAIN  UNUSUAL VAGINAL DISCHARGE OR ITCHING   Items with * indicate a potential emergency and should be followed up as soon as possible or go to the Emergency Department if any problems should occur.  Please show the CHEMOTHERAPY ALERT CARD or IMMUNOTHERAPY ALERT CARD at check-in to the Emergency Department and triage nurse.  Should you have questions after your visit or need to cancel or reschedule your appointment, please contact [facility] - A DEPT OF [facility]  Dept: [phone]  and follow the prompts.  Office hours are 8:00 a.m. to 4:30 p.m. Monday - Friday. Please note that voicemails left after 4:00 p.m. may not be returned until the following business day.  We are closed weekends and major holidays. You have access to a nurse at all times for urgent questions. Please call the main number to the clinic Dept: [phone] and follow the prompts.   For any non-urgent questions, you may also contact your provider using MyChart. We now offer e-Visits for anyone 67 and older to request care online for non-urgent symptoms. For details visit [email or web address].   Also download the MyChart app! Go to the app store, search MyChart, open the app, select [place], and log in with your MyChart username and password. _______________________________________________ The chemotherapy medication bag should finish at 46 hours, 96 hours, or 7 days. For example, if your pump is scheduled for 46 hours and it was put on at 4:00 p.m., it should finish at 2:00 p.m. the day it is scheduled to  come off regardless of your appointment time.     Estimated time to finish at 10:30am.    If the display on your pump reads Low Volume and it is beeping, take the batteries out of the pump and come to the cancer center for it to be taken off.   If the pump alarms go off prior to the pump reading Low Volume then call  [phone] and someone can assist you.  If the plunger comes out and the chemotherapy medication is leaking out, please use your home chemo spill kit to clean up the spill. Do NOT use paper towels or other household products.  If you have problems or questions regarding your pump, please call either [phone] (24 hours a day) or the cancer center Monday-Friday 8:00 a.m.- 4:30 p.m. at the clinic number and we will assist you. If you are unable to get assistance, then go to the nearest Emergency Department and ask the staff to contact the IV team for assistance.

## 2024-03-04 ENCOUNTER — Ambulatory Visit (HOSPITAL_COMMUNITY)
Admission: RE | Admit: 2024-03-04 | Discharge: 2024-03-04 | Disposition: A | Discharge status: Home / Self Care | Source: Ambulatory Visit | Attending: Hematology | Admitting: Hematology

## 2024-03-04 ENCOUNTER — Encounter (HOSPITAL_COMMUNITY): Payer: Self-pay

## 2024-03-04 DIAGNOSIS — C153 Malignant neoplasm of upper third of esophagus: Secondary | ICD-10-CM | POA: Diagnosis present

## 2024-03-05 ENCOUNTER — Other Ambulatory Visit: Payer: Self-pay

## 2024-03-05 ENCOUNTER — Emergency Department (HOSPITAL_COMMUNITY)

## 2024-03-05 ENCOUNTER — Observation Stay (HOSPITAL_COMMUNITY)
Admission: EM | Admit: 2024-03-05 | Discharge: 2024-03-06 | Disposition: A | Attending: Emergency Medicine | Admitting: Emergency Medicine

## 2024-03-05 ENCOUNTER — Encounter (HOSPITAL_COMMUNITY): Payer: Self-pay | Admitting: Student

## 2024-03-05 ENCOUNTER — Inpatient Hospital Stay

## 2024-03-05 DIAGNOSIS — R1314 Dysphagia, pharyngoesophageal phase: Secondary | ICD-10-CM | POA: Diagnosis not present

## 2024-03-05 DIAGNOSIS — E785 Hyperlipidemia, unspecified: Secondary | ICD-10-CM | POA: Diagnosis not present

## 2024-03-05 DIAGNOSIS — Z79899 Other long term (current) drug therapy: Secondary | ICD-10-CM | POA: Insufficient documentation

## 2024-03-05 DIAGNOSIS — Z86718 Personal history of other venous thrombosis and embolism: Secondary | ICD-10-CM | POA: Insufficient documentation

## 2024-03-05 DIAGNOSIS — R0602 Shortness of breath: Secondary | ICD-10-CM | POA: Diagnosis not present

## 2024-03-05 DIAGNOSIS — I10 Essential (primary) hypertension: Secondary | ICD-10-CM | POA: Diagnosis present

## 2024-03-05 DIAGNOSIS — R0600 Dyspnea, unspecified: Principal | ICD-10-CM

## 2024-03-05 DIAGNOSIS — J9601 Acute respiratory failure with hypoxia: Principal | ICD-10-CM | POA: Insufficient documentation

## 2024-03-05 DIAGNOSIS — M545 Low back pain, unspecified: Secondary | ICD-10-CM | POA: Insufficient documentation

## 2024-03-05 DIAGNOSIS — I110 Hypertensive heart disease with heart failure: Secondary | ICD-10-CM | POA: Diagnosis not present

## 2024-03-05 DIAGNOSIS — Z8501 Personal history of malignant neoplasm of esophagus: Secondary | ICD-10-CM | POA: Insufficient documentation

## 2024-03-05 DIAGNOSIS — T17908A Unspecified foreign body in respiratory tract, part unspecified causing other injury, initial encounter: Secondary | ICD-10-CM

## 2024-03-05 DIAGNOSIS — K219 Gastro-esophageal reflux disease without esophagitis: Secondary | ICD-10-CM | POA: Insufficient documentation

## 2024-03-05 DIAGNOSIS — Z87891 Personal history of nicotine dependence: Secondary | ICD-10-CM | POA: Insufficient documentation

## 2024-03-05 DIAGNOSIS — I5032 Chronic diastolic (congestive) heart failure: Secondary | ICD-10-CM | POA: Insufficient documentation

## 2024-03-05 DIAGNOSIS — W449XXA Unspecified foreign body entering into or through a natural orifice, initial encounter: Secondary | ICD-10-CM | POA: Insufficient documentation

## 2024-03-05 DIAGNOSIS — J439 Emphysema, unspecified: Secondary | ICD-10-CM | POA: Insufficient documentation

## 2024-03-05 DIAGNOSIS — Z931 Gastrostomy status: Secondary | ICD-10-CM | POA: Insufficient documentation

## 2024-03-05 DIAGNOSIS — G4733 Obstructive sleep apnea (adult) (pediatric): Secondary | ICD-10-CM | POA: Diagnosis not present

## 2024-03-05 DIAGNOSIS — E119 Type 2 diabetes mellitus without complications: Secondary | ICD-10-CM | POA: Insufficient documentation

## 2024-03-05 LAB — CBC WITH DIFFERENTIAL/PLATELET
Abs Immature Granulocytes: 0.03 K/uL (ref 0.00–0.07)
Basophils Absolute: 0 K/uL (ref 0.0–0.1)
Basophils Relative: 0 %
Eosinophils Absolute: 0.2 K/uL (ref 0.0–0.5)
Eosinophils Relative: 2 %
HCT: 35.5 % — ABNORMAL LOW (ref 39.0–52.0)
Hemoglobin: 11.4 g/dL — ABNORMAL LOW (ref 13.0–17.0)
Immature Granulocytes: 0 %
Lymphocytes Relative: 4 %
Lymphs Abs: 0.4 K/uL — ABNORMAL LOW (ref 0.7–4.0)
MCH: 26.9 pg (ref 26.0–34.0)
MCHC: 32.1 g/dL (ref 30.0–36.0)
MCV: 83.7 fL (ref 80.0–100.0)
Monocytes Absolute: 0.5 K/uL (ref 0.1–1.0)
Monocytes Relative: 6 %
Neutro Abs: 7.5 K/uL (ref 1.7–7.7)
Neutrophils Relative %: 88 %
Platelets: 317 K/uL (ref 150–400)
RBC: 4.24 MIL/uL (ref 4.22–5.81)
RDW: 19.8 % — ABNORMAL HIGH (ref 11.5–15.5)
WBC: 8.6 K/uL (ref 4.0–10.5)
nRBC: 0 % (ref 0.0–0.2)

## 2024-03-05 LAB — COMPREHENSIVE METABOLIC PANEL WITH GFR
ALT: 20 U/L (ref 0–44)
AST: 24 U/L (ref 15–41)
Albumin: 3.6 g/dL (ref 3.5–5.0)
Alkaline Phosphatase: 129 U/L — ABNORMAL HIGH (ref 38–126)
Anion gap: 9 (ref 5–15)
BUN: 26 mg/dL — ABNORMAL HIGH (ref 8–23)
CO2: 30 mmol/L (ref 22–32)
Calcium: 9.1 mg/dL (ref 8.9–10.3)
Chloride: 103 mmol/L (ref 98–111)
Creatinine, Ser: 0.7 mg/dL (ref 0.61–1.24)
GFR, Estimated: >60 mL/min
Glucose, Bld: 182 mg/dL — ABNORMAL HIGH (ref 70–99)
Potassium: 3.8 mmol/L (ref 3.5–5.1)
Sodium: 143 mmol/L (ref 135–145)
Total Bilirubin: 0.8 mg/dL (ref 0.0–1.2)
Total Protein: 7.6 g/dL (ref 6.5–8.1)

## 2024-03-05 LAB — PRO BRAIN NATRIURETIC PEPTIDE: Pro Brain Natriuretic Peptide: 434 pg/mL — ABNORMAL HIGH

## 2024-03-05 LAB — RESP PANEL BY RT-PCR (RSV, FLU A&B, COVID)  RVPGX2
Influenza A by PCR: NEGATIVE
Influenza B by PCR: NEGATIVE
Resp Syncytial Virus by PCR: NEGATIVE
SARS Coronavirus 2 by RT PCR: NEGATIVE

## 2024-03-05 LAB — TROPONIN T, HIGH SENSITIVITY
Troponin T High Sensitivity: <15 ng/L (ref 0–19)
Troponin T High Sensitivity: <15 ng/L (ref 0–19)

## 2024-03-05 MED ORDER — CARVEDILOL 3.125 MG PO TABS
3.1250 mg | ORAL_TABLET | Freq: Two times a day (BID) | ORAL | Status: DC
  Administered 2024-03-06: 3.125 mg
  Filled 2024-03-05: qty 1

## 2024-03-05 MED ORDER — OSMOLITE 1.5 CAL PO LIQD
474.0000 mL | Freq: Three times a day (TID) | ORAL | Status: DC
  Administered 2024-03-05 – 2024-03-06 (×2): 474 mL
  Filled 2024-03-05 (×3): qty 474

## 2024-03-05 MED ORDER — FREE WATER
200.0000 mL | Freq: Four times a day (QID) | Status: DC
  Administered 2024-03-06: 200 mL

## 2024-03-05 MED ORDER — BISACODYL 5 MG PO TBEC: 5.0000 mg | DELAYED_RELEASE_TABLET | Freq: Every day | ORAL | Status: DC | PRN

## 2024-03-05 MED ORDER — METHYLPREDNISOLONE SODIUM SUCC 125 MG IJ SOLR
80.0000 mg | Freq: Every day | INTRAMUSCULAR | Status: DC
  Administered 2024-03-05 – 2024-03-06 (×2): 80 mg via INTRAVENOUS
  Filled 2024-03-05 (×2): qty 2

## 2024-03-05 MED ORDER — ACETAMINOPHEN 325 MG PO TABS
650.0000 mg | ORAL_TABLET | Freq: Four times a day (QID) | ORAL | Status: DC | PRN
  Filled 2024-03-05: qty 2

## 2024-03-05 MED ORDER — FREE WATER: 60.0000 mL | Status: DC | PRN

## 2024-03-05 MED ORDER — APIXABAN 5 MG PO TABS
5.0000 mg | ORAL_TABLET | Freq: Two times a day (BID) | ORAL | Status: DC
  Administered 2024-03-05 – 2024-03-06 (×2): 5 mg
  Filled 2024-03-05 (×2): qty 1

## 2024-03-05 MED ORDER — DAPAGLIFLOZIN PROPANEDIOL 10 MG PO TABS: 10.0000 mg | ORAL_TABLET | ORAL | Status: DC

## 2024-03-05 MED ORDER — IOHEXOL 350 MG/ML SOLN
75.0000 mL | Freq: Once | INTRAVENOUS | Status: AC | PRN
  Administered 2024-03-05: 75 mL via INTRAVENOUS

## 2024-03-05 MED ORDER — ATORVASTATIN CALCIUM 20 MG PO TABS
40.0000 mg | ORAL_TABLET | Freq: Every day | ORAL | Status: DC
  Administered 2024-03-05: 40 mg
  Filled 2024-03-05: qty 2

## 2024-03-05 MED ORDER — FAMOTIDINE 40 MG/5ML PO SUSR
20.0000 mg | Freq: Every day | ORAL | Status: DC
  Administered 2024-03-06: 20 mg
  Filled 2024-03-05: qty 2.5

## 2024-03-05 MED ORDER — SODIUM CHLORIDE 0.9% FLUSH
10.0000 mL | INTRAVENOUS | Status: DC | PRN
  Administered 2024-03-06: 10 mL

## 2024-03-05 MED ORDER — IPRATROPIUM-ALBUTEROL 0.5-2.5 (3) MG/3ML IN SOLN
3.0000 mL | Freq: Four times a day (QID) | RESPIRATORY_TRACT | Status: DC | PRN
  Administered 2024-03-05: 3 mL via RESPIRATORY_TRACT
  Filled 2024-03-05: qty 3

## 2024-03-05 MED ORDER — ONDANSETRON HCL 4 MG PO TABS: 4.0000 mg | ORAL_TABLET | Freq: Four times a day (QID) | ORAL | Status: DC | PRN

## 2024-03-05 MED ORDER — NUTREN 1.5 EN LIQD: ENTERAL | Status: DC

## 2024-03-05 MED ORDER — IPRATROPIUM-ALBUTEROL 0.5-2.5 (3) MG/3ML IN SOLN
3.0000 mL | Freq: Once | RESPIRATORY_TRACT | Status: AC
  Administered 2024-03-05: 3 mL via RESPIRATORY_TRACT
  Filled 2024-03-05: qty 3

## 2024-03-05 MED ORDER — CHLORHEXIDINE GLUCONATE CLOTH 2 % EX PADS
6.0000 | MEDICATED_PAD | Freq: Every day | CUTANEOUS | Status: DC
  Administered 2024-03-05 – 2024-03-06 (×2): 6 via TOPICAL

## 2024-03-05 MED ORDER — DAPAGLIFLOZIN PROPANEDIOL 10 MG PO TABS
10.0000 mg | ORAL_TABLET | Freq: Every day | ORAL | Status: DC
  Administered 2024-03-06: 10 mg via ORAL
  Filled 2024-03-05: qty 1

## 2024-03-05 MED ORDER — ISOSORB DINITRATE-HYDRALAZINE 20-37.5 MG PO TABS
1.0000 | ORAL_TABLET | Freq: Two times a day (BID) | ORAL | Status: DC
  Administered 2024-03-05 – 2024-03-06 (×2): 1
  Filled 2024-03-05 (×2): qty 1

## 2024-03-05 MED ORDER — SODIUM CHLORIDE 0.9% FLUSH
10.0000 mL | Freq: Two times a day (BID) | INTRAVENOUS | Status: DC
  Administered 2024-03-05 – 2024-03-06 (×2): 10 mL

## 2024-03-05 MED ORDER — GABAPENTIN 300 MG PO CAPS
300.0000 mg | ORAL_CAPSULE | Freq: Two times a day (BID) | ORAL | Status: DC
  Administered 2024-03-05 – 2024-03-06 (×2): 300 mg
  Filled 2024-03-05 (×2): qty 3

## 2024-03-05 MED ORDER — SENNOSIDES-DOCUSATE SODIUM 8.6-50 MG PO TABS: 1.0000 | ORAL_TABLET | Freq: Every evening | ORAL | Status: DC | PRN

## 2024-03-05 MED ORDER — ACETAMINOPHEN 650 MG RE SUPP: 650.0000 mg | Freq: Four times a day (QID) | RECTAL | Status: DC | PRN

## 2024-03-05 MED ORDER — ONDANSETRON HCL 4 MG/2ML IJ SOLN: 4.0000 mg | Freq: Four times a day (QID) | INTRAMUSCULAR | Status: DC | PRN

## 2024-03-05 MED ORDER — HYDROMORPHONE HCL 1 MG/ML IJ SOLN
0.5000 mg | Freq: Four times a day (QID) | INTRAMUSCULAR | Status: DC | PRN
  Administered 2024-03-05: 0.5 mg via INTRAVENOUS
  Filled 2024-03-05 (×2): qty 0.5

## 2024-03-05 NOTE — ED Triage Notes (Signed)
 Pt at [facility] center, was getting his chemo pump removed when they sent him over for SOB, audile wheeze/SOB , pt reports hx of throat ca, and sister reports he has been breathing like this for 2 weeks appx

## 2024-03-05 NOTE — Plan of Care (Signed)
   Problem: Education: Goal: Knowledge of General Education information will improve Description Including pain rating scale, medication(s)/side effects and non-pharmacologic comfort measures Outcome: Progressing

## 2024-03-05 NOTE — ED Provider Notes (Signed)
 " [place] EMERGENCY DEPARTMENT AT [facility] Provider Note   CSN: [id number] Arrival date & time: 03/05/24  1042     History {Add pertinent medical, surgical, social history, OB history to HPI:1} Chief Complaint  Patient presents with   Shortness of Breath    Melvin Hudson is a 69 y.o. male with h/o HTN, DVT on Eliquis , CHF, OSA on CPAP, gout, HTN, T2DM, and esophageal cancer s/p radiation and currently on chemotherapy who presents with SOB. [facility], was getting his chemo pump removed when they sent him over for SOB, audile wheeze/SOB , pt reports hx of throat ca, and sister reports he has been breathing like this for 2 weeks appx ***   Past Medical History:  Diagnosis Date   Angioedema 09/17/2011   tongue and lips   Arthritis    both of my legs and feet (01/05/2014)   CHF (congestive heart failure) (HCC)    Diabetes mellitus without complication (HCC)    type 2   DVT (deep venous thrombosis) (HCC) 06/16/2023   LLE   Dysphagia    Erectile dysfunction 04/20/2018   Esophageal cancer (HCC)    GI bleed    Heart murmur 04/20/2018   History of radiation therapy    06/24/2023- 08/03/2023 Dr. 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 Authorizing Provider  albuterol  (VENTOLIN  HFA) 108 (90 Base) MCG/ACT inhaler Inhale 2 puffs into the lungs every 4 (four) hours as needed for wheezing (or coughing). 04/02/23   [provider]  allopurinol  (ZYLOPRIM ) 300 MG tablet Place 300 mg into feeding tube daily as needed (for gout flares).    [provider]  apixaban  (ELIQUIS ) 5 MG TABS tablet Take 1 tablet (5 mg total) by mouth 2  (two) times daily. 02/23/24   Hanford Powell BRAVO, NP  atorvastatin  (LIPITOR) 40 MG tablet Take 1 tablet (40 mg total) by mouth at bedtime. Patient taking differently: Place 40 mg into feeding tube at bedtime. 10/07/19 02/24/24  [name], [name], DO  bismuth subsalicylate (PEPTO BISMOL) 262 MG/15ML suspension Place 30 mLs into feeding tube every 6 (six) hours as needed for indigestion or diarrhea or loose stools.    [provider]  Blood Glucose Monitoring Suppl (ONETOUCH VERIO FLEX SYSTEM) w/Device KIT USE TO CHECK BLOOD SUGAR TWICE DAILY 04/21/19   [provider]  calcium  carbonate (TUMS - DOSED IN MG ELEMENTAL CALCIUM ) 500 MG chewable tablet Chew 2 tablets (400 mg of elemental calcium  total) by mouth 3 (three) times daily with meals. Patient taking differently: Place 400 mg of elemental calcium  into feeding tube daily as needed for indigestion or heartburn. 04/18/22   [name], DO  carvedilol  (COREG ) 3.125 MG tablet Place 3.125 mg into feeding tube 2 (two) times daily with a meal.    [provider]  cetirizine (ZYRTEC) 10 MG tablet Place 10 mg into feeding tube daily as needed for allergies or rhinitis.    [provider]  clonazePAM  (KLONOPIN ) 1 MG disintegrating tablet Place 1 mg into feeding tube daily as needed (for anxiety). 03/10/22   [provider]  dapagliflozin  propanediol (FARXIGA ) 10 MG TABS tablet 10 mg See admin instructions. 10 mg, per tube, once a day    [provider]  diphenhydrAMINE  (BENADRYL ) 25 MG tablet Take 1 tablet (25 mg total) by mouth every 6 (six) hours. Patient taking differently: Take 25 mg by mouth every 6 (six) hours as needed for itching. 10/29/13   [name], MD  doxycycline  (VIBRA -TABS) 100 MG tablet Take 1 tablet (100 mg total) by mouth 2 (two) times daily. 02/23/24   Boscia, Heather E, NP  famotidine  (PEPCID ) 40 MG/5ML suspension PLACE 2.5 MILLILITERS INTO THE FEEDING TUBE DAILY Patient taking differently: Place 20  mg into feeding tube daily before breakfast. 02/05/24   [name], MD  gabapentin  (NEURONTIN ) 300 MG capsule Place 300 mg into feeding tube 2 (two) times daily. Feeding tube 01/19/18   [provider]  hydrocortisone  (ANUSOL -HC) 2.5 % rectal cream Place 1 Application rectally daily as needed for hemorrhoids or anal itching. 04/29/23   [provider]  isosorbide -hydrALAZINE  (BIDIL ) 20-37.5 MG tablet TAKE 1 TABLET BY MOUTH IN THE MORNING AND AT BEDTIME Patient taking differently: Place 1 tablet into feeding tube in the morning and at bedtime. 01/04/24   [name], [name], DO  lidocaine -prilocaine  (EMLA ) cream Apply 1 Application topically as needed (as directed). 02/08/24   Barbarann Nest, MD  magic mouthwash (nystatin , lidocaine , diphenhydrAMINE , alum & mag hydroxide) suspension Swish and Swallow 5 mLs by mouth 4 (four) times daily as needed for mouth pain. Suspension contains equal amounts of Maalox Extra Strength, nystatin , diphenhydramine  and lidocaine . 02/15/24   Hanford Powell BRAVO, NP  morphine  20 MG/5ML solution Take 2.5-5 mLs (10-20 mg total) by mouth every 4 (four) hours as needed for pain. 03/01/24   [name], MD  Nutritional Supplements (NUTREN 1.5) LIQD Nutren 1.5 - give 2 cartons TID (1500 ml/day). Flush tube with 60 ml water  before and after each bolus + additional 200 ml free water  QID. Provides 2250 kcal, 102 g, 1146 ml free water  (2306 ml total water ). Meets >85% of needs. 01/19/24   [name], MD  ondansetron  (ZOFRAN ) 8 MG tablet Take 1 tablet (8 mg total) by mouth every 8 (eight) hours as needed for nausea or vomiting. Patient taking differently: Place 8 mg into feeding tube every 8 (eight) hours as needed for nausea or vomiting. 11/30/23   [name], MD  [facility] VERIO test strip USE TO CHECK BLOOD SUGAR TWICE DAILY 07/20/19   [provider]  polyethylene glycol (MIRALAX  / GLYCOLAX ) 17 g packet Take 17 g by mouth daily. Patient taking differently: Place 17 g into  feeding tube daily as needed for mild constipation or moderate constipation. 04/19/22   [name], DO  prochlorperazine  (COMPAZINE ) 10 MG tablet Take 1 tablet (10 mg total) by mouth every 6 (six) hours as needed for nausea or vomiting. Patient taking differently: Place 10 mg into feeding tube every 6 (six) hours as needed for nausea or vomiting. 11/30/23   [name], MD  Protein (FEEDING SUPPLEMENT, PROSOURCE TF20,) liquid Place 60 mLs into feeding tube daily. 02/15/24   Boscia, Heather E, NP  psyllium (HYDROCIL/METAMUCIL) 95 % PACK Take 1 packet by mouth daily. Patient taking differently: Take 1 packet by mouth daily as needed for mild constipation or moderate constipation. 04/15/22   [name], DO  Water  For Irrigation, Sterile (FREE WATER ) SOLN Place 100 mLs into feeding tube every 6 (six) hours. 06/17/23   [name], MD      Allergies  Azilsartan and Advil [ibuprofen]    Review of Systems   Review of Systems A 10 point review of systems was performed and is negative unless otherwise reported in HPI.  Physical Exam Updated Vital Signs BP 124/89   Pulse (!) 106   Temp 97.9 F (36.6 C)   Resp (!) 36   SpO2 97%  Physical Exam General: Normal appearing {Desc; male/male:11659}, lying in bed.  HEENT: PERRLA, Sclera anicteric, MMM, trachea midline.  Cardiology: RRR, no murmurs/rubs/gallops. BL radial and DP pulses equal bilaterally.  Resp: Normal respiratory rate and effort. CTAB, no wheezes, rhonchi, crackles.  Abd: Soft, non-tender, non-distended. No rebound tenderness or guarding.  GU: Deferred. MSK: No peripheral edema or signs of trauma. Extremities without deformity or TTP. No cyanosis or clubbing. Skin: warm, dry. No rashes or lesions. Back: No CVA tenderness Neuro: A&Ox4, CNs II-XII grossly intact. MAEs. Sensation grossly intact.  Psych: Normal mood and affect.   ED Results / Procedures / Treatments   Labs (all labs ordered are listed, but only abnormal  results are displayed) Labs Reviewed  RESP PANEL BY RT-PCR (RSV, FLU A&B, COVID)  [id number]  CBC WITH DIFFERENTIAL/PLATELET  COMPREHENSIVE METABOLIC PANEL WITH GFR  PRO BRAIN NATRIURETIC PEPTIDE  TROPONIN T, HIGH SENSITIVITY    EKG EKG Interpretation Date/Time:  Saturday March 05 2024 10:53:54 EST Ventricular Rate:  96 PR Interval:  174 QRS Duration:  157 QT Interval:  386 QTC Calculation: 488 R Axis:   -88  Text Interpretation: Sinus rhythm RBBB and LAFB Similar to prior EKGs Confirmed by Franklyn Gills [phone]) on 03/05/2024 11:04:57 AM  Radiology No results found.  Procedures Procedures  {Document cardiac monitor, telemetry assessment procedure when appropriate:1}  Medications Ordered in ED Medications - No data to display  ED Course/ Medical Decision Making/ A&P                          Medical Decision Making Amount and/or Complexity of Data Reviewed Labs: ordered. Radiology: ordered.    This patient presents to the ED for concern of ***, this involves an extensive number of treatment options, and is a complaint that carries with it a high risk of complications and morbidity.  I considered the following differential and admission for this acute, potentially life threatening condition.   MDM:    ***     Labs: I Ordered, and personally interpreted labs.  The pertinent results include:  ***  Imaging Studies ordered: I ordered imaging studies including *** I independently visualized and interpreted imaging. I agree with the radiologist interpretation  Additional history obtained from ***.  External records from outside source obtained and reviewed including ***  Cardiac Monitoring: The patient was maintained on a cardiac monitor.  I personally viewed and interpreted the cardiac monitored which showed an underlying rhythm of: ***  Reevaluation: After the interventions noted above, I reevaluated the patient and found that they have  :{resolved/improved/worsened:23923::improved}  Social Determinants of Health: ***  Disposition:  ***  Co morbidities that complicate the patient evaluation  Past Medical History:  Diagnosis Date   Angioedema 09/17/2011   tongue and lips   Arthritis    both of my legs and feet (01/05/2014)   CHF (congestive heart failure) (HCC)    Diabetes mellitus without complication (HCC)    type 2   DVT (deep venous thrombosis) (HCC) 06/16/2023   LLE   Dysphagia    Erectile dysfunction 04/20/2018   Esophageal cancer (HCC)  GI bleed    Heart murmur 04/20/2018   History of radiation therapy    06/24/2023- 08/03/2023 Dr. 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   I have reviewed the patients home medicines and have made adjustments as needed  Problem List / ED Course: Problem List Items Addressed This Visit   None        {Document critical care time when appropriate:1} {Document review of labs and clinical decision tools ie heart score, Chads2Vasc2 etc:1}  {Document your independent review of radiology images, and any outside records:1} {Document your discussion with family members, caretakers, and with consultants:1} {Document social determinants of health affecting pt's care:1} {Document your decision making why or why not admission, treatments were needed:1}  This note was created using dictation software, which may contain spelling or grammatical errors.  "

## 2024-03-05 NOTE — H&P (Signed)
 " History and Physical  Melvin Hudson FMW:[id number] DOB: January 27, 1956 DOA: 03/05/2024  PCP: Haze Kingfisher, MD   Chief Complaint: Dyspnea, wheezing  HPI: Melvin Hudson is a 69 y.o. male with medical history significant for HTN, DVT on Eliquis , HLD, anxiety, chronic back pain, chronic diastolic HF, esophageal cancer s/p radiation currently on chemotherapy who was sent from the cancer center for evaluation of dyspnea and wheezing. Per sister, patient has had increased wheezing and shortness of breath over the last 2 to 3 weeks. Patient reports he lives alone and has baseline dyspnea on exertion and so does not move around much. He does not take anything p.o. due to dry mouth, he usually wets his mouth with water  before spitting it out.  He denies any worsening shortness of breath from his baseline but does report increased wheezes over the last 2 weeks.  Patient was seen at the cancer center for removal of her pump after recent chemotherapy and upon arrival, patient was noted to have audible wheezing but stable O2 sats of 97% on room air.  He was sent directly to the ER for further evaluation.  Patient denies any chest pain, palpitations, leg swelling, cough, nausea, vomiting or abdominal pain.  ED Course: Initial vitals show afebrile, RR 16-23, HR 80-90s, SBP 110-120s, SpO2 97% on 2 L [place]. Initial labs significant for unremarkable CBC, glucose 182, normal renal function, proBNP 434) normal for age), normal troponin, negative flu/RSV/COVID test. EKG shows sinus rhythm with LAFB and RBBB.  CTA chest PE study negative for PE but shows retained bubbly secretions within the proximal right mainstem bronchus which may represent aspiration or retained secretions with background emphysema and diffuse bronchial wall thickening. Pt received DuoNeb x 1. TR[facility] was consulted for admission.   Review of Systems: Please see HPI for pertinent positives and negatives. A complete 10 system review of systems are otherwise  negative.  Past Medical History:  Diagnosis Date   Angioedema 09/17/2011   tongue and lips   Arthritis    both of my legs and feet (01/05/2014)   CHF (congestive heart failure) (HCC)    Diabetes mellitus without complication (HCC)    type 2   DVT (deep venous thrombosis) (HCC) 06/16/2023   LLE   Dysphagia    Erectile dysfunction 04/20/2018   Esophageal cancer (HCC)    GI bleed    Heart murmur 04/20/2018   History of radiation therapy    06/24/2023- 08/03/2023 Dr. 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 ENDOSCOPY;  Service: Endoscopy;  Laterality: N/A;   COLONOSCOPY W/ BIOPSIES AND POLYPECTOMY     benign   DIRECT LARYNGOSCOPY  01/05/2014   DIRECT LARYNGOSCOPY N/A 01/05/2014   Procedure: DIRECT LARYNGOSCOPY AND BIOPSY;  Surgeon: Ida Loader, MD;  Location: [facility] OR;  Service: ENT;  Laterality: N/A;   ESOPHAGEAL DILATION N/A 01/01/2024   Procedure: DILATION, ESOPHAGUS;  Surgeon: Loader Ida, MD;  Location: [facility] OR;  Service: ENT;  Laterality: N/A;   ESOPHAGOSCOPY WITH DILITATION N/A 05/27/2023   Procedure: ESOPHAGOSCOPY;  Surgeon: [name], MD;  Location: [facility] OR;  Service: ENT;  Laterality: N/A;   ESOPHAGOSCOPY WITH DILITATION N/A 10/30/2023   Procedure: ESOPHAGOSCOPY, WITH BIOPSY;  Surgeon: [name], MD;  Location: [facility] OR;  Service: ENT;  Laterality: N/A;  With possible biopsy   IR GASTROSTOMY TUBE MOD SED  06/12/2023   IR IMAGING GUIDED PORT INSERTION  11/24/2023    IR RADIOLOGIST EVAL & MGMT  08/10/2023   IR RADIOLOGIST EVAL & MGMT  08/14/2023   KNEE ARTHROSCOPY Right ~ 1994   LEFT AND RIGHT HEART CATHETERIZATION WITH CORONARY/GRAFT ANGIOGRAM N/A 12/31/2010   Procedure: LEFT AND RIGHT HEART CATHETERIZATION WITH EL BILE;  Surgeon: Erick JONELLE Bergamo, MD;  Location: [facility] CATH LAB;  Service: Cardiovascular;  Laterality: N/A;   MULTIPLE EXTRACTIONS WITH ALVEOLOPLASTY N/A 07/08/2013   Procedure: Extraction of tooth #'s 22, 27 with alveoloplasty and bilateal mandibular facial exostoses reductions;  Surgeon: Tanda JULIANNA Fanny, DDS;  Location: [facility] ORS;  Service: Oral Surgery;  Laterality: N/A;   MULTIPLE TOOTH EXTRACTIONS  ~ 2008   Social History:  reports that he quit smoking about 10 years ago. His smoking use included cigarettes and cigars. He started smoking about 52 years ago. He has a 84 pack-year smoking history. He has never used smokeless tobacco. He reports that he does not drink alcohol  and does not use drugs.  Allergies[1]  Family History  Problem Relation Age of Onset   Cancer Mother    Stroke Mother    Hypertension Mother    Diabetes Father    Aneurysm Father    Cancer Sister        breast ca     Prior to Admission medications  Medication Sig Start Date End Date Taking? Authorizing Provider  albuterol  (VENTOLIN  HFA) 108 (90 Base) MCG/ACT inhaler Inhale 2 puffs into the lungs every 4 (four) hours as needed for wheezing (or coughing). 04/02/23   [provider]  allopurinol  (ZYLOPRIM ) 300 MG tablet Place 300 mg into feeding tube daily as needed (for gout flares).    [provider]  apixaban  (ELIQUIS ) 5 MG TABS tablet Take 1 tablet (5 mg total) by mouth 2 (two) times daily. 02/23/24   Hanford Powell BRAVO, NP  atorvastatin  (LIPITOR) 40 MG tablet Take 1 tablet (40 mg total) by mouth at bedtime. Patient taking differently: Place 40 mg into feeding tube at bedtime. 10/07/19 02/24/24  [name], [name], DO  bismuth subsalicylate  (PEPTO BISMOL) 262 MG/15ML suspension Place 30 mLs into feeding tube every 6 (six) hours as needed for indigestion or diarrhea or loose stools.    [provider]  Blood Glucose Monitoring Suppl (ONETOUCH VERIO FLEX SYSTEM) w/Device KIT USE TO CHECK BLOOD SUGAR TWICE DAILY 04/21/19   [provider]  calcium  carbonate (TUMS - DOSED IN MG ELEMENTAL CALCIUM ) 500 MG chewable tablet Chew 2 tablets (400 mg of elemental calcium  total) by mouth 3 (three) times daily with meals. Patient taking differently: Place 400 mg of elemental calcium  into feeding tube daily as needed for indigestion or heartburn. 04/18/22   [name], DO  carvedilol  (COREG ) 3.125 MG tablet Place 3.125 mg into feeding tube 2 (two) times daily with a meal.    [provider]  cetirizine (ZYRTEC) 10 MG tablet Place 10 mg into feeding tube daily as needed for allergies or rhinitis.    [provider]  clonazePAM  (KLONOPIN ) 1 MG disintegrating tablet  Place 1 mg into feeding tube daily as needed (for anxiety). 03/10/22   [provider]  dapagliflozin  propanediol (FARXIGA ) 10 MG TABS tablet 10 mg See admin instructions. 10 mg, per tube, once a day    [provider]  diphenhydrAMINE  (BENADRYL ) 25 MG tablet Take 1 tablet (25 mg total) by mouth every 6 (six) hours. Patient taking differently: Take 25 mg by mouth every 6 (six) hours as needed for itching. 10/29/13   [name], MD  doxycycline  (VIBRA -TABS) 100 MG tablet Take 1 tablet (100 mg total) by mouth 2 (two) times daily. 02/23/24   Boscia, Heather E, NP  famotidine  (PEPCID ) 40 MG/5ML suspension PLACE 2.5 MILLILITERS INTO THE FEEDING TUBE DAILY Patient taking differently: Place 20 mg into feeding tube daily before breakfast. 02/05/24   [name], MD  gabapentin  (NEURONTIN ) 300 MG capsule Place 300 mg into feeding tube 2 (two) times daily. Feeding tube 01/19/18   [provider]  hydrocortisone  (ANUSOL -HC) 2.5 % rectal cream Place 1  Application rectally daily as needed for hemorrhoids or anal itching. 04/29/23   [provider]  isosorbide -hydrALAZINE  (BIDIL ) 20-37.5 MG tablet TAKE 1 TABLET BY MOUTH IN THE MORNING AND AT BEDTIME Patient taking differently: Place 1 tablet into feeding tube in the morning and at bedtime. 01/04/24   [name], [name], DO  lidocaine -prilocaine  (EMLA ) cream Apply 1 Application topically as needed (as directed). 02/08/24   Barbarann Nest, MD  magic mouthwash (nystatin , lidocaine , diphenhydrAMINE , alum & mag hydroxide) suspension Swish and Swallow 5 mLs by mouth 4 (four) times daily as needed for mouth pain. Suspension contains equal amounts of Maalox Extra Strength, nystatin , diphenhydramine  and lidocaine . 02/15/24   Hanford Powell BRAVO, NP  morphine  20 MG/5ML solution Take 2.5-5 mLs (10-20 mg total) by mouth every 4 (four) hours as needed for pain. 03/01/24   [name], MD  Nutritional Supplements (NUTREN 1.5) LIQD Nutren 1.5 - give 2 cartons TID (1500 ml/day). Flush tube with 60 ml water  before and after each bolus + additional 200 ml free water  QID. Provides 2250 kcal, 102 g, 1146 ml free water  (2306 ml total water ). Meets >85% of needs. 01/19/24   [name], MD  ondansetron  (ZOFRAN ) 8 MG tablet Take 1 tablet (8 mg total) by mouth every 8 (eight) hours as needed for nausea or vomiting. Patient taking differently: Place 8 mg into feeding tube every 8 (eight) hours as needed for nausea or vomiting. 11/30/23   [name], MD  [facility] VERIO test strip USE TO CHECK BLOOD SUGAR TWICE DAILY 07/20/19   [provider]  polyethylene glycol (MIRALAX  / GLYCOLAX ) 17 g packet Take 17 g by mouth daily. Patient taking differently: Place 17 g into feeding tube daily as needed for mild constipation or moderate constipation. 04/19/22   [name], DO  prochlorperazine  (COMPAZINE ) 10 MG tablet Take 1 tablet (10 mg total) by mouth every 6 (six) hours as needed for nausea or vomiting. Patient taking differently:  Place 10 mg into feeding tube every 6 (six) hours as needed for nausea or vomiting. 11/30/23   [name], MD  Protein (FEEDING SUPPLEMENT, PROSOURCE TF20,) liquid Place 60 mLs into feeding tube daily. 02/15/24   Boscia, Heather E, NP  psyllium (HYDROCIL/METAMUCIL) 95 % PACK Take 1 packet by mouth daily. Patient taking differently: Take 1 packet by mouth daily as needed for mild constipation or moderate constipation. 04/15/22   [name], DO  Water  For Irrigation, Sterile (FREE WATER ) SOLN Place 100 mLs into feeding  tube every 6 (six) hours. 06/17/23   [name], MD    Physical Exam: BP 124/82 (BP Location: Left Arm)   Pulse 78   Temp 98.6 F (37 C) (Oral)   Resp 20   SpO2 95%  General: Pleasant, chronically ill elderly man laying in bed. No acute distress. HEENT: [place]/AT. Anicteric sclera Chest: Right chest wall port stable in place. CV: RRR. No murmurs, rubs, or gallops. No LE edema Pulmonary: On 2 L [place]. Lungs CTAB. Normal effort. No increased work of breathing. Mild wheezes but mostly transmitted upper airway sounds.  Abdominal: Soft, nontender, nondistended. G-tube stable in place. Normal bowel sounds. Extremities: Palpable radial and DP pulses. Normal ROM. Skin: Warm and dry. No obvious rash or lesions. Neuro: A&Ox3. Moves all extremities. Normal sensation to light touch. No focal deficit. Psych: Normal mood and affect          Labs on Admission:  Basic Metabolic Panel: Recent Labs  Lab 03/01/24 0912 03/05/24 1135  NA 142 143  K 4.2 3.8  CL 102 103  CO2 31 30  GLUCOSE 97 182*  BUN 22 26*  CREATININE 0.69 0.70  CALCIUM  9.3 9.1   Liver Function Tests: Recent Labs  Lab 03/01/24 0912 03/05/24 1135  AST 22 24  ALT 28 20  ALKPHOS 147* 129*  BILITOT 0.7 0.8  PROT 8.0 7.6  ALBUMIN 3.7 3.6   No results for input(s): LIPASE, AMYLASE in the last 168 hours. No results for input(s): AMMONIA in the last 168 hours. CBC: Recent Labs  Lab 03/01/24 0912  03/05/24 1135  WBC 7.8 8.6  NEUTROABS 5.7 7.5  HGB 11.2* 11.4*  HCT 33.7* 35.5*  MCV 80.6 83.7  PLT 303 317   Cardiac Enzymes: No results for input(s): CKTOTAL, CKMB, CKMBINDEX, TROPONINI in the last 168 hours. BNP (last 3 results) No results for input(s): BNP in the last 8760 hours.  ProBNP (last 3 results) Recent Labs    03/05/24 1135  PROBNP 434.0*    CBG: No results for input(s): GLUCAP in the last 168 hours.  Radiological Exams on Admission: CT Angio Chest PE W and/or Wo Contrast Result Date: 03/05/2024 EXAM: CTA CHEST 03/05/2024 12:54:05 PM TECHNIQUE: CTA of the chest was performed with and without the administration of 75 mL of iohexol  (OMNIPAQUE ) 350 MG/ML injection. Multiplanar reformatted images are provided for review. MIP images are provided for review. Automated exposure control, iterative reconstruction, and/or weight based adjustment of the mA/kV was utilized to reduce the radiation dose to as low as reasonably achievable. COMPARISON: 01/21/2023 CLINICAL HISTORY: Esophageal carcinoma. Patient presents with wheezing/stridor. Rule out pulmonary embolism. FINDINGS: PULMONARY ARTERIES: Pulmonary arteries are adequately opacified for evaluation. No acute pulmonary embolus. Main pulmonary artery is normal in caliber. MEDIASTINUM: The heart demonstrates coronary artery calcifications. The pericardium demonstrates no acute abnormality. There is aortic atherosclerosis. Esophageal mass extends from the level of the thoracic inlet to below the carina. Measures 4.5 x 3.3 x 12.2 cm. The appearance is similar to the PET/CT 01/21/2024 with debris and gas noted in the proximal esophagus as before. LYMPH NODES: No enlarged mediastinal, hilar or axillary lymph nodes. LUNGS AND PLEURA: There are retained bubbly secretions within the proximal right mainstem bronchus which may reflect aspirated material or retained secretions. Emphysema with diffuse bronchial wall thickening. No focal  consolidation or pulmonary edema. No interstitial edema or consolidative change. No pleural fluid. No pneumothorax. UPPER ABDOMEN: Percutaneous gastrostomy tube is identified within the upper abdomen without complications. SOFT TISSUES  AND BONES: No acute bone or soft tissue abnormality. IMPRESSION: 1. No evidence of pulmonary embolism. 2. Retained bubbly secretions within the proximal right mainstem bronchus, which may represent aspiration or retained secretions, with background emphysema and diffuse bronchial wall thickening. Recommend aspiration precautions and consideration of swallow evaluation. 3. Long-segment esophageal mass extending from the thoracic inlet to below the carina, measuring 4.5 x 3.3 x 12.2 cm, most consistent with known esophageal malignancy. Electronically signed by: Waddell Calk MD 03/05/2024 01:27 PM EST RP Workstation: [id number]   Assessment/Plan Melvin Hudson is a 69 y.o. male with medical history significant for HTN, DVT on Eliquis , HLD, anxiety, chronic back pain, chronic diastolic HF, esophageal cancer s/p radiation currently on chemotherapy who was sent from the cancer center for evaluation of dyspnea and wheezing and admitted for acute hypoxic respiratory failure.  # Acute hypoxic respiratory failure - Patient presented to the ED due to labored breathing and audible wheezing with mild shortness of breath - Patient became dyspneic with SpO2 in the mid 80s while speaking on room air, placed on 2 L [place] - Dyspnea and wheezing likely due to underlying esophageal cancer and emphysema with retained secretions in the proximal right middle mainstem bronchus - Continue supplemental O2, check pulse ox with ambulation tomorrow  # Emphysema - Former smoker with recent increase wheezes and shortness of breath with exertion - CT chest admission shows evidence of background of eczema and diffuse bronchial wall thickening - Patient in no respiratory distress but continues to have some  wheezes and upper airway transmitted sounds - Start IV Solu-Medrol  80 mg daily - As needed DuoNebs  # Hx of esophageal cancer - Patient with history of malignant neoplasm of the upper third of the esophagus status post radiation therapy currently on chemotherapy - Follow-up with medical oncology (Dr. Lanny) in the outpatient  # S/p G-tube placement # Dysphagia - Patient with significant dysphagia due to esophageal cancer, status post G-tube placement - Reports he does his tube feeds 4 times daily, resume tube feeds - Registered dietitian consulted, appreciate assistance with tube feeds  # Chronic diastolic HF - Patient euvolemic on exam - Continue Coreg , BiDil  and Farxiga   # HTN - BP stable with SBP in the 110-120 - Continue BiDil  and Coreg   # HLD - Continue atorvastatin   # Hx of DVT - Continue Eliquis   # Chronic back pain - Reported upper back pain secondary to esophageal cancer - Continue gabapentin  - IV Dilaudid  as needed for pain  # GERD - Continue Pepcid   # OSA - Continue CPAP at bedtime  DVT prophylaxis: Eliquis     Code Status: Full Code  Consults called: None  Family Communication: Discussed findings/results and plan for admission with sister at bedside  Severity of Illness: The appropriate patient status for this patient is OBSERVATION. Observation status is judged to be reasonable and necessary in order to provide the required intensity of service to ensure the patient's safety. The patient's presenting symptoms, physical exam findings, and initial radiographic and laboratory data in the context of their medical condition is felt to place them at decreased risk for further clinical deterioration. Furthermore, it is anticipated that the patient will be medically stable for discharge from the hospital within 2 midnights of admission.   Level of care: Med-Surg   I personally spent a total of 75 minutes in the care of the patient today including preparing to  see the patient, getting/reviewing separately obtained history, performing a medically appropriate exam/evaluation, placing orders, documenting  clinical information in the EHR, and communicating results.  Melvin Claretta HERO, MD 03/05/2024, 6:40 PM [facility] Pager: [phone] Isaiah [phone]   If 7PM-7AM, please contact night-coverage [email or web address] Password [email or web address]    [1]  Allergies Allergen Reactions   Azilsartan Itching and Other (See Comments)    Avoid ARB and ACEI, -per MD   Advil [Ibuprofen] Anxiety and Other (See Comments)    Felt jittery   "

## 2024-03-05 NOTE — ED Notes (Signed)
 Pt family reports pt breathing patterns, gasping for air when inhaling, have been present for about 2 weeks now. Pt was seen on the 13 and 15 and providers report pt breathing was a result of his cancer. Pt wears no O2 at home, SpO2 96% on room air, pt placed on 2 L  O2 for comfort.

## 2024-03-05 NOTE — Progress Notes (Signed)
 Patient was here in infusion suite for pump d/c. Upon arrival patient, audible wheezing heard as patient was in wheelchair on room air. O2 sat 97% on room air. No acute respiratory distress at that time but labored breathing noted along with difficulty speaking in sentences. Patient also had inhaler in his hand upon arrival. When patient and caregiver were asked about breathing, patient stated that it was because of his cancer. Patient's sister was concerned due to the nature of his breathing and stated that she would be more comfortable if patient was examined at emergency department. Patient was agreeable to this. Patient was already discharged from [facility] but this nurse escorted patient and sister to emergency department per sister's request due to her concerns and also not knowing how to get to ED. Arrived to registration desk. Patient received armband and taken back immediately to treatment room with ED staff.

## 2024-03-06 ENCOUNTER — Other Ambulatory Visit (HOSPITAL_COMMUNITY): Payer: Self-pay

## 2024-03-06 ENCOUNTER — Encounter: Payer: Self-pay | Admitting: Hematology

## 2024-03-06 DIAGNOSIS — J439 Emphysema, unspecified: Secondary | ICD-10-CM | POA: Diagnosis not present

## 2024-03-06 DIAGNOSIS — J9601 Acute respiratory failure with hypoxia: Secondary | ICD-10-CM | POA: Diagnosis not present

## 2024-03-06 DIAGNOSIS — I5032 Chronic diastolic (congestive) heart failure: Secondary | ICD-10-CM | POA: Diagnosis not present

## 2024-03-06 DIAGNOSIS — Z86718 Personal history of other venous thrombosis and embolism: Secondary | ICD-10-CM | POA: Diagnosis not present

## 2024-03-06 DIAGNOSIS — I10 Essential (primary) hypertension: Secondary | ICD-10-CM | POA: Diagnosis not present

## 2024-03-06 DIAGNOSIS — Z931 Gastrostomy status: Secondary | ICD-10-CM | POA: Diagnosis not present

## 2024-03-06 DIAGNOSIS — Z8501 Personal history of malignant neoplasm of esophagus: Secondary | ICD-10-CM | POA: Diagnosis not present

## 2024-03-06 LAB — BASIC METABOLIC PANEL WITH GFR
Anion gap: 5 (ref 5–15)
BUN: 23 mg/dL (ref 8–23)
CO2: 32 mmol/L (ref 22–32)
Calcium: 9 mg/dL (ref 8.9–10.3)
Chloride: 104 mmol/L (ref 98–111)
Creatinine, Ser: 0.64 mg/dL (ref 0.61–1.24)
GFR, Estimated: >60 mL/min
Glucose, Bld: 165 mg/dL — ABNORMAL HIGH (ref 70–99)
Potassium: 4 mmol/L (ref 3.5–5.1)
Sodium: 141 mmol/L (ref 135–145)

## 2024-03-06 LAB — CBC
HCT: 33 % — ABNORMAL LOW (ref 39.0–52.0)
Hemoglobin: 10.7 g/dL — ABNORMAL LOW (ref 13.0–17.0)
MCH: 27 pg (ref 26.0–34.0)
MCHC: 32.4 g/dL (ref 30.0–36.0)
MCV: 83.3 fL (ref 80.0–100.0)
Platelets: 307 K/uL (ref 150–400)
RBC: 3.96 MIL/uL — ABNORMAL LOW (ref 4.22–5.81)
RDW: 19.4 % — ABNORMAL HIGH (ref 11.5–15.5)
WBC: 7.2 K/uL (ref 4.0–10.5)
nRBC: 0 % (ref 0.0–0.2)

## 2024-03-06 MED ORDER — HEPARIN SOD (PORK) LOCK FLUSH 100 UNIT/ML IV SOLN
500.0000 [IU] | INTRAVENOUS | Status: AC | PRN
  Administered 2024-03-06: 500 [IU]
  Filled 2024-03-06: qty 5

## 2024-03-06 MED ORDER — HEPARIN NA (PORK) LOCK FLSH PF 10 UNIT/ML IV SOLN
10.0000 [IU] | Freq: Once | INTRAVENOUS | Status: DC
  Filled 2024-03-06: qty 5

## 2024-03-06 MED ORDER — PROSOURCE TF20 ENFIT COMPATIBL EN LIQD
60.0000 mL | Freq: Two times a day (BID) | ENTERAL | Status: DC
  Filled 2024-03-06: qty 60

## 2024-03-06 MED ORDER — PREDNISONE 50 MG PO TABS
50.0000 mg | ORAL_TABLET | Freq: Every day | ORAL | 0 refills | Status: AC
  Filled 2024-03-06: qty 4, 4d supply, fill #0

## 2024-03-06 MED ORDER — FREE WATER: 60.0000 mL | Freq: Every day | Status: DC

## 2024-03-06 MED ORDER — OSMOLITE 1.5 CAL PO LIQD
474.0000 mL | Freq: Three times a day (TID) | ORAL | Status: DC
  Filled 2024-03-06 (×2): qty 474

## 2024-03-06 MED ORDER — IPRATROPIUM-ALBUTEROL 0.5-2.5 (3) MG/3ML IN SOLN
3.0000 mL | Freq: Four times a day (QID) | RESPIRATORY_TRACT | 2 refills | Status: AC | PRN
  Filled 2024-03-06: qty 360, 30d supply, fill #0

## 2024-03-06 MED ORDER — GUAIFENESIN 100 MG/5ML PO LIQD
15.0000 mL | ORAL | 0 refills | Status: AC | PRN
  Filled 2024-03-06: qty 300, 4d supply, fill #0

## 2024-03-06 NOTE — Discharge Summary (Signed)
 " Physician Discharge Summary   Patient: Melvin Hudson MRN: [id number] DOB: 06-Jul-1955  Admit date:     03/05/2024  Discharge date: 03/06/24  Discharge Physician: Amaryllis Dare   PCP: Haze Kingfisher, MD   Recommendations at discharge:  Please encourage clearing of his throat to prevent mucus buildup. Please ensure completion of few more days of steroid for concern of COPD exacerbation. Patient can use Mucinex  through tube as needed. Follow-up with primary care provider Follow-up with his oncologist  Discharge Diagnoses: Principal Problem:   Acute respiratory failure with hypoxia (HCC) Active Problems:   Essential hypertension   Chronic diastolic CHF (congestive heart failure) (HCC)   Dyspnea   Aspiration into airway   Emphysema lung (HCC)   History of esophageal cancer   History of DVT (deep vein thrombosis)   S/P percutaneous endoscopic gastrostomy (PEG) tube placement [facility])   Hospital Course: Partly taken from H&P.  Melvin Hudson is a 69 y.o. male with medical history significant for HTN, DVT on Eliquis , HLD, anxiety, chronic back pain, chronic diastolic HF, esophageal cancer s/p radiation currently on chemotherapy who was sent from the cancer center for evaluation of dyspnea and wheezing.  Patient was seen at cancer center for removal of his pump after recent chemotherapy and was sent to the OR for significant wheezing.   Vital stable, hypoxic in mid 80s with ambulation requiring 2 L of oxygen.  Labs mostly at baseline.  EKG with LAFB and RBBB. CTA chest was negative for PE, did show her retained bubbly secretions within the proximal right mediastinum bronchus which may represent aspiration or retained secretions with background emphysema and diffuse bronchial wall thickening.  Patient was started on steroid for concern of COPD exacerbation with underlying significant emphysema.  1/18: Vital and labs stable, saturating well on room air , seems like patient need to remind  clearing throat and having some cough to clear out his respiratory passages.  He was given some Mucinex  to help.  He was also given a nebulizer machine to use at home with DuoNeb as needed for wheezing or shortness of breath.  Patient was ambulated without any desaturation or wheezing.  He was also given few more days of prednisone  for COPD exacerbation with history of underlying emphysema.  He will continue with his home medications and G-tube feeding and follow-up with his providers for further assistance.      Pain control - [place]  Controlled Substance Reporting System database was reviewed. and patient was instructed, not to drive, operate heavy machinery, perform activities at heights, swimming or participation in water  activities or provide baby-sitting services while on Pain, Sleep and Anxiety Medications; until their outpatient Physician has advised to do so again. Also recommended to not to take more than prescribed Pain, Sleep and Anxiety Medications.  Consultants: None Procedures performed: None Disposition: Home Diet recommendation:  NPO patient is on G-tube feeding and will continue home feeds DISCHARGE MEDICATION: Allergies as of 03/06/2024       Reactions   Azilsartan Itching, Other (See Comments)   Avoid ARB and ACEI, -per MD   Advil [ibuprofen] Anxiety, Other (See Comments)   Felt jittery        Medication List     TAKE these medications    albuterol  108 (90 Base) MCG/ACT inhaler Commonly known as: VENTOLIN  HFA Inhale 2 puffs into the lungs every 4 (four) hours as needed for wheezing (or coughing).   allopurinol  300 MG tablet Commonly known as: ZYLOPRIM  Place 300 mg into  feeding tube daily as needed (for gout flares).   apixaban  5 MG Tabs tablet Commonly known as: Eliquis  Take 1 tablet (5 mg total) by mouth 2 (two) times daily.   atorvastatin  40 MG tablet Commonly known as: LIPITOR Take 1 tablet (40 mg total) by mouth at bedtime. What changed:  how to take this   bismuth subsalicylate 262 MG/15ML suspension Commonly known as: PEPTO BISMOL Place 30 mLs into feeding tube every 6 (six) hours as needed for indigestion or diarrhea or loose stools.   calcium  carbonate 500 MG chewable tablet Commonly known as: TUMS - dosed in mg elemental calcium  Chew 2 tablets (400 mg of elemental calcium  total) by mouth 3 (three) times daily with meals. What changed:  how to take this when to take this reasons to take this   carvedilol  3.125 MG tablet Commonly known as: COREG  Place 3.125 mg into feeding tube 2 (two) times daily with a meal.   cetirizine 10 MG tablet Commonly known as: ZYRTEC Place 10 mg into feeding tube daily as needed for allergies or rhinitis.   clonazePAM  1 MG disintegrating tablet Commonly known as: KLONOPIN  Place 1 mg into feeding tube daily as needed (for anxiety).   diphenhydrAMINE  25 MG tablet Commonly known as: BENADRYL  Take 1 tablet (25 mg total) by mouth every 6 (six) hours. What changed:  when to take this reasons to take this   doxycycline  100 MG tablet Commonly known as: VIBRA -TABS Take 1 tablet (100 mg total) by mouth 2 (two) times daily.   famotidine  40 MG/5ML suspension Commonly known as: PEPCID  PLACE 2.5 MILLILITERS INTO THE FEEDING TUBE DAILY What changed: See the new instructions.   Farxiga  10 MG Tabs tablet Generic drug: dapagliflozin  propanediol 10 mg See admin instructions. 10 mg, per tube, once a day   feeding supplement (PROSource TF20) liquid Place 60 mLs into feeding tube daily.   free water  Soln Place 100 mLs into feeding tube every 6 (six) hours.   gabapentin  300 MG capsule Commonly known as: NEURONTIN  Place 300 mg into feeding tube 2 (two) times daily. Feeding tube   guaiFENesin  100 MG/5ML liquid Commonly known as: ROBITUSSIN Place 15 mLs into feeding tube every 4 (four) hours as needed for cough or to loosen phlegm.   hydrocortisone  2.5 % rectal cream Commonly known as:  ANUSOL -HC Place 1 Application rectally daily as needed for hemorrhoids or anal itching.   ipratropium-albuterol  0.5-2.5 (3) MG/3ML Soln Commonly known as: DUONEB Take 3 mLs by nebulization every 6 (six) hours as needed.   isosorbide -hydrALAZINE  20-37.5 MG tablet Commonly known as: BIDIL  TAKE 1 TABLET BY MOUTH IN THE MORNING AND AT BEDTIME What changed: how to take this   lidocaine -prilocaine  cream Commonly known as: EMLA  Apply 1 Application topically as needed (as directed).   magic mouthwash (nystatin , lidocaine , diphenhydrAMINE , alum & mag hydroxide) suspension Swish and Swallow 5 mLs by mouth 4 (four) times daily as needed for mouth pain. Suspension contains equal amounts of Maalox Extra Strength, nystatin , diphenhydramine  and lidocaine .   morphine  20 MG/5ML solution Take 2.5-5 mLs (10-20 mg total) by mouth every 4 (four) hours as needed for pain.   Nutren 1.5 Liqd Nutren 1.5 - give 2 cartons TID (1500 ml/day). Flush tube with 60 ml water  before and after each bolus + additional 200 ml free water  QID. Provides 2250 kcal, 102 g, 1146 ml free water  (2306 ml total water ). Meets >85% of needs.   ondansetron  8 MG tablet Commonly known as: ZOFRAN  Take 1  tablet (8 mg total) by mouth every 8 (eight) hours as needed for nausea or vomiting. What changed: how to take this   OneTouch Verio Flex System w/Device Kit USE TO CHECK BLOOD SUGAR TWICE DAILY   OneTouch Verio test strip Generic drug: glucose blood USE TO CHECK BLOOD SUGAR TWICE DAILY   polyethylene glycol 17 g packet Commonly known as: MIRALAX  / GLYCOLAX  Take 17 g by mouth daily. What changed:  how to take this when to take this reasons to take this   predniSONE  50 MG tablet Commonly known as: DELTASONE  Place 1 tablet (50 mg total) into feeding tube daily with breakfast for 4 days.   prochlorperazine  10 MG tablet Commonly known as: COMPAZINE  Take 1 tablet (10 mg total) by mouth every 6 (six) hours as needed for  nausea or vomiting. What changed: how to take this   psyllium 95 % Pack Commonly known as: HYDROCIL/METAMUCIL Take 1 packet by mouth daily. What changed:  when to take this reasons to take this               Durable Medical Equipment  (From admission, onward)           Start     Ordered   03/06/24 1206  For home use only DME Nebulizer machine  Once       Question Answer Comment  Patient needs a nebulizer to treat with the following condition COPD exacerbation (HCC)   Length of Need Lifetime   Additional equipment included Administration kit   Additional equipment included Filter      03/06/24 1206            Discharge Exam: Filed Weights   03/05/24 1836  Weight: 104.1 kg   General.  Frail elderly man, in no acute distress. Pulmonary.  Lungs clear bilaterally, normal respiratory effort. CV.  Regular rate and rhythm, no JVD, rub or murmur. Abdomen.  Soft, nontender, nondistended, BS positive. CNS.  Alert and oriented .  No focal neurologic deficit. Extremities.  No edema, pulses intact and symmetrical. Psychiatry.  Judgment and insight appears normal.   Condition at discharge: stable  The results of significant diagnostics from this hospitalization (including imaging, microbiology, ancillary and laboratory) are listed below for reference.   Imaging Studies: CT Angio Chest PE W and/or Wo Contrast Result Date: 03/05/2024 EXAM: CTA CHEST 03/05/2024 12:54:05 PM TECHNIQUE: CTA of the chest was performed with and without the administration of 75 mL of iohexol  (OMNIPAQUE ) 350 MG/ML injection. Multiplanar reformatted images are provided for review. MIP images are provided for review. Automated exposure control, iterative reconstruction, and/or weight based adjustment of the mA/kV was utilized to reduce the radiation dose to as low as reasonably achievable. COMPARISON: 01/21/2023 CLINICAL HISTORY: Esophageal carcinoma. Patient presents with wheezing/stridor. Rule out  pulmonary embolism. FINDINGS: PULMONARY ARTERIES: Pulmonary arteries are adequately opacified for evaluation. No acute pulmonary embolus. Main pulmonary artery is normal in caliber. MEDIASTINUM: The heart demonstrates coronary artery calcifications. The pericardium demonstrates no acute abnormality. There is aortic atherosclerosis. Esophageal mass extends from the level of the thoracic inlet to below the carina. Measures 4.5 x 3.3 x 12.2 cm. The appearance is similar to the PET/CT 01/21/2024 with debris and gas noted in the proximal esophagus as before. LYMPH NODES: No enlarged mediastinal, hilar or axillary lymph nodes. LUNGS AND PLEURA: There are retained bubbly secretions within the proximal right mainstem bronchus which may reflect aspirated material or retained secretions. Emphysema with diffuse bronchial wall thickening. No focal  consolidation or pulmonary edema. No interstitial edema or consolidative change. No pleural fluid. No pneumothorax. UPPER ABDOMEN: Percutaneous gastrostomy tube is identified within the upper abdomen without complications. SOFT TISSUES AND BONES: No acute bone or soft tissue abnormality. IMPRESSION: 1. No evidence of pulmonary embolism. 2. Retained bubbly secretions within the proximal right mainstem bronchus, which may represent aspiration or retained secretions, with background emphysema and diffuse bronchial wall thickening. Recommend aspiration precautions and consideration of swallow evaluation. 3. Long-segment esophageal mass extending from the thoracic inlet to below the carina, measuring 4.5 x 3.3 x 12.2 cm, most consistent with known esophageal malignancy. Electronically signed by: Waddell Calk MD 03/05/2024 01:27 PM EST RP Workstation: [id number]   ECHOCARDIOGRAM COMPLETE Result Date: 02/11/2024    ECHOCARDIOGRAM REPORT   Patient Name:   Melvin Hudson Date of Exam: 02/10/2024 Medical Rec #:  [id number]      Height:       74.0 in Accession #:    [id number]     Weight:        245.2 lb Date of Birth:  [birth year]       BSA:          2.370 m Patient Age:    68 years       BP:           133/83 mmHg Patient Gender: M              HR:           86 bpm. Exam Location:  [address] Procedure: 2D Echo, 3D Echo, Cardiac Doppler and Color Doppler (Both Spectral            and Color Flow Doppler were utilized during procedure). Indications:    R07.9 Chest Pain  History:        Patient has prior history of Echocardiogram examinations, most                 recent 03/29/2022. Dilated Aorta, Arrythmias:RBBB; Risk                 Factors:Sleep Apnea, Diabetes and Former Smoker.  Sonographer:    Waldo Guadalajara RCS Referring Phys: [id number] DAYNA N DUNN IMPRESSIONS  1. Left ventricular ejection fraction, by estimation, is 50 to 55%. Left ventricular ejection fraction by 3D volume is 53 %. The left ventricle has low normal function. The left ventricle has no regional wall motion abnormalities. Left ventricular diastolic parameters are consistent with Grade I diastolic dysfunction (impaired relaxation).  2. Right ventricular systolic function is normal. The right ventricular size is normal. There is normal pulmonary artery systolic pressure.  3. The mitral valve is normal in structure. Trivial mitral valve regurgitation. No evidence of mitral stenosis.  4. The aortic valve is calcified. Aortic valve regurgitation is not visualized. No aortic stenosis is present.  5. Aortic dilatation noted. There is borderline dilatation of the ascending aorta, measuring 39 mm.  6. The inferior vena cava is normal in size with greater than 50% respiratory variability, suggesting right atrial pressure of 3 mmHg. FINDINGS  Left Ventricle: Left ventricular ejection fraction, by estimation, is 50 to 55%. Left ventricular ejection fraction by 3D volume is 53 %. The left ventricle has low normal function. The left ventricle has no regional wall motion abnormalities. The left ventricular internal cavity size was normal in size. There  is no left ventricular hypertrophy. Left ventricular diastolic parameters are consistent with Grade I diastolic dysfunction (impaired relaxation). Right Ventricle: The right ventricular  size is normal. No increase in right ventricular wall thickness. Right ventricular systolic function is normal. There is normal pulmonary artery systolic pressure. The tricuspid regurgitant velocity is 2.12 m/s, and  with an assumed right atrial pressure of 3 mmHg, the estimated right ventricular systolic pressure is 21.0 mmHg. Left Atrium: Left atrial size was normal in size. Right Atrium: Right atrial size was normal in size. Pericardium: There is no evidence of pericardial effusion. Mitral Valve: The mitral valve is normal in structure. Trivial mitral valve regurgitation. No evidence of mitral valve stenosis. Tricuspid Valve: The tricuspid valve is normal in structure. Tricuspid valve regurgitation is trivial. No evidence of tricuspid stenosis. Aortic Valve: The aortic valve is calcified. Aortic valve regurgitation is not visualized. No aortic stenosis is present. Pulmonic Valve: The pulmonic valve was normal in structure. Pulmonic valve regurgitation is trivial. No evidence of pulmonic stenosis. Aorta: Aortic dilatation noted. There is borderline dilatation of the ascending aorta, measuring 39 mm. Venous: The inferior vena cava is normal in size with greater than 50% respiratory variability, suggesting right atrial pressure of 3 mmHg. IAS/Shunts: No atrial level shunt detected by color flow Doppler. Additional Comments: 3D was performed not requiring image post processing on an independent workstation and was normal.  LEFT VENTRICLE PLAX 2D LVIDd:         4.20 cm         Diastology LVIDs:         2.90 cm         LV e' medial:    5.77 cm/s LV PW:         1.10 cm         LV E/e' medial:  10.5 LV IVS:        1.30 cm         LV e' lateral:   6.42 cm/s LVOT diam:     2.30 cm         LV E/e' lateral: 9.4 LV SV:         76 LV SV Index:    32 LVOT Area:     4.15 cm        3D Volume EF LV IVRT:       134 msec        LV 3D EF:    Left                                             ventricul                                             ar                                             ejection                                             fraction  by 3D                                             volume is                                             53 %.                                 3D Volume EF:                                3D EF:        53 %                                LV EDV:       100 ml                                LV ESV:       47 ml                                LV SV:        54 ml RIGHT VENTRICLE RV Basal diam:  2.80 cm     PULMONARY VEINS RV S prime:     12.00 cm/s  A Reversal Velocity: 47.00 cm/s RVSP:           21.0 mmHg   Diastolic Velocity:  67.50 cm/s                             S/D Velocity:        0.70                             Systolic Velocity:   49.70 cm/s LEFT ATRIUM             Index        RIGHT ATRIUM           Index LA diam:        3.20 cm 1.35 cm/m   RA Pressure: 3.00 mmHg LA Vol (A2C):   33.9 ml 14.30 ml/m  RA Area:     12.40 cm LA Vol (A4C):   27.4 ml 11.56 ml/m  RA Volume:   24.20 ml  10.21 ml/m LA Biplane Vol: 31.6 ml 13.33 ml/m  AORTIC VALVE LVOT Vmax:   113.00 cm/s LVOT Vmean:  72.900 cm/s LVOT VTI:    0.184 m  AORTA Ao Root diam: 3.80 cm Ao Asc diam:  3.90 cm MITRAL VALVE               TRICUSPID VALVE MV Area (PHT):             TR Peak grad:   18.0 mmHg MV Decel Time  TR Vmax:        212.00 cm/s MV E velocity: 60.30 cm/s  Estimated RAP:  3.00 mmHg MV A velocity: 92.80 cm/s  RVSP:           21.0 mmHg MV E/A ratio:  0.65                            SHUNTS                            Systemic VTI:  0.18 m                            Systemic Diam: 2.30 cm Toribio Fuel MD Electronically signed by Toribio Fuel MD Signature Date/Time: 02/11/2024/9:57:02 PM     Final    DG Chest 1 View Result Date: 02/05/2024 CLINICAL DATA:  Oral bleeding. EXAM: CHEST  1 VIEW COMPARISON:  CT 01/05/2024 FINDINGS: Right chest port with tip in the SVC. Upper normal heart size. Aortic atherosclerosis. Prominence of the right hilum due to vascular structures on CT. No confluent opacity, large pleural effusion or pneumothorax. No pulmonary edema. On limited assessment, no acute osseous findings. IMPRESSION: 1. No acute chest findings. 2. Right chest port with tip in the SVC. Electronically Signed   By: Andrea Gasman M.D.   On: 02/05/2024 17:53    Microbiology: Results for orders placed or performed during the hospital encounter of 03/05/24  Resp panel by RT-PCR (RSV, Flu A&B, Covid) Anterior Nasal Swab     Status: None   Collection Time: 03/05/24 11:25 AM   Specimen: Anterior Nasal Swab  Result Value Ref Range Status   SARS Coronavirus 2 by RT PCR NEGATIVE NEGATIVE Final    Comment: (NOTE) SARS-CoV-2 target nucleic acids are NOT DETECTED.  The SARS-CoV-2 RNA is generally detectable in upper respiratory specimens during the acute phase of infection. The lowest concentration of SARS-CoV-2 viral copies this assay can detect is 138 copies/mL. A negative result does not preclude SARS-Cov-2 infection and should not be used as the sole basis for treatment or other patient management decisions. A negative result may occur with  improper specimen collection/handling, submission of specimen other than nasopharyngeal swab, presence of viral mutation(s) within the areas targeted by this assay, and inadequate number of viral copies(<138 copies/mL). A negative result must be combined with clinical observations, patient history, and epidemiological information. The expected result is Negative.  Fact Sheet for Patients:  bloggercourse.com  Fact Sheet for Healthcare Providers:  seriousbroker.it  This test is no t yet  approved or cleared by the [place]  FDA and  has been authorized for detection and/or diagnosis of SARS-CoV-2 by FDA under an Emergency Use Authorization (EUA). This EUA will remain  in effect (meaning this test can be used) for the duration of the COVID-19 declaration under Section 564(b)(1) of the Act, 21 U.S.C.section 360bbb-3(b)(1), unless the authorization is terminated  or revoked sooner.       Influenza A by PCR NEGATIVE NEGATIVE Final   Influenza B by PCR NEGATIVE NEGATIVE Final    Comment: (NOTE) The Xpert Xpress SARS-CoV-2/FLU/RSV plus assay is intended as an aid in the diagnosis of influenza from Nasopharyngeal swab specimens and should not be used as a sole basis for treatment. Nasal washings and aspirates are unacceptable for Xpert Xpress SARS-CoV-2/FLU/RSV testing.  Fact Sheet for Patients: bloggercourse.com  Fact Sheet for Healthcare Providers: seriousbroker.it  This test is not yet approved or cleared by the [place]  FDA and has been authorized for detection and/or diagnosis of SARS-CoV-2 by FDA under an Emergency Use Authorization (EUA). This EUA will remain in effect (meaning this test can be used) for the duration of the COVID-19 declaration under Section 564(b)(1) of the Act, 21 U.S.C. section 360bbb-3(b)(1), unless the authorization is terminated or revoked.     Resp Syncytial Virus by PCR NEGATIVE NEGATIVE Final    Comment: (NOTE) Fact Sheet for Patients: bloggercourse.com  Fact Sheet for Healthcare Providers: seriousbroker.it  This test is not yet approved or cleared by the [place]  FDA and has been authorized for detection and/or diagnosis of SARS-CoV-2 by FDA under an Emergency Use Authorization (EUA). This EUA will remain in effect (meaning this test can be used) for the duration of the COVID-19 declaration under Section 564(b)(1) of  the Act, 21 U.S.C. section 360bbb-3(b)(1), unless the authorization is terminated or revoked.  Performed at [facility], [address] [place], [place] [address]     Labs: CBC: Recent Labs  Lab 03/01/24 0912 03/05/24 1135 03/06/24 0333  WBC 7.8 8.6 7.2  NEUTROABS 5.7 7.5  --   HGB 11.2* 11.4* 10.7*  HCT 33.7* 35.5* 33.0*  MCV 80.6 83.7 83.3  PLT 303 317 307   Basic Metabolic Panel: Recent Labs  Lab 03/01/24 0912 03/05/24 1135 03/06/24 0333  NA 142 143 141  K 4.2 3.8 4.0  CL 102 103 104  CO2 31 30 32  GLUCOSE 97 182* 165*  BUN 22 26* 23  CREATININE 0.69 0.70 0.64  CALCIUM  9.3 9.1 9.0   Liver Function Tests: Recent Labs  Lab 03/01/24 0912 03/05/24 1135  AST 22 24  ALT 28 20  ALKPHOS 147* 129*  BILITOT 0.7 0.8  PROT 8.0 7.6  ALBUMIN 3.7 3.6   CBG: No results for input(s): GLUCAP in the last 168 hours.  Discharge time spent: greater than 30 minutes.  This record has been created using Conservation officer, historic buildings. Errors have been sought and corrected,but may not always be located. Such creation errors do not reflect on the standard of care.   Signed: Amaryllis Dare, MD [facility] 03/06/2024 "

## 2024-03-06 NOTE — Hospital Course (Addendum)
 Partly taken from H&P.  Melvin Hudson is a 69 y.o. male with medical history significant for HTN, DVT on Eliquis , HLD, anxiety, chronic back pain, chronic diastolic HF, esophageal cancer s/p radiation currently on chemotherapy who was sent from the cancer center for evaluation of dyspnea and wheezing.  Patient was seen at cancer center for removal of his pump after recent chemotherapy and was sent to the OR for significant wheezing.   Vital stable, hypoxic in mid 80s with ambulation requiring 2 L of oxygen.  Labs mostly at baseline.  EKG with LAFB and RBBB. CTA chest was negative for PE, did show her retained bubbly secretions within the proximal right mediastinum bronchus which may represent aspiration or retained secretions with background emphysema and diffuse bronchial wall thickening.  Patient was started on steroid for concern of COPD exacerbation with underlying significant emphysema.  1/18: Vital and labs stable, saturating well on room air , seems like patient need to remind clearing throat and having some cough to clear out his respiratory passages.  He was given some Mucinex  to help.  He was also given a nebulizer machine to use at home with DuoNeb as needed for wheezing or shortness of breath.  Patient was ambulated without any desaturation or wheezing.  He was also given few more days of prednisone  for COPD exacerbation with history of underlying emphysema.  He will continue with his home medications and G-tube feeding and follow-up with his providers for further assistance.

## 2024-03-06 NOTE — TOC Progression Note (Signed)
 Transition of Care [facility]) - Progression Note    Patient Details  Name: Melvin Hudson MRN: [id number] Date of Birth: 05-06-1955  Transition of Care [facility]) CM/SW Contact  Doneta Glenys DASEN, RN Phone Number: 03/06/2024, 1:08 PM  Clinical Narrative:     PTA lives alone. [employer] - DME for Nebulizer. CM signing off  Expected Discharge Plan: Home/Self Care Barriers to Discharge: Barriers Resolved               Expected Discharge Plan and Services In-house Referral: NA Discharge Planning Services: CM Consult Post Acute Care Choice: NA Living arrangements for the past 2 months: Apartment Expected Discharge Date: 03/06/24               DME Arranged: Nebulizer machine DME Agency: [employer] Date DME Agency Contacted: 03/06/24 Time DME Agency Contacted: 1300 Representative spoke with at DME Agency: [place] HH Arranged: NA HH Agency: NA         Social Drivers of Health (SDOH) Interventions SDOH Screenings   Food Insecurity: No Food Insecurity (03/05/2024)  Recent Concern: Food Insecurity - Food Insecurity Present (02/02/2024)  Housing: Unknown (03/05/2024)  Transportation Needs: Unmet Transportation Needs (03/05/2024)  Utilities: Not At Risk (03/05/2024)  Depression (PHQ2-9): Low Risk (03/03/2024)  Financial Resource Strain: High Risk (06/15/2023)  Social Connections: Socially Isolated (03/05/2024)  Tobacco Use: Medium Risk (03/05/2024)    Readmission Risk Interventions     No data to display

## 2024-03-06 NOTE — Progress Notes (Signed)
 Initial Nutrition Assessment  DOCUMENTATION CODES:  Not applicable  INTERVENTION:  Continue TF via PEG: Osmolite 1.5 474 ml TID Add Prosource TF20 60 ml BID Free water  flushes 60 ml before and after each bolus feeding and 200 ml QID. Provides 2290 kcal, 129 gm protein, 2246 ml free water  (including flushes).   NUTRITION DIAGNOSIS:  Inadequate oral intake related to dysphagia as evidenced by NPO status.  GOAL:  Patient will meet greater than or equal to 90% of their needs  MONITOR:  TF tolerance, Skin  REASON FOR ASSESSMENT:  Consult Enteral/tube feeding initiation and management  ASSESSMENT:  69 yo male admitted with acute respiratory failure with wheezing and dyspnea. PMH includes HTN, DVT, HLD, anxiety, chronic back pain, CHF, DM, esophageal cancer S/P radiation currently on chemotherapy, dysphagia, G-tube.  Patient reports usual TF regimen is Nutren 1.5, 1.5 containers QID. Sometimes he does more or less, but tries to make sure he gets 6 containers in every day. Also takes Prosource protein supplement once or twice daily. He reports his weight was 260 lbs in July 2025, now down to 229 lbs. 12% weight loss x 6 months is severe.  He does not take anything by mouth. He swishes his mouth with water  to keep it moist, but does not swallow the water .   Current TF orders: Osmolite 1.5 474 ml (2 cartons) TID. Free water  flushes 60 ml before and after each bolus feeding plus 200 ml QID. This provides 2130 kcal, 89 gm protein, 1086 ml free water  (2246 ml total free water  with flushes).  Labs reviewed.  Medications reviewed and include pepcid , solu-medrol .  Weight history reviewed.  Patient has had a 6% weight loss within the past month, which is severe.  Suspect patient is malnourished; unable to obtain enough information at this time for identification of malnutrition.   NUTRITION - FOCUSED PHYSICAL EXAM: Unable to complete, RD working remotely  Diet Order:   Diet Order              Diet NPO time specified  Diet effective now                  EDUCATION NEEDS:  Education needs have been addressed  Skin:  Skin Assessment: Reviewed RN Assessment  Last BM:  1/17  Height:  Ht Readings from Last 1 Encounters:  03/05/24 6' 2 (1.88 m)   Weight:  Wt Readings from Last 1 Encounters:  03/05/24 104.1 kg   Ideal Body Weight:  86.4 kg  BMI:  Body mass index is 29.47 kg/m.  Estimated Nutritional Needs:  Kcal:  2250-2500 Protein:  125-145 gm Fluid:  2.2-2.5 L   Suzen HUNT RD, LDN, CNSC Contact via secure chat. If unavailable, use group chat RD Inpatient.

## 2024-03-06 NOTE — Care Management Obs Status (Signed)
 MEDICARE OBSERVATION STATUS NOTIFICATION   Patient Details  Name: Melvin Hudson MRN: [id number] Date of Birth: [birth year]   Medicare Observation Status Notification Given:  Yes    Doneta Glenys DASEN, RN 03/06/2024, 1:14 PM

## 2024-03-06 NOTE — TOC Initial Note (Addendum)
 Transition of Care [facility]) - Initial/Assessment Note    Patient Details  Name: Melvin Hudson MRN: [id number] Date of Birth: 10-16-1955  Transition of Care [facility]) CM/SW Contact:    Melvin Glenys DASEN, RN Phone Number: 03/06/2024, 1:15 PM  Clinical Narrative:                 MOON completed. PTA lives alone. [employer] - DME for Nebulizer. CM signing off  Expected Discharge Plan: Home/Self Care Barriers to Discharge: Barriers Resolved   Patient Goals and CMS Choice Patient states their goals for this hospitalization and ongoing recovery are:: Home CMS Medicare.gov Compare Post Acute Care list provided to:: Patient Choice offered to / list presented to : Patient [place] ownership interest in [facility].provided to:: Patient    Expected Discharge Plan and Services In-house Referral: NA Discharge Planning Services: CM Consult Post Acute Care Choice: NA Living arrangements for the past 2 months: Apartment Expected Discharge Date: 03/06/24               DME Arranged: Nebulizer machine DME Agency: [employer] Date DME Agency Contacted: 03/06/24 Time DME Agency Contacted: 1300 Representative spoke with at DME Agency: Melvin Hudson Arranged: NA Hudson Agency: NA        Prior Living Arrangements/Services Living arrangements for the past 2 months: Apartment Lives with:: Self Patient language and need for interpreter reviewed:: Yes Do you feel safe going back to the place where you live?: Yes      Need for Family Participation in Patient Care: Yes (Comment) Care giver support system in place?: Yes (comment) Current home services:  (cane, CPAP, walker) Criminal Activity/Legal Involvement Pertinent to Current Situation/Hospitalization: No - Comment as needed  Activities of Daily Living   ADL Screening (condition at time of admission) Independently performs ADLs?: Yes (appropriate for developmental age) Is the patient deaf or have difficulty hearing?: No Does the  patient have difficulty seeing, even when wearing glasses/contacts?: No Does the patient have difficulty concentrating, remembering, or making decisions?: Yes  Permission Sought/Granted Permission sought to share information with : Case Manager Permission granted to share information with : Yes, Verbal Permission Granted  Share Information with NAME: Melvin Hudson  Sister, Emergency Contact  [phone]  Permission granted to share info w AGENCY: [employer]        Emotional Assessment Appearance:: Appears stated age Attitude/Demeanor/Rapport: Engaged Affect (typically observed): Appropriate Orientation: : Oriented to Self, Oriented to Place, Oriented to  Time, Oriented to Situation Alcohol  / Substance Use: Not Applicable Psych Involvement: No (comment)  Admission diagnosis:  Acute respiratory failure with hypoxia (HCC) [J96.01] Aspiration into airway, initial encounter [T17.908A] Dyspnea, unspecified type [R06.00] Patient Active Problem List   Diagnosis Date Noted   Acute respiratory failure with hypoxia (HCC) 03/05/2024   Dyspnea 03/05/2024   Aspiration into airway 03/05/2024   Emphysema lung (HCC) 03/05/2024   History of esophageal cancer 03/05/2024   History of DVT (deep vein thrombosis) 03/05/2024   S/P percutaneous endoscopic gastrostomy (PEG) tube placement (HCC) 03/05/2024   DVT (deep venous thrombosis) (HCC) 02/07/2024   Dyslipidemia 02/07/2024   Back pain 02/07/2024   Oral bleeding 02/06/2024   Malnutrition of moderate degree 06/12/2023   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   Dysphagia 03/02/2023   Hypocalcemia 04/17/2022   Anxiety state 04/15/2022   AKI (acute kidney injury) 04/15/2022   Chronic diastolic CHF (congestive heart failure) (HCC) 04/15/2022   Syncope 04/15/2022   Nonspecific abnormal electrocardiogram (ECG) (EKG) 04/15/2022  GI bleed 04/13/2022   PICC (peripherally inserted central catheter) flush 01/01/2022   Status post  radiation therapy 12/05/2020   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   Edentulous 12/05/2020   Ill-fitting dentures 12/05/2020   Former smoker 11/11/2019   Healthcare maintenance 11/11/2019   Bilateral leg edema 04/21/2018   Heart murmur 04/20/2018   Erectile dysfunction 04/20/2018   Pure hypercholesterolemia 04/20/2018   Class 1 obesity due to excess calories with body mass index (BMI) of 31.0 to 31.9 in adult    BRBPR (bright red blood per rectum) 08/30/2016   Chronic pain syndrome    History of GI diverticular bleed 04/18/2014   Hematochezia 04/10/2014   Symptomatic anemia 04/08/2014   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 [place] [address] Phone: [phone] Fax: [phone]     Social Drivers of Health (SDOH) Social History: SDOH Screenings   Food Insecurity: No Food Insecurity (03/05/2024)  Recent Concern: Food Insecurity - Food Insecurity Present (02/02/2024)  Housing: Unknown (03/05/2024)  Transportation Needs: Unmet Transportation Needs (03/05/2024)  Utilities: Not At Risk (03/05/2024)  Depression (PHQ2-9): Low Risk (03/03/2024)  Financial Resource Strain: High Risk (06/15/2023)  Social Connections: Socially Isolated (03/05/2024)  Tobacco Use: Medium Risk (03/05/2024)   SDOH Interventions:     Readmission Risk Interventions     No data to display

## 2024-03-07 ENCOUNTER — Ambulatory Visit

## 2024-03-08 ENCOUNTER — Telehealth: Payer: Self-pay | Admitting: Hematology

## 2024-03-08 ENCOUNTER — Inpatient Hospital Stay

## 2024-03-08 ENCOUNTER — Inpatient Hospital Stay: Admitting: Hematology

## 2024-03-08 DIAGNOSIS — C153 Malignant neoplasm of upper third of esophagus: Secondary | ICD-10-CM

## 2024-03-08 DIAGNOSIS — Z5112 Encounter for antineoplastic immunotherapy: Secondary | ICD-10-CM | POA: Diagnosis not present

## 2024-03-08 NOTE — Assessment & Plan Note (Signed)
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 -EGD on 05/27/2023 which showed a mass in the proximal esophagus, 2 to 3 cm below introitus.  Biopsy confirmed invasive squamous cell carcinoma.  -PET scan showed hypermetabolic lesion in the proximal esophagus, with a positive peritracheal lymph node.  No distant metastasis -Status post PEG feeding tube placement  -He started chemoRT with weekly carbo and Taxol  on 06/24/2023 and completed on 08/03/23 -ED visit for chest pain on 06/29/2023 -repeated upper EGD and biopsy was negative for residual tumor on 10/30/2023 - Given the significant residual hypermetabolic activity on the PET scan and his high risk for recurrence, I recommend nivolumab  for 1 year. He started on 11/30/2023 -PET on 01/21/2024 showed disease progression at primary site, no distant mets. Plan to change chemo to FOLFOX and Tislelizumab (Tevimbra). PD-L1 test on biopsy attempted but insufficient tissue

## 2024-03-08 NOTE — Progress Notes (Signed)
 " [facility]   [phone] [phone]   Clinic Follow up Note   Patient Care Team: Haze Kingfisher, MD as PCP - General (Family Medicine) Michele Richardson, DO as PCP - Cardiology (Cardiology) Clance, Francis HERO, MD as Referring Physician (Pulmonary Disease) Lonn Hicks, MD as Consulting Physician (Hematology and Oncology) [name], MD as Attending Physician (Radiation Oncology) [name], MD as Consulting Physician (Hematology and Oncology) 03/08/2024  I connected with Melvin Hudson on 03/08/24 at 10:00 AM EST by telephone and verified that I am speaking with the correct person using two identifiers.   I discussed the limitations, risks, security and privacy concerns of performing an evaluation and management service by telephone and the availability of in person appointments. I also discussed with the patient that there may be a patient responsible charge related to this service. The patient expressed understanding and agreed to proceed.   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 -EGD on 05/27/2023 which showed a mass in the proximal esophagus, 2 to 3 cm below introitus.  Biopsy confirmed invasive squamous cell carcinoma.  -PET scan showed hypermetabolic lesion in the proximal esophagus, with a positive peritracheal lymph node.  No distant metastasis -Status post PEG feeding tube placement  -He started chemoRT with weekly carbo and Taxol  on 06/24/2023 and completed on 08/03/23 -ED visit for chest pain on 06/29/2023 -repeated upper EGD and biopsy was negative for residual tumor on 10/30/2023 - Given the significant residual hypermetabolic activity on the PET scan and his  high risk for recurrence, I recommend nivolumab  for 1 year. He started on 11/30/2023 -PET on 01/21/2024 showed disease progression at primary site, no distant mets. Plan to change chemo to FOLFOX and Tislelizumab (Tevimbra). 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 He tolerated the initial cycle without adverse effects or pain, and respiratory symptoms have significantly improved since initiation of treatment. No new complaints reported. - Scheduled next chemotherapy infusion for March 16, 2024 at 7:45 AM. - Planned follow-up in the infusion room on March 16, 2024.  Airway secretions with possible aspiration Recent CT scan revealed significant airway secretions, raising concern for ongoing aspiration. He remains nil per os. Swallow evaluation is scheduled to assess aspiration risk and guide management, with anticipated input from speech therapy. - Scheduled swallow evaluation for March 11, 2024 at 1:00 PM at [facility] Radiology Department. - Advised arrival at least 30 minutes prior to the scheduled swallow evaluation. - Anticipated recommendations from speech therapy following swallow evaluation.  Plan - He overall feels much better after his first cycle of chemotherapy last week, dyspnea and wheezing has much improved. - With his CT chest scan findings, which is concerning for aspiration.  Aspiration precautions reviewed with him.  He has swallow evaluation scheduled for later this week - Follow-up next week before cycle 2 chemo, and will add Tevimbra if it's approved    SUMMARY OF ONCOLOGIC HISTORY: Oncology History  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HCC)  06/14/2013 Procedure   Biopsy of the tonsil confirmrf squamous cell carcinoma. 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 Progressive circumferential wall thickening of the esophagus with associated diffuse hypermetabolic activity concerning for progressive esophageal cancer. 2. No evidence of adjacent hypermetabolic thoracic adenopathy or distant metastatic disease. 3. Mildly prominent activity near the hepatic flexure of the colon without obvious corresponding abnormality on the CT images. This is nonspecific, and could be physiologic. Correlate with colon cancer screening history. 4.  Aortic Atherosclerosis (ICD10-I70.0).     03/03/2024 -  Chemotherapy   Patient is on Treatment Plan : GASTRIC FOLFOX q14d x 12 cycles       Discussed the use of AI scribe software for clinical note transcription with the patient, who gave verbal consent to proceed.  History of Present Illness Melvin Hudson is a 69 year old male with unresectable malignant neoplasm of the upper third of the esophagus who presents for follow-up to review recent CT scan findings and assess response to initial chemotherapy.  He started his first cycle of chemotherapy last week and has tolerated it without adverse effects. He denies pain, new  symptoms, or functional decline and feels well overall.  His respiratory status has improved since starting treatment, with no new respiratory complaints, though he continues to have increased airway secretions. He remains nil per os.  Recent CT scan showed significant airway secretions. A swallow evaluation is scheduled later this week to further assess this issue.     REVIEW OF SYSTEMS:   Constitutional: Denies fevers, chills or abnormal weight loss Eyes: Denies blurriness of vision Ears, nose, mouth, throat, and face: Denies mucositis or sore throat Respiratory: Denies cough, dyspnea or wheezes Cardiovascular: Denies palpitation, chest discomfort or lower extremity swelling Gastrointestinal:  Denies nausea, heartburn or change in bowel habits Skin: Denies abnormal skin rashes Lymphatics: Denies new lymphadenopathy or easy bruising Neurological:Denies numbness, tingling or new weaknesses Behavioral/Psych: Mood is stable, no new changes  All other systems were reviewed with the patient and are negative.  MEDICAL HISTORY:  Past Medical History:  Diagnosis Date   Angioedema 09/17/2011   tongue and lips   Arthritis    both of my legs and feet (01/05/2014)   CHF (congestive heart failure) (HCC)    Diabetes mellitus without complication (HCC)    type 2   DVT (deep venous thrombosis) (HCC) 06/16/2023   LLE   Dysphagia    Erectile dysfunction 04/20/2018   Esophageal cancer (HCC)    GI bleed    Heart murmur 04/20/2018   History of radiation therapy    06/24/2023- 08/03/2023 Dr. 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    SURGICAL HISTORY: Past Surgical History:  Procedure Laterality Date   COLONOSCOPY Left 04/11/2014   Procedure: COLONOSCOPY;  Surgeon: Elsie Cree, MD;  Location: [facility] ENDOSCOPY;  Service: Endoscopy;  Laterality: Left;   COLONOSCOPY N/A 04/20/2014   Procedure: COLONOSCOPY;  Surgeon: Jerrell KYM Sol, MD;  Location: [facility] ENDOSCOPY;  Service: Endoscopy;  Laterality: N/A;   COLONOSCOPY W/ BIOPSIES AND POLYPECTOMY     benign   DIRECT LARYNGOSCOPY  01/05/2014   DIRECT LARYNGOSCOPY N/A 01/05/2014   Procedure: DIRECT LARYNGOSCOPY AND BIOPSY;  Surgeon: Ida Loader, MD;  Location: [facility] OR;  Service: ENT;  Laterality: N/A;   ESOPHAGEAL DILATION N/A 01/01/2024   Procedure: DILATION, ESOPHAGUS;  Surgeon: Loader Ida, MD;  Location: [facility] OR;  Service: ENT;  Laterality: N/A;   ESOPHAGOSCOPY WITH DILITATION N/A 05/27/2023   Procedure: ESOPHAGOSCOPY;  Surgeon: Loader Ida, MD;  Location: [facility] OR;  Service: ENT;  Laterality: N/A;   ESOPHAGOSCOPY WITH DILITATION N/A 10/30/2023   Procedure: ESOPHAGOSCOPY, WITH BIOPSY;  Surgeon: Loader Ida, MD;  Location: [facility] OR;  Service: ENT;  Laterality: N/A;  With possible biopsy   IR GASTROSTOMY TUBE MOD SED  06/12/2023   IR IMAGING GUIDED PORT INSERTION  11/24/2023   IR RADIOLOGIST EVAL & MGMT  08/10/2023   IR RADIOLOGIST EVAL & MGMT  08/14/2023   KNEE ARTHROSCOPY Right ~ 1994   LEFT AND RIGHT HEART CATHETERIZATION WITH CORONARY/GRAFT ANGIOGRAM N/A 12/31/2010   Procedure: LEFT AND RIGHT HEART CATHETERIZATION WITH EL BILE;  Surgeon: Erick JONELLE Bergamo, MD;  Location: [facility] CATH LAB;  Service: Cardiovascular;  Laterality: N/A;   MULTIPLE EXTRACTIONS WITH ALVEOLOPLASTY N/A 07/08/2013   Procedure: Extraction of tooth #'s 22, 27 with alveoloplasty and bilateal mandibular facial exostoses reductions;  Surgeon: Tanda JULIANNA Fanny, DDS;  Location: [facility] ORS;  Service: Oral Surgery;  Laterality: N/A;   MULTIPLE TOOTH EXTRACTIONS  ~ 2008    I have reviewed the social history and family history with the patient and they are unchanged from previous note.  ALLERGIES:  is allergic to azilsartan and advil  [ibuprofen].  MEDICATIONS:  Current Outpatient Medications  Medication Sig Dispense Refill   albuterol  (VENTOLIN  HFA) 108 (90 Base) MCG/ACT inhaler Inhale 2 puffs into the lungs every 4 (four) hours as needed for wheezing (or coughing).     allopurinol  (ZYLOPRIM ) 300 MG tablet Place 300 mg into feeding tube daily as needed (for gout flares).     apixaban  (ELIQUIS ) 5 MG TABS tablet Take 1 tablet (5 mg total) by mouth 2 (two) times daily. 60 tablet 1   atorvastatin  (LIPITOR) 40 MG tablet Take 1 tablet (40 mg total) by mouth at bedtime. (Patient taking differently: Place 40 mg into feeding tube at bedtime.) 90 tablet 1   bismuth subsalicylate (PEPTO BISMOL) 262 MG/15ML suspension Place 30 mLs into feeding tube every 6 (six) hours as needed for indigestion or diarrhea or loose stools.     Blood Glucose Monitoring Suppl (ONETOUCH VERIO FLEX SYSTEM) w/Device KIT USE TO CHECK BLOOD SUGAR TWICE DAILY     calcium  carbonate (TUMS - DOSED IN MG ELEMENTAL CALCIUM ) 500 MG chewable tablet Chew 2 tablets (400 mg of elemental calcium  total) by mouth 3 (three) times daily with meals. (Patient taking differently: Place 400 mg of elemental calcium  into feeding tube daily as  needed for indigestion or heartburn.) 180 tablet 1   carvedilol  (COREG ) 3.125 MG tablet Place 3.125 mg into feeding tube 2 (two) times daily with a meal.     cetirizine (ZYRTEC) 10 MG tablet Place 10 mg into feeding tube daily as needed for allergies or rhinitis.     clonazePAM  (KLONOPIN ) 1 MG disintegrating tablet Place 1 mg into feeding tube daily as needed (for anxiety).     dapagliflozin  propanediol (FARXIGA ) 10 MG TABS tablet 10 mg See admin instructions. 10 mg, per tube, once a day     diphenhydrAMINE  (BENADRYL ) 25 MG tablet Take 1 tablet (25 mg total) by mouth every 6 (six) hours. (Patient taking differently: Take 25 mg by mouth every 6 (six) hours as needed for itching.) 20 tablet 0   doxycycline  (VIBRA -TABS) 100 MG tablet Take 1 tablet  (100 mg total) by mouth 2 (two) times daily. 14 tablet 0   famotidine  (PEPCID ) 40 MG/5ML suspension PLACE 2.5 MILLILITERS INTO THE FEEDING TUBE DAILY (Patient taking differently: Place 20 mg into feeding tube daily before breakfast.) 50 mL 1   gabapentin  (NEURONTIN ) 300 MG capsule Place 300 mg into feeding tube 2 (two) times daily. Feeding tube  2   guaiFENesin  (ROBITUSSIN) 100 MG/5ML liquid Place 15 mLs into feeding tube every 4 (four) hours as needed for cough or to loosen phlegm. 300 mL 0   hydrocortisone  (ANUSOL -HC) 2.5 % rectal cream Place 1 Application rectally daily as needed for hemorrhoids or anal itching.     ipratropium-albuterol  (DUONEB) 0.5-2.5 (3) MG/3ML SOLN Take 3 mLs by nebulization every 6 (six) hours as needed. 360 mL 2   isosorbide -hydrALAZINE  (BIDIL ) 20-37.5 MG tablet TAKE 1 TABLET BY MOUTH IN THE MORNING AND AT BEDTIME (Patient taking differently: Place 1 tablet into feeding tube in the morning and at bedtime.) 180 tablet 1   lidocaine -prilocaine  (EMLA ) cream Apply 1 Application topically as needed (as directed). 5 g 0   magic mouthwash (nystatin , lidocaine , diphenhydrAMINE , alum & mag hydroxide) suspension Swish and Swallow 5 mLs by mouth 4 (four) times daily as needed for mouth pain. Suspension contains equal amounts of Maalox Extra Strength, nystatin , diphenhydramine  and lidocaine . 140 mL 1   morphine  20 MG/5ML solution Take 2.5-5 mLs (10-20 mg total) by mouth every 4 (four) hours as needed for pain. 100 mL 0   Nutritional Supplements (NUTREN 1.5) LIQD Nutren 1.5 - give 2 cartons TID (1500 ml/day). Flush tube with 60 ml water  before and after each bolus + additional 200 ml free water  QID. Provides 2250 kcal, 102 g, 1146 ml free water  (2306 ml total water ). Meets >85% of needs.     ondansetron  (ZOFRAN ) 8 MG tablet Take 1 tablet (8 mg total) by mouth every 8 (eight) hours as needed for nausea or vomiting. (Patient taking differently: Place 8 mg into feeding tube every 8 (eight)  hours as needed for nausea or vomiting.) 20 tablet 2   ONETOUCH VERIO test strip USE TO CHECK BLOOD SUGAR TWICE DAILY     polyethylene glycol (MIRALAX  / GLYCOLAX ) 17 g packet Take 17 g by mouth daily. (Patient taking differently: Place 17 g into feeding tube daily as needed for mild constipation or moderate constipation.) 14 each 0   predniSONE  (DELTASONE ) 50 MG tablet Place 1 tablet (50 mg total) into feeding tube daily with breakfast for 4 days. 4 tablet 0   prochlorperazine  (COMPAZINE ) 10 MG tablet Take 1 tablet (10 mg total) by mouth every 6 (six) hours as needed for  nausea or vomiting. (Patient taking differently: Place 10 mg into feeding tube every 6 (six) hours as needed for nausea or vomiting.) 30 tablet 2   Protein (FEEDING SUPPLEMENT, PROSOURCE TF20,) liquid Place 60 mLs into feeding tube daily. 1800 mL 1   psyllium (HYDROCIL/METAMUCIL) 95 % PACK Take 1 packet by mouth daily. (Patient taking differently: Take 1 packet by mouth daily as needed for mild constipation or moderate constipation.) 240 each 0   Water  For Irrigation, Sterile (FREE WATER ) SOLN Place 100 mLs into feeding tube every 6 (six) hours.     No current facility-administered medications for this visit.    PHYSICAL EXAMINATION: Not performed   LABORATORY DATA:  I have reviewed the data as listed    Latest Ref Rng & Units 03/06/2024    3:33 AM 03/05/2024   11:35 AM 03/01/2024    9:12 AM  CBC  WBC 4.0 - 10.5 K/uL 7.2  8.6  7.8   Hemoglobin 13.0 - 17.0 g/dL 89.2  88.5  88.7   Hematocrit 39.0 - 52.0 % 33.0  35.5  33.7   Platelets 150 - 400 K/uL 307  317  303         Latest Ref Rng & Units 03/06/2024    3:33 AM 03/05/2024   11:35 AM 03/01/2024    9:12 AM  CMP  Glucose 70 - 99 mg/dL 834  817  97   BUN 8 - 23 mg/dL 23  26  22    Creatinine 0.61 - 1.24 mg/dL 9.35  9.29  9.30   Sodium 135 - 145 mmol/L 141  143  142   Potassium 3.5 - 5.1 mmol/L 4.0  3.8  4.2   Chloride 98 - 111 mmol/L 104  103  102   CO2 22 - 32 mmol/L  32  30  31   Calcium  8.9 - 10.3 mg/dL 9.0  9.1  9.3   Total Protein 6.5 - 8.1 g/dL  7.6  8.0   Total Bilirubin 0.0 - 1.2 mg/dL  0.8  0.7   Alkaline Phos 38 - 126 U/L  129  147   AST 15 - 41 U/L  24  22   ALT 0 - 44 U/L  20  28       RADIOGRAPHIC STUDIES: I have personally reviewed the radiological images as listed and agreed with the findings in the report. No results found.     I discussed the assessment and treatment plan with the patient. The patient was provided an opportunity to ask questions and all were answered. The patient agreed with the plan and demonstrated an understanding of the instructions.   The patient was advised to call back or seek an in-person evaluation if the symptoms worsen or if the condition fails to improve as anticipated.  I provided 15 minutes of non face-to-face telephone visit time during this encounter, including review of chart and various tests results, discussions about plan of care and coordination of care plan.    Onita Mattock, MD 03/08/24     "

## 2024-03-11 ENCOUNTER — Ambulatory Visit (HOSPITAL_COMMUNITY): Admission: RE | Admit: 2024-03-11 | Source: Ambulatory Visit

## 2024-03-11 ENCOUNTER — Ambulatory Visit (HOSPITAL_COMMUNITY)
Admission: RE | Admit: 2024-03-11 | Discharge: 2024-03-11 | Disposition: A | Source: Ambulatory Visit | Attending: Family Medicine

## 2024-03-11 DIAGNOSIS — C153 Malignant neoplasm of upper third of esophagus: Secondary | ICD-10-CM | POA: Diagnosis not present

## 2024-03-11 DIAGNOSIS — R1310 Dysphagia, unspecified: Secondary | ICD-10-CM | POA: Diagnosis present

## 2024-03-11 DIAGNOSIS — K219 Gastro-esophageal reflux disease without esophagitis: Secondary | ICD-10-CM | POA: Diagnosis not present

## 2024-03-11 DIAGNOSIS — R059 Cough, unspecified: Secondary | ICD-10-CM | POA: Diagnosis not present

## 2024-03-11 DIAGNOSIS — Z85818 Personal history of malignant neoplasm of other sites of lip, oral cavity, and pharynx: Secondary | ICD-10-CM | POA: Diagnosis not present

## 2024-03-11 DIAGNOSIS — R1314 Dysphagia, pharyngoesophageal phase: Secondary | ICD-10-CM | POA: Insufficient documentation

## 2024-03-11 DIAGNOSIS — J392 Other diseases of pharynx: Secondary | ICD-10-CM | POA: Insufficient documentation

## 2024-03-11 DIAGNOSIS — Z931 Gastrostomy status: Secondary | ICD-10-CM | POA: Diagnosis not present

## 2024-03-11 DIAGNOSIS — Z923 Personal history of irradiation: Secondary | ICD-10-CM | POA: Diagnosis not present

## 2024-03-11 NOTE — Progress Notes (Signed)
 Modified Barium Swallow Study  Patient Details  Name: Melvin Hudson MRN: [id number] Date of Birth: 1955-05-20  Today's Date: 03/11/2024  Modified Barium Swallow completed.  Full report located under Chart Review in the Imaging Section.  History of Present Illness 69 yo male presenting for an OP MBS 1/23 to evaluate swallowing after completing XRT treatments for SCC of the proximal esophagus. Also note pt with history of SCC of the R tonsil s/p XRT to the bilateral neck in 2015. EGD 05/27/23 showed a mass in the proximal esophagus, ~3 cm below the PES and biopsy confirmed squamous cell carcinoma. S/p PEG placement. Has had multiple laryngoscopies and esophagoscopies with ENT, most recently 01/01/24 with multiple attempts at dilation that were incomplete. PET scan 01/21/24 shows disease progression at the primary site but no distant mets. Esophagram 12/18 shows a prominent cricopharyngeus and reports aspiration that limited the study. Pt was seen in ED 12/19 after spitting up ~300 cc of blood and admitted 12/20-12/22 for the same. Has been f/u with OP SLP since June 2025. 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 This inhibits transit of the majority of each bolus from the pharynx. There is diffuse residue that is retained from the base of tongue to the pyriform sinuses and is not cleared with multiple, effortful swallows. The head of the bolus typically progresses past the pyriform sinuses but sits above the PES and as the cricopharyngeus moves inferiorly back to rest, it pushes the bolus back up towards the pyriform sinuses and open larygneal vestibule. This causes gross aspiration of thin liquids (PAS 7). The retrograde bolus flow through the pharynx also pushes material past the velum and into the nasal cavity. This did not improve with a super supraglottic  swallow. There was no penetration/aspiration with purees but arguably even further reduced pharyngeal transit. Discussed continuing to use HEP provided by OP SLP and trial tspn sips of water  (neutral pH and free of bacteria) with frequent oral care. Otherwise, continue nutrition primarily via PEG. Encourage ongoing f/u with SLP on an OP basis.   DIGEST Swallow Severity Rating*  Safety: 4  Efficiency: 4   Overall Pharyngeal Swallow Severity: 4 (profound) 1: mild; 2: moderate; 3: severe; 4: profound  *The Dynamic Imaging Grade of Swallowing Toxicity is standardized for the head and neck cancer population, however, demonstrates promising clinical applications across populations to standardize the clinical rating of pharyngeal swallow safety and severity.  Factors that may increase risk of adverse event in presence of aspiration Noe & Lianne 2021): Limited mobility;Frail or deconditioned;Inadequate oral hygiene;Presence of tubes (ETT, trach, NG, etc.);Frequent aspiration of large volumes  Swallow Evaluation Recommendations Recommendations: NPO;Alternative means of nutrition - G Tube;Free water  protocol after oral care Liquid Administration via: Spoon Medication Administration: Via alternative means Oral care recommendations: Oral care QID (4x/day);Oral care before ice chips/water    Damien Blumenthal, M.A., CCC-SLP Speech Language Pathology, Acute Rehabilitation Services  Secure Chat preferred [phone]  03/11/2024,2:53 PM

## 2024-03-15 ENCOUNTER — Encounter: Payer: Self-pay | Admitting: Dietician

## 2024-03-15 NOTE — Progress Notes (Signed)
 Received message from patient requesting return call from RD. Patient did not answer. VM full and unable to leave message.

## 2024-03-15 NOTE — Assessment & Plan Note (Signed)
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 -EGD on 05/27/2023 which showed a mass in the proximal esophagus, 2 to 3 cm below introitus.  Biopsy confirmed invasive squamous cell carcinoma.  -PET scan showed hypermetabolic lesion in the proximal esophagus, with a positive peritracheal lymph node.  No distant metastasis -Status post PEG feeding tube placement  -He started chemoRT with weekly carbo and Taxol  on 06/24/2023 and completed on 08/03/23 -ED visit for chest pain on 06/29/2023 -repeated upper EGD and biopsy was negative for residual tumor on 10/30/2023 - Given the significant residual hypermetabolic activity on the PET scan and his high risk for recurrence, I recommend nivolumab  for 1 year. He started on 11/30/2023 -PET on 01/21/2024 showed disease progression at primary site, no distant mets. Plan to change chemo to FOLFOX and Tislelizumab (Tevimbra). PD-L1 test on biopsy attempted but insufficient tissue

## 2024-03-16 ENCOUNTER — Inpatient Hospital Stay

## 2024-03-16 ENCOUNTER — Inpatient Hospital Stay: Admitting: Dietician

## 2024-03-16 ENCOUNTER — Inpatient Hospital Stay (HOSPITAL_BASED_OUTPATIENT_CLINIC_OR_DEPARTMENT_OTHER): Admitting: Hematology

## 2024-03-16 ENCOUNTER — Other Ambulatory Visit (HOSPITAL_COMMUNITY): Payer: Self-pay

## 2024-03-16 VITALS — BP 122/77 | HR 81 | Temp 98.0°F | Resp 16 | Wt 218.5 lb

## 2024-03-16 DIAGNOSIS — C153 Malignant neoplasm of upper third of esophagus: Secondary | ICD-10-CM

## 2024-03-16 DIAGNOSIS — Z5112 Encounter for antineoplastic immunotherapy: Secondary | ICD-10-CM | POA: Diagnosis not present

## 2024-03-16 LAB — CBC WITH DIFFERENTIAL (CANCER CENTER ONLY)
Abs Immature Granulocytes: 0.05 10*3/uL (ref 0.00–0.07)
Basophils Absolute: 0 10*3/uL (ref 0.0–0.1)
Basophils Relative: 0 %
Eosinophils Absolute: 0.2 10*3/uL (ref 0.0–0.5)
Eosinophils Relative: 3 %
HCT: 32.9 % — ABNORMAL LOW (ref 39.0–52.0)
Hemoglobin: 10.9 g/dL — ABNORMAL LOW (ref 13.0–17.0)
Immature Granulocytes: 1 %
Lymphocytes Relative: 9 %
Lymphs Abs: 0.6 10*3/uL — ABNORMAL LOW (ref 0.7–4.0)
MCH: 27.1 pg (ref 26.0–34.0)
MCHC: 33.1 g/dL (ref 30.0–36.0)
MCV: 81.8 fL (ref 80.0–100.0)
Monocytes Absolute: 0.6 10*3/uL (ref 0.1–1.0)
Monocytes Relative: 9 %
Neutro Abs: 5.4 10*3/uL (ref 1.7–7.7)
Neutrophils Relative %: 78 %
Platelet Count: 203 10*3/uL (ref 150–400)
RBC: 4.02 MIL/uL — ABNORMAL LOW (ref 4.22–5.81)
RDW: 20.9 % — ABNORMAL HIGH (ref 11.5–15.5)
WBC Count: 6.9 10*3/uL (ref 4.0–10.5)
nRBC: 1.3 % — ABNORMAL HIGH (ref 0.0–0.2)

## 2024-03-16 LAB — CMP (CANCER CENTER ONLY)
ALT: 22 U/L (ref 0–44)
AST: 22 U/L (ref 15–41)
Albumin: 3.6 g/dL (ref 3.5–5.0)
Alkaline Phosphatase: 122 U/L (ref 38–126)
Anion gap: 7 (ref 5–15)
BUN: 20 mg/dL (ref 8–23)
CO2: 30 mmol/L (ref 22–32)
Calcium: 8.8 mg/dL — ABNORMAL LOW (ref 8.9–10.3)
Chloride: 109 mmol/L (ref 98–111)
Creatinine: 0.71 mg/dL (ref 0.61–1.24)
GFR, Estimated: >60 mL/min
Glucose, Bld: 173 mg/dL — ABNORMAL HIGH (ref 70–99)
Potassium: 4.1 mmol/L (ref 3.5–5.1)
Sodium: 147 mmol/L — ABNORMAL HIGH (ref 135–145)
Total Bilirubin: 0.8 mg/dL (ref 0.0–1.2)
Total Protein: 6.9 g/dL (ref 6.5–8.1)

## 2024-03-16 MED ORDER — SODIUM CHLORIDE 0.9 % IV SOLN
2400.0000 mg/m2 | INTRAVENOUS | Status: DC
  Administered 2024-03-16: 5600 mg via INTRAVENOUS
  Filled 2024-03-16: qty 112

## 2024-03-16 MED ORDER — MORPHINE SULFATE 20 MG/5ML PO SOLN
10.0000 mg | Freq: Four times a day (QID) | ORAL | 0 refills | Status: AC | PRN
  Filled 2024-03-16: qty 200, 10d supply, fill #0

## 2024-03-16 MED ORDER — OXALIPLATIN CHEMO INJECTION 100 MG/20ML
85.0000 mg/m2 | Freq: Once | INTRAVENOUS | Status: AC
  Administered 2024-03-16: 200 mg via INTRAVENOUS
  Filled 2024-03-16: qty 40

## 2024-03-16 MED ORDER — PALONOSETRON HCL INJECTION 0.25 MG/5ML
0.2500 mg | Freq: Once | INTRAVENOUS | Status: AC
  Administered 2024-03-16: 0.25 mg via INTRAVENOUS
  Filled 2024-03-16: qty 5

## 2024-03-16 MED ORDER — DEXTROSE 5 % IV SOLN: INTRAVENOUS | Status: DC

## 2024-03-16 MED ORDER — LEUCOVORIN CALCIUM INJECTION 350 MG
400.0000 mg/m2 | Freq: Once | INTRAVENOUS | Status: AC
  Administered 2024-03-16: 936 mg via INTRAVENOUS
  Filled 2024-03-16: qty 46.8

## 2024-03-16 MED ORDER — SODIUM CHLORIDE 0.9% FLUSH: 10.0000 mL | INTRAVENOUS | Status: DC | PRN

## 2024-03-16 MED ORDER — DEXAMETHASONE SOD PHOSPHATE PF 10 MG/ML IJ SOLN
10.0000 mg | Freq: Once | INTRAMUSCULAR | Status: AC
  Administered 2024-03-16: 10 mg via INTRAVENOUS
  Filled 2024-03-16: qty 1

## 2024-03-16 NOTE — Progress Notes (Signed)
 " Melvin Hudson Melvin Hudson   [phone] [phone]   Clinic Follow up Note   Patient Care Team: Melvin Hudson, Melvin Hudson as PCP - General (Family Medicine) Melvin Richardson, DO as PCP - Cardiology (Cardiology) Melvin Hudson, Melvin Hudson, Melvin Hudson as Referring Physician (Pulmonary Disease) Melvin Hudson, Melvin Hudson as Consulting Physician (Hematology and Oncology) Melvin Hudson, Melvin Hudson as Attending Physician (Radiation Oncology) Melvin Hudson, Melvin Hudson as Consulting Physician (Hematology and Oncology)  Date of Service:  03/16/2024  CHIEF COMPLAINT: f/u of Melvin Hudson  CURRENT THERAPY:  Chemotherapy FOLFOX every 2 weeks  Oncology History   Melvin Hudson, 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 -EGD on 05/27/2023 which showed a mass in the proximal esophagus, 2 to 3 cm below introitus.  Biopsy confirmed invasive squamous cell carcinoma.  -PET scan showed hypermetabolic lesion in the proximal esophagus, with a positive peritracheal lymph node.  No distant metastasis -Status post PEG feeding tube placement  -He started chemoRT with weekly carbo and Taxol  on 06/24/2023 and completed on 08/03/23 -ED Hudson for chest Hudson on 06/29/2023 -repeated upper EGD and biopsy was negative for residual tumor on 10/30/2023 - Given the significant residual hypermetabolic activity on the PET scan and his high risk for recurrence, I recommend nivolumab  for 1 year. He started on 11/30/2023 -PET on 01/21/2024 showed disease progression at primary site, no distant mets. Plan to change chemo to FOLFOX and Tislelizumab (Tevimbra). 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 He tolerates chemotherapy well, with no new adverse effects.  Cold sensitivity post-chemotherapy is present and addressed. Immunotherapy is planned pending insurance approval. - Administered chemotherapy per previous cycle. - Planned to initiate immunotherapy Tevimbra next cycle pending insurance approval. - Provided anticipatory guidance regarding cold sensitivity, including avoidance of cold foods/drinks and protection from cold air exposure. - Advised mask use and warm clothing outdoors to prevent respiratory symptoms. - Scheduled follow-up in two weeks.  Melvin Hudson Intermittent Melvin Hudson. He uses liquid analgesics as needed, with reduced frequency. Hudson management is titrated to symptom severity; alternatives for mild Hudson discussed. - Refilled liquid analgesic prescription to Melvin Hudson pharmacy. - Advised dose reduction as Hudson improves. - Recommended liquid acetaminophen  for mild Hudson.  Melvin Hudson Significant weight loss due to Melvin Hudson and reduced oral intake. He receives tube feeding and was recently evaluated by a dietitian. Ongoing monitoring and caloric adjustment are required. - Coordinated with dietitian to reassess and potentially increase caloric intake via tube feeding. - Advised home weight monitoring.  Aspiration risk due to Melvin Hudson Increased aspiration risk from Melvin tumor, with airway secretions on recent CT but no pneumonia or pulmonary embolism. Secretions remain a concern. Preventive strategies discussed. - Advised head-of-bed elevation during sleep to reduce aspiration risk. - Discussed use of recliner or additional pillows for head elevation. - Provided education on aspiration prevention.  Plan - He tolerated first cycle of FOLFOX well 2 weeks ago, we again reviewed cold sensitivity and management of potential side effects. - Lab reviewed, adequate for treatment, will proceed to second cycle FOLFOX today, plan to add to  Pembro in 2 weeks if insurance approves - Follow-up in 2 weeks - Will refill liquid morphine    SUMMARY OF ONCOLOGIC HISTORY: Oncology History  Melvin neoplasm of tonsillar fossa (HCC)  06/14/2013 Procedure   Biopsy of the tonsil confirmrf squamous cell carcinoma. 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 Progressive circumferential wall thickening of the esophagus with associated diffuse hypermetabolic activity concerning for progressive Melvin Hudson. 2. No evidence of adjacent hypermetabolic thoracic adenopathy or distant metastatic disease. 3. Mildly prominent activity near the hepatic flexure of the colon without obvious corresponding abnormality on the CT images. This is nonspecific, and could be physiologic. Correlate with colon Melvin screening history. 4.  Aortic Atherosclerosis (ICD10-I70.0).     03/03/2024 -  Chemotherapy   Patient is on Treatment Plan : GASTRIC FOLFOX q14d x 12 cycles        Discussed the use of AI scribe software for clinical note transcription with the patient, who gave verbal consent to  proceed.  History of Present Illness Melvin Hudson is a 69 year old male with metastatic Melvin Hudson who presents for follow-up to assess disease status and management of Melvin symptoms.  He is tolerating chemotherapy without new adverse effects. Respiratory symptoms have improved with no current dyspnea or respiratory distress. He ambulates independently and notes one day of markedly improved well-being, though subsequent days were less so.  He has lost 20 pounds and now weighs 218 pounds. All nutrition is via tube feeds with no oral intake. He recently met with a dietitian to increase caloric intake and was advised to monitor his weight at home.  Back Hudson is improved compared to prior visits. He uses liquid Hudson medication every 3 to 4 hours, up to six times daily, with good effect for comfort and sleep. He is not on corticosteroids.  He remains at high aspiration risk from his Melvin tumor. Recent CT showed airway secretions without pneumonia or pulmonary embolism. He continues to avoid oral intake, though secretions persist.  He has cold sensitivity for about 5 days after chemotherapy and was advised to avoid cold exposure and cold foods or drinks during that period. He rarely leaves home and travels by car with assistance when he does.  He and his family report housing concerns related to his illness, including the need for his sister to stay in the same building to assist with his care.     All other systems were reviewed with the patient and are negative.  MEDICAL HISTORY:  Past Medical History:  Diagnosis Date   Angioedema 09/17/2011   tongue and lips   Arthritis    both of my legs and feet (01/05/2014)   CHF (congestive heart failure) (HCC)    Diabetes mellitus  without complication (HCC)    type 2   DVT (deep venous thrombosis) (HCC) 06/16/2023   LLE   Dysphagia    Erectile dysfunction 04/20/2018   Melvin Hudson (HCC)    GI bleed    Heart  murmur 04/20/2018   History of radiation therapy    06/24/2023- 08/03/2023 Dr. 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    SURGICAL HISTORY: Past Surgical History:  Procedure Laterality Date   COLONOSCOPY Left 04/11/2014   Procedure: COLONOSCOPY;  Surgeon: Elsie Cree, Melvin Hudson;  Location: [facility] ENDOSCOPY;  Service: Endoscopy;  Laterality: Left;   COLONOSCOPY N/A 04/20/2014   Procedure: COLONOSCOPY;  Surgeon: Jerrell KYM Sol, Melvin Hudson;  Location: [facility] ENDOSCOPY;  Service: Endoscopy;  Laterality: N/A;   COLONOSCOPY W/ BIOPSIES AND POLYPECTOMY     benign   DIRECT LARYNGOSCOPY  01/05/2014   DIRECT LARYNGOSCOPY N/A 01/05/2014   Procedure: DIRECT LARYNGOSCOPY AND BIOPSY;  Surgeon: Ida Loader, Melvin Hudson;  [facility];  Service: ENT;  Laterality: N/A;   Melvin DILATION N/A 01/01/2024   Procedure: DILATION, ESOPHAGUS;  Surgeon: Loader Ida, Melvin Hudson;  Location[facility] OR;  Service: ENT;  Laterality: N/A;   ESOPHAGOSCOPY WITH DILITATION N/A 05/27/2023   Procedure: ESOPHAGOSCOPY;  Surgeon: Loader Ida, Melvin Hudson;  [facility] OR;  Service: ENT;  Laterality: N/A;   ESOPHAGOSCOPY WITH DILITATION N/A 10/30/2023   Procedure: ESOPHAGOSCOPY, WITH BIOPSY;  Surgeon: Loader Ida, Melvin Hudson;  Location: [facility] OR;  Service: ENT;  Laterality: N/A;  With possible biopsy   IR GASTROSTOMY TUBE MOD SED  06/12/2023   IR IMAGING GUIDED PORT INSERTION  11/24/2023   IR RADIOLOGIST EVAL & MGMT  08/10/2023   IR RADIOLOGIST EVAL & MGMT  08/14/2023   KNEE ARTHROSCOPY Right ~ 1994   LEFT AND RIGHT HEART CATHETERIZATION WITH CORONARY/GRAFT ANGIOGRAM N/A 12/31/2010   Procedure: LEFT AND RIGHT HEART CATHETERIZATION WITH EL BILE;  Surgeon: Erick JONELLE Bergamo, Melvin Hudson;  Location: [facility] CATH LAB;  Service: Cardiovascular;  Laterality: N/A;    MULTIPLE EXTRACTIONS WITH ALVEOLOPLASTY N/A 07/08/2013   Procedure: Extraction of tooth #'s 22, 27 with alveoloplasty and bilateal mandibular facial exostoses reductions;  Surgeon: Tanda JULIANNA Fanny, DDS;  Location: [facility] ORS;  Service: Oral Surgery;  Laterality: N/A;   MULTIPLE TOOTH EXTRACTIONS  ~ 2008    I have reviewed the social history and family history with the patient and they are unchanged from previous note.  ALLERGIES:  is allergic to azilsartan and advil [ibuprofen].  MEDICATIONS:  Current Outpatient Medications  Medication Sig Dispense Refill   albuterol  (VENTOLIN  HFA) 108 (90 Base) MCG/ACT inhaler Inhale 2 puffs into the lungs every 4 (four) hours as needed for wheezing (or coughing).     allopurinol  (ZYLOPRIM ) 300 MG tablet Place 300 mg into feeding tube daily as needed (for gout flares).     apixaban  (ELIQUIS ) 5 MG TABS tablet Take 1 tablet (5 mg total) by mouth 2 (two) times daily. 60 tablet 1   atorvastatin  (LIPITOR) 40 MG tablet Take 1 tablet (40 mg total) by mouth at bedtime. (Patient taking differently: Place 40 mg into feeding tube at bedtime.) 90 tablet 1   bismuth subsalicylate (PEPTO BISMOL) 262 MG/15ML suspension Place 30 mLs into feeding tube every 6 (six) hours as needed  for indigestion or diarrhea or loose stools.     Blood Glucose Monitoring Suppl (ONETOUCH VERIO FLEX SYSTEM) w/Device KIT USE TO CHECK BLOOD SUGAR TWICE DAILY     calcium  carbonate (TUMS - DOSED IN MG ELEMENTAL CALCIUM ) 500 MG chewable tablet Chew 2 tablets (400 mg of elemental calcium  total) by mouth 3 (three) times daily with meals. (Patient taking differently: Place 400 mg of elemental calcium  into feeding tube daily as needed for indigestion or heartburn.) 180 tablet 1   carvedilol  (COREG ) 3.125 MG tablet Place 3.125 mg into feeding tube 2 (two) times daily with a meal.     cetirizine (ZYRTEC) 10 MG tablet Place 10 mg into feeding tube daily as needed for allergies or rhinitis.     clonazePAM   (KLONOPIN ) 1 MG disintegrating tablet Place 1 mg into feeding tube daily as needed (for anxiety).     dapagliflozin  propanediol (FARXIGA ) 10 MG TABS tablet 10 mg See admin instructions. 10 mg, per tube, once a day     diphenhydrAMINE  (BENADRYL ) 25 MG tablet Take 1 tablet (25 mg total) by mouth every 6 (six) hours. (Patient taking differently: Take 25 mg by mouth every 6 (six) hours as needed for itching.) 20 tablet 0   doxycycline  (VIBRA -TABS) 100 MG tablet Take 1 tablet (100 mg total) by mouth 2 (two) times daily. 14 tablet 0   famotidine  (PEPCID ) 40 MG/5ML suspension PLACE 2.5 MILLILITERS INTO THE FEEDING TUBE DAILY (Patient taking differently: Place 20 mg into feeding tube daily before breakfast.) 50 mL 1   gabapentin  (NEURONTIN ) 300 MG capsule Place 300 mg into feeding tube 2 (two) times daily. Feeding tube  2   guaiFENesin  (ROBITUSSIN) 100 MG/5ML liquid Place 15 mLs into feeding tube every 4 (four) hours as needed for cough or to loosen phlegm. 300 mL 0   hydrocortisone  (ANUSOL -HC) 2.5 % rectal cream Place 1 Application rectally daily as needed for hemorrhoids or anal itching.     ipratropium-albuterol  (DUONEB) 0.5-2.5 (3) MG/3ML SOLN Take 3 mLs by nebulization every 6 (six) hours as needed. 360 mL 2   isosorbide -hydrALAZINE  (BIDIL ) 20-37.5 MG tablet TAKE 1 TABLET BY MOUTH IN THE MORNING AND AT BEDTIME (Patient taking differently: Place 1 tablet into feeding tube in the morning and at bedtime.) 180 tablet 1   lidocaine -prilocaine  (EMLA ) cream Apply 1 Application topically as needed (as directed). 5 g 0   magic mouthwash (nystatin , lidocaine , diphenhydrAMINE , alum & mag hydroxide) suspension Swish and Swallow 5 mLs by mouth 4 (four) times daily as needed for mouth Hudson. Suspension contains equal amounts of Maalox Extra Strength, nystatin , diphenhydramine  and lidocaine . 140 mL 1   morphine  20 MG/5ML solution Take 2.5-5 mLs (10-20 mg total) by mouth every 4 (four) hours as needed for Hudson. 100 mL 0    Nutritional Supplements (NUTREN 1.5) LIQD Nutren 1.5 - give 2 cartons TID (1500 ml/day). Flush tube with 60 ml water  before and after each bolus + additional 200 ml free water  QID. Provides 2250 kcal, 102 g, 1146 ml free water  (2306 ml total water ). Meets >85% of needs.     ondansetron  (ZOFRAN ) 8 MG tablet Take 1 tablet (8 mg total) by mouth every 8 (eight) hours as needed for nausea or vomiting. (Patient taking differently: Place 8 mg into feeding tube every 8 (eight) hours as needed for nausea or vomiting.) 20 tablet 2   ONETOUCH VERIO test strip USE TO CHECK BLOOD SUGAR TWICE DAILY     polyethylene glycol (MIRALAX  / GLYCOLAX ) 17  g packet Take 17 g by mouth daily. (Patient taking differently: Place 17 g into feeding tube daily as needed for mild constipation or moderate constipation.) 14 each 0   prochlorperazine  (COMPAZINE ) 10 MG tablet Take 1 tablet (10 mg total) by mouth every 6 (six) hours as needed for nausea or vomiting. (Patient taking differently: Place 10 mg into feeding tube every 6 (six) hours as needed for nausea or vomiting.) 30 tablet 2   Protein (FEEDING SUPPLEMENT, PROSOURCE TF20,) liquid Place 60 mLs into feeding tube daily. 1800 mL 1   psyllium (HYDROCIL/METAMUCIL) 95 % PACK Take 1 packet by mouth daily. (Patient taking differently: Take 1 packet by mouth daily as needed for mild constipation or moderate constipation.) 240 each 0   Water  For Irrigation, Sterile (FREE WATER ) SOLN Place 100 mLs into feeding tube every 6 (six) hours.     No current facility-administered medications for this Hudson.   Facility-Administered Medications Ordered in Other Visits  Medication Dose Route Frequency Provider Last Rate Last Admin   dextrose  5 % solution   Intravenous Continuous Melvin Hudson, Melvin Hudson 10 mL/hr at 03/16/24 0935 New Bag at 03/16/24 0935   fluorouracil  (ADRUCIL ) 5,600 mg in sodium chloride  0.9 % 138 mL chemo infusion  2,400 mg/m2 (Treatment Plan Recorded) Intravenous 1 day or 1 dose Melvin Hudson,  Melvin Hudson       leucovorin  936 mg in dextrose  5 % 250 mL infusion  400 mg/m2 (Treatment Plan Recorded) Intravenous Once Melvin Hudson, Melvin Hudson 148 mL/hr at 03/16/24 1028 936 mg at 03/16/24 1028   oxaliplatin  (ELOXATIN ) 200 mg in dextrose  5 % 500 mL chemo infusion  85 mg/m2 (Treatment Plan Recorded) Intravenous Once Melvin Hudson, Melvin Hudson 270 mL/hr at 03/16/24 1031 200 mg at 03/16/24 1031   sodium chloride  flush (NS) 0.9 % injection 10 mL  10 mL Intracatheter PRN Melvin Hudson, Melvin Hudson        PHYSICAL EXAMINATION: ECOG PERFORMANCE STATUS: 2 - Symptomatic, <50% confined to bed  There were no vitals filed for this Hudson. Wt Readings from Last 3 Encounters:  03/16/24 218 lb 8 oz (99.1 kg)  03/05/24 229 lb 8 oz (104.1 kg)  03/03/24 229 lb 8 oz (104.1 kg)     GENERAL:alert, no distress and comfortable SKIN: skin color, texture, turgor are normal, no rashes or significant lesions EYES: normal, Conjunctiva are pink and non-injected, sclera clear NECK: supple, thyroid  normal size, non-tender, without nodularity LYMPH:  no palpable lymphadenopathy in the cervical, axillary  LUNGS: clear to auscultation and percussion with normal breathing effort HEART: regular rate & rhythm and no murmurs and no lower extremity edema ABDOMEN:abdomen soft, non-tender and normal bowel sounds Musculoskeletal:no cyanosis of digits and no clubbing  NEURO: alert & oriented x 3 with fluent speech, no focal motor/sensory deficits  Physical Exam MEASUREMENTS: Weight- 218.  LABORATORY DATA:  I have reviewed the data as listed    Latest Ref Rng & Units 03/16/2024    7:42 AM 03/06/2024    3:33 AM 03/05/2024   11:35 AM  CBC  WBC 4.0 - 10.5 K/uL 6.9  7.2  8.6   Hemoglobin 13.0 - 17.0 g/dL 89.0  89.2  88.5   Hematocrit 39.0 - 52.0 % 32.9  33.0  35.5   Platelets 150 - 400 K/uL 203  307  317         Latest Ref Rng & Units 03/16/2024    7:42 AM 03/06/2024    3:33 AM 03/05/2024   11:35 AM  CMP  Glucose 70 - 99 mg/dL 826  834  817   BUN 8 - 23 mg/dL  20  23  26    Creatinine 0.61 - 1.24 mg/dL 9.28  9.35  9.29   Sodium 135 - 145 mmol/L 147  141  143   Potassium 3.5 - 5.1 mmol/L 4.1  4.0  3.8   Chloride 98 - 111 mmol/L 109  104  103   CO2 22 - 32 mmol/L 30  32  30   Calcium  8.9 - 10.3 mg/dL 8.8  9.0  9.1   Total Protein 6.5 - 8.1 g/dL 6.9   7.6   Total Bilirubin 0.0 - 1.2 mg/dL 0.8   0.8   Alkaline Phos 38 - 126 U/L 122   129   AST 15 - 41 U/L 22   24   ALT 0 - 44 U/L 22   20       RADIOGRAPHIC STUDIES: I have personally reviewed the radiological images as listed and agreed with the findings in the report. No results found.    Orders Placed This Encounter  Procedures   CBC with Differential (Melvin Hudson Only)    Standing Status:   Future    Expected Date:   03/16/2024    Expiration Date:   03/16/2025   CMP (Melvin Hudson only)    Standing Status:   Future    Expected Date:   03/16/2024    Expiration Date:   03/16/2025   CBC with Differential (Melvin Hudson Only)    Standing Status:   Future    Expected Date:   03/30/2024    Expiration Date:   03/30/2025   CMP (Melvin Hudson only)    Standing Status:   Future    Expected Date:   03/30/2024    Expiration Date:   03/30/2025   CBC with Differential (Melvin Hudson Only)    Standing Status:   Future    Expected Date:   06/22/2024    Expiration Date:   06/22/2025   CMP (Melvin Hudson only)    Standing Status:   Future    Expected Date:   06/22/2024    Expiration Date:   06/22/2025   CBC with Differential (Melvin Hudson Only)    Standing Status:   Future    Expected Date:   07/06/2024    Expiration Date:   07/06/2025   CMP (Melvin Hudson only)    Standing Status:   Future    Expected Date:   07/06/2024    Expiration Date:   07/06/2025   CBC with Differential (Melvin Hudson Only)    Standing Status:   Future    Expected Date:   07/20/2024    Expiration Date:   07/20/2025   CMP (Melvin Hudson only)    Standing Status:   Future    Expected Date:   07/20/2024    Expiration Date:   07/20/2025    CBC with Differential (Melvin Hudson Only)    Standing Status:   Future    Expected Date:   08/03/2024    Expiration Date:   08/03/2025   CMP (Melvin Hudson only)    Standing Status:   Future    Expected Date:   08/03/2024    Expiration Date:   08/03/2025   CBC with Differential (Melvin Hudson Only)    Standing Status:   Future    Expected Date:   04/13/2024    Expiration Date:   04/13/2025   [facility]  only)    Standing Status:   Future    Expected Date:   04/13/2024    Expiration Date:   04/13/2025   CBC with Differential (Melvin Hudson Only)    Standing Status:   Future    Expected Date:   04/27/2024    Expiration Date:   04/27/2025   CMP (Melvin Hudson only)    Standing Status:   Future    Expected Date:   04/27/2024    Expiration Date:   04/27/2025   CBC with Differential (Melvin Hudson Only)    Standing Status:   Future    Expected Date:   05/11/2024    Expiration Date:   05/11/2025   CMP (Melvin Hudson only)    Standing Status:   Future    Expected Date:   05/11/2024    Expiration Date:   05/11/2025   CBC with Differential (Melvin Hudson Only)    Standing Status:   Future    Expected Date:   05/25/2024    Expiration Date:   05/25/2025   CMP (Melvin Hudson only)    Standing Status:   Future    Expected Date:   05/25/2024    Expiration Date:   05/25/2025   CBC with Differential (Melvin Hudson Only)    Standing Status:   Future    Expected Date:   06/08/2024    Expiration Date:   06/08/2025   CMP (Melvin Hudson only)    Standing Status:   Future    Expected Date:   06/08/2024    Expiration Date:   06/08/2025   All questions were answered. The patient knows to call the clinic with any problems, questions or concerns. No barriers to learning was detected. The total time spent in the appointment was 40 minutes, including review of chart and various tests results, discussions about plan of care and coordination of care plan     Onita Mattock, Melvin Hudson 03/16/2024     "

## 2024-03-16 NOTE — Progress Notes (Signed)
 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 He completed chemotherapy and radiation (08/03/23). Currently receiving immunotherapy with nivolumab . Recent imaging revealed progression at primary site. Planning to change to Folfox + Tevimbra.    DME: [facility]  1/17-1/18 - admission with acute respiratory failure with hypoxia  12/20-12/22 admission with oral bleeding     Met with patient in infusion. He reports feeling the best he has in a while over the last few days. Patient reports nebulizer machine received at last hospital discharge has made a big difference. He is adhering to NPO. Patient reports tolerating tube feedings at goal (nutren 1.5 x6 cartons/day). He has continued to use daily protein modular (80 kcal, 20g) and asking for additional samples as he is waiting on shipment. This provides 2330 kcal, 122g protein and meets 97% estimated calories and >100% of protein estimated needs. Despite this, weights are trending down. Patient asking if he can use the Boost VHC in tube. Has a case of this at home and does not want it to go bad.   Medications: reviewed   Labs: Na 147, glucose 173  Anthropometrics: Wt 218 lb 8 oz today - decreased 4.8% in the last 3 weeks which is severe for time frame  1/6 - 229 lb 6.4 oz  12/29 - 233 lb 8 oz   Estimated Energy Needs  Kcals: 2400-2700 Protein: 120-135 Fluid: >2.4 L  NUTRITION DIAGNOSIS: Unintended wt loss continues - addressing with TF   INTERVENTION:  Will update regimen to include Boost VHC for added calories Give 5 cartons Nutren 1.5 + 1 carton Boost VHC split over four feedings/day Continue Prosource TF once daily Regimen provides 2485 kcal, 127 g protein which meets 100% of estimated needs Continue NPO, diet advancement per SLP   MONITORING, EVALUATION, GOAL: wt trends, TF   NEXT VISIT: Thursday February 12 during infusion

## 2024-03-18 ENCOUNTER — Other Ambulatory Visit: Payer: Self-pay

## 2024-03-18 ENCOUNTER — Inpatient Hospital Stay

## 2024-03-18 ENCOUNTER — Telehealth: Payer: Self-pay

## 2024-03-18 ENCOUNTER — Ambulatory Visit: Attending: Radiation Oncology

## 2024-03-18 ENCOUNTER — Other Ambulatory Visit: Payer: Self-pay | Admitting: Hematology

## 2024-03-18 ENCOUNTER — Encounter: Payer: Self-pay | Admitting: Hematology

## 2024-03-18 ENCOUNTER — Inpatient Hospital Stay: Admitting: Licensed Clinical Social Worker

## 2024-03-18 DIAGNOSIS — C153 Malignant neoplasm of upper third of esophagus: Secondary | ICD-10-CM

## 2024-03-18 DIAGNOSIS — R1312 Dysphagia, oropharyngeal phase: Secondary | ICD-10-CM | POA: Diagnosis present

## 2024-03-18 NOTE — Progress Notes (Signed)
 [facility] CSW Progress Note  Clinical Child Psychotherapist contacted patient by phone to follow-up on financial concerns.    Interventions: Pt referred to Cancer Services for additional financial support.  Pt provided w/ contact information for the [employer] via MyChart.        Follow Up Plan:  Patient will contact CSW with any support or resource needs    Melvin JONELLE Manna, LCSW Clinical Social Worker [facility]

## 2024-03-18 NOTE — Patient Instructions (Addendum)
" °  Water  protocol handout provided   SWALLOWING EXERCISES Do these 5-6 days/week for 8-10 weeks, then 2 days per week afterwards You can use 1-2 drops of liquid to help you swallow, if your mouth gets dry DO GOOD ORAL CARE BEFORE YOU DO THESE!!  Effortful Swallows - Press your tongue against the roof of your mouth for 3 seconds, then swallow as hard as you can - Do at least 30 reps/day - start with a goal of 20/day for the first 1-2 weeks  Mendelsohn Maneuver - squeeze swallow exercise - Swallow, and squeeze tight to keep your Adam's Apple up - Hold the squeeze for 5-7 seconds - then relax - Do at least 30 reps/day - start with a goal of 20/day for the first 1-2 weeks  3.   Chin tuck - Place a rolled up towel (4 inches wide) under your chin near your neck - Tuck your chin and push hard on the towel for 5 seconds - Do at least 30 reps/day - start with a goal of 20/day for the first 1-2 weeks       "

## 2024-03-18 NOTE — Telephone Encounter (Signed)
-----   Message from Onita Mattock, MD sent at 03/18/2024 12:56 PM EST ----- Anders, please order TSH and free T4 every 6 weeks, thx ----- Message ----- From: Viktoria Wilma SQUIBB, [facility] Sent: 03/18/2024  11:50 AM EST To: Onita Mattock, MD  Dr Mattock, Pt will need TSH monitored at baseline & every 3rd cycle w/ Tislelizumab.  Thanks, GT ----- Message ----- From: Mattock Onita, MD Sent: 03/16/2024  11:39 AM EST To: Wilma SQUIBB Viktoria, RPH; [place] [facility]  Please submit his chemo and Tevimbra for insurance approval, plan to start in 2 weeks, please keep me and pharmacy posted, thx  Onita Mattock

## 2024-03-18 NOTE — Addendum Note (Signed)
 Addended by: Demitria Hay P on: 03/18/2024 11:48 AM   Modules accepted: Orders

## 2024-03-18 NOTE — Telephone Encounter (Signed)
 Verbal order w/readback from Dr Lanny for TSH and T4 free both to be drawn every 6wks.  Orders placed.

## 2024-03-20 ENCOUNTER — Other Ambulatory Visit: Payer: Self-pay

## 2024-03-21 ENCOUNTER — Inpatient Hospital Stay: Admitting: Hematology

## 2024-03-21 ENCOUNTER — Inpatient Hospital Stay

## 2024-03-24 ENCOUNTER — Encounter: Payer: Self-pay | Admitting: Hematology

## 2024-03-31 ENCOUNTER — Inpatient Hospital Stay

## 2024-03-31 ENCOUNTER — Inpatient Hospital Stay: Admitting: Dietician

## 2024-03-31 ENCOUNTER — Inpatient Hospital Stay: Admitting: Nurse Practitioner

## 2024-03-31 ENCOUNTER — Inpatient Hospital Stay: Attending: Hematology

## 2024-04-02 ENCOUNTER — Inpatient Hospital Stay

## 2024-04-14 ENCOUNTER — Inpatient Hospital Stay: Admitting: Dietician

## 2024-04-14 ENCOUNTER — Inpatient Hospital Stay

## 2024-04-14 ENCOUNTER — Inpatient Hospital Stay: Admitting: Hematology

## 2024-04-16 ENCOUNTER — Inpatient Hospital Stay

## 2024-04-28 ENCOUNTER — Inpatient Hospital Stay

## 2024-04-28 ENCOUNTER — Inpatient Hospital Stay: Attending: Hematology | Admitting: Dietician

## 2024-04-28 ENCOUNTER — Inpatient Hospital Stay: Admitting: Hematology

## 2024-04-30 ENCOUNTER — Inpatient Hospital Stay

## 2024-05-13 ENCOUNTER — Ambulatory Visit
